# Patient Record
Sex: Male | Born: 1940 | Race: White | Marital: Married | Smoking: Former smoker
Health system: Southern US, Community
[De-identification: ages and names within clinical notes are randomized; demographics above are authoritative.]

## PROBLEM LIST (undated history)

## (undated) DIAGNOSIS — K2270 Barrett's esophagus without dysplasia: Secondary | ICD-10-CM

## (undated) DIAGNOSIS — D759 Disease of blood and blood-forming organs, unspecified: Secondary | ICD-10-CM

## (undated) DIAGNOSIS — M5136 Other intervertebral disc degeneration, lumbar region: Secondary | ICD-10-CM

## (undated) DIAGNOSIS — R519 Headache, unspecified: Secondary | ICD-10-CM

## (undated) DIAGNOSIS — E349 Endocrine disorder, unspecified: Secondary | ICD-10-CM

## (undated) DIAGNOSIS — R51 Headache: Secondary | ICD-10-CM

## (undated) DIAGNOSIS — R011 Cardiac murmur, unspecified: Secondary | ICD-10-CM

## (undated) DIAGNOSIS — I2510 Atherosclerotic heart disease of native coronary artery without angina pectoris: Secondary | ICD-10-CM

## (undated) DIAGNOSIS — R0989 Other specified symptoms and signs involving the circulatory and respiratory systems: Secondary | ICD-10-CM

## (undated) DIAGNOSIS — M1990 Unspecified osteoarthritis, unspecified site: Secondary | ICD-10-CM

## (undated) DIAGNOSIS — M75100 Unspecified rotator cuff tear or rupture of unspecified shoulder, not specified as traumatic: Secondary | ICD-10-CM

## (undated) DIAGNOSIS — K279 Peptic ulcer, site unspecified, unspecified as acute or chronic, without hemorrhage or perforation: Secondary | ICD-10-CM

## (undated) DIAGNOSIS — M51369 Other intervertebral disc degeneration, lumbar region without mention of lumbar back pain or lower extremity pain: Secondary | ICD-10-CM

## (undated) DIAGNOSIS — I10 Essential (primary) hypertension: Secondary | ICD-10-CM

## (undated) DIAGNOSIS — E785 Hyperlipidemia, unspecified: Secondary | ICD-10-CM

## (undated) DIAGNOSIS — K219 Gastro-esophageal reflux disease without esophagitis: Secondary | ICD-10-CM

## (undated) DIAGNOSIS — Z8719 Personal history of other diseases of the digestive system: Secondary | ICD-10-CM

## (undated) DIAGNOSIS — M5126 Other intervertebral disc displacement, lumbar region: Secondary | ICD-10-CM

## (undated) HISTORY — DX: Other intervertebral disc displacement, lumbar region: M51.26

## (undated) HISTORY — DX: Essential (primary) hypertension: I10

## (undated) HISTORY — DX: Other specified symptoms and signs involving the circulatory and respiratory systems: R09.89

## (undated) HISTORY — PX: BACK SURGERY: SHX140

## (undated) HISTORY — DX: Peptic ulcer, site unspecified, unspecified as acute or chronic, without hemorrhage or perforation: K27.9

## (undated) HISTORY — DX: Hyperlipidemia, unspecified: E78.5

## (undated) HISTORY — DX: Atherosclerotic heart disease of native coronary artery without angina pectoris: I25.10

## (undated) HISTORY — DX: Barrett's esophagus without dysplasia: K22.70

## (undated) HISTORY — PX: SPINAL FUSION: SHX223

## (undated) HISTORY — DX: Gastro-esophageal reflux disease without esophagitis: K21.9

## (undated) HISTORY — DX: Other intervertebral disc degeneration, lumbar region without mention of lumbar back pain or lower extremity pain: M51.369

## (undated) HISTORY — DX: Unspecified rotator cuff tear or rupture of unspecified shoulder, not specified as traumatic: M75.100

## (undated) HISTORY — DX: Other intervertebral disc degeneration, lumbar region: M51.36

## (undated) HISTORY — PX: ROTATOR CUFF REPAIR: SHX139

## (undated) HISTORY — DX: Endocrine disorder, unspecified: E34.9

## (undated) HISTORY — DX: Unspecified osteoarthritis, unspecified site: M19.90

## (undated) HISTORY — PX: JOINT REPLACEMENT: SHX530

## (undated) HISTORY — PX: CERVICAL FUSION: SHX112

---

## 1988-01-23 HISTORY — PX: KNEE SURGERY: SHX244

## 2002-04-22 ENCOUNTER — Inpatient Hospital Stay (HOSPITAL_COMMUNITY): Admission: RE | Admit: 2002-04-22 | Discharge: 2002-04-23 | Payer: Self-pay | Admitting: Neurosurgery

## 2002-09-22 ENCOUNTER — Encounter: Payer: Self-pay | Admitting: Gastroenterology

## 2002-09-22 DIAGNOSIS — Z9189 Other specified personal risk factors, not elsewhere classified: Secondary | ICD-10-CM

## 2003-02-25 ENCOUNTER — Inpatient Hospital Stay (HOSPITAL_COMMUNITY): Admission: RE | Admit: 2003-02-25 | Discharge: 2003-03-01 | Payer: Self-pay | Admitting: Neurosurgery

## 2003-06-08 ENCOUNTER — Observation Stay (HOSPITAL_COMMUNITY): Admission: RE | Admit: 2003-06-08 | Discharge: 2003-06-09 | Payer: Self-pay | Admitting: Orthopedic Surgery

## 2004-02-01 ENCOUNTER — Ambulatory Visit: Payer: Self-pay | Admitting: Internal Medicine

## 2004-04-03 ENCOUNTER — Ambulatory Visit: Payer: Self-pay | Admitting: Internal Medicine

## 2004-08-03 ENCOUNTER — Ambulatory Visit: Payer: Self-pay | Admitting: Internal Medicine

## 2004-11-07 ENCOUNTER — Ambulatory Visit: Payer: Self-pay | Admitting: Internal Medicine

## 2004-11-27 ENCOUNTER — Ambulatory Visit: Payer: Self-pay | Admitting: Internal Medicine

## 2005-03-26 ENCOUNTER — Ambulatory Visit: Payer: Self-pay | Admitting: Internal Medicine

## 2005-04-02 ENCOUNTER — Ambulatory Visit: Payer: Self-pay | Admitting: Internal Medicine

## 2005-07-26 ENCOUNTER — Ambulatory Visit: Payer: Self-pay | Admitting: Internal Medicine

## 2006-01-28 ENCOUNTER — Ambulatory Visit: Payer: Self-pay | Admitting: Internal Medicine

## 2006-01-28 LAB — CONVERTED CEMR LAB
ALT: 31 units/L (ref 0–40)
Creatinine, Ser: 1 mg/dL (ref 0.4–1.5)
HDL: 38.6 mg/dL — ABNORMAL LOW (ref >39.0)
LDL Cholesterol: 71 mg/dL (ref 0–99)
Triglyceride fasting, serum: 129 mg/dL (ref 0–149)
VLDL: 26 mg/dL (ref 0–40)

## 2006-05-03 DIAGNOSIS — I2510 Atherosclerotic heart disease of native coronary artery without angina pectoris: Secondary | ICD-10-CM

## 2006-05-03 DIAGNOSIS — E785 Hyperlipidemia, unspecified: Secondary | ICD-10-CM | POA: Insufficient documentation

## 2006-05-03 DIAGNOSIS — I10 Essential (primary) hypertension: Secondary | ICD-10-CM

## 2006-05-03 HISTORY — DX: Atherosclerotic heart disease of native coronary artery without angina pectoris: I25.10

## 2006-05-03 HISTORY — DX: Essential (primary) hypertension: I10

## 2006-05-03 HISTORY — DX: Hyperlipidemia, unspecified: E78.5

## 2006-07-22 ENCOUNTER — Ambulatory Visit: Payer: Self-pay | Admitting: Internal Medicine

## 2006-07-22 DIAGNOSIS — R5381 Other malaise: Secondary | ICD-10-CM

## 2006-07-22 DIAGNOSIS — M549 Dorsalgia, unspecified: Secondary | ICD-10-CM

## 2006-07-22 DIAGNOSIS — R5383 Other fatigue: Secondary | ICD-10-CM

## 2006-07-22 LAB — CONVERTED CEMR LAB
Bilirubin Urine: NEGATIVE
Ketones, urine, test strip: NEGATIVE
Nitrite: NEGATIVE
Protein, U semiquant: NEGATIVE
Urobilinogen, UA: NEGATIVE

## 2006-07-23 ENCOUNTER — Telehealth (INDEPENDENT_AMBULATORY_CARE_PROVIDER_SITE_OTHER): Payer: Self-pay | Admitting: *Deleted

## 2006-07-25 ENCOUNTER — Encounter: Admission: RE | Admit: 2006-07-25 | Discharge: 2006-07-25 | Payer: Self-pay | Admitting: Internal Medicine

## 2006-07-29 LAB — CONVERTED CEMR LAB
ALT: 27 units/L (ref 0–53)
AST: 31 units/L (ref 0–37)
Folate: 15.6 ng/mL
PSA: 0.6 ng/mL (ref 0.10–4.00)
TSH: 1.34 microintl units/mL (ref 0.35–5.50)
Vitamin B-12: 256 pg/mL (ref 211–911)

## 2006-08-16 ENCOUNTER — Encounter: Admission: RE | Admit: 2006-08-16 | Discharge: 2006-08-16 | Payer: Self-pay | Admitting: Neurosurgery

## 2006-08-21 ENCOUNTER — Encounter: Payer: Self-pay | Admitting: Internal Medicine

## 2006-11-29 ENCOUNTER — Encounter: Payer: Self-pay | Admitting: Internal Medicine

## 2006-12-04 ENCOUNTER — Encounter: Payer: Self-pay | Admitting: Internal Medicine

## 2007-01-20 ENCOUNTER — Ambulatory Visit: Payer: Self-pay | Admitting: Internal Medicine

## 2007-01-21 LAB — CONVERTED CEMR LAB
ALT: 27 units/L (ref 0–53)
AST: 19 units/L (ref 0–37)
BUN: 18 mg/dL (ref 6–23)
CO2: 30 meq/L (ref 19–32)
Calcium: 9.8 mg/dL (ref 8.4–10.5)
Creatinine, Ser: 1 mg/dL (ref 0.4–1.5)
Potassium: 4.4 meq/L (ref 3.5–5.1)
Triglycerides: 83 mg/dL (ref 0–149)
VLDL: 17 mg/dL (ref 0–40)

## 2007-03-25 ENCOUNTER — Telehealth (INDEPENDENT_AMBULATORY_CARE_PROVIDER_SITE_OTHER): Payer: Self-pay | Admitting: *Deleted

## 2007-05-30 ENCOUNTER — Encounter: Payer: Self-pay | Admitting: Internal Medicine

## 2007-07-07 ENCOUNTER — Ambulatory Visit: Payer: Self-pay | Admitting: Internal Medicine

## 2007-07-07 DIAGNOSIS — R319 Hematuria, unspecified: Secondary | ICD-10-CM

## 2007-07-09 ENCOUNTER — Encounter (INDEPENDENT_AMBULATORY_CARE_PROVIDER_SITE_OTHER): Payer: Self-pay | Admitting: *Deleted

## 2007-07-10 ENCOUNTER — Encounter: Payer: Self-pay | Admitting: Internal Medicine

## 2007-08-25 ENCOUNTER — Ambulatory Visit: Payer: Self-pay | Admitting: Gastroenterology

## 2007-08-29 ENCOUNTER — Telehealth: Payer: Self-pay | Admitting: Gastroenterology

## 2008-01-06 ENCOUNTER — Ambulatory Visit: Payer: Self-pay | Admitting: Internal Medicine

## 2008-01-09 LAB — CONVERTED CEMR LAB
Cholesterol: 129 mg/dL (ref 0–200)
Direct LDL: 57.2 mg/dL
Total CHOL/HDL Ratio: 4.7
Triglycerides: 219 mg/dL (ref 0–149)

## 2008-03-23 ENCOUNTER — Telehealth (INDEPENDENT_AMBULATORY_CARE_PROVIDER_SITE_OTHER): Payer: Self-pay | Admitting: *Deleted

## 2008-04-05 ENCOUNTER — Ambulatory Visit: Payer: Self-pay | Admitting: Internal Medicine

## 2008-04-05 DIAGNOSIS — M25519 Pain in unspecified shoulder: Secondary | ICD-10-CM | POA: Insufficient documentation

## 2008-05-31 ENCOUNTER — Ambulatory Visit: Payer: Self-pay | Admitting: Gastroenterology

## 2008-06-09 ENCOUNTER — Encounter: Payer: Self-pay | Admitting: Gastroenterology

## 2008-06-09 ENCOUNTER — Ambulatory Visit: Payer: Self-pay | Admitting: Gastroenterology

## 2008-06-11 ENCOUNTER — Encounter: Payer: Self-pay | Admitting: Gastroenterology

## 2008-06-17 ENCOUNTER — Ambulatory Visit: Payer: Self-pay | Admitting: Gastroenterology

## 2008-07-19 ENCOUNTER — Ambulatory Visit: Payer: Self-pay | Admitting: Gastroenterology

## 2008-07-22 ENCOUNTER — Ambulatory Visit: Payer: Self-pay | Admitting: Internal Medicine

## 2008-07-22 DIAGNOSIS — K279 Peptic ulcer, site unspecified, unspecified as acute or chronic, without hemorrhage or perforation: Secondary | ICD-10-CM

## 2008-07-22 HISTORY — DX: Peptic ulcer, site unspecified, unspecified as acute or chronic, without hemorrhage or perforation: K27.9

## 2008-07-27 ENCOUNTER — Telehealth (INDEPENDENT_AMBULATORY_CARE_PROVIDER_SITE_OTHER): Payer: Self-pay | Admitting: *Deleted

## 2008-08-13 DIAGNOSIS — K5730 Diverticulosis of large intestine without perforation or abscess without bleeding: Secondary | ICD-10-CM | POA: Insufficient documentation

## 2008-08-18 ENCOUNTER — Ambulatory Visit: Payer: Self-pay | Admitting: Gastroenterology

## 2008-08-18 DIAGNOSIS — K2270 Barrett's esophagus without dysplasia: Secondary | ICD-10-CM

## 2008-08-18 HISTORY — DX: Barrett's esophagus without dysplasia: K22.70

## 2008-08-19 ENCOUNTER — Encounter: Payer: Self-pay | Admitting: Gastroenterology

## 2008-08-19 ENCOUNTER — Telehealth: Payer: Self-pay | Admitting: Gastroenterology

## 2008-08-26 ENCOUNTER — Ambulatory Visit: Payer: Self-pay | Admitting: Internal Medicine

## 2008-08-27 LAB — CONVERTED CEMR LAB
Basophils Relative: 0.1 % (ref 0.0–3.0)
CO2: 26 meq/L (ref 19–32)
Calcium: 9.8 mg/dL (ref 8.4–10.5)
Chloride: 103 meq/L (ref 96–112)
Eosinophils Absolute: 0.1 10*3/uL (ref 0.0–0.7)
HCT: 34.2 % — ABNORMAL LOW (ref 39.0–52.0)
Hemoglobin: 12 g/dL — ABNORMAL LOW (ref 13.0–17.0)
Lymphocytes Relative: 37.5 % (ref 12.0–46.0)
Lymphs Abs: 1.7 10*3/uL (ref 0.7–4.0)
MCHC: 35.2 g/dL (ref 30.0–36.0)
Neutro Abs: 2.4 10*3/uL (ref 1.4–7.7)
Potassium: 3.9 meq/L (ref 3.5–5.1)
RBC: 3.52 M/uL — ABNORMAL LOW (ref 4.22–5.81)
Sodium: 142 meq/L (ref 135–145)

## 2008-09-06 ENCOUNTER — Telehealth: Payer: Self-pay | Admitting: Gastroenterology

## 2008-10-04 ENCOUNTER — Ambulatory Visit: Payer: Self-pay | Admitting: Gastroenterology

## 2008-10-07 ENCOUNTER — Encounter: Payer: Self-pay | Admitting: Internal Medicine

## 2008-10-07 ENCOUNTER — Ambulatory Visit: Payer: Self-pay | Admitting: Internal Medicine

## 2009-01-04 ENCOUNTER — Ambulatory Visit: Payer: Self-pay | Admitting: Internal Medicine

## 2009-04-04 ENCOUNTER — Telehealth (INDEPENDENT_AMBULATORY_CARE_PROVIDER_SITE_OTHER): Payer: Self-pay | Admitting: *Deleted

## 2009-04-06 ENCOUNTER — Ambulatory Visit: Payer: Self-pay | Admitting: Internal Medicine

## 2009-04-12 ENCOUNTER — Encounter: Admission: RE | Admit: 2009-04-12 | Discharge: 2009-04-12 | Payer: Self-pay | Admitting: Orthopedic Surgery

## 2009-04-13 ENCOUNTER — Ambulatory Visit: Payer: Self-pay | Admitting: Hematology & Oncology

## 2009-04-14 ENCOUNTER — Ambulatory Visit (HOSPITAL_BASED_OUTPATIENT_CLINIC_OR_DEPARTMENT_OTHER): Admission: RE | Admit: 2009-04-14 | Discharge: 2009-04-14 | Payer: Self-pay | Admitting: Hematology & Oncology

## 2009-04-14 ENCOUNTER — Encounter: Payer: Self-pay | Admitting: Internal Medicine

## 2009-04-14 ENCOUNTER — Ambulatory Visit: Payer: Self-pay | Admitting: Diagnostic Radiology

## 2009-04-14 ENCOUNTER — Ambulatory Visit: Payer: Self-pay

## 2009-04-14 DIAGNOSIS — R0989 Other specified symptoms and signs involving the circulatory and respiratory systems: Secondary | ICD-10-CM

## 2009-04-14 LAB — CONVERTED CEMR LAB
ALT: 33 units/L (ref 0–53)
AST: 34 units/L (ref 0–37)
BUN: 19 mg/dL (ref 6–23)
Basophils Absolute: 0 10*3/uL (ref 0.0–0.1)
Bilirubin, Direct: 0.1 mg/dL (ref 0.0–0.3)
Chloride: 107 meq/L (ref 96–112)
GFR calc non Af Amer: 88.52 mL/min (ref >60)
Glucose, Bld: 103 mg/dL — ABNORMAL HIGH (ref 70–99)
HCT: 33.5 % — ABNORMAL LOW (ref 39.0–52.0)
Lymphs Abs: 1.6 10*3/uL (ref 0.7–4.0)
MCV: 100.9 fL — ABNORMAL HIGH (ref 78.0–100.0)
Monocytes Absolute: 0.5 10*3/uL (ref 0.1–1.0)
Platelets: 183 10*3/uL (ref 150.0–400.0)
Potassium: 4.7 meq/L (ref 3.5–5.1)
RDW: 13.7 % (ref 11.5–14.6)
TSH: 1.3 microintl units/mL (ref 0.35–5.50)
Total Bilirubin: 0.2 mg/dL — ABNORMAL LOW (ref 0.3–1.2)

## 2009-04-14 LAB — CBC WITH DIFFERENTIAL (CANCER CENTER ONLY)
BASO%: 0.5 % (ref 0.0–2.0)
Eosinophils Absolute: 0.1 10*3/uL (ref 0.0–0.5)
MONO#: 0.3 10*3/uL (ref 0.1–0.9)
MONO%: 7.7 % (ref 0.0–13.0)
NEUT#: 2.3 10*3/uL (ref 1.5–6.5)
Platelets: 203 10*3/uL (ref 145–400)
RBC: 3.4 10*6/uL — ABNORMAL LOW (ref 4.20–5.70)
RDW: 11.1 % (ref 10.5–14.6)
WBC: 4.2 10*3/uL (ref 4.0–10.0)

## 2009-04-14 LAB — CHCC SATELLITE - SMEAR

## 2009-04-15 ENCOUNTER — Telehealth: Payer: Self-pay | Admitting: Internal Medicine

## 2009-04-15 DIAGNOSIS — C9000 Multiple myeloma not having achieved remission: Secondary | ICD-10-CM

## 2009-04-18 LAB — COMPREHENSIVE METABOLIC PANEL
ALT: 26 U/L (ref 0–53)
AST: 25 U/L (ref 0–37)
CO2: 23 mEq/L (ref 19–32)
Chloride: 103 mEq/L (ref 96–112)
Creatinine, Ser: 0.93 mg/dL (ref 0.40–1.50)
Sodium: 139 mEq/L (ref 135–145)
Total Bilirubin: 0.3 mg/dL (ref 0.3–1.2)
Total Protein: 8.7 g/dL — ABNORMAL HIGH (ref 6.0–8.3)

## 2009-04-18 LAB — LACTATE DEHYDROGENASE: LDH: 106 U/L (ref 94–250)

## 2009-04-18 LAB — KAPPA/LAMBDA LIGHT CHAINS
Kappa:Lambda Ratio: 6.71 — ABNORMAL HIGH (ref 0.26–1.65)
Lambda Free Lght Chn: 0.75 mg/dL (ref 0.57–2.63)

## 2009-04-18 LAB — SPEP & IFE WITH QIG
Alpha-1-Globulin: 3.9 % (ref 2.9–4.9)
Beta 2: 34.8 % — ABNORMAL HIGH (ref 3.2–6.5)
Gamma Globulin: 7.6 % — ABNORMAL LOW (ref 11.1–18.8)
IgA: 4480 mg/dL — ABNORMAL HIGH (ref 68–378)
IgG (Immunoglobin G), Serum: 330 mg/dL — ABNORMAL LOW (ref 694–1618)
IgM, Serum: 9 mg/dL — ABNORMAL LOW (ref 60–263)
M-Spike, %: 2.73 g/dL

## 2009-04-19 ENCOUNTER — Ambulatory Visit (HOSPITAL_COMMUNITY): Admission: RE | Admit: 2009-04-19 | Discharge: 2009-04-19 | Payer: Self-pay | Admitting: Hematology & Oncology

## 2009-04-19 ENCOUNTER — Ambulatory Visit: Payer: Self-pay | Admitting: Hematology & Oncology

## 2009-04-19 ENCOUNTER — Encounter: Payer: Self-pay | Admitting: Internal Medicine

## 2009-04-22 LAB — UIFE/LIGHT CHAINS/TP QN, 24-HR UR
Alpha 2, Urine: DETECTED — AB
Beta, Urine: DETECTED — AB
Free Kappa Lt Chains,Ur: 3.61 mg/dL — ABNORMAL HIGH (ref 0.04–1.51)
Free Lambda Lt Chains,Ur: <0.05 mg/dL — ABNORMAL LOW (ref 0.08–1.01)

## 2009-04-28 LAB — BASIC METABOLIC PANEL
CO2: 23 mEq/L (ref 19–32)
Chloride: 102 mEq/L (ref 96–112)
Creatinine, Ser: 0.89 mg/dL (ref 0.40–1.50)
Potassium: 4.2 mEq/L (ref 3.5–5.3)

## 2009-05-02 ENCOUNTER — Ambulatory Visit (HOSPITAL_BASED_OUTPATIENT_CLINIC_OR_DEPARTMENT_OTHER): Admission: RE | Admit: 2009-05-02 | Discharge: 2009-05-02 | Payer: Self-pay | Admitting: Hematology & Oncology

## 2009-05-02 ENCOUNTER — Ambulatory Visit: Payer: Self-pay | Admitting: Interventional Radiology

## 2009-05-05 LAB — CBC WITH DIFFERENTIAL (CANCER CENTER ONLY)
BASO#: 0.1 10*3/uL (ref 0.0–0.2)
EOS%: 3.1 % (ref 0.0–7.0)
HCT: 31.3 % — ABNORMAL LOW (ref 38.7–49.9)
HGB: 10.6 g/dL — ABNORMAL LOW (ref 13.0–17.1)
LYMPH%: 33.6 % (ref 14.0–48.0)
MCH: 33.1 pg (ref 28.0–33.4)
MCHC: 33.7 g/dL (ref 32.0–35.9)
MCV: 98 fL (ref 82–98)
MONO%: 10.2 % (ref 0.0–13.0)
NEUT#: 2.9 10*3/uL (ref 1.5–6.5)
NEUT%: 52.2 % (ref 40.0–80.0)

## 2009-05-12 LAB — CBC WITH DIFFERENTIAL (CANCER CENTER ONLY)
EOS%: 3.8 % (ref 0.0–7.0)
LYMPH%: 18.2 % (ref 14.0–48.0)
MCH: 33.2 pg (ref 28.0–33.4)
MCHC: 34 g/dL (ref 32.0–35.9)
MCV: 98 fL (ref 82–98)
MONO%: 7.5 % (ref 0.0–13.0)
NEUT#: 4.9 10*3/uL (ref 1.5–6.5)
Platelets: 224 10*3/uL (ref 145–400)
RDW: 11.2 % (ref 10.5–14.6)

## 2009-05-25 ENCOUNTER — Ambulatory Visit: Payer: Self-pay | Admitting: Hematology & Oncology

## 2009-05-26 LAB — CBC WITH DIFFERENTIAL (CANCER CENTER ONLY)
BASO%: 0.5 % (ref 0.0–2.0)
EOS%: 3.5 % (ref 0.0–7.0)
HCT: 31.4 % — ABNORMAL LOW (ref 38.7–49.9)
LYMPH%: 29.2 % (ref 14.0–48.0)
MCHC: 34.3 g/dL (ref 32.0–35.9)
MCV: 97 fL (ref 82–98)
NEUT%: 53.7 % (ref 40.0–80.0)
Platelets: 145 10*3/uL (ref 145–400)
RDW: 11.7 % (ref 10.5–14.6)

## 2009-05-30 LAB — IGG, IGA, IGM
IgG (Immunoglobin G), Serum: 319 mg/dL — ABNORMAL LOW (ref 694–1618)
IgM, Serum: 10 mg/dL — ABNORMAL LOW (ref 60–263)

## 2009-05-30 LAB — BETA 2 MICROGLOBULIN, SERUM: Beta-2 Microglobulin: 2.27 mg/L — ABNORMAL HIGH (ref 1.01–1.73)

## 2009-05-30 LAB — PROTEIN ELECTROPHORESIS, SERUM
Albumin ELP: 45.7 % — ABNORMAL LOW (ref 55.8–66.1)
Beta 2: 27.4 % — ABNORMAL HIGH (ref 3.2–6.5)
Gamma Globulin: 6.8 % — ABNORMAL LOW (ref 11.1–18.8)

## 2009-05-30 LAB — COMPREHENSIVE METABOLIC PANEL
AST: 23 U/L (ref 0–37)
Albumin: 3.9 g/dL (ref 3.5–5.2)
Alkaline Phosphatase: 62 U/L (ref 39–117)
BUN: 13 mg/dL (ref 6–23)
Creatinine, Ser: 0.85 mg/dL (ref 0.40–1.50)
Glucose, Bld: 103 mg/dL — ABNORMAL HIGH (ref 70–99)
Potassium: 4.2 mEq/L (ref 3.5–5.3)

## 2009-05-30 LAB — KAPPA/LAMBDA LIGHT CHAINS
Kappa:Lambda Ratio: 6.91 — ABNORMAL HIGH (ref 0.26–1.65)
Lambda Free Lght Chn: 0.68 mg/dL (ref 0.57–2.63)

## 2009-06-02 LAB — CBC WITH DIFFERENTIAL (CANCER CENTER ONLY)
BASO%: 0.7 % (ref 0.0–2.0)
EOS%: 3 % (ref 0.0–7.0)
LYMPH#: 1.3 10*3/uL (ref 0.9–3.3)
MCHC: 33.5 g/dL (ref 32.0–35.9)
NEUT#: 1.5 10*3/uL (ref 1.5–6.5)
Platelets: 180 10*3/uL (ref 145–400)
RDW: 11.5 % (ref 10.5–14.6)

## 2009-06-09 LAB — CBC WITH DIFFERENTIAL (CANCER CENTER ONLY)
BASO#: 0 10*3/uL (ref 0.0–0.2)
EOS%: 4.1 % (ref 0.0–7.0)
Eosinophils Absolute: 0.2 10*3/uL (ref 0.0–0.5)
HGB: 11 g/dL — ABNORMAL LOW (ref 13.0–17.1)
LYMPH#: 1.3 10*3/uL (ref 0.9–3.3)
LYMPH%: 31.5 % (ref 14.0–48.0)
MCHC: 34.2 g/dL (ref 32.0–35.9)
MCV: 97 fL (ref 82–98)
MONO#: 0.3 10*3/uL (ref 0.1–0.9)
NEUT#: 2.2 10*3/uL (ref 1.5–6.5)
NEUT%: 55.5 % (ref 40.0–80.0)
RBC: 3.3 10*6/uL — ABNORMAL LOW (ref 4.20–5.70)
WBC: 4 10*3/uL (ref 4.0–10.0)

## 2009-06-23 LAB — CBC WITH DIFFERENTIAL (CANCER CENTER ONLY)
BASO#: 0 10*3/uL (ref 0.0–0.2)
BASO%: 0.5 % (ref 0.0–2.0)
EOS%: 5.6 % (ref 0.0–7.0)
HGB: 10.9 g/dL — ABNORMAL LOW (ref 13.0–17.1)
LYMPH#: 1.2 10*3/uL (ref 0.9–3.3)
MCH: 32.9 pg (ref 28.0–33.4)
MCHC: 34.1 g/dL (ref 32.0–35.9)
MONO%: 14 % — ABNORMAL HIGH (ref 0.0–13.0)
NEUT#: 1.8 10*3/uL (ref 1.5–6.5)
Platelets: 120 10*3/uL — ABNORMAL LOW (ref 145–400)
RDW: 12.1 % (ref 10.5–14.6)

## 2009-06-27 LAB — COMPREHENSIVE METABOLIC PANEL
Alkaline Phosphatase: 52 U/L (ref 39–117)
BUN: 15 mg/dL (ref 6–23)
Creatinine, Ser: 0.9 mg/dL (ref 0.40–1.50)
Glucose, Bld: 91 mg/dL (ref 70–99)
Total Bilirubin: 0.5 mg/dL (ref 0.3–1.2)

## 2009-06-27 LAB — SPEP & IFE WITH QIG
Alpha-2-Globulin: 9.8 % (ref 7.1–11.8)
Beta Globulin: 5.7 % (ref 4.7–7.2)
Gamma Globulin: 7 % — ABNORMAL LOW (ref 11.1–18.8)
IgG (Immunoglobin G), Serum: 417 mg/dL — ABNORMAL LOW (ref 694–1618)
M-Spike, %: 0.99 g/dL

## 2009-06-27 LAB — KAPPA/LAMBDA LIGHT CHAINS
Kappa free light chain: 3.61 mg/dL — ABNORMAL HIGH (ref 0.33–1.94)
Lambda Free Lght Chn: <0.42 mg/dL — ABNORMAL LOW (ref 0.57–2.63)

## 2009-06-27 LAB — BETA 2 MICROGLOBULIN, SERUM: Beta-2 Microglobulin: 2.41 mg/L — ABNORMAL HIGH (ref 1.01–1.73)

## 2009-06-28 ENCOUNTER — Ambulatory Visit: Payer: Self-pay | Admitting: Hematology & Oncology

## 2009-06-30 LAB — CBC WITH DIFFERENTIAL (CANCER CENTER ONLY)
Eosinophils Absolute: 0.1 10*3/uL (ref 0.0–0.5)
LYMPH%: 48.5 % — ABNORMAL HIGH (ref 14.0–48.0)
MCH: 32.8 pg (ref 28.0–33.4)
MCV: 96 fL (ref 82–98)
MONO%: 15.9 % — ABNORMAL HIGH (ref 0.0–13.0)
NEUT%: 32.2 % — ABNORMAL LOW (ref 40.0–80.0)
Platelets: 140 10*3/uL — ABNORMAL LOW (ref 145–400)
RBC: 3.3 10*6/uL — ABNORMAL LOW (ref 4.20–5.70)
RDW: 12.1 % (ref 10.5–14.6)

## 2009-07-07 LAB — BASIC METABOLIC PANEL
BUN: 10 mg/dL (ref 6–23)
CO2: 25 mEq/L (ref 19–32)
Chloride: 105 mEq/L (ref 96–112)
Glucose, Bld: 113 mg/dL — ABNORMAL HIGH (ref 70–99)
Potassium: 4.1 mEq/L (ref 3.5–5.3)

## 2009-07-07 LAB — CBC WITH DIFFERENTIAL (CANCER CENTER ONLY)
BASO#: 0 10*3/uL (ref 0.0–0.2)
Eosinophils Absolute: 0.1 10*3/uL (ref 0.0–0.5)
HCT: 32.5 % — ABNORMAL LOW (ref 38.7–49.9)
HGB: 11 g/dL — ABNORMAL LOW (ref 13.0–17.1)
LYMPH#: 1 10*3/uL (ref 0.9–3.3)
NEUT#: 2 10*3/uL (ref 1.5–6.5)
NEUT%: 58.4 % (ref 40.0–80.0)
WBC: 3.4 10*3/uL — ABNORMAL LOW (ref 4.0–10.0)

## 2009-07-21 ENCOUNTER — Encounter: Payer: Self-pay | Admitting: Internal Medicine

## 2009-07-21 LAB — CMP (CANCER CENTER ONLY)
Alkaline Phosphatase: 62 U/L (ref 26–84)
Glucose, Bld: 114 mg/dL (ref 73–118)
Sodium: 139 mEq/L (ref 128–145)
Total Bilirubin: 0.5 mg/dl (ref 0.20–1.60)
Total Protein: 7.7 g/dL (ref 6.4–8.1)

## 2009-07-21 LAB — CBC WITH DIFFERENTIAL (CANCER CENTER ONLY)
BASO%: 0.8 % (ref 0.0–2.0)
EOS%: 3.5 % (ref 0.0–7.0)
Eosinophils Absolute: 0.1 10*3/uL (ref 0.0–0.5)
LYMPH%: 43.8 % (ref 14.0–48.0)
MCH: 32.3 pg (ref 28.0–33.4)
MCHC: 33.3 g/dL (ref 32.0–35.9)
MCV: 97 fL (ref 82–98)
MONO%: 14 % — ABNORMAL HIGH (ref 0.0–13.0)
Platelets: 136 10*3/uL — ABNORMAL LOW (ref 145–400)
RDW: 12.6 % (ref 10.5–14.6)

## 2009-07-27 ENCOUNTER — Ambulatory Visit: Payer: Self-pay | Admitting: Hematology & Oncology

## 2009-07-28 LAB — BETA 2 MICROGLOBULIN, SERUM: Beta-2 Microglobulin: 1.98 mg/L — ABNORMAL HIGH (ref 1.01–1.73)

## 2009-07-28 LAB — SPEP & IFE WITH QIG
Alpha-2-Globulin: 10.5 % (ref 7.1–11.8)
Beta 2: 18.4 % — ABNORMAL HIGH (ref 3.2–6.5)
Beta Globulin: 5.8 % (ref 4.7–7.2)
Gamma Globulin: 7.4 % — ABNORMAL LOW (ref 11.1–18.8)
IgA: 1630 mg/dL — ABNORMAL HIGH (ref 68–378)
IgG (Immunoglobin G), Serum: 491 mg/dL — ABNORMAL LOW (ref 694–1618)

## 2009-07-28 LAB — KAPPA/LAMBDA LIGHT CHAINS
Kappa free light chain: 3.15 mg/dL — ABNORMAL HIGH (ref 0.33–1.94)
Lambda Free Lght Chn: <0.42 mg/dL — ABNORMAL LOW (ref 0.57–2.63)

## 2009-07-28 LAB — CBC WITH DIFFERENTIAL (CANCER CENTER ONLY)
BASO%: 0.5 % (ref 0.0–2.0)
HCT: 34.9 % — ABNORMAL LOW (ref 38.7–49.9)
LYMPH%: 51.8 % — ABNORMAL HIGH (ref 14.0–48.0)
MCV: 96 fL (ref 82–98)
MONO#: 0.5 10*3/uL (ref 0.1–0.9)
NEUT%: 31.4 % — ABNORMAL LOW (ref 40.0–80.0)
RDW: 13.1 % (ref 10.5–14.6)
WBC: 3.5 10*3/uL — ABNORMAL LOW (ref 4.0–10.0)

## 2009-08-03 LAB — CBC WITH DIFFERENTIAL (CANCER CENTER ONLY)
BASO%: 1 % (ref 0.0–2.0)
EOS%: 4.1 % (ref 0.0–7.0)
HCT: 33.9 % — ABNORMAL LOW (ref 38.7–49.9)
LYMPH#: 1.2 10*3/uL (ref 0.9–3.3)
LYMPH%: 37.4 % (ref 14.0–48.0)
MCHC: 33.9 g/dL (ref 32.0–35.9)
MCV: 96 fL (ref 82–98)
MONO%: 9.8 % (ref 0.0–13.0)
NEUT%: 47.7 % (ref 40.0–80.0)
RDW: 12.8 % (ref 10.5–14.6)

## 2009-08-08 ENCOUNTER — Ambulatory Visit: Payer: Self-pay | Admitting: Internal Medicine

## 2009-08-18 ENCOUNTER — Encounter: Payer: Self-pay | Admitting: Internal Medicine

## 2009-08-18 LAB — CBC WITH DIFFERENTIAL (CANCER CENTER ONLY)
BASO%: 1 % (ref 0.0–2.0)
EOS%: 7 % (ref 0.0–7.0)
HCT: 34.3 % — ABNORMAL LOW (ref 38.7–49.9)
LYMPH#: 1.1 10*3/uL (ref 0.9–3.3)
MCH: 32.2 pg (ref 28.0–33.4)
MCHC: 33.6 g/dL (ref 32.0–35.9)
MONO%: 14.3 % — ABNORMAL HIGH (ref 0.0–13.0)
NEUT%: 44.1 % (ref 40.0–80.0)
RDW: 13.4 % (ref 10.5–14.6)

## 2009-08-22 LAB — BETA 2 MICROGLOBULIN, SERUM: Beta-2 Microglobulin: 2.29 mg/L — ABNORMAL HIGH (ref 1.01–1.73)

## 2009-08-22 LAB — SPEP & IFE WITH QIG
Beta 2: 15.8 % — ABNORMAL HIGH (ref 3.2–6.5)
Beta Globulin: 6.1 % (ref 4.7–7.2)
Gamma Globulin: 7.5 % — ABNORMAL LOW (ref 11.1–18.8)
IgA: 1220 mg/dL — ABNORMAL HIGH (ref 68–378)
IgM, Serum: 18 mg/dL — ABNORMAL LOW (ref 60–263)

## 2009-08-22 LAB — KAPPA/LAMBDA LIGHT CHAINS

## 2009-08-22 LAB — COMPREHENSIVE METABOLIC PANEL
ALT: 22 U/L (ref 0–53)
AST: 23 U/L (ref 0–37)
Alkaline Phosphatase: 39 U/L (ref 39–117)
Creatinine, Ser: 0.9 mg/dL (ref 0.40–1.50)
Total Bilirubin: 0.4 mg/dL (ref 0.3–1.2)

## 2009-08-25 LAB — CBC WITH DIFFERENTIAL (CANCER CENTER ONLY)
BASO#: 0 10*3/uL (ref 0.0–0.2)
EOS%: 1.3 % (ref 0.0–7.0)
HGB: 12 g/dL — ABNORMAL LOW (ref 13.0–17.1)
LYMPH%: 41.4 % (ref 14.0–48.0)
MCH: 32.2 pg (ref 28.0–33.4)
MCHC: 33.8 g/dL (ref 32.0–35.9)
MCV: 95 fL (ref 82–98)
MONO%: 15.4 % — ABNORMAL HIGH (ref 0.0–13.0)
NEUT#: 1.6 10*3/uL (ref 1.5–6.5)
NEUT%: 41.2 % (ref 40.0–80.0)

## 2009-08-31 ENCOUNTER — Ambulatory Visit: Payer: Self-pay | Admitting: Hematology & Oncology

## 2009-09-01 LAB — CBC WITH DIFFERENTIAL (CANCER CENTER ONLY)
BASO%: 0.6 % (ref 0.0–2.0)
EOS%: 2.1 % (ref 0.0–7.0)
LYMPH%: 34.9 % (ref 14.0–48.0)
MCH: 32.6 pg (ref 28.0–33.4)
MCHC: 33.8 g/dL (ref 32.0–35.9)
MONO%: 9.6 % (ref 0.0–13.0)
NEUT#: 2 10*3/uL (ref 1.5–6.5)
Platelets: 184 10*3/uL (ref 145–400)
RBC: 3.73 10*6/uL — ABNORMAL LOW (ref 4.20–5.70)
RDW: 13.4 % (ref 10.5–14.6)

## 2009-09-15 ENCOUNTER — Encounter: Payer: Self-pay | Admitting: Internal Medicine

## 2009-09-15 ENCOUNTER — Ambulatory Visit (HOSPITAL_BASED_OUTPATIENT_CLINIC_OR_DEPARTMENT_OTHER): Admission: RE | Admit: 2009-09-15 | Discharge: 2009-09-15 | Payer: Self-pay | Admitting: Hematology & Oncology

## 2009-09-15 ENCOUNTER — Ambulatory Visit: Payer: Self-pay | Admitting: Diagnostic Radiology

## 2009-09-15 LAB — CBC WITH DIFFERENTIAL (CANCER CENTER ONLY)
BASO%: 1.1 % (ref 0.0–2.0)
Eosinophils Absolute: 0.1 10*3/uL (ref 0.0–0.5)
MONO#: 0.4 10*3/uL (ref 0.1–0.9)
NEUT#: 1.4 10*3/uL — ABNORMAL LOW (ref 1.5–6.5)
Platelets: 121 10*3/uL — ABNORMAL LOW (ref 145–400)
RBC: 3.56 10*6/uL — ABNORMAL LOW (ref 4.20–5.70)
RDW: 13.1 % (ref 10.5–14.6)
WBC: 3 10*3/uL — ABNORMAL LOW (ref 4.0–10.0)

## 2009-09-16 ENCOUNTER — Telehealth: Payer: Self-pay | Admitting: Internal Medicine

## 2009-09-19 LAB — BETA 2 MICROGLOBULIN, SERUM: Beta-2 Microglobulin: 2.09 mg/L — ABNORMAL HIGH (ref 1.01–1.73)

## 2009-09-19 LAB — COMPREHENSIVE METABOLIC PANEL
ALT: 25 U/L (ref 0–53)
Albumin: 4.1 g/dL (ref 3.5–5.2)
Alkaline Phosphatase: 39 U/L (ref 39–117)
CO2: 26 mEq/L (ref 19–32)
Glucose, Bld: 98 mg/dL (ref 70–99)
Potassium: 4.6 mEq/L (ref 3.5–5.3)
Sodium: 140 mEq/L (ref 135–145)
Total Bilirubin: 0.4 mg/dL (ref 0.3–1.2)
Total Protein: 6.8 g/dL (ref 6.0–8.3)

## 2009-09-19 LAB — PROTEIN ELECTROPHORESIS, SERUM
Alpha-1-Globulin: 6.4 % — ABNORMAL HIGH (ref 2.9–4.9)
Alpha-2-Globulin: 9.8 % (ref 7.1–11.8)
Beta 2: 13.1 % — ABNORMAL HIGH (ref 3.2–6.5)
Beta Globulin: 6.5 % (ref 4.7–7.2)
Gamma Globulin: 7.5 % — ABNORMAL LOW (ref 11.1–18.8)

## 2009-09-19 LAB — KAPPA/LAMBDA LIGHT CHAINS: Lambda Free Lght Chn: 0.67 mg/dL (ref 0.57–2.63)

## 2009-09-20 DIAGNOSIS — Z9481 Bone marrow transplant status: Secondary | ICD-10-CM

## 2009-09-22 LAB — CBC WITH DIFFERENTIAL (CANCER CENTER ONLY)
BASO#: 0 10*3/uL (ref 0.0–0.2)
Eosinophils Absolute: 0.1 10*3/uL (ref 0.0–0.5)
HCT: 35.5 % — ABNORMAL LOW (ref 38.7–49.9)
HGB: 12 g/dL — ABNORMAL LOW (ref 13.0–17.1)
LYMPH%: 40.8 % (ref 14.0–48.0)
MCH: 31.9 pg (ref 28.0–33.4)
MCV: 94 fL (ref 82–98)
MONO#: 0.5 10*3/uL (ref 0.1–0.9)
MONO%: 14.1 % — ABNORMAL HIGH (ref 0.0–13.0)
RBC: 3.76 10*6/uL — ABNORMAL LOW (ref 4.20–5.70)
WBC: 3.4 10*3/uL — ABNORMAL LOW (ref 4.0–10.0)

## 2009-09-29 LAB — CBC WITH DIFFERENTIAL (CANCER CENTER ONLY)
BASO#: 0 10*3/uL (ref 0.0–0.2)
EOS%: 2.6 % (ref 0.0–7.0)
HCT: 35.2 % — ABNORMAL LOW (ref 38.7–49.9)
HGB: 11.8 g/dL — ABNORMAL LOW (ref 13.0–17.1)
LYMPH#: 1.2 10*3/uL (ref 0.9–3.3)
MCH: 31.9 pg (ref 28.0–33.4)
MCHC: 33.5 g/dL (ref 32.0–35.9)
NEUT%: 50.9 % (ref 40.0–80.0)

## 2009-10-17 ENCOUNTER — Ambulatory Visit: Payer: Self-pay | Admitting: Hematology & Oncology

## 2009-10-18 ENCOUNTER — Ambulatory Visit: Payer: Self-pay | Admitting: Hematology & Oncology

## 2009-10-18 ENCOUNTER — Ambulatory Visit (HOSPITAL_COMMUNITY): Admission: RE | Admit: 2009-10-18 | Discharge: 2009-10-18 | Payer: Self-pay | Admitting: Hematology & Oncology

## 2009-10-25 ENCOUNTER — Encounter: Payer: Self-pay | Admitting: Internal Medicine

## 2009-10-25 LAB — CBC WITH DIFFERENTIAL (CANCER CENTER ONLY)
Eosinophils Absolute: 0.1 10*3/uL (ref 0.0–0.5)
HCT: 35.5 % — ABNORMAL LOW (ref 38.7–49.9)
LYMPH%: 43 % (ref 14.0–48.0)
MCV: 96 fL (ref 82–98)
MONO#: 0.5 10*3/uL (ref 0.1–0.9)
Platelets: 206 10*3/uL (ref 145–400)
RBC: 3.71 10*6/uL — ABNORMAL LOW (ref 4.20–5.70)
WBC: 2.6 10*3/uL — ABNORMAL LOW (ref 4.0–10.0)

## 2009-10-27 LAB — SPEP & IFE WITH QIG
Albumin ELP: 56 % (ref 55.8–66.1)
Alpha-1-Globulin: 5.8 % — ABNORMAL HIGH (ref 2.9–4.9)
Alpha-2-Globulin: 9.5 % (ref 7.1–11.8)
Beta Globulin: 6.6 % (ref 4.7–7.2)
IgG (Immunoglobin G), Serum: 896 mg/dL (ref 694–1618)
Total Protein, Serum Electrophoresis: 6.8 g/dL (ref 6.0–8.3)

## 2009-10-27 LAB — KAPPA/LAMBDA LIGHT CHAINS
Kappa free light chain: 1.92 mg/dL (ref 0.33–1.94)
Lambda Free Lght Chn: 0.52 mg/dL — ABNORMAL LOW (ref 0.57–2.63)

## 2009-10-27 LAB — COMPREHENSIVE METABOLIC PANEL
Albumin: 4.3 g/dL (ref 3.5–5.2)
CO2: 24 mEq/L (ref 19–32)
Calcium: 8.7 mg/dL (ref 8.4–10.5)
Chloride: 104 mEq/L (ref 96–112)
Glucose, Bld: 100 mg/dL — ABNORMAL HIGH (ref 70–99)
Sodium: 140 mEq/L (ref 135–145)
Total Bilirubin: 0.4 mg/dL (ref 0.3–1.2)
Total Protein: 6.8 g/dL (ref 6.0–8.3)

## 2009-11-08 ENCOUNTER — Encounter: Payer: Self-pay | Admitting: Internal Medicine

## 2009-11-09 ENCOUNTER — Ambulatory Visit: Admission: RE | Admit: 2009-11-09 | Discharge: 2009-11-09 | Payer: Self-pay | Admitting: Hematology & Oncology

## 2009-11-09 ENCOUNTER — Ambulatory Visit: Payer: Self-pay | Admitting: Cardiology

## 2009-11-09 ENCOUNTER — Encounter: Payer: Self-pay | Admitting: Hematology & Oncology

## 2009-12-02 ENCOUNTER — Ambulatory Visit: Payer: Self-pay | Admitting: Hematology & Oncology

## 2009-12-05 LAB — CBC WITH DIFFERENTIAL (CANCER CENTER ONLY)
Eosinophils Absolute: 0.1 10*3/uL (ref 0.0–0.5)
LYMPH#: 0.6 10*3/uL — ABNORMAL LOW (ref 0.9–3.3)
MONO#: 0.1 10*3/uL (ref 0.1–0.9)
MONO%: 1.9 % (ref 0.0–13.0)
NEUT#: 5.8 10*3/uL (ref 1.5–6.5)
Platelets: 124 10*3/uL — ABNORMAL LOW (ref 145–400)
RBC: 3.82 10*6/uL — ABNORMAL LOW (ref 4.20–5.70)
WBC: 6.6 10*3/uL (ref 4.0–10.0)

## 2009-12-05 LAB — CMP (CANCER CENTER ONLY)
Albumin: 3.9 g/dL (ref 3.3–5.5)
Alkaline Phosphatase: 43 U/L (ref 26–84)
CO2: 31 mEq/L (ref 18–33)
Glucose, Bld: 105 mg/dL (ref 73–118)
Potassium: 5.2 mEq/L — ABNORMAL HIGH (ref 3.3–4.7)
Sodium: 144 mEq/L (ref 128–145)
Total Protein: 7.5 g/dL (ref 6.4–8.1)

## 2009-12-07 LAB — CMP (CANCER CENTER ONLY)
ALT(SGPT): 23 U/L (ref 10–47)
AST: 14 U/L (ref 11–38)
Albumin: 4 g/dL (ref 3.3–5.5)
Alkaline Phosphatase: 48 U/L (ref 26–84)
Glucose, Bld: 140 mg/dL — ABNORMAL HIGH (ref 73–118)
Potassium: 4.8 mEq/L — ABNORMAL HIGH (ref 3.3–4.7)
Sodium: 142 mEq/L (ref 128–145)
Total Protein: 7.7 g/dL (ref 6.4–8.1)

## 2009-12-07 LAB — CBC WITH DIFFERENTIAL (CANCER CENTER ONLY)
BASO%: 1.4 % (ref 0.0–2.0)
EOS%: 5.5 % (ref 0.0–7.0)
Eosinophils Absolute: 0 10*3/uL (ref 0.0–0.5)
MCH: 32.8 pg (ref 28.0–33.4)
MONO%: 6.7 % (ref 0.0–13.0)
NEUT#: 0.2 10*3/uL — CL (ref 1.5–6.5)
Platelets: 84 10*3/uL — ABNORMAL LOW (ref 145–400)
RBC: 3.82 10*6/uL — ABNORMAL LOW (ref 4.20–5.70)

## 2009-12-09 LAB — CBC WITH DIFFERENTIAL (CANCER CENTER ONLY)
HCT: 33.7 % — ABNORMAL LOW (ref 38.7–49.9)
HGB: 11.4 g/dL — ABNORMAL LOW (ref 13.0–17.1)
MCH: 32.4 pg (ref 28.0–33.4)
MCHC: 33.8 g/dL (ref 32.0–35.9)

## 2009-12-09 LAB — CMP (CANCER CENTER ONLY)
Albumin: 3.7 g/dL (ref 3.3–5.5)
Alkaline Phosphatase: 39 U/L (ref 26–84)
BUN, Bld: 15 mg/dL (ref 7–22)
CO2: 28 mEq/L (ref 18–33)
Glucose, Bld: 156 mg/dL — ABNORMAL HIGH (ref 73–118)
Potassium: 4.2 mEq/L (ref 3.3–4.7)

## 2009-12-09 LAB — TECHNOLOGIST REVIEW CHCC SATELLITE

## 2009-12-12 LAB — CBC WITH DIFFERENTIAL (CANCER CENTER ONLY)
MCV: 95 fL (ref 82–98)
Platelets: 12 10*3/uL — ABNORMAL LOW (ref 145–400)
RBC: 3.33 10*6/uL — ABNORMAL LOW (ref 4.20–5.70)
WBC: 2.2 10*3/uL — ABNORMAL LOW (ref 4.0–10.0)

## 2009-12-12 LAB — CMP (CANCER CENTER ONLY)
ALT(SGPT): 20 U/L (ref 10–47)
AST: 18 U/L (ref 11–38)
Albumin: 3.8 g/dL (ref 3.3–5.5)
CO2: 28 mEq/L (ref 18–33)
Calcium: 9.7 mg/dL (ref 8.0–10.3)
Chloride: 100 mEq/L (ref 98–108)
Creat: 0.9 mg/dl (ref 0.6–1.2)
Potassium: 4.3 mEq/L (ref 3.3–4.7)
Sodium: 141 mEq/L (ref 128–145)
Total Protein: 7 g/dL (ref 6.4–8.1)

## 2009-12-12 LAB — MANUAL DIFFERENTIAL (CHCC SATELLITE)
ANC (CHCC HP manual diff): 1.5 10*3/uL (ref 1.5–6.5)
Eos: 1 % (ref 0–7)
Metamyelocytes: 1 % — ABNORMAL HIGH (ref 0–0)
Myelocytes: 2 % — ABNORMAL HIGH (ref 0–0)
PLT EST ~~LOC~~: DECREASED

## 2009-12-19 ENCOUNTER — Encounter: Payer: Self-pay | Admitting: Internal Medicine

## 2010-01-11 ENCOUNTER — Ambulatory Visit: Payer: Self-pay | Admitting: Hematology & Oncology

## 2010-01-12 ENCOUNTER — Telehealth: Payer: Self-pay | Admitting: Internal Medicine

## 2010-01-12 ENCOUNTER — Telehealth (INDEPENDENT_AMBULATORY_CARE_PROVIDER_SITE_OTHER): Payer: Self-pay | Admitting: *Deleted

## 2010-01-13 LAB — CBC WITH DIFFERENTIAL (CANCER CENTER ONLY)
BASO%: 1.1 % (ref 0.0–2.0)
EOS%: 0.9 % (ref 0.0–7.0)
HCT: 27.2 % — ABNORMAL LOW (ref 38.7–49.9)
LYMPH%: 22.3 % (ref 14.0–48.0)
MCH: 31.9 pg (ref 28.0–33.4)
MCHC: 34.6 g/dL (ref 32.0–35.9)
MCV: 92 fL (ref 82–98)
MONO#: 0.9 10*3/uL (ref 0.1–0.9)
MONO%: 30.3 % — ABNORMAL HIGH (ref 0.0–13.0)
NEUT%: 45.4 % (ref 40.0–80.0)
Platelets: 108 10*3/uL — ABNORMAL LOW (ref 145–400)
RDW: 12.3 % (ref 10.5–14.6)

## 2010-01-13 LAB — CMP (CANCER CENTER ONLY)
ALT(SGPT): 23 U/L (ref 10–47)
Alkaline Phosphatase: 56 U/L (ref 26–84)
CO2: 30 mEq/L (ref 18–33)
Creat: 0.8 mg/dl (ref 0.6–1.2)
Sodium: 143 mEq/L (ref 128–145)
Total Bilirubin: 0.7 mg/dl (ref 0.20–1.60)

## 2010-01-13 LAB — LACTATE DEHYDROGENASE: LDH: 193 U/L (ref 94–250)

## 2010-01-19 LAB — CBC WITH DIFFERENTIAL (CANCER CENTER ONLY)
BASO#: 0 10e3/uL (ref 0.0–0.2)
BASO%: 0.7 % (ref 0.0–2.0)
EOS%: 1.1 % (ref 0.0–7.0)
Eosinophils Absolute: 0 10e3/uL (ref 0.0–0.5)
HCT: 29.1 % — ABNORMAL LOW (ref 38.7–49.9)
HGB: 9.9 g/dL — ABNORMAL LOW (ref 13.0–17.1)
LYMPH#: 1.2 10e3/uL (ref 0.9–3.3)
LYMPH%: 31.1 % (ref 14.0–48.0)
MCH: 32.2 pg (ref 28.0–33.4)
MCHC: 34.2 g/dL (ref 32.0–35.9)
MCV: 94 fL (ref 82–98)
MONO#: 0.7 10e3/uL (ref 0.1–0.9)
MONO%: 19.1 % — ABNORMAL HIGH (ref 0.0–13.0)
NEUT#: 1.9 10e3/uL (ref 1.5–6.5)
NEUT%: 48 % (ref 40.0–80.0)
Platelets: 228 10e3/uL (ref 145–400)
RBC: 3.09 10e6/uL — ABNORMAL LOW (ref 4.20–5.70)
RDW: 13.2 % (ref 10.5–14.6)
WBC: 3.9 10e3/uL — ABNORMAL LOW (ref 4.0–10.0)

## 2010-01-19 LAB — CMP (CANCER CENTER ONLY)
ALT(SGPT): 19 U/L (ref 10–47)
AST: 22 U/L (ref 11–38)
Albumin: 3.2 g/dL — ABNORMAL LOW (ref 3.3–5.5)
Alkaline Phosphatase: 49 U/L (ref 26–84)
BUN, Bld: 11 mg/dL (ref 7–22)
CO2: 27 meq/L (ref 18–33)
Calcium: 9.2 mg/dL (ref 8.0–10.3)
Chloride: 104 meq/L (ref 98–108)
Creat: 0.6 mg/dL (ref 0.6–1.2)
Glucose, Bld: 151 mg/dL — ABNORMAL HIGH (ref 73–118)
Potassium: 3.7 meq/L (ref 3.3–4.7)
Sodium: 142 meq/L (ref 128–145)
Total Bilirubin: 0.6 mg/dL (ref 0.20–1.60)
Total Protein: 6.7 g/dL (ref 6.4–8.1)

## 2010-01-26 ENCOUNTER — Ambulatory Visit (HOSPITAL_BASED_OUTPATIENT_CLINIC_OR_DEPARTMENT_OTHER)
Admission: RE | Admit: 2010-01-26 | Discharge: 2010-01-26 | Payer: Self-pay | Source: Home / Self Care | Attending: Hematology & Oncology | Admitting: Hematology & Oncology

## 2010-01-26 LAB — MAGNESIUM: Magnesium: 1.8 mg/dL (ref 1.5–2.5)

## 2010-01-26 LAB — CMP (CANCER CENTER ONLY)
ALT(SGPT): 17 U/L (ref 10–47)
AST: 19 U/L (ref 11–38)
Albumin: 3.3 g/dL (ref 3.3–5.5)
Alkaline Phosphatase: 58 U/L (ref 26–84)
BUN, Bld: 10 mg/dL (ref 7–22)
CO2: 26 mEq/L (ref 18–33)
Calcium: 8.8 mg/dL (ref 8.0–10.3)
Chloride: 104 mEq/L (ref 98–108)
Creat: 0.9 mg/dl (ref 0.6–1.2)
Glucose, Bld: 182 mg/dL — ABNORMAL HIGH (ref 73–118)
Potassium: 4.2 mEq/L (ref 3.3–4.7)
Sodium: 138 mEq/L (ref 128–145)
Total Bilirubin: 0.6 mg/dl (ref 0.20–1.60)
Total Protein: 6.9 g/dL (ref 6.4–8.1)

## 2010-01-26 LAB — CBC WITH DIFFERENTIAL (CANCER CENTER ONLY)
BASO#: 0 10*3/uL (ref 0.0–0.2)
BASO%: 0.5 % (ref 0.0–2.0)
EOS%: 1.9 % (ref 0.0–7.0)
Eosinophils Absolute: 0.1 10*3/uL (ref 0.0–0.5)
HCT: 32.3 % — ABNORMAL LOW (ref 38.7–49.9)
HGB: 10.9 g/dL — ABNORMAL LOW (ref 13.0–17.1)
LYMPH#: 1.3 10*3/uL (ref 0.9–3.3)
LYMPH%: 24.5 % (ref 14.0–48.0)
MCH: 32.3 pg (ref 28.0–33.4)
MCHC: 33.6 g/dL (ref 32.0–35.9)
MCV: 96 fL (ref 82–98)
MONO#: 0.8 10*3/uL (ref 0.1–0.9)
MONO%: 15 % — ABNORMAL HIGH (ref 0.0–13.0)
NEUT#: 3.2 10*3/uL (ref 1.5–6.5)
NEUT%: 58.1 % (ref 40.0–80.0)
Platelets: 221 10*3/uL (ref 145–400)
RBC: 3.36 10*6/uL — ABNORMAL LOW (ref 4.20–5.70)
RDW: 15 % — ABNORMAL HIGH (ref 10.5–14.6)
WBC: 5.4 10*3/uL (ref 4.0–10.0)

## 2010-02-02 ENCOUNTER — Ambulatory Visit (HOSPITAL_BASED_OUTPATIENT_CLINIC_OR_DEPARTMENT_OTHER): Payer: PRIVATE HEALTH INSURANCE | Admitting: Hematology & Oncology

## 2010-02-02 LAB — CMP (CANCER CENTER ONLY)
ALT(SGPT): 26 U/L (ref 10–47)
AST: 30 U/L (ref 11–38)
Albumin: 3.3 g/dL (ref 3.3–5.5)
Alkaline Phosphatase: 45 U/L (ref 26–84)
BUN, Bld: 8 mg/dL (ref 7–22)
CO2: 26 mEq/L (ref 18–33)
Calcium: 9 mg/dL (ref 8.0–10.3)
Chloride: 110 mEq/L — ABNORMAL HIGH (ref 98–108)
Creat: 0.9 mg/dl (ref 0.6–1.2)
Glucose, Bld: 158 mg/dL — ABNORMAL HIGH (ref 73–118)
Potassium: 4 mEq/L (ref 3.3–4.7)
Sodium: 143 mEq/L (ref 128–145)
Total Bilirubin: 0.6 mg/dl (ref 0.20–1.60)
Total Protein: 7 g/dL (ref 6.4–8.1)

## 2010-02-02 LAB — CBC WITH DIFFERENTIAL (CANCER CENTER ONLY)
BASO#: 0 10*3/uL (ref 0.0–0.2)
BASO%: 0.6 % (ref 0.0–2.0)
EOS%: 2.7 % (ref 0.0–7.0)
Eosinophils Absolute: 0.1 10*3/uL (ref 0.0–0.5)
HCT: 32.5 % — ABNORMAL LOW (ref 38.7–49.9)
HGB: 11.1 g/dL — ABNORMAL LOW (ref 13.0–17.1)
LYMPH#: 1.4 10*3/uL (ref 0.9–3.3)
LYMPH%: 38.7 % (ref 14.0–48.0)
MCH: 32.5 pg (ref 28.0–33.4)
MCHC: 34 g/dL (ref 32.0–35.9)
MCV: 96 fL (ref 82–98)
MONO#: 0.3 10*3/uL (ref 0.1–0.9)
MONO%: 8.6 % (ref 0.0–13.0)
NEUT#: 1.7 10*3/uL (ref 1.5–6.5)
NEUT%: 49.4 % (ref 40.0–80.0)
Platelets: 226 10*3/uL (ref 145–400)
RBC: 3.4 10*6/uL — ABNORMAL LOW (ref 4.20–5.70)
RDW: 14 % (ref 10.5–14.6)
WBC: 3.5 10*3/uL — ABNORMAL LOW (ref 4.0–10.0)

## 2010-02-02 LAB — MAGNESIUM: Magnesium: 1.9 mg/dL (ref 1.5–2.5)

## 2010-02-16 ENCOUNTER — Encounter: Payer: Self-pay | Admitting: Internal Medicine

## 2010-02-16 LAB — CBC WITH DIFFERENTIAL (CANCER CENTER ONLY)
BASO#: 0 10*3/uL (ref 0.0–0.2)
BASO%: 0.3 % (ref 0.0–2.0)
HCT: 36.1 % — ABNORMAL LOW (ref 38.7–49.9)
HGB: 12.1 g/dL — ABNORMAL LOW (ref 13.0–17.1)
LYMPH#: 1.5 10*3/uL (ref 0.9–3.3)
MCH: 32.3 pg (ref 28.0–33.4)
MCHC: 33.6 g/dL (ref 32.0–35.9)
MCV: 96 fL (ref 82–98)
MONO%: 10.6 % (ref 0.0–13.0)

## 2010-02-16 LAB — CMP (CANCER CENTER ONLY)
ALT(SGPT): 21 U/L (ref 10–47)
Albumin: 3.2 g/dL — ABNORMAL LOW (ref 3.3–5.5)
Alkaline Phosphatase: 41 U/L (ref 26–84)
CO2: 27 mEq/L (ref 18–33)
Glucose, Bld: 148 mg/dL — ABNORMAL HIGH (ref 73–118)
Potassium: 3.8 mEq/L (ref 3.3–4.7)
Sodium: 138 mEq/L (ref 128–145)
Total Protein: 6.9 g/dL (ref 6.4–8.1)

## 2010-02-19 LAB — CONVERTED CEMR LAB
BUN: 21 mg/dL (ref 6–23)
Basophils Relative: 0.4 % (ref 0.0–1.0)
Chloride: 103 meq/L (ref 96–112)
Creatinine, Ser: 1 mg/dL (ref 0.4–1.5)
Eosinophils Relative: 0.7 % (ref 0.0–5.0)
Glucose, Bld: 101 mg/dL — ABNORMAL HIGH (ref 70–99)
Glucose, Urine, Semiquant: NEGATIVE
HCT: 41.6 % (ref 39.0–52.0)
Ketones, urine, test strip: NEGATIVE
Monocytes Relative: 11.2 % (ref 3.0–12.0)
Neutrophils Relative %: 65.6 % (ref 43.0–77.0)
Nitrite: NEGATIVE
PSA: 0.94 ng/mL (ref 0.10–4.00)
Platelets: 216 10*3/uL (ref 150–400)
Potassium: 4.7 meq/L (ref 3.5–5.1)
RBC: 4.25 M/uL (ref 4.22–5.81)
WBC Urine, dipstick: NEGATIVE
WBC: 5.2 10*3/uL (ref 4.5–10.5)
pH: 5

## 2010-02-21 LAB — SPEP & IFE WITH QIG
Albumin ELP: 60.1 % (ref 55.8–66.1)
Alpha-2-Globulin: 11.6 % (ref 7.1–11.8)
Beta Globulin: 5.9 % (ref 4.7–7.2)
Total Protein, Serum Electrophoresis: 6.9 g/dL (ref 6.0–8.3)

## 2010-02-21 LAB — KAPPA/LAMBDA LIGHT CHAINS
Kappa free light chain: 0.94 mg/dL (ref 0.33–1.94)
Lambda Free Lght Chn: <0.47 mg/dL — ABNORMAL LOW (ref 0.57–2.63)

## 2010-02-21 LAB — FERRITIN: Ferritin: 409 ng/mL — ABNORMAL HIGH (ref 22–322)

## 2010-02-21 NOTE — Assessment & Plan Note (Signed)
Summary: 6 month ov-fasting//ph   Vital Signs:  Patient profile:   70 year old male Height:      68 inches Weight:      225.2 pounds BMI:     34.37 Pulse rate:   88 / minute BP sitting:   160 / 82  Vitals Entered By: Shary Decamp (April 06, 2009 8:55 AM) CC: rov - fasting, needs refill on meds 90 day supply Comments  - c/o of fatigue  - cramp in left thigh x yesterday BP readings @ home 149/72, 113/75, 142/76, 118/65, 128/74, 136/76, 134/77, 129/66, 160/79 Shary Decamp  April 06, 2009 8:58 AM    History of Present Illness: rov - fasting, needs refill on meds 90 day supply  c/o of fatigue-- no "get up and go" but once he starts he is ok; does a lot of yard work  cramp in left thigh x yesterday; has this problem on-off   HTN---BP readings @ home 149/72, 113/75, 142/76, 118/65, 128/74, 136/76, 134/77, 129/66, 160/79   Hyperlipidemia-- good medication compliance   GI-- notes from Dr Arlyce Dice reviewed, not taking a PPI at present  just Rolaids sometimes  "PPIs are expensive" patient is taking over-the-counter iron every other day because he knows his hemoglobin has been low      Current Medications (verified): 1)  Lisinopril 10 Mg Tabs (Lisinopril) .Marland Kitchen.. 1 By Mouth Once Daily 2)  Metoprolol Tartrate 100 Mg Tabs (Metoprolol Tartrate) .... Take 1 Tablet By Mouth Twice A Day 3)  Simvastatin 80 Mg Tabs (Simvastatin) .... Take 1  By Mouth At Bedtime 4)  Aspirin 81 Mg Tabs (Aspirin) .Marland Kitchen.. 1 A Day 5)  Fish Oil .... Qd 6)  B Complex .Marland Kitchen.. 1 Once Daily 7)  Cinnamon 750mg  .... 1 By Mouth Once Daily 8)  Niacin 250mg  .... Every Other Day (Otc) 9)  B12 .Marland Kitchen.. 1 By Mouth Once Daily 10)  Magnesium 300 Mg Caps (Magnesium) .Marland Kitchen.. 1 By Mouth Once Daily 11)  Iron 65mg  .... Every Other Day  Allergies (verified): No Known Drug Allergies  Past History:  Past Medical History: Coronary artery disease - cath 1998 - V dz - for medical management Hyperlipidemia Hypertension Carotid u/s 0-49% B  01/2004 arterial doppler <50% R vessel dz 03/2005 cardiolite neg 03/2005 EGD 06-2008: Barrett's, Ulcer, duodenitis  Barretts Esophagus Arthritis GERD  Past Surgical History: Rotator cuff repair  knee surgery (correction ---> R) 1990s Spinal fusion (Neck) Lumbar fusion  Social History: Reviewed history from 08/18/2008 and no changes required. Married 4 children Patient has never smoked.  Alcohol Use - yes  less than 1 per day Illicit Drug Use - no Occupation: Retired  Review of Systems       denies chest pain, shortness or breath, palpitations. No lower extremity edema  he did have cough in December, that is resolved  he occasionally has epigastric discomfort , symptoms resolve after her over-the-counter Rolaids. Denies specifically heartburn, dysphagia or odynophagia.  Denies any difficulty urinating, or hematuria. Occasionally feels some urinary frequency  Physical Exam  General:  alert and well-developed.   Neck:  left carotid artery with a good pulse, no bruit right carotid artery, diminished pulse, no bruit Lungs:  normal respiratory effort, no intercostal retractions, no accessory muscle use, and normal breath sounds.   Heart:  normal rate, regular rhythm, no murmur, and no gallop.   Abdomen:  soft and non-tender.  good bowel sounds Rectal:  No external abnormalities noted. Normal sphincter tone. No rectal masses  or tenderness. brown stools, Hemoccult negative Prostate:  Prostate gland firm and smooth, no enlargement, nodularity, tenderness, mass, asymmetry or induration. Extremities:  no edema Neurologic:  speech, gait, motor: WNL Psych:  Oriented X3, memory intact for recent and remote, normally interactive, good eye contact, not anxious appearing, and not depressed appearing.     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) history of Barrett's esophagus next EGD  6/11 currently not taking any PPIs due to cost, nearly  asymptomatic, see review of systems discussed with patient what Barrett is, I recommend to take Prilosec OTC or Zantac daily even if asymptomatic he likes his iron check, will do  Orders: TLB-IBC Pnl (Iron/FE;Transferrin) (83550-IBC)  Problem # 2:  HEALTH SCREENING (ICD-V70.0)  due for a yearly checkup DRE  normal today, checking a PSA  Problem # 3:  FATIGUE (ICD-780.79) despite feeling fatigued, he is very active in the yard, CV  review of systems negative  labs  Orders: TLB-CBC Platelet - w/Differential (85025-CBCD) TLB-TSH (Thyroid Stimulating Hormone) (84443-TSH)  Problem # 4:  HYPERTENSION (ICD-401.9) ambulatory BP sometimes elevated, increased lisinopril  to 20 mg His updated medication list for this problem includes:    Lisinopril 20 Mg Tabs (Lisinopril) .Marland Kitchen... 1 a day    Metoprolol Tartrate 100 Mg Tabs (Metoprolol tartrate) .Marland Kitchen... Take 1 tablet by mouth twice a day    BP today: 160/82 Prior BP: 122/60 (10/07/2008)  Labs Reviewed: K+: 3.9 (08/26/2008) Creat: : 1.0 (08/26/2008)   Chol: 129 (01/06/2008)   HDL: 27.3 (01/06/2008)   LDL: DEL (01/06/2008)   TG: 219 (01/06/2008)  Orders: Venipuncture (16109) TLB-BMP (Basic Metabolic Panel-BMET) (80048-METABOL)  Problem # 5:  HYPERLIPIDEMIA (ICD-272.4) due for labs His updated medication list for this problem includes:    Simvastatin 80 Mg Tabs (Simvastatin) .Marland Kitchen... Take 1  by mouth at bedtime    Labs Reviewed: SGOT: 29 (01/06/2008)   SGPT: 29 (01/06/2008)   HDL:27.3 (01/06/2008), 72.3 (01/20/2007)  LDL:DEL (01/06/2008), 90 (60/45/4098)  Chol:129 (01/06/2008), 179 (01/20/2007)  Trig:219 (01/06/2008), 83 (01/20/2007)  Orders: TLB-Lipid Panel (80061-LIPID) TLB-Hepatic/Liver Function Pnl (80076-HEPATIC)  Problem # 6:  CORONARY ARTERY DISEASE (ICD-414.00) asymptomatic on examination, his right carotid pulse is decreased, check ultrasound   His updated medication list for this problem includes:    Lisinopril 20 Mg Tabs  (Lisinopril) .Marland Kitchen... 1 a day    Metoprolol Tartrate 100 Mg Tabs (Metoprolol tartrate) .Marland Kitchen... Take 1 tablet by mouth twice a day    Aspirin 81 Mg Tabs (Aspirin) .Marland Kitchen... 1 a day  Orders: Cardiology Referral (Cardiology)  Complete Medication List: 1)  Lisinopril 20 Mg Tabs (Lisinopril) .Marland Kitchen.. 1 a day 2)  Metoprolol Tartrate 100 Mg Tabs (Metoprolol tartrate) .... Take 1 tablet by mouth twice a day 3)  Simvastatin 80 Mg Tabs (Simvastatin) .... Take 1  by mouth at bedtime 4)  Aspirin 81 Mg Tabs (Aspirin) .Marland Kitchen.. 1 a day 5)  Fish Oil  .... Qd 6)  B Complex  .Marland Kitchen.. 1 once daily 7)  Cinnamon 750mg   .... 1 by mouth once daily 8)  Niacin 250mg   .... Every other day (otc) 9)  B12  .Marland KitchenMarland Kitchen. 1 by mouth once daily 10)  Magnesium 300 Mg Caps (Magnesium) .Marland Kitchen.. 1 by mouth once daily 11)  Iron 65mg   .... Every other day 12)  Prilosec Otc 20 Mg Tbec (Omeprazole magnesium) .... One every morning before breakfast 13)  Ranitidine Hcl 300 Mg Tabs (Ranitidine hcl) .... One at bedtime  Other Orders:  TLB-PSA (Prostate Specific Antigen) (84153-PSA)  Patient Instructions: 1)   increased lisinopril  to 20 mg 2)  you need a daily medication for Barrett's: Try either Prilosec OTC or Zantac, whatever is less expensive 3)  Please schedule a follow-up appointment in 4 months .  Prescriptions: SIMVASTATIN 80 MG TABS (SIMVASTATIN) Take 1  by mouth at bedtime  #90 x 3   Entered by:   Shary Decamp   Authorized by:   Nolon Rod. Paz MD   Signed by:   Shary Decamp on 04/06/2009   Method used:   Print then Give to Patient   RxID:   [id number] METOPROLOL TARTRATE 100 MG TABS (METOPROLOL TARTRATE) Take 1 tablet by mouth twice a day  #180 x 3   Entered by:   Shary Decamp   Authorized by:   Nolon Rod. Paz MD   Signed by:   Shary Decamp on 04/06/2009   Method used:   Print then Give to Patient   RxID:   [id number] RANITIDINE HCL 300 MG TABS (RANITIDINE HCL) one at bedtime  #90 x 3   Entered and Authorized by:   Nolon Rod. Paz MD    Signed by:   Nolon Rod. Paz MD on 04/06/2009   Method used:   Print then Give to Patient   RxID:   [id number] PRILOSEC OTC 20 MG TBEC (OMEPRAZOLE MAGNESIUM) one every morning before breakfast  #90 x 3   Entered and Authorized by:   Nolon Rod. Paz MD   Signed by:   Nolon Rod. Paz MD on 04/06/2009   Method used:   Print then Give to Patient   RxID:   [id number] LISINOPRIL 20 MG TABS (LISINOPRIL) 1 a day  #90 x 9   Entered and Authorized by:   Elita Quick E. Paz MD   Signed by:   Nolon Rod. Paz MD on 04/06/2009   Method used:   Print then Give to Patient   RxID:   [id number]

## 2010-02-21 NOTE — Progress Notes (Signed)
Summary: Flu Shot??  hold per oncology  Phone Note Call from Patient Call back at Home Phone [phone]   Caller: Spouse Summary of Call: Patient's wife wanted to know if Hridaan should get a flu shot, since he is still being activly treated for his cancer. Please advise.  Initial call taken by: Harold Barban,  September 16, 2009 9:31 AM  Follow-up for Phone Call        I believe is fine as long as is the shot and NOT the intranasal preparation. Will double check with oncology. please call the   nurse for Dr. Myna Hidalgo @ oncology: okay for flu vaccine?  Jose E. Paz MD  September 16, 2009 3:49 PM   Additional Follow-up for Phone Call Additional follow up Details #1::        I spoke with Dr.Ennevers nurse and Dr.Ennever states that since he is in the middle of his treatment he should not get the flu shot at this time. Pt is aware. Army Fossa CMA  September 19, 2009 9:28 AM

## 2010-02-21 NOTE — Miscellaneous (Signed)
Summary: Orders Update  Clinical Lists Changes  Problems: Added new problem of CAROTID BRUIT (ICD-785.9) Orders: Added new Test order of Carotid Duplex (Carotid Duplex) - Signed 

## 2010-02-21 NOTE — Letter (Signed)
Summary: f/u myeloma---- Cancer Center  [facility]   Imported By: Lanelle Bal 12/16/2009 11:18:35  _____________________________________________________________________  External Attachment:    Type:   Image     Comment:   External Document

## 2010-02-21 NOTE — Letter (Signed)
Summary: myeloma followup-- Cancer Center  [facility]   Imported By: Lanelle Bal 08/24/2009 12:29:10  _____________________________________________________________________  External Attachment:    Type:   Image     Comment:   External Document

## 2010-02-21 NOTE — Progress Notes (Signed)
Summary: Colon recall rescheduled  Phone Note Outgoing Call   Call placed by: Laureen Ochs LPN,  August 29, 2007 10:36 AM Call placed to: Patient Summary of Call: Pt. was due for recall colon in 08/2007, but his insurance won't pay for it until 08/2012. Dr.Gavyn Ybarra reviewed pt. chart and agrees he can wait. I spoke with Mrs.Mccarron and she is aware the new recall date is 08/2012 and we have it noted in IDX. Advised her to call for any GI problems,questions, etc. for Mr.Glanz. Initial call taken by: Laureen Ochs LPN,  August 29, 2007 10:40 AM

## 2010-02-21 NOTE — Letter (Signed)
Summary: myeloma followup, very good response--oncology  [facility]   Imported By: Lanelle Bal 11/15/2009 14:09:59  _____________________________________________________________________  External Attachment:    Type:   Image     Comment:   External Document

## 2010-02-21 NOTE — Assessment & Plan Note (Signed)
Summary: CPX---/kdc   Vital Signs:  Patient profile:   70 year old male Weight:      219.38 pounds Pulse rate:   76 / minute Pulse rhythm:   regular BP sitting:   136 / 84  (left arm) Cuff size:   large  Vitals Entered By: Army Fossa CMA (August 08, 2009 8:09 AM) CC: Pt here for 4 month f/u on BP.   History of Present Illness: Here for Medicare AWV:  1.   Risk factors based on Past M, S, F history: yes  2.   Physical Activities: very active , yard work 3.   Depression/mood: mood ok despite recent dx of MM 4.   Hearing: slightly  decreased ,able to carry out a conversation 5.   ADL's:  totally indeopendent  6.   Fall Risk: low  7.   Home Safety: has Conservation officer, nature at home  8.   Height, weight, &visual acuity: see VS,  vision fully corrected  9.   Counseling: diet and exercise discussed 10.   Labs ordered based on risk factors: yes  11.           Referral Coordination: yes  12.           Care Plan-- see a/p 13.            Cognitive Assessment: alertness and motor skills seems to be normal for age  ====================================== in addition to his physical exam, we addressed the following issues:  MM new dx, s/p treatment chemo x 4, Dr Myna Hidalgo, anemia better, responding well sofar goal to get to a stem cell transplant    Hyperlipidemia-- good medication compliance w/ simvastatin; denies myalgias or arthralgias   Hypertension--the patient will follow off his BPs, they are around 120-130/70 and 80; pulse in the 60s  Carotid dz? 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 03-2009 arterial doppler <50% R vessel dz 03/2005 cardiolite neg 03/2005 EGD 06-2008: Barrett's, Ulcer,  duodenitis  Barretts Esophagus Arthritis GERD  Past Surgical History: Rotator cuff repair  knee surgery  *** R*** 1990s Spinal fusion (Neck) Lumbar fusion BM Bx 3-11  Family History: Father: prostate ca? Vkidney ca? colon ca-- no Mother:leukemia Siblings: bro - PE GF - stroke    Social History: Reviewed history from 08/18/2008 and no changes required. Married 4 children Patient has never smoked.  Alcohol Use - yes  less than 1 per day Illicit Drug Use - no Occupation: Retired  Review of Systems CV:  Denies chest pain or discomfort, palpitations, and swelling of feet. Resp:  Denies shortness of breath, sputum productive, and wheezing; occasionally cough and hoarsenes, symptoms decreased w/ nasal steroids . GI:  Denies bloody stools, nausea, and vomiting. GU:  Denies dysuria, hematuria, urinary frequency, and urinary hesitancy.  Physical Exam  General:  alert and well-developed.   Neck:  no masses and no thyromegaly.   Lungs:  normal respiratory effort, no intercostal retractions, no accessory muscle use, and normal breath sounds.   Heart:  normal rate, regular rhythm, no murmur, and no gallop.   Abdomen:  soft, non-tender, no distention, no masses, no guarding, and no rigidity.  no bruit  Prostate:  done 03/2009 Extremities:  no lower extremity edema  Psych:  Cognition and judgment appear intact. Alert and cooperative with normal attention span and concentration.  not anxious appearing and not depressed appearing.     Impression & Recommendations:  Problem # 1:  HEALTH SCREENING (ICD-V70.0)  Td 2002? pneumonia shot 2008 never had a shingles shot, currently on chemo for MM DRE and PSA normal 03/2009 had a colonoscopy in 2004.  The found diverticuli.  No polyps.  NEXT Cscope 2014  diet  and exercise discussed  Orders: First annual wellness visit with prevention plan  (V7846)  Problem # 2:  HYPERTENSION (ICD-401.9) at goal  His updated medication list for this  problem includes:    Lisinopril 20 Mg Tabs (Lisinopril) .Marland Kitchen... 1 a day    Metoprolol Tartrate 100 Mg Tabs (Metoprolol tartrate) .Marland Kitchen... Take 1 tablet by mouth twice a day  BP today: 136/84 Prior BP: 160/82 (04/06/2009)  Labs Reviewed: K+: 4.7 (04/06/2009) Creat: : 0.9 (04/06/2009)   Chol: 116 (04/06/2009)   HDL: 29.20 (04/06/2009)   LDL: DEL (01/06/2008)   TG: 219.0 (04/06/2009)  Problem # 3:  HYPERLIPIDEMIA (ICD-272.4) no change His updated medication list for this problem includes:    Simvastatin 80 Mg Tabs (Simvastatin) .Marland Kitchen... Take 1  by mouth at bedtime  Labs Reviewed: SGOT: 34 (04/06/2009)   SGPT: 33 (04/06/2009)   HDL:29.20 (04/06/2009), 27.3 (01/06/2008)  LDL:DEL (01/06/2008), 90 (96/29/5284)  Chol:116 (04/06/2009), 129 (01/06/2008)  Trig:219.0 (04/06/2009), 219 (01/06/2008)  Problem # 4:  CORONARY ARTERY DISEASE (ICD-414.00) controlling risk factors, asymptomatic His updated medication list for this problem includes:    Lisinopril 20 Mg Tabs (Lisinopril) .Marland Kitchen... 1 a day    Metoprolol Tartrate 100 Mg Tabs (Metoprolol tartrate) .Marland Kitchen... Take 1 tablet by mouth twice a day    Aspirin 81 Mg Tabs (Aspirin) .Marland Kitchen... 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 1 a day 2)  Metoprolol Tartrate 100 Mg Tabs (Metoprolol tartrate) .... Take 1 tablet by mouth twice a day 3)  Simvastatin 80 Mg Tabs (Simvastatin) .... Take 1  by mouth at bedtime 4)  Aspirin 81 Mg Tabs (Aspirin) .... 2 a day 5)  Fish Oil  .... Qd 6)  B Complex  .Marland Kitchen.. 1 once daily 7)  Cinnamon 1000 Mg  .Marland KitchenMarland Kitchen. 1 by mouth once daily 8)  Niacin 250mg   .... Every other day (otc) 9)  B12  .Marland KitchenMarland Kitchen. 1 by mouth once daily 10)  Magnesium 300 Mg Caps (Magnesium) .Marland Kitchen.. 1 by mouth once daily 11)  Ranitidine Hcl 300 Mg Tabs (Ranitidine hcl) .... One at bedtime 12)  Famvir 250 Mg Tabs (Famciclovir) 13)  Zometa 4 Mg/2ml Conc (Zoledronic acid) .... Once monthly 14)  Fluticasone Propionate 0.05 % Crea (Fluticasone  propionate) .... At night. 15)  Revlimid 25 Mg Caps (Lenalidomide) .... Daily 16)  Valdene  .Marland Kitchen.. 1x day for 21 days off 7 days  Patient Instructions: 1)  Please schedule a follow-up appointment in 6 months .  Prescriptions: FLUTICASONE PROPIONATE 0.05 % CREA (FLUTICASONE PROPIONATE) at night.  #30 x 12   Entered and Authorized by:   Nolon Rod. Berry Godsey MD   Signed by:   Nolon Rod. Sten Dematteo MD on 08/08/2009   Method used:   Print then Give to Patient   RxID:   [id number] FLUTICASONE PROPIONATE 0.05 % CREA (FLUTICASONE PROPIONATE) at night.  #90 day x 3   Entered and Authorized by:   Nolon Rod. Jacques Fife MD   Signed by:   Nolon Rod. Ramyah Pankowski MD on 08/08/2009   Method used:   Print then Give to Patient   RxID:   [id number] RANITIDINE HCL 300 MG TABS (RANITIDINE HCL) one at bedtime  #90 x 3   Entered and Authorized by:   Nolon Rod. Hydee Fleece MD   Signed by:   Nolon Rod. Al Gagen MD on 08/08/2009   Method used:   Print then Give to Patient   RxID:   [id number] SIMVASTATIN 80 MG TABS (SIMVASTATIN) Take 1  by mouth at bedtime  #90 x 3   Entered and Authorized by:   Nolon Rod. Tsion Inghram MD   Signed by:   Nolon Rod. Alinah Sheard MD on 08/08/2009   Method used:   Print then Give to Patient   RxID:    [id number] METOPROLOL TARTRATE 100 MG TABS (METOPROLOL TARTRATE) Take 1 tablet by mouth twice a day  #180 x 3   Entered and Authorized by:   Nolon Rod. Zakyah Yanes MD   Signed by:   Nolon Rod. Dola Lunsford MD on 08/08/2009   Method used:   Print then Give to Patient   RxID:   [id number] LISINOPRIL 20 MG TABS (LISINOPRIL) 1 a day  #90 x 3   Entered and Authorized by:   Nolon Rod. Shawnmichael Parenteau MD   Signed by:   Nolon Rod. Frayda Egley MD on 08/08/2009   Method used:   Print then Give to Patient   RxID:   [id number]

## 2010-02-21 NOTE — Letter (Signed)
Summary: myeloma, one chemotherapy left  [facility]   Imported By: Lanelle Bal 10/06/2009 09:07:47  _____________________________________________________________________  External Attachment:    Type:   Image     Comment:   External Document

## 2010-02-21 NOTE — Consult Note (Signed)
Summary: myeloma f/u, Cancer Center  [facility]   Imported By: Lanelle Bal 09/06/2009 10:35:14  _____________________________________________________________________  External Attachment:    Type:   Image     Comment:   External Document

## 2010-02-21 NOTE — Progress Notes (Signed)
Summary: RX  Phone Note Refill Request   Refills Requested: Medication #1:  METOPROLOL TARTRATE 100 MG TABS Take 1 tablet by mouth twice a day  Medication #2:  SIMVASTATIN 80 MG TABS Take 1  by mouth at bedtime ASSURED [phone] OR [phone]  Initial call taken by: Freddy Jaksch,  April 04, 2009 2:55 PM  Follow-up for Phone Call        pt has ov with Dr. Drue Novel on 04/06/09 Follow-up by: Shary Decamp,  April 04, 2009 5:18 PM    Prescriptions: SIMVASTATIN 80 MG TABS (SIMVASTATIN) Take 1  by mouth at bedtime  #90 x 0   Entered by:   Shary Decamp   Authorized by:   Nolon Rod. Paz MD   Signed by:   Shary Decamp on 04/04/2009   Method used:   Printed then faxed to ...       [facility] [address] #[id number]* (retail)       [address] [place], [place]  [address]       Ph: [phone]       Fax: [phone]   RxID:   [id number] METOPROLOL TARTRATE 100 MG TABS (METOPROLOL TARTRATE) Take 1 tablet by mouth twice a day  #180 x 0   Entered by:   Shary Decamp   Authorized by:   Nolon Rod. Paz MD   Signed by:   Shary Decamp on 04/04/2009   Method used:   Printed then faxed to ...       [facility] [address] #[id number]* (retail)       [address] [place], [place]  [address]       Ph: [phone]       Fax: [phone]   RxID:   [id number]

## 2010-02-21 NOTE — Progress Notes (Signed)
Summary: new dx of multple myeloma  Phone Note Call from Patient   Summary of Call: FYI -- pt was just dx with multiple myeloma.  Advised pt to call if he needs anything dx added to problem list [place]  April 15, 2009 11:48 AM   New Problems: MULTIPLE  MYELOMA (ICD-203.00)   New Problems: MULTIPLE  MYELOMA (ICD-203.00)

## 2010-02-23 ENCOUNTER — Encounter (HOSPITAL_BASED_OUTPATIENT_CLINIC_OR_DEPARTMENT_OTHER): Payer: PRIVATE HEALTH INSURANCE | Admitting: Hematology & Oncology

## 2010-02-23 DIAGNOSIS — Z452 Encounter for adjustment and management of vascular access device: Secondary | ICD-10-CM

## 2010-02-23 DIAGNOSIS — C9000 Multiple myeloma not having achieved remission: Secondary | ICD-10-CM

## 2010-02-23 LAB — CMP (CANCER CENTER ONLY)
ALT(SGPT): 19 U/L (ref 10–47)
AST: 26 U/L (ref 11–38)
Albumin: 3.4 g/dL (ref 3.3–5.5)
Alkaline Phosphatase: 39 U/L (ref 26–84)
BUN, Bld: 12 mg/dL (ref 7–22)
Calcium: 9.2 mg/dL (ref 8.0–10.3)
Chloride: 104 mEq/L (ref 98–108)
Potassium: 4.4 mEq/L (ref 3.3–4.7)
Sodium: 143 mEq/L (ref 128–145)
Total Protein: 7 g/dL (ref 6.4–8.1)

## 2010-02-23 LAB — CBC WITH DIFFERENTIAL (CANCER CENTER ONLY)
BASO#: 0 10*3/uL (ref 0.0–0.2)
BASO%: 0.7 % (ref 0.0–2.0)
EOS%: 4.7 % (ref 0.0–7.0)
HGB: 12.1 g/dL — ABNORMAL LOW (ref 13.0–17.1)
LYMPH#: 1.5 10*3/uL (ref 0.9–3.3)
MCHC: 33.9 g/dL (ref 32.0–35.9)
MONO%: 13.3 % — ABNORMAL HIGH (ref 0.0–13.0)
NEUT#: 1.5 10*3/uL (ref 1.5–6.5)
Platelets: 169 10*3/uL (ref 145–400)

## 2010-02-23 NOTE — Medication Information (Signed)
Summary: Interaction with Fluconazole & Simvastatin/[facility]  Interaction with Fluconazole & Simvastatin/[facility]   Imported By: Lanelle Bal 12/28/2009 14:35:59  _____________________________________________________________________  External Attachment:    Type:   Image     Comment:   External Document  Appended Document: Interaction with Fluconazole & Simvastatin/[facility] please fax this to prescriber Lissa Hoard).... see document   Appended Document: Interaction with Fluconazole & Simvastatin/[facility] faxed

## 2010-02-23 NOTE — Progress Notes (Signed)
Summary: need Fluticasone Nasal spray, not cream  Phone Note Refill Request Call back at Home Phone [phone] Message from:  Patient's wife [name] on January 12, 2010 2:08 PM  Refills Requested: Medication #1:  FLUTICASONE PROPIONATE 0.05 % CREA at night.   Notes: per wife--this should be the nasal spray, NOT the Cream when product was picked up in October, it was placed in a drawer and error was not discovered until recently- wife states this should be the nasal spray, not the cream--please call into [facility] on [address]  Next Appointment Scheduled: Mon 1/16   Paz Initial call taken by: Jerolyn Shin,  January 12, 2010 2:10 PM    New/Updated Medications: FLONASE 50 MCG/ACT SUSP (FLUTICASONE PROPIONATE) 2 sprays each nostril once daily. Prescriptions: FLONASE 50 MCG/ACT SUSP (FLUTICASONE PROPIONATE) 2 sprays each nostril once daily.  #1 x 3   Entered by:   Army Fossa CMA   Authorized by:   Nolon Rod. Paz MD   Signed by:   Army Fossa CMA on 01/12/2010   Method used:   Electronically to        [facility] #[id number]* (retail)       [address] [place], [place]  [address]       Ph: [phone]       Fax: [phone]   RxID:   [id number]

## 2010-02-23 NOTE — Progress Notes (Signed)
Summary: does he need to keep appt on 02/06/2010??  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 Initial call taken by: Jerolyn Shin,  January 12, 2010 2:22 PM  Follow-up for Phone Call        Please advise. Gene Hill CMA  January 12, 2010 3:20 PM  if he is feeling well, we can postpone it for March Gene E. Paz MD  January 13, 2010 8:49 AM  Follow-up by:    Additional Follow-up for Phone Call Additional follow up Details #1::        Patient notified. Additional Follow-up by: Gene Hill CMA,  January 13, 2010 3:55 PM

## 2010-03-13 ENCOUNTER — Encounter: Payer: Self-pay | Admitting: Internal Medicine

## 2010-03-13 ENCOUNTER — Other Ambulatory Visit: Payer: Self-pay | Admitting: Hematology & Oncology

## 2010-03-13 ENCOUNTER — Encounter (HOSPITAL_BASED_OUTPATIENT_CLINIC_OR_DEPARTMENT_OTHER): Payer: PRIVATE HEALTH INSURANCE | Admitting: Hematology & Oncology

## 2010-03-13 DIAGNOSIS — C9000 Multiple myeloma not having achieved remission: Secondary | ICD-10-CM

## 2010-03-13 LAB — CBC WITH DIFFERENTIAL (CANCER CENTER ONLY)
EOS%: 2.6 % (ref 0.0–7.0)
Eosinophils Absolute: 0.1 10*3/uL (ref 0.0–0.5)
LYMPH%: 37.4 % (ref 14.0–48.0)
MCH: 32.9 pg (ref 28.0–33.4)
MCHC: 33.8 g/dL (ref 32.0–35.9)
MCV: 97 fL (ref 82–98)
MONO%: 10.8 % (ref 0.0–13.0)
NEUT#: 1.7 10*3/uL (ref 1.5–6.5)
Platelets: 161 10*3/uL (ref 145–400)
RDW: 12.9 % (ref 10.5–14.6)

## 2010-03-13 LAB — CMP (CANCER CENTER ONLY)
AST: 29 U/L (ref 11–38)
Albumin: 3.7 g/dL (ref 3.3–5.5)
Alkaline Phosphatase: 40 U/L (ref 26–84)
Glucose, Bld: 124 mg/dL — ABNORMAL HIGH (ref 73–118)
Potassium: 4.8 mEq/L — ABNORMAL HIGH (ref 3.3–4.7)
Sodium: 142 mEq/L (ref 128–145)
Total Bilirubin: 0.5 mg/dl (ref 0.20–1.60)
Total Protein: 7.4 g/dL (ref 6.4–8.1)

## 2010-03-15 LAB — SPEP & IFE WITH QIG
Alpha-1-Globulin: 3.8 % (ref 2.9–4.9)
Alpha-2-Globulin: 11.9 % — ABNORMAL HIGH (ref 7.1–11.8)
Beta 2: 8.3 % — ABNORMAL HIGH (ref 3.2–6.5)
Gamma Globulin: 9.8 % — ABNORMAL LOW (ref 11.1–18.8)

## 2010-03-15 LAB — BETA 2 MICROGLOBULIN, SERUM: Beta-2 Microglobulin: 1.76 mg/L — ABNORMAL HIGH (ref 1.01–1.73)

## 2010-03-15 NOTE — Letter (Signed)
Summary: doing well---- [facility]  [facility]   Imported By: Maryln Gottron 03/03/2010 14:56:54  _____________________________________________________________________  External Attachment:    Type:   Image     Comment:   External Document

## 2010-03-22 ENCOUNTER — Encounter: Payer: Self-pay | Admitting: Internal Medicine

## 2010-04-06 LAB — DIFFERENTIAL
Basophils Absolute: 0 10*3/uL (ref 0.0–0.1)
Eosinophils Relative: 0 % (ref 0–5)
Lymphocytes Relative: 40 % (ref 12–46)
Lymphs Abs: 1.2 10*3/uL (ref 0.7–4.0)
Monocytes Absolute: 0.6 10*3/uL (ref 0.1–1.0)

## 2010-04-06 LAB — CBC
HCT: 34 % — ABNORMAL LOW (ref 39.0–52.0)
MCV: 96.4 fL (ref 78.0–100.0)
RDW: 15.4 % (ref 11.5–15.5)
WBC: 3.1 10*3/uL — ABNORMAL LOW (ref 4.0–10.5)

## 2010-04-10 ENCOUNTER — Other Ambulatory Visit: Payer: Self-pay | Admitting: Hematology & Oncology

## 2010-04-10 ENCOUNTER — Encounter (HOSPITAL_BASED_OUTPATIENT_CLINIC_OR_DEPARTMENT_OTHER): Payer: PRIVATE HEALTH INSURANCE | Admitting: Hematology & Oncology

## 2010-04-10 DIAGNOSIS — C9000 Multiple myeloma not having achieved remission: Secondary | ICD-10-CM

## 2010-04-10 LAB — CBC WITH DIFFERENTIAL (CANCER CENTER ONLY)
BASO%: 1.7 % (ref 0.0–2.0)
EOS%: 2.5 % (ref 0.0–7.0)
LYMPH%: 34.1 % (ref 14.0–48.0)
MCHC: 33.4 g/dL (ref 32.0–35.9)
MCV: 96 fL (ref 82–98)
MONO#: 0.7 10*3/uL (ref 0.1–0.9)
Platelets: 179 10*3/uL (ref 145–400)
RDW: 14.2 % (ref 11.1–15.7)
WBC: 3.6 10*3/uL — ABNORMAL LOW (ref 4.0–10.0)

## 2010-04-10 LAB — CMP (CANCER CENTER ONLY)
AST: 26 U/L (ref 11–38)
Albumin: 3.5 g/dL (ref 3.3–5.5)
Alkaline Phosphatase: 37 U/L (ref 26–84)
BUN, Bld: 10 mg/dL (ref 7–22)
Potassium: 4 mEq/L (ref 3.3–4.7)

## 2010-04-10 LAB — PROTIME-INR (CHCC SATELLITE)

## 2010-04-12 LAB — BASIC METABOLIC PANEL
CO2: 23 mEq/L (ref 19–32)
Calcium: 8.8 mg/dL (ref 8.4–10.5)
Creatinine, Ser: 0.81 mg/dL (ref 0.40–1.50)
Glucose, Bld: 136 mg/dL — ABNORMAL HIGH (ref 70–99)

## 2010-04-12 LAB — SPEP & IFE WITH QIG
Alpha-1-Globulin: 4 % (ref 2.9–4.9)
Alpha-2-Globulin: 11.7 % (ref 7.1–11.8)
Beta 2: 7.8 % — ABNORMAL HIGH (ref 3.2–6.5)
Beta Globulin: 6.2 % (ref 4.7–7.2)
Gamma Globulin: 10.6 % — ABNORMAL LOW (ref 11.1–18.8)

## 2010-04-12 LAB — BETA 2 MICROGLOBULIN, SERUM: Beta-2 Microglobulin: 1.73 mg/L (ref 1.01–1.73)

## 2010-04-12 LAB — KAPPA/LAMBDA LIGHT CHAINS
Kappa free light chain: 1.36 mg/dL (ref 0.33–1.94)
Kappa:Lambda Ratio: 1.16 (ref 0.26–1.65)

## 2010-04-12 LAB — LACTATE DEHYDROGENASE: LDH: 163 U/L (ref 94–250)

## 2010-04-13 ENCOUNTER — Encounter: Payer: Self-pay | Admitting: Internal Medicine

## 2010-04-13 ENCOUNTER — Ambulatory Visit (INDEPENDENT_AMBULATORY_CARE_PROVIDER_SITE_OTHER): Payer: PRIVATE HEALTH INSURANCE | Admitting: Internal Medicine

## 2010-04-13 DIAGNOSIS — E785 Hyperlipidemia, unspecified: Secondary | ICD-10-CM

## 2010-04-13 DIAGNOSIS — Z9189 Other specified personal risk factors, not elsewhere classified: Secondary | ICD-10-CM

## 2010-04-13 DIAGNOSIS — C9000 Multiple myeloma not having achieved remission: Secondary | ICD-10-CM

## 2010-04-13 DIAGNOSIS — I10 Essential (primary) hypertension: Secondary | ICD-10-CM

## 2010-04-13 DIAGNOSIS — K2270 Barrett's esophagus without dysplasia: Secondary | ICD-10-CM

## 2010-04-13 MED ORDER — RANITIDINE HCL 300 MG PO CAPS: 300.0000 mg | ORAL_CAPSULE | Freq: Every evening | ORAL | Status: DC

## 2010-04-13 MED ORDER — FLUTICASONE PROPIONATE 50 MCG/ACT NA SUSP: 2.0000 | Freq: Every day | NASAL | Status: DC

## 2010-04-13 NOTE — Assessment & Plan Note (Addendum)
He was due for a EGD June 2011, this was postponed due to the diagnosis of myeloma asx , on ranitidine Will re asses on RTC

## 2010-04-13 NOTE — Assessment & Plan Note (Addendum)
was told few days ago he is in remission. On coumadin- famcyclovir  for now b/c revlimid , f/u by oncology Plan is to stay on revlimin x 2 years extensive discussion about coumadin, See instructions .  Today , I spent more than 25 min with the patient, >50% of the time counseling, and /or reviewing the chart and labs ordered by other providers

## 2010-04-13 NOTE — Assessment & Plan Note (Signed)
According to chart review,  Next Colonoscopy  in 2014

## 2010-04-13 NOTE — Patient Instructions (Addendum)
   Vitamin K and Warfarin Warfarin is a drug that helps thin your blood. If you take warfarin, you will need to follow a diet that has a consistent amount of vitamin K-containing foods. Sudden changes in the amount of vitamin K that you eat can cause the medicine to not work as well as it should. You do not need to avoid vitamin K-containing foods. FOODS HIGH IN VITAMIN K Food   Serving Size Broccoli, fresh or frozen, cooked.......................................................1 cup Greens, fresh or frozen, cooked (beet, collard, mustard, turnip)........1/2 cup Kale, fresh or frozen, cooked..............................................................1/2 cup Parsley, raw........................................................................................10 sprigs Spinach, frozen or canned, cooked.....................................................1/2 cup FOODS MODERATELY HIGH IN VITAMIN K Food   Serving Size Bok choy, cooked...............................................................................1 cup Broccoli, raw......................................................................................1 cup Brussels sprouts, fresh or frozen, cooked...........................................1/2 cup Cabbage, cooked................................................................................1 cup Endive, raw........................................................................................1 cup Green leaf lettuce, raw.......................................................................1 cup Green scallions, raw...........................................................................1/4 cup Okra, frozen, cooked..........................................................................1 cup Romaine lettuce, raw..........................................................................1 cup Sauerkraut,  canned.............................................................................1 cup Spinach, raw.......................................................................................1 cup KEEPING YOUR INTAKE CONSISTENT  Note the foods that are high in vitamin K listed above. How many times per week do you eat these foods? Once, twice, or daily?   For example, you may have cooked broccoli 1 time a week and a leafy green salad 3 times a week. In that case, you should have 1 high vitamin K food each week and 3 moderately high foods each week.   Remember, you do not need to eat the same foods each week. Try to keep your intake levels about the same.  MORE TIPS  Take your warfarin as instructed.   It is okay to take a multivitamin that contains vitamin K. Just be sure to take it every day.   Avoid taking herbs unless approved by your caregiver.   Discuss any supplements or whole food supplements with your pharmacist.   If you drink alcohol, drink only in moderation while taking warfarin. If approved by your caregiver, have only 1 to 2 drinks per day. One drink = 5 ounces of wine, 12 ounces of beer, or 1 1/2 ounces of hard liquor.  Document Released: 11/05/2008 Document Re-Released: 04/04/2009 [facility] Patient Information 2011 [place], [place].   Call if the BP is more than 140/80

## 2010-04-13 NOTE — Assessment & Plan Note (Signed)
Good ambulatory BPs , off lisinopril per oncology d/t low BP in December ; will increase metoprolol if BP ~ 140s, so far ~120s

## 2010-04-13 NOTE — Progress Notes (Signed)
  Subjective:    Patient ID: Gene Hill, male    DOB: 28-Apr-1940, 70 y.o.   MRN: [id number]  HPI Multiple Myeloma---was told few days ago he is in remission. On coumadin- famcyclovir  for now b/c revlimid , f/u by oncology  Hyperlipidemia-- off cholesterol meds per oncology d/t stem cell transplant ; was rec to restart as needed  Hypertension--  Good ambulatory BPs , off lisinopril per oncology d/t low BP in December ; will increase metoprolol if BP ~ 140s, so far ~120s Barretts Esophagus  chart reviewed , was due for egd June 2011    Review of Systems  Respiratory: Negative for cough, shortness of breath and wheezing.   Cardiovascular: Negative for chest pain, palpitations and leg swelling.  Gastrointestinal: Negative for nausea, diarrhea and blood in stool.   Past Medical History  Diagnosis Date  . GERD (gastroesophageal reflux disease)   . Arthritis   . Multiple myeloma     dx 03-2009  . HYPERTENSION 05/03/2006  . CORONARY ARTERY DISEASE 05/03/2006    cath 1998, medical managment, cardiolite neg 3-07  . 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 HYPERLIPIDEMIA 05/03/2006  . Carotid bruit     3-11 carotid u/s was  (-)     Past Surgical History: Rotator cuff repair  knee surgery   R  1990s Spinal fusion (Neck) Lumbar fusion BM Bx 3-11  Family History: Father: prostate ca?  kidney ca? colon ca-- no Mother:leukemia Siblings: bro - PE GF - stroke    Social History: Reviewed history from 08/18/2008 and no changes required. Married 4 children Patient has never smoked.  Alcohol Use - yes  less than 1 per day Illicit Drug Use - no Occupation: Retired     Objective:   Physical Exam  Constitutional: He appears well-developed and well-nourished.  Cardiovascular: Regular rhythm and normal heart sounds.   Pulmonary/Chest: Effort normal and breath sounds normal. No respiratory distress. He  has no wheezes. He has no rales.  Musculoskeletal: He exhibits no edema.  Psychiatric: He has a normal mood and affect. His behavior is normal. Judgment and thought content normal.          Assessment & Plan:

## 2010-04-13 NOTE — Assessment & Plan Note (Signed)
off cholesterol meds per oncology d/t stem cell transplant ; was rec to restart as needed , labs on RTC

## 2010-04-16 LAB — BONE MARROW EXAM

## 2010-04-16 LAB — DIFFERENTIAL
Basophils Absolute: 0 10*3/uL (ref 0.0–0.1)
Basophils Relative: 1 % (ref 0–1)
Eosinophils Relative: 3 % (ref 0–5)
Lymphocytes Relative: 41 % (ref 12–46)
Neutro Abs: 2.3 10*3/uL (ref 1.7–7.7)

## 2010-04-16 LAB — TISSUE HYBRIDIZATION (BONE MARROW)-NCBH

## 2010-04-16 LAB — CBC
Platelets: 194 10*3/uL (ref 150–400)
RDW: 13.5 % (ref 11.5–15.5)

## 2010-04-20 NOTE — Letter (Signed)
Summary: [facility]  [facility]   Imported By: Maryln Gottron 04/10/2010 14:05:26  _____________________________________________________________________  External Attachment:    Type:   Image     Comment:   External Document

## 2010-05-08 ENCOUNTER — Encounter (HOSPITAL_BASED_OUTPATIENT_CLINIC_OR_DEPARTMENT_OTHER): Payer: PRIVATE HEALTH INSURANCE | Admitting: Hematology & Oncology

## 2010-05-08 ENCOUNTER — Other Ambulatory Visit: Payer: Self-pay | Admitting: Hematology & Oncology

## 2010-05-08 DIAGNOSIS — C9000 Multiple myeloma not having achieved remission: Secondary | ICD-10-CM

## 2010-05-08 DIAGNOSIS — Z5112 Encounter for antineoplastic immunotherapy: Secondary | ICD-10-CM

## 2010-05-08 LAB — CBC WITH DIFFERENTIAL (CANCER CENTER ONLY)
BASO%: 2 % (ref 0.0–2.0)
Eosinophils Absolute: 0.1 10*3/uL (ref 0.0–0.5)
HCT: 38 % — ABNORMAL LOW (ref 38.7–49.9)
LYMPH#: 1.2 10*3/uL (ref 0.9–3.3)
MONO#: 0.6 10*3/uL (ref 0.1–0.9)
NEUT%: 34 % — ABNORMAL LOW (ref 40.0–80.0)
RBC: 4.02 10*6/uL — ABNORMAL LOW (ref 4.20–5.70)
RDW: 15 % (ref 11.1–15.7)
WBC: 3.1 10*3/uL — ABNORMAL LOW (ref 4.0–10.0)

## 2010-05-08 LAB — CMP (CANCER CENTER ONLY)
ALT(SGPT): 32 U/L (ref 10–47)
CO2: 28 mEq/L (ref 18–33)
Calcium: 9.3 mg/dL (ref 8.0–10.3)
Chloride: 103 mEq/L (ref 98–108)
Creat: 0.9 mg/dl (ref 0.6–1.2)
Glucose, Bld: 102 mg/dL (ref 73–118)
Sodium: 139 mEq/L (ref 128–145)
Total Protein: 7.1 g/dL (ref 6.4–8.1)

## 2010-05-10 LAB — SPEP & IFE WITH QIG
Albumin ELP: 59.8 % (ref 55.8–66.1)
Alpha-1-Globulin: 4 % (ref 2.9–4.9)
Beta 2: 7.5 % — ABNORMAL HIGH (ref 3.2–6.5)
IgM, Serum: 22 mg/dL — ABNORMAL LOW (ref 60–263)

## 2010-05-10 LAB — BETA 2 MICROGLOBULIN, SERUM: Beta-2 Microglobulin: 1.66 mg/L (ref 1.01–1.73)

## 2010-05-10 LAB — KAPPA/LAMBDA LIGHT CHAINS: Lambda Free Lght Chn: 1.09 mg/dL (ref 0.57–2.63)

## 2010-05-15 ENCOUNTER — Encounter (HOSPITAL_BASED_OUTPATIENT_CLINIC_OR_DEPARTMENT_OTHER): Payer: PRIVATE HEALTH INSURANCE | Admitting: Hematology & Oncology

## 2010-05-15 ENCOUNTER — Other Ambulatory Visit: Payer: Self-pay | Admitting: Hematology & Oncology

## 2010-05-15 DIAGNOSIS — C9000 Multiple myeloma not having achieved remission: Secondary | ICD-10-CM

## 2010-05-15 DIAGNOSIS — Z5112 Encounter for antineoplastic immunotherapy: Secondary | ICD-10-CM

## 2010-05-15 LAB — CBC WITH DIFFERENTIAL (CANCER CENTER ONLY)
BASO#: 0 10*3/uL (ref 0.0–0.2)
HCT: 35.3 % — ABNORMAL LOW (ref 38.7–49.9)
HGB: 12 g/dL — ABNORMAL LOW (ref 13.0–17.1)
LYMPH#: 1 10*3/uL (ref 0.9–3.3)
MONO#: 0.6 10*3/uL (ref 0.1–0.9)
NEUT%: 60.2 % (ref 40.0–80.0)
WBC: 4.5 10*3/uL (ref 4.0–10.0)

## 2010-05-22 ENCOUNTER — Other Ambulatory Visit: Payer: Self-pay | Admitting: Hematology & Oncology

## 2010-05-22 ENCOUNTER — Encounter (HOSPITAL_BASED_OUTPATIENT_CLINIC_OR_DEPARTMENT_OTHER): Payer: PRIVATE HEALTH INSURANCE | Admitting: Hematology & Oncology

## 2010-05-22 DIAGNOSIS — C9000 Multiple myeloma not having achieved remission: Secondary | ICD-10-CM

## 2010-05-22 LAB — CBC WITH DIFFERENTIAL (CANCER CENTER ONLY)
BASO#: 0 10*3/uL (ref 0.0–0.2)
EOS%: 4.6 % (ref 0.0–7.0)
HCT: 35.9 % — ABNORMAL LOW (ref 38.7–49.9)
HGB: 12 g/dL — ABNORMAL LOW (ref 13.0–17.1)
MCH: 31.5 pg (ref 28.0–33.4)
MCHC: 33.4 g/dL (ref 32.0–35.9)
MONO%: 12.5 % (ref 0.0–13.0)
NEUT%: 45.9 % (ref 40.0–80.0)

## 2010-06-09 NOTE — Discharge Summary (Signed)
NAME:  Gene Hill, VANHISE NO.:  [id number]   MEDICAL RECORD NO.:  [id number]                   PATIENT TYPE:  INP   LOCATION:  3013                                 FACILITY:  [facility]   PHYSICIAN:  Stefani Dama, M.D.               DATE OF BIRTH:  Jul 20, 1940   DATE OF ADMISSION:  02/25/2003  DATE OF DISCHARGE:  03/01/2003                                 DISCHARGE SUMMARY   ADMISSION DIAGNOSES:  L4-L5 spondylolisthesis with lumbar radiculopathy and  spinal stenosis.   DISCHARGE AND FINAL DIAGNOSES:  L4-L5 spondylolisthesis with lumbar  radiculopathy and spinal stenosis.   PROCEDURE:  L4 Gill procedure decompression of nerve roots and spinal  stenosis, bilateral interbody prosthesis, (PEEK cages), posterior  nonsegmental instrumentation, posterior lateral arthrodesis with local  autograft and allograft.   CONDITION ON DISCHARGE:  Improving.   HOSPITAL COURSE:  Mr. Daudelin is a 70 year old individual who has had  significant back and lower extremity pain.  He has a severe  spondylolisthesis with stenosis and neurological impairment and has been  advised regarding surgical decompression.  This was undertaken on February 25, 2003.  Postoperatively the patient has been ambulatory and independent.  His incision is clean and dry.  He has had spontaneous movement of his  bowels.  He has good control of his faculties.  At this time he is  discharged home with a prescription for Percocet without refills, Valium 5  mg #40 without refills.  The patient will be seen in the office in  approximately 3 weeks time by Dr. Lovell Sheehan as needed for follow up care.                                                Stefani Dama, M.D.    Merla Riches  D:  03/01/2003  T:  03/01/2003  Job:  [id number]

## 2010-06-09 NOTE — Op Note (Signed)
NAME:  TC, KAPUSTA NO.:  [id number]   MEDICAL RECORD NO.:  [id number]                   PATIENT TYPE:  INP   LOCATION:  3172                                 FACILITY:  [facility]   PHYSICIAN:  Cristi Loron, M.D.            DATE OF BIRTH:  02/15/1940   DATE OF PROCEDURE:  02/25/2003  DATE OF DISCHARGE:                                 OPERATIVE REPORT   PREOPERATIVE DIAGNOSES:  1. L4-5 grade 1 to 2 acquired spondylolisthesis.  2. Spinal stenosis.  3. Degenerative disk disease.  4. Lumbago and lumbar radiculopathy.   POSTOPERATIVE DIAGNOSES:  1. L4-5 grade 1 to 2 acquired spondylolisthesis.  2. Spinal stenosis.  3. Degenerative disk disease.  4. Lumbago and lumbar radiculopathy.   PROCEDURE:  1. L4 Gill procedure.  2. L4-5 posterior lumbar interbody fusion.  3. Insertion of bilateral L4-5 interbody prosthesis (PEEK cages).  4. Posterior nonsegmental instrumentation with Legacy M-10 titanium pedicle     screws and rods.  5. Posterolateral arthrodesis with local morselized autograft bone and     platelet rich gel.   SURGEON:  Cristi Loron, M.D.   ASSISTANT:  Stefani Dama, M.D.   ANESTHESIA:  General endotracheal anesthesia.   ESTIMATED BLOOD LOSS:  200 mL.   SPECIMENS:  None.   DRAINS:  None.   COMPLICATIONS:  None.   INDICATIONS FOR PROCEDURE:  The patient is a 70 year old white male who  suffers from back and leg pain. He failed medical management  and was worked  up with a lumbar MRI which demonstrated a grade 1 acquired spondylolisthesis  at L4-5 with degenerative disease and  severe  spinal stenosis. I discussed  the various treatment options with the patient and his wife, including  surgery. The patient  and his wife weighed the  risks, benefits and  alternatives to surgery and decide to proceed with a lumbar decompression  and fusion.   DESCRIPTION OF PROCEDURE:  The patient was brought to the operating room by  the anesthesia team and general endotracheal anesthesia was induced. The  patient was turned to the prone position  on the Wilson frame. His  lumbosacral region was then shaved and prepared with Betadine scrub and  Betadine solution. Sterile drapes were applied. I then injected the area to  be incised with Marcaine with epinephrine solution.   I used the scalpel to make a linear midline incision over the L4-5  interspace. I used electrocautery to dissect down to the thoracolumbar  fascia. I divided the fascia bilaterally, performing a bilateral  subperiosteal dissection, stripping the paraspinous musculature  from the  spinous process lamina of L4 and L5. I inserted the Versatrac retractor for  exposure and obtained an intraoperative radiograph to confirm our location.   We began by incising L4-5 and L4 _________ with a scalpel. We then used a  Leksell rongeur to remove the spinous process of  L4 and part of the L4  lamina. This bone was saved and cleared of soft tissue and later used in the  fusion process. We used the high-speed drill to perform bilateral L4  laminotomies. We then completed the L4 laminectomy and Gill procedure with  the Kerrison punch, removing the remainder of the L4 lamina and the L4-5 and  L3-4 ligament of Flavum. We performed  generous foraminotomies about the  bilateral L4 and 5 nerve roots. There was considerable lateral recess  stenosis compressing the L5 nerve roots as well as foraminal stenosis  compressing  the L4 nerve roots.   Having completed the decompression, we now turned attention to the fusion.  We freed up the  thecal sac and  the nerve roots from the epidural tissue  and gently retracted them medially with the D'Errico nerve root retractor,  exposing the intervertebral disk. We incised the intervertebral disk with a  #15 blade  scalpel and then performed aggressive diskectomy using pituitary  forceps and the Epstein curets and Scoville curets. We  then distracted the  interspace with an interbody spreader and then prepared their contralateral  vertebral endplates for the fusion using the rasp and curets, cleaning soft  tissue from the vertebral endplates.   We then inserted a 10 x 22 mm PEEK cage prefilled with a combination of  localized morselized autograft bone and platelet rich gel. We inserted that  into the distracted L4-5 interspace, of course after retracting the neural  structures out of harm's way. We then removed  the distractor from the  contralateral side and then placed a 2nd PEEK cage on the other side. We  then filled it in between  and lateral  to the PEEK cages with a combination  of local morselized autograft bone and platelet rich gel, completing the  posterior lumbar interbody fusion.   We now turned our attention to the posterior instrumentation. We used  electrocautery to expose the bilateral transverse processes at L4 and L5. We  then decorticated posterior to the bilateral L4 and L5 pedicles with a high-  speed drill and then we cannulated the pedicles with the bone probes with  the use of the fluoroscopy and the Fluoro-Nav system. We then tapped the  pedicles, felt inside the tapped pedicles, then I placed the 6.5 x 45 mm  pedicle screws bilaterally at L4 and L5. We then palpated along the medial  aspect of the L4 and L5 pedicles and noted that there were no cortical  breeches, and that the L4 and L5 nerve roots had not been injured. We  connected the unilateral pedicle screws with a lordotic rod and then  compressed the construct and secured the rod in place using the caps which  we torqued appropriately. This was completed the instrumentation.   We now  turned our attention to the posterolateral arthrodesis. We used the  high-speed drill to decorticate the bilateral L4 and L5 transverse processes as well as the remainder of the L4-5 facet and pars, and we removed the  synovium from the L4-5 facet  joints. We then laid a combination of local  morselized autograft bone and platelet rich gel over the decorticated  posterolateral structures, completing the posterolateral arthrodesis.   We then inspected the sac and the bilateral L4 and L5 nerve roots and noted  that they were well decompressed. We then removed the Versatrac retractor  and reapproximated the patient's thoracolumbar fascia with interrupted #1  Vicryl suture, the  subcutaneous  tissue with interrupted 2-0 Vicryl suture  and the skin with Steri-Strips and Benzoin. The wound was then coated with  Bacitracin ointment. Sterile dressings were applied.   The drapes  were removed. The patient was  subsequently returned to the  supine position  where  he was extubated by the anesthesia team and  transported to the post anesthesia care unit in stable condition. All  sponge, instrument and needle counts were correct at the end of the case.                                               Cristi Loron, M.D.    JDJ/MEDQ  D:  02/25/2003  T:  02/26/2003  Job:  [id number]

## 2010-06-09 NOTE — Op Note (Signed)
NAME:  Gene Hill, Gene Hill NO.:  [id number]   MEDICAL RECORD NO.:  [id number]                   PATIENT TYPE:  OBV   LOCATION:  0478                                 FACILITY:  [facility]   PHYSICIAN:  Marlowe Kays, M.D.               DATE OF BIRTH:  1940/10/29   DATE OF PROCEDURE:  06/08/2003  DATE OF DISCHARGE:                                 OPERATIVE REPORT   PREOPERATIVE DIAGNOSIS:  1. Degenerative arthritis, acromioclavicular joint.  2. Torn rotator cuff with retraction, right shoulder.   POSTOPERATIVE DIAGNOSIS:  1. Degenerative arthritis, acromioclavicular joint.  2. Torn rotator cuff with retraction, right shoulder.   OPERATION:  1. Open resection, distal right clavicle.  2. Anterior acromionectomy and repair of torn rotator cuff, right shoulder.   SURGEON:  Marlowe Kays, M.D.   ASSISTANT:  French Ana A. Shuford, P.A.-C. for most of the case and then Clarene Reamer, PA-C.   ANESTHESIA:  General.   INDICATIONS FOR PROCEDURE:  He has had many-month history of persistent  right shoulder pain, which was somewhat camouflaged by cervical disk, which  when corrected with the shoulder pain persisting, leading to an MRI on April 13, 2003, which demonstrated a full-thickness tear of the rotator cuff, and  partial tear of the [facility] joint and some significant hypertrophic changes of  the procedure.  He is suffering significant pain and is here today for the  above-mentioned surgery.  See operative description below for additional  details.   PROCEDURE:  Prophylactic antibiotics.  Satisfactory general anesthesia.  Beach-chair position on the ____________ frame.  The right rotator cuff was  prepped with Duraprep and draped in the sterile field.  Ioban employed.  I  marked out an incision along the distal clavicle and then down vertically  over the rotator cuff.  With subperiosteal dissection, I exposed the distal  clavicle and AC joint, which was  completely worn out with some fragments of  bone in the joint.  It measured 1.5 medial to the [facility] joint and undermined  the clavicle at this point, marked it with cutting cautery, and then  protected the underlying structures.  Used a micro saw to osteotomize the  clavicle.  I removed it with towel clip and cautery dissection.  Small  spicules of bone were removed from the distal clavicle.  The well bone was  covered with bone wax.  I then continued the dissection lateral-ward with  subperiosteal dissection, exposing the anterior acromion.  I undermined it,  and it was very tight, with small Cobb follow by a larger Cobb and then made  my initial anterior acromionectomy.  He had significant impingement syndrome  requiring additional underneath surface resection of bone with micro saw and  small rongeur.  When I corrected the impingement, then looked for the  rotator cuff, which was not immediately apparent, although the sunken area  was noted about the  size of my thumbnail at the greater tuberosity.  This  corresponded to the MRI, and I opened the superficial rotator cuff fibers,  at this point, transversely, and they did find a large rotator cuff tear  beneath with retraction.  I opened the rotator cuff until we had adequate  exposure for about 2 cm with 1-2 cm of retraction.  I was able to grasp the  end of these fibers with hemostats and with the arm abducted, returned the  rotator cuff to the greater tuberosity.  I then placed a super Mitek anchor  through the greater tuberosity and with two wings well up into the  underneath-surface fibers of the rotator cuff.  Before tying it, I under  direct visualization placed #1 Ethibond sutures both anteriorly and  posteriorly through both layers of the rotator cuff.  With retraction on  them, I then tied the Mitek anchor suture down tightly and then the  supplementary sutures until there was a nice tight lateral repair of the  rotator cuff, which  was stable with his arm to his side.  The wound was then  warmly irrigated with sterile saline.  He had an interscalene block, so I  did not use Marcaine with Adrenaline at the conclusion of the case, as I  normally would.  I packed the distal clavicle excision site with Gelfoam and  then closed the periosteal fascial layer over the top with interrupted #1  Vicryl.  Moving laterally, I closed the deltoid muscle with one layer of the  same and then finally closed the mid portion over the anterior  acromionectomy incision site with a nice, tight closure.  Subcutaneous  tissue was closed with 2-0 Vicryl and the skin with Steri-Strips.  A dry  sterile dressing and shoulder immobilizer were applied.  He tolerated the  procedure well.  He was taken to the recovery room in satisfactory condition  with no complications.                                               Marlowe Kays, M.D.    JA/MEDQ  D:  06/08/2003  T:  06/08/2003  Job:  [id number]

## 2010-06-09 NOTE — Op Note (Signed)
NAME:  Gene Hill, Gene Hill NO.:  [id number]   MEDICAL RECORD NO.:  [id number]                   PATIENT TYPE:  INP   LOCATION:  2899                                 FACILITY:  [facility]   PHYSICIAN:  Cristi Loron, M.D.            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   PROCEDURES:  1. C5-6 and C6-7 extensive anterior cervical diskectomy/decompression.  2. Interbody iliac crest allograft arthrodesis.  3. Anterior cervical plating (Synthes titanium plate and screws).   SURGEON:  Cristi Loron, M.D.   ASSISTANT:  Stefani Dama, M.D.   ANESTHESIA:  General endotracheal.   ESTIMATED BLOOD LOSS:  200 mL.   SPECIMENS:  None.   DRAINS:  None.   COMPLICATIONS:  None.   BRIEF HISTORY:  The patient is a 70 year old white male who has suffered  from neck and right greater than left shoulder and arm pain.  He has failed  medical management and was worked up with a cervical MRI, which demonstrated  a C5-6 and C6-7 degenerative disk disease, spondylosis, stenosis, herniated  disk, etc.  I discussed the various treatment options with him, including  surgery.  The patient weighed the risks, benefits, and alternatives of  surgery and decided to proceed with an anterior cervical diskectomy, fusion,  and plating.   DESCRIPTION OF PROCEDURE:  The patient was brought to the operating room by  the anesthesia team.  General endotracheal anesthesia was induced.  The  patient remained in the supine position.  A roll was placed under his  shoulders to  place his neck in slight extension.  His anterior cervical  region was then prepared with Betadine scrub and Betadine solution.  Sterile  drapes were applied.  I then injected the area to be incised with Marcaine  with epinephrine solution.  I used a scalpel to make a transverse incision  in the patient's left anterior neck.  I used the Metzenbaum scissors to  divide the platysma muscle and then to dissect medial to the  sternocleidomastoid muscle, jugular vein, and carotid artery.  I bluntly  dissected down toward the anterior cervical spine, carefully identifying the  esophagus, and retracting it medially.  I cleared the soft tissue from the  anterior cervical spine using Kitner swabs and then inserted a bent spinal  needle into the upper exposed interspace.  Then I obtained the  intraoperative radiograph.  It demonstrated the needle was in the C5-6.  I  then used the electrocautery to detach the medial border off the longus  colli muscle bilaterally from  the C5-6 and C6-7 intervertebral disk space  and then inserted the Caspar self-retaining retractor for exposure.  We  began at C6-7.  We incised the C6-7 intervertebral disk with a 15 blade  scalpel and performed a partial diskectomy using the pituitary forceps.  We  then inserted distraction screws at C6 and C7, distracted the interspace,  and then used the high-speed drill to decorticate the vertebral end plates  at W1-1, decompress, and drill away the remainder of the C6-7 intervertebral  disk.  We thinned out the posterior longitudinal ligament with a drill and  drilled away some posterior spondylosis.  We then cut the thinned ligament  with the arachnoid knife and then removed it with the Kerrison punch,  undercutting the vertebral end plates at B1-4, decompressing the thecal sac.  We then performed a generous foraminotomy about the bilateral C7 nerve  roots, decompressing the nerve roots on each side.  We then repeated this   procedure in a similar fashion at C5-6.   We now turned our attention to the interbody iliac crest allograft  arthrodesis.  We obtained the bone graft and fashioned it to these  approximate dimensions:  7 mm in height and 1 cm in depth.  We inserted the  one bone graft into the distracted C5-6 interspace and then removed the  distraction screw from C5, placed it in C7, distracted C6-7, and placed a  similarly shaped and sized bone graft at the C6-7 interspace.  There was a  good, snug fit of bone graft at both levels.  We removed the distraction  screws.   We now turned our attention to anterior spinal instrumentation.  We used the  high-speed drill to remove some ventral spondylosis so the plate would lay  down flat.  We then chose the appropriate length Synthes anterior plate,  laid it along the anterior aspect of the vertebral bodies from C5 to C7,  drilled two holes at C5, two at C6, two at C7, tapped the holes, and then  secured the plate to the vertebral bodies by placing two 14 mm screws at  each vertebral body.  We then obtained the intraoperative radiograph.  It  demonstrated good position of the upper plate and screws but because of the  patient's body habitus we could not see the lower plate and screws on the  radiograph, but they looked good in vivo.  We then secured the screws to the  plate by placing a locking screw at each screw.  We then achieved stringent  hemostasis using bipolar electrocautery and Gelfoam.  We copiously irrigated  the wound out with bacitracin solution.  We removed the solution and then  removed the Caspar self-retaining retractor.  We inspected the esophagus for  any damage.  There was none apparent.  We then reapproximated the patient's  platysma muscle with interrupted 3-0 Vicryl suture, the subcutaneous tissue  using interrupted 3-0 Vicryl suture, and the skin with Steri-Strips and Benzoin.  The wound was then covered with bacitracin ointment and  sterile  dressings were applied, the drapes were removed, and the patient was  subsequently extubated by the anesthesia team and transported to the  postanesthesia care unit in stable condition.  All sponge, instrument, and  needle counts were correct at the end of this case.  Cristi Loron, M.D.    JDJ/MEDQ  D:  04/22/2002  T:  04/23/2002  Job:  [id number]

## 2010-06-12 ENCOUNTER — Other Ambulatory Visit: Payer: Self-pay | Admitting: Hematology & Oncology

## 2010-06-12 ENCOUNTER — Encounter (HOSPITAL_BASED_OUTPATIENT_CLINIC_OR_DEPARTMENT_OTHER): Payer: PRIVATE HEALTH INSURANCE | Admitting: Hematology & Oncology

## 2010-06-12 DIAGNOSIS — C9000 Multiple myeloma not having achieved remission: Secondary | ICD-10-CM

## 2010-06-12 DIAGNOSIS — Z5112 Encounter for antineoplastic immunotherapy: Secondary | ICD-10-CM

## 2010-06-12 LAB — CBC WITH DIFFERENTIAL (CANCER CENTER ONLY)
BASO%: 0.9 % (ref 0.0–2.0)
EOS%: 3 % (ref 0.0–7.0)
HCT: 36.6 % — ABNORMAL LOW (ref 38.7–49.9)
LYMPH#: 0.8 10*3/uL — ABNORMAL LOW (ref 0.9–3.3)
LYMPH%: 35.1 % (ref 14.0–48.0)
MCHC: 33.9 g/dL (ref 32.0–35.9)
MCV: 96 fL (ref 82–98)
NEUT%: 45.4 % (ref 40.0–80.0)
Platelets: 113 10*3/uL — ABNORMAL LOW (ref 145–400)
RDW: 16.2 % — ABNORMAL HIGH (ref 11.1–15.7)
WBC: 2.3 10*3/uL — ABNORMAL LOW (ref 4.0–10.0)

## 2010-06-14 LAB — SPEP & IFE WITH QIG
Albumin ELP: 59.6 % (ref 55.8–66.1)
Alpha-1-Globulin: 3.9 % (ref 2.9–4.9)
Beta 2: 7.6 % — ABNORMAL HIGH (ref 3.2–6.5)
IgM, Serum: 21 mg/dL — ABNORMAL LOW (ref 60–263)

## 2010-06-14 LAB — COMPREHENSIVE METABOLIC PANEL
ALT: 28 U/L (ref 0–53)
Albumin: 4.4 g/dL (ref 3.5–5.2)
CO2: 23 mEq/L (ref 19–32)
Calcium: 9 mg/dL (ref 8.4–10.5)
Chloride: 103 mEq/L (ref 96–112)
Sodium: 140 mEq/L (ref 135–145)
Total Protein: 6.8 g/dL (ref 6.0–8.3)

## 2010-06-14 LAB — LACTATE DEHYDROGENASE: LDH: 151 U/L (ref 94–250)

## 2010-06-20 ENCOUNTER — Other Ambulatory Visit: Payer: Self-pay | Admitting: Hematology & Oncology

## 2010-06-20 ENCOUNTER — Encounter (HOSPITAL_BASED_OUTPATIENT_CLINIC_OR_DEPARTMENT_OTHER): Payer: PRIVATE HEALTH INSURANCE | Admitting: Hematology & Oncology

## 2010-06-20 DIAGNOSIS — C9000 Multiple myeloma not having achieved remission: Secondary | ICD-10-CM

## 2010-06-20 DIAGNOSIS — Z5112 Encounter for antineoplastic immunotherapy: Secondary | ICD-10-CM

## 2010-06-20 LAB — CBC WITH DIFFERENTIAL (CANCER CENTER ONLY)
BASO%: 0.8 % (ref 0.0–2.0)
EOS%: 4.5 % (ref 0.0–7.0)
HCT: 34.6 % — ABNORMAL LOW (ref 38.7–49.9)
LYMPH%: 41.7 % (ref 14.0–48.0)
MCHC: 33.5 g/dL (ref 32.0–35.9)
MCV: 97 fL (ref 82–98)
MONO#: 0.5 10*3/uL (ref 0.1–0.9)
NEUT%: 34 % — ABNORMAL LOW (ref 40.0–80.0)
RDW: 16.6 % — ABNORMAL HIGH (ref 11.1–15.7)

## 2010-06-26 ENCOUNTER — Encounter (HOSPITAL_BASED_OUTPATIENT_CLINIC_OR_DEPARTMENT_OTHER): Payer: PRIVATE HEALTH INSURANCE | Admitting: Hematology & Oncology

## 2010-06-26 DIAGNOSIS — C9000 Multiple myeloma not having achieved remission: Secondary | ICD-10-CM

## 2010-06-26 DIAGNOSIS — Z5112 Encounter for antineoplastic immunotherapy: Secondary | ICD-10-CM

## 2010-07-24 ENCOUNTER — Other Ambulatory Visit: Payer: Self-pay | Admitting: Hematology & Oncology

## 2010-07-24 ENCOUNTER — Encounter (HOSPITAL_BASED_OUTPATIENT_CLINIC_OR_DEPARTMENT_OTHER): Payer: PRIVATE HEALTH INSURANCE | Admitting: Hematology & Oncology

## 2010-07-24 DIAGNOSIS — Z5112 Encounter for antineoplastic immunotherapy: Secondary | ICD-10-CM

## 2010-07-24 DIAGNOSIS — Z5111 Encounter for antineoplastic chemotherapy: Secondary | ICD-10-CM

## 2010-07-24 DIAGNOSIS — C9000 Multiple myeloma not having achieved remission: Secondary | ICD-10-CM

## 2010-07-24 LAB — CBC WITH DIFFERENTIAL (CANCER CENTER ONLY)
EOS%: 3.4 % (ref 0.0–7.0)
Eosinophils Absolute: 0.1 10*3/uL (ref 0.0–0.5)
LYMPH%: 40.8 % (ref 14.0–48.0)
MCH: 33.6 pg — ABNORMAL HIGH (ref 28.0–33.4)
MCHC: 34 g/dL (ref 32.0–35.9)
MCV: 99 fL — ABNORMAL HIGH (ref 82–98)
MONO%: 17.2 % — ABNORMAL HIGH (ref 0.0–13.0)
NEUT#: 0.9 10*3/uL — ABNORMAL LOW (ref 1.5–6.5)
Platelets: 105 10*3/uL — ABNORMAL LOW (ref 145–400)
RDW: 15.9 % — ABNORMAL HIGH (ref 11.1–15.7)

## 2010-07-27 LAB — COMPREHENSIVE METABOLIC PANEL
ALT: 31 U/L (ref 0–53)
Albumin: 4.2 g/dL (ref 3.5–5.2)
Alkaline Phosphatase: 37 U/L — ABNORMAL LOW (ref 39–117)
CO2: 24 mEq/L (ref 19–32)
Glucose, Bld: 101 mg/dL — ABNORMAL HIGH (ref 70–99)
Potassium: 3.7 mEq/L (ref 3.5–5.3)
Sodium: 138 mEq/L (ref 135–145)
Total Bilirubin: 0.3 mg/dL (ref 0.3–1.2)
Total Protein: 6.4 g/dL (ref 6.0–8.3)

## 2010-07-27 LAB — SPEP & IFE WITH QIG
Gamma Globulin: 13.9 % (ref 11.1–18.8)
IgG (Immunoglobin G), Serum: 868 mg/dL (ref 650–1600)
IgM, Serum: 20 mg/dL — ABNORMAL LOW (ref 41–251)
Total Protein, Serum Electrophoresis: 6.4 g/dL (ref 6.0–8.3)

## 2010-07-27 LAB — BETA 2 MICROGLOBULIN, SERUM: Beta-2 Microglobulin: 1.63 mg/L (ref 1.01–1.73)

## 2010-07-31 ENCOUNTER — Encounter (HOSPITAL_BASED_OUTPATIENT_CLINIC_OR_DEPARTMENT_OTHER): Payer: PRIVATE HEALTH INSURANCE | Admitting: Hematology & Oncology

## 2010-07-31 DIAGNOSIS — Z5111 Encounter for antineoplastic chemotherapy: Secondary | ICD-10-CM

## 2010-07-31 DIAGNOSIS — C9000 Multiple myeloma not having achieved remission: Secondary | ICD-10-CM

## 2010-08-07 ENCOUNTER — Encounter (HOSPITAL_BASED_OUTPATIENT_CLINIC_OR_DEPARTMENT_OTHER): Payer: PRIVATE HEALTH INSURANCE | Admitting: Hematology & Oncology

## 2010-08-07 ENCOUNTER — Other Ambulatory Visit: Payer: Self-pay | Admitting: Hematology & Oncology

## 2010-08-07 DIAGNOSIS — Z5112 Encounter for antineoplastic immunotherapy: Secondary | ICD-10-CM

## 2010-08-07 DIAGNOSIS — C9000 Multiple myeloma not having achieved remission: Secondary | ICD-10-CM

## 2010-08-07 DIAGNOSIS — Z5111 Encounter for antineoplastic chemotherapy: Secondary | ICD-10-CM

## 2010-08-07 LAB — CBC WITH DIFFERENTIAL (CANCER CENTER ONLY)
BASO%: 0.8 % (ref 0.0–2.0)
LYMPH%: 31.5 % (ref 14.0–48.0)
MCV: 98 fL (ref 82–98)
MONO#: 0.6 10*3/uL (ref 0.1–0.9)
NEUT#: 1.9 10*3/uL (ref 1.5–6.5)
Platelets: 120 10*3/uL — ABNORMAL LOW (ref 145–400)
RDW: 15 % (ref 11.1–15.7)
WBC: 3.8 10*3/uL — ABNORMAL LOW (ref 4.0–10.0)

## 2010-08-14 ENCOUNTER — Encounter: Payer: Self-pay | Admitting: Internal Medicine

## 2010-08-14 ENCOUNTER — Ambulatory Visit (INDEPENDENT_AMBULATORY_CARE_PROVIDER_SITE_OTHER): Payer: PRIVATE HEALTH INSURANCE | Admitting: Internal Medicine

## 2010-08-14 DIAGNOSIS — E785 Hyperlipidemia, unspecified: Secondary | ICD-10-CM

## 2010-08-14 DIAGNOSIS — I2510 Atherosclerotic heart disease of native coronary artery without angina pectoris: Secondary | ICD-10-CM

## 2010-08-14 DIAGNOSIS — C9000 Multiple myeloma not having achieved remission: Secondary | ICD-10-CM

## 2010-08-14 DIAGNOSIS — Z0000 Encounter for general adult medical examination without abnormal findings: Secondary | ICD-10-CM | POA: Insufficient documentation

## 2010-08-14 DIAGNOSIS — Z9189 Other specified personal risk factors, not elsewhere classified: Secondary | ICD-10-CM

## 2010-08-14 DIAGNOSIS — I10 Essential (primary) hypertension: Secondary | ICD-10-CM

## 2010-08-14 LAB — LIPID PANEL
Cholesterol: 231 mg/dL — ABNORMAL HIGH (ref 0–200)
VLDL: 89.8 mg/dL — ABNORMAL HIGH (ref 0.0–40.0)

## 2010-08-14 MED ORDER — METOPROLOL TARTRATE 100 MG PO TABS: 50.0000 mg | ORAL_TABLET | Freq: Two times a day (BID) | ORAL | Status: DC

## 2010-08-14 MED ORDER — RANITIDINE HCL 300 MG PO CAPS: 300.0000 mg | ORAL_CAPSULE | Freq: Every evening | ORAL | Status: DC

## 2010-08-14 MED ORDER — FLUTICASONE PROPIONATE 50 MCG/ACT NA SUSP: 2.0000 | Freq: Every day | NASAL | Status: DC

## 2010-08-14 NOTE — Progress Notes (Signed)
  Subjective:    Patient ID: Gene Hill, male    DOB: 06-03-1940, 70 y.o.   MRN: [id number]  HPI  Here for Medicare AWV:  1. Risk factors based on Past M, S, F history: reviewed 2. Physical Activities: still active but has slow down due to chemo, still does garden work 3. Depression/mood:  No problems noted or reported  4. Hearing: slt decreased over time   5. ADL's:  Independent  6. Fall Risk: no recent problems  7. home Safety: does feelsafe at home  8. Height, weight, &visual acuity: see VS, uses glasses  9. Counseling: provided 10. Labs ordered based on risk factors: if needed  11. Referral Coordination: if needed 12.  Care Plan, see assessment and plan  13.   Cognitive Assessment: Motor skills and cognition within normal  In addition, today we discussed the following: MM-- cancer resurfaced 04-2010, getting chemotherapy, next Bx 08-2010 HTN-- BP was low, ACEi d/c, BB decreased by oncology GERD-- not an issue at present    Past Medical History  Diagnosis Date  . GERD (gastroesophageal reflux disease)   . Arthritis   . Multiple myeloma     dx 03-2009  . HYPERTENSION 05/03/2006  . CORONARY ARTERY DISEASE 05/03/2006    cath 1998, medical managment, cardiolite neg 3-07  . 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 HYPERLIPIDEMIA 05/03/2006  . Carotid bruit     3-11 carotid u/s was  (-)   Past Surgical History  Procedure Date  . Rotator cuff repair   . Knee surgery 1990    right  . Spinal fusion     neck    Family History: Father: prostate ca? kidney ca? colon ca-- no Mother:leukemia Siblings: bro - PE GF - stroke    Social History: Married 4 children Patient has never smoked.  Alcohol Use - hardly ever  Illicit Drug Use - no Occupation: Retired   Review of Systems  Respiratory: Negative for cough and shortness of breath.   Cardiovascular: Negative for chest pain,  palpitations and leg swelling.  Gastrointestinal: Negative for abdominal pain and blood in stool.  Genitourinary: Negative for dysuria and hematuria.       Objective:   Physical Exam  Constitutional: He is oriented to person, place, and time. He appears well-developed. No distress.  HENT:  Head: Normocephalic and atraumatic.  Neck:       Normal carotid pulses  Cardiovascular: Normal rate, regular rhythm and normal heart sounds.   No murmur heard. Pulmonary/Chest: Effort normal and breath sounds normal. No respiratory distress. He has no wheezes. He has no rales.  Abdominal: Soft. Bowel sounds are normal. He exhibits no distension. There is no tenderness. There is no rebound.  Genitourinary:       DRE deferred  Musculoskeletal:       Atrophic left leg which is smaller than the right. No edema  Neurological: He is alert and oriented to person, place, and time.  Skin: Skin is warm. He is not diaphoretic.  Psychiatric: He has a normal mood and affect. His behavior is normal. Judgment and thought content normal.          Assessment & Plan:

## 2010-08-14 NOTE — Patient Instructions (Signed)
Please discuss with your oncologist  if you need a tetanus shot.

## 2010-08-14 NOTE — Assessment & Plan Note (Signed)
disease resurface in April 2012, undergoing chemotherapy

## 2010-08-14 NOTE — Assessment & Plan Note (Addendum)
Td 2002? I am somehow hesitant to prescribe it today due to active chemotherapy. Asked her to discuss with oncology. pneumonia shot 2008 never had a shingles shot, currently on chemo for MM DRE and PSA normal 03/2009; DER deferred today, check a PSA had a colonoscopy in 2004.  The found diverticuli.  No polyps.  NEXT Cscope 2014  Labs reviewed , will recheck a FLP  diet  and exercise discussed

## 2010-08-14 NOTE — Assessment & Plan Note (Signed)
Off beta blockers and ACE inhibitors due to low blood pressure. BP today normal

## 2010-08-14 NOTE — Assessment & Plan Note (Signed)
Asymptomatic. 

## 2010-08-14 NOTE — Assessment & Plan Note (Addendum)
oncology discontinued statins, patient would like to go back to it if possible. We'll check a FLP, consider restart a statin.

## 2010-08-16 ENCOUNTER — Telehealth: Payer: Self-pay | Admitting: *Deleted

## 2010-08-16 MED ORDER — SIMVASTATIN 40 MG PO TABS: 40.0000 mg | ORAL_TABLET | Freq: Every evening | ORAL | Status: DC

## 2010-08-16 NOTE — Telephone Encounter (Signed)
Pt is aware.  

## 2010-08-16 NOTE — Telephone Encounter (Signed)
Message copied by Leanne Lovely on Wed Aug 16, 2010 10:15 AM ------      Message from: Willow Ora E      Created: Wed Aug 16, 2010  7:59 AM       Advised patient to:      His cholesterol is definitely worse without cholesterol medication. He was on a high dose of simvastatin ( 80 mg) I recommend to go back to a lower dose  of simvastatin 40 mg daily.

## 2010-09-12 ENCOUNTER — Other Ambulatory Visit: Payer: Self-pay | Admitting: Hematology & Oncology

## 2010-09-12 ENCOUNTER — Encounter (HOSPITAL_COMMUNITY): Payer: PRIVATE HEALTH INSURANCE | Attending: Hematology & Oncology

## 2010-09-12 DIAGNOSIS — Z79899 Other long term (current) drug therapy: Secondary | ICD-10-CM | POA: Insufficient documentation

## 2010-09-12 DIAGNOSIS — C9000 Multiple myeloma not having achieved remission: Secondary | ICD-10-CM | POA: Insufficient documentation

## 2010-09-12 DIAGNOSIS — Z7901 Long term (current) use of anticoagulants: Secondary | ICD-10-CM | POA: Insufficient documentation

## 2010-09-12 DIAGNOSIS — K219 Gastro-esophageal reflux disease without esophagitis: Secondary | ICD-10-CM | POA: Insufficient documentation

## 2010-09-12 DIAGNOSIS — D61818 Other pancytopenia: Secondary | ICD-10-CM | POA: Insufficient documentation

## 2010-09-12 DIAGNOSIS — I10 Essential (primary) hypertension: Secondary | ICD-10-CM | POA: Insufficient documentation

## 2010-09-12 LAB — CBC
HCT: 37.6 % — ABNORMAL LOW (ref 39.0–52.0)
Hemoglobin: 12.3 g/dL — ABNORMAL LOW (ref 13.0–17.0)
MCH: 33 pg (ref 26.0–34.0)
MCV: 100.8 fL — ABNORMAL HIGH (ref 78.0–100.0)
Platelets: 85 10*3/uL — ABNORMAL LOW (ref 150–400)
RBC: 3.73 MIL/uL — ABNORMAL LOW (ref 4.22–5.81)
WBC: 2.5 10*3/uL — ABNORMAL LOW (ref 4.0–10.5)

## 2010-09-12 LAB — DIFFERENTIAL
Basophils Relative: 1 % (ref 0–1)
Eosinophils Relative: 5 % (ref 0–5)
Lymphs Abs: 0.9 10*3/uL (ref 0.7–4.0)
Monocytes Relative: 20 % — ABNORMAL HIGH (ref 3–12)
Neutro Abs: 1 10*3/uL — ABNORMAL LOW (ref 1.7–7.7)

## 2010-09-26 NOTE — Op Note (Signed)
  NAME:  Gene Hill, Gene Hill NO.:  [id number]  MEDICAL RECORD NO.:  [id number]  LOCATION:  MDC                          FACILITY:  [name]  PHYSICIAN:  Josph Macho, M.D.  DATE OF BIRTH:  Apr 13, 1940  DATE OF PROCEDURE:  09/12/2010 DATE OF DISCHARGE:                              OPERATIVE REPORT   PROCEDURE:  Left posterior iliac crest bone-marrow biopsy and aspirate.  DESCRIPTION OF PROCEDURE:  [name] was brought to the short stay unit.  He had an IV placed without any difficulties.  His Mallampati score was 1.  ASA Class was I.  We then placed him onto his right side.  The left posterior iliac crest region was prepped and draped in sterile fashion.  The appropriate time-out procedure was performed.  He received a total of 5 mg of Versed and 25 mg of Demerol for IV sedation.  10 mL of 2% lidocaine was infiltrated under the skin down to the periosteum.  A #11 scalpel was used to make an incision into the skin. Two bone-marrow aspirates were obtained without difficulty.  We then obtained an excellent bone-marrow biopsy core.  The patient tolerated the procedure well.  There were no complications.     Josph Macho, M.D.     PRE/MEDQ  D:  09/12/2010  T:  09/12/2010  Job:  [id number]  Electronically Signed by Arlan Organ  on 09/26/2010 07:24:31 AM

## 2010-09-29 ENCOUNTER — Other Ambulatory Visit: Payer: Self-pay | Admitting: Hematology & Oncology

## 2010-09-29 ENCOUNTER — Encounter (HOSPITAL_BASED_OUTPATIENT_CLINIC_OR_DEPARTMENT_OTHER): Payer: PRIVATE HEALTH INSURANCE | Admitting: Hematology & Oncology

## 2010-09-29 DIAGNOSIS — Z5112 Encounter for antineoplastic immunotherapy: Secondary | ICD-10-CM

## 2010-09-29 DIAGNOSIS — C9011 Plasma cell leukemia in remission: Secondary | ICD-10-CM

## 2010-09-29 DIAGNOSIS — C9000 Multiple myeloma not having achieved remission: Secondary | ICD-10-CM

## 2010-09-29 LAB — CBC WITH DIFFERENTIAL (CANCER CENTER ONLY)
BASO#: 0 10*3/uL (ref 0.0–0.2)
EOS%: 2.5 % (ref 0.0–7.0)
Eosinophils Absolute: 0.1 10*3/uL (ref 0.0–0.5)
HCT: 35.1 % — ABNORMAL LOW (ref 38.7–49.9)
LYMPH%: 35.6 % (ref 14.0–48.0)
MONO%: 11.3 % (ref 0.0–13.0)
NEUT#: 1.2 10*3/uL — ABNORMAL LOW (ref 1.5–6.5)
NEUT%: 49.3 % (ref 40.0–80.0)
Platelets: 126 10*3/uL — ABNORMAL LOW (ref 145–400)
RDW: 13.9 % (ref 11.1–15.7)
WBC: 2.4 10*3/uL — ABNORMAL LOW (ref 4.0–10.0)

## 2010-10-03 ENCOUNTER — Ambulatory Visit (HOSPITAL_BASED_OUTPATIENT_CLINIC_OR_DEPARTMENT_OTHER)
Admission: RE | Admit: 2010-10-03 | Discharge: 2010-10-03 | Disposition: A | Discharge status: Home / Self Care | Payer: Medicare Other | Source: Ambulatory Visit | Attending: Hematology & Oncology | Admitting: Hematology & Oncology

## 2010-10-03 DIAGNOSIS — C9011 Plasma cell leukemia in remission: Secondary | ICD-10-CM

## 2010-10-03 DIAGNOSIS — C9000 Multiple myeloma not having achieved remission: Secondary | ICD-10-CM | POA: Insufficient documentation

## 2010-10-03 LAB — COMPREHENSIVE METABOLIC PANEL
ALT: 27 U/L (ref 0–53)
AST: 29 U/L (ref 0–37)
Alkaline Phosphatase: 41 U/L (ref 39–117)
BUN: 14 mg/dL (ref 6–23)
Calcium: 8.9 mg/dL (ref 8.4–10.5)
Creatinine, Ser: 0.85 mg/dL (ref 0.50–1.35)
Total Bilirubin: 0.4 mg/dL (ref 0.3–1.2)

## 2010-10-03 LAB — BETA 2 MICROGLOBULIN, SERUM: Beta-2 Microglobulin: 1.63 mg/L (ref 1.01–1.73)

## 2010-10-03 LAB — SPEP & IFE WITH QIG
Alpha-1-Globulin: 4 % (ref 2.9–4.9)
Beta 2: 6.9 % — ABNORMAL HIGH (ref 3.2–6.5)
Gamma Globulin: 11.8 % (ref 11.1–18.8)
IgG (Immunoglobin G), Serum: 855 mg/dL (ref 650–1600)
IgM, Serum: 17 mg/dL — ABNORMAL LOW (ref 41–251)

## 2010-10-03 LAB — KAPPA/LAMBDA LIGHT CHAINS: Kappa:Lambda Ratio: 1.52 (ref 0.26–1.65)

## 2010-10-05 ENCOUNTER — Encounter (HOSPITAL_BASED_OUTPATIENT_CLINIC_OR_DEPARTMENT_OTHER): Payer: PRIVATE HEALTH INSURANCE | Admitting: Hematology & Oncology

## 2010-10-05 ENCOUNTER — Other Ambulatory Visit: Payer: Self-pay | Admitting: Hematology & Oncology

## 2010-10-05 DIAGNOSIS — Z5112 Encounter for antineoplastic immunotherapy: Secondary | ICD-10-CM

## 2010-10-05 DIAGNOSIS — Z5111 Encounter for antineoplastic chemotherapy: Secondary | ICD-10-CM

## 2010-10-05 DIAGNOSIS — C9000 Multiple myeloma not having achieved remission: Secondary | ICD-10-CM

## 2010-10-05 LAB — CBC WITH DIFFERENTIAL (CANCER CENTER ONLY)
BASO#: 0 10*3/uL (ref 0.0–0.2)
EOS%: 2.2 % (ref 0.0–7.0)
Eosinophils Absolute: 0.1 10*3/uL (ref 0.0–0.5)
HGB: 12.5 g/dL — ABNORMAL LOW (ref 13.0–17.1)
LYMPH%: 29.9 % (ref 14.0–48.0)
MCH: 34.6 pg — ABNORMAL HIGH (ref 28.0–33.4)
MCHC: 34.2 g/dL (ref 32.0–35.9)
MCV: 101 fL — ABNORMAL HIGH (ref 82–98)
MONO%: 15.3 % — ABNORMAL HIGH (ref 0.0–13.0)
Platelets: 116 10*3/uL — ABNORMAL LOW (ref 145–400)
RBC: 3.61 10*6/uL — ABNORMAL LOW (ref 4.20–5.70)

## 2010-10-12 ENCOUNTER — Other Ambulatory Visit: Payer: Self-pay | Admitting: Hematology & Oncology

## 2010-10-12 ENCOUNTER — Encounter (HOSPITAL_BASED_OUTPATIENT_CLINIC_OR_DEPARTMENT_OTHER): Payer: PRIVATE HEALTH INSURANCE | Admitting: Hematology & Oncology

## 2010-10-12 DIAGNOSIS — Z5111 Encounter for antineoplastic chemotherapy: Secondary | ICD-10-CM

## 2010-10-12 DIAGNOSIS — C9000 Multiple myeloma not having achieved remission: Secondary | ICD-10-CM

## 2010-10-12 DIAGNOSIS — Z5112 Encounter for antineoplastic immunotherapy: Secondary | ICD-10-CM

## 2010-10-12 LAB — CBC WITH DIFFERENTIAL (CANCER CENTER ONLY)
BASO#: 0 10*3/uL (ref 0.0–0.2)
EOS%: 2.6 % (ref 0.0–7.0)
HCT: 34.9 % — ABNORMAL LOW (ref 38.7–49.9)
HGB: 11.9 g/dL — ABNORMAL LOW (ref 13.0–17.1)
LYMPH%: 26.8 % (ref 14.0–48.0)
MCH: 34.4 pg — ABNORMAL HIGH (ref 28.0–33.4)
MCHC: 34.1 g/dL (ref 32.0–35.9)
MCV: 101 fL — ABNORMAL HIGH (ref 82–98)
MONO%: 16.3 % — ABNORMAL HIGH (ref 0.0–13.0)
NEUT%: 54 % (ref 40.0–80.0)

## 2010-11-09 ENCOUNTER — Other Ambulatory Visit: Payer: Self-pay | Admitting: Hematology & Oncology

## 2010-11-09 ENCOUNTER — Encounter (HOSPITAL_BASED_OUTPATIENT_CLINIC_OR_DEPARTMENT_OTHER): Payer: PRIVATE HEALTH INSURANCE | Admitting: Hematology & Oncology

## 2010-11-09 LAB — CBC WITH DIFFERENTIAL (CANCER CENTER ONLY)
BASO#: 0 10*3/uL (ref 0.0–0.2)
EOS%: 2.7 % (ref 0.0–7.0)
HGB: 13 g/dL (ref 13.0–17.1)
MCH: 35.2 pg — ABNORMAL HIGH (ref 28.0–33.4)
MCHC: 35.4 g/dL (ref 32.0–35.9)
MONO%: 12.3 % (ref 0.0–13.0)
NEUT#: 1.3 10*3/uL — ABNORMAL LOW (ref 1.5–6.5)
Platelets: 124 10*3/uL — ABNORMAL LOW (ref 145–400)
RDW: 14.3 % (ref 11.1–15.7)

## 2010-11-13 LAB — COMPREHENSIVE METABOLIC PANEL
ALT: 37 U/L (ref 0–53)
Albumin: 4.4 g/dL (ref 3.5–5.2)
CO2: 26 mEq/L (ref 19–32)
Glucose, Bld: 101 mg/dL — ABNORMAL HIGH (ref 70–99)
Potassium: 4.4 mEq/L (ref 3.5–5.3)
Sodium: 141 mEq/L (ref 135–145)
Total Protein: 6.9 g/dL (ref 6.0–8.3)

## 2010-11-13 LAB — KAPPA/LAMBDA LIGHT CHAINS
Kappa free light chain: 1.06 mg/dL (ref 0.33–1.94)
Lambda Free Lght Chn: 0.72 mg/dL (ref 0.57–2.63)

## 2010-11-13 LAB — PROTEIN ELECTROPHORESIS, SERUM
Alpha-2-Globulin: 10.8 % (ref 7.1–11.8)
Beta Globulin: 6.2 % (ref 4.7–7.2)
Gamma Globulin: 12 % (ref 11.1–18.8)
Total Protein, Serum Electrophoresis: 6.9 g/dL (ref 6.0–8.3)

## 2010-11-13 LAB — IGG, IGA, IGM
IgA: 280 mg/dL (ref 68–379)
IgG (Immunoglobin G), Serum: 837 mg/dL (ref 650–1600)

## 2010-11-13 LAB — LACTATE DEHYDROGENASE: LDH: 192 U/L (ref 94–250)

## 2010-11-16 ENCOUNTER — Other Ambulatory Visit: Payer: Self-pay | Admitting: Family

## 2010-11-16 ENCOUNTER — Encounter (HOSPITAL_BASED_OUTPATIENT_CLINIC_OR_DEPARTMENT_OTHER): Payer: Medicare Other | Admitting: Hematology & Oncology

## 2010-11-16 DIAGNOSIS — C9000 Multiple myeloma not having achieved remission: Secondary | ICD-10-CM

## 2010-11-16 DIAGNOSIS — Z5112 Encounter for antineoplastic immunotherapy: Secondary | ICD-10-CM

## 2010-11-16 DIAGNOSIS — Z5111 Encounter for antineoplastic chemotherapy: Secondary | ICD-10-CM

## 2010-11-16 LAB — CBC WITH DIFFERENTIAL (CANCER CENTER ONLY)
BASO#: 0 10*3/uL (ref 0.0–0.2)
Eosinophils Absolute: 0.1 10*3/uL (ref 0.0–0.5)
HCT: 37.1 % — ABNORMAL LOW (ref 38.7–49.9)
HGB: 12.5 g/dL — ABNORMAL LOW (ref 13.0–17.1)
LYMPH%: 31 % (ref 14.0–48.0)
MCV: 101 fL — ABNORMAL HIGH (ref 82–98)
MONO#: 0.3 10*3/uL (ref 0.1–0.9)
NEUT%: 52.2 % (ref 40.0–80.0)
RBC: 3.66 10*6/uL — ABNORMAL LOW (ref 4.20–5.70)
WBC: 2.3 10*3/uL — ABNORMAL LOW (ref 4.0–10.0)

## 2010-11-23 ENCOUNTER — Encounter (HOSPITAL_BASED_OUTPATIENT_CLINIC_OR_DEPARTMENT_OTHER): Payer: Medicare Other | Admitting: Hematology & Oncology

## 2010-11-23 DIAGNOSIS — Z5112 Encounter for antineoplastic immunotherapy: Secondary | ICD-10-CM

## 2010-11-23 DIAGNOSIS — C9000 Multiple myeloma not having achieved remission: Secondary | ICD-10-CM

## 2010-12-12 ENCOUNTER — Other Ambulatory Visit: Payer: Self-pay | Admitting: *Deleted

## 2010-12-18 ENCOUNTER — Ambulatory Visit: Payer: PRIVATE HEALTH INSURANCE | Admitting: Internal Medicine

## 2010-12-21 ENCOUNTER — Ambulatory Visit (HOSPITAL_BASED_OUTPATIENT_CLINIC_OR_DEPARTMENT_OTHER): Payer: Medicare Other | Admitting: Hematology & Oncology

## 2010-12-21 ENCOUNTER — Other Ambulatory Visit (HOSPITAL_BASED_OUTPATIENT_CLINIC_OR_DEPARTMENT_OTHER): Payer: Medicare Other | Admitting: Lab

## 2010-12-21 ENCOUNTER — Ambulatory Visit (HOSPITAL_BASED_OUTPATIENT_CLINIC_OR_DEPARTMENT_OTHER): Payer: Medicare Other

## 2010-12-21 ENCOUNTER — Other Ambulatory Visit: Payer: Self-pay | Admitting: Hematology & Oncology

## 2010-12-21 VITALS — BP 149/84 | HR 77 | Temp 97.8°F | Ht 67.0 in | Wt 223.0 lb

## 2010-12-21 DIAGNOSIS — C9000 Multiple myeloma not having achieved remission: Secondary | ICD-10-CM

## 2010-12-21 DIAGNOSIS — Z5111 Encounter for antineoplastic chemotherapy: Secondary | ICD-10-CM

## 2010-12-21 DIAGNOSIS — Z5112 Encounter for antineoplastic immunotherapy: Secondary | ICD-10-CM

## 2010-12-21 LAB — CBC WITH DIFFERENTIAL (CANCER CENTER ONLY)
BASO%: 0.7 % (ref 0.0–2.0)
HCT: 38.4 % — ABNORMAL LOW (ref 38.7–49.9)
LYMPH%: 24.9 % (ref 14.0–48.0)
MCH: 34.4 pg — ABNORMAL HIGH (ref 28.0–33.4)
MCHC: 34.1 g/dL (ref 32.0–35.9)
MCV: 101 fL — ABNORMAL HIGH (ref 82–98)
MONO#: 0.4 10*3/uL (ref 0.1–0.9)
NEUT%: 58.5 % (ref 40.0–80.0)
RDW: 14.1 % (ref 11.1–15.7)
WBC: 3 10*3/uL — ABNORMAL LOW (ref 4.0–10.0)

## 2010-12-21 MED ORDER — BORTEZOMIB CHEMO SQ INJECTION 3.5 MG (2.5MG/ML)
1.3000 mg/m2 | Freq: Once | INTRAMUSCULAR | Status: AC
  Administered 2010-12-21: 2.75 mg via SUBCUTANEOUS
  Filled 2010-12-21: qty 2.75

## 2010-12-21 MED ORDER — ZOLEDRONIC ACID 4 MG/100ML IV SOLN
4.0000 mg | Freq: Once | INTRAVENOUS | Status: AC
  Administered 2010-12-21: 4 mg via INTRAVENOUS
  Filled 2010-12-21: qty 100

## 2010-12-21 MED ORDER — SODIUM CHLORIDE 0.9 % IV SOLN
Freq: Once | INTRAVENOUS | Status: AC
  Administered 2010-12-21: 13:00:00 via INTRAVENOUS

## 2010-12-21 MED ORDER — ONDANSETRON HCL 8 MG PO TABS
8.0000 mg | ORAL_TABLET | Freq: Once | ORAL | Status: AC
  Administered 2010-12-21: 8 mg via ORAL

## 2010-12-21 NOTE — Progress Notes (Signed)
This office note has been dictated.

## 2010-12-22 ENCOUNTER — Telehealth: Payer: Self-pay | Admitting: *Deleted

## 2010-12-22 ENCOUNTER — Other Ambulatory Visit: Payer: Self-pay | Admitting: Certified Registered Nurse Anesthetist

## 2010-12-22 LAB — BETA 2 MICROGLOBULIN, SERUM: Beta-2 Microglobulin: 1.66 mg/L (ref 1.01–1.73)

## 2010-12-22 LAB — COMPREHENSIVE METABOLIC PANEL
ALT: 38 U/L (ref 0–53)
ALT: 38 U/L (ref 0–53)
AST: 33 U/L (ref 0–37)
Albumin: 4.4 g/dL (ref 3.5–5.2)
Alkaline Phosphatase: 45 U/L (ref 39–117)
Alkaline Phosphatase: 45 U/L (ref 39–117)
BUN: 14 mg/dL (ref 6–23)
CO2: 28 mEq/L (ref 19–32)
CO2: 28 mEq/L (ref 19–32)
Calcium: 8.9 mg/dL (ref 8.4–10.5)
Chloride: 104 mEq/L (ref 96–112)
Creatinine, Ser: 0.93 mg/dL (ref 0.50–1.35)
Glucose, Bld: 101 mg/dL — ABNORMAL HIGH (ref 70–99)
Potassium: 4 mEq/L (ref 3.5–5.3)
Potassium: 4 mEq/L (ref 3.5–5.3)
Sodium: 140 mEq/L (ref 135–145)
Sodium: 140 mEq/L (ref 135–145)
Total Bilirubin: 0.4 mg/dL (ref 0.3–1.2)
Total Bilirubin: 0.4 mg/dL (ref 0.3–1.2)
Total Bilirubin: 0.4 mg/dL (ref 0.3–1.2)
Total Protein: 6.7 g/dL (ref 6.0–8.3)
Total Protein: 6.7 g/dL (ref 6.0–8.3)

## 2010-12-22 LAB — KAPPA/LAMBDA LIGHT CHAINS
Kappa free light chain: 1.44 mg/dL (ref 0.33–1.94)
Kappa:Lambda Ratio: 2.53 — ABNORMAL HIGH (ref 0.26–1.65)

## 2010-12-22 NOTE — Progress Notes (Deleted)
CC:   Willow Ora, MD Verita Schneiders, MD  DIAGNOSIS:  IgA kappa myeloma.  CURRENT THERAPY: 1. Revlimid 10 mg p.o. daily (21 days on/7 days off). 2. Velcade every week subcutaneous dosing (3 weeks on/3 weeks off). 3. Zometa 4 mg IV every month.  INTERIM HISTORY:  Mr. Capek comes in for his follow-up.  He is doing okay.  He had a good Thanksgiving.  His performance status is improved a little bit.  He is not as fatigued.  When we last checked his myeloma panel in October, his IgA level was 280 mg/dL.  There is no monoclonal spike in his serum, his serum kappa light chain of 1.06 mg/dL.  He has had no cough.  He has had no diarrhea.  He has had no leg swelling.  He has had no rashes.  There is no headache.  PHYSICAL EXAMINATION:  General Appearance:  This is a well-developed, well-nourished white gentleman in no obvious distress.  Vital Signs: Show a temperature of 97.8, pulse 77, respiratory rate 18, blood pressure 149/84.  Weight is 223.  Head and Neck Exam:  Shows a normocephalic, atraumatic skull.  There are no ocular or oral lesions. There are no palpable cervical or supraclavicular lymph nodes.  Lungs: Clear to percussion and auscultation bilaterally.  Cardiac Exam: Regular rate and rhythm with a normal S1 and S2.  There are no murmurs, rubs or bruits.  Abdominal Exam:  Soft with good bowel sounds.  There is no palpable abdominal mass.  There is no fluid wave.  There is no palpable hepatosplenomegaly.  Back Exam:  No tenderness over the spine, ribs or hips.  Extremities:  Show no clubbing, cyanosis or edema.  Skin Exam:  No rashes, ecchymosis, or petechia.  LABORATORY STUDIES:  White cell count is 3, hemoglobin 13.1, hematocrit 38.4, platelet count 122.  Sodium 140, potassium 4, BUN 14, creatinine 0.93.  Calcium 8.9 with an albumin of 4.4.  IMPRESSION:  Mr. Mccauley is a 70 year old gentleman with IgA kappa myeloma.  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  He has  done very nicely.  He is on maintenance therapy with Revlimid and Velcade. We are being aggressive with him as he has the 17p chromosome deletion.  We will continue him on his present regimen.  Again, he is doing well with this.  He has not shown any stress from the treatment.  I will plan to get him back to see me the first week in January 2013. By then, he will be ready to start his next 3-week cycle of Velcade.  He will continue on his low-dose Coumadin.    ______________________________ Josph Macho, M.D. PRE/MEDQ  D:  12/21/2010  T:  12/22/2010  Job:  [id number]

## 2010-12-22 NOTE — Telephone Encounter (Signed)
Pt aware of 12-6 and 12-13 appointments

## 2010-12-25 LAB — SPEP & IFE WITH QIG
Albumin ELP: 61 % (ref 55.8–66.1)
Alpha-1-Globulin: 3.8 % (ref 2.9–4.9)
Beta 2: 6 % (ref 3.2–6.5)
Beta Globulin: 6 % (ref 4.7–7.2)
Gamma Globulin: 12 % (ref 11.1–18.8)
IgA: 225 mg/dL (ref 68–379)
IgM, Serum: 19 mg/dL — ABNORMAL LOW (ref 41–251)

## 2010-12-25 LAB — COMPREHENSIVE METABOLIC PANEL
AST: 33 U/L (ref 0–37)
Alkaline Phosphatase: 45 U/L (ref 39–117)
BUN: 14 mg/dL (ref 6–23)
Calcium: 8.9 mg/dL (ref 8.4–10.5)
Creatinine, Ser: 0.93 mg/dL (ref 0.50–1.35)
Glucose, Bld: 101 mg/dL — ABNORMAL HIGH (ref 70–99)

## 2010-12-25 LAB — KAPPA/LAMBDA LIGHT CHAINS
Kappa:Lambda Ratio: 2.53 — ABNORMAL HIGH (ref 0.26–1.65)
Lambda Free Lght Chn: 0.57 mg/dL (ref 0.57–2.63)

## 2010-12-25 LAB — BETA 2 MICROGLOBULIN, SERUM: Beta-2 Microglobulin: 1.66 mg/L (ref 1.01–1.73)

## 2010-12-28 ENCOUNTER — Ambulatory Visit (HOSPITAL_BASED_OUTPATIENT_CLINIC_OR_DEPARTMENT_OTHER): Payer: Medicare Other

## 2010-12-28 ENCOUNTER — Other Ambulatory Visit: Payer: Medicare Other | Admitting: Lab

## 2010-12-28 VITALS — BP 157/78 | HR 73 | Temp 97.3°F | Ht 67.0 in | Wt 222.8 lb

## 2010-12-28 DIAGNOSIS — Z5112 Encounter for antineoplastic immunotherapy: Secondary | ICD-10-CM

## 2010-12-28 DIAGNOSIS — C9000 Multiple myeloma not having achieved remission: Secondary | ICD-10-CM

## 2010-12-28 MED ORDER — BORTEZOMIB CHEMO SQ INJECTION 3.5 MG (2.5MG/ML)
1.3000 mg/m2 | Freq: Once | INTRAMUSCULAR | Status: AC
  Administered 2010-12-28: 2.75 mg via SUBCUTANEOUS
  Filled 2010-12-28: qty 2.75

## 2010-12-28 MED ORDER — ONDANSETRON HCL 8 MG PO TABS
8.0000 mg | ORAL_TABLET | Freq: Once | ORAL | Status: AC
  Administered 2010-12-28: 8 mg via ORAL

## 2011-01-04 ENCOUNTER — Ambulatory Visit (HOSPITAL_BASED_OUTPATIENT_CLINIC_OR_DEPARTMENT_OTHER): Payer: Medicare Other

## 2011-01-04 ENCOUNTER — Other Ambulatory Visit: Payer: Medicare Other | Admitting: Lab

## 2011-01-04 ENCOUNTER — Encounter: Payer: Self-pay | Admitting: *Deleted

## 2011-01-04 ENCOUNTER — Other Ambulatory Visit: Payer: Self-pay | Admitting: *Deleted

## 2011-01-04 VITALS — BP 150/89 | HR 65 | Temp 97.2°F

## 2011-01-04 DIAGNOSIS — C9000 Multiple myeloma not having achieved remission: Secondary | ICD-10-CM

## 2011-01-04 DIAGNOSIS — Z5112 Encounter for antineoplastic immunotherapy: Secondary | ICD-10-CM

## 2011-01-04 MED ORDER — ONDANSETRON HCL 8 MG PO TABS
8.0000 mg | ORAL_TABLET | Freq: Once | ORAL | Status: AC
  Administered 2011-01-04: 8 mg via ORAL

## 2011-01-04 MED ORDER — BORTEZOMIB CHEMO SQ INJECTION 3.5 MG (2.5MG/ML)
1.3000 mg/m2 | Freq: Once | INTRAMUSCULAR | Status: AC
  Administered 2011-01-04: 2.75 mg via SUBCUTANEOUS
  Filled 2011-01-04: qty 2.75

## 2011-01-04 NOTE — Telephone Encounter (Signed)
Received refill request from [facility] for Revlimid 10 mg daily for 21 days on then 7 days off.

## 2011-01-05 ENCOUNTER — Other Ambulatory Visit: Payer: Self-pay | Admitting: *Deleted

## 2011-01-05 MED ORDER — LENALIDOMIDE 10 MG PO CAPS: 10.0000 mg | ORAL_CAPSULE | Freq: Every day | ORAL | Status: DC

## 2011-01-27 ENCOUNTER — Ambulatory Visit (INDEPENDENT_AMBULATORY_CARE_PROVIDER_SITE_OTHER): Payer: Medicare Other

## 2011-01-27 DIAGNOSIS — M79609 Pain in unspecified limb: Secondary | ICD-10-CM

## 2011-01-27 DIAGNOSIS — C9000 Multiple myeloma not having achieved remission: Secondary | ICD-10-CM

## 2011-01-29 ENCOUNTER — Telehealth: Payer: Self-pay | Admitting: *Deleted

## 2011-01-29 ENCOUNTER — Other Ambulatory Visit: Payer: Self-pay | Admitting: *Deleted

## 2011-01-29 ENCOUNTER — Other Ambulatory Visit: Payer: Self-pay | Admitting: Hematology & Oncology

## 2011-01-29 ENCOUNTER — Ambulatory Visit (HOSPITAL_BASED_OUTPATIENT_CLINIC_OR_DEPARTMENT_OTHER)
Admission: RE | Admit: 2011-01-29 | Discharge: 2011-01-29 | Disposition: A | Discharge status: Home / Self Care | Payer: Medicare Other | Source: Ambulatory Visit | Attending: Hematology & Oncology | Admitting: Hematology & Oncology

## 2011-01-29 DIAGNOSIS — M79609 Pain in unspecified limb: Secondary | ICD-10-CM

## 2011-01-29 DIAGNOSIS — M7989 Other specified soft tissue disorders: Secondary | ICD-10-CM | POA: Insufficient documentation

## 2011-01-29 DIAGNOSIS — C9000 Multiple myeloma not having achieved remission: Secondary | ICD-10-CM

## 2011-01-29 NOTE — Telephone Encounter (Signed)
Pt aware of 1-7 doppler appointment

## 2011-01-30 ENCOUNTER — Ambulatory Visit (INDEPENDENT_AMBULATORY_CARE_PROVIDER_SITE_OTHER): Payer: Medicare Other | Admitting: Internal Medicine

## 2011-01-30 ENCOUNTER — Telehealth: Payer: Self-pay | Admitting: *Deleted

## 2011-01-30 VITALS — BP 130/70 | HR 77 | Temp 98.4°F | Wt 222.0 lb

## 2011-01-30 DIAGNOSIS — M1310 Monoarthritis, not elsewhere classified, unspecified site: Secondary | ICD-10-CM

## 2011-01-30 NOTE — Progress Notes (Signed)
  Subjective:    Patient ID: Gene Hill, male    DOB: January 31, 1940, 71 y.o.   MRN: [id number]  HPI Acute visit Symptoms started  6 days ago with a sudden onset of pain and swelling of the left ankle, external aspect. He went to the urgent care 2 days ago, he was diagnosed with gout, prescribed colcrys  and hydrocodone. They called him yesterday and told him that his uric acid level was normal. They also did an x-ray and   was reportedly negative. Ultrasound was negative for DVT Colchicine hydrocodone have not helped   Past Medical History: Multiple Myeloma---dx 03-2009 Hyperlipidemia Hypertension DJD, back pain CV: Coronary artery disease - cath 1998 - V dz - for medical management Carotid u/s 0-49% B 01/2004----------------u/s neg. 03-2009 arterial doppler <50% R vessel dz 03/2005 cardiolite neg 03/2005 GI: EGD 06-2008: Barrett's, Ulcer, duodenitis  Barretts Esophagus GERD  Past Surgical History: Rotator cuff repair  knee surgery R 1990s Spinal fusion (Neck) Lumbar fusion BM Bx 3-11  Family History: Father: prostate ca? Vkidney ca? colon ca-- no Mother:leukemia Siblings: bro - PE GF - stroke    Social History: Married 4 children Patient has never smoked.  Alcohol Use - yes  less than 1 per day Illicit Drug Use - no Occupation: Retired   Review of Systems Denies injury although he had insect bite at the area of the Achilles tendon 2 weeks ago. No fever chills No previous history of gout.     Objective:   Physical Exam  Constitutional: He appears well-developed and well-nourished.       Afebrile, nontoxic  Musculoskeletal:       Right foot and ankle normal. Left ankle definitely swollen, hot and very tender at the external malleolus, ? Of fluctuance. The internal malleolus is essentially normal. Some swelling extending to the dorsum of the foot. Toes with good capillary refill.       Assessment & Plan:    Monoarthritis: Patient presents with  monoarthritis, recent x-ray normal, recent ultrasound negative for DVT. Despite a normal uric acid this still could be gout but I am also concerned about septic arthritis. The  left ankle is swollen enough that is possible that an aspiration could be done. We contacted ortho, they will see him today at  3 pm Gene Hill) at he [place] office

## 2011-01-30 NOTE — Telephone Encounter (Signed)
Pt aware of 1-14 appointment and will get schedule for the rest

## 2011-01-31 ENCOUNTER — Other Ambulatory Visit: Payer: Self-pay | Admitting: *Deleted

## 2011-01-31 ENCOUNTER — Encounter: Payer: Self-pay | Admitting: Internal Medicine

## 2011-01-31 MED ORDER — LENALIDOMIDE 10 MG PO CAPS: 10.0000 mg | ORAL_CAPSULE | Freq: Every day | ORAL | Status: DC

## 2011-01-31 NOTE — Telephone Encounter (Signed)
Received refill request from [facility]. Will start next cycle on 02/05/11. Provided updated insurance information with rx.

## 2011-02-01 ENCOUNTER — Other Ambulatory Visit (HOSPITAL_BASED_OUTPATIENT_CLINIC_OR_DEPARTMENT_OTHER): Payer: Medicare Other | Admitting: Lab

## 2011-02-01 ENCOUNTER — Ambulatory Visit (HOSPITAL_BASED_OUTPATIENT_CLINIC_OR_DEPARTMENT_OTHER): Payer: Medicare Other

## 2011-02-01 ENCOUNTER — Telehealth: Payer: Self-pay | Admitting: *Deleted

## 2011-02-01 ENCOUNTER — Other Ambulatory Visit: Payer: Self-pay | Admitting: Hematology & Oncology

## 2011-02-01 VITALS — BP 129/81 | HR 66 | Temp 97.1°F

## 2011-02-01 DIAGNOSIS — C9000 Multiple myeloma not having achieved remission: Secondary | ICD-10-CM

## 2011-02-01 DIAGNOSIS — Z5112 Encounter for antineoplastic immunotherapy: Secondary | ICD-10-CM

## 2011-02-01 LAB — COMPREHENSIVE METABOLIC PANEL
AST: 22 U/L (ref 0–37)
Albumin: 4 g/dL (ref 3.5–5.2)
Albumin: 4 g/dL (ref 3.5–5.2)
Alkaline Phosphatase: 44 U/L (ref 39–117)
Alkaline Phosphatase: 44 U/L (ref 39–117)
BUN: 18 mg/dL (ref 6–23)
BUN: 18 mg/dL (ref 6–23)
BUN: 18 mg/dL (ref 6–23)
Calcium: 8.4 mg/dL (ref 8.4–10.5)
Calcium: 8.4 mg/dL (ref 8.4–10.5)
Chloride: 101 mEq/L (ref 96–112)
Creatinine, Ser: 0.88 mg/dL (ref 0.50–1.35)
Creatinine, Ser: 0.88 mg/dL (ref 0.50–1.35)
Glucose, Bld: 93 mg/dL (ref 70–99)
Glucose, Bld: 93 mg/dL (ref 70–99)
Potassium: 3.8 mEq/L (ref 3.5–5.3)
Potassium: 3.8 mEq/L (ref 3.5–5.3)
Total Bilirubin: 0.3 mg/dL (ref 0.3–1.2)
Total Bilirubin: 0.3 mg/dL (ref 0.3–1.2)

## 2011-02-01 LAB — IGA
IgA: 226 mg/dL (ref 68–379)
IgA: 226 mg/dL (ref 68–379)

## 2011-02-01 LAB — IGG: IgG (Immunoglobin G), Serum: 1020 mg/dL (ref 650–1600)

## 2011-02-01 LAB — CBC WITH DIFFERENTIAL (CANCER CENTER ONLY)
Eosinophils Absolute: 0.1 10*3/uL (ref 0.0–0.5)
HCT: 35.7 % — ABNORMAL LOW (ref 38.7–49.9)
LYMPH%: 28.8 % (ref 14.0–48.0)
MCH: 33.9 pg — ABNORMAL HIGH (ref 28.0–33.4)
MCV: 102 fL — ABNORMAL HIGH (ref 82–98)
MONO#: 0.9 10*3/uL (ref 0.1–0.9)
MONO%: 22.3 % — ABNORMAL HIGH (ref 0.0–13.0)
NEUT%: 46.8 % (ref 40.0–80.0)
RDW: 14.4 % (ref 11.1–15.7)
WBC: 3.9 10*3/uL — ABNORMAL LOW (ref 4.0–10.0)

## 2011-02-01 LAB — IGM
IgM, Serum: 18 mg/dL — ABNORMAL LOW (ref 41–251)
IgM, Serum: 18 mg/dL — ABNORMAL LOW (ref 41–251)

## 2011-02-01 LAB — LACTATE DEHYDROGENASE: LDH: 127 U/L (ref 94–250)

## 2011-02-01 LAB — PROTEIN ELECTROPHORESIS, SERUM
Albumin ELP: 54.6 % — ABNORMAL LOW (ref 55.8–66.1)
Alpha-1-Globulin: 5.4 % — ABNORMAL HIGH (ref 2.9–4.9)
Gamma Globulin: 11.9 % (ref 11.1–18.8)
Total Protein, Serum Electrophoresis: 6.3 g/dL (ref 6.0–8.3)

## 2011-02-01 MED ORDER — SODIUM CHLORIDE 0.9 % IV SOLN
Freq: Once | INTRAVENOUS | Status: AC
  Administered 2011-02-01: 15:00:00 via INTRAVENOUS

## 2011-02-01 MED ORDER — HEPARIN SOD (PORK) LOCK FLUSH 100 UNIT/ML IV SOLN
500.0000 [IU] | Freq: Once | INTRAVENOUS | Status: DC | PRN
  Filled 2011-02-01: qty 5

## 2011-02-01 MED ORDER — ONDANSETRON HCL 8 MG PO TABS
8.0000 mg | ORAL_TABLET | Freq: Once | ORAL | Status: AC
  Administered 2011-02-01: 8 mg via ORAL

## 2011-02-01 MED ORDER — BORTEZOMIB CHEMO SQ INJECTION 3.5 MG (2.5MG/ML)
1.3000 mg/m2 | Freq: Once | INTRAMUSCULAR | Status: AC
  Administered 2011-02-01: 2.75 mg via SUBCUTANEOUS
  Filled 2011-02-01: qty 1.1

## 2011-02-01 MED ORDER — ZOLEDRONIC ACID 4 MG/100ML IV SOLN
4.0000 mg | Freq: Once | INTRAVENOUS | Status: AC
  Administered 2011-02-01: 4 mg via INTRAVENOUS
  Filled 2011-02-01: qty 100

## 2011-02-01 NOTE — Telephone Encounter (Signed)
Left message to call office

## 2011-02-01 NOTE — Progress Notes (Signed)
Addended by: Lacie Draft on: 02/01/2011 04:51 PM   Modules accepted: Orders

## 2011-02-01 NOTE — Telephone Encounter (Signed)
Message copied by Verdene Rio on Thu Feb 01, 2011  5:06 PM ------      Message from: Willow Ora E      Created: Wed Jan 31, 2011  8:50 AM       Please check on the patient, was sent to orthopedic surgery. Let me know: better? On meds?

## 2011-02-02 NOTE — Telephone Encounter (Signed)
Thank you, I appreciate the follow-up

## 2011-02-02 NOTE — Telephone Encounter (Signed)
Pt wife state that Pt was given cortisone shot which helped the pain. Pt wife indicated that the fluid drain did test positive for gout. Pt foot is doing much better and most of the swelling and pain has resolved. Pt wife would like to let Dr Drue Novel know that Pt is not interesting in daily med regimen due to already having to do other meds and treatment. Pt is ok with doing a prednisone treatment to help acute issue now but note he does not really need anything now because he is better.

## 2011-02-05 LAB — PROTEIN ELECTROPHORESIS, SERUM
Beta 2: 6.5 % (ref 3.2–6.5)
Beta Globulin: 5.8 % (ref 4.7–7.2)
Total Protein, Serum Electrophoresis: 6.3 g/dL (ref 6.0–8.3)

## 2011-02-05 LAB — COMPREHENSIVE METABOLIC PANEL
Albumin: 4 g/dL (ref 3.5–5.2)
Alkaline Phosphatase: 44 U/L (ref 39–117)
Glucose, Bld: 93 mg/dL (ref 70–99)
Potassium: 3.8 mEq/L (ref 3.5–5.3)
Sodium: 136 mEq/L (ref 135–145)
Total Protein: 6.3 g/dL (ref 6.0–8.3)

## 2011-02-05 LAB — KAPPA/LAMBDA LIGHT CHAINS: Kappa:Lambda Ratio: 1.15 (ref 0.26–1.65)

## 2011-02-08 ENCOUNTER — Ambulatory Visit (HOSPITAL_BASED_OUTPATIENT_CLINIC_OR_DEPARTMENT_OTHER): Payer: Medicare Other

## 2011-02-08 ENCOUNTER — Other Ambulatory Visit (HOSPITAL_BASED_OUTPATIENT_CLINIC_OR_DEPARTMENT_OTHER): Payer: Medicare Other | Admitting: Lab

## 2011-02-08 VITALS — BP 128/78 | HR 74 | Temp 97.1°F

## 2011-02-08 DIAGNOSIS — C9000 Multiple myeloma not having achieved remission: Secondary | ICD-10-CM

## 2011-02-08 DIAGNOSIS — Z5112 Encounter for antineoplastic immunotherapy: Secondary | ICD-10-CM

## 2011-02-08 LAB — CBC WITH DIFFERENTIAL (CANCER CENTER ONLY)
BASO#: 0 10*3/uL (ref 0.0–0.2)
HCT: 36.8 % — ABNORMAL LOW (ref 38.7–49.9)
HGB: 12.3 g/dL — ABNORMAL LOW (ref 13.0–17.1)
LYMPH#: 1 10*3/uL (ref 0.9–3.3)
MONO#: 0.7 10*3/uL (ref 0.1–0.9)
NEUT%: 45.2 % (ref 40.0–80.0)
WBC: 3.2 10*3/uL — ABNORMAL LOW (ref 4.0–10.0)

## 2011-02-08 MED ORDER — BORTEZOMIB CHEMO SQ INJECTION 3.5 MG (2.5MG/ML)
1.3000 mg/m2 | Freq: Once | INTRAMUSCULAR | Status: AC
  Administered 2011-02-08: 2.75 mg via SUBCUTANEOUS
  Filled 2011-02-08: qty 2.75

## 2011-02-08 MED ORDER — ONDANSETRON HCL 8 MG PO TABS
8.0000 mg | ORAL_TABLET | Freq: Once | ORAL | Status: AC
  Administered 2011-02-08: 8 mg via ORAL

## 2011-02-15 ENCOUNTER — Ambulatory Visit (HOSPITAL_BASED_OUTPATIENT_CLINIC_OR_DEPARTMENT_OTHER): Payer: Medicare Other

## 2011-02-15 ENCOUNTER — Other Ambulatory Visit (HOSPITAL_BASED_OUTPATIENT_CLINIC_OR_DEPARTMENT_OTHER): Payer: Medicare Other | Admitting: Lab

## 2011-02-15 ENCOUNTER — Ambulatory Visit (HOSPITAL_BASED_OUTPATIENT_CLINIC_OR_DEPARTMENT_OTHER): Payer: Medicare Other | Admitting: Hematology & Oncology

## 2011-02-15 VITALS — BP 137/84 | HR 69 | Temp 97.3°F | Ht 67.0 in | Wt 222.0 lb

## 2011-02-15 DIAGNOSIS — C9000 Multiple myeloma not having achieved remission: Secondary | ICD-10-CM

## 2011-02-15 DIAGNOSIS — Z5112 Encounter for antineoplastic immunotherapy: Secondary | ICD-10-CM

## 2011-02-15 LAB — CBC WITH DIFFERENTIAL (CANCER CENTER ONLY)
BASO#: 0 10*3/uL (ref 0.0–0.2)
Eosinophils Absolute: 0.1 10*3/uL (ref 0.0–0.5)
HGB: 12.2 g/dL — ABNORMAL LOW (ref 13.0–17.1)
LYMPH#: 1 10*3/uL (ref 0.9–3.3)
NEUT#: 2.4 10*3/uL (ref 1.5–6.5)
RBC: 3.6 10*6/uL — ABNORMAL LOW (ref 4.20–5.70)
WBC: 3.9 10*3/uL — ABNORMAL LOW (ref 4.0–10.0)

## 2011-02-15 MED ORDER — ONDANSETRON HCL 8 MG PO TABS
8.0000 mg | ORAL_TABLET | Freq: Once | ORAL | Status: AC
  Administered 2011-02-15: 8 mg via ORAL

## 2011-02-15 MED ORDER — BORTEZOMIB CHEMO SQ INJECTION 3.5 MG (2.5MG/ML)
1.3000 mg/m2 | Freq: Once | INTRAMUSCULAR | Status: AC
  Administered 2011-02-15: 2.75 mg via SUBCUTANEOUS
  Filled 2011-02-15: qty 2.75

## 2011-02-15 MED ORDER — FAMCICLOVIR 500 MG PO TABS: 500.0000 mg | ORAL_TABLET | Freq: Every day | ORAL | Status: DC

## 2011-02-15 NOTE — Progress Notes (Signed)
CC:   Willow Ora, MD Verita Schneiders, MD  DIAGNOSIS:  IgA kappa myeloma.  CURRENT THERAPY: 1. Revlimid 10 mg p.o. daily (21 days on/7 days off). 2. Velcade (3 weeks on/3 weeks off). 3. Zometa 4 mg IV q.6 weeks.  INTERVAL HISTORY:  Mr. Mceachin comes in for his followup.  He is looking better.  He was having problems with gout recently.  This seems to have resolved.  His monoclonal studies have all looked quite good.  His last myeloma panel done in early January did not show any protein with his monoclonal spike.  His IgA level was 226 mg/dL.  His free kappa light chain of 1.46 mg/dL. He has had a good appetite.  He has had no nausea or vomiting.  He has had occasional bouts of diarrhea.  He does have some fatigue.  He has been doing stuff around the house which he likes to do.  There has been no bleeding.  He is on low-dose Coumadin for DVT prophylaxis.  He has had no headache.  No dysphasia or odynophagia.  PHYSICAL EXAM:  General:  This is a well-developed, well-nourished, white gentleman, no obvious distress.  Vital signs:  97.3, pulse 68, respiratory 16, blood pressure 137/84.  Weight is 222.  Head and neck exam:  Shows a normocephalic, atraumatic skull.  There are no ocular or oral lesions.  There are no palpable cervical or supraclavicular lymph nodes.  Lungs:  Clear bilaterally.  Cardiac:  Regular rate and rhythm with normal S1, S2.  There are no murmurs, rubs or bruits.  Abdomen: Soft with good bowel sounds.  There is no palpable abdominal mass. There is no fluid wave.  There is no palpable hepatosplenomegaly. Extremities:  Show no clubbing, cyanosis or edema.  Skin:  No rash, ecchymosis or petechia.  Neurological exam:  No focal neurological deficits.  LABORATORY STUDIES:  White cell count 3.9, hemoglobin 12.2, hematocrit 36.6, platelet count 117,000.  IMPRESSION:  Mr. Iseman is a 71 year old gentleman with IgA kappa myeloma.  He has really done quite well.  He did undergo  a stem cell [facility] back in November 2011.  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  We will continue him on this program.  He has done very well with it. He has really had very few, if any complications.  We will plan to get him back on February 14 for his next 3 week cycle of Velcade.    ______________________________ Josph Macho, M.D. PRE/MEDQ  D:  02/15/2011  T:  02/15/2011  Job:  [id number]  ADDEDNUM:  (-) M-Spike.  IgA is 268 mg/dL. KAPPA is 1.41 mg/dL.

## 2011-02-15 NOTE — Progress Notes (Signed)
Addended by: Arlan Organ R on: 02/15/2011 02:51 PM   Modules accepted: Orders

## 2011-02-15 NOTE — Progress Notes (Signed)
This office note has been dictated.

## 2011-02-19 ENCOUNTER — Other Ambulatory Visit: Payer: Self-pay | Admitting: *Deleted

## 2011-02-19 DIAGNOSIS — C9000 Multiple myeloma not having achieved remission: Secondary | ICD-10-CM

## 2011-02-19 MED ORDER — FAMCICLOVIR 500 MG PO TABS: 500.0000 mg | ORAL_TABLET | Freq: Every day | ORAL | Status: DC

## 2011-02-19 NOTE — Telephone Encounter (Signed)
Pt's wife called. Rx needs to go to [facility] instead of [facility] due to insurance change after the first of the year. Re-issued the rx.

## 2011-02-27 ENCOUNTER — Other Ambulatory Visit: Payer: Self-pay | Admitting: *Deleted

## 2011-02-27 MED ORDER — LENALIDOMIDE 10 MG PO CAPS: 10.0000 mg | ORAL_CAPSULE | Freq: Every day | ORAL | Status: DC

## 2011-02-28 MED ORDER — LENALIDOMIDE 10 MG PO CAPS: 10.0000 mg | ORAL_CAPSULE | Freq: Every day | ORAL | Status: DC

## 2011-02-28 NOTE — Telephone Encounter (Signed)
Addended by: Alyson Locket N on: 02/28/2011 12:10 PM   Modules accepted: Orders

## 2011-02-28 NOTE — Telephone Encounter (Signed)
Reprinted rx as it did not print when it was approved by Dr Myna Hidalgo.

## 2011-03-08 ENCOUNTER — Ambulatory Visit (HOSPITAL_BASED_OUTPATIENT_CLINIC_OR_DEPARTMENT_OTHER): Payer: Medicare Other

## 2011-03-08 ENCOUNTER — Other Ambulatory Visit (HOSPITAL_BASED_OUTPATIENT_CLINIC_OR_DEPARTMENT_OTHER): Payer: Medicare Other | Admitting: Lab

## 2011-03-08 ENCOUNTER — Ambulatory Visit (HOSPITAL_BASED_OUTPATIENT_CLINIC_OR_DEPARTMENT_OTHER): Payer: Medicare Other | Admitting: Hematology & Oncology

## 2011-03-08 DIAGNOSIS — C9000 Multiple myeloma not having achieved remission: Secondary | ICD-10-CM

## 2011-03-08 DIAGNOSIS — Z5112 Encounter for antineoplastic immunotherapy: Secondary | ICD-10-CM

## 2011-03-08 LAB — CBC WITH DIFFERENTIAL (CANCER CENTER ONLY)
BASO#: 0.1 10*3/uL (ref 0.0–0.2)
HCT: 37.6 % — ABNORMAL LOW (ref 38.7–49.9)
HGB: 12.6 g/dL — ABNORMAL LOW (ref 13.0–17.1)
LYMPH#: 1.1 10*3/uL (ref 0.9–3.3)
LYMPH%: 35.7 % (ref 14.0–48.0)
MCHC: 33.5 g/dL (ref 32.0–35.9)
MCV: 101 fL — ABNORMAL HIGH (ref 82–98)
MONO#: 0.6 10*3/uL (ref 0.1–0.9)
NEUT%: 42.3 % (ref 40.0–80.0)
WBC: 3.1 10*3/uL — ABNORMAL LOW (ref 4.0–10.0)

## 2011-03-08 MED ORDER — ZOLEDRONIC ACID 4 MG/100ML IV SOLN
4.0000 mg | Freq: Once | INTRAVENOUS | Status: AC
  Administered 2011-03-08: 4 mg via INTRAVENOUS
  Filled 2011-03-08: qty 100

## 2011-03-08 MED ORDER — BORTEZOMIB CHEMO SQ INJECTION 3.5 MG (2.5MG/ML)
1.3000 mg/m2 | Freq: Once | INTRAMUSCULAR | Status: AC
  Administered 2011-03-08: 2.75 mg via SUBCUTANEOUS
  Filled 2011-03-08: qty 2.75

## 2011-03-08 MED ORDER — ONDANSETRON HCL 8 MG PO TABS
8.0000 mg | ORAL_TABLET | Freq: Once | ORAL | Status: AC
  Administered 2011-03-08: 8 mg via ORAL

## 2011-03-08 MED ORDER — SODIUM CHLORIDE 0.9 % IV SOLN
Freq: Once | INTRAVENOUS | Status: AC
  Administered 2011-03-08: 16:00:00 via INTRAVENOUS

## 2011-03-08 NOTE — Progress Notes (Signed)
CC:   Willow Ora, MD Verita Schneiders, MD  DIAGNOSIS:  IgA kappa myeloma.  CURRENT THERAPY: 1. Revlimid 10 mg p.o. daily (21 days/7 days off). 2. Velcade (3 weeks on/3 weeks off). 3. Zometa 4 mg IV q.6 weeks.  INTERIM HISTORY:  Mr. Maniaci comes in for his followup.  He is still doing fairly well.  He does have episodes of fatigue.  He seems to be doing okay with treatment, however.  He is still trying to go about his daily activities.  His appetite has been good.  His wife is a very good cook.  When we last checked his myeloma studies, everything looked fairly stable.  He did not have a monoclonal spike in his serum back on 02/01/2011.  His IgA level was 226 mg/dL.  His serum free kappa light chain was 1.46 mg/dL.  He has not noted any problems with leg swelling.  He has had no change in bowel or bladder habits.  He does have occasional bouts of diarrhea. He has not noted any bleeding.  He has had no fever.  There has been no bony pain.  He has had no dysphasia or odynophagia.  He has had no double vision or blurred vision.  PHYSICAL EXAMINATION:  General Appearance:  This is a well-developed, well-nourished, white gentleman in no obvious distress.  Vital Signs: Temperature 97.9.  Pulse 76.  Respiratory rate 18.  Blood pressure 159/86.  Weight is 221.  Head and Neck Exam:  A normocephalic, atraumatic skull.  There are no ocular or oral lesions.  There are no palpable cervical or supraclavicular lymph nodes.  Lungs:  Clear bilaterally.  Cardiac Exam:  Regular rate and rhythm with a normal S1 and S2.  There are no murmurs, rubs, or bruits.  Abdominal Exam:  Soft with good bowel sounds.  There is no palpable abdominal mass.  No fluid wave.  No palpable hepatosplenomegaly.  Back Exam:  No tenderness over the spine, ribs, or hips.  Extremities:  No clubbing, cyanosis, or edema.  Neurological Exam:  No focal neurological deficits.  LABORATORY STUDIES:  White cell count 3.1, hemoglobin  12.6, hematocrit 37.6, platelet count is 107.  IMPRESSION:  Mr. Conde is a 71 year old gentleman with IgA kappa myeloma.  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  He underwent this back in November 2011.  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  This is why we are being aggressive with respect to his maintenance therapy.  We started his maintenance therapy back in March 2012.  As such, he has another year to go unless studies show something different.  We will plan to start his next 3 week cycle of Velcade today.  I will plan to get him back to see me in another month or so.    ______________________________ Josph Macho, M.D. PRE/MEDQ  D:  03/08/2011  T:  03/08/2011  Job:  [id number]  ADDENDUM:  (-) M-Spike.  IgA is 268 mg/dL.  KAPPA is 1.41 mg/dL.

## 2011-03-08 NOTE — Progress Notes (Signed)
This office note has been dictated.

## 2011-03-12 LAB — COMPREHENSIVE METABOLIC PANEL
BUN: 11 mg/dL (ref 6–23)
CO2: 23 mEq/L (ref 19–32)
Calcium: 9 mg/dL (ref 8.4–10.5)
Chloride: 104 mEq/L (ref 96–112)
Creatinine, Ser: 0.9 mg/dL (ref 0.50–1.35)
Total Bilirubin: 0.3 mg/dL (ref 0.3–1.2)

## 2011-03-12 LAB — KAPPA/LAMBDA LIGHT CHAINS: Kappa free light chain: 1.41 mg/dL (ref 0.33–1.94)

## 2011-03-12 LAB — PROTEIN ELECTROPHORESIS, SERUM, WITH REFLEX
Beta Globulin: 6.1 % (ref 4.7–7.2)
Total Protein, Serum Electrophoresis: 6.8 g/dL (ref 6.0–8.3)

## 2011-03-12 LAB — IGG, IGA, IGM
IgA: 268 mg/dL (ref 68–379)
IgG (Immunoglobin G), Serum: 933 mg/dL (ref 650–1600)
IgM, Serum: 24 mg/dL — ABNORMAL LOW (ref 41–251)

## 2011-03-15 ENCOUNTER — Other Ambulatory Visit: Payer: Medicare Other | Admitting: Lab

## 2011-03-15 ENCOUNTER — Ambulatory Visit (HOSPITAL_BASED_OUTPATIENT_CLINIC_OR_DEPARTMENT_OTHER): Payer: Medicare Other

## 2011-03-15 VITALS — BP 125/69 | HR 71 | Temp 96.7°F

## 2011-03-15 DIAGNOSIS — Z5112 Encounter for antineoplastic immunotherapy: Secondary | ICD-10-CM

## 2011-03-15 DIAGNOSIS — C9000 Multiple myeloma not having achieved remission: Secondary | ICD-10-CM

## 2011-03-15 LAB — CBC WITH DIFFERENTIAL (CANCER CENTER ONLY)
BASO%: 0.4 % (ref 0.0–2.0)
EOS%: 2.7 % (ref 0.0–7.0)
HCT: 36.1 % — ABNORMAL LOW (ref 38.7–49.9)
LYMPH%: 31.3 % (ref 14.0–48.0)
MCH: 33.4 pg (ref 28.0–33.4)
MCHC: 33.2 g/dL (ref 32.0–35.9)
MCV: 101 fL — ABNORMAL HIGH (ref 82–98)
MONO%: 15.1 % — ABNORMAL HIGH (ref 0.0–13.0)
NEUT%: 50.5 % (ref 40.0–80.0)
RDW: 15 % (ref 11.1–15.7)

## 2011-03-15 MED ORDER — BORTEZOMIB CHEMO SQ INJECTION 3.5 MG (2.5MG/ML)
1.3000 mg/m2 | Freq: Once | INTRAMUSCULAR | Status: AC
  Administered 2011-03-15: 2.75 mg via SUBCUTANEOUS
  Filled 2011-03-15: qty 2.75

## 2011-03-15 MED ORDER — ONDANSETRON HCL 8 MG PO TABS
8.0000 mg | ORAL_TABLET | Freq: Once | ORAL | Status: AC
  Administered 2011-03-15: 8 mg via ORAL

## 2011-03-15 NOTE — Patient Instructions (Signed)
[facility] Discharge Instructions for Patients Receiving Chemotherapy  Today you received the following chemotherapy agents Velcade.  To help prevent nausea and vomiting after your treatment, we encourage you to take your nausea medication.   If you develop nausea and vomiting that is not controlled by your nausea medication, call the clinic. If it is after clinic hours your family physician or the after hours number for the clinic or go to the Emergency Department.   BELOW ARE SYMPTOMS THAT SHOULD BE REPORTED IMMEDIATELY:  *FEVER GREATER THAN 100.5 F  *CHILLS WITH OR WITHOUT FEVER  NAUSEA AND VOMITING THAT IS NOT CONTROLLED WITH YOUR NAUSEA MEDICATION  *UNUSUAL SHORTNESS OF BREATH  *UNUSUAL BRUISING OR BLEEDING  TENDERNESS IN MOUTH AND THROAT WITH OR WITHOUT PRESENCE OF ULCERS  *URINARY PROBLEMS  *BOWEL PROBLEMS  UNUSUAL RASH Items with * indicate a potential emergency and should be followed up as soon as possible.  One of the nurses will contact you 24 hours after your treatment. Please let the nurse know about any problems that you may have experienced. Feel free to call the clinic you have any questions or concerns. The clinic phone number is [phone].   I have been informed and understand all the instructions given to me. I know to contact the clinic, my physician, or go to the Emergency Department if any problems should occur. I do not have any questions at this time, but understand that I may call the clinic during office hours   should I have any questions or need assistance in obtaining follow up care.    __________________________________________  _____________  __________ Signature of Patient or Authorized Representative            Date                   Time    __________________________________________ Nurse's Signature    

## 2011-03-19 ENCOUNTER — Other Ambulatory Visit: Payer: Self-pay | Admitting: *Deleted

## 2011-03-19 MED ORDER — RANITIDINE HCL 300 MG PO CAPS: 300.0000 mg | ORAL_CAPSULE | Freq: Every evening | ORAL | Status: DC

## 2011-03-19 NOTE — Telephone Encounter (Signed)
Rx sent 

## 2011-03-21 ENCOUNTER — Telehealth: Payer: Self-pay | Admitting: *Deleted

## 2011-03-21 NOTE — Telephone Encounter (Signed)
Called patients wife to let her know that patients blood work does not show any obvious myeloma. Per dr. Myna Hidalgo.

## 2011-03-21 NOTE — Telephone Encounter (Signed)
Message copied by Anselm Jungling on Wed Mar 21, 2011 11:08 AM ------      Message from: Josph Macho      Created: Tue Mar 20, 2011  9:05 PM       Call- no obvious myeloma.  pete

## 2011-03-22 ENCOUNTER — Ambulatory Visit (HOSPITAL_BASED_OUTPATIENT_CLINIC_OR_DEPARTMENT_OTHER): Payer: Medicare Other

## 2011-03-22 ENCOUNTER — Other Ambulatory Visit (HOSPITAL_BASED_OUTPATIENT_CLINIC_OR_DEPARTMENT_OTHER): Payer: Medicare Other | Admitting: Lab

## 2011-03-22 VITALS — BP 115/70 | HR 69 | Temp 96.7°F

## 2011-03-22 DIAGNOSIS — Z5112 Encounter for antineoplastic immunotherapy: Secondary | ICD-10-CM

## 2011-03-22 DIAGNOSIS — C9000 Multiple myeloma not having achieved remission: Secondary | ICD-10-CM

## 2011-03-22 LAB — CBC WITH DIFFERENTIAL (CANCER CENTER ONLY)
BASO%: 0.4 % (ref 0.0–2.0)
EOS%: 2.9 % (ref 0.0–7.0)
LYMPH#: 0.9 10*3/uL (ref 0.9–3.3)
MCHC: 33.3 g/dL (ref 32.0–35.9)
NEUT#: 1.3 10*3/uL — ABNORMAL LOW (ref 1.5–6.5)
RDW: 15.1 % (ref 11.1–15.7)

## 2011-03-22 MED ORDER — ONDANSETRON HCL 8 MG PO TABS
8.0000 mg | ORAL_TABLET | Freq: Once | ORAL | Status: AC
  Administered 2011-03-22: 8 mg via ORAL

## 2011-03-22 MED ORDER — BORTEZOMIB CHEMO SQ INJECTION 3.5 MG (2.5MG/ML)
1.3000 mg/m2 | Freq: Once | INTRAMUSCULAR | Status: AC
  Administered 2011-03-22: 2.75 mg via SUBCUTANEOUS
  Filled 2011-03-22: qty 2.75

## 2011-03-22 NOTE — Patient Instructions (Signed)
[facility] Discharge Instructions for Patients Receiving Chemotherapy  Today you received the following chemotherapy agents Velcade.  To help prevent nausea and vomiting after your treatment, we encourage you to take your nausea medication.   If you develop nausea and vomiting that is not controlled by your nausea medication, call the clinic. If it is after clinic hours your family physician or the after hours number for the clinic or go to the Emergency Department.   BELOW ARE SYMPTOMS THAT SHOULD BE REPORTED IMMEDIATELY:  *FEVER GREATER THAN 100.5 F  *CHILLS WITH OR WITHOUT FEVER  NAUSEA AND VOMITING THAT IS NOT CONTROLLED WITH YOUR NAUSEA MEDICATION  *UNUSUAL SHORTNESS OF BREATH  *UNUSUAL BRUISING OR BLEEDING  TENDERNESS IN MOUTH AND THROAT WITH OR WITHOUT PRESENCE OF ULCERS  *URINARY PROBLEMS  *BOWEL PROBLEMS  UNUSUAL RASH Items with * indicate a potential emergency and should be followed up as soon as possible.  One of the nurses will contact you 24 hours after your treatment. Please let the nurse know about any problems that you may have experienced. Feel free to call the clinic you have any questions or concerns. The clinic phone number is [phone].   I have been informed and understand all the instructions given to me. I know to contact the clinic, my physician, or go to the Emergency Department if any problems should occur. I do not have any questions at this time, but understand that I may call the clinic during office hours   should I have any questions or need assistance in obtaining follow up care.    __________________________________________  _____________  __________ Signature of Patient or Authorized Representative            Date                   Time    __________________________________________ Nurse's Signature    

## 2011-03-22 NOTE — Progress Notes (Signed)
CC:   Gene Paz, MD Gene Long, MD  DIAGNOSIS:  IgA kappa myeloma.  CURRENT THERAPY: 1. Revlimid 10 mg p.o. daily (21 days on/7 days off). 2. Velcade every week subcutaneous dosing (3 weeks on/3 weeks off). 3. Zometa 4 mg IV every month.  INTERIM HISTORY:  Gene Hill comes in for his follow-up.  He is doing okay.  He had a good Thanksgiving.  His performance status is improved a little bit.  He is not as fatigued.  When we last checked his myeloma panel in October, his IgA level was 280 mg/dL.  There is no monoclonal spike in his serum, his serum kappa light chain of 1.06 mg/dL.  He has had no cough.  He has had no diarrhea.  He has had no leg swelling.  He has had no rashes.  There is no headache.  PHYSICAL EXAMINATION:  General Appearance:  This is a well-developed, well-nourished white gentleman in no obvious distress.  Vital Signs: Show a temperature of 97.8, pulse 77, respiratory rate 18, blood pressure 149/84.  Weight is 223.  Head and Neck Exam:  Shows a normocephalic, atraumatic skull.  There are no ocular or oral lesions. There are no palpable cervical or supraclavicular lymph nodes.  Lungs: Clear to percussion and auscultation bilaterally.  Cardiac Exam: Regular rate and rhythm with a normal S1 and S2.  There are no murmurs, rubs or bruits.  Abdominal Exam:  Soft with good bowel sounds.  There is no palpable abdominal mass.  There is no fluid wave.  There is no palpable hepatosplenomegaly.  Back Exam:  No tenderness over the spine, ribs or hips.  Extremities:  Show no clubbing, cyanosis or edema.  Skin Exam:  No rashes, ecchymosis, or petechia.  LABORATORY STUDIES:  White cell count is 3, hemoglobin 13.1, hematocrit 38.4, platelet count 122.  Sodium 140, potassium 4, BUN 14, creatinine 0.93.  Calcium 8.9 with an albumin of 4.4.  IMPRESSION:  Gene Hill is a 70-year-old gentleman with IgA kappa myeloma.  He is now a year out from his transplant at [facility].  He has  done very nicely.  He is on maintenance therapy with Revlimid and Velcade. We are being aggressive with him as he has the 17p chromosome deletion.  We will continue him on his present regimen.  Again, he is doing well with this.  He has not shown any stress from the treatment.  I will plan to get him back to see me the first week in January 2013. By then, he will be ready to start his next 3-week cycle of Velcade.  He will continue on his low-dose Coumadin.    ______________________________ Umer Harig R [name], M.D. PRE/MEDQ  D:  12/21/2010  T:  12/22/2010  Job:  [id number] 

## 2011-03-30 ENCOUNTER — Other Ambulatory Visit: Payer: Self-pay | Admitting: *Deleted

## 2011-03-30 MED ORDER — LENALIDOMIDE 10 MG PO CAPS: 10.0000 mg | ORAL_CAPSULE | Freq: Every day | ORAL | Status: DC

## 2011-03-30 NOTE — Telephone Encounter (Signed)
Received refill request from [facility] for Revlimid. Reviewed Dr Gustavo Lah note. No changes in tx.

## 2011-04-24 ENCOUNTER — Other Ambulatory Visit: Payer: Self-pay | Admitting: *Deleted

## 2011-04-24 MED ORDER — LENALIDOMIDE 10 MG PO CAPS: 10.0000 mg | ORAL_CAPSULE | Freq: Every day | ORAL | Status: DC

## 2011-04-24 NOTE — Telephone Encounter (Signed)
Received refill request from [facility] for Revlimid 10 mg. He takes it q21 days with 7 days off. Obtained auth # [id number] from [employer]. Will fax to [facility] at [phone] once signed by Dr Myna Hidalgo.

## 2011-04-30 ENCOUNTER — Other Ambulatory Visit: Payer: Self-pay | Admitting: Hematology & Oncology

## 2011-04-30 DIAGNOSIS — C9000 Multiple myeloma not having achieved remission: Secondary | ICD-10-CM

## 2011-05-01 ENCOUNTER — Telehealth: Payer: Self-pay | Admitting: Hematology & Oncology

## 2011-05-01 NOTE — Telephone Encounter (Signed)
Pt aware of 4-10 appointments and will get schedule when he comes in.

## 2011-05-02 ENCOUNTER — Ambulatory Visit (HOSPITAL_BASED_OUTPATIENT_CLINIC_OR_DEPARTMENT_OTHER): Payer: Medicare Other

## 2011-05-02 ENCOUNTER — Ambulatory Visit (HOSPITAL_BASED_OUTPATIENT_CLINIC_OR_DEPARTMENT_OTHER): Payer: Medicare Other | Admitting: Hematology & Oncology

## 2011-05-02 ENCOUNTER — Other Ambulatory Visit (HOSPITAL_BASED_OUTPATIENT_CLINIC_OR_DEPARTMENT_OTHER): Payer: Medicare Other | Admitting: Lab

## 2011-05-02 VITALS — BP 148/84 | HR 79 | Temp 97.4°F | Ht 67.0 in | Wt 223.0 lb

## 2011-05-02 DIAGNOSIS — E291 Testicular hypofunction: Secondary | ICD-10-CM

## 2011-05-02 DIAGNOSIS — Z5112 Encounter for antineoplastic immunotherapy: Secondary | ICD-10-CM

## 2011-05-02 DIAGNOSIS — C9000 Multiple myeloma not having achieved remission: Secondary | ICD-10-CM

## 2011-05-02 DIAGNOSIS — E349 Endocrine disorder, unspecified: Secondary | ICD-10-CM

## 2011-05-02 LAB — CMP (CANCER CENTER ONLY)
AST: 30 U/L (ref 11–38)
Alkaline Phosphatase: 49 U/L (ref 26–84)
BUN, Bld: 14 mg/dL (ref 7–22)
Creat: 1 mg/dl (ref 0.6–1.2)

## 2011-05-02 LAB — CBC WITH DIFFERENTIAL (CANCER CENTER ONLY)
BASO%: 1.2 % (ref 0.0–2.0)
LYMPH#: 1.2 10*3/uL (ref 0.9–3.3)
LYMPH%: 44.2 % (ref 14.0–48.0)
MCV: 101 fL — ABNORMAL HIGH (ref 82–98)
MONO#: 0.4 10*3/uL (ref 0.1–0.9)
Platelets: 96 10*3/uL — ABNORMAL LOW (ref 145–400)
RDW: 14.7 % (ref 11.1–15.7)
WBC: 2.6 10*3/uL — ABNORMAL LOW (ref 4.0–10.0)

## 2011-05-02 MED ORDER — BORTEZOMIB CHEMO SQ INJECTION 3.5 MG (2.5MG/ML)
1.3000 mg/m2 | Freq: Once | INTRAMUSCULAR | Status: AC
  Administered 2011-05-02: 2.75 mg via SUBCUTANEOUS
  Filled 2011-05-02: qty 2.75

## 2011-05-02 MED ORDER — ZOLEDRONIC ACID 4 MG/100ML IV SOLN
4.0000 mg | Freq: Once | INTRAVENOUS | Status: AC
  Administered 2011-05-02: 4 mg via INTRAVENOUS
  Filled 2011-05-02: qty 100

## 2011-05-02 MED ORDER — SODIUM CHLORIDE 0.9 % IV SOLN
Freq: Once | INTRAVENOUS | Status: AC
  Administered 2011-05-02: 12:00:00 via INTRAVENOUS

## 2011-05-02 MED ORDER — ONDANSETRON HCL 8 MG PO TABS
8.0000 mg | ORAL_TABLET | Freq: Once | ORAL | Status: AC
  Administered 2011-05-02: 8 mg via ORAL

## 2011-05-02 NOTE — Patient Instructions (Signed)
[facility] Discharge Instructions for Patients Receiving Chemotherapy  Today you received the following chemotherapy agents Velcade.  To help prevent nausea and vomiting after your treatment, we encourage you to take your nausea medication.   If you develop nausea and vomiting that is not controlled by your nausea medication, call the clinic. If it is after clinic hours your family physician or the after hours number for the clinic or go to the Emergency Department.   BELOW ARE SYMPTOMS THAT SHOULD BE REPORTED IMMEDIATELY:  *FEVER GREATER THAN 100.5 F  *CHILLS WITH OR WITHOUT FEVER  NAUSEA AND VOMITING THAT IS NOT CONTROLLED WITH YOUR NAUSEA MEDICATION  *UNUSUAL SHORTNESS OF BREATH  *UNUSUAL BRUISING OR BLEEDING  TENDERNESS IN MOUTH AND THROAT WITH OR WITHOUT PRESENCE OF ULCERS  *URINARY PROBLEMS  *BOWEL PROBLEMS  UNUSUAL RASH Items with * indicate a potential emergency and should be followed up as soon as possible.  One of the nurses will contact you 24 hours after your treatment. Please let the nurse know about any problems that you may have experienced. Feel free to call the clinic you have any questions or concerns. The clinic phone number is [phone].   I have been informed and understand all the instructions given to me. I know to contact the clinic, my physician, or go to the Emergency Department if any problems should occur. I do not have any questions at this time, but understand that I may call the clinic during office hours   should I have any questions or need assistance in obtaining follow up care.    __________________________________________  _____________  __________ Signature of Patient or Authorized Representative            Date                   Time    __________________________________________ Nurse's Signature    

## 2011-05-02 NOTE — Progress Notes (Signed)
This office note has been dictated.

## 2011-05-03 NOTE — Progress Notes (Signed)
DIAGNOSIS:  IgA kappa myeloma.  CURRENT THERAPY: 1. Patient on maintenance therapy with Revlimid 10 mg p.o. daily (21     days on/7 days off) and Velcade (3 weeks on/3 weeks off). 2. Zometa 4 mg IV q.6 weeks.  INTERIM HISTORY:  Gene Hill comes in for followup.  He is still having a little bit of difficulty.  He has no energy.  I was wondering if his testosterone level is not low.  I am going to go ahead and check his testosterone level.  If it is low, then we can give him some supplemental testosterone.  His myeloma studies have been looking good.  His last myeloma studies back in February did not show any evidence of monoclonal spike.  His IgA level was 268 mg/dL.  His serum kappa light chain was 1.41 mg/dL.  Again, his fatigue might be related to low testosterone.  If so, we will make sure he gets supplementation.  He has had some diarrhea.  He is not taking anything for the diarrhea. I told him he could probably try some Imodium.  He is not complaining of any kind of bony pain.  His appetite is okay.  He has had no nausea or vomiting.  He has had an no leg swelling.  There have been no rashes.  PHYSICAL EXAMINATION:  This is a well-developed, well-nourished white gentleman in no obvious distress.  Vital signs:  Temperature of 97.4, pulse 79, respiratory rate 18, blood pressure 140/84.  Weight is 223. Head and neck exam shows a normocephalic, atraumatic skull.  There are no ocular or oral lesions.  There are no palpable cervical or supraclavicular lymph nodes.  Lungs:  Clear bilaterally.  Cardiac: Regular rate and rhythm with a normal S1 and S2.  There are no murmurs, rubs or bruits.  Abdomen:  Soft with good bowel sounds.  There is no palpable abdominal mass.  There is no fluid wave.  There is no palpable hepatosplenomegaly.  Back:  No tenderness over the spine, ribs, or hips. Extremities show no clubbing, cyanosis or edema.  Neurological exam shows no focal neurological  deficits.  Skin:  No rashes, ecchymoses or petechia.  LABORATORY STUDIES:  White cell count 2.6, hemoglobin 12.4, hematocrit 36.7, platelet count 96,000.  IMPRESSION:  Gene Hill is a 71 year old gentleman with IgA kappa myeloma.  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  This was back in November of 2011.  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  As such, we are trying to be aggressive in the maintenance therapy to try to minimize his risk of recurrence.  We will go ahead and get him started on his next 3-week cycle of treatment.  Hopefully, he will tolerate this okay.  Hopefully, he will not have issues with diarrhea.  We will go ahead and plan to get him back to see [place] in another month for followup.  Again, we will see what his testosterone level shows.  If it is low, we will be more than happy to replace it. Jeanmarie Hubert, a dull ache thanks.    ______________________________ Gene Hill, M.D. PRE/MEDQ  D:  05/02/2011  T:  05/03/2011  Job:  [id number]   ADDENDUM:  (-) M-Spike. IgA is 244 mg/dl.  KAPPA is 1.74 mg/dL.

## 2011-05-04 LAB — IGG, IGA, IGM
IgA: 244 mg/dL (ref 68–379)
IgM, Serum: 23 mg/dL — ABNORMAL LOW (ref 41–251)

## 2011-05-04 LAB — PROTEIN ELECTROPHORESIS, SERUM, WITH REFLEX
Alpha-1-Globulin: 4.3 % (ref 2.9–4.9)
Alpha-2-Globulin: 11.8 % (ref 7.1–11.8)
Gamma Globulin: 12.7 % (ref 11.1–18.8)
Total Protein, Serum Electrophoresis: 7.1 g/dL (ref 6.0–8.3)

## 2011-05-04 LAB — KAPPA/LAMBDA LIGHT CHAINS: Lambda Free Lght Chn: 1.39 mg/dL (ref 0.57–2.63)

## 2011-05-04 LAB — TESTOSTERONE: Testosterone: 173.16 ng/dL — ABNORMAL LOW (ref 300–890)

## 2011-05-10 ENCOUNTER — Other Ambulatory Visit (HOSPITAL_BASED_OUTPATIENT_CLINIC_OR_DEPARTMENT_OTHER): Payer: Medicare Other | Admitting: Lab

## 2011-05-10 ENCOUNTER — Ambulatory Visit (HOSPITAL_BASED_OUTPATIENT_CLINIC_OR_DEPARTMENT_OTHER): Payer: Medicare Other

## 2011-05-10 VITALS — BP 132/78 | HR 66 | Temp 97.0°F

## 2011-05-10 DIAGNOSIS — Z5112 Encounter for antineoplastic immunotherapy: Secondary | ICD-10-CM

## 2011-05-10 DIAGNOSIS — C9000 Multiple myeloma not having achieved remission: Secondary | ICD-10-CM

## 2011-05-10 LAB — CBC WITH DIFFERENTIAL (CANCER CENTER ONLY)
BASO#: 0 10*3/uL (ref 0.0–0.2)
EOS%: 2.5 % (ref 0.0–7.0)
Eosinophils Absolute: 0.1 10*3/uL (ref 0.0–0.5)
HGB: 12.2 g/dL — ABNORMAL LOW (ref 13.0–17.1)
LYMPH#: 0.8 10*3/uL — ABNORMAL LOW (ref 0.9–3.3)
MCHC: 33.5 g/dL (ref 32.0–35.9)
NEUT#: 1.2 10*3/uL — ABNORMAL LOW (ref 1.5–6.5)
RBC: 3.54 10*6/uL — ABNORMAL LOW (ref 4.20–5.70)

## 2011-05-10 LAB — BASIC METABOLIC PANEL
BUN: 13 mg/dL (ref 6–23)
Chloride: 104 mEq/L (ref 96–112)
Potassium: 4 mEq/L (ref 3.5–5.3)

## 2011-05-10 MED ORDER — ONDANSETRON HCL 8 MG PO TABS
8.0000 mg | ORAL_TABLET | Freq: Once | ORAL | Status: AC
  Administered 2011-05-10: 8 mg via ORAL

## 2011-05-10 MED ORDER — BORTEZOMIB CHEMO SQ INJECTION 3.5 MG (2.5MG/ML)
1.3000 mg/m2 | Freq: Once | INTRAMUSCULAR | Status: AC
  Administered 2011-05-10: 2.75 mg via SUBCUTANEOUS
  Filled 2011-05-10: qty 2.75

## 2011-05-10 NOTE — Progress Notes (Signed)
Patient's wife reports that appointment at [facility] has been rescheduled for June 28, 2011. Teola Bradley, Jinger Middlesworth [employer]

## 2011-05-10 NOTE — Patient Instructions (Signed)

## 2011-05-17 ENCOUNTER — Ambulatory Visit (HOSPITAL_BASED_OUTPATIENT_CLINIC_OR_DEPARTMENT_OTHER): Payer: Medicare Other

## 2011-05-17 ENCOUNTER — Other Ambulatory Visit: Payer: Medicare Other | Admitting: Lab

## 2011-05-17 VITALS — BP 132/72 | HR 90 | Temp 98.0°F

## 2011-05-17 DIAGNOSIS — C9000 Multiple myeloma not having achieved remission: Secondary | ICD-10-CM

## 2011-05-17 DIAGNOSIS — Z5112 Encounter for antineoplastic immunotherapy: Secondary | ICD-10-CM

## 2011-05-17 LAB — CBC WITH DIFFERENTIAL (CANCER CENTER ONLY)
BASO#: 0 10*3/uL (ref 0.0–0.2)
Eosinophils Absolute: 0.1 10*3/uL (ref 0.0–0.5)
HCT: 34.7 % — ABNORMAL LOW (ref 38.7–49.9)
HGB: 11.8 g/dL — ABNORMAL LOW (ref 13.0–17.1)
MCH: 34.7 pg — ABNORMAL HIGH (ref 28.0–33.4)
MCV: 102 fL — ABNORMAL HIGH (ref 82–98)
MONO%: 21.2 % — ABNORMAL HIGH (ref 0.0–13.0)
NEUT#: 1 10*3/uL — ABNORMAL LOW (ref 1.5–6.5)
NEUT%: 40.8 % (ref 40.0–80.0)
RBC: 3.4 10*6/uL — ABNORMAL LOW (ref 4.20–5.70)

## 2011-05-17 LAB — BASIC METABOLIC PANEL
CO2: 27 mEq/L (ref 19–32)
Chloride: 105 mEq/L (ref 96–112)
Creatinine, Ser: 0.86 mg/dL (ref 0.50–1.35)
Potassium: 4 mEq/L (ref 3.5–5.3)
Sodium: 140 mEq/L (ref 135–145)

## 2011-05-17 MED ORDER — ONDANSETRON HCL 8 MG PO TABS
8.0000 mg | ORAL_TABLET | Freq: Once | ORAL | Status: AC
  Administered 2011-05-17: 8 mg via ORAL

## 2011-05-17 MED ORDER — BORTEZOMIB CHEMO SQ INJECTION 3.5 MG (2.5MG/ML)
1.3000 mg/m2 | Freq: Once | INTRAMUSCULAR | Status: AC
  Administered 2011-05-17: 2.75 mg via SUBCUTANEOUS
  Filled 2011-05-17: qty 2.75

## 2011-05-17 NOTE — Patient Instructions (Signed)
[facility] for [facility] Receiving [facility]otherapy  Today you received the following chemotherapy agents Velcade.  To help prevent nausea and vomiting after your treatment, we encourage you to take your nausea medication.   If you develop nausea and vomiting that is not controlled by your nausea medication, call the clinic. If it is after clinic hours your family physician or the after hours number for the clinic or go to the Emergency Department.   BELOW ARE SYMPTOMS THAT SHOULD BE REPORTED IMMEDIATELY:  *FEVER GREATER THAN 100.5 F  *CHILLS WITH OR WITHOUT FEVER  NAUSEA AND VOMITING THAT IS NOT CONTROLLED WITH YOUR NAUSEA MEDICATION  *UNUSUAL SHORTNESS OF BREATH  *UNUSUAL BRUISING OR BLEEDING  TENDERNESS IN MOUTH AND THROAT WITH OR WITHOUT PRESENCE OF ULCERS  *URINARY PROBLEMS  *BOWEL PROBLEMS  UNUSUAL RASH Items with * indicate a potential emergency and should be followed up as soon as possible.  One of the nurses will contact you 24 hours after your treatment. Please let the nurse know about any problems that you may have experienced. Feel free to call the clinic you have any questions or concerns. The clinic phone number is [phone].   I have been informed and understand all the instructions given to me. I know to contact the clinic, my physician, or go to the Emergency Department if any problems should occur. I do not have any questions at this time, but understand that I may call the clinic during office hours   should I have any questions or need assistance in obtaining follow up care.    __________________________________________  _____________  __________ Signature of Patient or Authorized Representative            Date                   Time    __________________________________________ Nurse's Signature    

## 2011-05-18 ENCOUNTER — Other Ambulatory Visit: Payer: Self-pay | Admitting: *Deleted

## 2011-05-18 MED ORDER — LENALIDOMIDE 10 MG PO CAPS: 10.0000 mg | ORAL_CAPSULE | Freq: Every day | ORAL | Status: DC

## 2011-05-18 NOTE — Telephone Encounter (Signed)
Received refill request for Revlimid 10 mg from [facility]. Obtained auth # [id number] from [employer]. Faxed signed rx to [phone]

## 2011-06-08 ENCOUNTER — Ambulatory Visit (HOSPITAL_BASED_OUTPATIENT_CLINIC_OR_DEPARTMENT_OTHER): Payer: Medicare Other

## 2011-06-08 ENCOUNTER — Other Ambulatory Visit (HOSPITAL_BASED_OUTPATIENT_CLINIC_OR_DEPARTMENT_OTHER): Payer: Medicare Other | Admitting: Lab

## 2011-06-08 ENCOUNTER — Ambulatory Visit (HOSPITAL_BASED_OUTPATIENT_CLINIC_OR_DEPARTMENT_OTHER): Payer: Medicare Other | Admitting: Hematology & Oncology

## 2011-06-08 VITALS — BP 149/79 | HR 82 | Temp 96.9°F | Ht 67.0 in | Wt 224.0 lb

## 2011-06-08 DIAGNOSIS — C9000 Multiple myeloma not having achieved remission: Secondary | ICD-10-CM

## 2011-06-08 DIAGNOSIS — E349 Endocrine disorder, unspecified: Secondary | ICD-10-CM

## 2011-06-08 DIAGNOSIS — D649 Anemia, unspecified: Secondary | ICD-10-CM

## 2011-06-08 DIAGNOSIS — E291 Testicular hypofunction: Secondary | ICD-10-CM

## 2011-06-08 DIAGNOSIS — Z5112 Encounter for antineoplastic immunotherapy: Secondary | ICD-10-CM

## 2011-06-08 LAB — CBC WITH DIFFERENTIAL (CANCER CENTER ONLY)
BASO#: 0 10*3/uL (ref 0.0–0.2)
Eosinophils Absolute: 0.1 10*3/uL (ref 0.0–0.5)
LYMPH%: 30 % (ref 14.0–48.0)
MCH: 34.8 pg — ABNORMAL HIGH (ref 28.0–33.4)
MCHC: 34.7 g/dL (ref 32.0–35.9)
MCV: 100 fL — ABNORMAL HIGH (ref 82–98)
MONO%: 14.4 % — ABNORMAL HIGH (ref 0.0–13.0)
Platelets: 119 10*3/uL — ABNORMAL LOW (ref 145–400)
RBC: 3.59 10*6/uL — ABNORMAL LOW (ref 4.20–5.70)

## 2011-06-08 MED ORDER — ZOLEDRONIC ACID 4 MG/100ML IV SOLN
4.0000 mg | Freq: Once | INTRAVENOUS | Status: AC
  Administered 2011-06-08: 4 mg via INTRAVENOUS
  Filled 2011-06-08: qty 100

## 2011-06-08 MED ORDER — BORTEZOMIB CHEMO SQ INJECTION 3.5 MG (2.5MG/ML)
1.3000 mg/m2 | Freq: Once | INTRAMUSCULAR | Status: AC
  Administered 2011-06-08: 2.75 mg via SUBCUTANEOUS
  Filled 2011-06-08: qty 2.75

## 2011-06-08 MED ORDER — ALTEPLASE 2 MG IJ SOLR
2.0000 mg | Freq: Once | INTRAMUSCULAR | Status: DC | PRN
  Filled 2011-06-08: qty 2

## 2011-06-08 MED ORDER — TESTOSTERONE 50 MG/5GM (1%) TD GEL: 5.0000 g | Freq: Every day | TRANSDERMAL | Status: DC

## 2011-06-08 MED ORDER — SODIUM CHLORIDE 0.9 % IJ SOLN
3.0000 mL | Freq: Once | INTRAMUSCULAR | Status: DC | PRN
  Filled 2011-06-08: qty 10

## 2011-06-08 MED ORDER — HEPARIN SOD (PORK) LOCK FLUSH 100 UNIT/ML IV SOLN
250.0000 [IU] | Freq: Once | INTRAVENOUS | Status: DC | PRN
  Filled 2011-06-08: qty 5

## 2011-06-08 MED ORDER — SODIUM CHLORIDE 0.9 % IJ SOLN
10.0000 mL | INTRAMUSCULAR | Status: DC | PRN
Start: 2011-06-08 — End: 2011-06-08
  Filled 2011-06-08: qty 10

## 2011-06-08 MED ORDER — HEPARIN SOD (PORK) LOCK FLUSH 100 UNIT/ML IV SOLN
500.0000 [IU] | Freq: Once | INTRAVENOUS | Status: DC | PRN
  Filled 2011-06-08: qty 5

## 2011-06-08 MED ORDER — ONDANSETRON HCL 8 MG PO TABS
8.0000 mg | ORAL_TABLET | Freq: Once | ORAL | Status: AC
  Administered 2011-06-08: 8 mg via ORAL

## 2011-06-08 NOTE — Progress Notes (Signed)
This office note has been dictated.

## 2011-06-09 NOTE — Progress Notes (Signed)
CC:   Verita Schneiders, MD Willow Ora, MD  DIAGNOSIS: 1. IgA kappa myeloma.  CURRENT THERAPY: 1. Maintenance therapy with Revlimid 10 mg p.o. daily (21 days on/7     days off). 2. Velcade (3 weeks on/3 weeks off). 3. Zometa 4 mg IV q.6 weeks.  INTERIM HISTORY:  Mr. Dettore comes in for his followup.  He is doing okay.  He still has low energy.  He really gets more tired when he starts Revlimid.  We did check his testosterone last time. His level is on the low side. I think his level was 178.  As such, I went ahead and gave him a prescription for AndroGel to take.  Hopefully this will help with his fatigue.  With his last myeloma studies, he had no detectable monoclonal spike. His IgA level was 244 mg/dL.  His kappa light chain was 1.74 mg/dL.  Because of the weather, he has not really been able to plant his garden. Hopefully, he can get this done soon.  He has had some loose stools.  I told him he could try Imodium or Lomotil.  I think the Lomotil is helping him out.  He has had no leg swelling.  He has had no rashes.  He has had no fever. There has been no bony pain.  He has had no cough or shortness breath.  He is on Coumadin at 1 mg a day for DVT prophylaxis.  PHYSICAL EXAMINATION:  This is a well-developed, well-nourished white gentleman in no obvious distress.  Vital signs:  96.9, pulse 82, respiratory rate 20, blood pressure 149/79.  Weight is 224.  Head and neck:  Normocephalic, atraumatic skull.  There are no ocular or oral lesions.  There are no palpable cervical or supraclavicular lymph nodes. Lungs:  Clear to percussion and auscultation bilaterally.  Cardiac: Regular rate and rhythm with a normal S1 and S2.  There are no murmurs, rubs or bruits.  Abdomen:  Soft with good bowel sounds.  There is no palpable abdominal mass.  There is no palpable hepatosplenomegaly. Extremities:  No clubbing, cyanosis or edema.  Neurologic:  No focal neurological deficits.  Skin: No  rashes, ecchymoses or petechia.  LABORATORY STUDIES:  White cell count is 2.6, hemoglobin 12.5, hematocrit 36, platelet count 119.  IMPRESSION:  Mr. Maraj is a 71 year old gentleman with IgA kappa myeloma.  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  He did get into remission fairly quickly.  He then underwent stem cell transplantation at [facility] back in November 2011.  I think it is hard to really decide as to how long to continue with the maintenance therapy.  I know studies seem to suggest 2 years is what is appropriate.  I think the real "monkey wrench" is the 17p deletion abnormality that he has.  I probably will get him set up with another bone marrow test sometime in the fall.  We will recheck his cytogenetics and FISH studies.  It would be interesting to see what happens to his overall performance status and quality of life when we give him supplemental testosterone.  Will plan to get him back in June to see [place].    ______________________________ Josph Macho, M.D. PRE/MEDQ  D:  06/08/2011  T:  06/09/2011  Job:  [id number]

## 2011-06-13 ENCOUNTER — Other Ambulatory Visit: Payer: Self-pay | Admitting: *Deleted

## 2011-06-13 LAB — IGG, IGA, IGM
IgA: 256 mg/dL (ref 68–379)
IgG (Immunoglobin G), Serum: 901 mg/dL (ref 650–1600)
IgM, Serum: 21 mg/dL — ABNORMAL LOW (ref 41–251)

## 2011-06-13 LAB — PROTEIN ELECTROPHORESIS, SERUM, WITH REFLEX
Alpha-1-Globulin: 4.2 % (ref 2.9–4.9)
Beta 2: 6.2 % (ref 3.2–6.5)
Gamma Globulin: 11.6 % (ref 11.1–18.8)

## 2011-06-13 LAB — COMPREHENSIVE METABOLIC PANEL
Alkaline Phosphatase: 39 U/L (ref 39–117)
BUN: 12 mg/dL (ref 6–23)
CO2: 26 mEq/L (ref 19–32)
Creatinine, Ser: 0.79 mg/dL (ref 0.50–1.35)
Glucose, Bld: 87 mg/dL (ref 70–99)
Sodium: 138 mEq/L (ref 135–145)
Total Bilirubin: 0.4 mg/dL (ref 0.3–1.2)
Total Protein: 6.5 g/dL (ref 6.0–8.3)

## 2011-06-13 LAB — TESTOSTERONE: Testosterone: 153.22 ng/dL — ABNORMAL LOW (ref 300–890)

## 2011-06-13 LAB — KAPPA/LAMBDA LIGHT CHAINS
Kappa free light chain: 1.75 mg/dL (ref 0.33–1.94)
Kappa:Lambda Ratio: 1.33 (ref 0.26–1.65)
Lambda Free Lght Chn: 1.32 mg/dL (ref 0.57–2.63)

## 2011-06-13 MED ORDER — LENALIDOMIDE 10 MG PO CAPS: 10.0000 mg | ORAL_CAPSULE | Freq: Every day | ORAL | Status: DC

## 2011-06-13 NOTE — Telephone Encounter (Signed)
Received refill request from [facility] for Revlimid 10 mg q21 days. This is a chronic med for the pt with no changes per Dr Gustavo Lah most recent visit. Will have him sign rx and fax back to [phone]

## 2011-06-14 ENCOUNTER — Ambulatory Visit (HOSPITAL_BASED_OUTPATIENT_CLINIC_OR_DEPARTMENT_OTHER): Payer: Medicare Other

## 2011-06-14 ENCOUNTER — Telehealth: Payer: Self-pay | Admitting: *Deleted

## 2011-06-14 ENCOUNTER — Other Ambulatory Visit: Payer: Medicare Other | Admitting: Lab

## 2011-06-14 VITALS — BP 116/75 | HR 72 | Temp 97.9°F

## 2011-06-14 DIAGNOSIS — C9000 Multiple myeloma not having achieved remission: Secondary | ICD-10-CM

## 2011-06-14 DIAGNOSIS — Z5112 Encounter for antineoplastic immunotherapy: Secondary | ICD-10-CM

## 2011-06-14 MED ORDER — BORTEZOMIB CHEMO SQ INJECTION 3.5 MG (2.5MG/ML)
1.3000 mg/m2 | Freq: Once | INTRAMUSCULAR | Status: AC
  Administered 2011-06-14: 2.75 mg via SUBCUTANEOUS
  Filled 2011-06-14: qty 2.75

## 2011-06-14 MED ORDER — ONDANSETRON HCL 8 MG PO TABS
8.0000 mg | ORAL_TABLET | Freq: Once | ORAL | Status: AC
  Administered 2011-06-14: 8 mg via ORAL

## 2011-06-14 NOTE — Patient Instructions (Signed)

## 2011-06-14 NOTE — Telephone Encounter (Signed)
Message copied by Anselm Jungling on Thu Jun 14, 2011 11:51 AM ------      Message from: Arlan Organ R      Created: Mon Jun 11, 2011  5:52 PM       Call - no active myeloma. Testosterone is very low!!!  pete

## 2011-06-14 NOTE — Telephone Encounter (Signed)
Called patient to let him know that his testosterone is very low.  Patient aware and is waiting for his testosterone medicine to arrive by mail.

## 2011-06-15 ENCOUNTER — Other Ambulatory Visit: Payer: Self-pay | Admitting: Hematology & Oncology

## 2011-06-15 ENCOUNTER — Telehealth: Payer: Self-pay | Admitting: Hematology & Oncology

## 2011-06-15 NOTE — Telephone Encounter (Signed)
Pt aware of 6-4 appointment °

## 2011-06-21 ENCOUNTER — Ambulatory Visit: Payer: Medicare Other

## 2011-06-21 ENCOUNTER — Other Ambulatory Visit: Payer: Medicare Other | Admitting: Lab

## 2011-06-26 ENCOUNTER — Encounter: Payer: Self-pay | Admitting: Hematology & Oncology

## 2011-06-26 ENCOUNTER — Ambulatory Visit: Payer: Medicare Other

## 2011-06-26 ENCOUNTER — Other Ambulatory Visit (HOSPITAL_BASED_OUTPATIENT_CLINIC_OR_DEPARTMENT_OTHER): Payer: Medicare Other | Admitting: Lab

## 2011-06-26 ENCOUNTER — Ambulatory Visit (HOSPITAL_BASED_OUTPATIENT_CLINIC_OR_DEPARTMENT_OTHER): Payer: Medicare Other | Admitting: Hematology & Oncology

## 2011-06-26 VITALS — BP 149/82 | HR 80 | Temp 97.6°F | Ht 67.0 in | Wt 224.0 lb

## 2011-06-26 DIAGNOSIS — C9000 Multiple myeloma not having achieved remission: Secondary | ICD-10-CM

## 2011-06-26 DIAGNOSIS — E291 Testicular hypofunction: Secondary | ICD-10-CM

## 2011-06-26 DIAGNOSIS — E349 Endocrine disorder, unspecified: Secondary | ICD-10-CM

## 2011-06-26 DIAGNOSIS — D649 Anemia, unspecified: Secondary | ICD-10-CM

## 2011-06-26 HISTORY — DX: Endocrine disorder, unspecified: E34.9

## 2011-06-26 LAB — CBC WITH DIFFERENTIAL (CANCER CENTER ONLY)
BASO#: 0 10*3/uL (ref 0.0–0.2)
EOS%: 4 % (ref 0.0–7.0)
HGB: 12.2 g/dL — ABNORMAL LOW (ref 13.0–17.1)
LYMPH%: 35.2 % (ref 14.0–48.0)
MCH: 34 pg — ABNORMAL HIGH (ref 28.0–33.4)
MCHC: 32.8 g/dL (ref 32.0–35.9)
MONO%: 14.4 % — ABNORMAL HIGH (ref 0.0–13.0)
NEUT#: 1.1 10*3/uL — ABNORMAL LOW (ref 1.5–6.5)

## 2011-06-26 MED ORDER — TESTOSTERONE CYPIONATE 200 MG/ML IM SOLN
400.0000 mg | Freq: Once | INTRAMUSCULAR | Status: AC
  Administered 2011-06-26: 400 mg via INTRAMUSCULAR

## 2011-06-26 NOTE — Progress Notes (Signed)
This office note has been dictated.

## 2011-06-26 NOTE — Progress Notes (Signed)
CC:   Willow Ora, MD Verita Schneiders, MD  DIAGNOSES: 1. IgA kappa myeloma. 2. Hypotestosteronemia.  CURRENT THERAPY:  The patient is to start Depo-Testosterone 400 mg IM every 3 weeks.  INTERIM HISTORY:  Gene Hill comes in for followup.  Unfortunately, he has not yet gotten his supplemental testosterone.  For some reason, his insurance company is denying the AndroGel.  As such, we got approval to give him Depo-Testosterone in the office.  He is not feeling well.  He still feels very tired.  He just does not have much in the way of energy.  Some of his fatigue and difficulties are still from the Revlimid "in his system."  I told him this is probably going to take a good 6-8 weeks before it gets out of his system.  He stopped this about 2-3 weeks ago.  When we last checked his monoclonal levels, he did not have a monoclonal protein in his serum.  His IgG level was 901; IgA was 256 mg/dL.  His serum kappa light chain was 1.75 mg/dL.  He has had no fever.  He still has some diarrhea.  It has been more of a chronic issue.  He has had no leg swelling.  He has had no rashes.  He has had no bony pain.  PHYSICAL EXAMINATION:  General:  This is a mildly obese white gentleman in no obvious distress.  Vital Signs:  Show a temperature of 97.6, pulse 82, respiratory rate 16, blood pressure 149/82, weight is 224.  Head and Neck Exam:  Shows a normocephalic, atraumatic skull.  There are no ocular or oral lesions.  There are no palpable cervical or supraclavicular lymph nodes.  Lungs:  Clear to percussion and auscultation bilaterally.  Cardiac Exam:  Regular rate and rhythm with a normal S1 and S2.  There are no murmurs, rubs, or bruits.  Abdominal Exam:  Soft with good bowel sounds.  There is no palpable abdominal mass.  There is no palpable hepatosplenomegaly.  Back Exam:  No tenderness over the spine, ribs, or hips.  Extremities:  Show no clubbing, cyanosis, or edema.  Neurological Exam:   Shows no focal neurological deficits.  LABORATORY STUDIES:  White cell count is 2.5, hemoglobin 12.2, hematocrit 37.2, platelet count 106.  IMPRESSION:  Gene Hill is a 71 year old gentleman with history of IgA kappa myeloma.  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  He got this back in November 2011.  Because of issues with his maintenance therapy, we just are holding everything right now.  I just want Gene Hill's quality of life to be better.  I think that the Revlimid and the Velcade have been tough on him over the past couple of months and this is not good quality for Gene Hill.  I am confident that giving him testosterone will make him feel better. Hopefully, when we see him back in 6 weeks, he will be feeling better.  At some point in the fall, I may do a bone marrow test on him after Labor Day so that we can assess his status.  Again, I will see Gene Hill back in 6 weeks.  Hopefully, we will get approval for testosterone to be given at home via gel, which would make life a lot easier for Gene Hill.    ______________________________ Josph Macho, M.D. PRE/MEDQ  D:  06/26/2011  T:  06/26/2011  Job:  [id number]   ADDENDUM:  (-) M-Spike.  IgA is 247 mg/dL.  KAPPA  is 1.50 mg/dL.

## 2011-06-28 LAB — IFE INTERPRETATION

## 2011-06-28 LAB — PROTEIN ELECTROPHORESIS, SERUM, WITH REFLEX: Gamma Globulin: 12.4 % (ref 11.1–18.8)

## 2011-06-28 LAB — KAPPA/LAMBDA LIGHT CHAINS: Kappa free light chain: 1.42 mg/dL (ref 0.33–1.94)

## 2011-06-28 LAB — COMPREHENSIVE METABOLIC PANEL
AST: 20 U/L (ref 0–37)
Albumin: 4.3 g/dL (ref 3.5–5.2)
Alkaline Phosphatase: 45 U/L (ref 39–117)
BUN: 14 mg/dL (ref 6–23)
Potassium: 3.9 mEq/L (ref 3.5–5.3)
Sodium: 141 mEq/L (ref 135–145)
Total Protein: 6.8 g/dL (ref 6.0–8.3)

## 2011-06-28 LAB — IRON AND TIBC
TIBC: 353 ug/dL (ref 215–435)
UIBC: 278 ug/dL (ref 125–400)

## 2011-06-28 LAB — TESTOSTERONE: Testosterone: 170.33 ng/dL — ABNORMAL LOW (ref 300–890)

## 2011-07-03 ENCOUNTER — Telehealth: Payer: Self-pay | Admitting: *Deleted

## 2011-07-03 NOTE — Telephone Encounter (Signed)
Message copied by Anselm Jungling on Tue Jul 03, 2011  9:54 AM ------      Message from: Arlan Organ R      Created: Thu Jun 28, 2011  4:39 PM       Please call and tell him that there is still no evidence of myeloma in his blood.  pete

## 2011-07-03 NOTE — Telephone Encounter (Signed)
Called patient wife to let her know that there is no evidence of myeloma in patients blood per dr. Myna Hidalgo

## 2011-07-05 ENCOUNTER — Other Ambulatory Visit: Payer: Self-pay | Admitting: *Deleted

## 2011-07-05 NOTE — Telephone Encounter (Signed)
error 

## 2011-07-11 ENCOUNTER — Other Ambulatory Visit: Payer: Medicare Other | Admitting: Lab

## 2011-07-11 ENCOUNTER — Ambulatory Visit: Payer: Medicare Other

## 2011-07-11 ENCOUNTER — Ambulatory Visit: Payer: Medicare Other | Admitting: Hematology & Oncology

## 2011-07-17 ENCOUNTER — Ambulatory Visit: Payer: Medicare Other

## 2011-07-17 ENCOUNTER — Other Ambulatory Visit: Payer: Medicare Other | Admitting: Lab

## 2011-07-19 ENCOUNTER — Other Ambulatory Visit: Payer: Medicare Other | Admitting: Lab

## 2011-07-19 ENCOUNTER — Ambulatory Visit: Payer: Medicare Other

## 2011-07-25 ENCOUNTER — Ambulatory Visit: Payer: Medicare Other

## 2011-07-25 ENCOUNTER — Other Ambulatory Visit: Payer: Medicare Other | Admitting: Lab

## 2011-08-06 ENCOUNTER — Telehealth: Payer: Self-pay | Admitting: Hematology & Oncology

## 2011-08-06 NOTE — Telephone Encounter (Signed)
Pt called cx 7-18 said would call back to reschedule. He said there wasn't a reason for him to come in. I left voice mail for RN to let MD know

## 2011-08-07 ENCOUNTER — Ambulatory Visit: Payer: Medicare Other | Admitting: Hematology & Oncology

## 2011-08-07 ENCOUNTER — Ambulatory Visit: Payer: Medicare Other

## 2011-08-07 ENCOUNTER — Other Ambulatory Visit: Payer: Medicare Other | Admitting: Lab

## 2011-08-23 ENCOUNTER — Ambulatory Visit (INDEPENDENT_AMBULATORY_CARE_PROVIDER_SITE_OTHER): Payer: Medicare Other | Admitting: Internal Medicine

## 2011-08-23 ENCOUNTER — Encounter: Payer: Self-pay | Admitting: Internal Medicine

## 2011-08-23 ENCOUNTER — Encounter: Payer: Self-pay | Admitting: Gastroenterology

## 2011-08-23 VITALS — BP 140/86 | HR 87 | Temp 98.1°F | Wt 223.0 lb

## 2011-08-23 DIAGNOSIS — R0989 Other specified symptoms and signs involving the circulatory and respiratory systems: Secondary | ICD-10-CM

## 2011-08-23 DIAGNOSIS — R5383 Other fatigue: Secondary | ICD-10-CM

## 2011-08-23 DIAGNOSIS — C9000 Multiple myeloma not having achieved remission: Secondary | ICD-10-CM

## 2011-08-23 DIAGNOSIS — I10 Essential (primary) hypertension: Secondary | ICD-10-CM

## 2011-08-23 DIAGNOSIS — E291 Testicular hypofunction: Secondary | ICD-10-CM

## 2011-08-23 DIAGNOSIS — E349 Endocrine disorder, unspecified: Secondary | ICD-10-CM

## 2011-08-23 DIAGNOSIS — K2270 Barrett's esophagus without dysplasia: Secondary | ICD-10-CM

## 2011-08-23 DIAGNOSIS — I2510 Atherosclerotic heart disease of native coronary artery without angina pectoris: Secondary | ICD-10-CM

## 2011-08-23 DIAGNOSIS — Z125 Encounter for screening for malignant neoplasm of prostate: Secondary | ICD-10-CM

## 2011-08-23 DIAGNOSIS — Z0000 Encounter for general adult medical examination without abnormal findings: Secondary | ICD-10-CM

## 2011-08-23 LAB — TSH: TSH: 1.71 u[IU]/mL (ref 0.35–5.50)

## 2011-08-23 LAB — LIPID PANEL
Cholesterol: 147 mg/dL (ref 0–200)
HDL: 46.3 mg/dL
Total CHOL/HDL Ratio: 3
Triglycerides: 203 mg/dL — ABNORMAL HIGH (ref 0.0–149.0)
VLDL: 40.6 mg/dL — ABNORMAL HIGH (ref 0.0–40.0)

## 2011-08-23 LAB — PSA: PSA: 1.03 ng/mL (ref 0.10–4.00)

## 2011-08-23 LAB — LDL CHOLESTEROL, DIRECT: Direct LDL: 67.4 mg/dL

## 2011-08-23 MED ORDER — SIMVASTATIN 80 MG PO TABS: 40.0000 mg | ORAL_TABLET | Freq: Every day | ORAL | Status: DC

## 2011-08-23 MED ORDER — RANITIDINE HCL 300 MG PO CAPS: 300.0000 mg | ORAL_CAPSULE | Freq: Every evening | ORAL | Status: DC

## 2011-08-23 MED ORDER — METOPROLOL TARTRATE 100 MG PO TABS: 50.0000 mg | ORAL_TABLET | Freq: Two times a day (BID) | ORAL | Status: DC

## 2011-08-23 NOTE — Assessment & Plan Note (Signed)
Carotid ultrasound 0-39% bilaterally 03-2009.

## 2011-08-23 NOTE — Assessment & Plan Note (Signed)
Got tired of fighting with his insurance about coverage, at this point is not interested in pursue treatment

## 2011-08-23 NOTE — Assessment & Plan Note (Signed)
Much improved since he discontinue chemotherapy for multiple myeloma

## 2011-08-23 NOTE — Assessment & Plan Note (Addendum)
Td 2002 and today pneumonia shot 2008 and -per pt- before stem cell transplant 2011  No shingles shot due to  MM PSAs had been wnl, we discuss screening /no screening (since he probably won't accept treatment) , he is interested on a PSA  had a colonoscopy in 2004.  The found diverticuli.  No polyps.  NEXT Cscope 2014  diet  and exercise discussed

## 2011-08-23 NOTE — Assessment & Plan Note (Addendum)
Diagnosed in 2011, s/t stem transplant November 11-12,  dz resurface April 2012 got chemotherapy for a while but had a lot of side effects, mainly fatigue. At this point, the patient is receiving no treatment. His quality of life was extremely poor during chemotherapy and he may the informed decision to withhold all treatment. Feeling much better

## 2011-08-23 NOTE — Assessment & Plan Note (Addendum)
Asymptomatic, on ranitidine Was due  for EGD June 2011 Agreed to a referral to GI

## 2011-08-23 NOTE — Patient Instructions (Addendum)
Come back in 6-8 months for a checkup 

## 2011-08-23 NOTE — Assessment & Plan Note (Signed)
Well controlled, no change, recent BMP normal 

## 2011-08-23 NOTE — Progress Notes (Signed)
  Subjective:    Patient ID: FABIEN TRAVELSTEAD, male    DOB: 06/11/1940, 71 y.o.   MRN: [id number]  HPI Here for Medicare AWV: 1. Risk factors based on Past M, S, F history: reviewed 2. Physical Activities: less active than before, some yard work 3. Depression/mood:  No problems noted or reported   4. Hearing: slt decreased over time    5. ADL's:  Independent   6. Fall Risk: no recent problems   7. home Safety: does feelsafe at home   8. Height, weight, &visual acuity: see VS, uses glasses , sees eye doctor regularly 9. Counseling: provided 10. Labs ordered based on risk factors: if needed   11. Referral Coordination: if needed 12.  Care Plan, see assessment and plan   13.   Cognitive Assessment: Motor skills and cognition within normal  In addition, today we discussed the following: MM-- of chemo, see a/p Hypertension, good medication compliance, "BP is always less than 140, last time I checked it was 120/70 " High cholesterol, good medication compliance.  Past Medical History: Multiple Myeloma---dx 03-2009 Hyperlipidemia Hypertension DJD, back pain CV: Coronary artery disease - cath 1998 - V dz - for medical management Carotid u/s 0-49% B 01/2004----------------u/s neg. 03-2009 arterial doppler <50% R vessel dz 03/2005 cardiolite neg 03/2005 GI: EGD 06-2008: Barrett's, Ulcer, duodenitis   Barretts Esophagus GERD  Past Surgical History: Rotator cuff repair   knee surgery R 1990s Spinal fusion (Neck) Lumbar fusion BM Bx 3-11  Family History: Father: prostate ca? kidney ca? colon ca-- no Mother:leukemia Siblings: bro - PE GF - stroke    Social History: Married, 4 children Patient has never smoked.   Alcohol Use - rarely  Illicit Drug Use - no Occupation: Retired   Review of Systems No chest pain or shortness of breath; no claudication No nausea, vomiting, diarrhea. No dysuria gross hematuria.     Objective:   Physical Exam General -- alert, well-developed.  No apparent distress.  Neck --no thyromegaly  Lungs -- normal respiratory effort, no intercostal retractions, no accessory muscle use, and normal breath sounds.   Heart-- normal rate, regular rhythm, no murmur, and no gallop.   Abdomen--soft, non-tender, no distention, no masses, no HSM, no guarding, and no rigidity.   Extremities-- trace pretibial edema bilaterally  Neurologic-- alert & oriented X3 and strength normal in all extremities. Psych-- Cognition and judgment appear intact. Alert and cooperative with normal attention span and concentration.  not anxious appearing and not depressed appearing.      Assessment & Plan:  Today , I spent more 25 than  min with the patient (in addition to his CPX) , >50% of the time counseling about his decision of withholding chemotherapy.

## 2011-08-23 NOTE — Assessment & Plan Note (Signed)
Asymptomatic.  Plan-- control his cardiovascular risk factors.

## 2011-08-27 ENCOUNTER — Encounter: Payer: Self-pay | Admitting: *Deleted

## 2011-08-28 ENCOUNTER — Other Ambulatory Visit: Payer: Medicare Other | Admitting: Lab

## 2011-08-28 ENCOUNTER — Ambulatory Visit: Payer: Medicare Other

## 2011-09-28 ENCOUNTER — Other Ambulatory Visit: Payer: Self-pay | Admitting: *Deleted

## 2011-09-28 MED ORDER — RANITIDINE HCL 300 MG PO CAPS: 300.0000 mg | ORAL_CAPSULE | Freq: Every evening | ORAL | Status: DC

## 2011-09-28 NOTE — Telephone Encounter (Signed)
Pt called stating that [facility] did not receive the year supply for rantidine that we sent in on 8.1.13. Pt is requesting to re-sent rx.

## 2011-10-08 ENCOUNTER — Encounter: Payer: Self-pay | Admitting: Gastroenterology

## 2011-10-08 ENCOUNTER — Ambulatory Visit (INDEPENDENT_AMBULATORY_CARE_PROVIDER_SITE_OTHER): Payer: Medicare Other | Admitting: Gastroenterology

## 2011-10-08 VITALS — BP 140/70 | HR 72 | Ht 66.0 in | Wt 220.2 lb

## 2011-10-08 DIAGNOSIS — Z9189 Other specified personal risk factors, not elsewhere classified: Secondary | ICD-10-CM

## 2011-10-08 DIAGNOSIS — K2270 Barrett's esophagus without dysplasia: Secondary | ICD-10-CM

## 2011-10-08 NOTE — Assessment & Plan Note (Signed)
Plan followup endoscopy 

## 2011-10-08 NOTE — Patient Instructions (Addendum)
Your Endoscopy is scheduled on 10/11/2011 at 10am Separate instructions have been given

## 2011-10-08 NOTE — Assessment & Plan Note (Signed)
Plan followup colonoscopy 2014 

## 2011-10-08 NOTE — Progress Notes (Signed)
History of Present Illness: Pleasant 71 year old white male with history of Barrett's esophagus referred at the request of Dr. Drue Novel for followup endoscopy. Last exam was 2010 where Barrett's was diagnosed. He has no GI complaints including change of bowel habits, abdominal pain, dysphagia or hematochezia. Colonoscopy in 2004 demonstrated diverticulosis.  The patient is undergoing therapy for multiple myeloma.    Past Medical History  Diagnosis Date  . GERD (gastroesophageal reflux disease)   . Arthritis   . Multiple myeloma     dx 03-2009  . HYPERTENSION 05/03/2006  . CORONARY ARTERY DISEASE 05/03/2006    cath 1998, medical managment, cardiolite neg 3-07  . 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 HYPERLIPIDEMIA 05/03/2006  . Carotid bruit     3-11 carotid u/s was  (-)  . Hypotestosteronism 06/26/2011   Past Surgical History  Procedure Date  . Rotator cuff repair     right  . Knee surgery 1990    right  . Spinal fusion   . Cervical fusion    family history includes Heart attack in his maternal grandfather; Kidney cancer in his father; Leukemia in his mother; Prostate cancer in his father; Pulmonary embolism in his brother; and Stroke in his maternal grandmother and paternal grandmother. Current Outpatient Prescriptions  Medication Sig Dispense Refill  . aspirin 325 MG tablet Take 325 mg by mouth daily.      Marland Kitchen b complex vitamins tablet Take 1 tablet by mouth daily.        . Cyanocobalamin (VITAMIN B 12 PO) Take 1 tablet by mouth daily.      . fluticasone (FLONASE) 50 MCG/ACT nasal spray Place 2 sprays into the nose as needed.      . metoprolol (LOPRESSOR) 100 MG tablet Take 0.5 tablets (50 mg total) by mouth 2 (two) times daily.  180 tablet  3  . Multiple Vitamin (MULTIVITAMIN) capsule Take 1 capsule by mouth daily.        . Probiotic Product (PROBIOTIC PO) Take by mouth.        . ranitidine (ZANTAC) 300  MG capsule Take 1 capsule (300 mg total) by mouth every evening.  90 capsule  3  . simvastatin (ZOCOR) 80 MG tablet Take 0.5 tablets (40 mg total) by mouth at bedtime.  90 tablet  3  . DISCONTD: fluticasone (FLONASE) 50 MCG/ACT nasal spray Place 2 sprays into the nose daily.  16 g  3   Allergies as of 10/08/2011  . (No Known Allergies)    reports that he has quit smoking. He has never used smokeless tobacco. He reports that he drinks alcohol. He reports that he does not use illicit drugs.     Review of Systems: He complains of fatigue. Pertinent positive and negative review of systems were noted in the above HPI section. All other review of systems were otherwise negative.  Vital signs were reviewed in today's medical record Physical Exam: General: Well developed , well nourished, no acute distress Head: Normocephalic and atraumatic Eyes:  sclerae anicteric, EOMI Ears: Normal auditory acuity Mouth: No deformity or lesions Neck: Supple, no masses or thyromegaly Lungs: Clear throughout to auscultation Heart: Regular rate and rhythm; no murmurs, rubs or bruits Abdomen: Soft, non tender and non distended. No masses, hepatosplenomegaly or hernias noted. Normal Bowel sounds Rectal:deferred Musculoskeletal: Symmetrical with no gross deformities  Skin: No lesions on visible extremities  Pulses:  Normal pulses noted Extremities: No clubbing, cyanosis, edema or deformities noted Neurological: Alert oriented x 4, grossly nonfocal Cervical Nodes:  No significant cervical adenopathy Inguinal Nodes: No significant inguinal adenopathy Psychological:  Alert and cooperative. Normal mood and affect       

## 2011-10-11 ENCOUNTER — Ambulatory Visit (AMBULATORY_SURGERY_CENTER): Payer: Medicare Other | Admitting: Gastroenterology

## 2011-10-11 ENCOUNTER — Encounter: Payer: Self-pay | Admitting: Gastroenterology

## 2011-10-11 VITALS — BP 132/79 | HR 71 | Temp 98.6°F | Resp 15 | Ht 66.0 in | Wt 220.0 lb

## 2011-10-11 DIAGNOSIS — D131 Benign neoplasm of stomach: Secondary | ICD-10-CM

## 2011-10-11 DIAGNOSIS — K2990 Gastroduodenitis, unspecified, without bleeding: Secondary | ICD-10-CM

## 2011-10-11 DIAGNOSIS — K2270 Barrett's esophagus without dysplasia: Secondary | ICD-10-CM

## 2011-10-11 DIAGNOSIS — K6389 Other specified diseases of intestine: Secondary | ICD-10-CM

## 2011-10-11 MED ORDER — SODIUM CHLORIDE 0.9 % IV SOLN: 500.0000 mL | INTRAVENOUS | Status: DC

## 2011-10-11 MED ORDER — PANTOPRAZOLE SODIUM 40 MG PO TBEC: 40.0000 mg | DELAYED_RELEASE_TABLET | Freq: Every day | ORAL | Status: DC

## 2011-10-11 NOTE — Progress Notes (Signed)
Patient did not experience any of the following events: a burn prior to discharge; a fall within the facility; wrong site/side/patient/procedure/implant event; or a hospital transfer or hospital admission upon discharge from the facility. (G8907) Patient did not have preoperative order for IV antibiotic SSI prophylaxis. (G8918)  

## 2011-10-11 NOTE — Patient Instructions (Addendum)
YOU HAD AN ENDOSCOPIC PROCEDURE TODAY AT THE [facility]: Refer to the procedure report that was given to you for any specific questions about what was found during the examination.  If the procedure report does not answer your questions, please call your gastroenterologist to clarify.  If you requested that your care partner not be given the details of your procedure findings, then the procedure report has been included in a sealed envelope for you to review at your convenience later.  YOU SHOULD EXPECT: Some feelings of bloating in the abdomen. Passage of more gas than usual.  Walking can help get rid of the air that was put into your GI tract during the procedure and reduce the bloating. If you had a lower endoscopy (such as a colonoscopy or flexible sigmoidoscopy) you may notice spotting of blood in your stool or on the toilet paper. If you underwent a bowel prep for your procedure, then you may not have a normal bowel movement for a few days.  DIET: Your first meal following the procedure should be a light meal and then it is ok to progress to your normal diet.  A half-sandwich or bowl of soup is an example of a good first meal.  Heavy or fried foods are harder to digest and may make you feel nauseous or bloated.  Likewise meals heavy in dairy and vegetables can cause extra gas to form and this can also increase the bloating.  Drink plenty of fluids but you should avoid alcoholic beverages for 24 hours.  ACTIVITY: Your care partner should take you home directly after the procedure.  You should plan to take it easy, moving slowly for the rest of the day.  You can resume normal activity the day after the procedure however you should NOT DRIVE or use heavy machinery for 24 hours (because of the sedation medicines used during the test).    SYMPTOMS TO REPORT IMMEDIATELY: A gastroenterologist can be reached at any hour.  During normal business hours, 8:30 AM to 5:00 PM Monday through Friday,  call [phone].  After hours and on weekends, please call the GI answering service at [phone] who will take a message and have the physician on call contact you.   Following lower endoscopy (colonoscopy or flexible sigmoidoscopy):  Excessive amounts of blood in the stool  Significant tenderness or worsening of abdominal pains  Swelling of the abdomen that is new, acute  Fever of 100F or higher  Following upper endoscopy (EGD)  Vomiting of blood or coffee ground material  New chest pain or pain under the shoulder blades  Painful or persistently difficult swallowing  New shortness of breath  Fever of 100F or higher  Black, tarry-looking stools  FOLLOW UP: If any biopsies were taken you will be contacted by phone or by letter within the next 1-3 weeks.  Call your gastroenterologist if you have not heard about the biopsies in 3 weeks.  Our staff will call the home number listed on your records the next business day following your procedure to check on you and address any questions or concerns that you may have at that time regarding the information given to you following your procedure. This is a courtesy call and so if there is no answer at the home number and we have not heard from you through the emergency physician on call, we will assume that you have returned to your regular daily activities without incident.  SIGNATURES/CONFIDENTIALITY: You and/or your care   partner have signed paperwork which will be entered into your electronic medical record.  These signatures attest to the fact that that the information above on your After Visit Summary has been reviewed and is understood.  Full responsibility of the confidentiality of this discharge information lies with you and/or your care-partner. YOU HAD AN ENDOSCOPIC PROCEDURE TODAY AT THE [facility]: Refer to the procedure report that was given to you for any specific questions about what was found during the  examination.  If the procedure report does not answer your questions, please call your gastroenterologist to clarify.  If you requested that your care partner not be given the details of your procedure findings, then the procedure report has been included in a sealed envelope for you to review at your convenience later.  YOU SHOULD EXPECT: Some feelings of bloating in the abdomen. Passage of more gas than usual.  Walking can help get rid of the air that was put into your GI tract during the procedure and reduce the bloating. If you had a lower endoscopy (such as a colonoscopy or flexible sigmoidoscopy) you may notice spotting of blood in your stool or on the toilet paper. If you underwent a bowel prep for your procedure, then you may not have a normal bowel movement for a few days.  DIET: Your first meal following the procedure should be a light meal and then it is ok to progress to your normal diet.  A half-sandwich or bowl of soup is an example of a good first meal.  Heavy or fried foods are harder to digest and may make you feel nauseous or bloated.  Likewise meals heavy in dairy and vegetables can cause extra gas to form and this can also increase the bloating.  Drink plenty of fluids but you should avoid alcoholic beverages for 24 hours.  ACTIVITY: Your care partner should take you home directly after the procedure.  You should plan to take it easy, moving slowly for the rest of the day.  You can resume normal activity the day after the procedure however you should NOT DRIVE or use heavy machinery for 24 hours (because of the sedation medicines used during the test).    SYMPTOMS TO REPORT IMMEDIATELY: A gastroenterologist can be reached at any hour.  During normal business hours, 8:30 AM to 5:00 PM Monday through Friday, call [phone].  After hours and on weekends, please call the GI answering service at [phone] who will take a message and have the physician on call contact  you.   Following lower endoscopy (colonoscopy or flexible sigmoidoscopy):  Excessive amounts of blood in the stool  Significant tenderness or worsening of abdominal pains  Swelling of the abdomen that is new, acute  Fever of 100F or higher  Following upper endoscopy (EGD)  Vomiting of blood or coffee ground material  New chest pain or pain under the shoulder blades  Painful or persistently difficult swallowing  New shortness of breath  Fever of 100F or higher  Black, tarry-looking stools  FOLLOW UP: If any biopsies were taken you will be contacted by phone or by letter within the next 1-3 weeks.  Call your gastroenterologist if you have not heard about the biopsies in 3 weeks.  Our staff will call the home number listed on your records the next business day following your procedure to check on you and address any questions or concerns that you may have at that time regarding the information given to   you following your procedure. This is a courtesy call and so if there is no answer at the home number and we have not heard from you through the emergency physician on call, we will assume that you have returned to your regular daily activities without incident.  SIGNATURES/CONFIDENTIALITY: You and/or your care partner have signed paperwork which will be entered into your electronic medical record.  These signatures attest to the fact that that the information above on your After Visit Summary has been reviewed and is understood.  Full responsibility of the confidentiality of this discharge information lies with you and/or your care-partner.  

## 2011-10-11 NOTE — Op Note (Signed)
[facility] [address] [place], [address]   ENDOSCOPY PROCEDURE REPORT  PATIENT: Gene, Hill  MR#: [id number] BIRTHDATE: 31-Jul-1940 , 71  yrs. old GENDER: Male ENDOSCOPIST: Louis Meckel, MD REFERRED BY: PROCEDURE DATE:  10/11/2011 PROCEDURE:  EGD w/ biopsy ASA CLASS:     Class II INDICATIONS:  history of Barrett's esophagus. MEDICATIONS: MAC sedation, administered by CRNA, propofol (Diprivan) 100mg  IV, and Simethicone 0.6cc PO TOPICAL ANESTHETIC: Cetacaine Spray  DESCRIPTION OF PROCEDURE: After the risks benefits and alternatives of the procedure were thoroughly explained, informed consent was obtained.  The LB [id number]-H180 [id number] endoscope was introduced through the mouth and advanced to the third portion of the duodenum. Without limitations.  The instrument was slowly withdrawn as the mucosa was fully examined.      Diffuse erythema with several superficial erosions in body and antrum.  Biopsies were taken.   Diffuse erythema with several superficial erosions in body and antrum.  Biopsies were taken. The remainder of the upper endoscopy exam was otherwise normal.  ESOPHAGUS: There was evidence of Barrett's esophagus at the gastroesophageal junction. Short segment Barrett's with slightly irregular GE junction.  Multiple biopsies were performed. The scope was then withdrawn from the patient and the procedure completed.  COMPLICATIONS: There were no complications. ENDOSCOPIC IMPRESSION: 1.   Erosive Gastritis. 2.   The remainder of the upper endoscopy exam was otherwise normal 3.   There was evidence of Barrett's esophagus;  RECOMMENDATIONS: await biopsy results begin protonix  REPEAT EXAM:  eSigned:  Louis Meckel, MD 10/11/2011 10:10 AM   XB:JYNW Drue Novel, MD  PATIENT NAME:  Gene, Hill MR#: [id number]

## 2011-10-12 ENCOUNTER — Telehealth: Payer: Self-pay | Admitting: *Deleted

## 2011-10-12 NOTE — Telephone Encounter (Signed)
  Follow up Call-  Call back number 10/11/2011  Post procedure Call Back phone  # [phone]  Permission to leave phone message Yes     Patient questions:  Do you have a fever, pain , or abdominal swelling? no Pain Score  0 *  Have you tolerated food without any problems? yes  Have you been able to return to your normal activities? yes  Do you have any questions about your discharge instructions: Diet   no Medications  no Follow up visit  no  Do you have questions or concerns about your Care? no  Actions: * If pain score is 4 or above: No action needed, pain <4.

## 2011-10-17 ENCOUNTER — Encounter: Payer: Self-pay | Admitting: Gastroenterology

## 2011-10-25 ENCOUNTER — Encounter: Payer: Self-pay | Admitting: *Deleted

## 2011-11-13 ENCOUNTER — Ambulatory Visit (INDEPENDENT_AMBULATORY_CARE_PROVIDER_SITE_OTHER): Payer: Medicare Other

## 2011-11-13 ENCOUNTER — Telehealth: Payer: Self-pay | Admitting: Internal Medicine

## 2011-11-13 DIAGNOSIS — Z23 Encounter for immunization: Secondary | ICD-10-CM

## 2011-11-13 NOTE — Telephone Encounter (Signed)
pt wants shingles shot but has some questions since he has been getting some type of medication at Oncology   Call back # [phone]

## 2011-11-13 NOTE — Telephone Encounter (Signed)
Left msg for pt to return call.

## 2011-11-14 NOTE — Telephone Encounter (Signed)
Pt asked is it okay to have shingles shot? Pt states was on famvir finished in June was advised this med is suppose to prevent shingles and was told they didn't want to over dose him.  PLz advise     MW

## 2011-11-14 NOTE — Telephone Encounter (Signed)
Discussed with pt

## 2011-11-14 NOTE — Telephone Encounter (Signed)
No shingles shot recommended unless cleared by oncology, rec to discuss the issue w/ them on next OV

## 2012-03-25 ENCOUNTER — Ambulatory Visit: Payer: Medicare Other | Admitting: Internal Medicine

## 2012-04-28 ENCOUNTER — Telehealth: Payer: Self-pay | Admitting: Hematology & Oncology

## 2012-04-28 NOTE — Telephone Encounter (Signed)
Per wife scheduled 8-4 appointment. She said the 2 MD's are seeing pt alternating appointments.

## 2012-07-14 ENCOUNTER — Encounter: Payer: Self-pay | Admitting: Gastroenterology

## 2012-08-25 ENCOUNTER — Ambulatory Visit (HOSPITAL_BASED_OUTPATIENT_CLINIC_OR_DEPARTMENT_OTHER): Payer: Medicare Other | Admitting: Lab

## 2012-08-25 ENCOUNTER — Ambulatory Visit (HOSPITAL_BASED_OUTPATIENT_CLINIC_OR_DEPARTMENT_OTHER): Payer: Medicare Other | Admitting: Hematology & Oncology

## 2012-08-25 VITALS — BP 137/81 | HR 74 | Temp 98.6°F | Ht 67.0 in | Wt 222.0 lb

## 2012-08-25 DIAGNOSIS — C9000 Multiple myeloma not having achieved remission: Secondary | ICD-10-CM

## 2012-08-25 DIAGNOSIS — E349 Endocrine disorder, unspecified: Secondary | ICD-10-CM

## 2012-08-25 DIAGNOSIS — C9001 Multiple myeloma in remission: Secondary | ICD-10-CM

## 2012-08-25 DIAGNOSIS — E291 Testicular hypofunction: Secondary | ICD-10-CM

## 2012-08-25 DIAGNOSIS — R5383 Other fatigue: Secondary | ICD-10-CM

## 2012-08-25 DIAGNOSIS — Z9484 Stem cells transplant status: Secondary | ICD-10-CM

## 2012-08-25 DIAGNOSIS — R5381 Other malaise: Secondary | ICD-10-CM

## 2012-08-25 LAB — CBC WITH DIFFERENTIAL (CANCER CENTER ONLY)
BASO#: 0 10*3/uL (ref 0.0–0.2)
EOS%: 1.8 % (ref 0.0–7.0)
Eosinophils Absolute: 0.1 10*3/uL (ref 0.0–0.5)
HCT: 39.6 % (ref 38.7–49.9)
HGB: 13.1 g/dL (ref 13.0–17.1)
LYMPH#: 1.4 10*3/uL (ref 0.9–3.3)
MCHC: 33.1 g/dL (ref 32.0–35.9)
MONO#: 0.8 10*3/uL (ref 0.1–0.9)
NEUT#: 2.6 10*3/uL (ref 1.5–6.5)
NEUT%: 53.3 % (ref 40.0–80.0)
RBC: 3.89 10*6/uL — ABNORMAL LOW (ref 4.20–5.70)

## 2012-08-25 NOTE — Progress Notes (Signed)
This office note has been dictated.

## 2012-08-26 LAB — COMPREHENSIVE METABOLIC PANEL
Albumin: 4.3 g/dL (ref 3.5–5.2)
BUN: 14 mg/dL (ref 6–23)
CO2: 30 mEq/L (ref 19–32)
Calcium: 9.9 mg/dL (ref 8.4–10.5)
Chloride: 102 mEq/L (ref 96–112)
Creatinine, Ser: 0.96 mg/dL (ref 0.50–1.35)
Glucose, Bld: 85 mg/dL (ref 70–99)
Potassium: 4.7 mEq/L (ref 3.5–5.3)

## 2012-08-26 LAB — IGG, IGA, IGM
IgA: 520 mg/dL — ABNORMAL HIGH (ref 68–379)
IgG (Immunoglobin G), Serum: 625 mg/dL — ABNORMAL LOW (ref 650–1600)
IgM, Serum: 28 mg/dL — ABNORMAL LOW (ref 41–251)

## 2012-08-26 LAB — TESTOSTERONE: Testosterone: 172 ng/dL — ABNORMAL LOW (ref 300–890)

## 2012-08-26 LAB — KAPPA/LAMBDA LIGHT CHAINS: Kappa free light chain: 1.47 mg/dL (ref 0.33–1.94)

## 2012-08-26 NOTE — Progress Notes (Signed)
CC:   Gene Ora, MD Verita Schneiders, MD  DIAGNOSIS:  IgA kappa myeloma.  CURRENT THERAPY:  Observation.  INTERIM HISTORY:  Gene Hill comes in for followup.  We have actually not seen him now for a little over a year.  There were some financial issues that were making it difficult for him to come back.  These are all settled now.  He actually saw Dr. Jacqulyn Hill at [facility] back in April.  [name] was only 0.17 g/dL.  His kappa light chain was 0.97 mg/dL.  IgA level was 375 mg/dL.  This is probably the best I have seen him look in awhile.  He feels well.  He has some days in which he does feel a little tired. His garden, unfortunately, has not been all that great this year because of lack of rain.  I am glad that he is enjoying himself.  When we last saw him, he was feeling quite lethargic.  He had low testosterone.  We gave him some Depo-Testosterone in the office, which hopefully help a little bit.  He has not noted any problems with fevers, sweats or chills.  He has actually had no change in bowel or bladder habits.  He has had no leg swelling.  There have been no rashes.  Overall, his performance status is ECOG 1.  PHYSICAL EXAMINATION:  General:  This is a well-developed, well- nourished white gentleman in no obvious distress.  Vital signs: Temperature of 98.6, pulse 74, respiratory rate 24, blood pressure 137/81.  Weight is 222.  Head and neck:  Normocephalic, atraumatic skull.  There are no ocular or oral lesions.  There are no palpable cervical or supraclavicular lymph nodes.  Lungs:  Clear bilaterally. Cardiac:  Regular rate and rhythm with a normal S1, S2.  There are no murmurs, rubs or bruits.  Abdomen:  Soft.  He has good bowel sounds. There is no palpable abdominal mass.  There is no palpable hepatosplenomegaly.  Extremities:  Show no clubbing, cyanosis or edema. Neurological:  Shows no focal neurological deficits.  LABORATORY STUDIES:  White cell count is 4.9,  hemoglobin 13.1, hematocrit 39.6, platelet count 149.  IMPRESSION:  Gene Hill is a nice 72 year old white gentleman.  He had IgA kappa myeloma.  He actually had bad cytogenetics with a 17p abnormality.  Surprisingly enough, he went into remission very quickly. 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  We had him on some maintenance therapy which he had a hard time with, so we finally just got him off all treatment.  His quality of life is clearly a whole lot better now.  So far, there has been no evidence of myeloma progression at all.  He is going to alternate between [place] and [facility] every 4 months.  As such, we will see him back in 8 months.  I am just happy that his quality of life is much better now.    ______________________________ Gene Hill, M.D. PRE/MEDQ  D:  08/25/2012  T:  08/26/2012  Job:  [id number]

## 2012-08-27 ENCOUNTER — Encounter: Payer: Self-pay | Admitting: Internal Medicine

## 2012-08-27 ENCOUNTER — Ambulatory Visit (INDEPENDENT_AMBULATORY_CARE_PROVIDER_SITE_OTHER): Payer: Medicare Other | Admitting: Internal Medicine

## 2012-08-27 VITALS — BP 130/70 | HR 80 | Temp 98.1°F | Ht 67.0 in | Wt 223.0 lb

## 2012-08-27 DIAGNOSIS — E785 Hyperlipidemia, unspecified: Secondary | ICD-10-CM

## 2012-08-27 DIAGNOSIS — K2270 Barrett's esophagus without dysplasia: Secondary | ICD-10-CM

## 2012-08-27 DIAGNOSIS — E291 Testicular hypofunction: Secondary | ICD-10-CM

## 2012-08-27 DIAGNOSIS — E349 Endocrine disorder, unspecified: Secondary | ICD-10-CM

## 2012-08-27 DIAGNOSIS — Z0000 Encounter for general adult medical examination without abnormal findings: Secondary | ICD-10-CM

## 2012-08-27 DIAGNOSIS — I10 Essential (primary) hypertension: Secondary | ICD-10-CM

## 2012-08-27 DIAGNOSIS — I2510 Atherosclerotic heart disease of native coronary artery without angina pectoris: Secondary | ICD-10-CM

## 2012-08-27 LAB — LIPID PANEL
Cholesterol: 167 mg/dL (ref 0–200)
HDL: 42.4 mg/dL (ref >39.00)
Total CHOL/HDL Ratio: 4
Triglycerides: 310 mg/dL — ABNORMAL HIGH (ref 0.0–149.0)
VLDL: 62 mg/dL — ABNORMAL HIGH (ref 0.0–40.0)

## 2012-08-27 MED ORDER — RANITIDINE HCL 300 MG PO TABS: 300.0000 mg | ORAL_TABLET | Freq: Every day | ORAL | Status: DC

## 2012-08-27 MED ORDER — SIMVASTATIN 40 MG PO TABS: 40.0000 mg | ORAL_TABLET | Freq: Every day | ORAL | Status: DC

## 2012-08-27 MED ORDER — METOPROLOL TARTRATE 100 MG PO TABS: 50.0000 mg | ORAL_TABLET | Freq: Two times a day (BID) | ORAL | Status: DC

## 2012-08-27 NOTE — Assessment & Plan Note (Addendum)
Status post EGD 09-2011, found to have erosive gastritis. Biopsy biopsy showed no malignancy. Plan is to repeat EGD in 3 years. Currently patient only on Zantac and asymptomatic. Will communicate with GI: Protonix? Addendum: Spoke with GI, they do rec  Protonix over Zantac. Will call patient, d/c Zantac, stay on protonix

## 2012-08-27 NOTE — Assessment & Plan Note (Addendum)
History of testosterone, I don't see a FSH, LH or prolactin levels In the chart. Never saw a endocrinologist, currently feels well, good energy, does not desire treatment. Recommend a bone density test

## 2012-08-27 NOTE — Patient Instructions (Signed)

## 2012-08-27 NOTE — Assessment & Plan Note (Signed)
Well controlled 

## 2012-08-27 NOTE — Assessment & Plan Note (Signed)
Asymptomatic, controlling CV RF

## 2012-08-27 NOTE — Assessment & Plan Note (Signed)
Td 2013 pneumonia shot 2008 and -per pt- before stem cell transplant 2011  No shingles shot due to  MM DRE (-) today  PSA consistently normal, last PSA 2013, plan-- revisit issue in 2016 had a colonoscopy in 2004.  The found diverticuli.  No polyps.  Due for a Cscope 2014 , pt does not like to proceed, hemocult neg today, provided iFOB diet  and exercise discussed

## 2012-08-27 NOTE — Assessment & Plan Note (Signed)
Labs , good compliance with meds

## 2012-08-27 NOTE — Progress Notes (Signed)
Subjective:    Patient ID: Gene Hill, male    DOB: [birth year], 72 y.o.   MRN: [id number]  HPI  Here for Medicare AWV:  1. Risk factors based on Past M, S, F history: reviewed  2. Physical Activities: active,  garden work  3. Depression/mood: No problems noted or reported  4. Hearing: slt decreased over time, occ tinnitus x years. Counseled, see an audiologist? Pt not sure as he won't use aids  5. ADL's: Independent  6. Fall Risk: trip, almost fall x 1 this year, precautions discussed 7. home Safety: does feel safe at home  8. Height, weight, &visual acuity: see VS, uses glasses  9. Counseling: provided  10. Labs ordered based on risk factors: if needed  11. Referral Coordination: if needed  12. Care Plan, see assessment and plan  13. Cognitive Assessment: Motor skills and cognition within normal   In addition, today we discussed the following: GERD, Barrett's esophagus, had a EGD last year, see assessment and plan. Currently on Zantac. Hypertension, good medication compliance,BP today. High cholesterol, good compliance with statins, no apparent side effects. Multiple myeloma, chart reviewed, recently seen by oncology and doing great   Past Medical History  Diagnosis Date  . GERD (gastroesophageal reflux disease)   . Arthritis   . 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 HYPERTENSION 05/03/2006  . CORONARY ARTERY DISEASE 05/03/2006    cath 1998, medical managment, cardiolite neg 3-07  . 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 HYPERLIPIDEMIA 05/03/2006  . Carotid bruit     3-11 carotid u/s was  (-)  . 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 Blood transfusion    Past Surgical History  Procedure Laterality Date  . Rotator cuff repair      right  . Knee surgery  1990    right  . Spinal fusion    . Cervical fusion     History   Social History  . Marital Status: Married     Spouse Name: N/A    Number of Children: 4  . Years of Education: N/A   Occupational History  . retired    Social History Main Topics  . Smoking status: Former Games developer  . Smokeless tobacco: Never Used     Comment: quit 1974, light smoker  . Alcohol Use: Yes     Comment: less than 1 per day   . Drug Use: No  . Sexually Active: Not on file   Other Topics Concern  . Not on file   Social History Narrative  . No narrative on file   Family History: Father: prostate ca? kidney ca? colon ca-- no Mother:leukemia Siblings: bro - PE GF - stroke    Review of Systems Exercise-she remains active. No chest pain or shortness or breath No nausea, vomiting, diarrhea or blood in the stools. No dysuria gross motor, no difficulty urinating. GERD symptoms well controlled, no heartburn. Denies dysphagia or odynophagia    Objective:   Physical Exam BP 130/70  Pulse 80  Temp(Src) 98.1 F (36.7 C) (Oral)  Ht 5\' 7"  (1.702 m)  Wt 223 lb (101.152 kg)  BMI 34.92 kg/m2  SpO2 94% General -- alert, well-developed,NAD Neck --no thyromegaly , normal carotid pulse Lungs -- normal respiratory effort, no intercostal retractions, no accessory muscle use, and normal breath sounds.   Heart-- normal  rate, regular rhythm, no murmur, and no gallop.   Abdomen--soft, non-tender, no distention, no masses, no bruit.   Extremities-- R calf larger than L , no edema or tenderness (has L leg muscle atrophy, chronic since a injury per pt) Rectal-- No external abnormalities noted. Normal sphincter tone. No rectal masses or tenderness. Brown stool, Hemoccult negative Prostate:  Prostate gland firm and smooth, no enlargement, nodularity, tenderness, mass, asymmetry or induration. Neurologic-- alert & oriented X3 and strength normal in all extremities. Psych-- Cognition and judgment appear intact. Alert and cooperative with normal attention span and concentration.  not anxious appearing and not depressed appearing.         Assessment & Plan:

## 2012-08-28 ENCOUNTER — Encounter: Payer: Self-pay | Admitting: Internal Medicine

## 2012-09-01 ENCOUNTER — Telehealth: Payer: Self-pay | Admitting: Oncology

## 2012-09-01 ENCOUNTER — Encounter: Payer: Self-pay | Admitting: *Deleted

## 2012-09-01 NOTE — Telephone Encounter (Signed)
Message copied by Lacie Draft on Mon Sep 01, 2012  4:22 PM ------      Message from: Arlan Organ R      Created: Wed Aug 27, 2012  4:28 PM       Please call and let him know that the myeloma tests still looked pretty stable. His testosterone level is low but he is not bothered by this. Thanks. Pete ------

## 2012-09-03 ENCOUNTER — Telehealth: Payer: Self-pay | Admitting: Oncology

## 2012-09-03 NOTE — Telephone Encounter (Addendum)
Message copied by Lacie Draft on Wed Sep 03, 2012 10:22 AM ------      Message from: Arlan Organ R      Created: Wed Aug 27, 2012  4:28 PM       Please call and let him know that the myeloma tests still looked pretty stable. His testosterone level is low but he is not bothered by this. Thanks. Pete ------Spoke with patient's wife.

## 2012-09-05 ENCOUNTER — Telehealth: Payer: Self-pay | Admitting: *Deleted

## 2012-09-05 MED ORDER — PANTOPRAZOLE SODIUM 40 MG PO TBEC: 40.0000 mg | DELAYED_RELEASE_TABLET | Freq: Every day | ORAL | Status: DC

## 2012-09-05 NOTE — Telephone Encounter (Signed)
Message copied by Shirlee More I on Fri Sep 05, 2012 10:59 AM ------      Message from: Gene Hill      Created: Thu Sep 04, 2012  2:43 PM       Advise patient, I spoke with GI ref Barret's esophagus:       they recommend   Protonix over Zantac.       Recommend to , d/c Zantac, stay on protonix, call rx x 1 year if needed  ------

## 2012-09-05 NOTE — Telephone Encounter (Signed)
Discussed with patient wife Hilda Lias per DPR, verbalized understanding. rx sent to [facility] per request for one year supply per orders.

## 2012-09-05 NOTE — Telephone Encounter (Signed)
lmovm to return call  °

## 2012-09-09 ENCOUNTER — Other Ambulatory Visit (INDEPENDENT_AMBULATORY_CARE_PROVIDER_SITE_OTHER): Payer: Medicare Other

## 2012-09-09 DIAGNOSIS — Z0000 Encounter for general adult medical examination without abnormal findings: Secondary | ICD-10-CM

## 2012-09-09 LAB — FECAL OCCULT BLOOD, IMMUNOCHEMICAL: Fecal Occult Bld: POSITIVE

## 2012-10-01 ENCOUNTER — Ambulatory Visit (INDEPENDENT_AMBULATORY_CARE_PROVIDER_SITE_OTHER): Payer: Medicare Other | Admitting: Emergency Medicine

## 2012-10-01 VITALS — BP 132/76 | HR 81 | Temp 98.7°F | Resp 20 | Ht 67.0 in | Wt 222.0 lb

## 2012-10-01 DIAGNOSIS — M549 Dorsalgia, unspecified: Secondary | ICD-10-CM

## 2012-10-01 DIAGNOSIS — L02212 Cutaneous abscess of back [any part, except buttock]: Secondary | ICD-10-CM

## 2012-10-01 DIAGNOSIS — L02219 Cutaneous abscess of trunk, unspecified: Secondary | ICD-10-CM

## 2012-10-01 MED ORDER — HYDROCODONE-ACETAMINOPHEN 5-325 MG PO TABS: 1.0000 | ORAL_TABLET | ORAL | Status: DC | PRN

## 2012-10-01 MED ORDER — SULFAMETHOXAZOLE-TRIMETHOPRIM 800-160 MG PO TABS: 1.0000 | ORAL_TABLET | Freq: Two times a day (BID) | ORAL | Status: DC

## 2012-10-01 NOTE — Progress Notes (Signed)
[facility] [address] [place] [address] [phone]  Date:  10/01/2012   Name:  Gene Hill   DOB:  1940/07/22   MRN:  [id number]  PCP:  Willow Ora, MD    Chief Complaint: Boil on back   History of Present Illness:  Gene Hill is a 72 y.o. very pleasant male patient who presents with the following:  Noticed a sore spot on his low back Friday. By Sunday it had become an abscess.  No history of injury.  No fever or chills.  No improvement with over the counter medications or other home remedies. Denies other complaint or health concern today.   Patient Active Problem List   Diagnosis Date Noted  . 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 Medicare annual wellness visit, subsequent 08/14/2010  . MULTIPLE  MYELOMA 04/15/2009  . CAROTID BRUIT 04/14/2009  . 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 DIVERTICULOSIS, COLON 08/13/2008  . BACK PAIN 07/22/2006  . FATIGUE 07/22/2006  . HYPERLIPIDEMIA 05/03/2006  . HYPERTENSION 05/03/2006  . CORONARY ARTERY DISEASE 05/03/2006  . COLONOSCOPY WITH BIOPSY, HX OF 09/22/2002    Past Medical History  Diagnosis Date  . GERD (gastroesophageal reflux disease)   . Arthritis   . 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 HYPERTENSION 05/03/2006  . CORONARY ARTERY DISEASE 05/03/2006    cath 1998, medical managment, cardiolite neg 3-07  . 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 HYPERLIPIDEMIA 05/03/2006  . Carotid bruit     3-11 carotid u/s was  (-)  . 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 Blood transfusion     Past Surgical History  Procedure Laterality Date  . Rotator cuff repair      right  . Knee surgery  1990    right  . Spinal fusion    . Cervical fusion      History  Substance Use Topics  . Smoking status: Former Games developer  . Smokeless tobacco: Never Used     Comment: quit 1974, light smoker  . Alcohol Use: Yes      Comment: less than 1 per day     Family History  Problem Relation Age of Onset  . Prostate cancer Father   . Kidney cancer Father   . Leukemia Mother   . Pulmonary embolism Brother   . Stroke Maternal Grandmother   . Heart attack Maternal Grandfather   . Stroke Paternal Grandmother   . Colon cancer Neg Hx   . Esophageal cancer Neg Hx   . Rectal cancer Neg Hx   . Stomach cancer Neg Hx   . Cerebral palsy Son     No Known Allergies  Medication list has been reviewed and updated.  Current Outpatient Prescriptions on File Prior to Visit  Medication Sig Dispense Refill  . Acetaminophen (TYLENOL PO) Take by mouth. Takes 2 tablets at supper and at bedtime      . aspirin EC 325 MG tablet Take by mouth. Take 325 mg by mouth daily.      Marland Kitchen b complex vitamins capsule Take by mouth. Take by mouth.      . Cyanocobalamin (RA VITAMIN B-12 TR) 1000 MCG TBCR Take by mouth. Take 1,000 mcg by mouth daily.      . fluticasone (FLONASE) 50 MCG/ACT nasal spray Place 2 sprays into the nose as  needed.      . metoprolol (LOPRESSOR) 100 MG tablet Take 0.5 tablets (50 mg total) by mouth 2 (two) times daily.  90 tablet  3  . Multiple Vitamin (MULTIVITAMIN) capsule Take 1 capsule by mouth daily.        . Naproxen Sodium (ALEVE PO) Take 1 tablet by mouth daily.      . pantoprazole (PROTONIX) 40 MG tablet Take 1 tablet (40 mg total) by mouth daily.  90 tablet  3  . Probiotic Product (PROBIOTIC PO) Take by mouth.        . simvastatin (ZOCOR) 40 MG tablet Take 1 tablet (40 mg total) by mouth at bedtime. Take 40 mg by mouth nightly.  90 tablet  3   No current facility-administered medications on file prior to visit.    Review of Systems:  As per HPI, otherwise negative.    Physical Examination: Filed Vitals:   10/01/12 0834  BP: 132/76  Pulse: 81  Temp: 98.7 F (37.1 C)  Resp: 20   Filed Vitals:   10/01/12 0834  Height: 5\' 7"  (1.702 m)  Weight: 222 lb (100.699 kg)   Body mass index is  34.76 kg/(m^2). Ideal Body Weight: Weight in (lb) to have BMI = 25: 159.3   GEN: WDWN, NAD, Non-toxic, Alert & Oriented x 3 HEENT: Atraumatic, Normocephalic.  Ears and Nose: No external deformity. EXTR: No clubbing/cyanosis/edema NEURO: Normal gait.  PSYCH: Normally interactive. Conversant. Not depressed or anxious appearing.  Calm demeanor.  BACK:  Furuncle on back around L2   Assessment and Plan: Abscess back I&D Septra Follow up here in 2 days   Signed,  Phillips Odor, MD

## 2012-10-01 NOTE — Patient Instructions (Addendum)
Abscess An abscess is an infected area that contains a collection of pus and debris.It can occur in almost any part of the body. An abscess is also known as a furuncle or boil. CAUSES  An abscess occurs when tissue gets infected. This can occur from blockage of oil or sweat glands, infection of hair follicles, or a minor injury to the skin. As the body tries to fight the infection, pus collects in the area and creates pressure under the skin. This pressure causes pain. People with weakened immune systems have difficulty fighting infections and get certain abscesses more often.  SYMPTOMS Usually an abscess develops on the skin and becomes a painful mass that is red, warm, and tender. If the abscess forms under the skin, you may feel a moveable soft area under the skin. Some abscesses break open (rupture) on their own, but most will continue to get worse without care. The infection can spread deeper into the body and eventually into the bloodstream, causing you to feel ill.  DIAGNOSIS  Your caregiver will take your medical history and perform a physical exam. A sample of fluid may also be taken from the abscess to determine what is causing your infection. TREATMENT  Your caregiver may prescribe antibiotic medicines to fight the infection. However, taking antibiotics alone usually does not cure an abscess. Your caregiver may need to make a small cut (incision) in the abscess to drain the pus. In some cases, gauze is packed into the abscess to reduce pain and to continue draining the area. HOME CARE INSTRUCTIONS   Only take over-the-counter or prescription medicines for pain, discomfort, or fever as directed by your caregiver.  If you were prescribed antibiotics, take them as directed. Finish them even if you start to feel better.  If gauze is used, follow your caregiver's directions for changing the gauze.  To avoid spreading the infection:  Keep your draining abscess covered with a  bandage.  Wash your hands well.  Do not share personal care items, towels, or whirlpools with others.  Avoid skin contact with others.  Keep your skin and clothes clean around the abscess.  Keep all follow-up appointments as directed by your caregiver. SEEK MEDICAL CARE IF:   You have increased pain, swelling, redness, fluid drainage, or bleeding.  You have muscle aches, chills, or a general ill feeling.  You have a fever. MAKE SURE YOU:   Understand these instructions.  Will watch your condition.  Will get help right away if you are not doing well or get worse. Document Released: 10/18/2004 Document Revised: 07/10/2011 Document Reviewed: 03/23/2011 Exit[employer] Patient Information 2014 [employer].  

## 2012-10-01 NOTE — Progress Notes (Signed)
Verbal Consent Obtained. Local anesthesia with 2 cc of 2% lidocaine plain.  11 blade used to incise the lesion centrally.  Purulence expressed. Irrigated wound with 2 cc of 2% lidocaine and packed with 1/4 inch plain packing.  Cleansed and dressed.

## 2012-10-03 ENCOUNTER — Ambulatory Visit (INDEPENDENT_AMBULATORY_CARE_PROVIDER_SITE_OTHER): Payer: Medicare Other | Admitting: Physician Assistant

## 2012-10-03 VITALS — BP 118/72 | HR 86 | Temp 98.9°F | Resp 17 | Ht 67.5 in | Wt 223.0 lb

## 2012-10-03 DIAGNOSIS — L089 Local infection of the skin and subcutaneous tissue, unspecified: Secondary | ICD-10-CM

## 2012-10-03 DIAGNOSIS — L723 Sebaceous cyst: Secondary | ICD-10-CM

## 2012-10-03 LAB — WOUND CULTURE
Gram Stain: NONE SEEN
Gram Stain: NONE SEEN
Organism ID, Bacteria: NO GROWTH

## 2012-10-03 NOTE — Progress Notes (Signed)
Patient ID: Gene Hill MRN: [id number], DOB: 10-26-1940 72 y.o. Date of Encounter: 10/03/2012, 9:34 AM  Chief Complaint: Wound care   See previous note  HPI: 72 y.o. y/o male presents for wound care s/p I&D on 10/01/12.  Doing well No issues or complaints Afebrile/ no chills No nausea or vomiting Tolerating Septra Pain improved. Still tender to touch.  Daily dressing change Previous note reviewed  Past Medical History  Diagnosis Date  . GERD (gastroesophageal reflux disease)   . Arthritis   . 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 HYPERTENSION 05/03/2006  . CORONARY ARTERY DISEASE 05/03/2006    cath 1998, medical managment, cardiolite neg 3-07  . 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 HYPERLIPIDEMIA 05/03/2006  . Carotid bruit     3-11 carotid u/s was  (-)  . 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 Blood transfusion      Home Meds: Prior to Admission medications   Medication Sig Start Date End Date Taking? Authorizing Provider  Acetaminophen (TYLENOL PO) Take by mouth. Takes 2 tablets at supper and at bedtime   Yes Historical Provider, MD  aspirin EC 325 MG tablet Take by mouth. Take 325 mg by mouth daily.   Yes Historical Provider, MD  b complex vitamins capsule Take by mouth. Take by mouth.   Yes Historical Provider, MD  Cyanocobalamin (RA VITAMIN B-12 TR) 1000 MCG TBCR Take by mouth. Take 1,000 mcg by mouth daily.   Yes Historical Provider, MD  fish oil-omega-3 fatty acids 1000 MG capsule Take 2 g by mouth daily.   Yes Historical Provider, MD  fluticasone (FLONASE) 50 MCG/ACT nasal spray Place 2 sprays into the nose as needed. 08/14/10  Yes Wanda Plump, MD  HYDROcodone-acetaminophen (NORCO) 5-325 MG per tablet Take 1-2 tablets by mouth every 4 (four) hours as needed for pain. 10/01/12  Yes Phillips Odor, MD  metoprolol (LOPRESSOR) 100 MG tablet Take 0.5 tablets (50 mg  total) by mouth 2 (two) times daily. 08/27/12  Yes Wanda Plump, MD  Multiple Vitamin (MULTIVITAMIN) capsule Take 1 capsule by mouth daily.     Yes Historical Provider, MD  Naproxen Sodium (ALEVE PO) Take 1 tablet by mouth daily.   Yes Historical Provider, MD  pantoprazole (PROTONIX) 40 MG tablet Take 1 tablet (40 mg total) by mouth daily. 09/05/12 09/05/13 Yes Wanda Plump, MD  Probiotic Product (PROBIOTIC PO) Take by mouth.     Yes Historical Provider, MD  simvastatin (ZOCOR) 40 MG tablet Take 1 tablet (40 mg total) by mouth at bedtime. Take 40 mg by mouth nightly. 08/27/12  Yes Wanda Plump, MD  sulfamethoxazole-trimethoprim (BACTRIM DS,SEPTRA DS) 800-160 MG per tablet Take 1 tablet by mouth 2 (two) times daily. 10/01/12  Yes Phillips Odor, MD    Allergies: No Known Allergies  ROS: Constitutional: Afebrile, no chills Cardiovascular: negative for chest pain or palpitations Dermatological: Positive for wound. Negative for erythema, pain, or warmth.  GI: No nausea or vomiting   EXAM: Physical Exam: Blood pressure 118/72, pulse 86, temperature 98.9 F (37.2 C), temperature source Oral, resp. rate 17, height 5' 7.5" (1.715 m), weight 223 lb (101.152 kg), SpO2 91.00%., Body mass index is 34.39 kg/(m^2). General: Well developed, well nourished, in no acute distress. Nontoxic appearing. Head: Normocephalic, atraumatic, sclera non-icteric.  Neck: Supple. Lungs: Breathing is unlabored.  Heart: Normal rate. Skin:  Warm and moist. Dressing and packing in place. No induration, erythema, or tenderness to palpation. Neuro: Alert and oriented X 3. Moves all extremities spontaneously. Normal gait.  Psych:  Responds to questions appropriately with a normal affect.       PROCEDURE: Dressing and packing removed. No purulence expressed.  Sebaceous material expressed.  Wound bed healthy Irrigated with 1% plain lidocaine 5 cc. Repacked with 1/4 plain packing.  Dressing applied  LAB: Culture: no growth.    A/P: 72 y.o. y/o male with infected sebaceous cyst as above s/p I&D on 10/01/12.  Wound care per above Continue Septra.  Pain well controlled Daily dressing changes Recheck 48 hours  Signed, Rhoderick Moody, PA-C 10/03/2012 9:34 AM

## 2012-10-05 ENCOUNTER — Ambulatory Visit (INDEPENDENT_AMBULATORY_CARE_PROVIDER_SITE_OTHER): Payer: Medicare Other | Admitting: Physician Assistant

## 2012-10-05 VITALS — BP 130/68 | HR 76 | Temp 98.7°F | Resp 16 | Ht 67.5 in | Wt 225.0 lb

## 2012-10-05 DIAGNOSIS — L723 Sebaceous cyst: Secondary | ICD-10-CM

## 2012-10-05 DIAGNOSIS — L089 Local infection of the skin and subcutaneous tissue, unspecified: Secondary | ICD-10-CM

## 2012-10-05 NOTE — Progress Notes (Signed)
  Subjective:    Patient ID: Gene Hill, male    DOB: 08-21-1940, 72 y.o.   MRN: [id number]  HPI   Gene Hill is a very pleasant 72 yr old male here for follow up on an infected sebaceous cyst that was drained here 4 days ago.  States he is doing much better.  Much less pain, though there are still a few spots that are tender.  The area continues to drain quite a bit.  He is taking the antibiotics as directed and tolerating them well.  Changing the dressing daily.  Review of Systems  Skin: Positive for wound.  All other systems reviewed and are negative.       Objective:   Physical Exam  Vitals reviewed. Constitutional: He is oriented to person, place, and time. He appears well-developed and well-nourished. No distress.  HENT:  Head: Normocephalic and atraumatic.  Eyes: Conjunctivae are normal. No scleral icterus.  Pulmonary/Chest: Effort normal.  Neurological: He is alert and oriented to person, place, and time.  Skin: Skin is warm and dry.     Wound at mid low back; approx 1cm surrounding erythema and induration; mild TTP esp superiorly  Psychiatric: He has a normal mood and affect. His behavior is normal.     Wound Care: Dressing and packing removed with spontaneous drainage of sebaceous material.  I was able to express a considerable amount of further sebaceous material and a small amount of purulence.  Irrigated with 5cc 2% plain lidocaine.  Repacked with 1/4" plain packing.  Dressing applied.     Assessment & Plan:  Infected sebaceous cyst   Gene Hill is a very pleasant 72 yr old male here for wound care s/p drainage of infected sebaceous cyst 4 days ago.  Pt is doing well and area appears to be healing.  I was able to express further sebaceous material today.  Irrigated and repacked.  Continue daily dressing changes and hot compresses.  Continue abx.  Follow up 48 hours.

## 2012-10-07 ENCOUNTER — Ambulatory Visit (INDEPENDENT_AMBULATORY_CARE_PROVIDER_SITE_OTHER): Payer: Medicare Other | Admitting: Physician Assistant

## 2012-10-07 VITALS — BP 122/68 | HR 79 | Temp 98.6°F | Resp 18 | Ht 67.5 in | Wt 223.0 lb

## 2012-10-07 DIAGNOSIS — L723 Sebaceous cyst: Secondary | ICD-10-CM

## 2012-10-07 DIAGNOSIS — L089 Local infection of the skin and subcutaneous tissue, unspecified: Secondary | ICD-10-CM

## 2012-10-07 NOTE — Progress Notes (Signed)
  Subjective:    Patient ID: Gene Hill, male    DOB: January 28, 1940, 72 y.o.   MRN: [id number]  Wound Check     Gene Hill is a very pleasant 72 yr old male here for follow up on an infected sebaceous cyst.  Previous notes reviewed.  Pt reports he is doing well, continuing to improve.  Changing the dressing at least once daily.  Warm compresses.  Taking the antibiotic as directed and tolerating it well.    Review of Systems  Skin: Positive for wound.  All other systems reviewed and are negative.       Objective:   Physical Exam  Vitals reviewed. Constitutional: He is oriented to person, place, and time. He appears well-developed and well-nourished. No distress.  HENT:  Head: Normocephalic and atraumatic.  Eyes: Conjunctivae are normal. No scleral icterus.  Pulmonary/Chest: Effort normal.  Neurological: He is alert and oriented to person, place, and time.  Skin: Skin is warm and dry.     Healing wound of back; approx 0.5cm surrounding erythema and induration; moderate TTP but only with deep palpation; wound bed visible and healthy  Psychiatric: He has a normal mood and affect. His behavior is normal.     Wound Care: Dressing and packing removed.  Both saturated.  Expressed further sebaceous material from the area but no purulence.  Small piece of cyst wall removed.  Irrigated with 5cc 2% plain lidocaine.  Loosely repacked and dressed.       Assessment & Plan:  Infected sebaceous cyst   Gene Hill is a very pleasant 72 yr old male here for wound care following I&D of infected sebaceous cyst.  Wound appears to be healing well.  I am still able to express sebaceous material.  I have loosely repacked the wound.  Continue daily and prn dressing changes.  Finish abx as directed.  Follow up in 3 days.  Fast track card updated.

## 2012-10-10 ENCOUNTER — Ambulatory Visit (INDEPENDENT_AMBULATORY_CARE_PROVIDER_SITE_OTHER): Payer: Medicare Other | Admitting: Physician Assistant

## 2012-10-10 VITALS — BP 110/70 | HR 72 | Temp 98.9°F | Resp 16 | Ht 67.0 in | Wt 224.0 lb

## 2012-10-10 DIAGNOSIS — L723 Sebaceous cyst: Secondary | ICD-10-CM

## 2012-10-10 DIAGNOSIS — Z23 Encounter for immunization: Secondary | ICD-10-CM

## 2012-10-10 DIAGNOSIS — L089 Local infection of the skin and subcutaneous tissue, unspecified: Secondary | ICD-10-CM

## 2012-10-10 NOTE — Progress Notes (Signed)
  Subjective:    Patient ID: Gene Hill, male    DOB: 1940-05-19, 72 y.o.   MRN: [id number]  HPI This 72 y.o. male presents for evaluation of infected sebaceous cyst on the back, s/p I&D on 10/01/2012.  He has one day left of doxycycline, which he is tolerating well.  No pain.  No fever, chills.  Wound care over the past week has revealed additional sebaceous material and cyst sac pieces, but no additional urulence.  Dressing changes daily are performed by his wife.  He would like a flu vaccine while he's here today.  Review of Systems     Objective:   Physical Exam BP 110/70  Pulse 72  Temp(Src) 98.9 F (37.2 C) (Oral)  Resp 16  Ht 5\' 7"  (1.702 m)  Wt 224 lb (101.606 kg)  BMI 35.08 kg/m2  SpO2 92%  WDWNWM, A&O x 3. Dressing and packing removed.  No erythema, induration or edema.  Non-tender. No purulence expressed, but additional sebaceous material expressed.  Irrigated with 3 cc of 2% lidocaine plain.  There is obvious communication between the surgical wound and a dilated pore located 1-2 cm inferiorly and laterally to the RIGHT. Packed wound with 1/4 inch plain packing and dressed.      Assessment & Plan:  Infected sebaceous cyst Continue current wound care.  Complete antibiotic.  RTC 2 days for wound care.  Need for influenza vaccination - Plan: Flu Vaccine QUAD 36+ mos IM   Fernande Bras, PA-C Physician Assistant-Certified [facility]

## 2012-10-10 NOTE — Patient Instructions (Signed)
Continue the warm compresses and dressing changes.

## 2012-10-12 ENCOUNTER — Ambulatory Visit (INDEPENDENT_AMBULATORY_CARE_PROVIDER_SITE_OTHER): Payer: Medicare Other | Admitting: Physician Assistant

## 2012-10-12 VITALS — BP 118/80 | HR 71 | Temp 98.0°F | Resp 17 | Ht 67.0 in | Wt 224.0 lb

## 2012-10-12 DIAGNOSIS — L089 Local infection of the skin and subcutaneous tissue, unspecified: Secondary | ICD-10-CM

## 2012-10-12 DIAGNOSIS — L723 Sebaceous cyst: Secondary | ICD-10-CM

## 2012-10-12 MED ORDER — SULFAMETHOXAZOLE-TRIMETHOPRIM 800-160 MG PO TABS: 1.0000 | ORAL_TABLET | Freq: Two times a day (BID) | ORAL | Status: DC

## 2012-10-12 NOTE — Progress Notes (Signed)
Patient ID: Gene Hill MRN: [id number], DOB: 18-Aug-1940 72 y.o. Date of Encounter: 10/12/2012, 2:46 PM  Primary Physician: Willow Ora, MD  Chief Complaint: Wound care   See previous note  HPI: 72 y.o. male presents for wound care s/p I&D on 10/01/12 Doing well No issues or complaints Afebrile/ no chills No nausea or vomiting Finished Bactrim 2 days ago Had a little tenderness this morning when he woke up Continues to drain at home Daily dressing change Previous note reviewed  Past Medical History  Diagnosis Date  . GERD (gastroesophageal reflux disease)   . Arthritis   . 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 HYPERTENSION 05/03/2006  . CORONARY ARTERY DISEASE 05/03/2006    cath 1998, medical managment, cardiolite neg 3-07  . 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 HYPERLIPIDEMIA 05/03/2006  . Carotid bruit     3-11 carotid u/s was  (-)  . 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 Blood transfusion      Home Meds: Prior to Admission medications   Medication Sig Start Date End Date Taking? Authorizing Provider  Acetaminophen (TYLENOL PO) Take by mouth. Takes 2 tablets at supper and at bedtime   Yes Historical Provider, MD  aspirin EC 325 MG tablet Take by mouth. Take 325 mg by mouth daily.   Yes Historical Provider, MD  b complex vitamins capsule Take by mouth. Take by mouth.   Yes Historical Provider, MD  Cyanocobalamin (RA VITAMIN B-12 TR) 1000 MCG TBCR Take by mouth. Take 1,000 mcg by mouth daily.   Yes Historical Provider, MD  fish oil-omega-3 fatty acids 1000 MG capsule Take 2 g by mouth daily.   Yes Historical Provider, MD  fluticasone (FLONASE) 50 MCG/ACT nasal spray Place 2 sprays into the nose as needed. 08/14/10  Yes Wanda Plump, MD  HYDROcodone-acetaminophen (NORCO) 5-325 MG per tablet Take 1-2 tablets by mouth every 4 (four) hours as needed for pain. 10/01/12  Yes  Phillips Odor, MD  metoprolol (LOPRESSOR) 100 MG tablet Take 0.5 tablets (50 mg total) by mouth 2 (two) times daily. 08/27/12  Yes Wanda Plump, MD  Multiple Vitamin (MULTIVITAMIN) capsule Take 1 capsule by mouth daily.     Yes Historical Provider, MD  Naproxen Sodium (ALEVE PO) Take 1 tablet by mouth daily.   Yes Historical Provider, MD  pantoprazole (PROTONIX) 40 MG tablet Take 1 tablet (40 mg total) by mouth daily. 09/05/12 09/05/13 Yes Wanda Plump, MD  Probiotic Product (PROBIOTIC PO) Take by mouth.     Yes Historical Provider, MD  simvastatin (ZOCOR) 40 MG tablet Take 1 tablet (40 mg total) by mouth at bedtime. Take 40 mg by mouth nightly. 08/27/12  Yes Wanda Plump, MD  sulfamethoxazole-trimethoprim (BACTRIM DS,SEPTRA DS) 800-160 MG per tablet Take 1 tablet by mouth 2 (two) times daily. 10/01/12  Yes Phillips Odor, MD    Allergies: No Known Allergies  ROS: Constitutional: Afebrile, no chills Cardiovascular: negative for chest pain or palpitations Dermatological: Positive for wound and tenderness. Negative for or warmth  GI: No nausea or vomiting   EXAM: Physical Exam: Blood pressure 118/80, pulse 71, temperature 98 F (36.7 C), temperature source Oral, resp. rate 17, height 5\' 7"  (1.702 m), weight 224 lb (101.606 kg), SpO2 95.00%., Body mass index is 35.08 kg/(m^2). General: Well developed, well nourished, in no  acute distress. Nontoxic appearing. Head: Normocephalic, atraumatic, sclera non-icteric.  Neck: Supple. Lungs: Breathing is unlabored. Heart: Normal rate. Skin:  Warm and moist. Dressing and packing in place. Small amount of local erythema. No induration or tenderness to palpation. Neuro: Alert and oriented X 3. Moves all extremities spontaneously. Normal gait.  Psych:  Responds to questions appropriately with a normal affect.   PROCEDURE: Dressing and packing removed. No purulence expressed, however some sebaceous material was expressed Wound bed healthy Irrigated with 1%  plain lidocaine 5 cc. Repacked with 1/4 inch plain packing Dressing applied  LAB: Culture: no growth  A/P: 72 y.o. male with cellulitis/abscess as above s/p I&D on 10/01/12 -Wound care per above -Restart Bactrim DS 1 po bid #20 no RF -Pain well controlled -Daily dressing changes -Recheck 48 hours  Signed, Eula Listen, PA-C 10/12/2012 2:46 PM

## 2012-10-14 ENCOUNTER — Encounter: Payer: Self-pay | Admitting: Physician Assistant

## 2012-10-14 ENCOUNTER — Ambulatory Visit (INDEPENDENT_AMBULATORY_CARE_PROVIDER_SITE_OTHER): Payer: Medicare Other | Admitting: Physician Assistant

## 2012-10-14 VITALS — BP 130/76 | HR 68 | Temp 98.2°F | Resp 16 | Ht 66.0 in | Wt 225.0 lb

## 2012-10-14 DIAGNOSIS — L089 Local infection of the skin and subcutaneous tissue, unspecified: Secondary | ICD-10-CM

## 2012-10-14 DIAGNOSIS — M549 Dorsalgia, unspecified: Secondary | ICD-10-CM

## 2012-10-14 DIAGNOSIS — L723 Sebaceous cyst: Secondary | ICD-10-CM

## 2012-10-14 NOTE — Progress Notes (Signed)
   [address], [place] [address]   Phone [phone]  Subjective:    Patient ID: Gene Hill, male    DOB: [birth year], 72 y.o.   MRN: [id number]  HPI Pt presents to clinic for wound recheck.  He is having some local pain.  He is s/p I&D infected sebaceous cyst on 10/01/2012 and has been having packing changes about every 2-3 days.  There is still sebaceous material being expressed from the wound.  He is taking the abx that were Rx at his last visit without problems.  Review of Systems  Constitutional: Negative for fever and chills.  Gastrointestinal: Negative for nausea.  Skin: Positive for wound.       Objective:   Physical Exam  Vitals reviewed. Constitutional: He is oriented to person, place, and time. He appears well-developed and well-nourished.  HENT:  Head: Normocephalic and atraumatic.  Right Ear: External ear normal.  Left Ear: External ear normal.  Pulmonary/Chest: Effort normal.  Neurological: He is alert and oriented to person, place, and time.  Skin: Skin is warm and dry.  Drsg and packing removed. Sebaceous material expressed.  Pore of cyst about 1 cm below wound.  D/w pt that due to length of time of wound and small size of wound opening he may have quicker healing if the pore/cyst wall is removed.  Psychiatric: He has a normal mood and affect. His behavior is normal. Judgment and thought content normal.   Procedure:  Consent obtained. Local anesthesia with 1% lido with epi.  #11 blade used connect the pore to the present wound.  Sebaceous material was expressed and cyst wall was removed.  Due to no erythema, induration or other signs of infection wound was closed with 5-0 Ethilon #2 SI sutures.  They were placed loosely but the wound edges were brought together.  Drsg placed.       Assessment & Plan:  Sebaceous cyst - the cyst is no longer infected but the cyst wall was still present along with sebaceous material.  We removed the pore and cyst wall and  closed the wound.  He will continue taking his abx and f/u with me on 9/25 for a recheck.  Back pain - local due to past manipulation of the area.  Benny Lennert PA-C 10/14/2012 9:40 AM

## 2012-10-16 ENCOUNTER — Ambulatory Visit (INDEPENDENT_AMBULATORY_CARE_PROVIDER_SITE_OTHER): Payer: Medicare Other | Admitting: Physician Assistant

## 2012-10-16 VITALS — BP 124/70 | HR 74 | Temp 98.6°F | Resp 16 | Ht 66.0 in | Wt 223.0 lb

## 2012-10-16 DIAGNOSIS — L723 Sebaceous cyst: Secondary | ICD-10-CM

## 2012-10-16 NOTE — Progress Notes (Signed)
   [address], [place] [address]   Phone [phone]  Subjective:    Patient ID: Gene Hill, male    DOB: Jun 30, 1940, 72 y.o.   MRN: [id number]  HPI  Pt presents to clinic for wound recheck. The area is not as painful - some drainage today but mostly bloody clear fluid.  They have been changing the drsg daily.  Review of Systems  Constitutional: Negative for fever and chills.  Skin: Negative for wound.       Objective:   Physical Exam  Vitals reviewed. Constitutional: He is oriented to person, place, and time. He appears well-developed and well-nourished.  Pulmonary/Chest: Effort normal.  Neurological: He is alert and oriented to person, place, and time.  Skin: Skin is warm and dry.  Healing wound on back with small amount of drainage - no sebaceous material and no pus was expressed.  Psychiatric: He has a normal mood and affect. His behavior is normal. Judgment and thought content normal.      Assessment & Plan:  Sebaceous cyst  Healing  - recheck for suture removal in 1 week - sooner if any problems.  Benny Lennert PA-C 10/16/2012 5:23 PM

## 2012-10-23 ENCOUNTER — Ambulatory Visit (INDEPENDENT_AMBULATORY_CARE_PROVIDER_SITE_OTHER): Payer: Medicare Other | Admitting: Physician Assistant

## 2012-10-23 VITALS — BP 110/68 | HR 102 | Temp 99.2°F | Resp 16 | Ht 66.75 in | Wt 224.4 lb

## 2012-10-23 DIAGNOSIS — Z5189 Encounter for other specified aftercare: Secondary | ICD-10-CM

## 2012-10-23 DIAGNOSIS — S21209D Unspecified open wound of unspecified back wall of thorax without penetration into thoracic cavity, subsequent encounter: Secondary | ICD-10-CM

## 2012-10-23 NOTE — Progress Notes (Signed)
   [address], [place] [address]   Phone [phone]  Subjective:    Patient ID: Gene Hill, male    DOB: Aug 12, 1940, 72 y.o.   MRN: [id number]  HPI  Pt presents to clinic for wound recheck and suture removal.  # days ago slight yellowish drainage out of wound but the patient's wife states that in the last 24h the wound has closed and looks much better.  The patients states he is having no pain.  He has completely his abx.  Review of Systems     Objective:   Physical Exam  Vitals reviewed. Constitutional: He is oriented to person, place, and time. He appears well-developed and well-nourished.  HENT:  Head: Normocephalic.  Right Ear: External ear normal.  Left Ear: External ear normal.  Pulmonary/Chest: Effort normal.  Neurological: He is alert and oriented to person, place, and time.  Skin: Skin is warm and dry.  Lower area of wound is healed.  Upper portion is 90% healed with just a small opening where it was not stitched closed.  #2 sutures were removed.  There is no signs of infection - only slight erythema indicating inflammation for the healing process.  No TTP.  Psychiatric: He has a normal mood and affect. His behavior is normal. Judgment and thought content normal.          Assessment & Plan:  sebaceous cyst on back - removed - sutures removed today.  Continue to keep drsg on it until it is completely closed. Benny Lennert PA-C 10/23/2012 6:28 PM'

## 2012-10-29 ENCOUNTER — Ambulatory Visit (INDEPENDENT_AMBULATORY_CARE_PROVIDER_SITE_OTHER): Payer: Medicare Other | Admitting: Physician Assistant

## 2012-10-29 VITALS — BP 132/70 | HR 83 | Temp 98.7°F | Resp 16 | Ht 67.0 in | Wt 224.0 lb

## 2012-10-29 DIAGNOSIS — L089 Local infection of the skin and subcutaneous tissue, unspecified: Secondary | ICD-10-CM

## 2012-10-29 DIAGNOSIS — L723 Sebaceous cyst: Secondary | ICD-10-CM

## 2012-10-29 MED ORDER — DOXYCYCLINE HYCLATE 100 MG PO CAPS: 100.0000 mg | ORAL_CAPSULE | Freq: Two times a day (BID) | ORAL | Status: DC

## 2012-10-29 NOTE — Progress Notes (Signed)
I directly supervised and participated in the procedure and agree with the student's documentation.  Restart antibiotics to address possible subacute cellulitis persisting the lesion and preventing healing, though residual sebaceous material deep in the lesion could also be the cause of continued lesion.  Local wound care.  RTC if symptoms worsen/persist.

## 2012-10-29 NOTE — Progress Notes (Signed)
  Subjective:    Patient ID: Gene Hill, male    DOB: 04-Oct-1940, 72 y.o.   MRN: [id number]  HPI  Mr Gene Hill is a 72 year old male who is here for a recheck of a cyst on his lower back. His wife noticed a lot of discharge last night from the area and some 'white pus' on the outside. This morning, there was another spot of 'pus' so they decided to come have it rechecked. He denies pain or tenderness of the site. No fever, chills, or body aches.   Review of Systems    as above Objective:   Physical Exam  Skin:  4 cm round erythematous, raised lesion on lower back, to the LEFT of midline. There is a small opening below center, consistent with previous I&D that has some clear drainage. No tenderness to palpation. Boarders are indurated. No warmth in surrounding skin.  With deep palpation, minimal purulence, and small pieces of sebaceous material are expressed.   BP 132/70  Pulse 83  Temp(Src) 98.7 F (37.1 C) (Oral)  Resp 16  Ht 5\' 7"  (1.702 m)  Wt 224 lb (101.606 kg)  BMI 35.08 kg/m2  SpO2 98%      Assessment & Plan:  Infected sebaceous cyst - Plan: doxycycline (VIBRAMYCIN) 100 MG capsule  Wash the area with soap and water. Apply warm compresses to area.

## 2012-10-29 NOTE — Patient Instructions (Signed)
Complete the second round of antibiotics. Wash the area daily with soap and water. Apply a warm compresses to the area for 20 minutes 2-3 times daily.

## 2012-11-21 ENCOUNTER — Telehealth: Payer: Self-pay | Admitting: Hematology & Oncology

## 2012-11-21 NOTE — Telephone Encounter (Signed)
Faxed pt's most recent progress note to Markus Daft with [facility] from [facility] today to: [phone].    DATE LAST SEEN: 08/25/2012

## 2013-02-27 ENCOUNTER — Ambulatory Visit: Payer: Medicare HMO | Admitting: Internal Medicine

## 2013-02-27 ENCOUNTER — Ambulatory Visit: Payer: Medicare Other | Admitting: Internal Medicine

## 2013-04-20 ENCOUNTER — Encounter: Payer: Self-pay | Admitting: Internal Medicine

## 2013-04-20 ENCOUNTER — Ambulatory Visit (INDEPENDENT_AMBULATORY_CARE_PROVIDER_SITE_OTHER): Payer: Medicare HMO | Admitting: Internal Medicine

## 2013-04-20 VITALS — BP 156/81 | HR 80 | Temp 98.2°F | Wt 230.0 lb

## 2013-04-20 DIAGNOSIS — E291 Testicular hypofunction: Secondary | ICD-10-CM

## 2013-04-20 DIAGNOSIS — I10 Essential (primary) hypertension: Secondary | ICD-10-CM

## 2013-04-20 DIAGNOSIS — E349 Endocrine disorder, unspecified: Secondary | ICD-10-CM

## 2013-04-20 DIAGNOSIS — E785 Hyperlipidemia, unspecified: Secondary | ICD-10-CM

## 2013-04-20 DIAGNOSIS — K2270 Barrett's esophagus without dysplasia: Secondary | ICD-10-CM

## 2013-04-20 MED ORDER — PANTOPRAZOLE SODIUM 40 MG PO TBEC: 40.0000 mg | DELAYED_RELEASE_TABLET | Freq: Every day | ORAL | Status: DC

## 2013-04-20 MED ORDER — METOPROLOL TARTRATE 100 MG PO TABS: 50.0000 mg | ORAL_TABLET | Freq: Two times a day (BID) | ORAL | Status: DC

## 2013-04-20 MED ORDER — SIMVASTATIN 40 MG PO TABS: 40.0000 mg | ORAL_TABLET | Freq: Every day | ORAL | Status: DC

## 2013-04-20 NOTE — Assessment & Plan Note (Signed)
Refill PPIs 

## 2013-04-20 NOTE — Patient Instructions (Signed)
We are refilling the medications you  Requested  Check the  blood pressure 2 or 3 times a month   be sure it is between 110/60 and 140/85. Ideal blood pressure is 120/80. If it is consistently higher or lower, let me know  Next visit for a physical exam by August 2015, fasting \

## 2013-04-20 NOTE — Progress Notes (Signed)
Pre visit review using our clinic review tool, if applicable. No additional management support is needed unless otherwise documented below in the visit note. 

## 2013-04-20 NOTE — Assessment & Plan Note (Addendum)
See previous entry, pt is  not interested on a bone density test and  giving history of multiple myeloma I agree

## 2013-04-20 NOTE — Progress Notes (Signed)
Subjective:    Patient ID: Gene Hill, male    DOB: May 12, 1940, 73 y.o.   MRN: [id number]  DOS:  04/20/2013 Type of  visit: Routineoffice visit, here with his wife. In general feels great. Previous labs and medication list reviewed. GERD, on PPIs, very seldom have heartburn depending on diet. High cholesterol, needs a refill of simvastatin Hypertension, good medication compliance, BP today slightly elevated, BP normal at home and at other offices.  ROS no chest pain, difficulty breathing No  nausea, vomiting, diarrhea. No anxiety or depression per se.   Past Medical History  Diagnosis Date  . GERD (gastroesophageal reflux disease)   . Arthritis   . 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 HYPERTENSION 05/03/2006  . CORONARY ARTERY DISEASE 05/03/2006    cath 1998, medical managment, cardiolite neg 3-07  . 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 HYPERLIPIDEMIA 05/03/2006  . Carotid bruit     3-11 carotid u/s was  (-)  . 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 Blood transfusion     Past Surgical History  Procedure Laterality Date  . Rotator cuff repair      right  . Knee surgery  1990    right  . Spinal fusion    . Cervical fusion      History   Social History  . Marital Status: Married    Spouse Name: N/A    Number of Children: 4  . Years of Education: N/A   Occupational History  . retired    Social History Main Topics  . Smoking status: Former Research scientist (life sciences)  . Smokeless tobacco: Never Used     Comment: quit 1974, light smoker  . Alcohol Use: Yes     Comment: less than 1 per day   . Drug Use: No  . Sexual Activity: Not on file   Other Topics Concern  . Not on file   Social History Narrative  . No narrative on file        Medication List       This list is accurate as of: 04/20/13  6:33 PM.  Always use your most recent med list.               ALEVE PO  Take 1 tablet by mouth daily.     aspirin EC 325 MG tablet  Take by mouth. Take 325 mg by mouth daily.     Cinnamon 500 MG capsule  Take 500 mg by mouth 2 (two) times daily.     fish oil-omega-3 fatty acids 1000 MG capsule  Take 2 g by mouth daily.     fluticasone 50 MCG/ACT nasal spray  Commonly known as:  FLONASE  Place 2 sprays into the nose as needed.     metoprolol 100 MG tablet  Commonly known as:  LOPRESSOR  Take 0.5 tablets (50 mg total) by mouth 2 (two) times daily.     Milk Thistle 250 MG Caps  Take by mouth.     multivitamin capsule  Take 1 capsule by mouth daily.     niacin 500 MG tablet  Take 500 mg by mouth at bedtime.     pantoprazole 40 MG tablet  Commonly known as:  PROTONIX  Take 1 tablet (40 mg total) by mouth daily.     PROBIOTIC PO  Take  by mouth.     RA VITAMIN B-12 TR 1000 MCG Tbcr  Generic drug:  Cyanocobalamin  Take by mouth. Take 1,000 mcg by mouth daily.     simvastatin 40 MG tablet  Commonly known as:  ZOCOR  Take 1 tablet (40 mg total) by mouth at bedtime. Take 40 mg by mouth nightly.     TYLENOL PO  Take by mouth. Takes 2 tablets at supper and at bedtime           Objective:   Physical Exam BP 156/81  Pulse 80  Temp(Src) 98.2 F (36.8 C)  Wt 230 lb (104.327 kg)  SpO2 93% General -- alert, well-developed, NAD.  Lungs -- normal respiratory effort, no intercostal retractions, no accessory muscle use, and normal breath sounds.  Heart-- normal rate, regular rhythm, no murmur.  Extremities-- no pretibial edema bilaterally  Neurologic--  alert & oriented X3. Speech normal, gait normal, strength normal in all extremities.  Psych-- Cognition and judgment appear intact. Cooperative with normal attention span and concentration. No anxious or depressed appearing.        Assessment & Plan:

## 2013-04-20 NOTE — Assessment & Plan Note (Signed)
Well-controlled per last cholesterol panel, triglycerides are slightly elevated, plan: Refill simvastatin Watch diet

## 2013-04-20 NOTE — Assessment & Plan Note (Signed)
Normal ambulatory BPs, no change, refill beta blockers

## 2013-04-21 ENCOUNTER — Telehealth: Payer: Self-pay | Admitting: Internal Medicine

## 2013-04-21 NOTE — Telephone Encounter (Signed)
Relevant patient education mailed to patient.  

## 2013-04-27 ENCOUNTER — Ambulatory Visit (HOSPITAL_BASED_OUTPATIENT_CLINIC_OR_DEPARTMENT_OTHER): Payer: Medicare HMO | Admitting: Lab

## 2013-04-27 ENCOUNTER — Encounter: Payer: Self-pay | Admitting: Hematology & Oncology

## 2013-04-27 ENCOUNTER — Ambulatory Visit (HOSPITAL_BASED_OUTPATIENT_CLINIC_OR_DEPARTMENT_OTHER): Payer: Medicare HMO | Admitting: Hematology & Oncology

## 2013-04-27 VITALS — BP 142/79 | HR 69 | Temp 98.0°F | Resp 18 | Ht 65.0 in | Wt 230.0 lb

## 2013-04-27 DIAGNOSIS — C9001 Multiple myeloma in remission: Secondary | ICD-10-CM

## 2013-04-27 DIAGNOSIS — Z9484 Stem cells transplant status: Secondary | ICD-10-CM

## 2013-04-27 DIAGNOSIS — C9000 Multiple myeloma not having achieved remission: Secondary | ICD-10-CM

## 2013-04-27 LAB — CBC WITH DIFFERENTIAL (CANCER CENTER ONLY)
BASO#: 0 10*3/uL (ref 0.0–0.2)
BASO%: 0.7 % (ref 0.0–2.0)
EOS%: 2.6 % (ref 0.0–7.0)
Eosinophils Absolute: 0.1 10*3/uL (ref 0.0–0.5)
HEMATOCRIT: 37.8 % — AB (ref 38.7–49.9)
HGB: 12.4 g/dL — ABNORMAL LOW (ref 13.0–17.1)
LYMPH#: 1.4 10*3/uL (ref 0.9–3.3)
LYMPH%: 31.4 % (ref 14.0–48.0)
MCH: 33.2 pg (ref 28.0–33.4)
MCHC: 32.8 g/dL (ref 32.0–35.9)
MCV: 101 fL — AB (ref 82–98)
MONO#: 0.6 10*3/uL (ref 0.1–0.9)
MONO%: 14.2 % — ABNORMAL HIGH (ref 0.0–13.0)
NEUT#: 2.2 10*3/uL (ref 1.5–6.5)
NEUT%: 51.1 % (ref 40.0–80.0)
Platelets: 150 10*3/uL (ref 145–400)
RBC: 3.73 10*6/uL — ABNORMAL LOW (ref 4.20–5.70)
RDW: 13.8 % (ref 11.1–15.7)
WBC: 4.3 10*3/uL (ref 4.0–10.0)

## 2013-04-30 LAB — COMPREHENSIVE METABOLIC PANEL
ALT: 39 U/L (ref 0–53)
AST: 28 U/L (ref 0–37)
Albumin: 4.1 g/dL (ref 3.5–5.2)
Alkaline Phosphatase: 37 U/L — ABNORMAL LOW (ref 39–117)
BILIRUBIN TOTAL: 0.4 mg/dL (ref 0.2–1.2)
BUN: 15 mg/dL (ref 6–23)
CALCIUM: 9 mg/dL (ref 8.4–10.5)
CHLORIDE: 103 meq/L (ref 96–112)
CO2: 26 meq/L (ref 19–32)
CREATININE: 0.85 mg/dL (ref 0.50–1.35)
GLUCOSE: 84 mg/dL (ref 70–99)
Potassium: 4 mEq/L (ref 3.5–5.3)
Sodium: 141 mEq/L (ref 135–145)
Total Protein: 6.5 g/dL (ref 6.0–8.3)

## 2013-04-30 LAB — KAPPA/LAMBDA LIGHT CHAINS
KAPPA FREE LGHT CHN: 1.79 mg/dL (ref 0.33–1.94)
Kappa:Lambda Ratio: 1.72 — ABNORMAL HIGH (ref 0.26–1.65)
LAMBDA FREE LGHT CHN: 1.04 mg/dL (ref 0.57–2.63)

## 2013-04-30 LAB — PROTEIN ELECTROPHORESIS, SERUM, WITH REFLEX
Albumin ELP: 58.6 % (ref 55.8–66.1)
Alpha-1-Globulin: 3.8 % (ref 2.9–4.9)
Alpha-2-Globulin: 10.7 % (ref 7.1–11.8)
Beta 2: 10.5 % — ABNORMAL HIGH (ref 3.2–6.5)
Beta Globulin: 6.3 % (ref 4.7–7.2)
GAMMA GLOBULIN: 10.1 % — AB (ref 11.1–18.8)
M-SPIKE, %: 0.21 g/dL
Total Protein, Serum Electrophoresis: 6.5 g/dL (ref 6.0–8.3)

## 2013-04-30 LAB — IGG, IGA, IGM
IGA: 726 mg/dL — AB (ref 68–379)
IGG (IMMUNOGLOBIN G), SERUM: 470 mg/dL — AB (ref 650–1600)
IGM, SERUM: 21 mg/dL — AB (ref 41–251)

## 2013-04-30 LAB — IFE INTERPRETATION

## 2013-05-01 NOTE — Progress Notes (Signed)
Hematology and Oncology Follow Up Visit  Gene Hill [id number] Dec 05, 1940 73 y.o. 05/01/2013   Principle Diagnosis:   IgA kappa myeloma   Current Therapy:    Observation     Interim History:  [name] is back for followup. Has been about a month since we last saw him. He has been seen at [facility].  He looks good. Feels good. His garden is getting going for the spring and summer.  He's had no pain. He's had no cough. He's had no nausea vomiting. There's been no change in bowel or bladder habits. He's had no leg swelling. He's had no rashes.  He's been no issues with infections.  Medications: Current outpatient prescriptions:Acetaminophen (TYLENOL PO), Take by mouth. Takes 2 tablets at supper and at bedtime, Disp: , Rfl: ;  aspirin EC 325 MG tablet, Take by mouth. Take 325 mg by mouth daily., Disp: , Rfl: ;  Cinnamon 500 MG capsule, Take 500 mg by mouth 2 (two) times daily., Disp: , Rfl: ;  Cyanocobalamin (RA VITAMIN B-12 TR) 1000 MCG TBCR, Take by mouth. Take 1,000 mcg by mouth daily., Disp: , Rfl:  fish oil-omega-3 fatty acids 1000 MG capsule, Take 2 g by mouth daily., Disp: , Rfl: ;  fluticasone (FLONASE) 50 MCG/ACT nasal spray, Place 2 sprays into the nose as needed., Disp: , Rfl: ;  metoprolol (LOPRESSOR) 100 MG tablet, Take 0.5 tablets (50 mg total) by mouth 2 (two) times daily., Disp: 90 tablet, Rfl: 2;  Milk Thistle 250 MG CAPS, Take by mouth., Disp: , Rfl:  Multiple Vitamin (MULTIVITAMIN) capsule, Take 1 capsule by mouth daily.  , Disp: , Rfl: ;  Naproxen Sodium (ALEVE PO), Take 1 tablet by mouth daily., Disp: , Rfl: ;  niacin 500 MG tablet, Take 500 mg by mouth at bedtime., Disp: , Rfl: ;  pantoprazole (PROTONIX) 40 MG tablet, Take 1 tablet (40 mg total) by mouth daily., Disp: 90 tablet, Rfl: 2;  Probiotic Product (PROBIOTIC PO), Take by mouth.  , Disp: , Rfl:  simvastatin (ZOCOR) 40 MG tablet, Take 1 tablet (40 mg total) by mouth at bedtime. Take 40 mg by mouth nightly., Disp:  90 tablet, Rfl: 2  Allergies: No Known Allergies  Past Medical History, Surgical history, Social history, and Family History were reviewed and updated.  Review of Systems: As above  Physical Exam:  height is 5\' 5"  (1.651 m) and weight is 230 lb (104.327 kg). His oral temperature is 98 F (36.7 C). His blood pressure is 142/79 and his pulse is 69. His respiration is 18.  Well-developed and well-nourished white gentleman. Head and neck exam shows no ocular or oral lesions. He has no palpable lymph nodes. Lungs are clear. Cardiac exam regular in rhythm with no murmurs rubs or bruits. Abdomen is soft. He's mildly obese. Has good bowel sounds. There is no fluid wave. There is no palpable liver or spleen tip. Back exam no tenderness over the spine ribs or hips. Extremities shows no clubbing cyanosis or edema. Has good range of motion of his joints. Has good muscle strength. Skin exam no rashes. Neurological exam is nonfocal.  Lab Results  Component Value Date   WBC 4.3 04/27/2013   HGB 12.4* 04/27/2013   HCT 37.8* 04/27/2013   MCV 101* 04/27/2013   PLT 150 04/27/2013     Chemistry      Component Value Date/Time   NA 141 04/27/2013 1432   NA 139 05/02/2011 1017   K 4.0  04/27/2013 1432   K 3.8 05/02/2011 1017   CL 103 04/27/2013 1432   CL 99 05/02/2011 1017   CO2 26 04/27/2013 1432   CO2 28 05/02/2011 1017   BUN 15 04/27/2013 1432   BUN 14 05/02/2011 1017   CREATININE 0.85 04/27/2013 1432   CREATININE 1.0 05/02/2011 1017      Component Value Date/Time   CALCIUM 9.0 04/27/2013 1432   CALCIUM 8.9 05/02/2011 1017   ALKPHOS 37* 04/27/2013 1432   ALKPHOS 49 05/02/2011 1017   AST 28 04/27/2013 1432   AST 30 05/02/2011 1017   ALT 39 04/27/2013 1432   ALT 32 05/02/2011 1017   BILITOT 0.4 04/27/2013 1432   BILITOT 0.70 05/02/2011 1017      M spike 0.21 g/dL. IgA is 226 mg/dL. Kappa light chain is 1.79 mg/dL   Impression and Plan: [name] is a 73 year old gentleman with history of IgA kappa myeloma. He had high-risk  cytogenetics with a 17p- abnormality. He was treated with standard therapy and got into remission. 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  We had him on maintenance therapy which he really had a tough time with. He's been off maintenance therapy now for over a year. He really looks good. He has a very good quality of life right now.  I certainly am worried about the possibility of the myeloma becoming more active. This is going to have to be watched closely.  Am not sure when he goes back to [facility].  We will see him back in another 6 months. We will have to see how things look at that point in time. If we find that the myeloma number is are worse, then they were going to have to get him back on some kind of therapy.   [name], MD 4/10/20156:38 AM

## 2013-09-08 ENCOUNTER — Telehealth: Payer: Self-pay | Admitting: Hematology & Oncology

## 2013-09-08 NOTE — Telephone Encounter (Signed)
Pt moved feb to dec due to MD reccommened

## 2013-09-09 ENCOUNTER — Encounter: Payer: Self-pay | Admitting: Gastroenterology

## 2013-09-21 DIAGNOSIS — Z0289 Encounter for other administrative examinations: Secondary | ICD-10-CM

## 2013-09-22 ENCOUNTER — Encounter: Payer: Medicare HMO | Admitting: Internal Medicine

## 2013-09-22 ENCOUNTER — Telehealth: Payer: Self-pay | Admitting: Internal Medicine

## 2013-09-22 NOTE — Telephone Encounter (Signed)
Pt due for cpe, no show

## 2013-09-22 NOTE — Telephone Encounter (Signed)
Dr. Larose Kells states try to reschedule if not available, charge no show per protocol.   Thanks.

## 2013-12-11 ENCOUNTER — Encounter: Payer: Medicare HMO | Admitting: Internal Medicine

## 2014-01-07 ENCOUNTER — Ambulatory Visit (HOSPITAL_BASED_OUTPATIENT_CLINIC_OR_DEPARTMENT_OTHER): Payer: Medicare HMO | Admitting: Hematology & Oncology

## 2014-01-07 ENCOUNTER — Encounter: Payer: Self-pay | Admitting: Hematology & Oncology

## 2014-01-07 ENCOUNTER — Ambulatory Visit (HOSPITAL_BASED_OUTPATIENT_CLINIC_OR_DEPARTMENT_OTHER): Payer: Medicare HMO | Admitting: Lab

## 2014-01-07 ENCOUNTER — Ambulatory Visit (HOSPITAL_BASED_OUTPATIENT_CLINIC_OR_DEPARTMENT_OTHER)
Admission: RE | Admit: 2014-01-07 | Discharge: 2014-01-07 | Disposition: A | Discharge status: Home / Self Care | Payer: Medicare HMO | Source: Ambulatory Visit | Attending: Hematology & Oncology | Admitting: Hematology & Oncology

## 2014-01-07 VITALS — BP 145/84 | HR 81 | Temp 98.4°F | Resp 18 | Ht 65.0 in | Wt 225.0 lb

## 2014-01-07 DIAGNOSIS — K219 Gastro-esophageal reflux disease without esophagitis: Secondary | ICD-10-CM

## 2014-01-07 DIAGNOSIS — M25551 Pain in right hip: Secondary | ICD-10-CM | POA: Diagnosis present

## 2014-01-07 DIAGNOSIS — C9000 Multiple myeloma not having achieved remission: Secondary | ICD-10-CM | POA: Diagnosis present

## 2014-01-07 DIAGNOSIS — M47894 Other spondylosis, thoracic region: Secondary | ICD-10-CM | POA: Insufficient documentation

## 2014-01-07 DIAGNOSIS — M4856XS Collapsed vertebra, not elsewhere classified, lumbar region, sequela of fracture: Secondary | ICD-10-CM | POA: Insufficient documentation

## 2014-01-07 DIAGNOSIS — Z981 Arthrodesis status: Secondary | ICD-10-CM | POA: Diagnosis not present

## 2014-01-07 LAB — CMP (CANCER CENTER ONLY)
ALBUMIN: 3.7 g/dL (ref 3.3–5.5)
ALT(SGPT): 39 U/L (ref 10–47)
AST: 33 U/L (ref 11–38)
Alkaline Phosphatase: 42 U/L (ref 26–84)
BUN, Bld: 15 mg/dL (ref 7–22)
CO2: 27 mEq/L (ref 18–33)
Calcium: 9.9 mg/dL (ref 8.0–10.3)
Chloride: 105 mEq/L (ref 98–108)
Creat: 1.1 mg/dl (ref 0.6–1.2)
GLUCOSE: 124 mg/dL — AB (ref 73–118)
Potassium: 4.1 mEq/L (ref 3.3–4.7)
SODIUM: 142 meq/L (ref 128–145)
TOTAL PROTEIN: 7.7 g/dL (ref 6.4–8.1)
Total Bilirubin: 1.1 mg/dl (ref 0.20–1.60)

## 2014-01-07 LAB — CBC WITH DIFFERENTIAL (CANCER CENTER ONLY)
BASO#: 0 10*3/uL (ref 0.0–0.2)
BASO%: 0.6 % (ref 0.0–2.0)
EOS%: 3.4 % (ref 0.0–7.0)
Eosinophils Absolute: 0.1 10*3/uL (ref 0.0–0.5)
HEMATOCRIT: 38.5 % — AB (ref 38.7–49.9)
HGB: 12.8 g/dL — ABNORMAL LOW (ref 13.0–17.1)
LYMPH#: 1.2 10*3/uL (ref 0.9–3.3)
LYMPH%: 33.8 % (ref 14.0–48.0)
MCH: 33.3 pg (ref 28.0–33.4)
MCHC: 33.2 g/dL (ref 32.0–35.9)
MCV: 100 fL — ABNORMAL HIGH (ref 82–98)
MONO#: 0.5 10*3/uL (ref 0.1–0.9)
MONO%: 15.2 % — ABNORMAL HIGH (ref 0.0–13.0)
NEUT#: 1.7 10*3/uL (ref 1.5–6.5)
NEUT%: 47 % (ref 40.0–80.0)
Platelets: 160 10*3/uL (ref 145–400)
RBC: 3.84 10*6/uL — ABNORMAL LOW (ref 4.20–5.70)
RDW: 14.1 % (ref 11.1–15.7)
WBC: 3.6 10*3/uL — ABNORMAL LOW (ref 4.0–10.0)

## 2014-01-07 MED ORDER — METOCLOPRAMIDE HCL 10 MG PO TABS: 10.0000 mg | ORAL_TABLET | Freq: Three times a day (TID) | ORAL | Status: DC

## 2014-01-08 ENCOUNTER — Telehealth: Payer: Self-pay | Admitting: *Deleted

## 2014-01-08 NOTE — Telephone Encounter (Addendum)
Spoke with patient and relayed message.   ----- Message from [name], MD sent at 01/07/2014  5:47 PM EST ----- Please call and let him know that the bone survey looks very stable. Nothing looks suspicious that would cause pain problems. Thanks. Laurey Arrow

## 2014-01-08 NOTE — Progress Notes (Signed)
Hematology and Oncology Follow Up Visit  Gene Hill [id number] 1940-09-05 73 y.o. 01/08/2014   Principle Diagnosis:   IgA kappa myeloma   Current Therapy:    Observation     Interim History:  [name] is back for followup. Has been about 8 months since we last saw him. He has been seen at [facility].  He looks good. Feels good. He is busy taking care of his wife. She had back surgery about 5 weeks ago. She's had a tough time with this.  He's had no pain. He's had no cough. He's had no nausea or vomiting. There's been no change in bowel or bladder habits. He's had no leg swelling. He's had no rashes.  He's had no issues with infections.  We last saw him back in April, [name] was 0.21 g/L. His IgA level was 726 mg/dL. Kappa light chain was 1.79 mg/dL  Medications: Current outpatient prescriptions: Acetaminophen (TYLENOL PO), Take by mouth. Takes 2 tablets at supper and at bedtime, Disp: , Rfl: ;  aspirin EC 325 MG tablet, Take by mouth. Take 325 mg by mouth daily., Disp: , Rfl: ;  Cinnamon 500 MG capsule, Take 500 mg by mouth 2 (two) times daily., Disp: , Rfl: ;  fish oil-omega-3 fatty acids 1000 MG capsule, Take 2 g by mouth daily., Disp: , Rfl:  metoprolol (LOPRESSOR) 100 MG tablet, Take 0.5 tablets (50 mg total) by mouth 2 (two) times daily., Disp: 90 tablet, Rfl: 2;  Multiple Vitamin (MULTIVITAMIN) capsule, Take 1 capsule by mouth daily.  , Disp: , Rfl: ;  Naproxen Sodium (ALEVE PO), Take 1 tablet by mouth daily., Disp: , Rfl: ;  niacin 500 MG tablet, Take 500 mg by mouth at bedtime., Disp: , Rfl:  pantoprazole (PROTONIX) 40 MG tablet, Take 1 tablet (40 mg total) by mouth daily., Disp: 90 tablet, Rfl: 2;  Probiotic Product (PROBIOTIC PO), Take by mouth.  , Disp: , Rfl: ;  pyridOXINE (VITAMIN B-6) 100 MG tablet, Take 100 mg by mouth daily., Disp: , Rfl: ;  simvastatin (ZOCOR) 40 MG tablet, Take 1 tablet (40 mg total) by mouth at bedtime. Take 40 mg by mouth nightly., Disp: 90  tablet, Rfl: 2 metoCLOPramide (REGLAN) 10 MG tablet, Take 1 tablet (10 mg total) by mouth 4 (four) times daily -  before meals and at bedtime., Disp: 120 tablet, Rfl: 3  Allergies: No Known Allergies  Past Medical History, Surgical history, Social history, and Family History were reviewed and updated.  Review of Systems: As above  Physical Exam:  height is 5\' 5"  (1.651 m) and weight is 225 lb (102.059 kg). His oral temperature is 98.4 F (36.9 C). His blood pressure is 145/84 and his pulse is 81. His respiration is 18.  Well-developed and well-nourished white gentleman. Head and neck exam shows no ocular or oral lesions. He has no palpable lymph nodes. Lungs are clear. Cardiac exam regular rate and rhythm with no murmurs rubs or bruits. Abdomen is soft. He's mildly obese. Has good bowel sounds. There is no fluid wave. There is no palpable liver or spleen tip. Back exam shows no tenderness over the spine ribs or hips. Extremities shows no clubbing cyanosis or edema. Has good range of motion of his joints. Has good muscle strength. Skin exam no rashes. Neurological exam is nonfocal.  Lab Results  Component Value Date   WBC 3.6* 01/07/2014   HGB 12.8* 01/07/2014   HCT 38.5* 01/07/2014   MCV  100* 01/07/2014   PLT 160 01/07/2014     Chemistry      Component Value Date/Time   NA 142 01/07/2014 1012   NA 141 04/27/2013 1432   K 4.1 01/07/2014 1012   K 4.0 04/27/2013 1432   CL 105 01/07/2014 1012   CL 103 04/27/2013 1432   CO2 27 01/07/2014 1012   CO2 26 04/27/2013 1432   BUN 15 01/07/2014 1012   BUN 15 04/27/2013 1432   CREATININE 1.1 01/07/2014 1012   CREATININE 0.85 04/27/2013 1432      Component Value Date/Time   CALCIUM 9.9 01/07/2014 1012   CALCIUM 9.0 04/27/2013 1432   ALKPHOS 42 01/07/2014 1012   ALKPHOS 37* 04/27/2013 1432   AST 33 01/07/2014 1012   AST 28 04/27/2013 1432   ALT 39 01/07/2014 1012   ALT 39 04/27/2013 1432   BILITOT 1.10 01/07/2014 1012   BILITOT 0.4  04/27/2013 1432       Impression and Plan: Gene Hill is a 73 year old gentleman with history of IgA kappa myeloma. 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 He was treated with standard therapy and got into remission. 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  We had him on maintenance therapy which he really had a tough time with. He's been off maintenance therapy now for close to 2 years. He really looks good. He has a very good quality of life right now.  His IgA level is now up to 1260 mg/dL. His Kappa light chain is 2.73 mg/dL. These are certainly higher.  I certainly am worried about the possibility of the myeloma becoming more active. This is going to have to be watched closely.  He goes back to [facility] in 4 months. Since he is asymptomatic right now, I think we can just watch this.  We will see him back in another 8 months. We will have to see how things look at that point in time. If we find that the myeloma levels are worse, then they were going to have to get him back on some kind of therapy.   [name], MD 12/18/20155:16 PM

## 2014-01-12 LAB — PROTEIN ELECTROPHORESIS, SERUM, WITH REFLEX
Albumin ELP: 56 % (ref 55.8–66.1)
Alpha-1-Globulin: 3.5 % (ref 2.9–4.9)
Alpha-2-Globulin: 9.3 % (ref 7.1–11.8)
BETA GLOBULIN: 5.9 % (ref 4.7–7.2)
Beta 2: 13.4 % — ABNORMAL HIGH (ref 3.2–6.5)
Gamma Globulin: 11.9 % (ref 11.1–18.8)
M-Spike, %: 0.56 g/dL
Total Protein, Serum Electrophoresis: 7.2 g/dL (ref 6.0–8.3)

## 2014-01-12 LAB — IFE INTERPRETATION

## 2014-01-12 LAB — IGG, IGA, IGM
IgA: 1260 mg/dL — ABNORMAL HIGH (ref 68–379)
IgG (Immunoglobin G), Serum: 390 mg/dL — ABNORMAL LOW (ref 650–1600)
IgM, Serum: 16 mg/dL — ABNORMAL LOW (ref 41–251)

## 2014-01-12 LAB — KAPPA/LAMBDA LIGHT CHAINS
KAPPA FREE LGHT CHN: 2.73 mg/dL — AB (ref 0.33–1.94)
Kappa:Lambda Ratio: 4.14 — ABNORMAL HIGH (ref 0.26–1.65)
Lambda Free Lght Chn: 0.66 mg/dL (ref 0.57–2.63)

## 2014-01-12 LAB — LACTATE DEHYDROGENASE: LDH: 146 U/L (ref 94–250)

## 2014-01-20 ENCOUNTER — Ambulatory Visit (INDEPENDENT_AMBULATORY_CARE_PROVIDER_SITE_OTHER): Payer: Medicare HMO | Admitting: Family Medicine

## 2014-01-20 VITALS — BP 150/82 | HR 96 | Temp 98.4°F | Resp 16 | Ht 66.75 in | Wt 225.0 lb

## 2014-01-20 DIAGNOSIS — K640 First degree hemorrhoids: Secondary | ICD-10-CM

## 2014-01-20 DIAGNOSIS — K625 Hemorrhage of anus and rectum: Secondary | ICD-10-CM

## 2014-01-20 DIAGNOSIS — C9000 Multiple myeloma not having achieved remission: Secondary | ICD-10-CM

## 2014-01-20 LAB — POCT CBC
Granulocyte percent: 64.2 %G (ref 37–80)
HEMATOCRIT: 40.2 % — AB (ref 43.5–53.7)
Hemoglobin: 12.7 g/dL — AB (ref 14.1–18.1)
LYMPH, POC: 1.1 (ref 0.6–3.4)
MCH, POC: 32 pg — AB (ref 27–31.2)
MCHC: 31.6 g/dL — AB (ref 31.8–35.4)
MCV: 101.5 fL — AB (ref 80–97)
MID (cbc): 0.3 (ref 0–0.9)
MPV: 5.8 fL (ref 0–99.8)
POC Granulocyte: 2.4 (ref 2–6.9)
POC LYMPH %: 28.8 % (ref 10–50)
POC MID %: 7 %M (ref 0–12)
Platelet Count, POC: 186 10*3/uL (ref 142–424)
RBC: 3.97 M/uL — AB (ref 4.69–6.13)
RDW, POC: 16.2 %
WBC: 3.7 10*3/uL — AB (ref 4.6–10.2)

## 2014-01-20 LAB — IFOBT (OCCULT BLOOD): IFOBT: POSITIVE

## 2014-01-20 MED ORDER — HYDROCORTISONE ACE-PRAMOXINE 2.5-1 % RE CREA: 1.0000 "application " | TOPICAL_CREAM | Freq: Three times a day (TID) | RECTAL | Status: DC

## 2014-01-20 NOTE — Patient Instructions (Signed)
Use the hemorrhoidal cream 3 or 4 times a day as needed for rectal irritation. After for 5 days you can discontinue this. If you continue passing blood in any significant amounts get rechecked. If you're doing well, waiting to see her primary care in a few weeks and ask him if he could do some stool specimens on you. If the stool specimens are negative, then I think no further evaluation would be needed. If the specimens are positive you will have to discuss with him whether or not you should undergo colonoscopy with your immune system.  Return at anytime if increased bleeding

## 2014-01-20 NOTE — Progress Notes (Signed)
Subjective: 73 year old man with history of multiple myeloma. Yesterday he had hemorrhoidal bleeding, at least that's what thinks it was. He is use some OTC medication. He did not see blood swelling. Yesterday the blood on his underwear and on toilet tissue. He has had some hemorrhoidal problems off and on. An older version of an OTC medication that had sharp oral and it helped him a lot, but the current one does not help. No abdominal pain, nausea or vomiting.  His multiple myeloma did not respond to a stem cell transplant so is still an ongoing active disease. He has not gotten a colonoscopy done in about 11 years. He did have negative stools one year ago. He has been reluctant to get the prep for colonoscopy due to his impaired immunity.  Objective: No acute distress. Abdomen is large, soft without mass or tenderness. Normal bowel sounds. Anus appears normal except for a little area of erythema on the left side. Just inside the anal ring from that is a tiny hemorrhoid, nonthrombosed. I cannot tell whether this area had ruptured and bled, but presumably that is what took place. No other lesions were noted. Internal digital exam did not reveal anything abnormal.  Assessment: External hemorrhoid, first-degree, status post rectal bleeding  Plan: CBC Stool softener MiraLAX if needed Get stools repeated in about 2-3 weeks by his primary doctor. If he continues having blood in his stool, then he would be forced to go ahead and undergo a colonoscopy probably, but they will have to decide that.  Results for orders placed or performed in visit on 01/20/14  POCT CBC  Result Value Ref Range   WBC 3.7 (A) 4.6 - 10.2 K/uL   Lymph, poc 1.1 0.6 - 3.4   POC LYMPH PERCENT 28.8 10 - 50 %L   MID (cbc) 0.3 0 - 0.9   POC MID % 7.0 0 - 12 %M   POC Granulocyte 2.4 2 - 6.9   Granulocyte percent 64.2 37 - 80 %G   RBC 3.97 (A) 4.69 - 6.13 M/uL   Hemoglobin 12.7 (A) 14.1 - 18.1 g/dL   HCT, POC 40.2 (A) 43.5 -  53.7 %   MCV 101.5 (A) 80 - 97 fL   MCH, POC 32.0 (A) 27 - 31.2 pg   MCHC 31.6 (A) 31.8 - 35.4 g/dL   RDW, POC 16.2 %   Platelet Count, POC 186 142 - 424 K/uL   MPV 5.8 0 - 99.8 fL  IFOBT POC (occult bld, rslt in office)  Result Value Ref Range   IFOBT Positive

## 2014-02-05 ENCOUNTER — Ambulatory Visit (INDEPENDENT_AMBULATORY_CARE_PROVIDER_SITE_OTHER): Payer: PPO | Admitting: Internal Medicine

## 2014-02-05 ENCOUNTER — Encounter: Payer: Self-pay | Admitting: Internal Medicine

## 2014-02-05 VITALS — BP 151/86 | HR 86 | Temp 97.6°F | Ht 67.0 in | Wt 228.1 lb

## 2014-02-05 DIAGNOSIS — M1990 Unspecified osteoarthritis, unspecified site: Secondary | ICD-10-CM

## 2014-02-05 DIAGNOSIS — I2510 Atherosclerotic heart disease of native coronary artery without angina pectoris: Secondary | ICD-10-CM

## 2014-02-05 DIAGNOSIS — Z23 Encounter for immunization: Secondary | ICD-10-CM

## 2014-02-05 DIAGNOSIS — C9000 Multiple myeloma not having achieved remission: Secondary | ICD-10-CM

## 2014-02-05 DIAGNOSIS — E785 Hyperlipidemia, unspecified: Secondary | ICD-10-CM

## 2014-02-05 DIAGNOSIS — Z0000 Encounter for general adult medical examination without abnormal findings: Secondary | ICD-10-CM

## 2014-02-05 DIAGNOSIS — I10 Essential (primary) hypertension: Secondary | ICD-10-CM

## 2014-02-05 DIAGNOSIS — R42 Dizziness and giddiness: Secondary | ICD-10-CM

## 2014-02-05 DIAGNOSIS — K2270 Barrett's esophagus without dysplasia: Secondary | ICD-10-CM

## 2014-02-05 DIAGNOSIS — K219 Gastro-esophageal reflux disease without esophagitis: Secondary | ICD-10-CM

## 2014-02-05 LAB — HEMOGLOBIN A1C: HEMOGLOBIN A1C: 6 % (ref 4.6–6.5)

## 2014-02-05 LAB — LIPID PANEL
Cholesterol: 134 mg/dL (ref 0–200)
HDL: 41.8 mg/dL (ref >39.00)
LDL CALC: 59 mg/dL (ref 0–99)
NonHDL: 92.2
TRIGLYCERIDES: 167 mg/dL — AB (ref 0.0–149.0)
Total CHOL/HDL Ratio: 3
VLDL: 33.4 mg/dL (ref 0.0–40.0)

## 2014-02-05 LAB — TSH: TSH: 1.8 u[IU]/mL (ref 0.35–4.50)

## 2014-02-05 MED ORDER — HYDROCODONE-ACETAMINOPHEN 5-325 MG PO TABS: 1.0000 | ORAL_TABLET | Freq: Every evening | ORAL | Status: DC | PRN

## 2014-02-05 MED ORDER — CARVEDILOL 6.25 MG PO TABS: 6.2500 mg | ORAL_TABLET | Freq: Two times a day (BID) | ORAL | Status: DC

## 2014-02-05 MED ORDER — PANTOPRAZOLE SODIUM 40 MG PO TBEC
40.0000 mg | DELAYED_RELEASE_TABLET | Freq: Every day | ORAL | Status: DC
Start: 2014-02-05 — End: 2015-02-02

## 2014-02-05 MED ORDER — METOCLOPRAMIDE HCL 10 MG PO TABS: 10.0000 mg | ORAL_TABLET | Freq: Three times a day (TID) | ORAL | Status: DC

## 2014-02-05 MED ORDER — SIMVASTATIN 40 MG PO TABS: 40.0000 mg | ORAL_TABLET | Freq: Every day | ORAL | Status: DC

## 2014-02-05 NOTE — Assessment & Plan Note (Signed)
Continue simvastatin niacin, check labsdizzines

## 2014-02-05 NOTE — Progress Notes (Signed)
Pre visit review using our clinic review tool, if applicable. No additional management support is needed unless otherwise documented below in the visit note. 

## 2014-02-05 NOTE — Assessment & Plan Note (Signed)
Chronic dizziness when he stands, patient thinks metoprolol is contributing factor, will change to carvedilol. Also was recently prescribed Reglan because sometimes the dizziness come with nausea. Recommend to minimize use her Reglan.

## 2014-02-05 NOTE — Progress Notes (Signed)
Subjective:    Patient ID: Gene Hill, male    DOB: 10/22/1940, 74 y.o.   MRN: [id number]  DOS:  02/05/2014 Type of visit - description :   Here for Medicare AWV:   1. Risk factors based on Past M, S, F history: reviewed   2. Physical Activities: active,  garden work , some walking  3. Depression/mood: No problems noted or reported   4. Hearing: slt decreased over time, occ tinnitus x years-- unchanged 5. ADL's: Independent   6. Fall Risk: no recent falls, precautions discussed 7. home Safety: does feel safe at home   8. Height, weight, &visual acuity: see VS, uses glasses, sees eye md q year 9. Counseling: provided   10. Labs ordered based on risk factors: if needed   11. Referral Coordination: if needed   12. Care Plan, see assessment and plan   13. Cognitive Assessment: Motor skills and cognition within normal for age  37. Care team updated  15 Written plan of care provided  In addition, today we discussed the following: Chol-- good med compliance  dizzines-- long history of dizziness, usually when he stands up, occasionally associated with nausea. Recently saw oncology, they suggested Reglan and hold PPIs (per pt). Mild help with dizziness? No associated stroke symptoms CAD, on aspirin, asymptomatic, BP today slightly elevated, ambulatory BPs 130/73. Pain--takes Aleve 2 tablets twice a day with food.   ROS Denies chest pain or difficulty breathing No nausea, vomiting, diarrhea blood in the stools No dysphasia or odynophagia No dysuria, gross hematuria or difficulty urinating No diplopia, slurred speech or motor deficits.  Past Medical History  Diagnosis Date  . GERD (gastroesophageal reflux disease)   . Arthritis   . 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 HYPERTENSION 05/03/2006  . CORONARY ARTERY DISEASE 05/03/2006    cath 1998, medical managment, cardiolite neg 3-07  . 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 PEPTIC ULCER  DISEASE 07/22/2008    per EGD 6-10 .Marland Kitchen ulcer duodenitis   . HYPERLIPIDEMIA 05/03/2006  . Carotid bruit     3-11 carotid u/s was  (-)  . 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 Blood transfusion     Past Surgical History  Procedure Laterality Date  . Rotator cuff repair      right  . Knee surgery  1990    right  . Spinal fusion    . Cervical fusion      History   Social History  . Marital Status: Married    Spouse Name: N/A    Number of Children: 4  . Years of Education: N/A   Occupational History  . retired    Social History Main Topics  . Smoking status: Former Smoker -- 0.25 packs/day for 20 years    Types: Cigarettes    Start date: 04/28/1954    Quit date: 11/28/1974  . Smokeless tobacco: Never Used     Comment: quit 35 years ago, 1976  . Alcohol Use: Yes     Comment: less than 1 per day   . Drug Use: No  . Sexual Activity: Not on file   Other Topics Concern  . Not on file   Social History Narrative   Household-- pt, wife, son     Family History  Problem Relation Age of Onset  . Prostate cancer Neg Hx   . Kidney cancer Father   . Leukemia Mother   .  Pulmonary embolism Brother   . Stroke Maternal Grandmother   . Heart attack Maternal Grandfather   . Stroke Paternal Grandmother   . Colon cancer Neg Hx   . Esophageal cancer Neg Hx   . Rectal cancer Neg Hx   . Stomach cancer Neg Hx   . Cerebral palsy Son        Medication List       This list is accurate as of: 02/05/14 11:59 PM.  Always use your most recent med list.               ALEVE PO  Take 2 tablets by mouth 2 (two) times daily as needed.     aspirin EC 325 MG tablet  Take by mouth. Take 325 mg by mouth daily.     carvedilol 6.25 MG tablet  Commonly known as:  COREG  Take 1 tablet (6.25 mg total) by mouth 2 (two) times daily with a meal.     Cinnamon 500 MG capsule  Take 500 mg by mouth 2 (two) times daily.     fish oil-omega-3 fatty acids 1000 MG capsule  Take 2 g by mouth daily.       HYDROcodone-acetaminophen 5-325 MG per tablet  Commonly known as:  NORCO/VICODIN  Take 1-2 tablets by mouth at bedtime as needed.     hydrocortisone-pramoxine 2.5-1 % rectal cream  Commonly known as:  ANALPRAM HC  Place 1 application rectally 3 (three) times daily.     metoCLOPramide 10 MG tablet  Commonly known as:  REGLAN  Take 1 tablet (10 mg total) by mouth 4 (four) times daily -  before meals and at bedtime.     multivitamin capsule  Take 1 capsule by mouth daily.     niacin 500 MG tablet  Take 500 mg by mouth at bedtime.     pantoprazole 40 MG tablet  Commonly known as:  PROTONIX  Take 1 tablet (40 mg total) by mouth daily.     PROBIOTIC PO  Take by mouth.     pyridOXINE 100 MG tablet  Commonly known as:  VITAMIN B-6  Take 100 mg by mouth daily.     simvastatin 40 MG tablet  Commonly known as:  ZOCOR  Take 1 tablet (40 mg total) by mouth at bedtime.     TYLENOL PO  Take 2 tablets by mouth at bedtime.           Objective:   Physical Exam BP 151/86 mmHg  Pulse 86  Temp(Src) 97.6 F (36.4 C) (Oral)  Ht 5' 7"  (1.702 m)  Wt 228 lb 2 oz (103.477 kg)  BMI 35.72 kg/m2  SpO2 91% General -- alert, well-developed, NAD.  Neck --no thyromegaly , normal carotid pulse  HEENT-- Not pale.   Lungs -- normal respiratory effort, no intercostal retractions, no accessory muscle use, and normal breath sounds.  Heart-- normal rate, regular rhythm, no murmur.  Abdomen-- Not distended, good bowel sounds,soft, non-tender.  Extremities-- trace pretibial edema bilaterally  Neurologic--  alert & oriented X3. Speech normal, gait appropriate for age, strength symmetric and appropriate for age.  Psych-- Cognition and judgment appear intact. Cooperative with normal attention span and concentration. No anxious or depressed appearing.        Assessment & Plan:

## 2014-02-05 NOTE — Assessment & Plan Note (Signed)
See comments under CAD, will change metoprolol to carvedilol

## 2014-02-05 NOTE — Assessment & Plan Note (Signed)
Currently asymptomatic, continue PPIs

## 2014-02-05 NOTE — Assessment & Plan Note (Signed)
Asymptomatic, continue with aspirin, simvastatin, niacin. Has chronic dizziness when he stands, patient thinks related to metoprolol. We'll do a trial switch from metoprolol to carvedilol. Continue monitoring BPs, follow-up 3 months

## 2014-02-05 NOTE — Assessment & Plan Note (Signed)
Has moderate generalized joint aches, on exam today there is no synovitis in the hands or wrists. Likely multifactorial from DJD, multiple myeloma, statins? Takes Aleve 2 tablets twice a day Plan: Minimize use of Aleve to one or 2 with breakfast, Vicodin at nighttime, watch GI side effects

## 2014-02-05 NOTE — Assessment & Plan Note (Addendum)
Td 2013 pneumonia shot 2008 and -per pt- before stem cell transplant 2011  prevnar-- today No shingles shot due to  MM Had a flu shot    last PSA 2013, pro-cons of continue screening discussed, elected no further  had a colonoscopy in 2004.  The found diverticuli.  No polyps.  Due for a Cscope 2014 , pt elected no further screening  diet  and exercise discussed

## 2014-02-05 NOTE — Patient Instructions (Signed)
Labs before you leave   Change to metoprolol to carvedilol see if that helped with dizziness Okay to take Reglan as needed  Check the  blood pressure  weekly  Be sure your blood pressure is between 110/65 and  145/85.  if it is consistently higher or lower, let me know     Try to take less ALEVE---- take 1 or 2 in the morning with food, in the afternoon or at bedtime take Vicodin instead. Will cause drowsiness.  Continue Protonix    Please come back to the office in 3 months for a routine check up        [place] cause injuries and can affect all age groups. It is possible to use preventive measures to significantly decrease the likelihood of falls. There are many simple measures which can make your home safer and prevent falls. OUTDOORS  Repair cracks and edges of walkways and driveways.  Remove high doorway thresholds.  Trim shrubbery on the main path into your home.  Have good outside lighting.  Clear walkways of tools, rocks, debris, and clutter.  Check that handrails are not broken and are securely fastened. Both sides of steps should have handrails.  Have leaves, snow, and ice cleared regularly.  Use sand or salt on walkways during winter months.  In the garage, clean up grease or oil spills. BATHROOM  Install night lights.  Install grab bars by the toilet and in the tub and shower.  Use non-skid mats or decals in the tub or shower.  Place a plastic non-slip stool in the shower to sit on, if needed.  Keep floors dry and clean up all water on the floor immediately.  Remove soap buildup in the tub or shower on a regular basis.  Secure bath mats with non-slip, double-sided rug tape.  Remove throw rugs and tripping hazards from the floors. BEDROOMS  Install night lights.  Make sure a bedside light is easy to reach.  Do not use oversized bedding.  Keep a telephone by your bedside.  Have a firm chair with side arms to  use for getting dressed.  Remove throw rugs and tripping hazards from the floor. KITCHEN  Keep handles on pots and pans turned toward the center of the stove. Use back burners when possible.  Clean up spills quickly and allow time for drying.  Avoid walking on wet floors.  Avoid hot utensils and knives.  Position shelves so they are not too high or low.  Place commonly used objects within easy reach.  If necessary, use a sturdy step stool with a grab bar when reaching.  Keep electrical cables out of the way.  Do not use floor polish or wax that makes floors slippery. If you must use wax, use non-skid floor wax.  Remove throw rugs and tripping hazards from the floor. STAIRWAYS  Never leave objects on stairs.  Place handrails on both sides of stairways and use them. Fix any loose handrails. Make sure handrails on both sides of the stairways are as long as the stairs.  Check carpeting to make sure it is firmly attached along stairs. Make repairs to worn or loose carpet promptly.  Avoid placing throw rugs at the top or bottom of stairways, or properly secure the rug with carpet tape to prevent slippage. Get rid of throw rugs, if possible.  Have an electrician put in a light switch at the top and bottom of the stairs. OTHER FALL PREVENTION TIPS  Wear low-heel or rubber-soled shoes that are supportive and fit well. Wear closed toe shoes.  When using a stepladder, make sure it is fully opened and both spreaders are firmly locked. Do not climb a closed stepladder.  Add color or contrast paint or tape to grab bars and handrails in your home. Place contrasting color strips on first and last steps.  Learn and use mobility aids as needed. Install an electrical emergency response system.  Turn on lights to avoid dark areas. Replace light bulbs that burn out immediately. Get light switches that glow.  Arrange furniture to create clear pathways. Keep furniture in the same  place.  Firmly attach carpet with non-skid or double-sided tape.  Eliminate uneven floor surfaces.  Select a carpet pattern that does not visually hide the edge of steps.  Be aware of all pets. OTHER HOME SAFETY TIPS  Set the water temperature for 120 F (48.8 C).  Keep emergency numbers on or near the telephone.  Keep smoke detectors on every level of the home and near sleeping areas. Document Released: 12/29/2001 Document Revised: 07/10/2011 Document Reviewed: 03/30/2011 [facility] Patient Information 2015 [place], [place]. This information is not intended to replace advice given to you by your health care provider. Make sure you discuss any questions you have with your health care provider.   Preventive health Ages 24 and over  Blood pressure check.** / Every 1 to 2 years.  Lipid and cholesterol check.**/ Every 5 years beginning at age 43.  Lung cancer screening. / Every year if you are aged 33-80 years and have a 30-pack-year history of smoking and currently smoke or have quit within the past 15 years. Yearly screening is stopped once you have quit smoking for at least 15 years or develop a health problem that would prevent you from having lung cancer treatment.  Fecal occult blood test (FOBT) of stool. / Every year beginning at age 26 and continuing until age 18. You may not have to do this test if you get a colonoscopy every 10 years.  Flexible sigmoidoscopy** or colonoscopy.** / Every 5 years for a flexible sigmoidoscopy or every 10 years for a colonoscopy beginning at age 50 and continuing until age 82.  Hepatitis C blood test.** / For all people born from [birth year] through 1967 and any individual with known risks for hepatitis C.  Abdominal aortic aneurysm (AAA) screening.** / A one-time screening for ages 73 to 75 years who are current or former smokers.  Skin self-exam. / Monthly.  Influenza vaccine. / Every year.  Tetanus, diphtheria, and acellular pertussis (Tdap/Td)  vaccine.** / 1 dose of Td every 10 years.  Varicella vaccine.** / Consult your health care provider.  Zoster vaccine.** / 1 dose for adults aged 110 years or older.  Pneumococcal 13-valent conjugate (PCV13) vaccine.** / Consult your health care provider.  Pneumococcal polysaccharide (PPSV23) vaccine.** / 1 dose for all adults aged 52 years and older.  Meningococcal vaccine.** / Consult your health care provider.  Hepatitis A vaccine.** / Consult your health care provider.  Hepatitis B vaccine.** / Consult your health care provider.  Haemophilus influenzae type b (Hib) vaccine.** / Consult your health care provider. **Family history and personal history of risk and conditions may change your health care provider's recommendations. Document Released: 03/06/2001 Document Revised: 01/13/2013 Document Reviewed: 06/05/2010 [facility] Patient Information 2015 [place], [place]. This information is not intended to replace advice given to you by your health care provider. Make sure you discuss any questions you have  with your health care provider.  

## 2014-02-22 ENCOUNTER — Other Ambulatory Visit: Payer: Medicare HMO | Admitting: Lab

## 2014-02-22 ENCOUNTER — Ambulatory Visit: Payer: Medicare HMO | Admitting: Hematology & Oncology

## 2014-03-13 ENCOUNTER — Encounter: Payer: Self-pay | Admitting: Gastroenterology

## 2014-03-31 ENCOUNTER — Telehealth: Payer: Self-pay | Admitting: Internal Medicine

## 2014-03-31 MED ORDER — HYDROCODONE-ACETAMINOPHEN 5-325 MG PO TABS: 1.0000 | ORAL_TABLET | Freq: Every evening | ORAL | Status: DC | PRN

## 2014-03-31 NOTE — Telephone Encounter (Signed)
Please print #60 no refill. Will get a UDS at the next opportunity

## 2014-03-31 NOTE — Telephone Encounter (Signed)
Caller name: florence  Relation to pt: wife Call back number: [phone] Pharmacy:  Reason for call:   Requesting a refill of hydrocodone

## 2014-03-31 NOTE — Telephone Encounter (Signed)
Pt is requesting refill on Hydrocodone.  Last OV: 02/05/2014 Last Fill: 02/05/2014 # [phone] 0RF  Please advise.

## 2014-03-31 NOTE — Telephone Encounter (Signed)
Rx printed, awaiting signature by Dr. Paz.  

## 2014-04-01 NOTE — Telephone Encounter (Signed)
Spoke with Gene Hill, informed her Rx is ready for pick up at front desk.

## 2014-05-17 ENCOUNTER — Encounter: Payer: Self-pay | Admitting: Internal Medicine

## 2014-05-17 ENCOUNTER — Ambulatory Visit (INDEPENDENT_AMBULATORY_CARE_PROVIDER_SITE_OTHER): Payer: PPO | Admitting: Internal Medicine

## 2014-05-17 VITALS — BP 126/74 | HR 84 | Temp 98.1°F | Ht 67.0 in | Wt 223.4 lb

## 2014-05-17 DIAGNOSIS — R42 Dizziness and giddiness: Secondary | ICD-10-CM

## 2014-05-17 DIAGNOSIS — K2270 Barrett's esophagus without dysplasia: Secondary | ICD-10-CM

## 2014-05-17 DIAGNOSIS — I10 Essential (primary) hypertension: Secondary | ICD-10-CM | POA: Diagnosis not present

## 2014-05-17 MED ORDER — HYDROCODONE-ACETAMINOPHEN 5-325 MG PO TABS: 1.0000 | ORAL_TABLET | Freq: Every evening | ORAL | Status: DC | PRN

## 2014-05-17 MED ORDER — MECLIZINE HCL 12.5 MG PO TABS: 12.5000 mg | ORAL_TABLET | Freq: Three times a day (TID) | ORAL | Status: DC | PRN

## 2014-05-17 NOTE — Patient Instructions (Addendum)
Rest, fluids , tylenol If  cough, take Mucinex DM twice a day as needed  If nasal  congestion use OTC Nasocort or Flonase : 2 nasal sprays on each side of the nose daily until you feel better  Call if not gradually better over the next  10 days Call anytime if the symptoms are severe  Antivert as needed for dizziness, call if anything changes  Come back to the office in 4 months   for a routine check up

## 2014-05-17 NOTE — Assessment & Plan Note (Signed)
BP well-controlled, 126/74 today. Previous switch from metoprolol to carvedilol yielded no changes to dizziness. Plan: Continue carvedilol at current regimen.

## 2014-05-17 NOTE — Progress Notes (Signed)
Subjective:    Patient ID: Gene Hill, male    DOB: 1940-12-05, 74 y.o.   MRN: [id number]  DOS:  05/17/2014 Type of visit - description :  Interval history:  Patient is a 74 year old male with a history of hypertension, arthritis, and Barrett's esophagus in today for follow up care. On last appointment, patient mentioned episodes of dizziness which occurred most frequently after standing up. Patient was switched from metoprolol to carvedilol to see if a change in drugs would improve the dizziness. Patient states today that dizziness has not improved. It continues to occur sometimes after standing up and is occasionally accompanied by nausea. Not accompanied by any chest pain, palpitations, SOB, headaches, diplopia, or slurred speech. Patient denies any history of falls.   Patient mentions recent URI with persistent symptoms of cough and congestion. States that URI has resolved but these two symptoms remain.  Patient states Barrett's esophagus is well-controlled with current medications, patient denies any reflux, dysphagia, or odynophagia.    Review of Systems  Constitutional: No fever, chills. Respiratory: No wheezing or difficulty breathing. Cough , no mucus production. Mild congestion. Cardiovascular: No CP, leg swelling or palpitations GI: occasional nausea, no vomiting, diarrhea or abdominal pain.  No blood in the stools. No dysphagia or odynophagia. GU: No dysuria, gross hematuria, difficulty urinating. No urinary urgency or frequency. Neurological: As per HPI. No syncope. No headaches. No diplopia, slurred speech, motor deficits, facial numbness    Past Medical History  Diagnosis Date  . GERD (gastroesophageal reflux disease)   . Arthritis   . 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 HYPERTENSION 05/03/2006  . CORONARY ARTERY DISEASE 05/03/2006    cath 1998, medical managment, cardiolite neg 3-07  . BARRETTS ESOPHAGUS 08/18/2008    per EGD 6-10   . 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 HYPERLIPIDEMIA 05/03/2006  . Carotid bruit     3-11 carotid u/s was  (-)  . 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 Blood transfusion     Past Surgical History  Procedure Laterality Date  . Rotator cuff repair      right  . Knee surgery  1990    right  . Spinal fusion    . Cervical fusion      History   Social History  . Marital Status: Married    Spouse Name: N/A  . Number of Children: 4  . Years of Education: N/A   Occupational History  . retired    Social History Main Topics  . Smoking status: Former Smoker -- 0.25 packs/day for 20 years    Types: Cigarettes    Start date: 04/28/1954    Quit date: 11/28/1974  . Smokeless tobacco: Never Used     Comment: quit 35 years ago, 1976  . Alcohol Use: Yes     Comment: less than 1 per day   . Drug Use: No  . Sexual Activity: Not on file   Other Topics Concern  . Not on file   Social History Narrative   Household-- pt, wife, son        Medication List       This list is accurate as of: 05/17/14  9:05 AM.  Always use your most recent med list.               ALEVE PO  Take 2 tablets by mouth 2 (two) times  daily as needed.     aspirin EC 325 MG tablet  Take by mouth. Take 325 mg by mouth daily.     carvedilol 6.25 MG tablet  Commonly known as:  COREG  Take 1 tablet (6.25 mg total) by mouth 2 (two) times daily with a meal.     Cinnamon 500 MG capsule  Take 500 mg by mouth 2 (two) times daily.     fish oil-omega-3 fatty acids 1000 MG capsule  Take 2 g by mouth daily.     HYDROcodone-acetaminophen 5-325 MG per tablet  Commonly known as:  NORCO/VICODIN  Take 1-2 tablets by mouth at bedtime as needed.     hydrocortisone-pramoxine 2.5-1 % rectal cream  Commonly known as:  ANALPRAM HC  Place 1 application rectally 3 (three) times daily.     meclizine 12.5 MG tablet  Commonly known as:  ANTIVERT  Take 1 tablet (12.5 mg total) by mouth 3  (three) times daily as needed for dizziness.     metoCLOPramide 10 MG tablet  Commonly known as:  REGLAN  Take 1 tablet (10 mg total) by mouth 4 (four) times daily -  before meals and at bedtime.     multivitamin capsule  Take 1 capsule by mouth daily.     niacin 500 MG tablet  Take 500 mg by mouth at bedtime.     pantoprazole 40 MG tablet  Commonly known as:  PROTONIX  Take 1 tablet (40 mg total) by mouth daily.     PROBIOTIC PO  Take by mouth.     pyridOXINE 100 MG tablet  Commonly known as:  VITAMIN B-6  Take 100 mg by mouth daily.     simvastatin 40 MG tablet  Commonly known as:  ZOCOR  Take 1 tablet (40 mg total) by mouth at bedtime.     TYLENOL PO  Take 2 tablets by mouth at bedtime.           Objective:   Physical Exam BP 126/74 mmHg  Pulse 84  Temp(Src) 98.1 F (36.7 C) (Oral)  Ht 5' 7"  (1.702 m)  Wt 223 lb 6 oz (101.322 kg)  BMI 34.98 kg/m2  SpO2 94%  General:   Well developed, well nourished . NAD.  HEENT:  Normocephalic . Face symmetric, atraumatic. EOMI.nose not congested, throat no red  Carotids: Good pulse, no bruit TMs partially seen d/t wax but clear  . Lungs:  CTA B Normal respiratory effort, no intercostal retractions, no accessory muscle use. Heart: RRR,  no murmur. Distal pulses intact. Abdomen:  Not distended, soft, non-tender. No rebound or rigidity. No mass,organomegaly Muscle skeletal: no pretibial edema bilaterally  Skin: Not pale. Not jaundice Neurologic:  alert & oriented X3.  Speech normal, gait appropriate for age and unassisted DTRs symmetric (decreased at LEs) Negative Romberg Psych--  Cognition and judgment appear intact.  Cooperative with normal attention span and concentration.  Behavior appropriate. No anxious or depressed appearing.      Assessment & Plan:  MM: Care managed by Dr. Marin Olp.   URI: Patient mentions recent bout of URI a couple of weeks ago with mild persisting symptoms.  Plan: Patient  instructed to take Mucinex DM twice a day as needed for cough, and OTC Nasocort or Flonase for nasal congestion. Patient instructed to let us know if symptoms are not improved in the next ten days and to call us if any symptoms worsen.

## 2014-05-17 NOTE — Assessment & Plan Note (Signed)
Barrett's: Well-controlled on current regimen of medications, no dysphagia or odynophagia. Plan: Continue current regimen.

## 2014-05-17 NOTE — Progress Notes (Signed)
Pre visit review using our clinic review tool, if applicable. No additional management support is needed unless otherwise documented below in the visit note. 

## 2014-05-17 NOTE — Assessment & Plan Note (Signed)
Dizziness: Switch from metoprolol to carvedilol yielded no changes to dizziness. Patient reports dizziness is worst after standing up. Plan: Patient declined neurology referral for evaluation, patient declined head MRI to determine etiology, and patient declined physical therapy referral for vestibular rehab.  Will prescribe Antivert for vertigo treatment. Patient instructed to return in 4 months for a routine check up or sooner if anything changes or worsens.

## 2014-05-18 ENCOUNTER — Other Ambulatory Visit: Payer: Self-pay

## 2014-05-18 DIAGNOSIS — C9000 Multiple myeloma not having achieved remission: Secondary | ICD-10-CM

## 2014-05-18 DIAGNOSIS — K219 Gastro-esophageal reflux disease without esophagitis: Secondary | ICD-10-CM

## 2014-05-18 MED ORDER — METOCLOPRAMIDE HCL 10 MG PO TABS: 10.0000 mg | ORAL_TABLET | Freq: Three times a day (TID) | ORAL | Status: DC

## 2014-05-24 ENCOUNTER — Telehealth: Payer: Self-pay | Admitting: Internal Medicine

## 2014-05-24 MED ORDER — MECLIZINE HCL 12.5 MG PO TABS: 12.5000 mg | ORAL_TABLET | Freq: Three times a day (TID) | ORAL | Status: DC | PRN

## 2014-05-24 NOTE — Telephone Encounter (Signed)
Refills sent. Pt informed he should be taking AS NEEDED ONLY. Pt verbalized understanding.

## 2014-05-24 NOTE — Telephone Encounter (Signed)
Okay #90, 2 refill, remind patient that is to be taken as needed only

## 2014-05-24 NOTE — Telephone Encounter (Signed)
Pt is requesting refill on Antivert. He takes 1 tablet po tid.  Last Filled on 05/17/2014 # 30 0 RF  Okay to refill?

## 2014-05-24 NOTE — Telephone Encounter (Signed)
Relation to pt: self Call back number: [phone] Pharmacy: [id number] - [place], [place] [phone] ([phone] (Fax)        Reason for call:  Pt requesting a refill meclizine (ANTIVERT) 12.5 MG tablet

## 2014-08-12 ENCOUNTER — Encounter: Payer: Self-pay | Admitting: Gastroenterology

## 2014-09-08 ENCOUNTER — Encounter: Payer: Self-pay | Admitting: Hematology & Oncology

## 2014-09-08 ENCOUNTER — Ambulatory Visit (HOSPITAL_BASED_OUTPATIENT_CLINIC_OR_DEPARTMENT_OTHER): Payer: PPO

## 2014-09-08 ENCOUNTER — Ambulatory Visit (HOSPITAL_BASED_OUTPATIENT_CLINIC_OR_DEPARTMENT_OTHER): Payer: PPO | Admitting: Hematology & Oncology

## 2014-09-08 VITALS — HR 78 | Temp 97.9°F | Resp 18 | Ht 67.0 in | Wt 227.0 lb

## 2014-09-08 DIAGNOSIS — K219 Gastro-esophageal reflux disease without esophagitis: Secondary | ICD-10-CM

## 2014-09-08 DIAGNOSIS — C9000 Multiple myeloma not having achieved remission: Secondary | ICD-10-CM

## 2014-09-08 LAB — COMPREHENSIVE METABOLIC PANEL (CC13)
ALT: 43 U/L (ref 0–55)
ANION GAP: 10 meq/L (ref 3–11)
AST: 32 U/L (ref 5–34)
Albumin: 3.7 g/dL (ref 3.5–5.0)
Alkaline Phosphatase: 43 U/L (ref 40–150)
BILIRUBIN TOTAL: 0.34 mg/dL (ref 0.20–1.20)
BUN: 14.3 mg/dL (ref 7.0–26.0)
CALCIUM: 9.6 mg/dL (ref 8.4–10.4)
CHLORIDE: 107 meq/L (ref 98–109)
CO2: 23 mEq/L (ref 22–29)
Creatinine: 1 mg/dL (ref 0.7–1.3)
EGFR: 77 mL/min/{1.73_m2} — AB (ref >90)
Glucose: 155 mg/dl — ABNORMAL HIGH (ref 70–140)
Potassium: 3.8 mEq/L (ref 3.5–5.1)
Sodium: 140 mEq/L (ref 136–145)
TOTAL PROTEIN: 7.5 g/dL (ref 6.4–8.3)

## 2014-09-08 LAB — CBC WITH DIFFERENTIAL (CANCER CENTER ONLY)
BASO#: 0 10*3/uL (ref 0.0–0.2)
BASO%: 0.7 % (ref 0.0–2.0)
EOS%: 2.6 % (ref 0.0–7.0)
Eosinophils Absolute: 0.1 10*3/uL (ref 0.0–0.5)
HEMATOCRIT: 35.7 % — AB (ref 38.7–49.9)
HGB: 11.9 g/dL — ABNORMAL LOW (ref 13.0–17.1)
LYMPH#: 1 10*3/uL (ref 0.9–3.3)
LYMPH%: 32 % (ref 14.0–48.0)
MCH: 34.3 pg — ABNORMAL HIGH (ref 28.0–33.4)
MCHC: 33.3 g/dL (ref 32.0–35.9)
MCV: 103 fL — ABNORMAL HIGH (ref 82–98)
MONO#: 0.3 10*3/uL (ref 0.1–0.9)
MONO%: 10.9 % (ref 0.0–13.0)
NEUT#: 1.6 10*3/uL (ref 1.5–6.5)
NEUT%: 53.8 % (ref 40.0–80.0)
Platelets: 142 10*3/uL — ABNORMAL LOW (ref 145–400)
RBC: 3.47 10*6/uL — AB (ref 4.20–5.70)
RDW: 14.3 % (ref 11.1–15.7)
WBC: 3 10*3/uL — AB (ref 4.0–10.0)

## 2014-09-08 NOTE — Progress Notes (Signed)
Hematology and Oncology Follow Up Visit  Gene Hill [id number] [birth year] 74 y.o. 09/08/2014   Principle Diagnosis:   IgA kappa myeloma   Current Therapy:    Observation     Interim History:  [name] is back for followup. Has been about 8 months since we last saw him. He has been seen at [facility]. He is followed at [facility] every 4 months.  He looks good. He feels good. He has been involved with his garden this summer. He is very kind and brought to [place] in  some vegetables. They look very tasty.   He's had no issues with infections.  He has had no cardiac issues.  He has had no issues with leg swelling. He has had no rashes. He has had no change in bowel or bladder habits.  We last saw him back in December, [name] was up slightly at 0.56 g/dL. g/L. His IgA level was 1260 mg/dL. Kappa light chain was 2.21m/dL  Medications:  Current outpatient prescriptions:  .  aspirin EC 325 MG tablet, Take by mouth. Take 325 mg by mouth daily., Disp: , Rfl:  .  Cinnamon 500 MG capsule, Take 500 mg by mouth 2 (two) times daily., Disp: , Rfl:  .  fish oil-omega-3 fatty acids 1000 MG capsule, Take 2 g by mouth daily., Disp: , Rfl:  .  HYDROcodone-acetaminophen (NORCO/VICODIN) 5-325 MG per tablet, Take 1-2 tablets by mouth at bedtime as needed., Disp: 60 tablet, Rfl: 0 .  hydrocortisone-pramoxine (ANALPRAM HC) 2.5-1 % rectal cream, Place 1 application rectally 3 (three) times daily., Disp: 30 g, Rfl: 0 .  meclizine (ANTIVERT) 12.5 MG tablet, Take 1 tablet (12.5 mg total) by mouth 3 (three) times daily as needed for dizziness., Disp: 90 tablet, Rfl: 2 .  metoCLOPramide (REGLAN) 10 MG tablet, Take 1 tablet (10 mg total) by mouth 4 (four) times daily -  before meals and at bedtime., Disp: 120 tablet, Rfl: 3 .  Naproxen Sodium (ALEVE PO), Take 2 tablets by mouth 2 (two) times daily as needed. , Disp: , Rfl:  .  niacin 500 MG tablet, Take 500 mg by mouth at bedtime., Disp: , Rfl:  .  pantoprazole  (PROTONIX) 40 MG tablet, Take 1 tablet (40 mg total) by mouth daily., Disp: 90 tablet, Rfl: 3 .  Probiotic Product (PROBIOTIC PO), Take by mouth.  , Disp: , Rfl:  .  pyridOXINE (VITAMIN B-6) 100 MG tablet, Take 100 mg by mouth daily., Disp: , Rfl:  .  simvastatin (ZOCOR) 40 MG tablet, Take 1 tablet (40 mg total) by mouth at bedtime., Disp: 90 tablet, Rfl: 3  Allergies: No Known Allergies  Past Medical History, Surgical history, Social history, and Family History were reviewed and updated.  Review of Systems: As above  Physical Exam:  height is _0  (1.702 m) and weight is 227 lb (102.967 kg). His oral temperature is 97.9 F (36.6 C). His pulse is 78. His respiration is 18.  Well-developed and well-nourished white gentleman. Head and neck exam shows no ocular or oral lesions. He has no palpable lymph nodes. Lungs are clear. Cardiac exam regular rate and rhythm with no murmurs rubs or bruits. Abdomen is soft. He's mildly obese. He has good bowel sounds. There is no fluid wave. There is no palpable liver or spleen tip. Back exam shows no tenderness over the spine ribs or hips. Extremities shows no clubbing cyanosis or edema. Has good range of motion of his joints.  Has good muscle strength. Skin exam no rashes. Neurological exam is nonfocal.  Lab Results  Component Value Date   WBC 3.0* 09/08/2014   HGB 11.9* 09/08/2014   HCT 35.7* 09/08/2014   MCV 103* 09/08/2014   PLT 142* 09/08/2014     Chemistry      Component Value Date/Time   NA 140 09/08/2014 0948   NA 142 01/07/2014 1012   NA 141 04/27/2013 1432   K 3.8 09/08/2014 0948   K 4.1 01/07/2014 1012   K 4.0 04/27/2013 1432   CL 105 01/07/2014 1012   CL 103 04/27/2013 1432   CO2 23 09/08/2014 0948   CO2 27 01/07/2014 1012   CO2 26 04/27/2013 1432   BUN 14.3 09/08/2014 0948   BUN 15 01/07/2014 1012   BUN 15 04/27/2013 1432   CREATININE 1.0 09/08/2014 0948   CREATININE 1.1 01/07/2014 1012   CREATININE 0.85 04/27/2013 1432       Component Value Date/Time   CALCIUM 9.6 09/08/2014 0948   CALCIUM 9.9 01/07/2014 1012   CALCIUM 9.0 04/27/2013 1432   ALKPHOS 43 09/08/2014 0948   ALKPHOS 42 01/07/2014 1012   ALKPHOS 37* 04/27/2013 1432   AST 32 09/08/2014 0948   AST 33 01/07/2014 1012   AST 28 04/27/2013 1432   ALT 43 09/08/2014 0948   ALT 39 01/07/2014 1012   ALT 39 04/27/2013 1432   BILITOT 0.34 09/08/2014 0948   BILITOT 1.10 01/07/2014 1012   BILITOT 0.4 04/27/2013 1432       Impression and Plan: Gene Hill is a 74 year old gentleman with history of IgA kappa myeloma. 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 He was treated with standard therapy and got into remission. 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  We had him on maintenance therapy which he really had a tough time with. He's been off maintenance therapy now for close to 3 years. He really looks good. He has a very good quality of life right now.  I think that the these myeloma studies that we are checking today will be very key area and I think if [name] is higher, then we are going to have to reevaluate him. This would indicate a bone marrow biopsy. We probably would need to do a bone survey and possibly PET scan on him.  His blood counts are little bit lower. I suppose this could indicate more marrow activity.  For now, I will keep his next appointment at 8 months. He knows that we can certainly see him back sooner if we find that his myeloma studies are more abnormal.   [name], MD 8/17/20165:55 PM

## 2014-09-13 LAB — KAPPA/LAMBDA LIGHT CHAINS
Kappa free light chain: 5.16 mg/dL — ABNORMAL HIGH (ref 0.33–1.94)
Kappa:Lambda Ratio: 5.93 — ABNORMAL HIGH (ref 0.26–1.65)
LAMBDA FREE LGHT CHN: 0.87 mg/dL (ref 0.57–2.63)

## 2014-09-13 LAB — PROTEIN ELECTROPHORESIS, SERUM, WITH REFLEX
ABNORMAL PROTEIN BAND1: 0.8 g/dL
ABNORMAL PROTEIN BAND3: 0.3 g/dL
ALPHA-2-GLOBULIN: 0.7 g/dL (ref 0.5–0.9)
Abnormal Protein Band2: 0.6 g/dL
Albumin ELP: 3.7 g/dL — ABNORMAL LOW (ref 3.8–4.8)
Alpha-1-Globulin: 0.2 g/dL (ref 0.2–0.3)
BETA GLOBULIN: 0.4 g/dL (ref 0.4–0.6)
Beta 2: 1.3 g/dL — ABNORMAL HIGH (ref 0.2–0.5)
Gamma Globulin: 1.1 g/dL (ref 0.8–1.7)
Total Protein, Serum Electrophoresis: 7.4 g/dL (ref 6.1–8.1)

## 2014-09-13 LAB — IGG, IGA, IGM
IGA: 1790 mg/dL — AB (ref 68–379)
IgG (Immunoglobin G), Serum: 354 mg/dL — ABNORMAL LOW (ref 650–1600)
IgM, Serum: 7 mg/dL — ABNORMAL LOW (ref 41–251)

## 2014-09-13 LAB — IFE INTERPRETATION

## 2014-11-03 ENCOUNTER — Encounter: Payer: Self-pay | Admitting: Hematology & Oncology

## 2014-11-03 ENCOUNTER — Ambulatory Visit (HOSPITAL_BASED_OUTPATIENT_CLINIC_OR_DEPARTMENT_OTHER): Payer: PPO

## 2014-11-03 ENCOUNTER — Ambulatory Visit (HOSPITAL_BASED_OUTPATIENT_CLINIC_OR_DEPARTMENT_OTHER): Payer: PPO | Admitting: Hematology & Oncology

## 2014-11-03 ENCOUNTER — Ambulatory Visit (HOSPITAL_BASED_OUTPATIENT_CLINIC_OR_DEPARTMENT_OTHER)
Admission: RE | Admit: 2014-11-03 | Discharge: 2014-11-03 | Disposition: A | Discharge status: Home / Self Care | Payer: PPO | Source: Ambulatory Visit | Attending: Hematology & Oncology | Admitting: Hematology & Oncology

## 2014-11-03 VITALS — BP 156/79 | HR 77 | Temp 98.0°F | Resp 16 | Ht 67.0 in | Wt 228.0 lb

## 2014-11-03 DIAGNOSIS — C9000 Multiple myeloma not having achieved remission: Secondary | ICD-10-CM | POA: Diagnosis not present

## 2014-11-03 DIAGNOSIS — C9001 Multiple myeloma in remission: Secondary | ICD-10-CM

## 2014-11-03 DIAGNOSIS — C9002 Multiple myeloma in relapse: Secondary | ICD-10-CM

## 2014-11-03 LAB — COMPREHENSIVE METABOLIC PANEL (CC13)
ALBUMIN: 3.6 g/dL (ref 3.5–5.0)
ALK PHOS: 41 U/L (ref 40–150)
ALT: 49 U/L (ref 0–55)
AST: 35 U/L — AB (ref 5–34)
Anion Gap: 9 mEq/L (ref 3–11)
BUN: 15.1 mg/dL (ref 7.0–26.0)
CO2: 26 meq/L (ref 22–29)
Calcium: 9.6 mg/dL (ref 8.4–10.4)
Chloride: 106 mEq/L (ref 98–109)
Creatinine: 0.9 mg/dL (ref 0.7–1.3)
EGFR: 78 mL/min/{1.73_m2} — AB (ref >90)
GLUCOSE: 128 mg/dL (ref 70–140)
POTASSIUM: 4 meq/L (ref 3.5–5.1)
SODIUM: 141 meq/L (ref 136–145)
Total Bilirubin: 0.37 mg/dL (ref 0.20–1.20)
Total Protein: 7.9 g/dL (ref 6.4–8.3)

## 2014-11-03 LAB — CBC WITH DIFFERENTIAL (CANCER CENTER ONLY)
BASO#: 0 10*3/uL (ref 0.0–0.2)
BASO%: 0.5 % (ref 0.0–2.0)
EOS%: 2.4 % (ref 0.0–7.0)
Eosinophils Absolute: 0.1 10*3/uL (ref 0.0–0.5)
HCT: 37.1 % — ABNORMAL LOW (ref 38.7–49.9)
HEMOGLOBIN: 12.2 g/dL — AB (ref 13.0–17.1)
LYMPH#: 1.2 10*3/uL (ref 0.9–3.3)
LYMPH%: 33.3 % (ref 14.0–48.0)
MCH: 33.8 pg — AB (ref 28.0–33.4)
MCHC: 32.9 g/dL (ref 32.0–35.9)
MCV: 103 fL — ABNORMAL HIGH (ref 82–98)
MONO#: 0.6 10*3/uL (ref 0.1–0.9)
MONO%: 15.1 % — AB (ref 0.0–13.0)
NEUT%: 48.7 % (ref 40.0–80.0)
NEUTROS ABS: 1.8 10*3/uL (ref 1.5–6.5)
PLATELETS: 171 10*3/uL (ref 145–400)
RBC: 3.61 10*6/uL — AB (ref 4.20–5.70)
RDW: 14.7 % (ref 11.1–15.7)
WBC: 3.7 10*3/uL — AB (ref 4.0–10.0)

## 2014-11-03 NOTE — Progress Notes (Signed)
Hematology and Oncology Follow Up Visit  Gene Hill [id number] 1940/12/31 74 y.o. 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  Kolodziejski is back for followup. Unfortunately, it looks like he is relapsing. We have been following his myeloma studies for the past several months. His numbers keep going up./dL. Back in August, [name] was 0.8 g/dL. His IgA level was 1790 mg/dL. His Kappa Lightchain was 5.16 mg/dL.  He is feeling okay. He's had no problem with fatigue or weakness. His garden did very well this summer.  His wife, however, has had some issues. She had recent neck surgery. She had a disc issue.  When 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 However, he really responded nicely to treatment.  He is not complaining of any pain. He's had no change in bowel or bladder habits. He has had no cough. He's had no rashes area in  The fifth digit at the tip of the finger on his left hand is swollen. It is not red. It does not appear to be painful.  Overall, his performance status is ECOG 0.  Medications:  Current outpatient prescriptions:  .  aspirin EC 325 MG tablet, Take by mouth. Take 325 mg by mouth daily., Disp: , Rfl:  .  Cinnamon 500 MG capsule, Take 500 mg by mouth 2 (two) times daily., Disp: , Rfl:  .  fish oil-omega-3 fatty acids 1000 MG capsule, Take 2 g by mouth daily., Disp: , Rfl:  .  HYDROcodone-acetaminophen (NORCO/VICODIN) 5-325 MG per tablet, Take 1-2 tablets by mouth at bedtime as needed., Disp: 60 tablet, Rfl: 0 .  hydrocortisone-pramoxine (ANALPRAM HC) 2.5-1 % rectal cream, Place 1 application rectally 3 (three) times daily., Disp: 30 g, Rfl: 0 .  meclizine (ANTIVERT) 12.5 MG tablet, Take 1 tablet (12.5 mg total) by mouth 3 (three) times daily as needed for dizziness., Disp: 90 tablet, Rfl: 2 .   metoCLOPramide (REGLAN) 10 MG tablet, Take 1 tablet (10 mg total) by mouth 4 (four) times daily -  before meals and at bedtime., Disp: 120 tablet, Rfl: 3 .  Naproxen Sodium (ALEVE PO), Take 2 tablets by mouth 2 (two) times daily as needed. , Disp: , Rfl:  .  niacin 500 MG tablet, Take 500 mg by mouth at bedtime., Disp: , Rfl:  .  pantoprazole (PROTONIX) 40 MG tablet, Take 1 tablet (40 mg total) by mouth daily., Disp: 90 tablet, Rfl: 3 .  Probiotic Product (PROBIOTIC PO), Take by mouth.  , Disp: , Rfl:  .  pyridOXINE (VITAMIN B-6) 100 MG tablet, Take 100 mg by mouth daily., Disp: , Rfl:  .  simvastatin (ZOCOR) 40 MG tablet, Take 1 tablet (40 mg total) by mouth at bedtime., Disp: 90 tablet, Rfl: 3  Allergies: No Known Allergies  Past Medical History, Surgical history, Social history, and Family History were reviewed and updated.  Review of Systems: As above  Physical Exam:  height is 5' 7"  (1.702 m) and weight is 228 lb (103.42 kg). His oral temperature is 98 F (36.7 C). His blood pressure is 156/79 and his pulse is 77. His respiration is 16.  Well-developed and well-nourished white gentleman. Head and neck exam shows no ocular or oral lesions. He has no palpable lymph nodes. Lungs are clear. Cardiac  exam regular rate and rhythm with no murmurs rubs or bruits. Abdomen is soft. He's mildly obese. He has good bowel sounds. There is no fluid wave. There is no palpable liver or spleen tip. Back exam shows no tenderness over the spine ribs or hips. Extremities shows no clubbing cyanosis or edema. Has good range of motion of his joints. Has good muscle strength. Skin exam no rashes. Neurological exam is nonfocal.  Lab Results  Component Value Date   WBC 3.7* 11/03/2014   HGB 12.2* 11/03/2014   HCT 37.1* 11/03/2014   MCV 103* 11/03/2014   PLT 171 11/03/2014     Chemistry      Component Value Date/Time   NA 141 11/03/2014 1336   NA 142 01/07/2014 1012   NA 141 04/27/2013 1432   K 4.0  11/03/2014 1336   K 4.1 01/07/2014 1012   K 4.0 04/27/2013 1432   CL 105 01/07/2014 1012   CL 103 04/27/2013 1432   CO2 26 11/03/2014 1336   CO2 27 01/07/2014 1012   CO2 26 04/27/2013 1432   BUN 15.1 11/03/2014 1336   BUN 15 01/07/2014 1012   BUN 15 04/27/2013 1432   CREATININE 0.9 11/03/2014 1336   CREATININE 1.1 01/07/2014 1012   CREATININE 0.85 04/27/2013 1432      Component Value Date/Time   CALCIUM 9.6 11/03/2014 1336   CALCIUM 9.9 01/07/2014 1012   CALCIUM 9.0 04/27/2013 1432   ALKPHOS 41 11/03/2014 1336   ALKPHOS 42 01/07/2014 1012   ALKPHOS 37* 04/27/2013 1432   AST 35* 11/03/2014 1336   AST 33 01/07/2014 1012   AST 28 04/27/2013 1432   ALT 49 11/03/2014 1336   ALT 39 01/07/2014 1012   ALT 39 04/27/2013 1432   BILITOT 0.37 11/03/2014 1336   BILITOT 1.10 01/07/2014 1012   BILITOT 0.4 04/27/2013 1432       Impression and Plan: [name] is a 74 year old gentleman with history of IgA kappa myeloma. 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 He was treated with standard therapy and got into remission. 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  Again, it really looks like he is recurrent. His myeloma studies continued to show that the protein levels are advancing.  I think we have to reevaluate him. This will include a bone marrow biopsy. This will be important so that we can assess the cytogenetics. I talked to he and his wife about this. They agree to have this done.  I think he also needs a bone survey.we will see about getting this done today.  I think a PET scan will also be indicated. This is typically recommended for patients who relapse.  I think once we get all her studies done, we can consider him for single agent treatment with Pomalidomide. I think this would be reasonable. If we have to to add anything to Pomalidomide, then I would consider Kyprolis.The other option might be Daratumumab, which would also be  reasonable.  I suppose that we could consider Ninlaro since this is oral.  We will have to keep in mind cost as they are under a lot of financial pressure right now. This is very important to me is that we do not stress him out financially.  Thankfully, we have a lot of flexibility as to when we need to start.  I will plan to see him back a couple weeks after the bone marrow test at which point we should have  the results back from our studies.   I spent about 45 minutes with him today. I outlined all my recommendations. They have a very good understanding of what is going on.Mikey Kirschner, MD 10/12/20164:11 PM

## 2014-11-04 ENCOUNTER — Other Ambulatory Visit: Payer: Self-pay | Admitting: *Deleted

## 2014-11-04 ENCOUNTER — Telehealth: Payer: Self-pay | Admitting: *Deleted

## 2014-11-04 MED ORDER — DIAZEPAM 5 MG PO TABS: 5.0000 mg | ORAL_TABLET | Freq: Once | ORAL | Status: DC

## 2014-11-04 NOTE — Telephone Encounter (Signed)
-----   Message from [name], MD sent at 11/04/2014  1:47 PM EDT ----- Call - the bone survey does not show anything new.   The changes that are seen appear stable!!!  Gene Hill

## 2014-11-05 LAB — PROTEIN ELECTROPHORESIS, SERUM, WITH REFLEX
ABNORMAL PROTEIN BAND2: 0.7 g/dL
ABNORMAL PROTEIN BAND3: 0.4 g/dL
ALBUMIN ELP: 3.8 g/dL (ref 3.8–4.8)
ALPHA-1-GLOBULIN: 0.2 g/dL (ref 0.2–0.3)
Abnormal Protein Band1: 0.8 g/dL
Alpha-2-Globulin: 0.6 g/dL (ref 0.5–0.9)
Beta 2: 1.3 g/dL — ABNORMAL HIGH (ref 0.2–0.5)
Beta Globulin: 0.4 g/dL (ref 0.4–0.6)
GAMMA GLOBULIN: 1.3 g/dL (ref 0.8–1.7)
Total Protein, Serum Electrophoresis: 7.7 g/dL (ref 6.1–8.1)

## 2014-11-05 LAB — IGG, IGA, IGM
IgA: 1940 mg/dL — ABNORMAL HIGH (ref 68–379)
IgG (Immunoglobin G), Serum: 352 mg/dL — ABNORMAL LOW (ref 650–1600)

## 2014-11-05 LAB — IFE INTERPRETATION

## 2014-11-05 LAB — KAPPA/LAMBDA LIGHT CHAINS
KAPPA FREE LGHT CHN: 5.76 mg/dL — AB (ref 0.33–1.94)
KAPPA LAMBDA RATIO: 7.68 — AB (ref 0.26–1.65)
Lambda Free Lght Chn: 0.75 mg/dL (ref 0.57–2.63)

## 2014-11-16 ENCOUNTER — Telehealth: Payer: Self-pay | Admitting: Internal Medicine

## 2014-11-16 MED ORDER — METOPROLOL TARTRATE 100 MG PO TABS: 50.0000 mg | ORAL_TABLET | Freq: Two times a day (BID) | ORAL | Status: DC

## 2014-11-16 NOTE — Telephone Encounter (Signed)
Pt's wife, Bartolo Darter, is requesting refill on Metoprolol for Pt. Pt's last OV was 05/17/2014 and was informed to F/U in 4 months, Pt's next appt is CPE on 02/18/2015. Metoprolol has not been refilled since 04/20/2013 #90 tablets and 2 refills. Please advise.

## 2014-11-16 NOTE — Telephone Encounter (Signed)
Okay to prescribed for 4 months

## 2014-11-16 NOTE — Telephone Encounter (Signed)
Caller name: [place], [place] Relation to pt: spouse  Call back [place]: [id number] - , [address] [place] [phone] (Phone) [phone] (Fax)         Reason for call:  Spouse requesting a refill metoprolol (LOPRESSOR) 100 MG tablet. Patient has a schedule physical appointment for 02/18/2015

## 2014-11-16 NOTE — Telephone Encounter (Signed)
Rx sent 

## 2014-11-18 ENCOUNTER — Ambulatory Visit (HOSPITAL_COMMUNITY)
Admission: RE | Admit: 2014-11-18 | Discharge: 2014-11-18 | Disposition: A | Discharge status: Home / Self Care | Payer: PPO | Source: Ambulatory Visit | Attending: Hematology & Oncology | Admitting: Hematology & Oncology

## 2014-11-18 ENCOUNTER — Other Ambulatory Visit: Payer: Self-pay | Admitting: Hematology & Oncology

## 2014-11-18 DIAGNOSIS — I700 Atherosclerosis of aorta: Secondary | ICD-10-CM | POA: Insufficient documentation

## 2014-11-18 DIAGNOSIS — C9002 Multiple myeloma in relapse: Secondary | ICD-10-CM

## 2014-11-18 DIAGNOSIS — I2510 Atherosclerotic heart disease of native coronary artery without angina pectoris: Secondary | ICD-10-CM | POA: Diagnosis not present

## 2014-11-18 DIAGNOSIS — C9000 Multiple myeloma not having achieved remission: Secondary | ICD-10-CM | POA: Insufficient documentation

## 2014-11-18 MED ORDER — FLUDEOXYGLUCOSE F - 18 (FDG) INJECTION
11.4000 | Freq: Once | INTRAVENOUS | Status: DC | PRN
  Administered 2014-11-18: 11.33 via INTRAVENOUS
  Filled 2014-11-18: qty 11.4

## 2014-11-19 LAB — GLUCOSE, CAPILLARY: GLUCOSE-CAPILLARY: 97 mg/dL (ref 65–99)

## 2014-11-22 ENCOUNTER — Other Ambulatory Visit: Payer: Self-pay | Admitting: Hematology & Oncology

## 2014-11-22 DIAGNOSIS — C9002 Multiple myeloma in relapse: Secondary | ICD-10-CM

## 2014-11-23 ENCOUNTER — Ambulatory Visit (HOSPITAL_COMMUNITY)
Admission: RE | Admit: 2014-11-23 | Discharge: 2014-11-23 | Disposition: A | Discharge status: Home / Self Care | Payer: PPO | Source: Ambulatory Visit | Attending: Hematology & Oncology | Admitting: Hematology & Oncology

## 2014-11-23 ENCOUNTER — Encounter (HOSPITAL_COMMUNITY): Payer: Self-pay

## 2014-11-23 VITALS — BP 132/80 | HR 77 | Temp 98.1°F | Resp 18 | Ht 67.0 in | Wt 215.0 lb

## 2014-11-23 DIAGNOSIS — D539 Nutritional anemia, unspecified: Secondary | ICD-10-CM | POA: Insufficient documentation

## 2014-11-23 DIAGNOSIS — D72819 Decreased white blood cell count, unspecified: Secondary | ICD-10-CM | POA: Diagnosis not present

## 2014-11-23 DIAGNOSIS — C9002 Multiple myeloma in relapse: Secondary | ICD-10-CM

## 2014-11-23 LAB — CBC WITH DIFFERENTIAL/PLATELET
Basophils Absolute: 0 10*3/uL (ref 0.0–0.1)
Basophils Relative: 1 %
Eosinophils Absolute: 0.1 10*3/uL (ref 0.0–0.7)
Eosinophils Relative: 3 %
HEMATOCRIT: 36.4 % — AB (ref 39.0–52.0)
HEMOGLOBIN: 11.7 g/dL — AB (ref 13.0–17.0)
LYMPHS ABS: 1.3 10*3/uL (ref 0.7–4.0)
Lymphocytes Relative: 39 %
MCH: 33.2 pg (ref 26.0–34.0)
MCHC: 32.1 g/dL (ref 30.0–36.0)
MCV: 103.4 fL — ABNORMAL HIGH (ref 78.0–100.0)
Monocytes Absolute: 0.5 10*3/uL (ref 0.1–1.0)
Monocytes Relative: 14 %
NEUTROS ABS: 1.5 10*3/uL — AB (ref 1.7–7.7)
NEUTROS PCT: 43 %
Platelets: 169 10*3/uL (ref 150–400)
RBC: 3.52 MIL/uL — ABNORMAL LOW (ref 4.22–5.81)
RDW: 15 % (ref 11.5–15.5)
WBC: 3.4 10*3/uL — ABNORMAL LOW (ref 4.0–10.5)

## 2014-11-23 LAB — BONE MARROW EXAM

## 2014-11-23 MED ORDER — SODIUM CHLORIDE 0.9 % IV SOLN
Freq: Once | INTRAVENOUS | Status: AC
  Administered 2014-11-23: 07:00:00 via INTRAVENOUS

## 2014-11-23 MED ORDER — MIDAZOLAM HCL 5 MG/ML IJ SOLN
5.0000 mg | Freq: Once | INTRAMUSCULAR | Status: DC
  Filled 2014-11-23: qty 1

## 2014-11-23 MED ORDER — MEPERIDINE HCL 50 MG/ML IJ SOLN
50.0000 mg | Freq: Once | INTRAMUSCULAR | Status: DC
  Filled 2014-11-23: qty 1

## 2014-11-23 MED ORDER — MIDAZOLAM HCL 10 MG/2ML IJ SOLN: 5.0000 mg | Freq: Once | INTRAMUSCULAR | Status: DC

## 2014-11-23 MED ORDER — MEPERIDINE HCL 25 MG/ML IJ SOLN
INTRAMUSCULAR | Status: AC | PRN
  Administered 2014-11-23: 25 mg via INTRAVENOUS

## 2014-11-23 MED ORDER — MIDAZOLAM HCL 5 MG/5ML IJ SOLN
INTRAMUSCULAR | Status: AC | PRN
  Administered 2014-11-23: 2.5 mg via INTRAVENOUS

## 2014-11-23 NOTE — Discharge Instructions (Signed)
Bone Marrow Aspiration and Bone Marrow Biopsy, Care After Refer to this sheet in the next few weeks. These instructions provide you with information about caring for yourself after your procedure. Your health care provider may also give you more specific instructions. Your treatment has been planned according to current medical practices, but problems sometimes occur. Call your health care provider if you have any problems or questions after your procedure. WHAT TO EXPECT AFTER THE PROCEDURE After your procedure, it is common to have:  Soreness or tenderness around the puncture site.  Bruising. HOME CARE INSTRUCTIONS  Take medicines only as directed by your health care provider.  Follow your health care provider's instructions about:  Puncture site care.  Bandage (dressing) changes and removal.  Bathe and shower as directed by your health care provider.  Check your puncture site every day for signs of infection. Watch for:  Redness, swelling, or pain.  Fluid, blood, or pus.  Return to your normal activities as directed by your health care provider.  Keep all follow-up visits as directed by your health care provider. This is important. SEEK MEDICAL CARE IF:  You have a fever.  You have uncontrollable bleeding.  You have redness, swelling, or pain at the site of your puncture.  You have fluid, blood, or pus coming from your puncture site.   This information is not intended to replace advice given to you by your health care provider. Make sure you discuss any questions you have with your health care provider.   Document Released: 07/28/2004 Document Revised: 05/25/2014 Document Reviewed: 12/30/2013 Elsevier Interactive Patient Education 2016 Elsevier Inc. Moderate Conscious Sedation, Adult, Care After Refer to this sheet in the next few weeks. These instructions provide you with information on caring for yourself after your procedure. Your health care provider may also give  you more specific instructions. Your treatment has been planned according to current medical practices, but problems sometimes occur. Call your health care provider if you have any problems or questions after your procedure. WHAT TO EXPECT AFTER THE PROCEDURE  After your procedure:  You may feel sleepy, clumsy, and have poor balance for several hours.  Vomiting may occur if you eat too soon after the procedure. HOME CARE INSTRUCTIONS  Do not participate in any activities where you could become injured for at least 24 hours. Do not:  Drive.  Swim.  Ride a bicycle.  Operate heavy machinery.  Cook.  Use power tools.  Climb ladders.  Work from a high place.  Do not make important decisions or sign legal documents until you are improved.  If you vomit, drink water, juice, or soup when you can drink without vomiting. Make sure you have little or no nausea before eating solid foods.  Only take over-the-counter or prescription medicines for pain, discomfort, or fever as directed by your health care provider.  Make sure you and your family fully understand everything about the medicines given to you, including what side effects may occur.  You should not drink alcohol, take sleeping pills, or take medicines that cause drowsiness for at least 24 hours.  If you smoke, do not smoke without supervision.  If you are feeling better, you may resume normal activities 24 hours after you were sedated.  Keep all appointments with your health care provider. SEEK MEDICAL CARE IF:  Your skin is pale or bluish in color.  You continue to feel nauseous or vomit.  Your pain is getting worse and is not helped by medicine.  You have bleeding or swelling.  You are still sleepy or feeling clumsy after 24 hours. SEEK IMMEDIATE MEDICAL CARE IF:  You develop a rash.  You have difficulty breathing.  You develop any type of allergic problem.  You have a fever. MAKE SURE YOU:  Understand  these instructions.  Will watch your condition.  Will get help right away if you are not doing well or get worse.   This information is not intended to replace advice given to you by your health care provider. Make sure you discuss any questions you have with your health care provider.   Document Released: 10/29/2012 Document Revised: 01/29/2014 Document Reviewed: 10/29/2012 Elsevier Interactive Patient Education [employer].

## 2014-11-23 NOTE — Procedures (Signed)
Gene Hill was brought to the short stay unit at [facility] for a bone marrow biopsy and aspirate. This was done for relapsed myeloma.  He had an IV placed into his left hand.  We did the appropriate timeout procedure at 7:45 AM.  His Mallampati score was 2. His ASA class is 1.  He was then placed onto his right side. We gave him a total of 2.5 mg of Versed and 25 mg of Demerol IV for sedation.  The left posterior iliac crest region was prepped and draped in sterile fashion. 5 mL of 1% lidocaine was able to get under the skin and down to the periosteum.  With a scalpel, we made an incision into the skin.  With the combination biopsy and aspirate needle, we obtained 2 aspirates. One was sent for flow cytometry and cytogenetics.  We then obtained an excellent bone marrow biopsy core.  The patient tolerated the procedure without difficulties. There were no complications. We cleaned and dressed the procedure site sterilely.  I got his wife. I talked to her about the procedure and when the results will be back. We brought her back to the room that Gene Hill was in.  Lum Keas

## 2014-11-30 LAB — CHROMOSOME ANALYSIS, BONE MARROW

## 2014-11-30 LAB — TISSUE HYBRIDIZATION (BONE MARROW)-NCBH

## 2014-12-08 ENCOUNTER — Ambulatory Visit (HOSPITAL_BASED_OUTPATIENT_CLINIC_OR_DEPARTMENT_OTHER): Payer: PPO

## 2014-12-08 ENCOUNTER — Ambulatory Visit (HOSPITAL_BASED_OUTPATIENT_CLINIC_OR_DEPARTMENT_OTHER): Payer: PPO | Admitting: Hematology & Oncology

## 2014-12-08 ENCOUNTER — Encounter: Payer: Self-pay | Admitting: Hematology & Oncology

## 2014-12-08 VITALS — BP 130/81 | HR 75 | Temp 98.1°F | Resp 16 | Ht 67.0 in | Wt 227.0 lb

## 2014-12-08 DIAGNOSIS — C9002 Multiple myeloma in relapse: Secondary | ICD-10-CM

## 2014-12-08 DIAGNOSIS — Z9484 Stem cells transplant status: Secondary | ICD-10-CM | POA: Diagnosis not present

## 2014-12-08 LAB — CBC WITH DIFFERENTIAL (CANCER CENTER ONLY)
BASO#: 0 10*3/uL (ref 0.0–0.2)
BASO%: 0.5 % (ref 0.0–2.0)
EOS%: 3 % (ref 0.0–7.0)
Eosinophils Absolute: 0.1 10*3/uL (ref 0.0–0.5)
HCT: 36.4 % — ABNORMAL LOW (ref 38.7–49.9)
HGB: 12 g/dL — ABNORMAL LOW (ref 13.0–17.1)
LYMPH#: 1.3 10*3/uL (ref 0.9–3.3)
LYMPH%: 34.1 % (ref 14.0–48.0)
MCH: 33.7 pg — AB (ref 28.0–33.4)
MCHC: 33 g/dL (ref 32.0–35.9)
MCV: 102 fL — AB (ref 82–98)
MONO#: 0.6 10*3/uL (ref 0.1–0.9)
MONO%: 15.8 % — ABNORMAL HIGH (ref 0.0–13.0)
NEUT#: 1.7 10*3/uL (ref 1.5–6.5)
NEUT%: 46.6 % (ref 40.0–80.0)
PLATELETS: 155 10*3/uL (ref 145–400)
RBC: 3.56 10*6/uL — ABNORMAL LOW (ref 4.20–5.70)
RDW: 14.7 % (ref 11.1–15.7)
WBC: 3.7 10*3/uL — ABNORMAL LOW (ref 4.0–10.0)

## 2014-12-08 MED ORDER — IXAZOMIB CITRATE 3 MG PO CAPS: 3.0000 mg | ORAL_CAPSULE | ORAL | Status: DC

## 2014-12-08 MED ORDER — POMALIDOMIDE 3 MG PO CAPS: 3.0000 mg | ORAL_CAPSULE | Freq: Every day | ORAL | Status: DC

## 2014-12-08 NOTE — Progress Notes (Signed)
Hematology and Oncology Follow Up Visit  ADITHYA DIFRANCESCO [id number] 1940-06-27 74 y.o. 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  Lagrow is back for followup. Unfortunately, he is relapsing. P Raffety go ahead and get a bone marrow biopsy on him. This is done on November 1. The pathology report ([id number]) shows 42% plasma cells. There were atypical-appearing plasma cells. There were some clusters and sheets.  We did do cytogenetics. 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  We did do a PET scan on him. It showed that he had activity in a right second rib and distal left femur.  His last myeloma studies done in October showed 3 monoclonal bands. One band measured 0.8 g/dL. A second band measured 0.7 g/dL and a third band measured 0.4 g/dL. His IgA level was 1940 mg/dL. His Kappa Lightchain was 5.760 g/dL.  He still feels well. He is in no pain. He's had no fever. He's had no weight loss wakening. He's had no change in bowel or bladder habits.  He is still very active with his garden.  Overall, his performance status is ECOG 1.   Medications:  Current outpatient prescriptions:  .  aspirin EC 325 MG tablet, Take 325 mg by mouth daily. , Disp: , Rfl:  .  beta carotene w/minerals (OCUVITE) tablet, Take 1 tablet by mouth daily., Disp: , Rfl:  .  Cholecalciferol (VITAMIN D3) 5000 UNITS TABS, Take 5,000 Units by mouth daily., Disp: , Rfl:  .  Cinnamon 500 MG capsule, Take 500 mg by mouth daily. , Disp: , Rfl:  .  HYDROcodone-acetaminophen (NORCO/VICODIN) 5-325 MG per tablet, Take 1-2 tablets by mouth at bedtime as needed., Disp: 60 tablet, Rfl: 0 .  hydrocortisone-pramoxine (ANALPRAM HC) 2.5-1 % rectal cream, Place 1 application rectally 3 (three) times daily. (Patient taking differently: Place 1  application rectally 3 (three) times daily as needed for hemorrhoids. ), Disp: 30 g, Rfl: 0 .  metoprolol (LOPRESSOR) 100 MG tablet, Take 0.5 tablets (50 mg total) by mouth 2 (two) times daily., Disp: 30 tablet, Rfl: 3 .  Multiple Vitamin (MULTIVITAMIN WITH MINERALS) TABS tablet, Take 1 tablet by mouth daily., Disp: , Rfl:  .  naproxen sodium (ANAPROX) 220 MG tablet, Take 220 mg by mouth 3 (three) times daily with meals., Disp: , Rfl:  .  niacin 500 MG tablet, Take 500 mg by mouth daily. , Disp: , Rfl:  .  Omega-3 Fatty Acids (FISH OIL) 1200 MG CAPS, Take 1,200 mg by mouth daily., Disp: , Rfl:  .  pantoprazole (PROTONIX) 40 MG tablet, Take 1 tablet (40 mg total) by mouth daily., Disp: 90 tablet, Rfl: 3 .  Probiotic Product (PROBIOTIC PO), Take 1 tablet by mouth daily. , Disp: , Rfl:  .  pyridOXINE (VITAMIN B-6) 100 MG tablet, Take 100 mg by mouth daily., Disp: , Rfl:  .  simvastatin (ZOCOR) 40 MG tablet, Take 1 tablet (40 mg total) by mouth at bedtime., Disp: 90 tablet, Rfl: 3 .  ixazomib citrate (NINLARO) 3 MG capsule, Take 1 capsule (3 mg total) by mouth once a week. Take on an empty stomach 1hr before or 2hrs after food. Do not crush, chew, or open., Disp: 3 capsule, Rfl: 6 .  pomalidomide (POMALYST) 3 MG capsule,  Take 1 capsule (3 mg total) by mouth daily. Take with water on days 1-21. Repeat every 28 days., Disp: 21 capsule, Rfl: 4  Allergies: No Known Allergies  Past Medical History, Surgical history, Social history, and Family History were reviewed and updated.  Review of Systems: As above  Physical Exam:  height is 5' 7"  (1.702 m) and weight is 227 lb (102.967 kg). His oral temperature is 98.1 F (36.7 C). His blood pressure is 130/81 and his pulse is 75. His respiration is 16.  Well-developed and well-nourished white gentleman. Head and neck exam shows no ocular or oral lesions. He has no palpable lymph nodes. Lungs are clear. Cardiac exam regular rate and rhythm with no murmurs rubs  or bruits. Abdomen is soft. He's mildly obese. He has good bowel sounds. There is no fluid wave. There is no palpable liver or spleen tip. Back exam shows no tenderness over the spine ribs or hips. Extremities shows no clubbing cyanosis or edema. Has good range of motion of his joints. Has good muscle strength. Skin exam no rashes. Neurological exam is nonfocal.  Lab Results  Component Value Date   WBC 3.7* 12/08/2014   HGB 12.0* 12/08/2014   HCT 36.4* 12/08/2014   MCV 102* 12/08/2014   PLT 155 12/08/2014     Chemistry      Component Value Date/Time   NA 141 11/03/2014 1336   NA 142 01/07/2014 1012   NA 141 04/27/2013 1432   K 4.0 11/03/2014 1336   K 4.1 01/07/2014 1012   K 4.0 04/27/2013 1432   CL 105 01/07/2014 1012   CL 103 04/27/2013 1432   CO2 26 11/03/2014 1336   CO2 27 01/07/2014 1012   CO2 26 04/27/2013 1432   BUN 15.1 11/03/2014 1336   BUN 15 01/07/2014 1012   BUN 15 04/27/2013 1432   CREATININE 0.9 11/03/2014 1336   CREATININE 1.1 01/07/2014 1012   CREATININE 0.85 04/27/2013 1432      Component Value Date/Time   CALCIUM 9.6 11/03/2014 1336   CALCIUM 9.9 01/07/2014 1012   CALCIUM 9.0 04/27/2013 1432   ALKPHOS 41 11/03/2014 1336   ALKPHOS 42 01/07/2014 1012   ALKPHOS 37* 04/27/2013 1432   AST 35* 11/03/2014 1336   AST 33 01/07/2014 1012   AST 28 04/27/2013 1432   ALT 49 11/03/2014 1336   ALT 39 01/07/2014 1012   ALT 39 04/27/2013 1432   BILITOT 0.37 11/03/2014 1336   BILITOT 1.10 01/07/2014 1012   BILITOT 0.4 04/27/2013 1432       Impression and Plan: Mr. Bram is a 74 year old gentleman with history of IgA kappa myeloma. 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 He was treated with standard therapy and got into remission. 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  He has recurred. I think the bone marrow clearly shows that he has recurrence. His myeloma studies have continued to trend up forward.  I think that it  would be reasonable to try him on an all oral therapy. I believe that Ninlaro along with Pomalidomide would be reasonable.  I he will also need Zometa. With the bone lesions noted on PET scan, I think Zometa will also help.  At this point, we'll have to get started emergently. I the week and probably wait until after the holidays as to try to treat him. I think this would be reasonable.  We will we will have to see how much this will  cost. As always, this will be an issue. If, for some reason, the cost is prohibitive, then we will have to consider doing something in the office either subcutaneous or IV.  I gave them information sheets about each medication.  I will probably get him back in 2 or 3 weeks just for the Zometa.  I spent about 45 minutes with him today. I outlined all my recommendations. They have a very good understanding of what is going on.Mikey Kirschner, MD 11/16/20165:20 PM

## 2014-12-10 ENCOUNTER — Other Ambulatory Visit: Payer: Self-pay | Admitting: *Deleted

## 2014-12-10 ENCOUNTER — Telehealth: Payer: Self-pay | Admitting: Hematology & Oncology

## 2014-12-10 ENCOUNTER — Encounter (HOSPITAL_COMMUNITY): Payer: Self-pay

## 2014-12-10 DIAGNOSIS — C9002 Multiple myeloma in relapse: Secondary | ICD-10-CM

## 2014-12-10 LAB — COMPREHENSIVE METABOLIC PANEL
ALT: 37 U/L (ref 9–46)
AST: 28 U/L (ref 10–35)
Albumin: 3.6 g/dL (ref 3.6–5.1)
Alkaline Phosphatase: 35 U/L — ABNORMAL LOW (ref 40–115)
BUN: 16 mg/dL (ref 7–25)
CALCIUM: 9.3 mg/dL (ref 8.6–10.3)
CHLORIDE: 102 mmol/L (ref 98–110)
CO2: 23 mmol/L (ref 20–31)
Creatinine, Ser: 0.9 mg/dL (ref 0.70–1.18)
GLUCOSE: 123 mg/dL — AB (ref 65–99)
POTASSIUM: 3.8 mmol/L (ref 3.5–5.3)
Sodium: 137 mmol/L (ref 135–146)
Total Bilirubin: 0.3 mg/dL (ref 0.2–1.2)
Total Protein: 7.5 g/dL (ref 6.1–8.1)

## 2014-12-10 LAB — PROTEIN ELECTROPHORESIS, SERUM, WITH REFLEX
ABNORMAL PROTEIN BAND1: 0.7 g/dL
ABNORMAL PROTEIN BAND2: 0.6 g/dL
ABNORMAL PROTEIN BAND3: 0.3 g/dL
Albumin ELP: 3.7 g/dL — ABNORMAL LOW (ref 3.8–4.8)
Alpha-1-Globulin: 0.2 g/dL (ref 0.2–0.3)
Alpha-2-Globulin: 0.6 g/dL (ref 0.5–0.9)
Beta 2: 2 g/dL — ABNORMAL HIGH (ref 0.2–0.5)
Beta Globulin: 0.4 g/dL (ref 0.4–0.6)
Gamma Globulin: 0.5 g/dL — ABNORMAL LOW (ref 0.8–1.7)
TOTAL PROTEIN, SERUM ELECTROPHOR: 7.5 g/dL (ref 6.1–8.1)

## 2014-12-10 LAB — KAPPA/LAMBDA LIGHT CHAINS
Kappa free light chain: 6.42 mg/dL — ABNORMAL HIGH (ref 0.33–1.94)
Kappa:Lambda Ratio: 7.83 — ABNORMAL HIGH (ref 0.26–1.65)
Lambda Free Lght Chn: 0.82 mg/dL (ref 0.57–2.63)

## 2014-12-10 LAB — IGG, IGA, IGM
IgA: 2120 mg/dL — ABNORMAL HIGH (ref 68–379)
IgG (Immunoglobin G), Serum: 326 mg/dL — ABNORMAL LOW (ref 650–1600)
IgM, Serum: <5 mg/dL — ABNORMAL LOW (ref 41–251)

## 2014-12-10 LAB — BETA 2 MICROGLOBULIN, SERUM: Beta-2 Microglobulin: 2.13 mg/L (ref <=2.51)

## 2014-12-10 LAB — LACTATE DEHYDROGENASE: LDH: 115 U/L (ref 94–250)

## 2014-12-10 LAB — IFE INTERPRETATION

## 2014-12-10 MED ORDER — POMALIDOMIDE 3 MG PO CAPS: 3.0000 mg | ORAL_CAPSULE | Freq: Every day | ORAL | Status: DC

## 2014-12-10 NOTE — Telephone Encounter (Signed)
Pt is applying for the programs below, due to out of pocket co-pay for the Ninlaro 56m cap is $2,835.89.  Ninlaro 1Point - NOT ELIGIBLE P: [id number]: [id number]: [id number]  HWindsorCase: [id number]: [phone]: GFatima [place]through [place] Valid: 11/13/2014 - 11/12/2015 Pt is using [facility] for dispensing   [facility](PAN) - NO FUNDING P: [phone]  Multiple myeloma in relapse ([facility]  - Primary     C90.02       The Leukemia [facility] Lymphoma [facility] Co-Pay Assistance Program - APPROVED Co-Pay Assistance Program Contact [place] [phone]  [email or web address] Case: [id number]: Grant through  $10,000 Valid: 11/23/2014 - 11/22/2015 Pt is using [facility] for dispensing   [place]ANNED

## 2014-12-14 ENCOUNTER — Telehealth: Payer: Self-pay | Admitting: Hematology & Oncology

## 2014-12-14 NOTE — Telephone Encounter (Signed)
Josem Kaufmann Nbr: [id number]  Status: Approved Dates: 12/23/2014 -  06/23/2015  [id number] - Zometa    COPY SCANNED

## 2014-12-23 ENCOUNTER — Other Ambulatory Visit: Payer: Self-pay | Admitting: *Deleted

## 2014-12-23 ENCOUNTER — Encounter: Payer: Self-pay | Admitting: Hematology & Oncology

## 2014-12-23 DIAGNOSIS — C9002 Multiple myeloma in relapse: Secondary | ICD-10-CM

## 2014-12-23 MED ORDER — POMALIDOMIDE 3 MG PO CAPS: 3.0000 mg | ORAL_CAPSULE | Freq: Every day | ORAL | Status: DC

## 2014-12-29 ENCOUNTER — Telehealth: Payer: Self-pay | Admitting: *Deleted

## 2014-12-29 ENCOUNTER — Ambulatory Visit (HOSPITAL_BASED_OUTPATIENT_CLINIC_OR_DEPARTMENT_OTHER): Payer: PPO

## 2014-12-29 VITALS — BP 139/82 | HR 73 | Temp 97.9°F | Resp 20

## 2014-12-29 DIAGNOSIS — E349 Endocrine disorder, unspecified: Secondary | ICD-10-CM

## 2014-12-29 DIAGNOSIS — C9002 Multiple myeloma in relapse: Secondary | ICD-10-CM | POA: Diagnosis not present

## 2014-12-29 MED ORDER — ZOLEDRONIC ACID 4 MG/100ML IV SOLN
4.0000 mg | Freq: Once | INTRAVENOUS | Status: AC
  Administered 2014-12-29: 4 mg via INTRAVENOUS
  Filled 2014-12-29: qty 100

## 2014-12-29 NOTE — Telephone Encounter (Signed)
Received word from patients [facility] that his medication has been delivered today successfully

## 2014-12-29 NOTE — Patient Instructions (Signed)

## 2015-01-05 ENCOUNTER — Encounter: Payer: Self-pay | Admitting: Hematology & Oncology

## 2015-01-10 ENCOUNTER — Other Ambulatory Visit: Payer: Self-pay | Admitting: *Deleted

## 2015-01-10 DIAGNOSIS — C9002 Multiple myeloma in relapse: Secondary | ICD-10-CM

## 2015-01-10 MED ORDER — IXAZOMIB CITRATE 3 MG PO CAPS: 3.0000 mg | ORAL_CAPSULE | ORAL | Status: DC

## 2015-01-18 NOTE — Progress Notes (Signed)
Received fax documentation from [facility] that pt's Ninlaro shipped for delivery on 01/14/2015. dph

## 2015-01-19 ENCOUNTER — Other Ambulatory Visit: Payer: Self-pay | Admitting: *Deleted

## 2015-01-19 DIAGNOSIS — C9002 Multiple myeloma in relapse: Secondary | ICD-10-CM

## 2015-01-19 MED ORDER — POMALIDOMIDE 3 MG PO CAPS: 3.0000 mg | ORAL_CAPSULE | Freq: Every day | ORAL | Status: DC

## 2015-01-20 NOTE — Progress Notes (Signed)
Received fax from [facility] that pt's Pomalyst shipped for delivery on 12/28. dph

## 2015-02-01 ENCOUNTER — Other Ambulatory Visit: Payer: Self-pay | Admitting: Internal Medicine

## 2015-02-02 ENCOUNTER — Ambulatory Visit (HOSPITAL_BASED_OUTPATIENT_CLINIC_OR_DEPARTMENT_OTHER): Payer: PPO

## 2015-02-02 ENCOUNTER — Telehealth: Payer: Self-pay | Admitting: Hematology & Oncology

## 2015-02-02 ENCOUNTER — Ambulatory Visit (HOSPITAL_BASED_OUTPATIENT_CLINIC_OR_DEPARTMENT_OTHER): Payer: PPO | Admitting: Hematology & Oncology

## 2015-02-02 ENCOUNTER — Other Ambulatory Visit (HOSPITAL_BASED_OUTPATIENT_CLINIC_OR_DEPARTMENT_OTHER): Payer: PPO

## 2015-02-02 ENCOUNTER — Encounter: Payer: Self-pay | Admitting: Hematology & Oncology

## 2015-02-02 VITALS — BP 147/79 | HR 89 | Temp 97.7°F | Resp 16 | Ht 67.0 in | Wt 227.0 lb

## 2015-02-02 DIAGNOSIS — C9002 Multiple myeloma in relapse: Secondary | ICD-10-CM

## 2015-02-02 DIAGNOSIS — E349 Endocrine disorder, unspecified: Secondary | ICD-10-CM

## 2015-02-02 LAB — CMP (CANCER CENTER ONLY)
ALT(SGPT): 36 U/L (ref 10–47)
AST: 29 U/L (ref 11–38)
Albumin: 3.4 g/dL (ref 3.3–5.5)
Alkaline Phosphatase: 36 U/L (ref 26–84)
BUN: 16 mg/dL (ref 7–22)
CHLORIDE: 102 meq/L (ref 98–108)
CO2: 27 meq/L (ref 18–33)
CREATININE: 1 mg/dL (ref 0.6–1.2)
Calcium: 9.1 mg/dL (ref 8.0–10.3)
Glucose, Bld: 98 mg/dL (ref 73–118)
Potassium: 3.9 mEq/L (ref 3.3–4.7)
SODIUM: 138 meq/L (ref 128–145)
Total Bilirubin: 0.7 mg/dl (ref 0.20–1.60)
Total Protein: 7.7 g/dL (ref 6.4–8.1)

## 2015-02-02 LAB — CBC WITH DIFFERENTIAL (CANCER CENTER ONLY)
BASO#: 0 10*3/uL (ref 0.0–0.2)
BASO%: 1.3 % (ref 0.0–2.0)
EOS ABS: 0.1 10*3/uL (ref 0.0–0.5)
EOS%: 5.5 % (ref 0.0–7.0)
HCT: 36.6 % — ABNORMAL LOW (ref 38.7–49.9)
HGB: 11.9 g/dL — ABNORMAL LOW (ref 13.0–17.1)
LYMPH#: 1 10*3/uL (ref 0.9–3.3)
LYMPH%: 41.5 % (ref 14.0–48.0)
MCH: 33.2 pg (ref 28.0–33.4)
MCHC: 32.5 g/dL (ref 32.0–35.9)
MCV: 102 fL — ABNORMAL HIGH (ref 82–98)
MONO#: 0.4 10*3/uL (ref 0.1–0.9)
MONO%: 16.1 % — AB (ref 0.0–13.0)
NEUT#: 0.8 10*3/uL — ABNORMAL LOW (ref 1.5–6.5)
NEUT%: 35.6 % — AB (ref 40.0–80.0)
PLATELETS: 172 10*3/uL (ref 145–400)
RBC: 3.58 10*6/uL — ABNORMAL LOW (ref 4.20–5.70)
RDW: 14.9 % (ref 11.1–15.7)
WBC: 2.4 10*3/uL — AB (ref 4.0–10.0)

## 2015-02-02 MED ORDER — ZOLEDRONIC ACID 4 MG/100ML IV SOLN
4.0000 mg | Freq: Once | INTRAVENOUS | Status: AC
  Administered 2015-02-02: 4 mg via INTRAVENOUS
  Filled 2015-02-02: qty 100

## 2015-02-02 NOTE — Telephone Encounter (Signed)
Pt and wife in ofc today to bring updated income to fax to:  The [place] Assistance Program Contact us: [phone]   [email or web address] Case: [id number] Note: Grant through $10,000 Valid: 11/23/2014 - 11/22/2015  F: [id number]    COPY SCANNED

## 2015-02-02 NOTE — Progress Notes (Signed)
Hematology and Oncology Follow Up Visit  Gene Hill:[id number] 06/16/1940 75 y.o. 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 is back for followup. He started his chemotherapy. He is thankful that he can just take pills.   He does feel little tired. Otherwise, he's had no other process. He's had no nausea or vomiting. He's had no diarrhea. He's had little bit of constipation.  He's had no cough.  He's had no fever, sweats or chills.   Unfortunately, it was very, very difficult getting him the medication so that he would not go bankrupt. I listened to the difficulties that he and his wife had for over half hour. Hopefully, when they have to renew the grants that they got, then it will be a lot easier.  He's had no rashes.  Had no leg swelling.  He is thankful for the snow that we got as a probably will help his garden.   Overall, his performance status is ECOG 1.   Medications:  Current outpatient prescriptions:  .  aspirin EC 325 MG tablet, Take 325 mg by mouth daily. , Disp: , Rfl:  .  beta carotene w/minerals (OCUVITE) tablet, Take 1 tablet by mouth daily., Disp: , Rfl:  .  Cholecalciferol (VITAMIN D3) 5000 UNITS TABS, Take 5,000 Units by mouth daily., Disp: , Rfl:  .  Cinnamon 500 MG capsule, Take 500 mg by mouth daily. , Disp: , Rfl:  .  hydrocortisone-pramoxine (ANALPRAM HC) 2.5-1 % rectal cream, Place 1 application rectally 3 (three) times daily. (Patient taking differently: Place 1 application rectally 3 (three) times daily as needed for hemorrhoids. ), Disp: 30 g, Rfl: 0 .  ixazomib citrate (NINLARO) 3 MG capsule, Take 1 capsule (3 mg total) by mouth once a week. Take on an empty stomach 1hr before or 2hrs after food. Do not crush, chew, or open., Disp: 3 capsule,  Rfl: 6 .  metoprolol (LOPRESSOR) 100 MG tablet, Take 0.5 tablets (50 mg total) by mouth 2 (two) times daily., Disp: 30 tablet, Rfl: 3 .  Multiple Vitamin (MULTIVITAMIN WITH MINERALS) TABS tablet, Take 1 tablet by mouth daily., Disp: , Rfl:  .  naproxen sodium (ANAPROX) 220 MG tablet, Take 220 mg by mouth 3 (three) times daily with meals., Disp: , Rfl:  .  niacin 500 MG tablet, Take 500 mg by mouth daily. , Disp: , Rfl:  .  Omega-3 Fatty Acids (FISH OIL) 1200 MG CAPS, Take 1,200 mg by mouth daily., Disp: , Rfl:  .  pomalidomide (POMALYST) 3 MG capsule, Take 1 capsule (3 mg total) by mouth daily. Take with water on days 1-21. Repeat every 28 days. AUTH# [id number], Disp: 21 capsule, Rfl: 0 .  Probiotic Product (PROBIOTIC PO), Take 1 tablet by mouth daily. , Disp: , Rfl:  .  pyridOXINE (VITAMIN B-6) 100 MG tablet, Take 100 mg by mouth daily., Disp: , Rfl:  No current facility-administered medications for this visit.  Facility-Administered Medications Ordered in Other Visits:  .  zolendronic acid (ZOMETA) 4 mg in sodium chloride 0.9 % 100 mL IVPB, 4 mg, Intravenous, Once, [name], MD  Allergies: No Known Allergies  Past Medical History, Surgical history, Social history, and  Family History were reviewed and updated.  Review of Systems: As above  Physical Exam:  height is 5\' 7"  (1.702 m) and weight is 227 lb (102.967 kg). His oral temperature is 97.7 F (36.5 C). His blood pressure is 147/79 and his pulse is 89. His respiration is 16.  Well-developed and well-nourished white gentleman. Head and neck exam shows no ocular or oral lesions. He has no palpable lymph nodes. Lungs are clear. Cardiac exam regular rate and rhythm with no murmurs rubs or bruits. Abdomen is soft. He's mildly obese. He has good bowel sounds. There is no fluid wave. There is no palpable liver or spleen tip. Back exam shows no tenderness over the spine ribs or hips. Extremities shows no clubbing cyanosis or edema. Has good  range of motion of his joints. Has good muscle strength. Skin exam no rashes. Neurological exam is nonfocal.  Lab Results  Component Value Date   WBC 2.4* 02/02/2015   HGB 11.9* 02/02/2015   HCT 36.6* 02/02/2015   MCV 102* 02/02/2015   PLT 172 02/02/2015     Chemistry      Component Value Date/Time   NA 138 02/02/2015 0852   NA 137 12/08/2014 1512   NA 141 11/03/2014 1336   K 3.9 02/02/2015 0852   K 3.8 12/08/2014 1512   K 4.0 11/03/2014 1336   CL 102 02/02/2015 0852   CL 102 12/08/2014 1512   CO2 27 02/02/2015 0852   CO2 23 12/08/2014 1512   CO2 26 11/03/2014 1336   BUN 16 02/02/2015 0852   BUN 16 12/08/2014 1512   BUN 15.1 11/03/2014 1336   CREATININE 1.0 02/02/2015 0852   CREATININE 0.90 12/08/2014 1512   CREATININE 0.9 11/03/2014 1336      Component Value Date/Time   CALCIUM 9.1 02/02/2015 0852   CALCIUM 9.3 12/08/2014 1512   CALCIUM 9.6 11/03/2014 1336   ALKPHOS 36 02/02/2015 0852   ALKPHOS 35* 12/08/2014 1512   ALKPHOS 41 11/03/2014 1336   AST 29 02/02/2015 0852   AST 28 12/08/2014 1512   AST 35* 11/03/2014 1336   ALT 36 02/02/2015 0852   ALT 37 12/08/2014 1512   ALT 49 11/03/2014 1336   BILITOT 0.70 02/02/2015 0852   BILITOT 0.3 12/08/2014 1512   BILITOT 0.37 11/03/2014 1336       Impression and Plan: Gene Hill is a 75 year old gentleman with history of IgA kappa myeloma. 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 He was treated with standard therapy and got into remission. 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  He has recurred. I think the bone marrow clearly shows that he has recurrence. His myeloma studies have continued to trend upward.  Hopefully, we will see that he is responding. I will like to think that his chance responding should be about 70%.  I note that he does have some high-risk cytogenetics.   I spent about 45 minutes with he has wife today. Most the time, listen to the aggravation that they  had try to get the medicine for him.   I will plan to see him back in one month. [name], MD 1/11/201710:24 AM

## 2015-02-02 NOTE — Patient Instructions (Signed)

## 2015-02-03 LAB — KAPPA/LAMBDA LIGHT CHAINS
Ig Kappa Free Light Chain: 61.88 mg/L — ABNORMAL HIGH (ref 3.30–19.40)
Ig Lambda Free Light Chain: 10.8 mg/L (ref 5.71–26.30)
Kappa/Lambda FluidC Ratio: 5.73 — ABNORMAL HIGH (ref 0.26–1.65)

## 2015-02-04 LAB — PROTEIN ELECTROPHORESIS, SERUM, WITH REFLEX
A/G RATIO SPE: 0.9 (ref 0.7–1.7)
ALPHA 1: 0.2 g/dL (ref 0.0–0.4)
Albumin: 3.4 g/dL (ref 2.9–4.4)
Alpha 2: 0.7 g/dL (ref 0.4–1.0)
Beta: 2.2 g/dL — ABNORMAL HIGH (ref 0.7–1.3)
GAMMA GLOBULIN: 0.6 g/dL (ref 0.4–1.8)
GLOBULIN, TOTAL: 3.7 g/dL (ref 2.2–3.9)
INTERPRETATION(SEE BELOW): 0
M-SPIKE, %: 0.9 g/dL — AB
Total Protein: 7.1 g/dL (ref 6.0–8.5)

## 2015-02-04 LAB — IGG, IGA, IGM
IGG (IMMUNOGLOBIN G), SERUM: 366 mg/dL — AB (ref 700–1600)
IgM, Qn, Serum: 14 mg/dL — ABNORMAL LOW (ref 15–143)

## 2015-02-07 ENCOUNTER — Other Ambulatory Visit (INDEPENDENT_AMBULATORY_CARE_PROVIDER_SITE_OTHER): Payer: PPO

## 2015-02-07 ENCOUNTER — Other Ambulatory Visit: Payer: Self-pay

## 2015-02-07 DIAGNOSIS — R739 Hyperglycemia, unspecified: Secondary | ICD-10-CM | POA: Diagnosis not present

## 2015-02-07 DIAGNOSIS — E785 Hyperlipidemia, unspecified: Secondary | ICD-10-CM

## 2015-02-07 LAB — HEMOGLOBIN A1C: HEMOGLOBIN A1C: 6.1 % (ref 4.6–6.5)

## 2015-02-07 LAB — LIPID PANEL
CHOLESTEROL: 160 mg/dL (ref 0–200)
HDL: 42.6 mg/dL (ref >39.00)
LDL CALC: 90 mg/dL (ref 0–99)
NONHDL: 117.01
Total CHOL/HDL Ratio: 4
Triglycerides: 133 mg/dL (ref 0.0–149.0)
VLDL: 26.6 mg/dL (ref 0.0–40.0)

## 2015-02-09 ENCOUNTER — Other Ambulatory Visit: Payer: PPO

## 2015-02-17 ENCOUNTER — Other Ambulatory Visit: Payer: Self-pay | Admitting: *Deleted

## 2015-02-17 DIAGNOSIS — C9002 Multiple myeloma in relapse: Secondary | ICD-10-CM

## 2015-02-17 MED ORDER — POMALIDOMIDE 3 MG PO CAPS: 3.0000 mg | ORAL_CAPSULE | Freq: Every day | ORAL | Status: DC

## 2015-02-18 ENCOUNTER — Ambulatory Visit (INDEPENDENT_AMBULATORY_CARE_PROVIDER_SITE_OTHER): Payer: PPO | Admitting: Internal Medicine

## 2015-02-18 ENCOUNTER — Encounter: Payer: Self-pay | Admitting: Internal Medicine

## 2015-02-18 VITALS — BP 128/76 | HR 64 | Temp 98.2°F | Ht 67.0 in | Wt 227.4 lb

## 2015-02-18 DIAGNOSIS — R350 Frequency of micturition: Secondary | ICD-10-CM | POA: Diagnosis not present

## 2015-02-18 DIAGNOSIS — I10 Essential (primary) hypertension: Secondary | ICD-10-CM

## 2015-02-18 DIAGNOSIS — Z0000 Encounter for general adult medical examination without abnormal findings: Secondary | ICD-10-CM

## 2015-02-18 DIAGNOSIS — R42 Dizziness and giddiness: Secondary | ICD-10-CM

## 2015-02-18 DIAGNOSIS — M1990 Unspecified osteoarthritis, unspecified site: Secondary | ICD-10-CM | POA: Diagnosis not present

## 2015-02-18 DIAGNOSIS — K2270 Barrett's esophagus without dysplasia: Secondary | ICD-10-CM

## 2015-02-18 DIAGNOSIS — E785 Hyperlipidemia, unspecified: Secondary | ICD-10-CM

## 2015-02-18 NOTE — Patient Instructions (Signed)
BEFORE YOU LEAVE THE OFFICE:  GO TO THE LAB : provide a urine sample   GO TO THE FRONT DESK  Schedule a routine office visit or check up to be done in  6 months , please be fasting    Front desk:   30

## 2015-02-18 NOTE — Progress Notes (Signed)
Pre visit review using our clinic review tool, if applicable. No additional management support is needed unless otherwise documented below in the visit note. 

## 2015-02-18 NOTE — Assessment & Plan Note (Signed)
Td 2013; pneumonia shot 2008 and~ 2011 per Pt ; prevnar-- 2016; no zostavax d/t MM Had a flu shot   No further prostate or colon cancer screening. See previous entry   diet  and exercise discussed

## 2015-02-18 NOTE — Progress Notes (Signed)
Subjective:    Patient ID: Gene Hill, male    DOB: 1940/02/03, 75 y.o.   MRN: [id number]  DOS:  02/18/2015 Type of visit - description : CPX, here with his wife Interval history: Medication list reviewed, he self discontinue simvastatin November 2016, [facility]" recent labs reviewed and discussed with the patient   Review of Systems  Constitutional: No fever. No chills. No unexplained wt changes. No unusual sweats  HEENT: No dental problems, no ear discharge, no facial swelling, no voice changes. No eye discharge, no eye  redness , no  intolerance to light   Respiratory: No wheezing , no  difficulty breathing. No cough , no mucus production  Cardiovascular: No CP, no leg swelling , no  Palpitations  GI: no nausea, no vomiting, no diarrhea , no  abdominal pain.  No blood in the stools. No dysphagia, no odynophagia    Endocrine: Appetite has increased lately, no polyuria , no polydipsia  GU: No dysuria, gross hematuria, difficulty urinating. Occasionally urinary urgency and frequency particularly in the morning after drinking coffee.  Musculoskeletal: cont. w/ generalized aches and pains, no actually joint swellings. Denies fever, chills, weight loss. No headaches  Skin: No change in the color of the skin, palor , no  Rash  Allergic, immunologic: No environmental allergies , no  food allergies  Neurological: Dizziness on and off only when he is stand up, not spinning but he feels like needs to "steady  myself" before he can walk, symptoms are very short lived.  no  syncope. No headaches. No diplopia, no slurred, no slurred speech, no motor deficits, no facial  Numbness  Hematological: No enlarged lymph nodes, no easy bruising , no unusual bleedings  Psychiatry: No suicidal ideas, no hallucinations, no beavior problems, no confusion.  No unusual/severe anxiety, no depression  Past Medical History  Diagnosis Date  . GERD  (gastroesophageal reflux disease)   . Arthritis   . 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 HYPERTENSION 05/03/2006  . CORONARY ARTERY DISEASE 05/03/2006    cath 1998, medical managment, cardiolite neg 3-07  . 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 HYPERLIPIDEMIA 05/03/2006  . Carotid bruit     3-11 carotid u/s was  (-)  . 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 Blood transfusion     Past Surgical History  Procedure Laterality Date  . Rotator cuff repair      right  . Knee surgery  1990    right  . Spinal fusion    . Cervical fusion      Social History   Social History  . Marital Status: Married    Spouse Name: N/A  . Number of Children: 4  . Years of Education: N/A   Occupational History  . retired    Social History Main Topics  . Smoking status: Former Smoker -- 0.25 packs/day for 20 years    Types: Cigarettes    Start date: 04/28/1954    Quit date: 11/28/1974  . Smokeless tobacco: Never Used     Comment: quit 35 years ago, 1976  . Alcohol Use: 0.0 oz/week    0 Standard drinks or equivalent per week     Comment: less than 1 per day   . Drug Use: No  . Sexual Activity: Not on  file   Other Topics Concern  . Not on file   Social History Narrative   Household-- pt, wife, son     Family History  Problem Relation Age of Onset  . Prostate cancer Neg Hx   . Kidney cancer Father   . Leukemia Mother   . Pulmonary embolism Brother   . Stroke Maternal Grandmother   . Heart attack Maternal Grandfather   . Stroke Paternal Grandmother   . Colon cancer Neg Hx   . Esophageal cancer Neg Hx   . Rectal cancer Neg Hx   . Stomach cancer Neg Hx   . Cerebral palsy Son        Medication List       This list is accurate as of: 02/18/15 11:59 PM.  Always use your most recent med list.               aspirin EC 325 MG tablet  Take 325 mg by mouth daily.     beta  carotene w/minerals tablet  Take 1 tablet by mouth daily.     Cinnamon 500 MG capsule  Take 500 mg by mouth daily.     Fish Oil 1200 MG Caps  Take 1,200 mg by mouth daily.     ixazomib citrate 3 MG capsule  Commonly known as:  NINLARO  Take 1 capsule (3 mg total) by mouth once a week. Take on an empty stomach 1hr before or 2hrs after food. Do not crush, chew, or open.     metoprolol 100 MG tablet  Commonly known as:  LOPRESSOR  Take 0.5 tablets (50 mg total) by mouth 2 (two) times daily.     multivitamin with minerals Tabs tablet  Take 1 tablet by mouth daily.     naproxen sodium 220 MG tablet  Commonly known as:  ANAPROX  Take 220 mg by mouth 3 (three) times daily with meals.     niacin 500 MG tablet  Take 500 mg by mouth daily.     pantoprazole 40 MG tablet  Commonly known as:  PROTONIX  Take 40 mg by mouth daily.     pomalidomide 3 MG capsule  Commonly known as:  POMALYST  Take 1 capsule (3 mg total) by mouth daily. Take with water on days 1-21. Repeat every 28 days. AUTH# [id number]     PROBIOTIC PO  Take 1 tablet by mouth daily.     pyridOXINE 100 MG tablet  Commonly known as:  VITAMIN B-6  Take 100 mg by mouth daily.     Vitamin D3 5000 units Tabs  Take 5,000 Units by mouth daily.           Objective:   Physical Exam BP 128/76 mmHg  Pulse 64  Temp(Src) 98.2 F (36.8 C) (Oral)  Ht 5' 7"  (1.702 m)  Wt 227 lb 6 oz (103.137 kg)  BMI 35.60 kg/m2  SpO2 98% General:   Well developed, well nourished . NAD.  HEENT:  Normocephalic . Face symmetric, atraumatic. Cerumen impaction on the left, unable to remove it with a spoon Neck: No thyromegaly  Lungs:  CTA B Normal respiratory effort, no intercostal retractions, no accessory muscle use. Heart: RRR,  no murmur.  No pretibial edema bilaterally  Skin: Not pale. Not jaundice Neurologic:  alert & oriented X3.  Speech normal, gait appropriate for age and unassisted Psych--  Cognition and judgment appear  intact.  Cooperative with normal attention span and concentration.  Behavior appropriate. No anxious  or depressed appearing.      Assessment & Plan:   Assessment HTN Hyperlipidemia (self dc  simva 11-2014)  CAD  By cath 1998, medical management, Cardiolite -007  Multiple myeloma -- stem cell transplant 2011, with recurrence as off 01-2015  GI: --Barrett' esophagus EGD 2010, 2013 --PUD 2010  MSK-- generalized aches, pains, chronic (no better after dc simvastatin ~ 11-2014) Hypogonadism Carotid bruit, [place] (-) 2011  PLAN  HTN: Well-controlled, continue metoprolol Hyperlipidemia: Off simvastatin since November 2016, had a FLP few days ago and results were very good. Recommend diet, exercise and recheck when he comes back CAD: Asymptomatic Multiple myeloma: Recurrent, under the care of hematology Barrett's esophagus: due for a EGD, he is being treated for MM recurrence, we agree not to proceed with a EGD at this point. He is asymptomatic. Continue PPIs Aches and pains: Taking naproxen 3 times a day, recommend to take only 1 and use Tylenol 2 or 3 times a day Dizziness: See review of systems, likely benign dizziness, recommend to stand up slowly, drink plenty of fluids.    Cerumen impaction: Recommend peroxide and self flushing. Call if no better RTC 6 months    Today, in addition to his CPX I spent more than 25   min with the patient: >50% of the time counseling regards recent labs, listening to his concerns about chronic pains, trying to reassuring; also about GI s/e from NSAIDs Also extensive discussion about pro-cons of repeated EGD and mngmt of dizziness

## 2015-02-19 LAB — URINALYSIS, ROUTINE W REFLEX MICROSCOPIC
Bilirubin Urine: NEGATIVE
Glucose, UA: NEGATIVE
HGB URINE DIPSTICK: NEGATIVE
KETONES UR: NEGATIVE
Leukocytes, UA: NEGATIVE
NITRITE: NEGATIVE
PH: 6 (ref 5.0–8.0)
Protein, ur: NEGATIVE
Specific Gravity, Urine: 1.014 (ref 1.001–1.035)

## 2015-02-22 NOTE — Progress Notes (Signed)
Received fax documentation from [employer] that pt's Pomalyst and Ninlaro shipped for delivery on 02/21/15. dph

## 2015-03-02 ENCOUNTER — Other Ambulatory Visit: Payer: Self-pay

## 2015-03-02 ENCOUNTER — Telehealth: Payer: Self-pay | Admitting: Internal Medicine

## 2015-03-02 MED ORDER — METOPROLOL TARTRATE 100 MG PO TABS: 50.0000 mg | ORAL_TABLET | Freq: Two times a day (BID) | ORAL | Status: DC

## 2015-03-02 NOTE — Telephone Encounter (Signed)
Spoke with Bartolo Darter, wanting to know why Metoprolol wasn't sent into pharmacy as 90 day supply. Informed her that it may have been a mistake on my end, but informed her that I would send in now for 90 days and 2 refills. Gene Hill verbalized understanding.

## 2015-03-02 NOTE — Telephone Encounter (Signed)
Caller name:Wix,Florence Lelan Pons Relation to pt: spouse  Call back number:[phone] Pharmacy: [facility] [id number] - [place], [place] [phone] (Phone) [phone] (Fax)         Reason for call:  Spouse would like to discuss metoprolol (LOPRESSOR) 100 MG tablet

## 2015-03-03 ENCOUNTER — Telehealth: Payer: Self-pay | Admitting: Hematology & Oncology

## 2015-03-03 NOTE — Telephone Encounter (Signed)
Pt's wife called regarding more funding assistance. See notes below:  I called [employer] and spoke with Senora and she advised they have a co-pay assistance card program for commercial insurance carriers only at this time that will give them a $25 co-pay. They have no available funding for Medicare patient's at this time and advised me to call [facility]. [employer] P: [phone] [facility] P: [phone]   I called [facility] and spoke with Almyra Free and she advised pt still has funds available to use $5398.05, however he will not be able to re-enroll for more funding until Sept 22, (provided funds are available at that time). Pt can call or do online starting in Sept 2017. She also gave me Good Days to check for more funding.  [place] P: [phone] Case: [id number] Good Days P: [phone]  I process the Patient [facility]) via online and they said: Thank you for your interest in the Patient [place] Multiple Myeloma* program. The assistance program for the  that you have applied for is currently not enrolling new patients. Please check the program status regularly, as this may change. If you have any additional questions, please call [phone] to speak with a PAN Representative.      Multiple myeloma in relapse [facility]) - Primary   C90.02        I called [facility] and spoke with Levada Dy and she advised pt still has funds available to use $9556.00, however he will not be able to re-enroll for more funding until Nov 23, 2015, (provided funds are available at that time). Pt can call or do online starting beg 11 /1/ 2017. She also gave me the information to send in claim form for reimbursement for the Zometa balance via fax. Send the claim form along with EOB and they will pay [facility] the balance. P: [phone]    [facility] POMALY[facility] Zometa - Infusion

## 2015-03-03 NOTE — Telephone Encounter (Signed)
Thank you for applying with Good Days. You have been Approved for Multiple Myeloma assistance. Present the information below to your pharmacy to process payment. If you are receiving your treatment directly from your physician, you will receive a letter in the mail to present to the office. If treatment is needed prior to receiving the letter, please have the office call [phone] to obtain payment. This program may be used at your local pharmacy. You will need to provide the data below to your pharmacist to allow them to use your program benefit. Program Benefit Information NameJames Hill  CSN  Term 03/03/2015 - 01/22/2016  Available Balance $10,000.00 **  Patient Responsibility $5.00  Pharmacy Benefit Information Member Id  BIN [id number]  PCN [id number]  GRP [id number]  Patient Responsibility $5.00  ** Available balance is based on the maximum amount of assistance the patient can receive for this program with full approval. Additional funding may be available through a direct appeal, but is not guaranteed

## 2015-03-04 ENCOUNTER — Ambulatory Visit (HOSPITAL_BASED_OUTPATIENT_CLINIC_OR_DEPARTMENT_OTHER): Payer: PPO | Admitting: Hematology & Oncology

## 2015-03-04 ENCOUNTER — Encounter: Payer: Self-pay | Admitting: Hematology & Oncology

## 2015-03-04 ENCOUNTER — Other Ambulatory Visit (HOSPITAL_BASED_OUTPATIENT_CLINIC_OR_DEPARTMENT_OTHER): Payer: PPO

## 2015-03-04 ENCOUNTER — Ambulatory Visit (HOSPITAL_BASED_OUTPATIENT_CLINIC_OR_DEPARTMENT_OTHER): Payer: PPO

## 2015-03-04 VITALS — BP 154/77 | HR 80 | Temp 97.9°F | Resp 16 | Ht 67.0 in | Wt 226.0 lb

## 2015-03-04 DIAGNOSIS — C9002 Multiple myeloma in relapse: Secondary | ICD-10-CM | POA: Diagnosis not present

## 2015-03-04 DIAGNOSIS — E349 Endocrine disorder, unspecified: Secondary | ICD-10-CM

## 2015-03-04 LAB — CMP (CANCER CENTER ONLY)
ALK PHOS: 40 U/L (ref 26–84)
ALT: 36 U/L (ref 10–47)
AST: 31 U/L (ref 11–38)
Albumin: 3.5 g/dL (ref 3.3–5.5)
BILIRUBIN TOTAL: 0.7 mg/dL (ref 0.20–1.60)
BUN: 15 mg/dL (ref 7–22)
CALCIUM: 9.2 mg/dL (ref 8.0–10.3)
CO2: 28 meq/L (ref 18–33)
Chloride: 104 mEq/L (ref 98–108)
Creat: 0.8 mg/dl (ref 0.6–1.2)
GLUCOSE: 101 mg/dL (ref 73–118)
Potassium: 3.9 mEq/L (ref 3.3–4.7)
SODIUM: 139 meq/L (ref 128–145)
Total Protein: 7.7 g/dL (ref 6.4–8.1)

## 2015-03-04 LAB — CBC WITH DIFFERENTIAL (CANCER CENTER ONLY)
BASO#: 0 10*3/uL (ref 0.0–0.2)
BASO%: 1.4 % (ref 0.0–2.0)
EOS%: 4.7 % (ref 0.0–7.0)
Eosinophils Absolute: 0.1 10*3/uL (ref 0.0–0.5)
HCT: 35.3 % — ABNORMAL LOW (ref 38.7–49.9)
HGB: 11.5 g/dL — ABNORMAL LOW (ref 13.0–17.1)
LYMPH#: 0.9 10*3/uL (ref 0.9–3.3)
LYMPH%: 40.1 % (ref 14.0–48.0)
MCH: 33.7 pg — ABNORMAL HIGH (ref 28.0–33.4)
MCHC: 32.6 g/dL (ref 32.0–35.9)
MCV: 104 fL — AB (ref 82–98)
MONO#: 0.4 10*3/uL (ref 0.1–0.9)
MONO%: 17.5 % — AB (ref 0.0–13.0)
NEUT#: 0.8 10*3/uL — ABNORMAL LOW (ref 1.5–6.5)
NEUT%: 36.3 % — AB (ref 40.0–80.0)
PLATELETS: 165 10*3/uL (ref 145–400)
RBC: 3.41 10*6/uL — AB (ref 4.20–5.70)
RDW: 16.1 % — AB (ref 11.1–15.7)
WBC: 2.1 10*3/uL — AB (ref 4.0–10.0)

## 2015-03-04 LAB — LACTATE DEHYDROGENASE: LDH: 161 U/L (ref 125–245)

## 2015-03-04 MED ORDER — SODIUM CHLORIDE 0.9 % IV SOLN
INTRAVENOUS | Status: DC
  Administered 2015-03-04: 10:00:00 via INTRAVENOUS

## 2015-03-04 MED ORDER — ZOLEDRONIC ACID 4 MG/100ML IV SOLN
4.0000 mg | Freq: Once | INTRAVENOUS | Status: AC
  Administered 2015-03-04: 4 mg via INTRAVENOUS
  Filled 2015-03-04: qty 100

## 2015-03-04 NOTE — Patient Instructions (Signed)

## 2015-03-04 NOTE — Progress Notes (Signed)
Hematology and Oncology Follow Up Visit  Gene Hill [id number] 1940/04/10 75 y.o. 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  Gene Hill is back for followup. He really looks quite good. He has had no problems with the Pomalidomide or Ninlaro. He's had no significant fatigue. He is still doing a lot of work outside in his garden. His kindergarten ready for springtime planting.  He's had no bone pain. He's had no mouth sores. He's had no diarrhea. He's had no rashes.   Overall, his performance status is ECOG 1.   Medications:  Current outpatient prescriptions:  .  aspirin EC 325 MG tablet, Take 325 mg by mouth daily. , Disp: , Rfl:  .  beta carotene w/minerals (OCUVITE) tablet, Take 1 tablet by mouth daily., Disp: , Rfl:  .  Cholecalciferol (VITAMIN D3) 5000 UNITS TABS, Take 5,000 Units by mouth daily., Disp: , Rfl:  .  Cinnamon 500 MG capsule, Take 500 mg by mouth daily. , Disp: , Rfl:  .  ixazomib citrate (NINLARO) 3 MG capsule, Take 1 capsule (3 mg total) by mouth once a week. Take on an empty stomach 1hr before or 2hrs after food. Do not crush, chew, or open., Disp: 3 capsule, Rfl: 6 .  metoprolol (LOPRESSOR) 100 MG tablet, Take 0.5 tablets (50 mg total) by mouth 2 (two) times daily., Disp: 90 tablet, Rfl: 2 .  Multiple Vitamin (MULTIVITAMIN WITH MINERALS) TABS tablet, Take 1 tablet by mouth daily., Disp: , Rfl:  .  naproxen sodium (ANAPROX) 220 MG tablet, Take 220 mg by mouth 3 (three) times daily with meals., Disp: , Rfl:  .  niacin 500 MG tablet, Take 500 mg by mouth daily. , Disp: , Rfl:  .  Omega-3 Fatty Acids (FISH OIL) 1200 MG CAPS, Take 1,200 mg by mouth daily., Disp: , Rfl:  .  pantoprazole (PROTONIX) 40 MG tablet, Take 40 mg by mouth daily., Disp: , Rfl:  .   pomalidomide (POMALYST) 3 MG capsule, Take 1 capsule (3 mg total) by mouth daily. Take with water on days 1-21. Repeat every 28 days. AUTH# [id number], Disp: 21 capsule, Rfl: 0 .  Probiotic Product (PROBIOTIC PO), Take 1 tablet by mouth daily. , Disp: , Rfl:  .  pyridOXINE (VITAMIN B-6) 100 MG tablet, Take 100 mg by mouth daily., Disp: , Rfl:   Allergies: No Known Allergies  Past Medical History, Surgical history, Social history, and Family History were reviewed and updated.  Review of Systems: As above  Physical Exam:  height is 5\' 7"  (1.702 m) and weight is 226 lb (102.513 kg). His oral temperature is 97.9 F (36.6 C). His blood pressure is 154/77 and his pulse is 80. His respiration is 16.  Well-developed and well-nourished white gentleman. Head and neck exam shows no ocular or oral lesions. He has no palpable lymph nodes. Lungs are clear. Cardiac exam regular rate and rhythm with no murmurs rubs or bruits. Abdomen is soft. He's mildly obese. He has good bowel sounds. There is no fluid wave. There is no palpable liver or spleen tip. Back exam shows no tenderness over the spine ribs or hips. Extremities shows no clubbing cyanosis or edema.  Has good range of motion of his joints. Has good muscle strength. Skin exam no rashes. Neurological exam is nonfocal.  Lab Results  Component Value Date   WBC 2.1* 03/04/2015   HGB 11.5* 03/04/2015   HCT 35.3* 03/04/2015   MCV 104* 03/04/2015   PLT 165 03/04/2015     Chemistry      Component Value Date/Time   NA 139 03/04/2015 0829   NA 137 12/08/2014 1512   NA 141 11/03/2014 1336   K 3.9 03/04/2015 0829   K 3.8 12/08/2014 1512   K 4.0 11/03/2014 1336   CL 104 03/04/2015 0829   CL 102 12/08/2014 1512   CO2 28 03/04/2015 0829   CO2 23 12/08/2014 1512   CO2 26 11/03/2014 1336   BUN 15 03/04/2015 0829   BUN 16 12/08/2014 1512   BUN 15.1 11/03/2014 1336   CREATININE 0.8 03/04/2015 0829   CREATININE 0.90 12/08/2014 1512   CREATININE 0.9  11/03/2014 1336      Component Value Date/Time   CALCIUM 9.2 03/04/2015 0829   CALCIUM 9.3 12/08/2014 1512   CALCIUM 9.6 11/03/2014 1336   ALKPHOS 40 03/04/2015 0829   ALKPHOS 35* 12/08/2014 1512   ALKPHOS 41 11/03/2014 1336   AST 31 03/04/2015 0829   AST 28 12/08/2014 1512   AST 35* 11/03/2014 1336   ALT 36 03/04/2015 0829   ALT 37 12/08/2014 1512   ALT 49 11/03/2014 1336   BILITOT 0.70 03/04/2015 0829   BILITOT 0.3 12/08/2014 1512   BILITOT 0.37 11/03/2014 1336       Impression and Plan: Mr. [name] is a 75 year old gentleman with history of IgA kappa myeloma. 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 He was treated with standard therapy and got into remission. 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  His relapse myeloma is certainly high-risk given the chromosomal abnormalities..  Hopefully, we will see that he is responding. I will like to think that his chance responding should be about 70%.   I spent about 45 minutes with he has wife today. Most the time, I listened to the aggravation that they had trying to get the medicine pain 4. Thankfully it has been covered.   I will plan to see him back in one month. [name], MD 2/10/20175:06 PM

## 2015-03-05 LAB — IGG, IGA, IGM
IGM (IMMUNOGLOBIN M), SRM: 13 mg/dL — AB (ref 15–143)
IgA, Qn, Serum: 1536 mg/dL — ABNORMAL HIGH (ref 61–437)
IgG, Qn, Serum: 421 mg/dL — ABNORMAL LOW (ref 700–1600)

## 2015-03-05 LAB — BETA 2 MICROGLOBULIN, SERUM: BETA 2: 2 mg/L (ref 0.6–2.4)

## 2015-03-07 ENCOUNTER — Telehealth: Payer: Self-pay | Admitting: Hematology & Oncology

## 2015-03-07 LAB — KAPPA/LAMBDA LIGHT CHAINS
IG LAMBDA FREE LIGHT CHAIN: 11.2 mg/L (ref 5.71–26.30)
Ig Kappa Free Light Chain: 57.65 mg/L — ABNORMAL HIGH (ref 3.30–19.40)
KAPPA/LAMBDA FLC RATIO: 5.15 — AB (ref 0.26–1.65)

## 2015-03-07 NOTE — Telephone Encounter (Signed)
Called patient and left a message confirming upcoming appt(s).       [facility]. °

## 2015-03-09 LAB — PROTEIN ELECTROPHORESIS, SERUM, WITH REFLEX
A/G RATIO SPE: 1 (ref 0.7–1.7)
ALBUMIN: 3.5 g/dL (ref 2.9–4.4)
Alpha 1: 0.2 g/dL (ref 0.0–0.4)
Alpha 2: 0.7 g/dL (ref 0.4–1.0)
Beta: 2.1 g/dL — ABNORMAL HIGH (ref 0.7–1.3)
GAMMA GLOBULIN: 0.6 g/dL (ref 0.4–1.8)
Globulin, Total: 3.6 g/dL (ref 2.2–3.9)
Interpretation(See Below): 0
M-SPIKE, %: 0.9 g/dL — AB
TOTAL PROTEIN: 7.1 g/dL (ref 6.0–8.5)

## 2015-03-18 ENCOUNTER — Other Ambulatory Visit: Payer: Self-pay | Admitting: *Deleted

## 2015-03-18 ENCOUNTER — Encounter: Payer: Self-pay | Admitting: Hematology & Oncology

## 2015-03-18 DIAGNOSIS — C9002 Multiple myeloma in relapse: Secondary | ICD-10-CM

## 2015-03-18 MED ORDER — POMALIDOMIDE 3 MG PO CAPS: 3.0000 mg | ORAL_CAPSULE | Freq: Every day | ORAL | Status: DC

## 2015-03-21 ENCOUNTER — Ambulatory Visit: Payer: PPO

## 2015-03-21 ENCOUNTER — Ambulatory Visit (INDEPENDENT_AMBULATORY_CARE_PROVIDER_SITE_OTHER): Payer: PPO

## 2015-03-21 VITALS — BP 148/72 | HR 64 | Ht 67.0 in | Wt 228.4 lb

## 2015-03-21 DIAGNOSIS — C9002 Multiple myeloma in relapse: Secondary | ICD-10-CM

## 2015-03-21 DIAGNOSIS — Z0000 Encounter for general adult medical examination without abnormal findings: Secondary | ICD-10-CM

## 2015-03-21 NOTE — Progress Notes (Addendum)
Subjective:   Gene Hill is a 75 y.o. male who presents for Medicare Annual/Subsequent preventive examination.  Review of Systems: No ROS advanced age (>30mn, >>55women); hypertension; male gender; obesity (BMI >30kg/m2)taken today Sleep patterns:  Sleeps at least 8 hours per night Home Safety/Smoke Alarms: Feels safe at home. Lives at home with wife and son in 2 story home.  Smoke alarms present.   Firearm Safety:  Keeps them in a safe place.   Seat Belt Safety/Bike Helmet:  Always wears seat belt.    Counseling:   Eye Exam- Plans to schedule an appt.   Dental- Last appt 2 years ago.   Male:  CCS-09/22/02-diverticulosis; repeat was suppose to be in 2009--pt declined due to cancer. PSA-08/23/11- 1.03  Immunization: Information updated in chart.  Zostavax- not a candidate due to multiple myeloma.       Objective:    Vitals: BP 148/72 mmHg  Pulse 64  Ht 5' 7" (1.702 m)  Wt 228 lb 6.4 oz (103.602 kg)  BMI 35.76 kg/m2  SpO2 96%  Tobacco History  Smoking status  . Former Smoker -- 0.25 packs/day for 20 years  . Types: Cigarettes  . Start date: 04/28/1954  . Quit date: 11/28/1974  Smokeless tobacco  . Never Used    Comment: quit 35 years ago, 1976     Counseling given: No   Past Medical History  Diagnosis Date  . GERD (gastroesophageal reflux disease)   . Arthritis   . 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 HYPERTENSION 05/03/2006  . CORONARY ARTERY DISEASE 05/03/2006    cath 1998, medical managment, cardiolite neg 3-07  . 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 PEPTIC ULCER DISEASE 07/22/2008    per EGD 6-10 ..Marland Kitchenulcer duodenitis   . HYPERLIPIDEMIA 05/03/2006  . Carotid bruit     3-11 carotid u/s was  (-)  . 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 Blood transfusion    Past Surgical History  Procedure Laterality Date  . Rotator cuff repair      right  . Knee surgery  1990    right  . Spinal fusion    . Cervical fusion     Family  History  Problem Relation Age of Onset  . Prostate cancer Neg Hx   . Kidney cancer Father   . Leukemia Mother   . Pulmonary embolism Brother   . Stroke Maternal Grandmother   . Heart attack Maternal Grandfather   . Stroke Paternal Grandmother   . Colon cancer Neg Hx   . Esophageal cancer Neg Hx   . Rectal cancer Neg Hx   . Stomach cancer Neg Hx   . Cerebral palsy Son    History  Sexual Activity  . Sexual Activity: Not on file    Outpatient Encounter Prescriptions as of 03/21/2015  Medication Sig  . acetaminophen (TYLENOL) 500 MG tablet Take 1,000 mg by mouth at bedtime.  .Marland Kitchenaspirin EC 325 MG tablet Take 325 mg by mouth daily.   . beta carotene w/minerals (OCUVITE) tablet Take 1 tablet by mouth daily.  . Cholecalciferol (VITAMIN D3) 5000 UNITS TABS Take 5,000 Units by mouth daily.  . Cinnamon 500 MG capsule Take 500 mg by mouth daily.   . ixazomib citrate (NINLARO) 3 MG capsule Take 1 capsule (3 mg total) by mouth once a week. Take on an empty stomach 1hr before or 2hrs after food. Do not crush,  chew, or open.  . metoprolol (LOPRESSOR) 100 MG tablet Take 0.5 tablets (50 mg total) by mouth 2 (two) times daily.  . Multiple Vitamin (MULTIVITAMIN WITH MINERALS) TABS tablet Take 1 tablet by mouth daily.  . naproxen sodium (ANAPROX) 220 MG tablet Take 220 mg by mouth 2 (two) times daily with a meal.   . niacin 500 MG tablet Take 500 mg by mouth daily.   . Omega-3 Fatty Acids (FISH OIL) 1200 MG CAPS Take 1,200 mg by mouth daily.  . pantoprazole (PROTONIX) 40 MG tablet Take 40 mg by mouth daily.  . pomalidomide (POMALYST) 3 MG capsule Take 1 capsule (3 mg total) by mouth daily. Take with water on days 1-21. Repeat every 28 days. AUTH# [id number]  . Probiotic Product (PROBIOTIC PO) Take 1 tablet by mouth daily.   Marland Kitchen pyridOXINE (VITAMIN B-6) 100 MG tablet Take 100 mg by mouth daily.   No facility-administered encounter medications on file as of 03/21/2015.    Activities of Daily Living In  your present state of health, do you have any difficulty performing the following activities: 03/21/2015 11/23/2014  Hearing? N N  Vision? N N  Difficulty concentrating or making decisions? Y N  Walking or climbing stairs? Y N  Dressing or bathing? N N  Doing errands, shopping? N -  Preparing Food and eating ? N -  Using the Toilet? N -  In the past six months, have you accidently leaked urine? Y -  Do you have problems with loss of bowel control? N -  Managing your Medications? N -  Managing your Finances? N -  Housekeeping or managing your Housekeeping? N -    Patient Care Team: Colon Branch, MD as PCP - General [name], MD as Consulting Physician (Oncology)  Assessment:  Completed by PCP.    Exercise Activities and Dietary recommendations Current Exercise Habits:: The patient does not participate in regular exercise at present (Stays active gardening, walking some,  cleaning chicken coup)  Diet:  Regular diet.  Wife cooks.  Drinks 4-5 cups of coffee per day.    Goals    . Lose 10 lbs by next year.        Fall Risk Fall Risk  03/21/2015 03/04/2015 02/02/2015 12/29/2014 12/08/2014  Falls in the past year? _0   Number falls in past yr: - - - - -  Injury with Fall? - - - - -  Risk for fall due to : Impaired balance/gait - - - -  Risk for fall due to (comments): - - - - -   Depression Screen PHQ 2/9 Scores 03/21/2015 05/17/2014 04/27/2013 08/27/2012  PHQ - 2 Score 0 0 0 0    Cognitive Testing MMSE - Mini Mental State Exam 03/21/2015  Orientation to time 5  Orientation to Place 5  Registration 3  Attention/ Calculation 5  Recall 3  Language- name 2 objects 2  Language- repeat 1  Language- follow 3 step command 3  Language- read & follow direction 1  Write a sentence 1  Copy design 1  Total score 30    Immunization History  Administered Date(s) Administered  . Influenza Split 11/13/2011  . Influenza Whole 01/20/2007, 01/04/2009  . Influenza,inj,Quad  PF,36+ Mos 10/10/2012  . Influenza-Unspecified 10/27/2014  . Pneumococcal Conjugate-13 02/05/2014  . Pneumococcal Polysaccharide-23 01/22/2006  . Td 01/23/2011   Screening Tests Health Maintenance  Topic Date Due  . INFLUENZA VACCINE  11/03/2015 (Originally 08/23/2015)  .  ZOSTAVAX  03/24/2018 (Originally 09/03/1998)  . TETANUS/TDAP  01/22/2021  . PNA vac Low Risk Adult  Completed      Plan:  Continue doing brain stimulating activities (puzzles, reading, adult coloring books, staying active) to keep memory sharp.   Eat heart healthy diet (full of fruits, vegetables, whole grains, lean protein, water--limit salt, fat, and sugar intake) and increase physical activity as tolerated.  Increase physical activity by walking.    Follow up with Dr. Larose Kells as scheduled.      During the course of the visit the patient was educated and counseled about the following appropriate screening and preventive services:   Vaccines to include Pneumoccal, Influenza, Hepatitis B, Td, Zostavax, HCV  Electrocardiogram  Cardiovascular Disease  Colorectal cancer screening  Diabetes screening  Prostate Cancer Screening  Glaucoma screening  Nutrition counseling   Smoking cessation counseling  Patient Instructions (the written plan) was given to the patient.    Rudene Anda, RN  03/21/2015   Agree French Ana MD

## 2015-03-21 NOTE — Patient Instructions (Addendum)
Continue doing brain stimulating activities (puzzles, reading, adult coloring books, staying active) to keep memory sharp.   Eat heart healthy diet (full of fruits, vegetables, whole grains, lean protein, water--limit salt, fat, and sugar intake) and increase physical activity as tolerated.  Increase physical activity by walking.    Follow up with Dr. Larose Kells as scheduled.        Fall Prevention in the Home  Falls can cause injuries. They can happen to people of all ages. There are many things you can do to make your home safe and to help prevent falls.  WHAT CAN I DO ON THE OUTSIDE OF MY HOME?  Regularly fix the edges of walkways and driveways and fix any cracks.  Remove anything that might make you trip as you walk through a door, such as a raised step or threshold.  Trim any bushes or trees on the path to your home.  Use bright outdoor lighting.  Clear any walking paths of anything that might make someone trip, such as rocks or tools.  Regularly check to see if handrails are loose or broken. Make sure that both sides of any steps have handrails.  Any raised decks and porches should have guardrails on the edges.  Have any leaves, snow, or ice cleared regularly.  Use sand or salt on walking paths during winter.  Clean up any spills in your garage right away. This includes oil or grease spills. WHAT CAN I DO IN THE BATHROOM?   Use night lights.  Install grab bars by the toilet and in the tub and shower. Do not use towel bars as grab bars.  Use non-skid mats or decals in the tub or shower.  If you need to sit down in the shower, use a plastic, non-slip stool.  Keep the floor dry. Clean up any water that spills on the floor as soon as it happens.  Remove soap buildup in the tub or shower regularly.  Attach bath mats securely with double-sided non-slip rug tape.  Do not have throw rugs and other things on the floor that can make you trip. WHAT CAN I DO IN THE  BEDROOM?  Use night lights.  Make sure that you have a light by your bed that is easy to reach.  Do not use any sheets or blankets that are too big for your bed. They should not hang down onto the floor.  Have a firm chair that has side arms. You can use this for support while you get dressed.  Do not have throw rugs and other things on the floor that can make you trip. WHAT CAN I DO IN THE KITCHEN?  Clean up any spills right away.  Avoid walking on wet floors.  Keep items that you use a lot in easy-to-reach places.  If you need to reach something above you, use a strong step stool that has a grab bar.  Keep electrical cords out of the way.  Do not use floor polish or wax that makes floors slippery. If you must use wax, use non-skid floor wax.  Do not have throw rugs and other things on the floor that can make you trip. WHAT CAN I DO WITH MY STAIRS?  Do not leave any items on the stairs.  Make sure that there are handrails on both sides of the stairs and use them. Fix handrails that are broken or loose. Make sure that handrails are as long as the stairways.  Check any carpeting to make  sure that it is firmly attached to the stairs. Fix any carpet that is loose or worn.  Avoid having throw rugs at the top or bottom of the stairs. If you do have throw rugs, attach them to the floor with carpet tape.  Make sure that you have a light switch at the top of the stairs and the bottom of the stairs. If you do not have them, ask someone to add them for you. WHAT ELSE CAN I DO TO HELP PREVENT FALLS?  Wear shoes that:  Do not have high heels.  Have rubber bottoms.  Are comfortable and fit you well.  Are closed at the toe. Do not wear sandals.  If you use a stepladder:  Make sure that it is fully opened. Do not climb a closed stepladder.  Make sure that both sides of the stepladder are locked into place.  Ask someone to hold it for you, if possible.  Clearly mark and make  sure that you can see:  Any grab bars or handrails.  First and last steps.  Where the edge of each step is.  Use tools that help you move around (mobility aids) if they are needed. These include:  Canes.  Walkers.  Scooters.  Crutches.  Turn on the lights when you go into a dark area. Replace any light bulbs as soon as they burn out.  Set up your furniture so you have a clear path. Avoid moving your furniture around.  If any of your floors are uneven, fix them.  If there are any pets around you, be aware of where they are.  Review your medicines with your doctor. Some medicines can make you feel dizzy. This can increase your chance of falling. Ask your doctor what other things that you can do to help prevent falls.   This information is not intended to replace advice given to you by your health care provider. Make sure you discuss any questions you have with your health care provider.   Document Released: 11/04/2008 Document Revised: 05/25/2014 Document Reviewed: 02/12/2014 Elsevier Interactive Patient Education 2016 [place] Maintenance, Male A healthy lifestyle and preventative care can promote health and wellness.  Maintain regular health, dental, and eye exams.  Eat a healthy diet. Foods like vegetables, fruits, whole grains, low-fat dairy products, and lean protein foods contain the nutrients you need and are low in calories. Decrease your intake of foods high in solid fats, added sugars, and salt. Get information about a proper diet from your health care provider, if necessary.  Regular physical exercise is one of the most important things you can do for your health. Most adults should get at least 150 minutes of moderate-intensity exercise (any activity that increases your heart rate and causes you to sweat) each week. In addition, most adults need muscle-strengthening exercises on 2 or more days a week.   Maintain a healthy weight. The body mass index  (BMI) is a screening tool to identify possible weight problems. It provides an estimate of body fat based on height and weight. Your health care provider can find your BMI and can help you achieve or maintain a healthy weight. For males 20 years and older:  A BMI below 18.5 is considered underweight.  A BMI of 18.5 to 24.9 is normal.  A BMI of 25 to 29.9 is considered overweight.  A BMI of 30 and above is considered obese.  Maintain normal blood lipids and cholesterol by exercising and minimizing your  intake of saturated fat. Eat a balanced diet with plenty of fruits and vegetables. Blood tests for lipids and cholesterol should begin at age 52 and be repeated every 5 years. If your lipid or cholesterol levels are high, you are over age 54, or you are at high risk for heart disease, you may need your cholesterol levels checked more frequently.Ongoing high lipid and cholesterol levels should be treated with medicines if diet and exercise are not working.  If you smoke, find out from your health care provider how to quit. If you do not use tobacco, do not start.  Lung cancer screening is recommended for adults aged 29-80 years who are at high risk for developing lung cancer because of a history of smoking. A yearly low-dose CT scan of the lungs is recommended for people who have at least a 30-pack-year history of smoking and are current smokers or have quit within the past 15 years. A pack year of smoking is smoking an average of 1 pack of cigarettes a day for 1 year (for example, a 30-pack-year history of smoking could mean smoking 1 pack a day for 30 years or 2 packs a day for 15 years). Yearly screening should continue until the smoker has stopped smoking for at least 15 years. Yearly screening should be stopped for people who develop a health problem that would prevent them from having lung cancer treatment.  If you choose to drink alcohol, do not have more than 2 drinks per day. One drink is  considered to be 12 oz (360 mL) of beer, 5 oz (150 mL) of wine, or 1.5 oz (45 mL) of liquor.  Avoid the use of street drugs. Do not share needles with anyone. Ask for help if you need support or instructions about stopping the use of drugs.  High blood pressure causes heart disease and increases the risk of stroke. High blood pressure is more likely to develop in:  People who have blood pressure in the end of the normal range (100-139/85-89 mm Hg).  People who are overweight or obese.  People who are African American.  If you are 5-78 years of age, have your blood pressure checked every 3-5 years. If you are 58 years of age or older, have your blood pressure checked every year. You should have your blood pressure measured twice--once when you are at a hospital or clinic, and once when you are not at a hospital or clinic. Record the average of the two measurements. To check your blood pressure when you are not at a hospital or clinic, you can use:  An automated blood pressure machine at a pharmacy.  A home blood pressure monitor.  If you are 38-44 years old, ask your health care provider if you should take aspirin to prevent heart disease.  Diabetes screening involves taking a blood sample to check your fasting blood sugar level. This should be done once every 3 years after age 48 if you are at a normal weight and without risk factors for diabetes. Testing should be considered at a younger age or be carried out more frequently if you are overweight and have at least 1 risk factor for diabetes.  Colorectal cancer can be detected and often prevented. Most routine colorectal cancer screening begins at the age of 59 and continues through age 4. However, your health care provider may recommend screening at an earlier age if you have risk factors for colon cancer. On a yearly basis, your health care provider  may provide home test kits to check for hidden blood in the stool. A small camera at the end  of a tube may be used to directly examine the colon (sigmoidoscopy or colonoscopy) to detect the earliest forms of colorectal cancer. Talk to your health care provider about this at age 7 when routine screening begins. A direct exam of the colon should be repeated every 5-10 years through age 91, unless early forms of precancerous polyps or small growths are found.  People who are at an increased risk for hepatitis B should be screened for this virus. You are considered at high risk for hepatitis B if:  You were born in a country where hepatitis B occurs often. Talk with your health care provider about which countries are considered high risk.  Your parents were born in a high-risk country and you have not received a shot to protect against hepatitis B (hepatitis B vaccine).  You have HIV or AIDS.  You use needles to inject street drugs.  You live with, or have sex with, someone who has hepatitis B.  You are a man who has sex with other men (MSM).  You get hemodialysis treatment.  You take certain medicines for conditions like cancer, organ transplantation, and autoimmune conditions.  Hepatitis C blood testing is recommended for all people born from [birth year] through 1967 and any individual with known risk factors for hepatitis C.  Healthy men should no longer receive prostate-specific antigen (PSA) blood tests as part of routine cancer screening. Talk to your health care provider about prostate cancer screening.  Testicular cancer screening is not recommended for adolescents or adult males who have no symptoms. Screening includes self-exam, a health care provider exam, and other screening tests. Consult with your health care provider about any symptoms you have or any concerns you have about testicular cancer.  Practice safe sex. Use condoms and avoid high-risk sexual practices to reduce the spread of sexually transmitted infections (STIs).  You should be screened for STIs, including  gonorrhea and chlamydia if:  You are sexually active and are younger than 24 years.  You are older than 24 years, and your health care provider tells you that you are at risk for this type of infection.  Your sexual activity has changed since you were last screened, and you are at an increased risk for chlamydia or gonorrhea. Ask your health care provider if you are at risk.  If you are at risk of being infected with HIV, it is recommended that you take a prescription medicine daily to prevent HIV infection. This is called pre-exposure prophylaxis (PrEP). You are considered at risk if:  You are a man who has sex with other men (MSM).  You are a heterosexual man who is sexually active with multiple partners.  You take drugs by injection.  You are sexually active with a partner who has HIV.  Talk with your health care provider about whether you are at high risk of being infected with HIV. If you choose to begin PrEP, you should first be tested for HIV. You should then be tested every 3 months for as long as you are taking PrEP.  Use sunscreen. Apply sunscreen liberally and repeatedly throughout the day. You should seek shade when your shadow is shorter than you. Protect yourself by wearing long sleeves, pants, a wide-brimmed hat, and sunglasses year round whenever you are outdoors.  Tell your health care provider of new moles or changes in moles, especially if there  is a change in shape or color. Also, tell your health care provider if a mole is larger than the size of a pencil eraser.  A one-time screening for abdominal aortic aneurysm (AAA) and surgical repair of large AAAs by ultrasound is recommended for men aged 44-75 years who are current or former smokers.  Stay current with your vaccines (immunizations).   This information is not intended to replace advice given to you by your health care provider. Make sure you discuss any questions you have with your health care provider.    Document Released: 07/07/2007 Document Revised: 01/29/2014 Document Reviewed: 06/05/2010 Elsevier Interactive Patient Education [employer].

## 2015-03-21 NOTE — Progress Notes (Signed)
Pre visit review using our clinic review tool, if applicable. No additional management support is needed unless otherwise documented below in the visit note. 

## 2015-03-21 NOTE — Assessment & Plan Note (Addendum)
Multiple myeloma: Recurrent, followed by Dr. Marin Olp.

## 2015-03-31 ENCOUNTER — Other Ambulatory Visit (HOSPITAL_BASED_OUTPATIENT_CLINIC_OR_DEPARTMENT_OTHER): Payer: PPO

## 2015-03-31 ENCOUNTER — Ambulatory Visit (HOSPITAL_BASED_OUTPATIENT_CLINIC_OR_DEPARTMENT_OTHER): Payer: PPO

## 2015-03-31 ENCOUNTER — Encounter: Payer: Self-pay | Admitting: Hematology & Oncology

## 2015-03-31 ENCOUNTER — Ambulatory Visit (HOSPITAL_BASED_OUTPATIENT_CLINIC_OR_DEPARTMENT_OTHER): Payer: PPO | Admitting: Hematology & Oncology

## 2015-03-31 VITALS — BP 131/69 | HR 67 | Temp 98.1°F | Resp 16 | Ht 67.0 in | Wt 224.0 lb

## 2015-03-31 DIAGNOSIS — C9002 Multiple myeloma in relapse: Secondary | ICD-10-CM

## 2015-03-31 DIAGNOSIS — E349 Endocrine disorder, unspecified: Secondary | ICD-10-CM

## 2015-03-31 LAB — CMP (CANCER CENTER ONLY)
ALT(SGPT): 35 U/L (ref 10–47)
AST: 28 U/L (ref 11–38)
Albumin: 3.4 g/dL (ref 3.3–5.5)
Alkaline Phosphatase: 32 U/L (ref 26–84)
BUN: 14 mg/dL (ref 7–22)
CHLORIDE: 103 meq/L (ref 98–108)
CO2: 30 meq/L (ref 18–33)
Calcium: 9.6 mg/dL (ref 8.0–10.3)
Creat: 1.2 mg/dl (ref 0.6–1.2)
GLUCOSE: 124 mg/dL — AB (ref 73–118)
POTASSIUM: 4 meq/L (ref 3.3–4.7)
Sodium: 140 mEq/L (ref 128–145)
TOTAL PROTEIN: 7.4 g/dL (ref 6.4–8.1)
Total Bilirubin: 0.7 mg/dl (ref 0.20–1.60)

## 2015-03-31 LAB — CBC WITH DIFFERENTIAL (CANCER CENTER ONLY)
BASO#: 0 10*3/uL (ref 0.0–0.2)
BASO%: 0.9 % (ref 0.0–2.0)
EOS ABS: 0.1 10*3/uL (ref 0.0–0.5)
EOS%: 4.4 % (ref 0.0–7.0)
HCT: 34.8 % — ABNORMAL LOW (ref 38.7–49.9)
HGB: 11.5 g/dL — ABNORMAL LOW (ref 13.0–17.1)
LYMPH#: 0.8 10*3/uL — ABNORMAL LOW (ref 0.9–3.3)
LYMPH%: 36.1 % (ref 14.0–48.0)
MCH: 34 pg — AB (ref 28.0–33.4)
MCHC: 33 g/dL (ref 32.0–35.9)
MCV: 103 fL — AB (ref 82–98)
MONO#: 0.3 10*3/uL (ref 0.1–0.9)
MONO%: 13.7 % — AB (ref 0.0–13.0)
NEUT#: 1 10*3/uL — ABNORMAL LOW (ref 1.5–6.5)
NEUT%: 44.9 % (ref 40.0–80.0)
PLATELETS: 178 10*3/uL (ref 145–400)
RBC: 3.38 10*6/uL — ABNORMAL LOW (ref 4.20–5.70)
RDW: 16.3 % — AB (ref 11.1–15.7)
WBC: 2.3 10*3/uL — ABNORMAL LOW (ref 4.0–10.0)

## 2015-03-31 MED ORDER — ZOLEDRONIC ACID 4 MG/100ML IV SOLN
4.0000 mg | Freq: Once | INTRAVENOUS | Status: AC
  Administered 2015-03-31: 4 mg via INTRAVENOUS
  Filled 2015-03-31: qty 100

## 2015-03-31 NOTE — Progress Notes (Signed)
Hematology and Oncology Follow Up Visit  Gene Hill [id number] [birth year] 75 y.o. 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 is back for followup. He really looks quite good. He has had no problems with the Pomalidomide or Ninlaro. He's had no significant fatigue. He is still doing a lot of work outside in his garden. He has done his spring planting already.   Thankfully, they have had no further problems getting the Pomalidomide/Ninlaro.   We last saw him in February, his monoclonal spike was stable at 0.9. His IgA level was 1536 mg/dL.   He's had no bone pain. He's had no mouth sores. He's had no diarrhea. He's had no rashes.   Overall, his performance status is ECOG 1.   Medications:  Current outpatient prescriptions:  .  acetaminophen (TYLENOL) 500 MG tablet, Take 1,000 mg by mouth at bedtime., Disp: , Rfl:  .  aspirin EC 325 MG tablet, Take 325 mg by mouth daily. , Disp: , Rfl:  .  beta carotene w/minerals (OCUVITE) tablet, Take 1 tablet by mouth daily., Disp: , Rfl:  .  Cholecalciferol (VITAMIN D3) 5000 UNITS TABS, Take 5,000 Units by mouth daily., Disp: , Rfl:  .  Cinnamon 500 MG capsule, Take 500 mg by mouth daily. , Disp: , Rfl:  .  ixazomib citrate (NINLARO) 3 MG capsule, Take 1 capsule (3 mg total) by mouth once a week. Take on an empty stomach 1hr before or 2hrs after food. Do not crush, chew, or open., Disp: 3 capsule, Rfl: 6 .  metoprolol (LOPRESSOR) 100 MG tablet, Take 0.5 tablets (50 mg total) by mouth 2 (two) times daily., Disp: 90 tablet, Rfl: 2 .  Multiple Vitamin (MULTIVITAMIN WITH MINERALS) TABS tablet, Take 1 tablet by mouth daily., Disp: , Rfl:  .  naproxen sodium (ANAPROX) 220 MG tablet, Take 220 mg by mouth 2  (two) times daily with a meal. , Disp: , Rfl:  .  niacin 500 MG tablet, Take 500 mg by mouth daily. , Disp: , Rfl:  .  Omega-3 Fatty Acids (FISH OIL) 1200 MG CAPS, Take 1,200 mg by mouth daily., Disp: , Rfl:  .  pantoprazole (PROTONIX) 40 MG tablet, Take 40 mg by mouth daily., Disp: , Rfl:  .  pomalidomide (POMALYST) 3 MG capsule, Take 1 capsule (3 mg total) by mouth daily. Take with water on days 1-21. Repeat every 28 days. AUTH# [id number], Disp: 21 capsule, Rfl: 0 .  Probiotic Product (PROBIOTIC PO), Take 1 tablet by mouth daily. , Disp: , Rfl:  .  pyridOXINE (VITAMIN B-6) 100 MG tablet, Take 100 mg by mouth daily., Disp: , Rfl:   Allergies: No Known Allergies  Past Medical History, Surgical history, Social history, and Family History were reviewed and updated.  Review of Systems: As above  Physical Exam:  height is 5\' 7"  (1.702 m) and weight is 224 lb (101.606 kg). His oral temperature is 98.1 F (36.7 C). His blood pressure is 131/69 and his pulse is 67. His respiration is 16.  Well-developed and well-nourished white gentleman. Head and neck exam shows no ocular or oral lesions. He has  no palpable lymph nodes. Lungs are clear. Cardiac exam regular rate and rhythm with no murmurs rubs or bruits. Abdomen is soft. He's mildly obese. He has good bowel sounds. There is no fluid wave. There is no palpable liver or spleen tip. Back exam shows no tenderness over the spine ribs or hips. Extremities shows no clubbing cyanosis or edema. Has good range of motion of his joints. Has good muscle strength. Skin exam no rashes. Neurological exam is nonfocal.  Lab Results  Component Value Date   WBC 2.3* 03/31/2015   HGB 11.5* 03/31/2015   HCT 34.8* 03/31/2015   MCV 103* 03/31/2015   PLT 178 03/31/2015     Chemistry      Component Value Date/Time   NA 140 03/31/2015 1032   NA 137 12/08/2014 1512   NA 141 11/03/2014 1336   K 4.0 03/31/2015 1032   K 3.8 12/08/2014 1512   K 4.0 11/03/2014 1336   CL  103 03/31/2015 1032   CL 102 12/08/2014 1512   CO2 30 03/31/2015 1032   CO2 23 12/08/2014 1512   CO2 26 11/03/2014 1336   BUN 14 03/31/2015 1032   BUN 16 12/08/2014 1512   BUN 15.1 11/03/2014 1336   CREATININE 1.2 03/31/2015 1032   CREATININE 0.90 12/08/2014 1512   CREATININE 0.9 11/03/2014 1336      Component Value Date/Time   CALCIUM 9.6 03/31/2015 1032   CALCIUM 9.3 12/08/2014 1512   CALCIUM 9.6 11/03/2014 1336   ALKPHOS 32 03/31/2015 1032   ALKPHOS 35* 12/08/2014 1512   ALKPHOS 41 11/03/2014 1336   AST 28 03/31/2015 1032   AST 28 12/08/2014 1512   AST 35* 11/03/2014 1336   ALT 35 03/31/2015 1032   ALT 37 12/08/2014 1512   ALT 49 11/03/2014 1336   BILITOT 0.70 03/31/2015 1032   BILITOT 0.3 12/08/2014 1512   BILITOT 0.37 11/03/2014 1336       Impression and Plan: Gene Hill is a 75 year old gentleman with history of IgA kappa myeloma. 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 He was treated with standard therapy and got into remission. 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  His relapsed myeloma is certainly high-risk given the chromosomal abnormalities..  Hopefully, we will see that he is responding. I will like to think that his chance responding should be about 70%.   I spent about 45 minutes with he has wife today. Most the time, I listened to the aggravation that they had trying to get the medicine . Thankfully it has been covered.   I will plan to see him back in one month.  We will give Zometa today. After today, we will go every 3 months. [name], MD 3/9/201712:25 PM

## 2015-03-31 NOTE — Patient Instructions (Signed)

## 2015-04-01 LAB — IGG, IGA, IGM
IGG (IMMUNOGLOBIN G), SERUM: 416 mg/dL — AB (ref 700–1600)
IgA, Qn, Serum: 2052 mg/dL — ABNORMAL HIGH (ref 61–437)
IgM, Qn, Serum: 11 mg/dL — ABNORMAL LOW (ref 15–143)

## 2015-04-01 LAB — KAPPA/LAMBDA LIGHT CHAINS
IG LAMBDA FREE LIGHT CHAIN: 11.31 mg/L (ref 5.71–26.30)
Ig Kappa Free Light Chain: 60.22 mg/L — ABNORMAL HIGH (ref 3.30–19.40)
KAPPA/LAMBDA FLC RATIO: 5.32 — AB (ref 0.26–1.65)

## 2015-04-05 LAB — PROTEIN ELECTROPHORESIS, SERUM, WITH REFLEX
A/G RATIO SPE: 0.9 (ref 0.7–1.7)
ALPHA 2: 0.8 g/dL (ref 0.4–1.0)
Albumin: 3.5 g/dL (ref 2.9–4.4)
Alpha 1: 0.2 g/dL (ref 0.0–0.4)
BETA: 1.1 g/dL (ref 0.7–1.3)
GAMMA GLOBULIN: 1.7 g/dL (ref 0.4–1.8)
GLOBULIN, TOTAL: 3.8 g/dL (ref 2.2–3.9)
Interpretation(See Below): 0
M-SPIKE, %: 0.6 g/dL — AB
TOTAL PROTEIN: 7.3 g/dL (ref 6.0–8.5)

## 2015-04-06 ENCOUNTER — Telehealth: Payer: Self-pay

## 2015-04-06 NOTE — Telephone Encounter (Addendum)
-----   Message from [name], MD sent at 04/06/2015  7:39 AM EDT ----- Call - myeloma continues to improve.  The myeloma protein went from 0.9 to 0.6!!!  Gene Hill  Attempted to contact patient with above message x 3. Phone is busy. Will continue to attempt. dph

## 2015-04-08 DIAGNOSIS — M25561 Pain in right knee: Secondary | ICD-10-CM | POA: Diagnosis not present

## 2015-04-14 ENCOUNTER — Telehealth: Payer: Self-pay | Admitting: Internal Medicine

## 2015-04-14 MED ORDER — PANTOPRAZOLE SODIUM 40 MG PO TBEC: 40.0000 mg | DELAYED_RELEASE_TABLET | Freq: Every day | ORAL | Status: DC

## 2015-04-14 NOTE — Telephone Encounter (Signed)
Rx sent 

## 2015-04-14 NOTE — Telephone Encounter (Signed)
Caller name: Bartolo Darter Relationship to patient: Wife Can be reached: [phone] Pharmacy:  [id number] - [place], [place] [phone] (Phone) [phone] (Fax)        Reason for call: Request refill on pantoprazole (PROTONIX) 40 MG tablet [id number] and would like a 1 year refill supply if its allowed on this medication.

## 2015-04-15 ENCOUNTER — Other Ambulatory Visit: Payer: Self-pay | Admitting: *Deleted

## 2015-04-15 DIAGNOSIS — C9002 Multiple myeloma in relapse: Secondary | ICD-10-CM

## 2015-04-15 MED ORDER — POMALIDOMIDE 3 MG PO CAPS: 3.0000 mg | ORAL_CAPSULE | Freq: Every day | ORAL | Status: DC

## 2015-04-18 ENCOUNTER — Other Ambulatory Visit: Payer: Self-pay

## 2015-04-18 MED ORDER — PANTOPRAZOLE SODIUM 40 MG PO TBEC: 40.0000 mg | DELAYED_RELEASE_TABLET | Freq: Every day | ORAL | Status: DC

## 2015-05-09 ENCOUNTER — Ambulatory Visit: Payer: PPO | Admitting: Hematology & Oncology

## 2015-05-09 ENCOUNTER — Other Ambulatory Visit: Payer: PPO

## 2015-05-12 ENCOUNTER — Other Ambulatory Visit: Payer: Self-pay | Admitting: *Deleted

## 2015-05-12 ENCOUNTER — Other Ambulatory Visit: Payer: Self-pay | Admitting: Hematology & Oncology

## 2015-05-12 MED ORDER — POMALYST 3 MG PO CAPS: ORAL_CAPSULE | ORAL | Status: DC

## 2015-05-13 ENCOUNTER — Encounter: Payer: Self-pay | Admitting: Hematology & Oncology

## 2015-05-13 ENCOUNTER — Telehealth: Payer: Self-pay | Admitting: *Deleted

## 2015-05-13 ENCOUNTER — Other Ambulatory Visit (HOSPITAL_BASED_OUTPATIENT_CLINIC_OR_DEPARTMENT_OTHER): Payer: PPO

## 2015-05-13 ENCOUNTER — Ambulatory Visit (HOSPITAL_BASED_OUTPATIENT_CLINIC_OR_DEPARTMENT_OTHER): Payer: PPO | Admitting: Hematology & Oncology

## 2015-05-13 VITALS — BP 149/64 | HR 82 | Temp 97.8°F | Wt 225.0 lb

## 2015-05-13 DIAGNOSIS — C9002 Multiple myeloma in relapse: Secondary | ICD-10-CM | POA: Diagnosis not present

## 2015-05-13 LAB — CBC WITH DIFFERENTIAL (CANCER CENTER ONLY)
BASO#: 0.1 10*3/uL (ref 0.0–0.2)
BASO%: 1.9 % (ref 0.0–2.0)
EOS ABS: 0.3 10*3/uL (ref 0.0–0.5)
EOS%: 9.7 % — ABNORMAL HIGH (ref 0.0–7.0)
HCT: 35.4 % — ABNORMAL LOW (ref 38.7–49.9)
HGB: 12.1 g/dL — ABNORMAL LOW (ref 13.0–17.1)
LYMPH#: 1 10*3/uL (ref 0.9–3.3)
LYMPH%: 38.8 % (ref 14.0–48.0)
MCH: 35.3 pg — ABNORMAL HIGH (ref 28.0–33.4)
MCHC: 34.2 g/dL (ref 32.0–35.9)
MCV: 103 fL — AB (ref 82–98)
MONO#: 0.8 10*3/uL (ref 0.1–0.9)
MONO%: 29.9 % — ABNORMAL HIGH (ref 0.0–13.0)
NEUT#: 0.5 10*3/uL — CL (ref 1.5–6.5)
NEUT%: 19.7 % — ABNORMAL LOW (ref 40.0–80.0)
PLATELETS: 91 10*3/uL — AB (ref 145–400)
RBC: 3.43 10*6/uL — AB (ref 4.20–5.70)
RDW: 15.9 % — ABNORMAL HIGH (ref 11.1–15.7)
WBC: 2.7 10*3/uL — AB (ref 4.0–10.0)

## 2015-05-13 LAB — COMPREHENSIVE METABOLIC PANEL
ALBUMIN: 3.6 g/dL (ref 3.5–5.0)
ALK PHOS: 40 U/L (ref 40–150)
ALT: 26 U/L (ref 0–55)
ANION GAP: 11 meq/L (ref 3–11)
AST: 19 U/L (ref 5–34)
BUN: 18.3 mg/dL (ref 7.0–26.0)
CO2: 24 mEq/L (ref 22–29)
Calcium: 9.1 mg/dL (ref 8.4–10.4)
Chloride: 106 mEq/L (ref 98–109)
Creatinine: 0.9 mg/dL (ref 0.7–1.3)
EGFR: 83 mL/min/{1.73_m2} — AB (ref >90)
Glucose: 93 mg/dl (ref 70–140)
Potassium: 4.2 mEq/L (ref 3.5–5.1)
Sodium: 141 mEq/L (ref 136–145)
TOTAL PROTEIN: 7.3 g/dL (ref 6.4–8.3)

## 2015-05-13 NOTE — Telephone Encounter (Signed)
Critical Value ANC 0.5 Dr Ennever notified. No orders at this time 

## 2015-05-14 LAB — IGG, IGA, IGM
IGM (IMMUNOGLOBIN M), SRM: 13 mg/dL — AB (ref 15–143)
IgA, Qn, Serum: 1353 mg/dL — ABNORMAL HIGH (ref 61–437)
IgG, Qn, Serum: 411 mg/dL — ABNORMAL LOW (ref 700–1600)

## 2015-05-15 NOTE — Progress Notes (Signed)
Hematology and Oncology Follow Up Visit  Gene Hill [id number] June 25, 1940 75 y.o. 05/15/2015   Principle Diagnosis:   IgA kappa myeloma - relapsed post ASCT (+4, +11, 13q- and 17p-)   Current Therapy:    Pomalidomide 3 mg po q day (21/7) - s/p c#4  Ninlaro 3 mg po q wk (3/1) - s/p c#3  Zometa 4 mg IV q  3 months - next dose 06/2015     Interim History:  [name] is back for followup. He really looks quite good. He has had no problems with the Pomalidomide or Ninlaro. He's had no significant fatigue. He is still doing a lot of work outside in his garden. He has done his spring planting already.   Thankfully, they have had no further problems getting the Pomalidomide/Ninlaro.   We last saw him in March, his monoclonal spike was stable at 0.6. His IgA level was 1390 mg/dL.   He's had no bone pain. He's had no mouth sores. He's had no diarrhea. He's had no rashes.   Overall, his performance status is ECOG 1.   Medications:  Current outpatient prescriptions:  .  acetaminophen (TYLENOL) 500 MG tablet, Take 1,000 mg by mouth at bedtime., Disp: , Rfl:  .  aspirin EC 325 MG tablet, Take 325 mg by mouth daily. , Disp: , Rfl:  .  beta carotene w/minerals (OCUVITE) tablet, Take 1 tablet by mouth daily., Disp: , Rfl:  .  Cholecalciferol (VITAMIN D3) 5000 UNITS TABS, Take 5,000 Units by mouth daily., Disp: , Rfl:  .  Cinnamon 500 MG capsule, Take 500 mg by mouth daily. , Disp: , Rfl:  .  ixazomib citrate (NINLARO) 3 MG capsule, Take 1 capsule (3 mg total) by mouth once a week. Take on an empty stomach 1hr before or 2hrs after food. Do not crush, chew, or open., Disp: 3 capsule, Rfl: 6 .  metoprolol (LOPRESSOR) 100 MG tablet, Take 0.5 tablets (50 mg total) by mouth 2 (two) times daily., Disp: 90 tablet, Rfl: 2 .  Multiple Vitamin (MULTIVITAMIN WITH MINERALS) TABS tablet, Take 1 tablet by mouth daily., Disp: , Rfl:  .  naproxen sodium (ANAPROX) 220 MG tablet, Take 220 mg by mouth 2  (two) times daily with a meal. , Disp: , Rfl:  .  niacin 500 MG tablet, Take 500 mg by mouth daily. , Disp: , Rfl:  .  Omega-3 Fatty Acids (FISH OIL) 1200 MG CAPS, Take 1,200 mg by mouth daily., Disp: , Rfl:  .  pantoprazole (PROTONIX) 40 MG tablet, Take 1 tablet (40 mg total) by mouth daily., Disp: 90 tablet, Rfl: 3 .  POMALYST 3 MG capsule, Take 1 capsule by mouth daily. Take with water on days 1-21. Repeat every 28 days. [id number], Disp: 21 capsule, Rfl: 0 .  Probiotic Product (PROBIOTIC PO), Take 1 tablet by mouth daily. , Disp: , Rfl:  .  pyridOXINE (VITAMIN B-6) 100 MG tablet, Take 100 mg by mouth daily., Disp: , Rfl:   Allergies: No Known Allergies  Past Medical History, Surgical history, Social history, and Family History were reviewed and updated.  Review of Systems: As above  Physical Exam:  weight is 225 lb (102.059 kg). His oral temperature is 97.8 F (36.6 C). His blood pressure is 149/64 and his pulse is 82.  Well-developed and well-nourished white gentleman. Head and neck exam shows no ocular or oral lesions. He has no palpable lymph nodes. Lungs are clear. Cardiac exam regular rate and  rhythm with no murmurs rubs or bruits. Abdomen is soft. He's mildly obese. He has good bowel sounds. There is no fluid wave. There is no palpable liver or spleen tip. Back exam shows no tenderness over the spine ribs or hips. Extremities shows no clubbing cyanosis or edema. Has good range of motion of his joints. Has good muscle strength. Skin exam no rashes. Neurological exam is nonfocal.  Lab Results  Component Value Date   WBC 2.7* 05/13/2015   HGB 12.1* 05/13/2015   HCT 35.4* 05/13/2015   MCV 103* 05/13/2015   PLT 91* 05/13/2015     Chemistry      Component Value Date/Time   NA 141 05/13/2015 0818   NA 140 03/31/2015 1032   NA 137 12/08/2014 1512   K 4.2 05/13/2015 0818   K 4.0 03/31/2015 1032   K 3.8 12/08/2014 1512   CL 103 03/31/2015 1032   CL 102 12/08/2014 1512   CO2  24 05/13/2015 0818   CO2 30 03/31/2015 1032   CO2 23 12/08/2014 1512   BUN 18.3 05/13/2015 0818   BUN 14 03/31/2015 1032   BUN 16 12/08/2014 1512   CREATININE 0.9 05/13/2015 0818   CREATININE 1.2 03/31/2015 1032   CREATININE 0.90 12/08/2014 1512      Component Value Date/Time   CALCIUM 9.1 05/13/2015 0818   CALCIUM 9.6 03/31/2015 1032   CALCIUM 9.3 12/08/2014 1512   ALKPHOS 40 05/13/2015 0818   ALKPHOS 32 03/31/2015 1032   ALKPHOS 35* 12/08/2014 1512   AST 19 05/13/2015 0818   AST 28 03/31/2015 1032   AST 28 12/08/2014 1512   ALT 26 05/13/2015 0818   ALT 35 03/31/2015 1032   ALT 37 12/08/2014 1512   BILITOT <0.30 05/13/2015 0818   BILITOT 0.70 03/31/2015 1032   BILITOT 0.3 12/08/2014 1512       Impression and Plan: Gene Hill is a 75 year old gentleman with history of IgA kappa myeloma. 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 He was treated with standard therapy and got into remission. 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  His relapsed myeloma is certainly high-risk given the chromosomal abnormalities.Marland Kitchen  He is responding to treatment. His myeloma levels have improved. I am just happy that he is tolerating treatment well.  His quality of life is doing quite good.  We will plan to get him back in about 6 weeks.  We will go ahead and give him Zometa when we see him back. [name], MD 4/23/20179:57 AM

## 2015-05-16 LAB — KAPPA/LAMBDA LIGHT CHAINS
IG LAMBDA FREE LIGHT CHAIN: 15.6 mg/L (ref 5.71–26.30)
Ig Kappa Free Light Chain: 61.65 mg/L — ABNORMAL HIGH (ref 3.30–19.40)
Kappa/Lambda FluidC Ratio: 3.95 — ABNORMAL HIGH (ref 0.26–1.65)

## 2015-05-17 LAB — PROTEIN ELECTROPHORESIS, SERUM, WITH REFLEX
A/G Ratio: 1 (ref 0.7–1.7)
Albumin: 3.4 g/dL (ref 2.9–4.4)
Alpha 1: 0.2 g/dL (ref 0.0–0.4)
Alpha 2: 0.7 g/dL (ref 0.4–1.0)
Beta: 1.8 g/dL — ABNORMAL HIGH (ref 0.7–1.3)
Gamma Globulin: 0.6 g/dL (ref 0.4–1.8)
Globulin, Total: 3.3 g/dL (ref 2.2–3.9)
INTERPRETATION(SEE BELOW): 0
M-SPIKE, %: 0.7 g/dL — AB
Total Protein: 6.7 g/dL (ref 6.0–8.5)

## 2015-05-18 ENCOUNTER — Telehealth: Payer: Self-pay | Admitting: *Deleted

## 2015-05-18 NOTE — Telephone Encounter (Signed)
Gave patients wife information that myeloma is low and stable.  Patient wife very appreciative.

## 2015-05-18 NOTE — Telephone Encounter (Signed)
-----   Message from [name], MD sent at 05/18/2015  7:03 AM EDT ----- Call - myeloma is low and stable!!!  pete

## 2015-06-01 ENCOUNTER — Other Ambulatory Visit: Payer: Self-pay | Admitting: *Deleted

## 2015-06-01 MED ORDER — POMALYST 3 MG PO CAPS: ORAL_CAPSULE | ORAL | Status: DC

## 2015-07-01 ENCOUNTER — Ambulatory Visit (HOSPITAL_BASED_OUTPATIENT_CLINIC_OR_DEPARTMENT_OTHER): Payer: PPO

## 2015-07-01 ENCOUNTER — Other Ambulatory Visit (HOSPITAL_BASED_OUTPATIENT_CLINIC_OR_DEPARTMENT_OTHER): Payer: PPO

## 2015-07-01 ENCOUNTER — Encounter: Payer: Self-pay | Admitting: Hematology & Oncology

## 2015-07-01 ENCOUNTER — Other Ambulatory Visit: Payer: Self-pay | Admitting: *Deleted

## 2015-07-01 ENCOUNTER — Ambulatory Visit (HOSPITAL_BASED_OUTPATIENT_CLINIC_OR_DEPARTMENT_OTHER): Payer: PPO | Admitting: Hematology & Oncology

## 2015-07-01 VITALS — BP 137/64 | HR 67 | Temp 97.8°F | Resp 18 | Ht 67.0 in | Wt 221.0 lb

## 2015-07-01 DIAGNOSIS — E349 Endocrine disorder, unspecified: Secondary | ICD-10-CM

## 2015-07-01 DIAGNOSIS — C9002 Multiple myeloma in relapse: Secondary | ICD-10-CM

## 2015-07-01 LAB — CMP (CANCER CENTER ONLY)
ALBUMIN: 3.3 g/dL (ref 3.3–5.5)
ALK PHOS: 43 U/L (ref 26–84)
ALT: 33 U/L (ref 10–47)
AST: 23 U/L (ref 11–38)
BILIRUBIN TOTAL: 0.7 mg/dL (ref 0.20–1.60)
BUN, Bld: 12 mg/dL (ref 7–22)
CO2: 27 meq/L (ref 18–33)
Calcium: 9.3 mg/dL (ref 8.0–10.3)
Chloride: 102 mEq/L (ref 98–108)
Creat: 0.9 mg/dl (ref 0.6–1.2)
GLUCOSE: 114 mg/dL (ref 73–118)
POTASSIUM: 3.9 meq/L (ref 3.3–4.7)
Sodium: 137 mEq/L (ref 128–145)
Total Protein: 7.1 g/dL (ref 6.4–8.1)

## 2015-07-01 LAB — CBC WITH DIFFERENTIAL (CANCER CENTER ONLY)
BASO#: 0.1 10*3/uL (ref 0.0–0.2)
BASO%: 0.9 % (ref 0.0–2.0)
EOS%: 1.8 % (ref 0.0–7.0)
Eosinophils Absolute: 0.1 10*3/uL (ref 0.0–0.5)
HEMATOCRIT: 36.4 % — AB (ref 38.7–49.9)
HEMOGLOBIN: 12 g/dL — AB (ref 13.0–17.1)
LYMPH#: 1 10*3/uL (ref 0.9–3.3)
LYMPH%: 17.9 % (ref 14.0–48.0)
MCH: 34.7 pg — ABNORMAL HIGH (ref 28.0–33.4)
MCHC: 33 g/dL (ref 32.0–35.9)
MCV: 105 fL — AB (ref 82–98)
MONO#: 1.2 10*3/uL — ABNORMAL HIGH (ref 0.1–0.9)
MONO%: 20.6 % — ABNORMAL HIGH (ref 0.0–13.0)
NEUT%: 58.8 % (ref 40.0–80.0)
NEUTROS ABS: 3.3 10*3/uL (ref 1.5–6.5)
Platelets: 151 10*3/uL (ref 145–400)
RBC: 3.46 10*6/uL — ABNORMAL LOW (ref 4.20–5.70)
RDW: 16.1 % — AB (ref 11.1–15.7)
WBC: 5.6 10*3/uL (ref 4.0–10.0)

## 2015-07-01 MED ORDER — ZOLEDRONIC ACID 4 MG/100ML IV SOLN
4.0000 mg | Freq: Once | INTRAVENOUS | Status: AC
  Administered 2015-07-01: 4 mg via INTRAVENOUS
  Filled 2015-07-01: qty 100

## 2015-07-01 MED ORDER — POMALYST 3 MG PO CAPS: ORAL_CAPSULE | ORAL | Status: DC

## 2015-07-01 NOTE — Patient Instructions (Signed)

## 2015-07-01 NOTE — Progress Notes (Signed)
Hematology and Oncology Follow Up Visit  Gene Hill [id number] 1940-05-03 75 y.o. 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 is back for followup. He really looks quite good. He has had no problems with the Pomalidomide or Ninlaro. He says he gets constipated with the Ninlaro. He's had no significant fatigue. He is still doing a lot of work outside in his garden. He is very kind and Gene Hill some garlic and potatoes today. With all the rain we have had, he has saved a lot of rainwater. Hopefully his garden will be able to be incredibly fruitful.   Thankfully, they have had no further problems getting the Pomalidomide/Ninlaro.   We last saw him in April, his monoclonal spike was stable at 0.7. His IgA level was 1353 mg/dL. his Kappa Lightchain was 6.2 mg/dL.   He's had no bone pain. He's had no mouth sores. He's had no diarrhea. He's had no rashes.   Overall, his performance status is ECOG 1.   Medications:  Current outpatient prescriptions:  .  acetaminophen (TYLENOL) 500 MG tablet, Take 1,000 mg by mouth at bedtime., Disp: , Rfl:  .  aspirin EC 325 MG tablet, Take 325 mg by mouth daily. , Disp: , Rfl:  .  beta carotene w/minerals (OCUVITE) tablet, Take 1 tablet by mouth daily., Disp: , Rfl:  .  Cholecalciferol (VITAMIN D3) 5000 UNITS TABS, Take 5,000 Units by mouth daily., Disp: , Rfl:  .  Cinnamon 500 MG capsule, Take 500 mg by mouth daily. , Disp: , Rfl:  .  ixazomib citrate (NINLARO) 3 MG capsule, Take 1 capsule (3 mg total) by mouth once a week. Take on an empty stomach 1hr before or 2hrs after food. Do not crush, chew, or open., Disp: 3 capsule, Rfl: 6 .  metoprolol (LOPRESSOR) 100 MG tablet, Take 0.5 tablets (50 mg total) by mouth 2  (two) times daily., Disp: 90 tablet, Rfl: 2 .  Multiple Vitamin (MULTIVITAMIN WITH MINERALS) TABS tablet, Take 1 tablet by mouth daily., Disp: , Rfl:  .  naproxen sodium (ANAPROX) 220 MG tablet, Take 220 mg by mouth 2 (two) times daily with a meal. , Disp: , Rfl:  .  niacin 500 MG tablet, Take 500 mg by mouth daily. , Disp: , Rfl:  .  Omega-3 Fatty Acids (FISH OIL) 1200 MG CAPS, Take 1,200 mg by mouth daily., Disp: , Rfl:  .  pantoprazole (PROTONIX) 40 MG tablet, Take 1 tablet (40 mg total) by mouth daily., Disp: 90 tablet, Rfl: 3 .  Probiotic Product (PROBIOTIC PO), Take 1 tablet by mouth daily. , Disp: , Rfl:  .  pyridOXINE (VITAMIN B-6) 100 MG tablet, Take 100 mg by mouth daily., Disp: , Rfl:  .  POMALYST 3 MG capsule, Take 1 capsule by mouth daily. Take with water on days 1-21. Repeat every 28 days. [id number], Disp: 21 capsule, Rfl: 0  Allergies: No Known Allergies  Past Medical History, Surgical history, Social history, and Family History were reviewed and updated.  Review of Systems: As above  Physical Exam:  height is 5\' 7"  (1.702 m) and weight is 221 lb (100.245  kg). His oral temperature is 97.8 F (36.6 C). His blood pressure is 137/64 and his pulse is 67. His respiration is 18.  Well-developed and well-nourished white gentleman. Head and neck exam shows no ocular or oral lesions. He has no palpable lymph nodes. Lungs are clear. Cardiac exam regular rate and rhythm with no murmurs rubs or bruits. Abdomen is soft. He's mildly obese. He has good bowel sounds. There is no fluid wave. There is no palpable liver or spleen tip. Back exam shows no tenderness over the spine ribs or hips. Extremities shows no clubbing cyanosis or edema. Has good range of motion of his joints. Has good muscle strength. Skin exam no rashes. Neurological exam is nonfocal.  Lab Results  Component Value Date   WBC 5.6 07/01/2015   HGB 12.0* 07/01/2015   HCT 36.4* 07/01/2015   MCV 105* 07/01/2015   PLT 151  07/01/2015     Chemistry      Component Value Date/Time   NA 137 07/01/2015 1012   NA 141 05/13/2015 0818   NA 137 12/08/2014 1512   K 3.9 07/01/2015 1012   K 4.2 05/13/2015 0818   K 3.8 12/08/2014 1512   CL 102 07/01/2015 1012   CL 102 12/08/2014 1512   CO2 27 07/01/2015 1012   CO2 24 05/13/2015 0818   CO2 23 12/08/2014 1512   BUN 12 07/01/2015 1012   BUN 18.3 05/13/2015 0818   BUN 16 12/08/2014 1512   CREATININE 0.9 07/01/2015 1012   CREATININE 0.9 05/13/2015 0818   CREATININE 0.90 12/08/2014 1512      Component Value Date/Time   CALCIUM 9.3 07/01/2015 1012   CALCIUM 9.1 05/13/2015 0818   CALCIUM 9.3 12/08/2014 1512   ALKPHOS 43 07/01/2015 1012   ALKPHOS 40 05/13/2015 0818   ALKPHOS 35* 12/08/2014 1512   AST 23 07/01/2015 1012   AST 19 05/13/2015 0818   AST 28 12/08/2014 1512   ALT 33 07/01/2015 1012   ALT 26 05/13/2015 0818   ALT 37 12/08/2014 1512   BILITOT 0.70 07/01/2015 1012   BILITOT <0.30 05/13/2015 0818   BILITOT 0.3 12/08/2014 1512       Impression and Plan: Gene Hill is a 75 year old gentleman with history of IgA kappa myeloma. 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 He was treated with standard therapy and got into remission. 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  His relapsed myeloma is certainly high-risk given the chromosomal abnormalities.Marland Kitchen  He is responding to treatment. His myeloma levels have improved. I am just happy that he is tolerating treatment well.  I think that if his myeloma levels are stable, we might be able to consider every 2 week Ninlaro. This might help a little bit for him.  His quality of life is doing quite good.  We will plan to get him back in about 6 weeks.   [name], MD 6/9/201711:15 AM

## 2015-07-02 LAB — IGG, IGA, IGM
IgA, Qn, Serum: 1181 mg/dL — ABNORMAL HIGH (ref 61–437)
IgG, Qn, Serum: 416 mg/dL — ABNORMAL LOW (ref 700–1600)
IgM, Qn, Serum: 14 mg/dL — ABNORMAL LOW (ref 15–143)

## 2015-07-04 LAB — KAPPA/LAMBDA LIGHT CHAINS
Ig Kappa Free Light Chain: 55 mg/L — ABNORMAL HIGH (ref 3.3–19.4)
Ig Lambda Free Light Chain: 14.1 mg/L (ref 5.7–26.3)
Kappa/Lambda FluidC Ratio: 3.9 — ABNORMAL HIGH (ref 0.26–1.65)

## 2015-07-06 ENCOUNTER — Other Ambulatory Visit: Payer: Self-pay | Admitting: Hematology & Oncology

## 2015-07-06 DIAGNOSIS — C9002 Multiple myeloma in relapse: Secondary | ICD-10-CM

## 2015-07-06 LAB — PROTEIN ELECTROPHORESIS, SERUM, WITH REFLEX
A/G RATIO SPE: 1.1 (ref 0.7–1.7)
Albumin: 3.5 g/dL (ref 2.9–4.4)
Alpha 1: 0.2 g/dL (ref 0.0–0.4)
Alpha 2: 0.8 g/dL (ref 0.4–1.0)
BETA: 0.9 g/dL (ref 0.7–1.3)
GAMMA GLOBULIN: 1.4 g/dL (ref 0.4–1.8)
GLOBULIN, TOTAL: 3.3 g/dL (ref 2.2–3.9)
Interpretation(See Below): 0
M-Spike, %: 0.5 g/dL — ABNORMAL HIGH
TOTAL PROTEIN: 6.8 g/dL (ref 6.0–8.5)

## 2015-07-06 MED ORDER — IXAZOMIB CITRATE 3 MG PO CAPS: ORAL_CAPSULE | ORAL | Status: DC

## 2015-07-07 ENCOUNTER — Telehealth: Payer: Self-pay | Admitting: Nurse Practitioner

## 2015-07-07 DIAGNOSIS — C9002 Multiple myeloma in relapse: Secondary | ICD-10-CM

## 2015-07-07 MED ORDER — IXAZOMIB CITRATE 3 MG PO CAPS: ORAL_CAPSULE | ORAL | Status: DC

## 2015-07-07 NOTE — Telephone Encounter (Addendum)
Spoke with wife and she verbalized understanding. RX sent to [facility] ----- Message from [name], MD sent at 07/06/2015  6:31 PM EDT ----- Call- tell him that the myeloma is better. [name] is only 0.5. As such, we can probably change his Ninlaro to every 2 week dosing. I will get a new prescription for that.  Gene Hill

## 2015-08-01 ENCOUNTER — Other Ambulatory Visit: Payer: Self-pay | Admitting: *Deleted

## 2015-08-01 MED ORDER — POMALYST 3 MG PO CAPS: ORAL_CAPSULE | ORAL | Status: DC

## 2015-08-16 ENCOUNTER — Ambulatory Visit (HOSPITAL_BASED_OUTPATIENT_CLINIC_OR_DEPARTMENT_OTHER): Payer: PPO

## 2015-08-16 ENCOUNTER — Other Ambulatory Visit: Payer: Self-pay | Admitting: *Deleted

## 2015-08-16 ENCOUNTER — Other Ambulatory Visit (HOSPITAL_BASED_OUTPATIENT_CLINIC_OR_DEPARTMENT_OTHER): Payer: PPO

## 2015-08-16 ENCOUNTER — Ambulatory Visit (HOSPITAL_BASED_OUTPATIENT_CLINIC_OR_DEPARTMENT_OTHER): Payer: PPO | Admitting: Hematology & Oncology

## 2015-08-16 ENCOUNTER — Encounter: Payer: Self-pay | Admitting: Hematology & Oncology

## 2015-08-16 VITALS — BP 147/76 | HR 80 | Temp 98.3°F | Resp 16 | Ht 67.0 in | Wt 218.0 lb

## 2015-08-16 DIAGNOSIS — M545 Low back pain, unspecified: Secondary | ICD-10-CM

## 2015-08-16 DIAGNOSIS — C9002 Multiple myeloma in relapse: Secondary | ICD-10-CM | POA: Diagnosis not present

## 2015-08-16 DIAGNOSIS — E349 Endocrine disorder, unspecified: Secondary | ICD-10-CM

## 2015-08-16 DIAGNOSIS — C9000 Multiple myeloma not having achieved remission: Secondary | ICD-10-CM

## 2015-08-16 LAB — CMP (CANCER CENTER ONLY)
ALT(SGPT): 29 U/L (ref 10–47)
AST: 23 U/L (ref 11–38)
Albumin: 3.5 g/dL (ref 3.3–5.5)
Alkaline Phosphatase: 36 U/L (ref 26–84)
BUN: 14 mg/dL (ref 7–22)
CALCIUM: 9.1 mg/dL (ref 8.0–10.3)
CHLORIDE: 101 meq/L (ref 98–108)
CO2: 28 mEq/L (ref 18–33)
Creat: 0.7 mg/dl (ref 0.6–1.2)
Glucose, Bld: 99 mg/dL (ref 73–118)
POTASSIUM: 3.9 meq/L (ref 3.3–4.7)
Sodium: 137 mEq/L (ref 128–145)
TOTAL PROTEIN: 7.2 g/dL (ref 6.4–8.1)
Total Bilirubin: 0.7 mg/dl (ref 0.20–1.60)

## 2015-08-16 LAB — CBC WITH DIFFERENTIAL (CANCER CENTER ONLY)
BASO#: 0 10*3/uL (ref 0.0–0.2)
BASO%: 0.6 % (ref 0.0–2.0)
EOS%: 3.1 % (ref 0.0–7.0)
Eosinophils Absolute: 0.1 10*3/uL (ref 0.0–0.5)
HCT: 36.1 % — ABNORMAL LOW (ref 38.7–49.9)
HGB: 12.2 g/dL — ABNORMAL LOW (ref 13.0–17.1)
LYMPH#: 1.2 10*3/uL (ref 0.9–3.3)
LYMPH%: 37.6 % (ref 14.0–48.0)
MCH: 34.8 pg — ABNORMAL HIGH (ref 28.0–33.4)
MCHC: 33.8 g/dL (ref 32.0–35.9)
MCV: 103 fL — AB (ref 82–98)
MONO#: 0.7 10*3/uL (ref 0.1–0.9)
MONO%: 21 % — ABNORMAL HIGH (ref 0.0–13.0)
NEUT%: 37.7 % — ABNORMAL LOW (ref 40.0–80.0)
NEUTROS ABS: 1.2 10*3/uL — AB (ref 1.5–6.5)
PLATELETS: 154 10*3/uL (ref 145–400)
RBC: 3.51 10*6/uL — AB (ref 4.20–5.70)
RDW: 16.2 % — ABNORMAL HIGH (ref 11.1–15.7)
WBC: 3.2 10*3/uL — AB (ref 4.0–10.0)

## 2015-08-16 MED ORDER — ZOLEDRONIC ACID 4 MG/100ML IV SOLN
4.0000 mg | Freq: Once | INTRAVENOUS | Status: AC
  Administered 2015-08-16: 4 mg via INTRAVENOUS
  Filled 2015-08-16: qty 100

## 2015-08-16 MED ORDER — CYCLOBENZAPRINE HCL 5 MG PO TABS: 5.0000 mg | ORAL_TABLET | Freq: Three times a day (TID) | ORAL | 1 refills | Status: AC | PRN

## 2015-08-16 MED ORDER — CYCLOBENZAPRINE HCL 5 MG PO TABS: 5.0000 mg | ORAL_TABLET | Freq: Three times a day (TID) | ORAL | Status: DC | PRN

## 2015-08-16 NOTE — Patient Instructions (Signed)

## 2015-08-16 NOTE — Progress Notes (Signed)
Hematology and Oncology Follow Up Visit  Gene Hill [id number] [birth year] 75 y.o. 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  Gene Hill is back for followup. He really looks quite good. He has had no problems with the Pomalidomide or Ninlaro. He says he gets constipated with the Ninlaro. He's had no significant fatigue. He is still doing a lot of work outside in his garden. He is very kind and Bryson some garlic and potatoes today. With all the rain we have had, he has saved a lot of rainwater. Hopefully his garden will be able to be incredibly fruitful.   Thankfully, they have had no further problems getting the Pomalidomide/Ninlaro.   We last saw him inJune his monoclonal spike was stable at 0.5. His IgA level was 1181g/dL. his Kappa Lightchain was 5.5 mg/dL.   He's had no bone pain. He's had no mouth sores. He's had no diarrhea. He's had no rashes.   Overall, his performance status is ECOG 1.   Medications:  Current Outpatient Prescriptions:  .  acetaminophen (TYLENOL) 500 MG tablet, Take 1,000 mg by mouth at bedtime., Disp: , Rfl:  .  aspirin EC 325 MG tablet, Take 325 mg by mouth daily. , Disp: , Rfl:  .  beta carotene w/minerals (OCUVITE) tablet, Take 1 tablet by mouth daily., Disp: , Rfl:  .  Cholecalciferol (VITAMIN D3) 5000 UNITS TABS, Take 5,000 Units by mouth daily., Disp: , Rfl:  .  Cinnamon 500 MG capsule, Take 500 mg by mouth daily. , Disp: , Rfl:  .  ixazomib citrate (NINLARO) 3 MG capsule, Take 1 capsule every 2 weeks on an empty stomach., Disp: 4 capsule, Rfl: 6 .  metoprolol (LOPRESSOR) 100 MG tablet, Take 0.5 tablets (50 mg total) by mouth 2 (two) times daily., Disp: 90 tablet, Rfl: 2 .  Multiple Vitamin (MULTIVITAMIN WITH  MINERALS) TABS tablet, Take 1 tablet by mouth daily., Disp: , Rfl:  .  naproxen sodium (ANAPROX) 220 MG tablet, Take 220 mg by mouth 2 (two) times daily with a meal. , Disp: , Rfl:  .  niacin 500 MG tablet, Take 500 mg by mouth daily. , Disp: , Rfl:  .  Omega-3 Fatty Acids (FISH OIL) 1200 MG CAPS, Take 1,200 mg by mouth daily., Disp: , Rfl:  .  pantoprazole (PROTONIX) 40 MG tablet, Take 1 tablet (40 mg total) by mouth daily., Disp: 90 tablet, Rfl: 3 .  POMALYST 3 MG capsule, Take 1 capsule by mouth daily. Take with water on days 1-21. Repeat every 28 days. AUTH# [id number], Disp: 21 capsule, Rfl: 0 .  Probiotic Product (PROBIOTIC PO), Take 1 tablet by mouth daily. , Disp: , Rfl:  .  pyridOXINE (VITAMIN B-6) 100 MG tablet, Take 100 mg by mouth daily., Disp: , Rfl:  .  cyclobenzaprine (FLEXERIL) 5 MG tablet, Take 1 tablet (5 mg total) by mouth 3 (three) times daily as needed for muscle spasms., Disp: 270 tablet, Rfl: 1  Current Facility-Administered Medications:  .  cyclobenzaprine (FLEXERIL) tablet 5 mg, 5 mg, Oral, TID PRN, [name], MD  Allergies: No Known Allergies  Past Medical History, Surgical history,  Social history, and Family History were reviewed and updated.  Review of Systems: As above  Physical Exam:  height is 5\' 7"  (1.702 m) and weight is 218 lb (98.9 kg). His oral temperature is 98.3 F (36.8 C). His blood pressure is 147/76 (abnormal) and his pulse is 80. His respiration is 16.  Well-developed and well-nourished white gentleman. Head and neck exam shows no ocular or oral lesions. He has no palpable lymph nodes. Lungs are clear. Cardiac exam regular rate and rhythm with no murmurs rubs or bruits. Abdomen is soft. He's mildly obese. He has good bowel sounds. There is no fluid wave. There is no palpable liver or spleen tip. Back exam shows no tenderness over the spine ribs or hips. Extremities shows no clubbing cyanosis or edema. Has good range of motion of his joints. Has  good muscle strength. Skin exam no rashes. Neurological exam is nonfocal.  Lab Results  Component Value Date   WBC 3.2 (L) 08/16/2015   HGB 12.2 (L) 08/16/2015   HCT 36.1 (L) 08/16/2015   MCV 103 (H) 08/16/2015   PLT 154 08/16/2015     Chemistry      Component Value Date/Time   NA 137 08/16/2015 0910   NA 141 05/13/2015 0818   K 3.9 08/16/2015 0910   K 4.2 05/13/2015 0818   CL 101 08/16/2015 0910   CO2 28 08/16/2015 0910   CO2 24 05/13/2015 0818   BUN 14 08/16/2015 0910   BUN 18.3 05/13/2015 0818   CREATININE 0.7 08/16/2015 0910   CREATININE 0.9 05/13/2015 0818      Component Value Date/Time   CALCIUM 9.1 08/16/2015 0910   CALCIUM 9.1 05/13/2015 0818   ALKPHOS 36 08/16/2015 0910   ALKPHOS 40 05/13/2015 0818   AST 23 08/16/2015 0910   AST 19 05/13/2015 0818   ALT 29 08/16/2015 0910   ALT 26 05/13/2015 0818   BILITOT 0.70 08/16/2015 0910   BILITOT <0.30 05/13/2015 0818       Impression and Plan: [name] is a 75 year old gentleman with history of IgA kappa myeloma. 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 He was treated with standard therapy and got into remission. 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  His relapsed myeloma is certainly high-risk given the chromosomal abnormalities.Marland Kitchen  He is responding to treatment. His myeloma levels have improved. I am just happy that he is tolerating treatment well.  I think that if his myeloma levels are stable, we might be able to keep him on every 2 week Ninlaro. This might help a little bit for him.  His quality of life is doing quite good.  We will plan to get him back in about 6 weeks.   [name], MD 7/25/201711:28 AM

## 2015-08-17 LAB — IGG, IGA, IGM
IGG (IMMUNOGLOBIN G), SERUM: 494 mg/dL — AB (ref 700–1600)
IgA, Qn, Serum: 1758 mg/dL — ABNORMAL HIGH (ref 61–437)
IgM, Qn, Serum: 14 mg/dL — ABNORMAL LOW (ref 15–143)

## 2015-08-17 LAB — KAPPA/LAMBDA LIGHT CHAINS
IG KAPPA FREE LIGHT CHAIN: 42.6 mg/L — AB (ref 3.3–19.4)
Ig Lambda Free Light Chain: 12.5 mg/L (ref 5.7–26.3)
Kappa/Lambda FluidC Ratio: 3.41 — ABNORMAL HIGH (ref 0.26–1.65)

## 2015-08-18 LAB — PROTEIN ELECTROPHORESIS, SERUM, WITH REFLEX
A/G Ratio: 0.9 (ref 0.7–1.7)
ALPHA 1: 0.2 g/dL (ref 0.0–0.4)
ALPHA 2: 0.8 g/dL (ref 0.4–1.0)
Albumin: 3.4 g/dL (ref 2.9–4.4)
Beta: 1.1 g/dL (ref 0.7–1.3)
GAMMA GLOBULIN: 1.6 g/dL (ref 0.4–1.8)
GLOBULIN, TOTAL: 3.7 g/dL (ref 2.2–3.9)
Interpretation(See Below): 0
M-SPIKE, %: 0.7 g/dL — AB
Total Protein: 7.1 g/dL (ref 6.0–8.5)

## 2015-08-19 DIAGNOSIS — M25561 Pain in right knee: Secondary | ICD-10-CM | POA: Diagnosis not present

## 2015-08-22 ENCOUNTER — Ambulatory Visit (INDEPENDENT_AMBULATORY_CARE_PROVIDER_SITE_OTHER): Payer: PPO | Admitting: Internal Medicine

## 2015-08-22 ENCOUNTER — Encounter: Payer: Self-pay | Admitting: Internal Medicine

## 2015-08-22 VITALS — BP 126/72 | HR 82 | Temp 98.0°F | Resp 14 | Ht 67.0 in | Wt 216.4 lb

## 2015-08-22 DIAGNOSIS — M1990 Unspecified osteoarthritis, unspecified site: Secondary | ICD-10-CM

## 2015-08-22 DIAGNOSIS — I10 Essential (primary) hypertension: Secondary | ICD-10-CM

## 2015-08-22 DIAGNOSIS — Z09 Encounter for follow-up examination after completed treatment for conditions other than malignant neoplasm: Secondary | ICD-10-CM | POA: Insufficient documentation

## 2015-08-22 MED ORDER — TRAMADOL HCL 50 MG PO TABS: 25.0000 mg | ORAL_TABLET | Freq: Two times a day (BID) | ORAL | 0 refills | Status: DC | PRN

## 2015-08-22 NOTE — Progress Notes (Signed)
Pre visit review using our clinic review tool, if applicable. No additional management support is needed unless otherwise documented below in the visit note. 

## 2015-08-22 NOTE — Assessment & Plan Note (Signed)
HTN: Controlled, no change. MM: onco note reviewed, recurrence is high risk, tolerating the treatment. DJD, generalized aches: Currently on OTC Aleve twice a day, Tylenol 2 tablets at night, recently started Flexeril 5 mg daily at bedtime. Sx not completely control. Tramadol?. I agreed to prescribe tramadol, watch for drowsiness, continue with Aleve/Tylenol/Flexeril as needed. RTC around 01-2016 although the patient would like to come later and that is okay.

## 2015-08-22 NOTE — Progress Notes (Signed)
Subjective:    Patient ID: Gene Hill, male    DOB: 1940-02-04, 75 y.o.   MRN: [id number]  DOS:  08/22/2015 Type of visit - description : rov, here with his wife Interval history: In general doing well. Labs reviewed, no need for labs today. Meds reviewed: Good compliance without apparent side effects. Pain is still issue, would like a trial w/traumadol. Takes Aleve twice a day without apparent side effects. MM-Last hematology note reviewed  Review of Systems Denies chest pain or difficulty breathing No nausea, vomiting, diarrhea  Past Medical History:  Diagnosis Date  . Arthritis   . 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 Blood transfusion   . Carotid bruit    3-11 carotid u/s was  (-)  . 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 GERD (gastroesophageal reflux disease)   . HYPERLIPIDEMIA 05/03/2006  . HYPERTENSION 05/03/2006  . 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 PEPTIC ULCER DISEASE 07/22/2008   per EGD 6-10 .Marland Kitchen ulcer duodenitis     Past Surgical History:  Procedure Laterality Date  . CERVICAL FUSION    . [place]   right  . ROTATOR CUFF REPAIR     right  . SPINAL FUSION      Social History   Social History  . Marital status: Married    Spouse name: N/A  . Number of children: 4  . Years of education: N/A   Occupational History  . retired    Social History Main Topics  . Smoking status: Former Smoker    Packs/day: 0.25    Years: 20.00    Types: Cigarettes    Start date: 04/28/1954    Quit date: 11/28/1974  . Smokeless tobacco: Never Used     Comment: quit 35 years ago, 1976  . Alcohol use 0.0 oz/week     Comment: Rarely   . Drug use: No  . Sexual activity: Not on file   Other Topics Concern  . Not on file   Social History Narrative   Household-- pt, wife, son        Medication List       Accurate as of 08/22/15  10:30 AM. Always use your most recent med list.          acetaminophen 500 MG tablet Commonly known as:  TYLENOL Take 1,000 mg by mouth at bedtime.   aspirin EC 325 MG tablet Take 325 mg by mouth daily.   beta carotene w/minerals tablet Take 1 tablet by mouth daily.   Cinnamon 500 MG capsule Take 500 mg by mouth daily.   cyclobenzaprine 5 MG tablet Commonly known as:  FLEXERIL Take 1 tablet (5 mg total) by mouth 3 (three) times daily as needed for muscle spasms.   Fish Oil 1200 MG Caps Take 1,200 mg by mouth daily.   ixazomib citrate 3 MG capsule Commonly known as:  NINLARO Take 1 capsule every 2 weeks on an empty stomach.   metoprolol 100 MG tablet Commonly known as:  LOPRESSOR Take 0.5 tablets (50 mg total) by mouth 2 (two) times daily.   multivitamin with minerals Tabs tablet Take 1 tablet by mouth daily.   naproxen sodium 220 MG tablet Commonly known as:  ANAPROX Take 220 mg by mouth 2 (two) times daily with a meal.   niacin 500 MG tablet Take 500  mg by mouth daily.   pantoprazole 40 MG tablet Commonly known as:  PROTONIX Take 1 tablet (40 mg total) by mouth daily.   POMALYST 3 MG capsule Generic drug:  pomalidomide Take 1 capsule by mouth daily. Take with water on days 1-21. Repeat every 28 days. AUTH# [id number]   PROBIOTIC PO Take 1 tablet by mouth daily.   pyridOXINE 100 MG tablet Commonly known as:  VITAMIN B-6 Take 100 mg by mouth daily.   traMADol 50 MG tablet Commonly known as:  ULTRAM Take 0.5-1 tablets (25-50 mg total) by mouth 2 (two) times daily as needed.   Vitamin D3 5000 units Tabs Take 5,000 Units by mouth daily.          Objective:   Physical Exam BP 126/72 (BP Location: Left Arm, Patient Position: Sitting, Cuff Size: Normal)   Pulse 82   Temp 98 F (36.7 C) (Oral)   Resp 14   Ht 5' 7"  (1.702 m)   Wt 216 lb 6 oz (98.1 kg)   SpO2 95%   BMI 33.89 kg/m  General:   Well developed, well nourished . NAD.  HEENT:    Normocephalic . Face symmetric, atraumatic Lungs:  CTA B Normal respiratory effort, no intercostal retractions, no accessory muscle use. Heart: RRR,  no murmur.  No pretibial edema bilaterally  Skin: Not pale. Not jaundice Neurologic:  alert & oriented X3.  Speech normal, gait appropriate for age and unassisted Psych--  Cognition and judgment appear intact.  Cooperative with normal attention span and concentration.  Behavior appropriate. No anxious or depressed appearing.      Assessment & Plan:  Assessment HTN Hyperlipidemia (self dc  simva 11-2014)  CAD  By cath 1998, medical management, Cardiolite -007  Multiple myeloma -- stem cell transplant 2011, with recurrence as off 01-2015  GI: --Barrett' esophagus EGD 2010, 2013 --PUD 2010  MSK -- generalized aches, pains, chronic (no better after dc simvastatin ~ 11-2014) -- DJD Dr Percell Miller Hypogonadism Carotid bruit, [place] (-) 2011  PLAN  HTN: Controlled, no change. MM: onco note reviewed, recurrence is high risk, tolerating the treatment. DJD, generalized aches: Currently on OTC Aleve twice a day, Tylenol 2 tablets at night, recently started Flexeril 5 mg daily at bedtime. Sx not completely control. Tramadol?. I agreed to prescribe tramadol, watch for drowsiness, continue with Aleve/Tylenol/Flexeril as needed. RTC around 01-2016 although the patient would like to come later and that is okay.

## 2015-08-22 NOTE — Patient Instructions (Signed)
GO TO THE FRONT DESK Schedule your next appointment for a  Physical 01-2016   For pain: Aleve once a day with food as needed  Tylenol 2 tabs at night as needed  Flexeril at night if needed Tramadol : 1/2 or 1 tab up to twice a day as needed, watch for drowsiness

## 2015-08-24 ENCOUNTER — Other Ambulatory Visit: Payer: Self-pay | Admitting: *Deleted

## 2015-08-24 DIAGNOSIS — C9002 Multiple myeloma in relapse: Secondary | ICD-10-CM

## 2015-08-24 MED ORDER — IXAZOMIB CITRATE 3 MG PO CAPS: ORAL_CAPSULE | ORAL | 6 refills | Status: DC

## 2015-08-24 MED ORDER — POMALYST 3 MG PO CAPS: ORAL_CAPSULE | ORAL | 0 refills | Status: DC

## 2015-09-13 DIAGNOSIS — H2513 Age-related nuclear cataract, bilateral: Secondary | ICD-10-CM | POA: Diagnosis not present

## 2015-09-13 DIAGNOSIS — H00029 Hordeolum internum unspecified eye, unspecified eyelid: Secondary | ICD-10-CM | POA: Diagnosis not present

## 2015-09-13 DIAGNOSIS — H35372 Puckering of macula, left eye: Secondary | ICD-10-CM | POA: Diagnosis not present

## 2015-09-22 ENCOUNTER — Other Ambulatory Visit: Payer: Self-pay | Admitting: Nurse Practitioner

## 2015-09-22 DIAGNOSIS — C9002 Multiple myeloma in relapse: Secondary | ICD-10-CM

## 2015-09-22 MED ORDER — POMALYST 3 MG PO CAPS: ORAL_CAPSULE | ORAL | 0 refills | Status: DC

## 2015-09-22 MED ORDER — IXAZOMIB CITRATE 3 MG PO CAPS: ORAL_CAPSULE | ORAL | 6 refills | Status: DC

## 2015-10-04 ENCOUNTER — Other Ambulatory Visit (HOSPITAL_BASED_OUTPATIENT_CLINIC_OR_DEPARTMENT_OTHER): Payer: PPO

## 2015-10-04 ENCOUNTER — Ambulatory Visit (HOSPITAL_BASED_OUTPATIENT_CLINIC_OR_DEPARTMENT_OTHER): Payer: PPO | Admitting: Hematology & Oncology

## 2015-10-04 ENCOUNTER — Encounter: Payer: Self-pay | Admitting: Hematology & Oncology

## 2015-10-04 VITALS — BP 137/81 | HR 80 | Temp 97.5°F | Resp 16 | Ht 67.0 in | Wt 216.0 lb

## 2015-10-04 DIAGNOSIS — C9002 Multiple myeloma in relapse: Secondary | ICD-10-CM | POA: Diagnosis not present

## 2015-10-04 DIAGNOSIS — C9000 Multiple myeloma not having achieved remission: Secondary | ICD-10-CM | POA: Diagnosis not present

## 2015-10-04 LAB — CMP (CANCER CENTER ONLY)
ALBUMIN: 3.5 g/dL (ref 3.3–5.5)
ALT(SGPT): 34 U/L (ref 10–47)
AST: 24 U/L (ref 11–38)
Alkaline Phosphatase: 52 U/L (ref 26–84)
BUN, Bld: 13 mg/dL (ref 7–22)
CALCIUM: 9.3 mg/dL (ref 8.0–10.3)
CHLORIDE: 101 meq/L (ref 98–108)
CO2: 31 meq/L (ref 18–33)
Creat: 1.2 mg/dl (ref 0.6–1.2)
Glucose, Bld: 101 mg/dL (ref 73–118)
POTASSIUM: 4.5 meq/L (ref 3.3–4.7)
Sodium: 137 mEq/L (ref 128–145)
Total Bilirubin: 0.6 mg/dl (ref 0.20–1.60)
Total Protein: 7.4 g/dL (ref 6.4–8.1)

## 2015-10-04 LAB — CBC WITH DIFFERENTIAL (CANCER CENTER ONLY)
BASO#: 0 10*3/uL (ref 0.0–0.2)
BASO%: 0.7 % (ref 0.0–2.0)
EOS ABS: 0.1 10*3/uL (ref 0.0–0.5)
EOS%: 3.5 % (ref 0.0–7.0)
HEMATOCRIT: 36.2 % — AB (ref 38.7–49.9)
HEMOGLOBIN: 12.4 g/dL — AB (ref 13.0–17.1)
LYMPH#: 1.3 10*3/uL (ref 0.9–3.3)
LYMPH%: 43.3 % (ref 14.0–48.0)
MCH: 34.7 pg — AB (ref 28.0–33.4)
MCHC: 34.3 g/dL (ref 32.0–35.9)
MCV: 101 fL — AB (ref 82–98)
MONO#: 0.6 10*3/uL (ref 0.1–0.9)
MONO%: 21.5 % — AB (ref 0.0–13.0)
NEUT%: 31 % — ABNORMAL LOW (ref 40.0–80.0)
NEUTROS ABS: 0.9 10*3/uL — AB (ref 1.5–6.5)
Platelets: 104 10*3/uL — ABNORMAL LOW (ref 145–400)
RBC: 3.57 10*6/uL — ABNORMAL LOW (ref 4.20–5.70)
RDW: 15.9 % — ABNORMAL HIGH (ref 11.1–15.7)
WBC: 2.9 10*3/uL — ABNORMAL LOW (ref 4.0–10.0)

## 2015-10-04 NOTE — Progress Notes (Addendum)
Hematology and Oncology Follow Up Visit  Gene Hill:[id number] 02-20-1940 75 y.o. 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  Gene Hill is back for followup. He really looks quite good. He has had no problems with the Pomalidomide or Ninlaro. He says he gets constipated with the Ninlaro. He's had no significant fatigue. He is still doing a lot of work outside in his garden. He is very kind and brought me some tomatoes and potatoes today. With all the rain we have had, he has saved a lot of rainwater. Hopefully his garden will be able to be incredibly fruitful.   Thankfully, they have had no further problems getting the Pomalidomide/Ninlaro.   We last saw him inJune his monoclonal spike was stable at 0.7. His IgA level was 1758 mg/dL. his Kappa Lightchain was 4.3 mg/dL.   He's had no bone pain outside of that position with his right knee. He has been getting injections into the right knee. It sounds like he will need surgery on the knee. He sees the orthopedist hopefully in the next week or so. I'll have to coordinate with the with pedis as to when we can do his surgery. I want to make sure that we adjust his schedule of oral medications for the myeloma.  . He's had no mouth sores. He's had no diarrhea. He's had no rashes.   Overall, his performance status is ECOG 1.   Medications:  Current Outpatient Prescriptions:  .  aspirin EC 325 MG tablet, Take 325 mg by mouth daily. , Disp: , Rfl:  .  beta carotene w/minerals (OCUVITE) tablet, Take 1 tablet by mouth daily., Disp: , Rfl:  .  Cholecalciferol (VITAMIN D3) 5000 UNITS TABS, Take 5,000 Units by mouth daily., Disp: , Rfl:  .  cyclobenzaprine (FLEXERIL) 5 MG tablet, Take 1 tablet (5 mg total) by mouth 3  (three) times daily as needed for muscle spasms., Disp: 270 tablet, Rfl: 1 .  ixazomib citrate (NINLARO) 3 MG capsule, Take 1 capsule every 2 weeks on an empty stomach., Disp: 4 capsule, Rfl: 6 .  metoprolol (LOPRESSOR) 100 MG tablet, Take 0.5 tablets (50 mg total) by mouth 2 (two) times daily., Disp: 90 tablet, Rfl: 2 .  niacin 500 MG tablet, Take 500 mg by mouth daily. , Disp: , Rfl:  .  Omega-3 Fatty Acids (FISH OIL) 1200 MG CAPS, Take 1,200 mg by mouth daily., Disp: , Rfl:  .  pantoprazole (PROTONIX) 40 MG tablet, Take 1 tablet (40 mg total) by mouth daily., Disp: 90 tablet, Rfl: 3 .  POMALYST 3 MG capsule, Take 1 capsule by mouth daily. Take with water on days 1-21. Repeat every 28 days. AUTH# [id number], Disp: 21 capsule, Rfl: 0 .  Probiotic Product (PROBIOTIC PO), Take 1 tablet by mouth daily. , Disp: , Rfl:  .  pyridOXINE (VITAMIN B-6) 100 MG tablet, Take 100 mg by mouth daily., Disp: , Rfl:  .  traMADol (ULTRAM) 50 MG tablet, Take 0.5-1 tablets (25-50 mg total) by mouth 2 (two) times daily as needed., Disp: 120 tablet, Rfl: 0  Allergies: No Known Allergies  Past Medical History, Surgical history,  Social history, and Family History were reviewed and updated.  Review of Systems: As above  Physical Exam:  height is 5\' 7"  (1.702 m) and weight is 216 lb 0.6 oz (98 kg). His oral temperature is 97.5 F (36.4 C). His blood pressure is 137/81 and his pulse is 80. His respiration is 16.  Well-developed and well-nourished white gentleman. Head and neck exam shows no ocular or oral lesions. He has no palpable lymph nodes. Lungs are clear. Cardiac exam regular rate and rhythm with no murmurs rubs or bruits. Abdomen is soft. He's mildly obese. He has good bowel sounds. There is no fluid wave. There is no palpable liver or spleen tip. Back exam shows no tenderness over the spine ribs or hips. Extremities shows no clubbing cyanosis or edema. Has good range of motion of his joints. Has good muscle  strength. Skin exam no rashes. Neurological exam is nonfocal.  Lab Results  Component Value Date   WBC 2.9 (L) 10/04/2015   HGB 12.4 (L) 10/04/2015   HCT 36.2 (L) 10/04/2015   MCV 101 (H) 10/04/2015   PLT 104 (L) 10/04/2015     Chemistry      Component Value Date/Time   NA 137 10/04/2015 0904   NA 141 05/13/2015 0818   K 4.5 10/04/2015 0904   K 4.2 05/13/2015 0818   CL 101 10/04/2015 0904   CO2 31 10/04/2015 0904   CO2 24 05/13/2015 0818   BUN 13 10/04/2015 0904   BUN 18.3 05/13/2015 0818   CREATININE 1.2 10/04/2015 0904   CREATININE 0.9 05/13/2015 0818      Component Value Date/Time   CALCIUM 9.3 10/04/2015 0904   CALCIUM 9.1 05/13/2015 0818   ALKPHOS 52 10/04/2015 0904   ALKPHOS 40 05/13/2015 0818   AST 24 10/04/2015 0904   AST 19 05/13/2015 0818   ALT 34 10/04/2015 0904   ALT 26 05/13/2015 0818   BILITOT 0.60 10/04/2015 0904   BILITOT <0.30 05/13/2015 0818       Impression and Plan: [name] is a 75 year old gentleman with history of IgA kappa myeloma. 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 He was treated with standard therapy and got into remission. 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  His relapsed myeloma is certainly high-risk given the chromosomal abnormalities.Marland Kitchen  He is responding to treatment. His myeloma levels have improved. I am just happy that he is tolerating treatment well.  I think that if his myeloma levels are stable, we might be able to keep him on every 2 week Ninlaro. This might help a little bit better for him.  He needs to have surgery on the right knee, I do not see any problems with this. I probably would hold his Pomalidomide for about 2 weeks beforehand. I also would hold the Ninlaro. I think this would be reasonable and would help his immune system and improve his chances of a recovery without infection.  I'm not sure when he will have his knee surgery. He will be talking to his orthopedist  this week.  We will plan to get him back in about 6 weeks.   [name], MD 9/12/201710:14 AM

## 2015-10-05 ENCOUNTER — Encounter: Payer: Self-pay | Admitting: *Deleted

## 2015-10-05 LAB — KAPPA/LAMBDA LIGHT CHAINS
IG LAMBDA FREE LIGHT CHAIN: 18.9 mg/L (ref 5.7–26.3)
Ig Kappa Free Light Chain: 42.9 mg/L — ABNORMAL HIGH (ref 3.3–19.4)
KAPPA/LAMBDA FLC RATIO: 2.27 — AB (ref 0.26–1.65)

## 2015-10-05 LAB — IGG, IGA, IGM
IGM (IMMUNOGLOBIN M), SRM: 17 mg/dL (ref 15–143)
IgG, Qn, Serum: 508 mg/dL — ABNORMAL LOW (ref 700–1600)

## 2015-10-07 LAB — PROTEIN ELECTROPHORESIS, SERUM, WITH REFLEX
A/G RATIO SPE: 1 (ref 0.7–1.7)
ALBUMIN: 3.7 g/dL (ref 2.9–4.4)
ALPHA 1: 0.2 g/dL (ref 0.0–0.4)
Alpha 2: 0.8 g/dL (ref 0.4–1.0)
Beta: 1.1 g/dL (ref 0.7–1.3)
GAMMA GLOBULIN: 1.5 g/dL (ref 0.4–1.8)
Globulin, Total: 3.6 g/dL (ref 2.2–3.9)
INTERPRETATION(SEE BELOW): 0
M-Spike, %: 0.8 g/dL — ABNORMAL HIGH
PDF: 0
Total Protein: 7.3 g/dL (ref 6.0–8.5)

## 2015-10-18 ENCOUNTER — Other Ambulatory Visit: Payer: Self-pay | Admitting: *Deleted

## 2015-10-18 ENCOUNTER — Telehealth: Payer: Self-pay

## 2015-10-18 MED ORDER — POMALYST 3 MG PO CAPS: ORAL_CAPSULE | ORAL | 0 refills | Status: DC

## 2015-10-18 NOTE — Telephone Encounter (Signed)
Ok 120, no rf

## 2015-10-18 NOTE — Telephone Encounter (Signed)
Pt is requesting refill on Tramadol.  Last OV: 08/22/2015 Last Fill: 08/22/2015 #120 and 0RF UDS: None  Please advise.

## 2015-10-19 MED ORDER — TRAMADOL HCL 50 MG PO TABS: 25.0000 mg | ORAL_TABLET | Freq: Two times a day (BID) | ORAL | 0 refills | Status: DC | PRN

## 2015-10-19 NOTE — Telephone Encounter (Signed)
Rx faxed to [facility] pharmacy.  

## 2015-10-19 NOTE — Telephone Encounter (Signed)
Rx printed, awaiting MD signature.  

## 2015-10-20 ENCOUNTER — Telehealth: Payer: Self-pay | Admitting: Internal Medicine

## 2015-10-20 DIAGNOSIS — M25561 Pain in right knee: Secondary | ICD-10-CM | POA: Diagnosis not present

## 2015-10-20 NOTE — Telephone Encounter (Signed)
Pt's spouse says that provider from [facility] will be sending over a form to be completed by PCP to surgical clear the pt. She would just like to make provider aware to look out for form

## 2015-10-20 NOTE — Telephone Encounter (Signed)
Chart reviewed, Pt will need 30 min surgical clearance appt. Last EKG 2012. Please schedule. Thank you.

## 2015-10-21 NOTE — Telephone Encounter (Signed)
Pt has been scheduled.  °

## 2015-10-21 NOTE — Telephone Encounter (Signed)
Received surgical clearance form from [facility]. Appt scheduled for 10/26/2015.

## 2015-10-26 ENCOUNTER — Telehealth: Payer: Self-pay | Admitting: Internal Medicine

## 2015-10-26 ENCOUNTER — Encounter: Payer: Self-pay | Admitting: Internal Medicine

## 2015-10-26 ENCOUNTER — Ambulatory Visit (INDEPENDENT_AMBULATORY_CARE_PROVIDER_SITE_OTHER): Payer: PPO | Admitting: Internal Medicine

## 2015-10-26 VITALS — BP 118/68 | HR 67 | Temp 98.0°F | Resp 14 | Ht 67.0 in | Wt 218.1 lb

## 2015-10-26 DIAGNOSIS — Z79891 Long term (current) use of opiate analgesic: Secondary | ICD-10-CM | POA: Diagnosis not present

## 2015-10-26 DIAGNOSIS — I779 Disorder of arteries and arterioles, unspecified: Secondary | ICD-10-CM | POA: Diagnosis not present

## 2015-10-26 DIAGNOSIS — Z01818 Encounter for other preprocedural examination: Secondary | ICD-10-CM | POA: Diagnosis not present

## 2015-10-26 DIAGNOSIS — I739 Peripheral vascular disease, unspecified: Secondary | ICD-10-CM

## 2015-10-26 NOTE — Telephone Encounter (Signed)
Caller name: Gaetan Relation to pt: self  Call back number: [phone] Pharmacy:  Reason for call: Pt came in office stating that already has appt arranged with Dr. Ocie Doyne [facility] (Cardiologist) and the referral was set up with Dr Curt Bears. Pt would like to know since he already had an appt today 10-26-15 with Dr Larose Kells, pt is needing clearance for surgery sent to the fax # [phone] so that he can proceed with surgery. Please advise.

## 2015-10-26 NOTE — Progress Notes (Signed)
Subjective:    Patient ID: Gene Hill, male    DOB: December 24, 1940, 75 y.o.   MRN: [id number]  DOS:  10/26/2015 Type of visit - description : surgical clearance  Interval history: To have a right knee replacement at his earliest convenience due to DJD that is limiting his ability to go upstairs. The patient reports that although he is able to do otherr ADLs w/ some difficulty , he  is certain he needs to proceed w/  surgery. Previous labs reviewed.  Review of Systems He denies chest pain or difficulty breathing. No lower extremity edema Denies headaches, dizziness. No visual disturbances. No slurred speech, facial numbness or motor deficits   Past Medical History:  Diagnosis Date  . Arthritis   . 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 Blood transfusion   . Carotid bruit    3-11 carotid u/s was  (-)  . 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 GERD (gastroesophageal reflux disease)   . HYPERLIPIDEMIA 05/03/2006  . HYPERTENSION 05/03/2006  . 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 PEPTIC ULCER DISEASE 07/22/2008   per EGD 6-10 .Marland Kitchen ulcer duodenitis     Past Surgical History:  Procedure Laterality Date  . CERVICAL FUSION    . [place]   right  . ROTATOR CUFF REPAIR     right  . SPINAL FUSION      Social History   Social History  . Marital status: Married    Spouse name: N/A  . Number of children: 4  . Years of education: N/A   Occupational History  . retired    Social History Main Topics  . Smoking status: Former Smoker    Packs/day: 0.25    Years: 20.00    Types: Cigarettes    Start date: 04/28/1954    Quit date: 11/28/1974  . Smokeless tobacco: Never Used     Comment: quit 35 years ago, 1976  . Alcohol use 0.0 oz/week     Comment: Rarely   . Drug use: No  . Sexual activity: Not on file   Other Topics Concern  . Not on file    Social History Narrative   Household-- pt, wife, son        Medication List       Accurate as of 10/26/15 11:59 PM. Always use your most recent med list.          aspirin EC 325 MG tablet Take 325 mg by mouth daily.   beta carotene w/minerals tablet Take 1 tablet by mouth daily.   cyclobenzaprine 5 MG tablet Commonly known as:  FLEXERIL Take 1 tablet (5 mg total) by mouth 3 (three) times daily as needed for muscle spasms.   Fish Oil 1200 MG Caps Take 1,200 mg by mouth daily.   ixazomib citrate 3 MG capsule Commonly known as:  NINLARO Take 1 capsule every 2 weeks on an empty stomach.   metoprolol 100 MG tablet Commonly known as:  LOPRESSOR Take 0.5 tablets (50 mg total) by mouth 2 (two) times daily.   niacin 500 MG tablet Take 500 mg by mouth daily.   pantoprazole 40 MG tablet Commonly known as:  PROTONIX Take 1 tablet (40 mg total) by mouth daily.   POMALYST 3 MG capsule Generic drug:  pomalidomide Take 1 capsule by mouth daily. Take with water  on days 1-21. Repeat every 28 days. AUTH# [id number]   PROBIOTIC PO Take 1 tablet by mouth daily.   pyridOXINE 100 MG tablet Commonly known as:  VITAMIN B-6 Take 100 mg by mouth daily.   traMADol 50 MG tablet Commonly known as:  ULTRAM Take 0.5-1 tablets (25-50 mg total) by mouth 2 (two) times daily as needed.   Vitamin D3 5000 units Tabs Take 5,000 Units by mouth daily.          Objective:   Physical Exam BP 118/68 (BP Location: Left Arm, Patient Position: Sitting, Cuff Size: Normal)   Pulse 67   Temp 98 F (36.7 C) (Oral)   Resp 14   Ht _0  (1.702 m)   Wt 218 lb 2 oz (98.9 kg)   SpO2 98%   BMI 34.16 kg/m  General:   Well developed, well nourished . NAD.  HEENT:  Normocephalic . Face symmetric, atraumatic Neck:  No JVD R carotid pulse decrease , L pulse  wnl No bruit  Lungs:  CTA B Normal respiratory effort, no intercostal retractions, no accessory muscle use. Heart: RRR,  no murmur.   no pretibial pitting edema, R calf seems larger than the left, a chronic finding per patient  Abdomen:  Not distended, soft, non-tender. No rebound or rigidity.  Skin: Not pale. Not jaundice Neurologic:  alert & oriented X3.  Speech normal, gait appropriate for age and unassisted Psych--  Cognition and judgment appear intact.  Cooperative with normal attention span and concentration.  Behavior appropriate. No anxious or depressed appearing.    Assessment & Plan:    Assessment Pre-diabetes HTN Hyperlipidemia (self dc  simva 11-2014)  CAD by cath 1998, medical management, Cardiolite ~ 2007 ? Multiple myeloma -- stem cell transplant 2011, with recurrence as off 01-2015  GI: --Barrett' esophagus EGD 2010, 2013 --PUD 2010  MSK -- generalized aches, pains, chronic (no better after dc simvastatin ~ 11-2014) -- DJD Dr Percell Miller Hypogonadism Carotid bruit, [place] (-) 2011  PLAN  Surgical clearance: 75 year old gentleman with prediabetes, HTN, hyperlipidemia, CAD by remote cardiac catheterization on medical management needs a knee replacement. Last objective eval of his heart was a Cardiolite in 2007. I couldn't find the report. He is asx from the cardiovascular standpoint however he has a number of risk factors. He is reluctant to proceed with a presurgical stress test consequently I will ask cardiology to eval. He has been cleared by hematology Decreased R carotid pulse: check a [place] DJD: On Ultram,  UDS and contract

## 2015-10-26 NOTE — Telephone Encounter (Signed)
Referral placed. Awaiting labs, and carotid US for clearance.

## 2015-10-26 NOTE — Patient Instructions (Signed)
Will get a carotid test  Will refer you to cardiology

## 2015-10-26 NOTE — Progress Notes (Signed)
Pre visit review using our clinic review tool, if applicable. No additional management support is needed unless otherwise documented below in the visit note. 

## 2015-10-27 NOTE — Assessment & Plan Note (Signed)
Surgical clearance: 75 year old gentleman with prediabetes, HTN, hyperlipidemia, CAD by remote cardiac catheterization on medical management needs a knee replacement. Last objective eval of his heart was a Cardiolite in 2007. I couldn't find the report. He is asx from the cardiovascular standpoint however he has a number of risk factors. He is reluctant to proceed with a presurgical stress test consequently I will ask cardiology to eval. He has been cleared by hematology Decreased R carotid pulse: check a [place] DJD: On Ultram,  UDS and contract

## 2015-10-31 ENCOUNTER — Encounter: Payer: Self-pay | Admitting: Cardiology

## 2015-10-31 ENCOUNTER — Ambulatory Visit (INDEPENDENT_AMBULATORY_CARE_PROVIDER_SITE_OTHER): Payer: PPO | Admitting: Cardiology

## 2015-10-31 VITALS — BP 130/90 | HR 78 | Ht 67.0 in | Wt 216.4 lb

## 2015-10-31 DIAGNOSIS — Z01810 Encounter for preprocedural cardiovascular examination: Secondary | ICD-10-CM

## 2015-10-31 NOTE — Patient Instructions (Signed)
Medication Instructions:  Your physician recommends that you continue on your current medications as directed. Please refer to the Current Medication list given to you today.  Lab work: None ordered  Testing/Procedures: None ordered  Follow-Up: No follow up is needed at this time with Dr. Camnitz.  He will see you on an as needed basis.   Thank you for choosing [facility]!!   Dawn Kiper, RN [phone]     

## 2015-10-31 NOTE — Progress Notes (Signed)
Electrophysiology Office Note   Date:  10/31/2015   ID:  FRANCISZEK Hill, DOB 14-Mar-1940, MRN [id number]  PCP:  [name], MD  Cardiologist:  Chrystian Cupples Meredith Leeds, MD    Chief Complaint  Patient presents with  . New Patient (Initial Visit)    Cardiac Clearance     History of Present Illness: Gene Hill is a 75 y.o. male who presents today for electrophysiology evaluation.   Plan for right knee replacement due to DJD.  Able to go through with all of his ADLs. He has no chest pain or shortness of breath. He says that he does not have to sleep with head of his bed elevated. He is able to ambulate on flat ground without issue of chest pain or shortness of breath, only limited by knee pain. This past weekend, he has been working in the yard without having any issues.   Today, he denies symptoms of palpitations, chest pain, shortness of breath, orthopnea, PND, lower extremity edema, claudication, dizziness, presyncope, syncope, bleeding, or neurologic sequela. The patient is tolerating medications without difficulties and is otherwise without complaint today.    Past Medical History:  Diagnosis Date  . Arthritis   . 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 Blood transfusion   . Carotid bruit    3-11 carotid u/s was  (-)  . 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 GERD (gastroesophageal reflux disease)   . HYPERLIPIDEMIA 05/03/2006  . HYPERTENSION 05/03/2006  . 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 PEPTIC ULCER DISEASE 07/22/2008   per EGD 6-10 .Marland Kitchen ulcer duodenitis    Past Surgical History:  Procedure Laterality Date  . CERVICAL FUSION    . [place]   right  . ROTATOR CUFF REPAIR     right  . SPINAL FUSION       Current Outpatient Prescriptions  Medication Sig Dispense Refill  . aspirin EC 325 MG tablet Take 325 mg by mouth daily.     .  Cholecalciferol (VITAMIN D3) 5000 UNITS TABS Take 5,000 Units by mouth daily.    . cyclobenzaprine (FLEXERIL) 5 MG tablet Take 1 tablet (5 mg total) by mouth 3 (three) times daily as needed for muscle spasms. 270 tablet 1  . ixazomib citrate (NINLARO) 3 MG capsule Take 1 capsule every 2 weeks on an empty stomach. 4 capsule 6  . metoprolol (LOPRESSOR) 100 MG tablet Take 0.5 tablets (50 mg total) by mouth 2 (two) times daily. 90 tablet 2  . niacin 500 MG tablet Take 500 mg by mouth daily.     . Omega-3 Fatty Acids (FISH OIL) 1200 MG CAPS Take 1,200 mg by mouth daily.    . pantoprazole (PROTONIX) 40 MG tablet Take 1 tablet (40 mg total) by mouth daily. 90 tablet 3  . POMALYST 3 MG capsule Take 1 capsule by mouth daily. Take with water on days 1-21. Repeat every 28 days. AUTH# [id number] 21 capsule 0  . Probiotic Product (PROBIOTIC PO) Take 1 tablet by mouth daily.     Marland Kitchen pyridOXINE (VITAMIN B-6) 100 MG tablet Take 100 mg by mouth daily.    . traMADol (ULTRAM) 50 MG tablet Take 0.5-1 tablets (25-50 mg total) by mouth 2 (two) times daily as needed. 120 tablet 0   No current facility-administered medications for this visit.  Allergies:   Review of patient's allergies indicates no known allergies.   Social History:  The patient  reports that he quit smoking about 40 years ago. His smoking use included Cigarettes. He started smoking about 61 years ago. He has a 5.00 pack-year smoking history. He has never used smokeless tobacco. He reports that he drinks alcohol. He reports that he does not use drugs.   Family History:  The patient's family history includes Cerebral palsy in his son; Heart attack in his maternal grandfather; Kidney cancer in his father; Leukemia in his mother; Pulmonary embolism in his brother; Stroke in his maternal grandmother and paternal grandmother.    ROS:  Please see the history of present illness.   Otherwise, review of systems is positive for joint swelling, balance problems.    All other systems are reviewed and negative.    PHYSICAL EXAM: VS:  BP 130/90   Pulse 78   Ht 5' 7" (1.702 m)   Wt 216 lb 6.4 oz (98.2 kg)   BMI 33.89 kg/m  , BMI Body mass index is 33.89 kg/m. GEN: Well nourished, well developed, in no acute distress  HEENT: normal  Neck: no JVD, carotid bruits, or masses Cardiac: RRR; no murmurs, rubs, or gallops,no edema  Respiratory:  clear to auscultation bilaterally, normal work of breathing GI: soft, nontender, nondistended, + BS MS: no deformity or atrophy  Skin: warm and dry Neuro:  Strength and sensation are intact Psych: euthymic mood, full affect  EKG:  EKG is not ordered today. Personal review of the ekg ordered 10/26/15 shows sinus rhythm, rate 63  Recent Labs: 10/04/2015: ALT(SGPT) 34; BUN, Bld 13; Creat 1.2; HGB 12.4; Platelets 104; Potassium 4.5; Sodium 137    Lipid Panel     Component Value Date/Time   CHOL 160 02/07/2015 0855   TRIG 133.0 02/07/2015 0855   TRIG 129 01/28/2006 0900   HDL 42.60 02/07/2015 0855   CHOLHDL 4 02/07/2015 0855   VLDL 26.6 02/07/2015 0855   LDLCALC 90 02/07/2015 0855   LDLDIRECT 74.1 08/27/2012 0931     Wt Readings from Last 3 Encounters:  10/31/15 216 lb 6.4 oz (98.2 kg)  10/26/15 218 lb 2 oz (98.9 kg)  10/04/15 216 lb 0.6 oz (98 kg)      Other studies Reviewed: Additional studies/ records that were reviewed today include: PCP notes   ASSESSMENT AND PLAN:  1.  Hypertension: Well controlled today  2. CAD s/p cath 2007: Currently on an aspirin without symptoms.  3. Presurgical evaluation: Currently has no chest pain, shortness of breath, PND, orthopnea he is scheduled for a right knee replacement. He is at an intermediate risk for an intermediate risk procedure. If possible, would continue his aspirin and a beta blocker throughout the procedural time. He does not have any chest pain or shortness of breath, and therefore further evaluation is not necessary.    Current medicines  are reviewed at length with the patient today.   The patient does not have concerns regarding his medicines.  The following changes were made today:  none  Labs/ tests ordered today include:  No orders of the defined types were placed in this encounter.    Disposition:   FU with Jodelle Fausto PRN  Signed, Shavona Gunderman Meredith Leeds, MD  10/31/2015 10:23 AM     [address] [place] [address] [phone] (office) [phone] (fax)

## 2015-11-01 ENCOUNTER — Encounter: Payer: Self-pay | Admitting: Nurse Practitioner

## 2015-11-01 NOTE — Progress Notes (Signed)
Patient's wife called and stated they had not received their medication (Pomalyst) for the month of October. She thought maybe we had cancelled due to his knee surgery and wanted to let us know that his surgery is not scheduled until December 1st now. Informed her that he would continue on his medication for October and November and will be given specific instructions when he came in to see Dr. Marin Olp the beginning of November as to when to not take the medication for November. She verbalized understanding. Informed the husband that the RX was indeed processed and sent to [facility] on 9/26 and was not cancelled. Stated they should contact [facility] to see if the medication was on the way and wanted to make sure they had not told the rep not to send. Mr.Lablanc stated his wife left the house and would return shortly and would have her to follow-up with the delivery status. Instructed to contact office with any other concerns or delays. He verbalized understanding.

## 2015-11-07 ENCOUNTER — Telehealth: Payer: Self-pay

## 2015-11-07 NOTE — Telephone Encounter (Signed)
UDS: 10/26/2015  Positive for Tramadol  Low risk per Dr. Larose Kells 11/07/2015

## 2015-11-18 ENCOUNTER — Observation Stay (HOSPITAL_COMMUNITY)
Admission: EM | Admit: 2015-11-18 | Discharge: 2015-11-20 | Disposition: A | Discharge status: Home / Self Care | Payer: PPO | Attending: Interventional Cardiology | Admitting: Interventional Cardiology

## 2015-11-18 ENCOUNTER — Emergency Department (HOSPITAL_COMMUNITY): Payer: PPO

## 2015-11-18 ENCOUNTER — Encounter (HOSPITAL_COMMUNITY): Payer: Self-pay | Admitting: Emergency Medicine

## 2015-11-18 DIAGNOSIS — R079 Chest pain, unspecified: Secondary | ICD-10-CM | POA: Diagnosis not present

## 2015-11-18 DIAGNOSIS — M546 Pain in thoracic spine: Secondary | ICD-10-CM | POA: Diagnosis not present

## 2015-11-18 DIAGNOSIS — Z7982 Long term (current) use of aspirin: Secondary | ICD-10-CM | POA: Diagnosis not present

## 2015-11-18 DIAGNOSIS — K2270 Barrett's esophagus without dysplasia: Secondary | ICD-10-CM | POA: Diagnosis present

## 2015-11-18 DIAGNOSIS — E785 Hyperlipidemia, unspecified: Secondary | ICD-10-CM | POA: Diagnosis present

## 2015-11-18 DIAGNOSIS — M549 Dorsalgia, unspecified: Secondary | ICD-10-CM | POA: Diagnosis present

## 2015-11-18 DIAGNOSIS — Z79899 Other long term (current) drug therapy: Secondary | ICD-10-CM | POA: Diagnosis not present

## 2015-11-18 DIAGNOSIS — R072 Precordial pain: Principal | ICD-10-CM | POA: Insufficient documentation

## 2015-11-18 DIAGNOSIS — I10 Essential (primary) hypertension: Secondary | ICD-10-CM | POA: Diagnosis not present

## 2015-11-18 DIAGNOSIS — I2510 Atherosclerotic heart disease of native coronary artery without angina pectoris: Secondary | ICD-10-CM | POA: Diagnosis not present

## 2015-11-18 DIAGNOSIS — R0789 Other chest pain: Secondary | ICD-10-CM | POA: Diagnosis not present

## 2015-11-18 DIAGNOSIS — C9000 Multiple myeloma not having achieved remission: Secondary | ICD-10-CM | POA: Diagnosis present

## 2015-11-18 DIAGNOSIS — Z87891 Personal history of nicotine dependence: Secondary | ICD-10-CM | POA: Insufficient documentation

## 2015-11-18 LAB — COMPREHENSIVE METABOLIC PANEL
ALBUMIN: 3.6 g/dL (ref 3.5–5.0)
ALK PHOS: 41 U/L (ref 38–126)
ALT: 26 U/L (ref 17–63)
ANION GAP: 10 (ref 5–15)
AST: 28 U/L (ref 15–41)
BILIRUBIN TOTAL: 0.7 mg/dL (ref 0.3–1.2)
BUN: 12 mg/dL (ref 6–20)
CALCIUM: 9.4 mg/dL (ref 8.9–10.3)
CO2: 25 mmol/L (ref 22–32)
Chloride: 102 mmol/L (ref 101–111)
Creatinine, Ser: 0.91 mg/dL (ref 0.61–1.24)
GFR calc Af Amer: >60 mL/min (ref >60)
GFR calc non Af Amer: >60 mL/min (ref >60)
GLUCOSE: 159 mg/dL — AB (ref 65–99)
Potassium: 3.8 mmol/L (ref 3.5–5.1)
SODIUM: 137 mmol/L (ref 135–145)
Total Protein: 7.3 g/dL (ref 6.5–8.1)

## 2015-11-18 LAB — CBC
HEMATOCRIT: 38.5 % — AB (ref 39.0–52.0)
HEMOGLOBIN: 12.4 g/dL — AB (ref 13.0–17.0)
MCH: 33.1 pg (ref 26.0–34.0)
MCHC: 32.2 g/dL (ref 30.0–36.0)
MCV: 102.7 fL — ABNORMAL HIGH (ref 78.0–100.0)
Platelets: 150 10*3/uL (ref 150–400)
RBC: 3.75 MIL/uL — AB (ref 4.22–5.81)
RDW: 16.2 % — ABNORMAL HIGH (ref 11.5–15.5)
WBC: 4.2 10*3/uL (ref 4.0–10.5)

## 2015-11-18 LAB — HEPARIN LEVEL (UNFRACTIONATED): Heparin Unfractionated: 0.11 IU/mL — ABNORMAL LOW (ref 0.30–0.70)

## 2015-11-18 LAB — I-STAT TROPONIN, ED: Troponin i, poc: 0 ng/mL (ref 0.00–0.08)

## 2015-11-18 LAB — TROPONIN I: Troponin I: <0.03 ng/mL (ref <0.03)

## 2015-11-18 MED ORDER — GI COCKTAIL ~~LOC~~
30.0000 mL | Freq: Once | ORAL | Status: AC
  Administered 2015-11-18: 30 mL via ORAL
  Filled 2015-11-18: qty 30

## 2015-11-18 MED ORDER — HEPARIN (PORCINE) IN NACL 100-0.45 UNIT/ML-% IJ SOLN
1800.0000 [IU]/h | INTRAMUSCULAR | Status: DC
  Administered 2015-11-18: 1200 [IU]/h via INTRAVENOUS
  Administered 2015-11-19: 1800 [IU]/h via INTRAVENOUS
  Filled 2015-11-18 (×3): qty 250

## 2015-11-18 MED ORDER — FENTANYL CITRATE (PF) 100 MCG/2ML IJ SOLN
50.0000 ug | Freq: Once | INTRAMUSCULAR | Status: AC
  Administered 2015-11-18: 50 ug via INTRAVENOUS
  Filled 2015-11-18: qty 2

## 2015-11-18 MED ORDER — TRAMADOL HCL 50 MG PO TABS
50.0000 mg | ORAL_TABLET | Freq: Two times a day (BID) | ORAL | Status: DC | PRN
  Administered 2015-11-18: 50 mg via ORAL
  Filled 2015-11-18: qty 1

## 2015-11-18 MED ORDER — RISAQUAD PO CAPS
ORAL_CAPSULE | Freq: Every day | ORAL | Status: DC
  Administered 2015-11-19 – 2015-11-20 (×2): 1 via ORAL
  Filled 2015-11-18 (×2): qty 1

## 2015-11-18 MED ORDER — VITAMIN D 1000 UNITS PO TABS
5000.0000 [IU] | ORAL_TABLET | Freq: Every day | ORAL | Status: DC
  Administered 2015-11-19 – 2015-11-20 (×2): 5000 [IU] via ORAL
  Filled 2015-11-18 (×3): qty 5

## 2015-11-18 MED ORDER — NIACIN 500 MG PO TABS
500.0000 mg | ORAL_TABLET | Freq: Every day | ORAL | Status: DC
  Administered 2015-11-19 – 2015-11-20 (×2): 500 mg via ORAL
  Filled 2015-11-18 (×2): qty 1

## 2015-11-18 MED ORDER — SODIUM CHLORIDE 0.9 % IV BOLUS (SEPSIS)
1000.0000 mL | Freq: Once | INTRAVENOUS | Status: AC
  Administered 2015-11-18: 1000 mL via INTRAVENOUS

## 2015-11-18 MED ORDER — PANTOPRAZOLE SODIUM 40 MG PO TBEC
40.0000 mg | DELAYED_RELEASE_TABLET | Freq: Every day | ORAL | Status: DC
  Administered 2015-11-19 – 2015-11-20 (×2): 40 mg via ORAL
  Filled 2015-11-18 (×2): qty 1

## 2015-11-18 MED ORDER — ACETAMINOPHEN 325 MG PO TABS: 650.0000 mg | ORAL_TABLET | ORAL | Status: DC | PRN

## 2015-11-18 MED ORDER — HEPARIN BOLUS VIA INFUSION
4000.0000 [IU] | Freq: Once | INTRAVENOUS | Status: AC
  Administered 2015-11-18: 4000 [IU] via INTRAVENOUS
  Filled 2015-11-18: qty 4000

## 2015-11-18 MED ORDER — NITROGLYCERIN 0.4 MG SL SUBL
0.4000 mg | SUBLINGUAL_TABLET | SUBLINGUAL | Status: DC | PRN
  Administered 2015-11-18 (×2): 0.4 mg via SUBLINGUAL
  Filled 2015-11-18: qty 1

## 2015-11-18 MED ORDER — IOPAMIDOL (ISOVUE-370) INJECTION 76%
INTRAVENOUS | Status: AC
  Administered 2015-11-18: 70 mL
  Filled 2015-11-18: qty 100

## 2015-11-18 MED ORDER — METOPROLOL TARTRATE 50 MG PO TABS
50.0000 mg | ORAL_TABLET | Freq: Two times a day (BID) | ORAL | Status: DC
  Administered 2015-11-18 – 2015-11-20 (×4): 50 mg via ORAL
  Filled 2015-11-18 (×4): qty 1

## 2015-11-18 MED ORDER — NITROGLYCERIN IN D5W 200-5 MCG/ML-% IV SOLN
0.0000 ug/min | Freq: Once | INTRAVENOUS | Status: AC
  Administered 2015-11-18: 5 ug/min via INTRAVENOUS
  Filled 2015-11-18: qty 250

## 2015-11-18 MED ORDER — ASPIRIN EC 325 MG PO TBEC
325.0000 mg | DELAYED_RELEASE_TABLET | Freq: Every day | ORAL | Status: DC
  Administered 2015-11-19 – 2015-11-20 (×2): 325 mg via ORAL
  Filled 2015-11-18 (×2): qty 1

## 2015-11-18 MED ORDER — OMEGA-3-ACID ETHYL ESTERS 1 G PO CAPS
1.0000 g | ORAL_CAPSULE | Freq: Every day | ORAL | Status: DC
  Administered 2015-11-19 – 2015-11-20 (×2): 1 g via ORAL
  Filled 2015-11-18 (×2): qty 1

## 2015-11-18 MED ORDER — ONDANSETRON HCL 4 MG/2ML IJ SOLN: 4.0000 mg | Freq: Four times a day (QID) | INTRAMUSCULAR | Status: DC | PRN

## 2015-11-18 MED ORDER — ADULT MULTIVITAMIN W/MINERALS CH
1.0000 | ORAL_TABLET | Freq: Every day | ORAL | Status: DC
  Administered 2015-11-19 – 2015-11-20 (×2): 1 via ORAL
  Filled 2015-11-18 (×2): qty 1

## 2015-11-18 MED ORDER — SODIUM CHLORIDE 0.9 % IV SOLN
INTRAVENOUS | Status: DC
  Administered 2015-11-18 – 2015-11-19 (×2): via INTRAVENOUS

## 2015-11-18 NOTE — ED Provider Notes (Addendum)
[facility] DEPT Provider Note   CSN: [id number] Arrival date & time: 11/18/15  1145     History   Chief Complaint Chief Complaint  Patient presents with  . Chest Pain    HPI Gene Hill is a 75 y.o. male.   Chest Pain   This is a new problem. The current episode started 1 to 2 hours ago. The problem occurs constantly. The problem has been gradually worsening. The pain is associated with exertion. The pain is present in the substernal region. The pain is at a severity of 4/10. The pain is moderate. The quality of the pain is described as dull and heavy. The pain does not radiate. Associated symptoms include back pain and shortness of breath. Pertinent negatives include no abdominal pain, no cough and no diaphoresis. He has tried nitroglycerin for the symptoms. The treatment provided moderate relief. Risk factors include being elderly, male gender, obesity and smoking/tobacco exposure.  His past medical history is significant for CAD, hyperlipidemia, hypertension and MI.    Past Medical History:  Diagnosis Date  . Arthritis   . 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 Blood transfusion   . Carotid bruit    3-11 carotid u/s was  (-)  . 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 GERD (gastroesophageal reflux disease)   . HYPERLIPIDEMIA 05/03/2006  . HYPERTENSION 05/03/2006  . 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 PEPTIC ULCER DISEASE 07/22/2008   per EGD 6-10 .Marland Kitchen ulcer duodenitis     Patient Active Problem List   Diagnosis Date Noted  . PCP NOTES >>>>>>>>>>>>>>>>>>>>>>>> 08/22/2015  . Arthritis-- aches, pain 02/05/2014  . Dizziness 02/05/2014  . 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 Annual physical exam 08/14/2010  . 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 Multiple myeloma ([place]) 04/15/2009  .  CAROTID BRUIT 04/14/2009  . 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 DIVERTICULOSIS, COLON 08/13/2008  . Backache 07/22/2006  . FATIGUE 07/22/2006  . Hyperlipidemia 05/03/2006  . Essential hypertension 05/03/2006  . Coronary atherosclerosis 05/03/2006  . COLONOSCOPY WITH BIOPSY, HX OF 09/22/2002    Past Surgical History:  Procedure Laterality Date  . CERVICAL FUSION    . [place]   right  . ROTATOR CUFF REPAIR     right  . SPINAL FUSION         Home Medications    Prior to Admission medications   Medication Sig Start Date End Date Taking? Authorizing Provider  aspirin EC 325 MG tablet Take 325 mg by mouth daily.    Yes Historical Provider, MD  Cholecalciferol (VITAMIN D3) 5000 UNITS TABS Take 5,000 Units by mouth daily.   Yes Historical Provider, MD  cyclobenzaprine (FLEXERIL) 5 MG tablet Take 5 mg by mouth 3 (three) times daily as needed for muscle spasms.   Yes Historical Provider, MD  ixazomib citrate (NINLARO) 3 MG capsule Take 1 capsule every 2 weeks on an empty stomach. 09/22/15  Yes [name], MD  metoprolol (LOPRESSOR) 100 MG tablet Take 0.5 tablets (50 mg total) by mouth 2 (two) times daily. 03/02/15  Yes Colon Branch, MD  Multiple Vitamin (MULTIVITAMIN) tablet Take 1 tablet by mouth daily.   Yes Historical Provider, MD  niacin 500 MG tablet Take 500 mg by mouth daily.    Yes Historical Provider, MD  Omega-3 Fatty Acids (FISH OIL) 1200 MG CAPS Take 1,200 mg by mouth daily.   Yes Historical Provider, MD  pantoprazole (PROTONIX) 40 MG tablet Take 1 tablet (40 mg total) by mouth daily. 04/18/15  Yes Colon Branch, MD  POMALYST 3 MG capsule Take 1 capsule by mouth daily. Take with water on days 1-21. Repeat every 28 days. AUTH# [id number] 10/18/15  Yes [name], MD  Probiotic Product (PROBIOTIC PO) Take 1 tablet by mouth daily.    Yes Historical Provider, MD  traMADol (ULTRAM) 50 MG tablet Take 0.5-1 tablets (25-50 mg total) by mouth 2 (two) times daily as needed.  10/19/15  Yes Colon Branch, MD    Family History Family History  Problem Relation Age of Onset  . Kidney cancer Father   . Leukemia Mother   . Pulmonary embolism Brother   . Stroke Maternal Grandmother   . Heart attack Maternal Grandfather   . Stroke Paternal Grandmother   . Cerebral palsy Son   . Prostate cancer Neg Hx   . Colon cancer Neg Hx   . Esophageal cancer Neg Hx   . Rectal cancer Neg Hx   . Stomach cancer Neg Hx     Social History Social History  Substance Use Topics  . Smoking status: Former Smoker    Packs/day: 0.25    Years: 20.00    Types: Cigarettes    Start date: 04/28/1954    Quit date: 11/28/1974  . Smokeless tobacco: Never Used     Comment: quit 35 years ago, 1976  . Alcohol use 0.0 oz/week     Comment: Rarely      Allergies   Review of patient's allergies indicates no known allergies.   Review of Systems Review of Systems  Constitutional: Negative for diaphoresis.  Respiratory: Positive for shortness of breath. Negative for cough.   Cardiovascular: Positive for chest pain.  Gastrointestinal: Negative for abdominal pain.  Musculoskeletal: Positive for back pain.  All other systems reviewed and are negative.    Physical Exam Updated Vital Signs BP 116/81   Pulse 97   Resp 14   Ht 5' 7"  (1.702 m)   Wt 215 lb (97.5 kg)   SpO2 96%   BMI 33.67 kg/m   Physical Exam  Constitutional: He is oriented to person, place, and time. He appears well-developed and well-nourished.  Clutching chest  HENT:  Head: Normocephalic and atraumatic.  Eyes: Conjunctivae and EOM are normal.  Neck: Neck supple.  Cardiovascular: Normal rate and regular rhythm.   No murmur heard. Equal radial pulses  Pulmonary/Chest: Effort normal and breath sounds normal. No respiratory distress.  Abdominal: Soft. There is no tenderness.  Musculoskeletal: He exhibits no edema or deformity.  Neurological: He is alert and oriented to person, place, and time.  Skin: Skin is warm  and dry.  Psychiatric: He has a normal mood and affect.  Nursing note and vitals reviewed.    ED Treatments / Results  Labs (all labs ordered are listed, but only abnormal results are displayed) Labs Reviewed  CBC - Abnormal; Notable for the following:       Result Value   RBC 3.75 (*)    Hemoglobin 12.4 (*)    HCT 38.5 (*)    MCV 102.7 (*)    RDW 16.2 (*)    All other components within normal limits  COMPREHENSIVE METABOLIC PANEL - Abnormal; Notable for the following:    Glucose, Bld 159 (*)    All  other components within normal limits  TROPONIN I  HEPARIN LEVEL (UNFRACTIONATED)  HEPARIN LEVEL (UNFRACTIONATED)  CBC  I-STAT TROPOININ, ED    EKG  EKG Interpretation  Date/Time:  Friday November 18 2015 14:20:19 EDT Ventricular Rate:  89 PR Interval:    QRS Duration: 76 QT Interval:  338 QTC Calculation: 412 R Axis:   40 Text Interpretation:  Sinus rhythm Abnormal R-wave progression, early transition improved ST depressions Confirmed by [facility] MD, Corene Cornea [phone]) on 11/18/2015 4:50:20 PM       Radiology Dg Chest 2 View  Result Date: 11/18/2015 CLINICAL DATA:  Sharp chest pain.  Numbness. EXAM: CHEST  2 VIEW COMPARISON:  01/26/2010 FINDINGS: The heart size and mediastinal contours are within normal limits. Both lungs are clear. No acute osseous abnormality. Attenuation of the distal right clavicle likely secondary to prior subacromial decompression/clavicular section. IMPRESSION: No active cardiopulmonary disease. Electronically Signed   By: Kathreen Devoid   On: 11/18/2015 12:35    Procedures Procedures (including critical care time)  CRITICAL CARE Performed by: Merrily Pew Total critical care time: 35 minutes Critical care time was exclusive of separately billable procedures and treating other patients. Critical care was necessary to treat or prevent imminent or life-threatening deterioration. Critical care was time spent personally by me on the following  activities: development of treatment plan with patient and/or surrogate as well as nursing, discussions with consultants, evaluation of patient's response to treatment, examination of patient, obtaining history from patient or surrogate, ordering and performing treatments and interventions, ordering and review of laboratory studies, ordering and review of radiographic studies, pulse oximetry and re-evaluation of patient's condition.   Medications Ordered in ED Medications  nitroGLYCERIN (NITROSTAT) SL tablet 0.4 mg (0.4 mg Sublingual Given 11/18/15 1331)  sodium chloride 0.9 % bolus 1,000 mL (0 mLs Intravenous Stopped 11/18/15 1308)    And  0.9 %  sodium chloride infusion ( Intravenous New Bag/Given 11/18/15 1549)  heparin ADULT infusion 100 units/mL (25000 units/231m sodium chloride 0.45%) (1,200 Units/hr Intravenous New Bag/Given 11/18/15 1348)  nitroGLYCERIN 50 mg in dextrose 5 % 250 mL (0.2 mg/mL) infusion (30 mcg/min Intravenous Rate/Dose Change 11/18/15 1515)  heparin bolus via infusion 4,000 Units (4,000 Units Intravenous Bolus from Bag 11/18/15 1348)  gi cocktail (Maalox,Lidocaine,Donnatal) (30 mLs Oral Given 11/18/15 1319)  fentaNYL (SUBLIMAZE) injection 50 mcg (50 mcg Intravenous Given 11/18/15 1319)  fentaNYL (SUBLIMAZE) injection 50 mcg (50 mcg Intravenous Given 11/18/15 1544)     Initial Impression / Assessment and Plan / ED Course  I have reviewed the triage vital signs and the nursing notes.  Pertinent labs & imaging results that were available during my care of the patient were reviewed by me and considered in my medical decision making (see chart for details).  Clinical Course   ECG w/ st depressions, concern for NSTEMI. Will await BP's and CXR and start heparin with cardiology consult.  Equal bilateral pulses, no widening on CXR, doubt dissection at this time. Heparin started. Cardiology consult placed Over 2 hours since cardiology consulted and still no evaluation. Will  re-consult. Plan for cardiology admission.   Final Clinical Impressions(s) / ED Diagnoses   Final diagnoses:  Chest pain, unspecified type    New Prescriptions New Prescriptions   No medications on file     JMerrily Pew MD 11/18/15 1[place] MD 11/18/15 1717

## 2015-11-18 NOTE — Progress Notes (Signed)
ANTICOAGULATION CONSULT NOTE - Initial Consult  Pharmacy Consult for heparin Indication: chest pain/ACS  No Known Allergies  Patient Measurements: Height: 5' 7"  (170.2 cm) Weight: 215 lb (97.5 kg) IBW/kg (Calculated) : 66.1 Heparin Dosing Weight: 87 kg  Vital Signs: BP: 125/103 (10/27 1331) Pulse Rate: 103 (10/27 1331)  Labs:  Recent Labs  11/18/15 1153  HGB 12.4*  HCT 38.5*  PLT 150  CREATININE 0.91  TROPONINI <0.03    Estimated Creatinine Clearance: 75.7 mL/min (by C-G formula based on SCr of 0.91 mg/dL). Clearance stable  Medical History: Past Medical History:  Diagnosis Date  . Arthritis   . 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 Blood transfusion   . Carotid bruit    3-11 carotid u/s was  (-)  . 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 GERD (gastroesophageal reflux disease)   . HYPERLIPIDEMIA 05/03/2006  . HYPERTENSION 05/03/2006  . 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 PEPTIC ULCER DISEASE 07/22/2008   per EGD 6-10 .Marland Kitchen ulcer duodenitis     Medications:   (Not in a hospital admission)  Assessment: Pt is not on any PTA anticoagulants. Low H/H 12.4/38.5, Plt 150. No bleeding reported.   Goal of Therapy:  Heparin level 0.3-0.7 units/ml Monitor platelets by anticoagulation protocol: Yes   Plan: Heparin 4000 units IV bolus x1 then 1200 units/hr Heparin level @ 2200 Daily CBC and heparin level Monitor for s/sx bleeding   Cheral Almas, PharmD Candidate 11/18/2015,1:41 PM

## 2015-11-18 NOTE — H&P (Signed)
The patient has been seen in conjunction with Vin Bhagat, PAC. All aspects of care have been considered and discussed. The patient has been personally interviewed, examined, and all clinical data has been reviewed.   75 year old with previously documented nonobstructive coronary disease by coronary angiography greater than 20 years ago. Has a history of hypertension and multiple myeloma with extensive bony metastases.  Left chest discomfort began shortly after awakening this morning. The discomfort seems to come and go in waves. It has not completely resolved since starting 9 hours ago. Onset was around 8 AM. Movements such as lifting his chin and looking upward causes the discomfort to intensify. Certain positions also intensify the discomfort. It is not pleuritic. He denies dyspnea. No improvement with IV nitroglycerin.  ECG 3 since admission did not demonstrate any significant ischemic changes. Troponin POC and troponin I (lab) are normal  Chest x-ray does not reveal wide mediastinum, infiltrate, effusion, or other abnormality. Reviewed all bone scans which revealed multiple bony lesions throughout the spine.  Chest pain presentation, of uncertain etiology. So far, no evidence of acute ischemia on ECG or evidence of injury by markers. CT of the chest will be performed to rule out pulmonary embolism and widened aorta. An echocardiogram will be done to interrogate the pericardial space. We ordered a myocardial perfusion study for this weekend depending on data base. Overall my suspicion is that this may be musculoskeletal/neuropathic discomfort related to the multiple myeloma. He will be admitted to a stepdown bed for further management   Patient ID: Gene Hill MRN: [id number], DOB/AGE: 1941/01/01   Admit date: 11/18/2015  Primary Physician: [name], MD Primary Cardiologist: Dr. Curt Bears  Pt. Profile:  Gene Hill is a 75 y.o. male with a known history of CAD, hypertension,  hyperlipidemia, multiple myeloma (followed by Dr. Arelia Sneddon) presented for evaluation of chest pain.  HPI: As above. She does have a remote history of cath in 1998 by Dr. Rex Kras, managed medically with aspirin 363m and beta blocker. Negative Cardiolite in 03/2005. No reports available for review. He was recently seen by Dr. 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 He was cleared for surgery without any intervention as he was asymptomatic.  This morning around 8:30 AM after he woke up he had sudden onset of left upper chest pressure and clamminess. This lasted for 3-4 hours with 8-9/10 severity and EMS was called. He was given sublingual nitroglycerin x 3 with minimal improvement and again his pain exacerbated. His EKG shows sinus rhythm at rate of 101 bpm with questionable ST segment depression inferior lead. Point-of-care troponin negative. Troponin I negative. Electrolytes normal. Chest x-ray clear. He was started on IV heparin and IV nitroglycerin for symptoms concerning of unstable angina by ED provider. During my conversation patient states that he constantly have 9 out of 10 left upper chest pain and occasional radiation to upper back and shoulder. Mild shortness of breath. He denies nausea, vomiting, lower extremity edema, orthopnea, PND, syncope, palpitation, melena, blood in his stool or urine.   Problem List  Past Medical History:  Diagnosis Date  . Arthritis   . 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 Blood transfusion   . Carotid bruit    3-11 carotid u/s was  (-)  . 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 GERD (gastroesophageal reflux disease)   . HYPERLIPIDEMIA 05/03/2006  . HYPERTENSION 05/03/2006  .  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 PEPTIC ULCER DISEASE 07/22/2008   per EGD 6-10 .Marland Kitchen ulcer duodenitis     Past Surgical  History:  Procedure Laterality Date  . CERVICAL FUSION    . [place]   right  . ROTATOR CUFF REPAIR     right  . SPINAL FUSION       Allergies  No Known Allergies   Home Medications  Prior to Admission medications   Medication Sig Start Date End Date Taking? Authorizing Provider  aspirin EC 325 MG tablet Take 325 mg by mouth daily.    Yes Historical Provider, MD  Cholecalciferol (VITAMIN D3) 5000 UNITS TABS Take 5,000 Units by mouth daily.   Yes Historical Provider, MD  cyclobenzaprine (FLEXERIL) 5 MG tablet Take 5 mg by mouth 3 (three) times daily as needed for muscle spasms.   Yes Historical Provider, MD  ixazomib citrate (NINLARO) 3 MG capsule Take 1 capsule every 2 weeks on an empty stomach. 09/22/15  Yes [name], MD  metoprolol (LOPRESSOR) 100 MG tablet Take 0.5 tablets (50 mg total) by mouth 2 (two) times daily. 03/02/15  Yes Colon Branch, MD  Multiple Vitamin (MULTIVITAMIN) tablet Take 1 tablet by mouth daily.   Yes Historical Provider, MD  niacin 500 MG tablet Take 500 mg by mouth daily.    Yes Historical Provider, MD  Omega-3 Fatty Acids (FISH OIL) 1200 MG CAPS Take 1,200 mg by mouth daily.   Yes Historical Provider, MD  pantoprazole (PROTONIX) 40 MG tablet Take 1 tablet (40 mg total) by mouth daily. 04/18/15  Yes Colon Branch, MD  POMALYST 3 MG capsule Take 1 capsule by mouth daily. Take with water on days 1-21. Repeat every 28 days. AUTH# [id number] 10/18/15  Yes [name], MD  Probiotic Product (PROBIOTIC PO) Take 1 tablet by mouth daily.    Yes Historical Provider, MD  traMADol (ULTRAM) 50 MG tablet Take 0.5-1 tablets (25-50 mg total) by mouth 2 (two) times daily as needed. 10/19/15  Yes Colon Branch, MD    Family History  Family History  Problem Relation Age of Onset  . Kidney cancer Father   . Leukemia Mother   . Pulmonary embolism Brother   . Stroke Maternal Grandmother   . Heart attack Maternal Grandfather   . Stroke Paternal Grandmother   .  Cerebral palsy Son   . Prostate cancer Neg Hx   . Colon cancer Neg Hx   . Esophageal cancer Neg Hx   . Rectal cancer Neg Hx   . Stomach cancer Neg Hx    Family Status  Relation Status  . Father Deceased  . Mother Deceased  . Brother Deceased  . Maternal Grandmother Deceased  . Maternal Grandfather Deceased  . Paternal Grandmother Deceased  . Sister Alive  . Daughter Alive  . Son Alive  . Paternal Grandfather Deceased  . Sister Alive  . Brother Alive  . Daughter Alive  . Daughter Alive  . Neg Hx     Social History  Social History   Social History  . Marital status: Married    Spouse name: N/A  . Number of children: 4  . Years of education: N/A   Occupational History  . retired    Social History Main Topics  . Smoking status: Former Smoker    Packs/day: 0.25    Years: 20.00  Types: Cigarettes    Start date: 04/28/1954    Quit date: 11/28/1974  . Smokeless tobacco: Never Used     Comment: quit 35 years ago, 1976  . Alcohol use 0.0 oz/week     Comment: Rarely   . Drug use: No  . Sexual activity: Not on file   Other Topics Concern  . Not on file   Social History Narrative   Household-- pt, wife, son      All other systems reviewed and are otherwise negative except as noted above.  Physical Exam  Blood pressure 125/81, pulse 90, resp. rate 13, height 5' 7"  (1.702 m), weight 215 lb (97.5 kg), SpO2 93 %.  General: Pleasant male seems in a pain Psych: Normal affect. Neuro: Alert and oriented X 3. Moves all extremities spontaneously. HEENT: Normal  Neck: Supple without bruits or JVD. Lungs:  Resp regular and unlabored, CTA. Heart: RRR no s3, s4, or murmurs. Abdomen: Soft, non-tender, non-distended, BS + x 4.  Extremities: No clubbing, cyanosis or edema. DP/PT/Radials 2+ and equal bilaterally.  Labs   Recent Labs  11/18/15 1153  TROPONINI <0.03   Lab Results  Component Value Date   WBC 4.2 11/18/2015   HGB 12.4 (L) 11/18/2015   HCT 38.5 (L)  11/18/2015   MCV 102.7 (H) 11/18/2015   PLT 150 11/18/2015    Recent Labs Lab 11/18/15 1153  NA 137  K 3.8  CL 102  CO2 25  BUN 12  CREATININE 0.91  CALCIUM 9.4  PROT 7.3  BILITOT 0.7  ALKPHOS 41  ALT 26  AST 28  GLUCOSE 159*   Lab Results  Component Value Date   CHOL 160 02/07/2015   HDL 42.60 02/07/2015   LDLCALC 90 02/07/2015   TRIG 133.0 02/07/2015   No results found for: DDIMER   Radiology/Studies  Dg Chest 2 View  Result Date: 11/18/2015 CLINICAL DATA:  Sharp chest pain.  Numbness. EXAM: CHEST  2 VIEW COMPARISON:  01/26/2010 FINDINGS: The heart size and mediastinal contours are within normal limits. Both lungs are clear. No acute osseous abnormality. Attenuation of the distal right clavicle likely secondary to prior subacromial decompression/clavicular section. IMPRESSION: No active cardiopulmonary disease. Electronically Signed   By: Kathreen Devoid   On: 11/18/2015 12:35    ASSESSMENT AND PLAN  1. Left chest and back pain: R/O Unstable angina vs other - He was in USOH up-until this morning. Constant  9 out of 10 left-sided chest pressure radiating to his shoulder and back. Minimally responsive to sublingual nitroglycerin and IV nitro. Continue IV heparin. Point of care troponin negative. Troponin I less than 0.03. Initially EKG concerning for inferior ST depression at rate of 101bpm. Repeat EKG is reassuring with slow rate. Will get CT of chest to r/o PE and look for dissection. Echo tomorrow. No improvement with GT cocktail and fentanyl 50 mcg x 2. Continue PRN fentanyl. Cycle troponin and serial EKG. Myoview tomorrow, if troponin elevated severely--> cancel Myoview and plan for cath Monday. Cannot r/o bone related pain due to multiple myeloma.   2. HTN - Stable  3. Multiple myeloma - Will take Pomalyst from home. - Followed by Dr. Arelia Sneddon  Signed, Bhagat,Bhavinkumar, PA-C 11/18/2015, 4:16 PM Pager [phone]

## 2015-11-18 NOTE — Progress Notes (Signed)
ANTICOAGULATION CONSULT NOTE - Follow Up Consult  Pharmacy Consult for Heparin  Indication: chest pain/ACS  No Known Allergies  Patient Measurements: Height: 5\' 7"  (170.2 cm) Weight: 218 lb 3.2 oz (99 kg) IBW/kg (Calculated) : 66.1  Vital Signs: Temp: 97.4 F (36.3 C) (10/27 2226) Temp Source: Oral (10/27 2226) BP: 129/71 (10/27 2226) Pulse Rate: 92 (10/27 2226)  Labs:  Recent Labs  11/18/15 1153 11/18/15 2211  HGB 12.4*  --   HCT 38.5*  --   PLT 150  --   HEPARINUNFRC  --  0.11*  CREATININE 0.91  --   TROPONINI <0.03  --     Estimated Creatinine Clearance: 76.3 mL/min (by C-G formula based on SCr of 0.91 mg/dL).   Assessment: Heparin for CP, undergoing cardiac work-up, on heparin, initial anti-Xa level is low  Goal of Therapy:  Heparin level 0.3-0.7 units/ml Monitor platelets by anticoagulation protocol: Yes   Plan:  -Inc heparin to 1500 units/hr -0500 HL  [place], Ki 11/18/2015,11:17 PM

## 2015-11-18 NOTE — ED Notes (Signed)
Cardiologist in room

## 2015-11-18 NOTE — ED Notes (Signed)
Attempted report x 2 

## 2015-11-18 NOTE — ED Notes (Signed)
Pharmacy called about infusions

## 2015-11-18 NOTE — ED Notes (Signed)
Attempted report 

## 2015-11-18 NOTE — ED Triage Notes (Signed)
Patient states mid to L chest pain that started this morning.   Patient was 10/10 pain with EMS, but decreased pain to 4/10 with NTG x 3 SL.  Patient was given 325 of ASA also.  Per EMS, 20 G R hand was placed by EMS.   Patient stating still having pain.   Patient states some SOB, but denies other symptoms.

## 2015-11-19 ENCOUNTER — Observation Stay (HOSPITAL_COMMUNITY): Payer: PPO

## 2015-11-19 ENCOUNTER — Observation Stay (HOSPITAL_BASED_OUTPATIENT_CLINIC_OR_DEPARTMENT_OTHER): Payer: PPO

## 2015-11-19 DIAGNOSIS — I10 Essential (primary) hypertension: Secondary | ICD-10-CM | POA: Diagnosis not present

## 2015-11-19 DIAGNOSIS — Z87891 Personal history of nicotine dependence: Secondary | ICD-10-CM | POA: Diagnosis not present

## 2015-11-19 DIAGNOSIS — Z79899 Other long term (current) drug therapy: Secondary | ICD-10-CM | POA: Diagnosis not present

## 2015-11-19 DIAGNOSIS — R072 Precordial pain: Secondary | ICD-10-CM | POA: Diagnosis not present

## 2015-11-19 DIAGNOSIS — Z7982 Long term (current) use of aspirin: Secondary | ICD-10-CM | POA: Diagnosis not present

## 2015-11-19 DIAGNOSIS — R079 Chest pain, unspecified: Secondary | ICD-10-CM | POA: Diagnosis not present

## 2015-11-19 DIAGNOSIS — I2510 Atherosclerotic heart disease of native coronary artery without angina pectoris: Secondary | ICD-10-CM | POA: Diagnosis not present

## 2015-11-19 LAB — CBC
HEMATOCRIT: 33.4 % — AB (ref 39.0–52.0)
HEMOGLOBIN: 10.9 g/dL — AB (ref 13.0–17.0)
MCH: 33.7 pg (ref 26.0–34.0)
MCHC: 32.6 g/dL (ref 30.0–36.0)
MCV: 103.4 fL — ABNORMAL HIGH (ref 78.0–100.0)
Platelets: 132 10*3/uL — ABNORMAL LOW (ref 150–400)
RBC: 3.23 MIL/uL — AB (ref 4.22–5.81)
RDW: 16.9 % — ABNORMAL HIGH (ref 11.5–15.5)
WBC: 3.9 10*3/uL — ABNORMAL LOW (ref 4.0–10.5)

## 2015-11-19 LAB — LIPID PANEL
CHOL/HDL RATIO: 3.8 ratio
CHOLESTEROL: 168 mg/dL (ref 0–200)
HDL: 44 mg/dL (ref >40)
LDL Cholesterol: 73 mg/dL (ref 0–99)
TRIGLYCERIDES: 257 mg/dL — AB (ref <150)
VLDL: 51 mg/dL — ABNORMAL HIGH (ref 0–40)

## 2015-11-19 LAB — NM MYOCAR MULTI W/SPECT W/WALL MOTION / EF
CHL CUP RESTING HR STRESS: 88 {beats}/min
CSEPED: 0 min
CSEPEW: 1 METS
CSEPHR: 77 %
Exercise duration (sec): 0 s
MPHR: 143 {beats}/min
Peak HR: 111 {beats}/min

## 2015-11-19 LAB — TROPONIN I
Troponin I: <0.03 ng/mL (ref <0.03)
Troponin I: <0.03 ng/mL (ref <0.03)

## 2015-11-19 LAB — BASIC METABOLIC PANEL
Anion gap: 9 (ref 5–15)
BUN: 12 mg/dL (ref 6–20)
CHLORIDE: 105 mmol/L (ref 101–111)
CO2: 24 mmol/L (ref 22–32)
Calcium: 8.7 mg/dL — ABNORMAL LOW (ref 8.9–10.3)
Creatinine, Ser: 0.78 mg/dL (ref 0.61–1.24)
GFR calc Af Amer: >60 mL/min (ref >60)
GFR calc non Af Amer: >60 mL/min (ref >60)
Glucose, Bld: 104 mg/dL — ABNORMAL HIGH (ref 65–99)
POTASSIUM: 3.7 mmol/L (ref 3.5–5.1)
SODIUM: 138 mmol/L (ref 135–145)

## 2015-11-19 LAB — MRSA PCR SCREENING: MRSA by PCR: NEGATIVE

## 2015-11-19 LAB — HEPARIN LEVEL (UNFRACTIONATED)
Heparin Unfractionated: 0.1 IU/mL — ABNORMAL LOW (ref 0.30–0.70)
Heparin Unfractionated: 0.31 IU/mL (ref 0.30–0.70)

## 2015-11-19 LAB — TSH: TSH: 2.304 u[IU]/mL (ref 0.350–4.500)

## 2015-11-19 MED ORDER — REGADENOSON 0.4 MG/5ML IV SOLN
INTRAVENOUS | Status: AC
  Administered 2015-11-19: 0.4 mg
  Filled 2015-11-19: qty 5

## 2015-11-19 MED ORDER — ENOXAPARIN SODIUM 40 MG/0.4ML ~~LOC~~ SOLN
40.0000 mg | SUBCUTANEOUS | Status: DC
  Administered 2015-11-19: 40 mg via SUBCUTANEOUS
  Filled 2015-11-19: qty 0.4

## 2015-11-19 MED ORDER — TECHNETIUM TC 99M TETROFOSMIN IV KIT
30.0000 | PACK | Freq: Once | INTRAVENOUS | Status: AC | PRN
  Administered 2015-11-19: 30 via INTRAVENOUS

## 2015-11-19 MED ORDER — POMALIDOMIDE 3 MG PO CAPS
3.0000 mg | ORAL_CAPSULE | Freq: Every day | ORAL | Status: DC
  Administered 2015-11-19: 3 mg via ORAL
  Filled 2015-11-19 (×2): qty 1

## 2015-11-19 MED ORDER — TECHNETIUM TC 99M TETROFOSMIN IV KIT
10.0000 | PACK | Freq: Once | INTRAVENOUS | Status: AC | PRN
  Administered 2015-11-19: 10 via INTRAVENOUS

## 2015-11-19 NOTE — Progress Notes (Signed)
ANTICOAGULATION CONSULT NOTE - Follow Up Consult  Pharmacy Consult for Heparin  Indication: chest pain/ACS  No Known Allergies  Patient Measurements: Height: 5\' 7"  (170.2 cm) Weight: 220 lb 11.2 oz (100.1 kg) IBW/kg (Calculated) : 66.1  Vital Signs: Temp: 98.4 F (36.9 C) (10/28 0832) Temp Source: Oral (10/28 0832) BP: 166/88 (10/28 1247) Pulse Rate: 101 (10/28 1247)  Labs:  Recent Labs  11/18/15 1153 11/18/15 2211 11/18/15 2312 11/19/15 0449 11/19/15 1355  HGB 12.4*  --   --  10.9*  --   HCT 38.5*  --   --  33.4*  --   PLT 150  --   --  132*  --   HEPARINUNFRC  --  0.11*  --  0.10* 0.31  CREATININE 0.91  --   --  0.78  --   TROPONINI <0.03  --  <0.03 <0.03 <0.03    Estimated Creatinine Clearance: 87.2 mL/min (by C-G formula based on SCr of 0.78 mg/dL).   Assessment: 75yo M with h/o CAD admitted for chest pain, on heparin gtt while undergoing cardiac work-up. HL therapeutic at 0.31, CBC low stable, no bleeding documented  Goal of Therapy:  Heparin level 0.3-0.7 units/ml Monitor platelets by anticoagulation protocol: Yes   Plan:  -Continue heparin at 1800 units/hr -Check heparin level in 8 hours and daily while on heparin -Monitor CBC, s/sx of bleeding   Gwenlyn Perking, PharmD PGY1 Pharmacy Resident Pager: [phone] 11/19/2015 3:13 PM

## 2015-11-19 NOTE — Progress Notes (Signed)
Patient Name: Gene Hill Date of Encounter: 11/19/2015  Primary Cardiologist: Dr. [facility] Problem List     Principal Problem:   Chest pain at rest Active Problems:   Multiple myeloma not having achieved remission ([place])   Hyperlipidemia   Essential hypertension   Coronary atherosclerosis   BARRETTS ESOPHAGUS   Backache     Subjective   Still a sore spot over L chest pain, however significantly improved. No SOB. Seen in nuc med  Inpatient Medications    Scheduled Meds: . acidophilus   Oral Daily  . aspirin EC  325 mg Oral Daily  . cholecalciferol  5,000 Units Oral Daily  . metoprolol  50 mg Oral BID  . multivitamin with minerals  1 tablet Oral Daily  . niacin  500 mg Oral Daily  . omega-3 acid ethyl esters  1 g Oral Daily  . pantoprazole  40 mg Oral Daily   Continuous Infusions: . sodium chloride 125 mL/hr at 11/18/15 1549  . heparin 1,800 Units/hr (11/19/15 0814)   PRN Meds: acetaminophen, nitroGLYCERIN, ondansetron (ZOFRAN) IV, traMADol   Vital Signs    Vitals:   11/19/15 1101 11/19/15 1112 11/19/15 1115 11/19/15 1116  BP: 132/84 (!) 141/83 (!) 144/81 (!) 144/73  Pulse:      Resp:      Temp:      TempSrc:      SpO2:      Weight:      Height:        Intake/Output Summary (Last 24 hours) at 11/19/15 1158 Last data filed at 11/19/15 0811  Gross per 24 hour  Intake          3305.72 ml  Output              200 ml  Net          3105.72 ml   Filed Weights   11/18/15 1151 11/18/15 2226 11/19/15 0500  Weight: 215 lb (97.5 kg) 218 lb 3.2 oz (99 kg) 220 lb 11.2 oz (100.1 kg)    Physical Exam    GEN: Well nourished, well developed, in no acute distress.  HEENT: Grossly normal.  Neck: Supple, no JVD, carotid bruits, or masses. Cardiac: RRR, no rubs, or gallops. No clubbing, cyanosis, edema.  Radials/DP/PT 2+ and equal bilaterally.  1/6 systolic murmur over RUSB Respiratory:  Respirations regular and unlabored, clear to auscultation  bilaterally. GI: Soft, nontender, nondistended, BS + x 4. MS: no deformity or atrophy. Skin: warm and dry, no rash. Neuro:  Strength and sensation are intact. Psych: AAOx3.  Normal affect.  Labs    CBC  Recent Labs  11/18/15 1153 11/19/15 0449  WBC 4.2 3.9*  HGB 12.4* 10.9*  HCT 38.5* 33.4*  MCV 102.7* 103.4*  PLT 150 893*   Basic Metabolic Panel  Recent Labs  11/18/15 1153 11/19/15 0449  NA 137 138  K 3.8 3.7  CL 102 105  CO2 25 24  GLUCOSE 159* 104*  BUN 12 12  CREATININE 0.91 0.78  CALCIUM 9.4 8.7*   Liver Function Tests  Recent Labs  11/18/15 1153  AST 28  ALT 26  ALKPHOS 41  BILITOT 0.7  PROT 7.3  ALBUMIN 3.6   Cardiac Enzymes  Recent Labs  11/18/15 1153 11/18/15 2312 11/19/15 0449  TROPONINI <0.03 <0.03 <0.03   Fasting Lipid Panel  Recent Labs  11/19/15 0449  CHOL 168  HDL 44  LDLCALC 73  TRIG 257*  CHOLHDL 3.8  Thyroid Function Tests  Recent Labs  11/18/15 2312  TSH 2.304    Telemetry    NSR without significant ventricular ectopy - Personally Reviewed  ECG    NSR without significant ST-T wave changes  - Personally Reviewed  Radiology    Dg Chest 2 View  Result Date: 11/18/2015 CLINICAL DATA:  Sharp chest pain.  Numbness. EXAM: CHEST  2 VIEW COMPARISON:  01/26/2010 FINDINGS: The heart size and mediastinal contours are within normal limits. Both lungs are clear. No acute osseous abnormality. Attenuation of the distal right clavicle likely secondary to prior subacromial decompression/clavicular section. IMPRESSION: No active cardiopulmonary disease. Electronically Signed   By: Kathreen Devoid   On: 11/18/2015 12:35   Ct Angio Chest Pe W Or Wo Contrast  Result Date: 11/18/2015 CLINICAL DATA:  Chest pain. Remote smoking history. Multiple myeloma. EXAM: CT ANGIOGRAPHY CHEST WITH CONTRAST TECHNIQUE: Multidetector CT imaging of the chest was performed using the standard protocol during bolus administration of intravenous  contrast. Multiplanar CT image reconstructions and MIPs were obtained to evaluate the vascular anatomy. CONTRAST:  70 cc Isovue 370 IV. COMPARISON:  11/18/2014 PET-CT. Chest radiograph from earlier today. FINDINGS: Cardiovascular: The study is high quality for the evaluation of pulmonary embolism. There are no filling defects in the central, lobar, segmental or subsegmental pulmonary artery branches to suggest acute pulmonary embolism. Atherosclerotic nonaneurysmal thoracic aorta. Normal caliber pulmonary arteries. Normal heart size. No significant pericardial fluid/thickening. Left main, left anterior descending, left circumflex and right coronary atherosclerosis. Mediastinum/Nodes: No discrete thyroid nodules. Unremarkable esophagus. No pathologically enlarged axillary, mediastinal or hilar lymph nodes. Calcified right paratracheal, AP window and left hilar nodes from prior granulomatous disease. Lungs/Pleura: No pneumothorax. Subsegmental scarring versus atelectasis in the mid to lower lungs bilaterally. No acute consolidative airspace disease, lung masses or significant pulmonary nodules in the aerated portions of the lungs. Upper abdomen: Simple appearing 3.8 cm renal cyst in the medial upper left kidney. Granulomatous calcifications are scattered throughout the normal size spleen. Musculoskeletal: Expansile lytic lateral right second rib bone lesion is stable in size and appearance since 11/18/2014. No new thoracic bone lesions. Marked thoracic spondylosis. Partially visualized surgical hardware from ACDF overlies the lower cervical spine. Review of the MIP images confirms the above findings. IMPRESSION: 1. No pulmonary embolism. 2. Mild scarring versus atelectasis in the mid to lower lungs bilaterally. Otherwise no active pulmonary disease. 3. Aortic atherosclerosis. Left main and 3 vessel coronary atherosclerosis. 4. Expansile lytic lateral right second rib lesion, stable since 11/18/2014 PET-CT study,  consistent with the history of multiple myeloma. No new bone lesions in the chest. Electronically Signed   By: Ilona Sorrel M.D.   On: 11/18/2015 18:27    Cardiac Studies   Nonobstructive CAD by cath 1998 by Dr. Rex Kras.   Patient Profile      75 y.o. male with a known history of CAD, hypertension, hyperlipidemia, multiple myeloma (followed by Dr. Arelia Sneddon) presented for evaluation of chest pain.  Assessment & Plan    1. Chest pain: previously known nonobstructive CAD in 1998 by cath  - serial trop negative overnight, pending lexiscan myoview today.   2. Hypertension: stable  3. Multiple myeloma: followed by Dr. Marin Olp  Signed, Almyra Deforest, PA  11/19/2015, 11:58 AM   Patient seen agree with above.  Awaiting results of Lexiscan.  Kerry Hough MD [facility] 12:29 PM

## 2015-11-19 NOTE — Progress Notes (Signed)
Patient underwent 1 day lexiscan myoview without significant complication.  Hilbert Corrigan PA Pager: [phone]

## 2015-11-19 NOTE — Progress Notes (Signed)
ANTICOAGULATION CONSULT NOTE - Follow Up Consult  Pharmacy Consult for Heparin  Indication: chest pain/ACS  No Known Allergies  Patient Measurements: Height: 5\' 7"  (170.2 cm) Weight: 220 lb 11.2 oz (100.1 kg) IBW/kg (Calculated) : 66.1  Vital Signs: Temp: 98.4 F (36.9 C) (10/28 0500) Temp Source: Oral (10/28 0500) BP: 147/89 (10/28 0500) Pulse Rate: 73 (10/28 0500)  Labs:  Recent Labs  11/18/15 1153 11/18/15 2211 11/18/15 2312 11/19/15 0449  HGB 12.4*  --   --  10.9*  HCT 38.5*  --   --  33.4*  PLT 150  --   --  132*  HEPARINUNFRC  --  0.11*  --  0.10*  CREATININE 0.91  --   --  0.78  TROPONINI <0.03  --  <0.03 <0.03    Estimated Creatinine Clearance: 87.2 mL/min (by C-G formula based on SCr of 0.78 mg/dL).   Assessment: Heparin for CP, undergoing cardiac work-up, on heparin, anti-Xa remains low despite rate increase  Goal of Therapy:  Heparin level 0.3-0.7 units/ml Monitor platelets by anticoagulation protocol: Yes   Plan:  -Inc heparin to 1800 units/hr -1300 HL  Gene Hill, Gene Hill 11/19/2015,5:53 AM

## 2015-11-19 NOTE — Progress Notes (Signed)
MPI is normal with EF of 67%.  Wpuld d/c heparin.  Kerry Hough MD [facility] 4:20 PM

## 2015-11-20 ENCOUNTER — Other Ambulatory Visit: Payer: Self-pay | Admitting: Physician Assistant

## 2015-11-20 ENCOUNTER — Other Ambulatory Visit (HOSPITAL_COMMUNITY): Payer: PPO

## 2015-11-20 DIAGNOSIS — Z87891 Personal history of nicotine dependence: Secondary | ICD-10-CM | POA: Diagnosis not present

## 2015-11-20 DIAGNOSIS — R079 Chest pain, unspecified: Secondary | ICD-10-CM | POA: Diagnosis not present

## 2015-11-20 DIAGNOSIS — I2510 Atherosclerotic heart disease of native coronary artery without angina pectoris: Secondary | ICD-10-CM | POA: Diagnosis not present

## 2015-11-20 DIAGNOSIS — I10 Essential (primary) hypertension: Secondary | ICD-10-CM | POA: Diagnosis not present

## 2015-11-20 DIAGNOSIS — R072 Precordial pain: Secondary | ICD-10-CM | POA: Diagnosis not present

## 2015-11-20 DIAGNOSIS — Z79899 Other long term (current) drug therapy: Secondary | ICD-10-CM | POA: Diagnosis not present

## 2015-11-20 DIAGNOSIS — Z7982 Long term (current) use of aspirin: Secondary | ICD-10-CM | POA: Diagnosis not present

## 2015-11-20 NOTE — Progress Notes (Addendum)
Subjective:  Feels better today with no recurrent chest pain overnight.  Myocardial perfusion scan showed no ischemia yesterday.  He has not ambulated yet today.  Objective:  Vital Signs in the last 24 hours: BP (!) 152/84 (BP Location: Left Arm)   Pulse 82   Temp 98.3 F (36.8 C) (Oral)   Resp (!) 21   Ht 5\' 7"  (1.702 m)   Wt 98.9 kg (218 lb)   SpO2 94%   BMI 34.14 kg/m   Physical Exam: Pleasant white male in no acute distress Lungs:  Clear Cardiac:  Regular rhythm, normal S1 and S2, no S3 Extremities:  No edema present  Intake/Output from previous day: 10/28 0701 - 10/29 0700 In: 1970 [P.O.:720; I.V.:1250] Out: 2800 [Urine:2800]  Weight Filed Weights   11/18/15 2226 11/19/15 0500 11/20/15 0500  Weight: 99 kg (218 lb 3.2 oz) 100.1 kg (220 lb 11.2 oz) 98.9 kg (218 lb)    Lab Results: Basic Metabolic Panel:  Recent Labs  11/18/15 1153 11/19/15 0449  NA 137 138  K 3.8 3.7  CL 102 105  CO2 25 24  GLUCOSE 159* 104*  BUN 12 12  CREATININE 0.91 0.78   CBC:  Recent Labs  11/18/15 1153 11/19/15 0449  WBC 4.2 3.9*  HGB 12.4* 10.9*  HCT 38.5* 33.4*  MCV 102.7* 103.4*  PLT 150 132*   Cardiac Enzymes: Troponin (Point of Care Test)  Recent Labs  11/18/15 1210  TROPIPOC 0.00   Cardiac Panel (last 3 results)  Recent Labs  11/18/15 2312 11/19/15 0449 11/19/15 1355  TROPONINI <0.03 <0.03 <0.03    Telemetry: Sinus rhythm  Assessment/Plan:  1.  Chest discomfort that is resolved probable noncardiac by description and at this point I think he can go home today if he does not have discomfort with ambulation  Walk in hall and if stable go home with follow-up with his regular cardiologist in a few weeks.  He has has not had his echo completed but I don't think he needs to wait for that if he is able to go home earlier.      Kerry Hough  MD [facility]  11/20/2015, 8:01 AM

## 2015-11-20 NOTE — Progress Notes (Signed)
Pt ambulated in the hall way and denied any CP or SOB. Will cont to monitor pt.

## 2015-11-20 NOTE — Discharge Summary (Signed)
Discharge Summary    Patient ID: Gene Hill,  MRN: [id number], DOB/AGE: [birth year] 75 y.o.  Admit date: 11/18/2015 Discharge date: 11/20/2015  Primary Care Provider: [name] Primary Cardiologist: Dr. Curt Bears  Discharge Diagnoses    Principal Problem:   Chest pain at rest Active Problems:   Multiple myeloma not having achieved remission [facility])   Hyperlipidemia   Essential hypertension   Coronary atherosclerosis   BARRETTS ESOPHAGUS   Backache   Allergies No Known Allergies  Diagnostic Studies/Procedures    Myoview 11/19/2015 IMPRESSION: 1. No reversible ischemia or infarction.  2. Normal left ventricular wall motion.  3. Left ventricular ejection fraction 67%  4. Non invasive risk stratification*: Low  _____________   History of Present Illness     Gene Hill is a 75 y.o. male with a known history of CAD, hypertension, hyperlipidemia, multiple myeloma (followed by Dr. Arelia Sneddon) presented for evaluation of chest pain. He does have a remote history of cath in 1998 by Dr. Rex Kras, managed medically with aspirin 314m and beta blocker. Negative Cardiolite in 03/2005. No reports available for review. He was recently seen by Dr. 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 He was cleared for surgery without any intervention as he was asymptomatic.  This morning around 8:30 AM after he woke up he had sudden onset of left upper chest pressure and clamminess. This lasted for 3-4 hours with 8-9/10 severity and EMS was called. He was given sublingual nitroglycerin x 3 with minimal improvement and again his pain exacerbated. His EKG shows sinus rhythm at rate of 101 bpm with questionable ST segment depression inferior lead. Point-of-care troponin negative. Troponin I negative. Electrolytes normal. Chest x-ray clear. He was started on IV heparin and IV nitroglycerin for symptoms concerning of unstable angina by ED provider.  During my conversation patient states that he constantly have 9 out of 10 left upper chest pain and occasional radiation to upper back and shoulder. Mild shortness of breath. He denies nausea, vomiting, lower extremity edema, orthopnea, PND, syncope, palpitation, melena, blood in his stool or urine.    Hospital Course     Patient was admitted to cardiology service, serial troponin overnight was negative. He underwent nuclear stress test on 11/19/2015 which came back low risk, ejection fraction 67%, no reversible ischemia or infarction. Due to persistent chest discomfort, he was kept in the hospital one more day for observation. He was seen the morning of 11/20/2015, at which time he was doing well without significant chest discomfort. He was able to ablate in the hallway without exertional symptom. He is deemed stable for discharge from cardiology perspective. He does have a cardiac murmur, however echocardiogram was delayed and was not able to be done in the hospital. Otherwise patient is very stable at this point, we plan to pursue outpatient echocardiogram and I have also send staff message to our scheduler to contact the patient to arrange follow-up in 4-6 weeks.  _____________  Discharge Vitals Blood pressure (!) 152/84, pulse 82, temperature 98.3 F (36.8 C), temperature source Oral, resp. rate (!) 21, height _0  (1.702 m), weight 218 lb (98.9 kg), SpO2 94 %.  Filed Weights   11/18/15 2226 11/19/15 0500 11/20/15 0500  Weight: 218 lb 3.2 oz (99 kg) 220 lb 11.2 oz (100.1 kg) 218 lb (98.9 kg)    Labs & Radiologic Studies     CBC  Recent Labs  11/18/15 1153 11/19/15 0449  WBC  4.2 3.9*  HGB 12.4* 10.9*  HCT 38.5* 33.4*  MCV 102.7* 103.4*  PLT 150 324*   Basic Metabolic Panel  Recent Labs  11/18/15 1153 11/19/15 0449  NA 137 138  K 3.8 3.7  CL 102 105  CO2 25 24  GLUCOSE 159* 104*  BUN 12 12  CREATININE 0.91 0.78  CALCIUM 9.4 8.7*   Liver Function Tests  Recent  Labs  11/18/15 1153  AST 28  ALT 26  ALKPHOS 41  BILITOT 0.7  PROT 7.3  ALBUMIN 3.6   Cardiac Enzymes  Recent Labs  11/18/15 2312 11/19/15 0449 11/19/15 1355  TROPONINI <0.03 <0.03 <0.03   Fasting Lipid Panel  Recent Labs  11/19/15 0449  CHOL 168  HDL 44  LDLCALC 73  TRIG 257*  CHOLHDL 3.8   Thyroid Function Tests  Recent Labs  11/18/15 2312  TSH 2.304    Dg Chest 2 View  Result Date: 11/18/2015 CLINICAL DATA:  Sharp chest pain.  Numbness. EXAM: CHEST  2 VIEW COMPARISON:  01/26/2010 FINDINGS: The heart size and mediastinal contours are within normal limits. Both lungs are clear. No acute osseous abnormality. Attenuation of the distal right clavicle likely secondary to prior subacromial decompression/clavicular section. IMPRESSION: No active cardiopulmonary disease. Electronically Signed   By: Kathreen Devoid   On: 11/18/2015 12:35   Ct Angio Chest Pe W Or Wo Contrast  Result Date: 11/18/2015 CLINICAL DATA:  Chest pain. Remote smoking history. Multiple myeloma. EXAM: CT ANGIOGRAPHY CHEST WITH CONTRAST TECHNIQUE: Multidetector CT imaging of the chest was performed using the standard protocol during bolus administration of intravenous contrast. Multiplanar CT image reconstructions and MIPs were obtained to evaluate the vascular anatomy. CONTRAST:  70 cc Isovue 370 IV. COMPARISON:  11/18/2014 PET-CT. Chest radiograph from earlier today. FINDINGS: Cardiovascular: The study is high quality for the evaluation of pulmonary embolism. There are no filling defects in the central, lobar, segmental or subsegmental pulmonary artery branches to suggest acute pulmonary embolism. Atherosclerotic nonaneurysmal thoracic aorta. Normal caliber pulmonary arteries. Normal heart size. No significant pericardial fluid/thickening. Left main, left anterior descending, left circumflex and right coronary atherosclerosis. Mediastinum/Nodes: No discrete thyroid nodules. Unremarkable esophagus. No  pathologically enlarged axillary, mediastinal or hilar lymph nodes. Calcified right paratracheal, AP window and left hilar nodes from prior granulomatous disease. Lungs/Pleura: No pneumothorax. Subsegmental scarring versus atelectasis in the mid to lower lungs bilaterally. No acute consolidative airspace disease, lung masses or significant pulmonary nodules in the aerated portions of the lungs. Upper abdomen: Simple appearing 3.8 cm renal cyst in the medial upper left kidney. Granulomatous calcifications are scattered throughout the normal size spleen. Musculoskeletal: Expansile lytic lateral right second rib bone lesion is stable in size and appearance since 11/18/2014. No new thoracic bone lesions. Marked thoracic spondylosis. Partially visualized surgical hardware from ACDF overlies the lower cervical spine. Review of the MIP images confirms the above findings. IMPRESSION: 1. No pulmonary embolism. 2. Mild scarring versus atelectasis in the mid to lower lungs bilaterally. Otherwise no active pulmonary disease. 3. Aortic atherosclerosis. Left main and 3 vessel coronary atherosclerosis. 4. Expansile lytic lateral right second rib lesion, stable since 11/18/2014 PET-CT study, consistent with the history of multiple myeloma. No new bone lesions in the chest. Electronically Signed   By: Ilona Sorrel M.D.   On: 11/18/2015 18:27   Nm Myocar Multi W/spect W/wall Motion / Ef  Result Date: 11/19/2015 CLINICAL DATA:  Chest pain EXAM: MYOCARDIAL IMAGING WITH SPECT (REST AND PHARMACOLOGIC-STRESS) GATED LEFT VENTRICULAR WALL  MOTION STUDY LEFT VENTRICULAR EJECTION FRACTION TECHNIQUE: Standard myocardial SPECT imaging was performed after resting intravenous injection of 10 mCi Tc-22mtetrofosmin. Subsequently, intravenous infusion of Lexiscan was performed under the supervision of the Cardiology staff. At peak effect of the drug, 30 mCi Tc-956metrofosmin was injected intravenously and standard myocardial SPECT imaging was  performed. Quantitative gated imaging was also performed to evaluate left ventricular wall motion, and estimate left ventricular ejection fraction. COMPARISON:  None. FINDINGS: Perfusion: No decreased activity in the left ventricle on stress imaging to suggest reversible ischemia or infarction. Wall Motion: Normal left ventricular wall motion. No left ventricular dilation. Left Ventricular Ejection Fraction: 67 % End diastolic volume 10222l End systolic volume 35 ml IMPRESSION: 1. No reversible ischemia or infarction. 2. Normal left ventricular wall motion. 3. Left ventricular ejection fraction 67% 4. Non invasive risk stratification*: Low *2012 Appropriate Use Criteria for Coronary Revascularization Focused Update: J Am Coll Cardiol. 209798;92(1):194-174http://content.onairportbarriers.comspx?articleid=[email or web address] Electronically Signed   By: SrJulian Hy.D.   On: 11/19/2015 15:02    Disposition   Pt is being discharged home today in good condition.  Follow-up Plans & Appointments    Follow-up Information    Will MaMeredith LeedsMD .   Specialty:  Cardiology Why:  cardiology office scheduler will contact you to arrange 2 weeks outpatient echo and 4-6 weeks followup. Please give [place] call if you do not hear from us[place] 3-4 business days Contact information: [phone]        Jose Paz, MD. Schedule an appointment as soon as possible for a visit today.   Specialty:  Internal Medicine Contact information: [address] [id number]        Discharge Instructions    Diet - low sodium heart healthy    Complete by:  As directed    Increase activity slowly    Complete by:  As directed       Discharge Medications   Current Discharge Medication List    CONTINUE these medications which have NOT CHANGED   Details  aspirin EC 325 MG tablet Take 325 mg by mouth daily.     Cholecalciferol  (VITAMIN D3) 5000 UNITS TABS Take 5,000 Units by mouth daily.    cyclobenzaprine (FLEXERIL) 5 MG tablet Take 5 mg by mouth 3 (three) times daily as needed for muscle spasms.    ixazomib citrate (NINLARO) 3 MG capsule Take 1 capsule every 2 weeks on an empty stomach. Qty: 4 capsule, Refills: 6   Associated Diagnoses: Multiple myeloma in relapse (HCC)    metoprolol (LOPRESSOR) 100 MG tablet Take 0.5 tablets (50 mg total) by mouth 2 (two) times daily. Qty: 90 tablet, Refills: 2    Multiple Vitamin (MULTIVITAMIN) tablet Take 1 tablet by mouth daily.    niacin 500 MG tablet Take 500 mg by mouth daily.     Omega-3 Fatty Acids (FISH OIL) 1200 MG CAPS Take 1,200 mg by mouth daily.    pantoprazole (PROTONIX) 40 MG tablet Take 1 tablet (40 mg total) by mouth daily. Qty: 90 tablet, Refills: 3    POMALYST 3 MG capsule Take 1 capsule by mouth daily. Take with water on days 1-21. Repeat every 28 days. AUTH# [id number]: 21 capsule, Refills: 0    Probiotic Product (PROBIOTIC PO) Take 1 tablet by mouth daily.     traMADol (ULTRAM) 50 MG tablet Take 0.5-1 tablets (  25-50 mg total) by mouth 2 (two) times daily as needed. Qty: 120 tablet, Refills: 0         Outstanding Labs/Studies   Outpatient echo  Duration of Discharge Encounter   Greater than 30 minutes including physician time.  Signed, Almyra Deforest PA-C 11/20/2015, 11:32 AM  Patient seen agree with above  W. Tollie Eth, Brooke Bonito MD [facility] 2:34 PM

## 2015-11-20 NOTE — Discharge Instructions (Signed)

## 2015-11-20 NOTE — Progress Notes (Signed)
Discharge instructions reviewed with patient. Patient has not questions at this time.

## 2015-11-20 NOTE — Progress Notes (Signed)
Patient's medication returned to patient.

## 2015-11-21 ENCOUNTER — Telehealth: Payer: Self-pay | Admitting: *Deleted

## 2015-11-21 ENCOUNTER — Other Ambulatory Visit: Payer: Self-pay | Admitting: *Deleted

## 2015-11-21 MED ORDER — POMALYST 3 MG PO CAPS: ORAL_CAPSULE | ORAL | 0 refills | Status: DC

## 2015-11-21 NOTE — Telephone Encounter (Signed)
Unable to reach patient or leave voice mail because the number on file was invalid.

## 2015-11-23 ENCOUNTER — Other Ambulatory Visit (HOSPITAL_BASED_OUTPATIENT_CLINIC_OR_DEPARTMENT_OTHER): Payer: PPO

## 2015-11-23 ENCOUNTER — Ambulatory Visit (HOSPITAL_BASED_OUTPATIENT_CLINIC_OR_DEPARTMENT_OTHER): Payer: PPO | Admitting: Hematology & Oncology

## 2015-11-23 ENCOUNTER — Ambulatory Visit (HOSPITAL_BASED_OUTPATIENT_CLINIC_OR_DEPARTMENT_OTHER): Payer: PPO

## 2015-11-23 VITALS — BP 144/75 | HR 67 | Temp 98.0°F | Resp 20 | Wt 216.0 lb

## 2015-11-23 DIAGNOSIS — E349 Endocrine disorder, unspecified: Secondary | ICD-10-CM

## 2015-11-23 DIAGNOSIS — C9000 Multiple myeloma not having achieved remission: Secondary | ICD-10-CM

## 2015-11-23 DIAGNOSIS — C9002 Multiple myeloma in relapse: Secondary | ICD-10-CM

## 2015-11-23 LAB — CMP (CANCER CENTER ONLY)
ALK PHOS: 46 U/L (ref 26–84)
ALT: 36 U/L (ref 10–47)
AST: 27 U/L (ref 11–38)
Albumin: 3.4 g/dL (ref 3.3–5.5)
BILIRUBIN TOTAL: 0.6 mg/dL (ref 0.20–1.60)
BUN: 11 mg/dL (ref 7–22)
CALCIUM: 9.6 mg/dL (ref 8.0–10.3)
CO2: 27 meq/L (ref 18–33)
Chloride: 100 mEq/L (ref 98–108)
Creat: 0.8 mg/dl (ref 0.6–1.2)
GLUCOSE: 127 mg/dL — AB (ref 73–118)
Potassium: 3.4 mEq/L (ref 3.3–4.7)
SODIUM: 136 meq/L (ref 128–145)
Total Protein: 7.5 g/dL (ref 6.4–8.1)

## 2015-11-23 LAB — CBC WITH DIFFERENTIAL (CANCER CENTER ONLY)
BASO#: 0.1 10*3/uL (ref 0.0–0.2)
BASO%: 1.6 % (ref 0.0–2.0)
EOS%: 6.4 % (ref 0.0–7.0)
Eosinophils Absolute: 0.2 10*3/uL (ref 0.0–0.5)
HEMATOCRIT: 36.7 % — AB (ref 38.7–49.9)
HGB: 12.2 g/dL — ABNORMAL LOW (ref 13.0–17.1)
LYMPH#: 1.1 10*3/uL (ref 0.9–3.3)
LYMPH%: 33.7 % (ref 14.0–48.0)
MCH: 33.9 pg — ABNORMAL HIGH (ref 28.0–33.4)
MCHC: 33.2 g/dL (ref 32.0–35.9)
MCV: 102 fL — ABNORMAL HIGH (ref 82–98)
MONO#: 0.9 10*3/uL (ref 0.1–0.9)
MONO%: 27.2 % — AB (ref 0.0–13.0)
NEUT#: 1 10*3/uL — ABNORMAL LOW (ref 1.5–6.5)
NEUT%: 31.1 % — AB (ref 40.0–80.0)
PLATELETS: 113 10*3/uL — AB (ref 145–400)
RBC: 3.6 10*6/uL — ABNORMAL LOW (ref 4.20–5.70)
RDW: 15.7 % (ref 11.1–15.7)
WBC: 3.1 10*3/uL — ABNORMAL LOW (ref 4.0–10.0)

## 2015-11-23 MED ORDER — SODIUM CHLORIDE 0.9% FLUSH
20.0000 mL | Freq: Once | INTRAVENOUS | Status: DC
  Filled 2015-11-23: qty 20

## 2015-11-23 MED ORDER — ZOLEDRONIC ACID 4 MG/100ML IV SOLN
4.0000 mg | Freq: Once | INTRAVENOUS | Status: AC
  Administered 2015-11-23: 4 mg via INTRAVENOUS
  Filled 2015-11-23: qty 100

## 2015-11-23 NOTE — Progress Notes (Signed)
Hematology and Oncology Follow Up Visit  Gene Hill [id number] May 08, 1940 75 y.o. 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 is back for followup. He really looks quite good. He has had no problems with the Pomalidomide or Ninlaro. He says he gets constipated with the Ninlaro. He's had no significant fatigue. He is still doing a lot of work outside in his garden.   He recently had to the hospital. He's having some chest wall pain. Thankfully, there is no evidence of cardiac etiology. To me, sounds like he may have had costochondritis.  He will have surgery on his right knee on December 1. This will be done at [facility]. I really think that this will help him out.  I told him to stop the Pomalidomide about 2 weeks before the surgery. He can restart the Pomalidomide a week after surgery.  We last saw him, [name] was 0.8 mg/L. His IgA level was1224 milligrams per deciliter. His Kappa Light chain was 4.3 mg/dL.   Appetite has been doing pretty well. He has had no nausea or vomiting. He's had no leg swelling. He has had the right knee swelling.   There's been no bleeding or bruising.   Overall, his performance status is ECOG 1.   Medications:  Current Outpatient Prescriptions:  .  aspirin EC 325 MG tablet, Take 325 mg by mouth daily. , Disp: , Rfl:  .  Cholecalciferol (VITAMIN D3) 2000 units capsule, Take 2,000 Units by mouth daily. , Disp: , Rfl:  .  cyclobenzaprine (FLEXERIL) 5 MG tablet, Take 5 mg by mouth 3 (three) times daily as needed for muscle spasms., Disp: , Rfl:  .  ixazomib citrate (NINLARO) 3 MG capsule, Take 1 capsule every 2 weeks on an empty stomach., Disp: 4 capsule, Rfl: 6 .  metoprolol (LOPRESSOR) 100 MG tablet,  Take 0.5 tablets (50 mg total) by mouth 2 (two) times daily., Disp: 90 tablet, Rfl: 2 .  Multiple Vitamin (MULTIVITAMIN) tablet, Take 1 tablet by mouth daily., Disp: , Rfl:  .  Omega-3 Fatty Acids (FISH OIL) 1200 MG CAPS, Take 1,200 mg by mouth daily., Disp: , Rfl:  .  pantoprazole (PROTONIX) 40 MG tablet, Take 1 tablet (40 mg total) by mouth daily., Disp: 90 tablet, Rfl: 3 .  POMALYST 3 MG capsule, Take 1 capsule by mouth daily. Take with water on days 1-21. Repeat every 28 days. [id number], Disp: 21 capsule, Rfl: 0 .  Probiotic Product (PROBIOTIC PO), Take 1 tablet by mouth daily. , Disp: , Rfl:  .  traMADol (ULTRAM) 50 MG tablet, Take 0.5-1 tablets (25-50 mg total) by mouth 2 (two) times daily as needed., Disp: 120 tablet, Rfl: 0  Allergies: No Known Allergies  Past Medical History, Surgical history, Social history, and Family History were reviewed and updated.  Review of Systems: As above  Physical Exam:  weight is 216 lb (98 kg). His oral temperature is 98 F (36.7 C). His blood pressure is 144/75 (abnormal) and his pulse is 67. His respiration is 20.  Well-developed and well-nourished white gentleman. Head and neck exam shows no ocular or oral lesions.  He has no palpable lymph nodes. Lungs are clear. Cardiac exam regular rate and rhythm with no murmurs rubs or bruits. Abdomen is soft. He's mildly obese. He has good bowel sounds. There is no fluid wave. There is no palpable liver or spleen tip. Back exam shows no tenderness over the spine ribs or hips. Extremities shows no clubbing cyanosis or edema. Has good range of motion of his joints. Has good muscle strength. Skin exam no rashes. Neurological exam is nonfocal.  Lab Results  Component Value Date   WBC 3.1 (L) 11/23/2015   HGB 12.2 (L) 11/23/2015   HCT 36.7 (L) 11/23/2015   MCV 102 (H) 11/23/2015   PLT 113 (L) 11/23/2015     Chemistry      Component Value Date/Time   NA 136 11/23/2015 1356   NA 141 05/13/2015 0818   K 3.4  11/23/2015 1356   K 4.2 05/13/2015 0818   CL 100 11/23/2015 1356   CO2 27 11/23/2015 1356   CO2 24 05/13/2015 0818   BUN 11 11/23/2015 1356   BUN 18.3 05/13/2015 0818   CREATININE 0.8 11/23/2015 1356   CREATININE 0.9 05/13/2015 0818      Component Value Date/Time   CALCIUM 9.6 11/23/2015 1356   CALCIUM 9.1 05/13/2015 0818   ALKPHOS 46 11/23/2015 1356   ALKPHOS 40 05/13/2015 0818   AST 27 11/23/2015 1356   AST 19 05/13/2015 0818   ALT 36 11/23/2015 1356   ALT 26 05/13/2015 0818   BILITOT 0.60 11/23/2015 1356   BILITOT <0.30 05/13/2015 0818       Impression and Plan: Gene Hill is a 75 year old gentleman with history of IgA kappa myeloma. 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 He was treated with standard therapy and got into remission. 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  His relapsed myeloma is certainly high-risk given the chromosomal abnormalities..  From my point of view, everything looks relatively stable.  I don't see aProblems with him having the surgery. This will be his right knee.  I probably would like to see him back about 3 or 4 weeks after his surgery.  He will get his Zometa today.   I want to make sure that we get him back, that he will be ready to come back. I don't want to aggravation with respect to healing from the knee surgery. [name], MD 11/1/20173:14 PM

## 2015-11-23 NOTE — Patient Instructions (Signed)

## 2015-11-24 LAB — IGG, IGA, IGM
IGG (IMMUNOGLOBIN G), SERUM: 517 mg/dL — AB (ref 700–1600)
IgM, Qn, Serum: 14 mg/dL — ABNORMAL LOW (ref 15–143)

## 2015-11-24 LAB — KAPPA/LAMBDA LIGHT CHAINS
Ig Kappa Free Light Chain: 52.5 mg/L — ABNORMAL HIGH (ref 3.3–19.4)
Ig Lambda Free Light Chain: 14.5 mg/L (ref 5.7–26.3)
KAPPA/LAMBDA FLC RATIO: 3.62 — AB (ref 0.26–1.65)

## 2015-11-28 ENCOUNTER — Other Ambulatory Visit: Payer: Self-pay | Admitting: Nurse Practitioner

## 2015-11-28 LAB — PROTEIN ELECTROPHORESIS, SERUM, WITH REFLEX
A/G RATIO SPE: 1 (ref 0.7–1.7)
ALBUMIN: 3.6 g/dL (ref 2.9–4.4)
ALPHA 1: 0.2 g/dL (ref 0.0–0.4)
ALPHA 2: 0.8 g/dL (ref 0.4–1.0)
BETA: 1.1 g/dL (ref 0.7–1.3)
Gamma Globulin: 1.4 g/dL (ref 0.4–1.8)
Globulin, Total: 3.6 g/dL (ref 2.2–3.9)
Interpretation(See Below): 0
M-Spike, %: 0.7 g/dL — ABNORMAL HIGH
TOTAL PROTEIN: 7.2 g/dL (ref 6.0–8.5)

## 2015-11-29 ENCOUNTER — Telehealth: Payer: Self-pay | Admitting: *Deleted

## 2015-11-29 ENCOUNTER — Other Ambulatory Visit: Payer: Self-pay | Admitting: *Deleted

## 2015-11-29 MED ORDER — POMALYST 3 MG PO CAPS: ORAL_CAPSULE | ORAL | 0 refills | Status: DC

## 2015-11-29 NOTE — Telephone Encounter (Addendum)
Patient aware of results  ----- Message from [name], MD sent at 11/28/2015  4:45 PM EST ----- Call - your myeloma is holding steady at 0.7g/dL.  This is good news.  pete

## 2015-12-01 ENCOUNTER — Telehealth: Payer: Self-pay | Admitting: *Deleted

## 2015-12-01 NOTE — Telephone Encounter (Signed)
Faxed cardiac clearance to [facility] for left total knee replacement on 12/23/2015.  Recent OV and EKG faxed also.  Clearance: Intermediate risk for an intermediate risk procedure.   If possible, would continue ASA and beta blocker throughout procedural time.

## 2015-12-02 ENCOUNTER — Telehealth: Payer: Self-pay | Admitting: *Deleted

## 2015-12-02 NOTE — Telephone Encounter (Signed)
Patient's wife informing office that she is out of funds for ninlaro and that the pomalyst that patient was supposed to start yesterday, still hasn't arrived from the supplier.  She believes that they will just have to stay off the ninlaro until the end of the year. He is planing on holding the pomalyst next week for an upcoming surgery (per MD instructions) and she is now wondering if he should take any before surgery if the shipment arrives.   Dr Marin Olp is okay with patient holding ninlaro until the end of the year. He also wants patient to just hold pomalyst until after his surgery. Patient's wife aware of instructions, confirmed with [employer]back.

## 2015-12-06 ENCOUNTER — Telehealth: Payer: Self-pay | Admitting: Internal Medicine

## 2015-12-06 DIAGNOSIS — M25561 Pain in right knee: Secondary | ICD-10-CM | POA: Diagnosis not present

## 2015-12-06 NOTE — Telephone Encounter (Signed)
Immunization record updated.

## 2015-12-06 NOTE — Telephone Encounter (Signed)
Pt called in to update PCP she received her flu shot on Sept 12,2017 at [facility] on [facility] and [place]

## 2015-12-08 ENCOUNTER — Ambulatory Visit: Payer: Self-pay | Admitting: Physician Assistant

## 2015-12-08 NOTE — H&P (Signed)
TOTAL KNEE ADMISSION H&P  Patient is being admitted for right total knee arthroplasty.  Subjective:  Chief Complaint:right knee pain.  HPI: Gene HLAVAC, 75 y.o. male, has a history of pain and functional disability in the right knee due to arthritis and has failed non-surgical conservative treatments for greater than 12 weeks to includeNSAID's and/or analgesics, corticosteriod injections, activity modification and scope, lateral release.  Onset of symptoms was gradual, starting >10 years ago with gradually worsening course since that time. The patient noted prior procedures on the knee to include  arthroscopy and lateral release on the right knee(s).  Patient currently rates pain in the right knee(s) at 8 out of 10 with activity. Patient has night pain, worsening of pain with activity and weight bearing, pain that interferes with activities of daily living, pain with passive range of motion, crepitus and joint swelling.  Patient has evidence of periarticular osteophytes and joint space narrowing by imaging studies.There is no active infection.  Patient Active Problem List   Diagnosis Date Noted  . Chest pain at rest 11/18/2015  . Chest pain 11/18/2015  . Precordial pain   . Acute left-sided thoracic back pain   . Atherosclerosis of native coronary artery of native heart without angina pectoris   . PCP NOTES >>>>>>>>>>>>>>>>>>>>>>>> 08/22/2015  . Arthritis-- aches, pain 02/05/2014  . Dizziness 02/05/2014  . 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 Annual physical exam 08/14/2010  . 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 Multiple myeloma not having achieved remission ([facility]) 04/15/2009  . CAROTID BRUIT 04/14/2009  . 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 DIVERTICULOSIS, COLON 08/13/2008  . Backache 07/22/2006  . FATIGUE 07/22/2006  . Hyperlipidemia 05/03/2006  . Essential hypertension 05/03/2006  . Coronary atherosclerosis 05/03/2006   . COLONOSCOPY WITH BIOPSY, HX OF 09/22/2002   Past Medical History:  Diagnosis Date  . Arthritis   . 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 Blood transfusion   . Carotid bruit    3-11 carotid u/s was  (-)  . 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 GERD (gastroesophageal reflux disease)   . HYPERLIPIDEMIA 05/03/2006  . HYPERTENSION 05/03/2006  . 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 PEPTIC ULCER DISEASE 07/22/2008   per EGD 6-10 .Marland Kitchen ulcer duodenitis     Past Surgical History:  Procedure Laterality Date  . CERVICAL FUSION    . Victoria   right  . ROTATOR CUFF REPAIR     right  . SPINAL FUSION       (Not in a hospital admission) No Known Allergies  Social History  Substance Use Topics  . Smoking status: Former Smoker    Packs/day: 0.25    Years: 20.00    Types: Cigarettes    Start date: 04/28/1954    Quit date: 11/28/1974  . Smokeless tobacco: Never Used     Comment: quit 35 years ago, 1976  . Alcohol use 0.0 oz/week     Comment: Rarely     Family History  Problem Relation Age of Onset  . Kidney cancer Father   . Leukemia Mother   . Pulmonary embolism Brother   . Stroke Maternal Grandmother   . Heart attack Maternal Grandfather   . Stroke Paternal Grandmother   . Cerebral palsy Son   . Prostate cancer Neg Hx   .  Colon cancer Neg Hx   . Esophageal cancer Neg Hx   . Rectal cancer Neg Hx   . Stomach cancer Neg Hx      Review of Systems  HENT: Positive for hearing loss and tinnitus.   Musculoskeletal: Positive for joint pain.  All other systems reviewed and are negative.   Objective:  Physical Exam  Constitutional: He is oriented to person, place, and time. He appears well-developed and well-nourished. No distress.  HENT:  Head: Normocephalic and atraumatic.  Nose: Nose normal.  Eyes: Conjunctivae and EOM are normal. Pupils are  equal, round, and reactive to light.  Neck: Normal range of motion. Neck supple.  Cardiovascular: Normal rate, regular rhythm, normal heart sounds and intact distal pulses.   Respiratory: Effort normal and breath sounds normal. No respiratory distress. He has no wheezes.  GI: Soft. Bowel sounds are normal. He exhibits no distension. There is no tenderness.  Musculoskeletal:       Right knee: He exhibits swelling. He exhibits normal range of motion and no erythema. Tenderness found.  Lymphadenopathy:    He has no cervical adenopathy.  Neurological: He is alert and oriented to person, place, and time. No cranial nerve deficit.  Skin: Skin is warm and dry. No rash noted. No erythema.  Psychiatric: He has a normal mood and affect. His behavior is normal.    Vital signs in last 24 hours: _0 @  Labs:   Estimated body mass index is 33.83 kg/m as calculated from the following:   Height as of 11/18/15: _1  (1.702 m).   Weight as of 11/23/15: 98 kg (216 lb).   Imaging Review Plain radiographs demonstrate moderate degenerative joint disease of the right knee(s). The overall alignment ismild varus. The bone quality appears to be good for age and reported activity level.  Assessment/Plan:  End stage arthritis, right knee   The patient history, physical examination, clinical judgment of the provider and imaging studies are consistent with end stage degenerative joint disease of the right knee(s) and total knee arthroplasty is deemed medically necessary. The treatment options including medical management, injection therapy arthroscopy and arthroplasty were discussed at length. The risks and benefits of total knee arthroplasty were presented and reviewed. The risks due to aseptic loosening, infection, stiffness, patella tracking problems, thromboembolic complications and other imponderables were discussed. The patient acknowledged the explanation, agreed to proceed with the plan and consent  was signed. Patient is being admitted for inpatient treatment for surgery, pain control, PT, OT, prophylactic antibiotics, VTE prophylaxis, progressive ambulation and ADL's and discharge planning. The patient is planning to be discharged home with home health services

## 2015-12-09 NOTE — Pre-Procedure Instructions (Signed)
[facility]  12/09/2015      [facility] SN:[id number] - [address], [address] [place] [address] Phone: [phone] Fax: [place], [address] [place] [address] Phone: [phone] Fax: [phone]  [place], [place] - [address] [place] [address] Phone: [phone] Fax: [phone]    Your procedure is scheduled on Friday December 1.  Report to [facility] Admitting at 8:00 A.M.  Call this number if you have problems the morning of surgery:  [phone]   Remember:  Do not eat food or drink liquids after midnight.  Take these medicines the morning of surgery with A SIP OF WATER: metoprolol (lopressor), pantoprazole (protonix), tramadol (ultram)  Follow Surgeon's instructions on stopping Aspirin  7 days prior to surgery STOP taking any Aleve, Naproxen, Ibuprofen, Motrin, Advil, Goody's, BC's, all herbal medications, fish oil, and all vitamins    Do not wear jewelry, make-up or nail polish.  Do not wear lotions, powders, or perfumes, or deoderant.  Do not shave 48 hours prior to surgery.  Men may shave face and neck.  Do not bring valuables to the hospital.  [facility] is not responsible for any belongings or valuables.  Contacts, dentures or bridgework may not be worn into surgery.  Leave your suitcase in the car.  After surgery it may be brought to your room.  For patients admitted to the hospital, discharge time will be determined by your treatment team.  Patients discharged the day of surgery will not be allowed to drive home.    Special instructions:    [facility]- Preparing For Surgery  Before surgery, you can play an important role. Because skin is not sterile, your skin needs to be as free of germs as possible. You can  reduce the number of germs on your skin by washing with CHG (chlorahexidine gluconate) Soap before surgery.  CHG is an antiseptic cleaner which kills germs and bonds with the skin to continue killing germs even after washing.  Please do not use if you have an allergy to CHG or antibacterial soaps. If your skin becomes reddened/irritated stop using the CHG.  Do not shave (including legs and underarms) for at least 48 hours prior to first CHG shower. It is OK to shave your face.  Please follow these instructions carefully.   1. Shower the NIGHT BEFORE SURGERY and the MORNING OF SURGERY with CHG.   2. If you chose to wash your hair, wash your hair first as usual with your normal shampoo.  3. After you shampoo, rinse your hair and body thoroughly to remove the shampoo.  4. Use CHG as you would any other liquid soap. You can apply CHG directly to the skin and wash gently with a scrungie or a clean washcloth.   5. Apply the CHG Soap to your body ONLY FROM THE NECK DOWN.  Do not use on open wounds or open sores. Avoid contact with your eyes, ears, mouth and genitals (private parts). Wash genitals (private parts) with your normal soap.  6. Wash thoroughly, paying special attention to the area where your surgery will be performed.  7. Thoroughly rinse your body with warm water from the neck down.  8. DO NOT  shower/wash with your normal soap after using and rinsing off the CHG Soap.  9. Pat yourself dry with a CLEAN TOWEL.   10. Wear CLEAN PAJAMAS   11. Place CLEAN SHEETS on your bed the night of your first shower and DO NOT SLEEP WITH PETS.    Day of Surgery: Do not apply any deodorants/lotions. Please wear clean clothes to the hospital/surgery center.      Please read over the following fact sheets that you were given. MRSA Information

## 2015-12-12 ENCOUNTER — Telehealth: Payer: Self-pay | Admitting: Cardiology

## 2015-12-12 ENCOUNTER — Encounter (HOSPITAL_COMMUNITY): Payer: Self-pay

## 2015-12-12 DIAGNOSIS — Z01812 Encounter for preprocedural laboratory examination: Secondary | ICD-10-CM | POA: Diagnosis not present

## 2015-12-12 DIAGNOSIS — I10 Essential (primary) hypertension: Secondary | ICD-10-CM | POA: Diagnosis not present

## 2015-12-12 DIAGNOSIS — E349 Endocrine disorder, unspecified: Secondary | ICD-10-CM | POA: Diagnosis not present

## 2015-12-12 DIAGNOSIS — I340 Nonrheumatic mitral (valve) insufficiency: Secondary | ICD-10-CM | POA: Diagnosis not present

## 2015-12-12 DIAGNOSIS — R079 Chest pain, unspecified: Secondary | ICD-10-CM | POA: Diagnosis not present

## 2015-12-12 DIAGNOSIS — I2510 Atherosclerotic heart disease of native coronary artery without angina pectoris: Secondary | ICD-10-CM | POA: Diagnosis not present

## 2015-12-12 DIAGNOSIS — M1990 Unspecified osteoarthritis, unspecified site: Secondary | ICD-10-CM | POA: Diagnosis not present

## 2015-12-12 DIAGNOSIS — E785 Hyperlipidemia, unspecified: Secondary | ICD-10-CM | POA: Diagnosis not present

## 2015-12-12 DIAGNOSIS — Z01818 Encounter for other preprocedural examination: Secondary | ICD-10-CM | POA: Diagnosis not present

## 2015-12-12 DIAGNOSIS — M546 Pain in thoracic spine: Secondary | ICD-10-CM | POA: Diagnosis not present

## 2015-12-12 DIAGNOSIS — I071 Rheumatic tricuspid insufficiency: Secondary | ICD-10-CM | POA: Diagnosis not present

## 2015-12-12 DIAGNOSIS — Z9481 Bone marrow transplant status: Secondary | ICD-10-CM | POA: Diagnosis not present

## 2015-12-12 DIAGNOSIS — R0989 Other specified symptoms and signs involving the circulatory and respiratory systems: Secondary | ICD-10-CM | POA: Diagnosis not present

## 2015-12-12 LAB — URINALYSIS, ROUTINE W REFLEX MICROSCOPIC
Bilirubin Urine: NEGATIVE
Glucose, UA: NEGATIVE mg/dL
Hgb urine dipstick: NEGATIVE
Ketones, ur: NEGATIVE mg/dL
Leukocytes, UA: NEGATIVE
NITRITE: NEGATIVE
PROTEIN: NEGATIVE mg/dL
SPECIFIC GRAVITY, URINE: 1.017 (ref 1.005–1.030)
pH: 6.5 (ref 5.0–8.0)

## 2015-12-12 LAB — TYPE AND SCREEN
ABO/RH(D): A POS
Antibody Screen: NEGATIVE

## 2015-12-12 LAB — APTT: aPTT: 27 seconds (ref 24–36)

## 2015-12-12 LAB — CBC WITH DIFFERENTIAL/PLATELET
BASOS ABS: 0 10*3/uL (ref 0.0–0.1)
BASOS PCT: 1 %
Eosinophils Absolute: 0 10*3/uL (ref 0.0–0.7)
Eosinophils Relative: 1 %
HEMATOCRIT: 38.3 % — AB (ref 39.0–52.0)
HEMOGLOBIN: 12.4 g/dL — AB (ref 13.0–17.0)
Lymphocytes Relative: 28 %
Lymphs Abs: 1.1 10*3/uL (ref 0.7–4.0)
MCH: 33.5 pg (ref 26.0–34.0)
MCHC: 32.4 g/dL (ref 30.0–36.0)
MCV: 103.5 fL — ABNORMAL HIGH (ref 78.0–100.0)
MONOS PCT: 12 %
Monocytes Absolute: 0.5 10*3/uL (ref 0.1–1.0)
NEUTROS ABS: 2.2 10*3/uL (ref 1.7–7.7)
NEUTROS PCT: 58 %
Platelets: 216 10*3/uL (ref 150–400)
RBC: 3.7 MIL/uL — AB (ref 4.22–5.81)
RDW: 16.4 % — ABNORMAL HIGH (ref 11.5–15.5)
WBC: 3.8 10*3/uL — ABNORMAL LOW (ref 4.0–10.5)

## 2015-12-12 LAB — COMPREHENSIVE METABOLIC PANEL
ALBUMIN: 3.7 g/dL (ref 3.5–5.0)
ALK PHOS: 45 U/L (ref 38–126)
ALT: 20 U/L (ref 17–63)
AST: 23 U/L (ref 15–41)
Anion gap: 9 (ref 5–15)
BILIRUBIN TOTAL: 0.3 mg/dL (ref 0.3–1.2)
BUN: 11 mg/dL (ref 6–20)
CALCIUM: 9.3 mg/dL (ref 8.9–10.3)
CO2: 27 mmol/L (ref 22–32)
CREATININE: 0.87 mg/dL (ref 0.61–1.24)
Chloride: 105 mmol/L (ref 101–111)
GFR calc Af Amer: >60 mL/min (ref >60)
GFR calc non Af Amer: >60 mL/min (ref >60)
GLUCOSE: 100 mg/dL — AB (ref 65–99)
Potassium: 4 mmol/L (ref 3.5–5.1)
Sodium: 141 mmol/L (ref 135–145)
TOTAL PROTEIN: 7.7 g/dL (ref 6.5–8.1)

## 2015-12-12 LAB — SURGICAL PCR SCREEN
MRSA, PCR: NEGATIVE
STAPHYLOCOCCUS AUREUS: POSITIVE — AB

## 2015-12-12 LAB — ABO/RH: ABO/RH(D): A POS

## 2015-12-12 LAB — PROTIME-INR
INR: 1.02
Prothrombin Time: 13.4 seconds (ref 11.4–15.2)

## 2015-12-12 NOTE — Progress Notes (Signed)
PCR positive for MSSA. Pt notified and prescription called to pharmacy

## 2015-12-12 NOTE — Progress Notes (Addendum)
PCP: Dr. [name] Cardiologist: Dr. Curt Bears, pt to go for cardiac clearance visit tomorrow and ECHO per patient wife.  Stress test: 11/19/15 Cath 1998 EKG: 11/20/15 CXR: 11/20/15  Pt with recent hospital admit for chest pain, pt states had Stress test that was normal. No issues since.   No complaints of chest pain, SOB or signs of infection per pt report at this time. Pt wife states she was given instructions from Dr. Alvester Morin office to stop aspirin 7 days prior to surgery. Wife states she will ask Dr. Curt Bears at visit tomorrow regarding stopping Aspirin. Message left for [facility] at Dr. Alvester Morin office regarding clarifying with patient when to stop Aspirin.

## 2015-12-12 NOTE — Telephone Encounter (Signed)
Spoke with Judeen Hammans and let her know it is okay to hold ASA until the Eliquis is finished per Dr Curt Bears.

## 2015-12-12 NOTE — Telephone Encounter (Signed)
S[place] calling from Dr. Alvester Morin office-they received a surgical clearance stating ok to stay on asa and beta blocker-however they put patient on Eliquis after surgery for a month, their question is should he take both Eliquis and ASA  or one or the other ?  pls call

## 2015-12-13 ENCOUNTER — Ambulatory Visit (HOSPITAL_BASED_OUTPATIENT_CLINIC_OR_DEPARTMENT_OTHER): Payer: PPO

## 2015-12-13 ENCOUNTER — Other Ambulatory Visit: Payer: Self-pay

## 2015-12-13 ENCOUNTER — Other Ambulatory Visit: Payer: Self-pay | Admitting: Internal Medicine

## 2015-12-13 ENCOUNTER — Encounter (HOSPITAL_COMMUNITY)
Admission: RE | Admit: 2015-12-13 | Discharge: 2015-12-13 | Disposition: A | Discharge status: Home / Self Care | Payer: PPO | Source: Ambulatory Visit | Attending: Orthopedic Surgery | Admitting: Orthopedic Surgery

## 2015-12-13 DIAGNOSIS — Z01818 Encounter for other preprocedural examination: Secondary | ICD-10-CM | POA: Insufficient documentation

## 2015-12-13 DIAGNOSIS — R079 Chest pain, unspecified: Secondary | ICD-10-CM

## 2015-12-13 DIAGNOSIS — I2510 Atherosclerotic heart disease of native coronary artery without angina pectoris: Secondary | ICD-10-CM | POA: Insufficient documentation

## 2015-12-13 DIAGNOSIS — Z9481 Bone marrow transplant status: Secondary | ICD-10-CM | POA: Insufficient documentation

## 2015-12-13 DIAGNOSIS — I10 Essential (primary) hypertension: Secondary | ICD-10-CM | POA: Insufficient documentation

## 2015-12-13 DIAGNOSIS — E349 Endocrine disorder, unspecified: Secondary | ICD-10-CM | POA: Insufficient documentation

## 2015-12-13 DIAGNOSIS — M1990 Unspecified osteoarthritis, unspecified site: Secondary | ICD-10-CM | POA: Insufficient documentation

## 2015-12-13 DIAGNOSIS — I071 Rheumatic tricuspid insufficiency: Secondary | ICD-10-CM | POA: Insufficient documentation

## 2015-12-13 DIAGNOSIS — R0989 Other specified symptoms and signs involving the circulatory and respiratory systems: Secondary | ICD-10-CM | POA: Insufficient documentation

## 2015-12-13 DIAGNOSIS — Z01812 Encounter for preprocedural laboratory examination: Secondary | ICD-10-CM | POA: Insufficient documentation

## 2015-12-13 DIAGNOSIS — E785 Hyperlipidemia, unspecified: Secondary | ICD-10-CM | POA: Insufficient documentation

## 2015-12-13 DIAGNOSIS — I340 Nonrheumatic mitral (valve) insufficiency: Secondary | ICD-10-CM | POA: Insufficient documentation

## 2015-12-13 DIAGNOSIS — M546 Pain in thoracic spine: Secondary | ICD-10-CM | POA: Insufficient documentation

## 2015-12-13 HISTORY — DX: Cardiac murmur, unspecified: R01.1

## 2015-12-13 HISTORY — DX: Headache: R51

## 2015-12-13 HISTORY — DX: Disease of blood and blood-forming organs, unspecified: D75.9

## 2015-12-13 HISTORY — DX: Headache, unspecified: R51.9

## 2015-12-13 LAB — URINE CULTURE

## 2015-12-13 MED ORDER — HYDROCORTISONE ACE-PRAMOXINE 2.5-1 % RE CREA: 1.0000 "application " | TOPICAL_CREAM | Freq: Every day | RECTAL | 0 refills | Status: DC | PRN

## 2015-12-13 NOTE — Telephone Encounter (Signed)
Rx sent 

## 2015-12-13 NOTE — Progress Notes (Signed)
Anesthesia Chart Review:  Pt is a 74 year old male scheduled for R total knee arthroplasty on 12/23/2015 with Earlie Server, MD.   - Cardiologist is Allegra Lai, MD.  - PCP is [name], MD - Oncologist is Burney Gauze, MD who is aware of upcoming surgery.   PMH includes:  CAD (by notes, cath 1998 with single vessel disease, medically managed), HTN, hyperlipidemia, multiple myeloma, GERD. Former smoker. BMI 35  Medications include: ASA, ixazomib, metoprolol, protonix, pomalyst (on hold for surgery).   Preoperative labs reviewed.    CT angio chest 11/18/15:  1. No pulmonary embolism. 2. Mild scarring versus atelectasis in the mid to lower lungs bilaterally. Otherwise no active pulmonary disease. 3. Aortic atherosclerosis. Left main and 3 vessel coronary atherosclerosis. 4. Expansile lytic lateral right second rib lesion, stable since 11/18/2014 PET-CT study, consistent with the history of multiple myeloma. No new bone lesions in the chest.  CXR 11/18/15: No active cardiopulmonary disease.  EKG 11/20/15: NSR.   Echo 12/13/15:  - Left ventricle: The cavity size was normal. There was mild concentric hypertrophy. Systolic function was normal. The estimated ejection fraction was in the range of 60% to 65%. Wall motion was normal; there were no regional wall motion abnormalities. Doppler parameters are consistent with abnormal left ventricular relaxation (grade 1 diastolic dysfunction). Doppler parameters are consistent with indeterminate ventricular filling pressure. - Aortic valve: Transvalvular velocity was within the normal range. There was no stenosis. There was no regurgitation. - Mitral valve: Calcified annulus. Transvalvular velocity was within the normal range. There was no evidence for stenosis. There was mild regurgitation. - Right ventricle: The cavity size was normal. Wall thickness was normal. Systolic function was normal. - Atrial septum: A patent foramen ovale cannot be  excluded. - Tricuspid valve: There was mild regurgitation. - Pulmonary arteries: Systolic pressure was within the normal range. PA peak pressure: 28 mm Hg (S).  Nuclear stress test 11/19/15:  1. No reversible ischemia or infarction. 2. Normal left ventricular wall motion. 3. Left ventricular ejection fraction 67% 4. Non invasive risk stratification: Low  Pt saw Dr. Curt Bears for cardiology clearance for surgery 10/31/15, was cleared for surgery and instructed to f/u prn. On 11/18/15, pt was hospitalized for chest pain, stress test performed, results above.   If no changes, I anticipate pt can proceed with surgery as scheduled.   Willeen Cass, FNP-BC [facility]/Anesthesiology Phone: [phone] 12/13/2015 2:57 PM

## 2015-12-13 NOTE — Telephone Encounter (Signed)
Please advise 

## 2015-12-13 NOTE — Telephone Encounter (Signed)
Okay to refill 1, advise patient that his symptoms are severe (pain, fever, bleeding) he needs to be seen.

## 2015-12-13 NOTE — Telephone Encounter (Signed)
Caller name:Negro,Florence Marie Relation to [id number]  Call back [phone] Pharmacy: [facility], [facility] [phone] (Phone) [phone] (Fa     Reason for call:  Spouse requesting hydrocortisone-pramoxine (ANALPRAM HC) 2.5-1 % rectal cream due to patient experiencing hemorrhoid flare up, declined appointment stated PCP is aware, please advise.

## 2015-12-14 ENCOUNTER — Telehealth: Payer: Self-pay | Admitting: Cardiology

## 2015-12-14 MED ORDER — LIDOCAINE 5 % EX OINT: 1.0000 "application " | TOPICAL_OINTMENT | Freq: Two times a day (BID) | CUTANEOUS | 1 refills | Status: DC | PRN

## 2015-12-14 NOTE — Addendum Note (Signed)
Addended byDamita Dunnings D on: 12/14/2015 12:43 PM   Modules accepted: Orders

## 2015-12-14 NOTE — Telephone Encounter (Signed)
Spouse states Rx is $200 requesting an alternate that may cost much cheaper, please advise.

## 2015-12-14 NOTE — Telephone Encounter (Signed)
Pt wife is calling stating she is returning a call doesn't know who called and what it was for

## 2015-12-14 NOTE — Telephone Encounter (Signed)
Please advise 

## 2015-12-14 NOTE — Addendum Note (Signed)
Addended by: [name] E on: 12/14/2015 12:38 PM   Modules accepted: Orders

## 2015-12-14 NOTE — Telephone Encounter (Signed)
Rx sent 

## 2015-12-14 NOTE — Telephone Encounter (Signed)
A prescription for lidocaine ointment is ready to be sent. Recommend to use that mixed  with OTC hydrocortisone 1%.

## 2015-12-19 NOTE — Telephone Encounter (Signed)
Wife received echo results

## 2015-12-22 MED ORDER — TRANEXAMIC ACID 1000 MG/10ML IV SOLN
1000.0000 mg | INTRAVENOUS | Status: AC
  Administered 2015-12-23: 1000 mg via INTRAVENOUS
  Filled 2015-12-22: qty 10

## 2015-12-23 ENCOUNTER — Encounter (HOSPITAL_COMMUNITY): Payer: Self-pay | Admitting: Anesthesiology

## 2015-12-23 ENCOUNTER — Inpatient Hospital Stay (HOSPITAL_COMMUNITY): Payer: PPO | Admitting: Emergency Medicine

## 2015-12-23 ENCOUNTER — Inpatient Hospital Stay (HOSPITAL_COMMUNITY): Payer: PPO | Admitting: Anesthesiology

## 2015-12-23 ENCOUNTER — Encounter (HOSPITAL_COMMUNITY)
Admission: RE | Disposition: A | Discharge status: Home Health | Payer: Self-pay | Source: Ambulatory Visit | Attending: Orthopedic Surgery

## 2015-12-23 ENCOUNTER — Inpatient Hospital Stay (HOSPITAL_COMMUNITY)
Admission: RE | Admit: 2015-12-23 | Discharge: 2015-12-26 | DRG: 470 | Disposition: A | Discharge status: Home Health | Payer: PPO | Source: Ambulatory Visit | Attending: Orthopedic Surgery | Admitting: Orthopedic Surgery

## 2015-12-23 DIAGNOSIS — Z9481 Bone marrow transplant status: Secondary | ICD-10-CM

## 2015-12-23 DIAGNOSIS — I2510 Atherosclerotic heart disease of native coronary artery without angina pectoris: Secondary | ICD-10-CM | POA: Diagnosis present

## 2015-12-23 DIAGNOSIS — Z981 Arthrodesis status: Secondary | ICD-10-CM

## 2015-12-23 DIAGNOSIS — Z8711 Personal history of peptic ulcer disease: Secondary | ICD-10-CM

## 2015-12-23 DIAGNOSIS — G8918 Other acute postprocedural pain: Secondary | ICD-10-CM | POA: Diagnosis not present

## 2015-12-23 DIAGNOSIS — E785 Hyperlipidemia, unspecified: Secondary | ICD-10-CM | POA: Diagnosis not present

## 2015-12-23 DIAGNOSIS — Z87891 Personal history of nicotine dependence: Secondary | ICD-10-CM

## 2015-12-23 DIAGNOSIS — K219 Gastro-esophageal reflux disease without esophagitis: Secondary | ICD-10-CM | POA: Diagnosis not present

## 2015-12-23 DIAGNOSIS — I10 Essential (primary) hypertension: Secondary | ICD-10-CM | POA: Diagnosis not present

## 2015-12-23 DIAGNOSIS — C9000 Multiple myeloma not having achieved remission: Secondary | ICD-10-CM | POA: Diagnosis present

## 2015-12-23 DIAGNOSIS — M1711 Unilateral primary osteoarthritis, right knee: Secondary | ICD-10-CM | POA: Diagnosis not present

## 2015-12-23 HISTORY — PX: TOTAL KNEE ARTHROPLASTY: SHX125

## 2015-12-23 SURGERY — ARTHROPLASTY, KNEE, TOTAL
Anesthesia: Spinal | Site: Knee | Laterality: Right

## 2015-12-23 MED ORDER — FENTANYL CITRATE (PF) 100 MCG/2ML IJ SOLN
INTRAMUSCULAR | Status: AC
  Filled 2015-12-23: qty 2

## 2015-12-23 MED ORDER — PROPOFOL 1000 MG/100ML IV EMUL
INTRAVENOUS | Status: AC
  Filled 2015-12-23: qty 200

## 2015-12-23 MED ORDER — METOCLOPRAMIDE HCL 5 MG/ML IJ SOLN: 5.0000 mg | Freq: Three times a day (TID) | INTRAMUSCULAR | Status: DC | PRN

## 2015-12-23 MED ORDER — ACETAMINOPHEN 650 MG RE SUPP: 650.0000 mg | Freq: Four times a day (QID) | RECTAL | Status: DC | PRN

## 2015-12-23 MED ORDER — PHENYLEPHRINE 40 MCG/ML (10ML) SYRINGE FOR IV PUSH (FOR BLOOD PRESSURE SUPPORT)
PREFILLED_SYRINGE | INTRAVENOUS | Status: AC
  Filled 2015-12-23: qty 10

## 2015-12-23 MED ORDER — BUPIVACAINE LIPOSOME 1.3 % IJ SUSP
20.0000 mL | INTRAMUSCULAR | Status: DC
  Filled 2015-12-23: qty 20

## 2015-12-23 MED ORDER — 0.9 % SODIUM CHLORIDE (POUR BTL) OPTIME
TOPICAL | Status: DC | PRN
  Administered 2015-12-23: 1000 mL

## 2015-12-23 MED ORDER — SORBITOL 70 % SOLN: 30.0000 mL | Freq: Every day | Status: DC | PRN

## 2015-12-23 MED ORDER — ACETAMINOPHEN 325 MG PO TABS: 650.0000 mg | ORAL_TABLET | Freq: Four times a day (QID) | ORAL | Status: DC | PRN

## 2015-12-23 MED ORDER — BUPIVACAINE-EPINEPHRINE (PF) 0.25% -1:200000 IJ SOLN
INTRAMUSCULAR | Status: AC
  Filled 2015-12-23: qty 30

## 2015-12-23 MED ORDER — SODIUM CHLORIDE 0.9 % IR SOLN
Status: DC | PRN
  Administered 2015-12-23: 3000 mL

## 2015-12-23 MED ORDER — HYDROMORPHONE HCL 2 MG/ML IJ SOLN
0.5000 mg | INTRAMUSCULAR | Status: DC | PRN
  Administered 2015-12-23 – 2015-12-25 (×9): 0.5 mg via INTRAVENOUS
  Filled 2015-12-23 (×9): qty 1

## 2015-12-23 MED ORDER — PHENOL 1.4 % MT LIQD: 1.0000 | OROMUCOSAL | Status: DC | PRN

## 2015-12-23 MED ORDER — LACTATED RINGERS IV SOLN
INTRAVENOUS | Status: DC
  Administered 2015-12-23 (×2): via INTRAVENOUS

## 2015-12-23 MED ORDER — APIXABAN 2.5 MG PO TABS
2.5000 mg | ORAL_TABLET | Freq: Two times a day (BID) | ORAL | Status: DC
  Administered 2015-12-24 – 2015-12-26 (×5): 2.5 mg via ORAL
  Filled 2015-12-23 (×5): qty 1

## 2015-12-23 MED ORDER — FLEET ENEMA 7-19 GM/118ML RE ENEM: 1.0000 | ENEMA | Freq: Once | RECTAL | Status: DC | PRN

## 2015-12-23 MED ORDER — SODIUM CHLORIDE 0.9 % IV SOLN: INTRAVENOUS | Status: DC

## 2015-12-23 MED ORDER — METOPROLOL TARTRATE 50 MG PO TABS
50.0000 mg | ORAL_TABLET | Freq: Two times a day (BID) | ORAL | Status: DC
  Administered 2015-12-23 – 2015-12-26 (×6): 50 mg via ORAL
  Filled 2015-12-23 (×6): qty 1

## 2015-12-23 MED ORDER — ONDANSETRON HCL 4 MG/2ML IJ SOLN: 4.0000 mg | Freq: Four times a day (QID) | INTRAMUSCULAR | Status: DC | PRN

## 2015-12-23 MED ORDER — HYDROCODONE-ACETAMINOPHEN 5-325 MG PO TABS
1.0000 | ORAL_TABLET | ORAL | Status: DC | PRN
  Administered 2015-12-23 – 2015-12-24 (×6): 2 via ORAL
  Filled 2015-12-23 (×6): qty 2

## 2015-12-23 MED ORDER — BUPIVACAINE-EPINEPHRINE (PF) 0.25% -1:200000 IJ SOLN
INTRAMUSCULAR | Status: DC | PRN
  Administered 2015-12-23: 50 mL

## 2015-12-23 MED ORDER — FENTANYL CITRATE (PF) 100 MCG/2ML IJ SOLN
INTRAMUSCULAR | Status: DC | PRN
  Administered 2015-12-23 (×2): 50 ug via INTRAVENOUS

## 2015-12-23 MED ORDER — TRANEXAMIC ACID 1000 MG/10ML IV SOLN
2000.0000 mg | INTRAVENOUS | Status: DC
  Filled 2015-12-23: qty 20

## 2015-12-23 MED ORDER — PHENYLEPHRINE HCL 10 MG/ML IJ SOLN
INTRAMUSCULAR | Status: DC | PRN
  Administered 2015-12-23 (×3): 40 ug via INTRAVENOUS
  Administered 2015-12-23: 80 ug via INTRAVENOUS
  Administered 2015-12-23: 120 ug via INTRAVENOUS
  Administered 2015-12-23: 80 ug via INTRAVENOUS

## 2015-12-23 MED ORDER — TRANEXAMIC ACID 1000 MG/10ML IV SOLN
1000.0000 mg | Freq: Once | INTRAVENOUS | Status: AC
  Administered 2015-12-23: 1000 mg via INTRAVENOUS
  Filled 2015-12-23: qty 10

## 2015-12-23 MED ORDER — LIDOCAINE 2% (20 MG/ML) 5 ML SYRINGE
INTRAMUSCULAR | Status: AC
  Filled 2015-12-23: qty 5

## 2015-12-23 MED ORDER — ONDANSETRON HCL 4 MG PO TABS
4.0000 mg | ORAL_TABLET | Freq: Four times a day (QID) | ORAL | Status: DC | PRN
  Administered 2015-12-25: 4 mg via ORAL
  Filled 2015-12-23: qty 1

## 2015-12-23 MED ORDER — SODIUM CHLORIDE 0.9% FLUSH
INTRAVENOUS | Status: DC | PRN
  Administered 2015-12-23: 50 mL

## 2015-12-23 MED ORDER — APIXABAN 2.5 MG PO TABS: 2.5000 mg | ORAL_TABLET | Freq: Two times a day (BID) | ORAL | 0 refills | Status: DC

## 2015-12-23 MED ORDER — VANCOMYCIN HCL IN DEXTROSE 1-5 GM/200ML-% IV SOLN
1000.0000 mg | Freq: Once | INTRAVENOUS | Status: AC
  Administered 2015-12-23: 1000 mg via INTRAVENOUS
  Filled 2015-12-23: qty 200

## 2015-12-23 MED ORDER — DEXTROSE 5 % IV SOLN
INTRAVENOUS | Status: DC | PRN
  Administered 2015-12-23: 20 ug/min via INTRAVENOUS

## 2015-12-23 MED ORDER — TRAMADOL HCL 50 MG PO TABS: 100.0000 mg | ORAL_TABLET | Freq: Four times a day (QID) | ORAL | 0 refills | Status: DC | PRN

## 2015-12-23 MED ORDER — BUPIVACAINE LIPOSOME 1.3 % IJ SUSP
INTRAMUSCULAR | Status: DC | PRN
  Administered 2015-12-23: 20 mL

## 2015-12-23 MED ORDER — MENTHOL 3 MG MT LOZG: 1.0000 | LOZENGE | OROMUCOSAL | Status: DC | PRN

## 2015-12-23 MED ORDER — MIDAZOLAM HCL 2 MG/2ML IJ SOLN
INTRAMUSCULAR | Status: AC
  Filled 2015-12-23: qty 2

## 2015-12-23 MED ORDER — TRANEXAMIC ACID 1000 MG/10ML IV SOLN
INTRAVENOUS | Status: DC | PRN
  Administered 2015-12-23: 2000 mg via TOPICAL

## 2015-12-23 MED ORDER — POLYETHYLENE GLYCOL 3350 17 G PO PACK
17.0000 g | PACK | Freq: Every day | ORAL | Status: DC | PRN
  Administered 2015-12-26: 17 g via ORAL
  Filled 2015-12-23: qty 1

## 2015-12-23 MED ORDER — BUPIVACAINE IN DEXTROSE 0.75-8.25 % IT SOLN
INTRATHECAL | Status: DC | PRN
  Administered 2015-12-23: 2 mL via INTRATHECAL

## 2015-12-23 MED ORDER — PROBIOTIC PO CAPS: ORAL_CAPSULE | Freq: Every day | ORAL | Status: DC

## 2015-12-23 MED ORDER — DOCUSATE SODIUM 100 MG PO CAPS
100.0000 mg | ORAL_CAPSULE | Freq: Two times a day (BID) | ORAL | Status: DC
  Administered 2015-12-23 – 2015-12-26 (×6): 100 mg via ORAL
  Filled 2015-12-23 (×6): qty 1

## 2015-12-23 MED ORDER — METOCLOPRAMIDE HCL 5 MG PO TABS: 5.0000 mg | ORAL_TABLET | Freq: Three times a day (TID) | ORAL | Status: DC | PRN

## 2015-12-23 MED ORDER — ONDANSETRON HCL 4 MG/2ML IJ SOLN
INTRAMUSCULAR | Status: DC | PRN
  Administered 2015-12-23: 4 mg via INTRAVENOUS

## 2015-12-23 MED ORDER — PROPOFOL 10 MG/ML IV BOLUS
INTRAVENOUS | Status: DC | PRN
  Administered 2015-12-23 (×2): 20 mg via INTRAVENOUS

## 2015-12-23 MED ORDER — SODIUM CHLORIDE 0.9 % IV SOLN
INTRAVENOUS | Status: DC
  Administered 2015-12-23: 18:00:00 via INTRAVENOUS

## 2015-12-23 MED ORDER — ONDANSETRON HCL 4 MG/2ML IJ SOLN
INTRAMUSCULAR | Status: AC
  Filled 2015-12-23: qty 2

## 2015-12-23 MED ORDER — DEXAMETHASONE SODIUM PHOSPHATE 10 MG/ML IJ SOLN
INTRAMUSCULAR | Status: DC | PRN
  Administered 2015-12-23: 10 mg via INTRAVENOUS

## 2015-12-23 MED ORDER — LIDOCAINE HCL (CARDIAC) 20 MG/ML IV SOLN
INTRAVENOUS | Status: DC | PRN
  Administered 2015-12-23: 50 mg via INTRAVENOUS

## 2015-12-23 MED ORDER — VANCOMYCIN HCL IN DEXTROSE 1-5 GM/200ML-% IV SOLN
1000.0000 mg | Freq: Two times a day (BID) | INTRAVENOUS | Status: AC
  Administered 2015-12-23: 1000 mg via INTRAVENOUS
  Filled 2015-12-23: qty 200

## 2015-12-23 MED ORDER — PROPOFOL 500 MG/50ML IV EMUL
INTRAVENOUS | Status: DC | PRN
  Administered 2015-12-23: 50 ug/kg/min via INTRAVENOUS

## 2015-12-23 MED ORDER — PANTOPRAZOLE SODIUM 40 MG PO TBEC
40.0000 mg | DELAYED_RELEASE_TABLET | Freq: Every day | ORAL | Status: DC
  Administered 2015-12-23 – 2015-12-26 (×4): 40 mg via ORAL
  Filled 2015-12-23 (×4): qty 1

## 2015-12-23 MED ORDER — CYCLOBENZAPRINE HCL 10 MG PO TABS
5.0000 mg | ORAL_TABLET | Freq: Three times a day (TID) | ORAL | Status: DC | PRN
  Administered 2015-12-23 – 2015-12-25 (×5): 5 mg via ORAL
  Filled 2015-12-23 (×5): qty 1

## 2015-12-23 MED ORDER — VITAMIN D3 25 MCG (1000 UNIT) PO TABS
2000.0000 [IU] | ORAL_TABLET | Freq: Every day | ORAL | Status: DC
  Administered 2015-12-23 – 2015-12-25 (×3): 2000 [IU] via ORAL
  Filled 2015-12-23 (×7): qty 2

## 2015-12-23 MED ORDER — HYDROCODONE-ACETAMINOPHEN 5-325 MG PO TABS: 1.0000 | ORAL_TABLET | ORAL | 0 refills | Status: DC | PRN

## 2015-12-23 MED ORDER — CHLORHEXIDINE GLUCONATE 4 % EX LIQD: 60.0000 mL | Freq: Once | CUTANEOUS | Status: DC

## 2015-12-23 MED ORDER — TRAMADOL HCL 50 MG PO TABS
100.0000 mg | ORAL_TABLET | Freq: Four times a day (QID) | ORAL | Status: DC | PRN
  Filled 2015-12-23: qty 2

## 2015-12-23 MED ORDER — MIDAZOLAM HCL 5 MG/5ML IJ SOLN
INTRAMUSCULAR | Status: DC | PRN
  Administered 2015-12-23 (×2): 1 mg via INTRAVENOUS

## 2015-12-23 SURGICAL SUPPLY — 71 items
ADH SKN CLS LQ APL DERMABOND (GAUZE/BANDAGES/DRESSINGS) ×1
BAG DECANTER FOR FLEXI CONT (MISCELLANEOUS) ×2 IMPLANT
BANDAGE ACE 4X5 VEL STRL LF (GAUZE/BANDAGES/DRESSINGS) ×3 IMPLANT
BANDAGE ACE 6X5 VEL STRL LF (GAUZE/BANDAGES/DRESSINGS) ×3 IMPLANT
BANDAGE ELASTIC 4 VELCRO ST LF (GAUZE/BANDAGES/DRESSINGS) ×2 IMPLANT
BANDAGE ELASTIC 6 VELCRO ST LF (GAUZE/BANDAGES/DRESSINGS) ×2 IMPLANT
BANDAGE ESMARK 6X9 LF (GAUZE/BANDAGES/DRESSINGS) ×1 IMPLANT
BLADE SAGITTAL 25.0X1.19X90 (BLADE) ×2 IMPLANT
BLADE SAGITTAL 25.0X1.19X90MM (BLADE) ×1
BLADE SAW SAG 90X13X1.27 (BLADE) ×3 IMPLANT
BLADE SURG 10 STRL SS (BLADE) ×2 IMPLANT
BNDG CMPR 9X6 STRL LF SNTH (GAUZE/BANDAGES/DRESSINGS) ×1
BNDG ESMARK 6X9 LF (GAUZE/BANDAGES/DRESSINGS) ×3
BONE CEMENT GENTAMICIN (Cement) ×3 IMPLANT
BOWL SMART MIX CTS (DISPOSABLE) ×3 IMPLANT
CAP KNEE TOTAL 3 SIGMA ×2 IMPLANT
CEMENT BONE GENTAMICIN 40 (Cement) IMPLANT
COVER SURGICAL LIGHT HANDLE (MISCELLANEOUS) ×3 IMPLANT
CUFF TOURNIQUET SINGLE 34IN LL (TOURNIQUET CUFF) ×3 IMPLANT
DECANTER SPIKE VIAL GLASS SM (MISCELLANEOUS) ×2 IMPLANT
DERMABOND ADHESIVE PROPEN (GAUZE/BANDAGES/DRESSINGS) ×2
DERMABOND ADVANCED .7 DNX6 (GAUZE/BANDAGES/DRESSINGS) IMPLANT
DRAPE ORTHO SPLIT 77X108 STRL (DRAPES) ×6
DRAPE SURG ORHT 6 SPLT 77X108 (DRAPES) ×2 IMPLANT
DRAPE U-SHAPE 47X51 STRL (DRAPES) ×3 IMPLANT
DRSG ADAPTIC 3X8 NADH LF (GAUZE/BANDAGES/DRESSINGS) ×3 IMPLANT
DRSG PAD ABDOMINAL 8X10 ST (GAUZE/BANDAGES/DRESSINGS) ×4 IMPLANT
DURAPREP 26ML APPLICATOR (WOUND CARE) ×3 IMPLANT
ELECT REM PT RETURN 9FT ADLT (ELECTROSURGICAL) ×3
ELECTRODE REM PT RTRN 9FT ADLT (ELECTROSURGICAL) ×1 IMPLANT
FACESHIELD WRAPAROUND (MASK) ×6 IMPLANT
FACESHIELD WRAPAROUND OR TEAM (MASK) ×2 IMPLANT
GAUZE SPONGE 4X4 12PLY STRL (GAUZE/BANDAGES/DRESSINGS) ×3 IMPLANT
GLOVE BIOGEL PI IND STRL 8 (GLOVE) ×4 IMPLANT
GLOVE BIOGEL PI INDICATOR 8 (GLOVE) ×8
GLOVE ORTHO TXT STRL SZ7.5 (GLOVE) ×3 IMPLANT
GLOVE SURG ORTHO 8.0 STRL STRW (GLOVE) ×3 IMPLANT
GOWN STRL REUS W/ TWL LRG LVL3 (GOWN DISPOSABLE) ×2 IMPLANT
GOWN STRL REUS W/ TWL XL LVL3 (GOWN DISPOSABLE) ×1 IMPLANT
GOWN STRL REUS W/TWL 2XL LVL3 (GOWN DISPOSABLE) ×3 IMPLANT
GOWN STRL REUS W/TWL LRG LVL3 (GOWN DISPOSABLE) ×6
GOWN STRL REUS W/TWL XL LVL3 (GOWN DISPOSABLE) ×3
HANDPIECE INTERPULSE COAX TIP (DISPOSABLE) ×3
HOOD PEEL AWAY FACE SHEILD DIS (HOOD) ×3 IMPLANT
IMMOBILIZER KNEE 22 UNIV (SOFTGOODS) ×2 IMPLANT
KIT BASIN OR (CUSTOM PROCEDURE TRAY) ×3 IMPLANT
KIT ROOM TURNOVER OR (KITS) ×3 IMPLANT
MANIFOLD NEPTUNE II (INSTRUMENTS) ×3 IMPLANT
NEEDLE 22X1 1/2 (OR ONLY) (NEEDLE) ×6 IMPLANT
NS IRRIG 1000ML POUR BTL (IV SOLUTION) ×3 IMPLANT
PACK TOTAL JOINT (CUSTOM PROCEDURE TRAY) ×3 IMPLANT
PAD ARMBOARD 7.5X6 YLW CONV (MISCELLANEOUS) ×6 IMPLANT
PAD CAST 4YDX4 CTTN HI CHSV (CAST SUPPLIES) ×1 IMPLANT
PADDING CAST COTTON 4X4 STRL (CAST SUPPLIES) ×3
PADDING CAST COTTON 6X4 STRL (CAST SUPPLIES) ×3 IMPLANT
SET HNDPC FAN SPRY TIP SCT (DISPOSABLE) ×1 IMPLANT
SPONGE GAUZE 4X4 12PLY STER LF (GAUZE/BANDAGES/DRESSINGS) ×2 IMPLANT
STAPLER VISISTAT 35W (STAPLE) ×3 IMPLANT
SUCTION FRAZIER HANDLE 10FR (MISCELLANEOUS) ×2
SUCTION TUBE FRAZIER 10FR DISP (MISCELLANEOUS) ×1 IMPLANT
SUT ETHIBOND NAB CT1 #1 30IN (SUTURE) ×9 IMPLANT
SUT VIC AB 0 CT1 27 (SUTURE) ×3
SUT VIC AB 0 CT1 27XBRD ANBCTR (SUTURE) ×1 IMPLANT
SUT VIC AB 2-0 CT1 27 (SUTURE) ×6
SUT VIC AB 2-0 CT1 TAPERPNT 27 (SUTURE) ×2 IMPLANT
SYR CONTROL 10ML LL (SYRINGE) ×6 IMPLANT
TOWEL OR 17X24 6PK STRL BLUE (TOWEL DISPOSABLE) ×3 IMPLANT
TOWEL OR 17X26 10 PK STRL BLUE (TOWEL DISPOSABLE) ×3 IMPLANT
TRAY CATH 16FR W/PLASTIC CATH (SET/KITS/TRAYS/PACK) IMPLANT
TRAY FOLEY CATH 16FRSI W/METER (SET/KITS/TRAYS/PACK) IMPLANT
WATER STERILE IRR 1000ML POUR (IV SOLUTION) ×3 IMPLANT

## 2015-12-23 NOTE — Anesthesia Postprocedure Evaluation (Signed)
Anesthesia Post Note  Patient: Gene Hill  Procedure(s) Performed: Procedure(s) (LRB): TOTAL KNEE ARTHROPLASTY (Right)  Patient location during evaluation: PACU Anesthesia Type: Spinal Level of consciousness: oriented and awake and alert Pain management: pain level controlled Vital Signs Assessment: post-procedure vital signs reviewed and stable Respiratory status: spontaneous breathing, respiratory function stable and patient connected to nasal cannula oxygen Cardiovascular status: blood pressure returned to baseline and stable Postop Assessment: no headache, no backache and spinal receding Anesthetic complications: no    Last Vitals:  Vitals:   12/23/15 1334 12/23/15 1400  BP: 115/67   Pulse: (!) 57 66  Resp: 10 17  Temp:      Last Pain:  Vitals:   12/23/15 0844  TempSrc: Oral                 Effie Berkshire

## 2015-12-23 NOTE — Interval H&P Note (Signed)
History and Physical Interval Note:  12/23/2015 9:28 AM  Gene Hill  has presented today for surgery, with the diagnosis of OA RIGHT KNEE  The various methods of treatment have been discussed with the patient and family. After consideration of risks, benefits and other options for treatment, the patient has consented to  Procedure(s): TOTAL KNEE ARTHROPLASTY (Right) as a surgical intervention .  The patient's history has been reviewed, patient examined, no change in status, stable for surgery.  I have reviewed the patient's chart and labs.  Questions were answered to the patient's satisfaction.     Brittlyn Cloe JR,W D

## 2015-12-23 NOTE — H&P (View-Only) (Signed)
TOTAL KNEE ADMISSION H&P  Patient is being admitted for right total knee arthroplasty.  Subjective:  Chief Complaint:right knee pain.  HPI: Gene Hill, 75 y.o. male, has a history of pain and functional disability in the right knee due to arthritis and has failed non-surgical conservative treatments for greater than 12 weeks to includeNSAID's and/or analgesics, corticosteriod injections, activity modification and scope, lateral release.  Onset of symptoms was gradual, starting >10 years ago with gradually worsening course since that time. The patient noted prior procedures on the knee to include  arthroscopy and lateral release on the right knee(s).  Patient currently rates pain in the right knee(s) at 8 out of 10 with activity. Patient has night pain, worsening of pain with activity and weight bearing, pain that interferes with activities of daily living, pain with passive range of motion, crepitus and joint swelling.  Patient has evidence of periarticular osteophytes and joint space narrowing by imaging studies.There is no active infection.  Patient Active Problem List   Diagnosis Date Noted  . Chest pain at rest 11/18/2015  . Chest pain 11/18/2015  . Precordial pain   . Acute left-sided thoracic back pain   . Atherosclerosis of native coronary artery of native heart without angina pectoris   . PCP NOTES >>>>>>>>>>>>>>>>>>>>>>>> 08/22/2015  . Arthritis-- aches, pain 02/05/2014  . Dizziness 02/05/2014  . 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 Annual physical exam 08/14/2010  . 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 Multiple myeloma not having achieved remission ([facility]) 04/15/2009  . CAROTID BRUIT 04/14/2009  . 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 DIVERTICULOSIS, COLON 08/13/2008  . Backache 07/22/2006  . FATIGUE 07/22/2006  . Hyperlipidemia 05/03/2006  . Essential hypertension 05/03/2006  . Coronary atherosclerosis 05/03/2006   . COLONOSCOPY WITH BIOPSY, HX OF 09/22/2002   Past Medical History:  Diagnosis Date  . Arthritis   . 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 Blood transfusion   . Carotid bruit    3-11 carotid u/s was  (-)  . 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 GERD (gastroesophageal reflux disease)   . HYPERLIPIDEMIA 05/03/2006  . HYPERTENSION 05/03/2006  . 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 PEPTIC ULCER DISEASE 07/22/2008   per EGD 6-10 .Marland Kitchen ulcer duodenitis     Past Surgical History:  Procedure Laterality Date  . CERVICAL FUSION    . Victoria   right  . ROTATOR CUFF REPAIR     right  . SPINAL FUSION       (Not in a hospital admission) No Known Allergies  Social History  Substance Use Topics  . Smoking status: Former Smoker    Packs/day: 0.25    Years: 20.00    Types: Cigarettes    Start date: 04/28/1954    Quit date: 11/28/1974  . Smokeless tobacco: Never Used     Comment: quit 35 years ago, 1976  . Alcohol use 0.0 oz/week     Comment: Rarely     Family History  Problem Relation Age of Onset  . Kidney cancer Father   . Leukemia Mother   . Pulmonary embolism Brother   . Stroke Maternal Grandmother   . Heart attack Maternal Grandfather   . Stroke Paternal Grandmother   . Cerebral palsy Son   . Prostate cancer Neg Hx   .  Colon cancer Neg Hx   . Esophageal cancer Neg Hx   . Rectal cancer Neg Hx   . Stomach cancer Neg Hx      Review of Systems  HENT: Positive for hearing loss and tinnitus.   Musculoskeletal: Positive for joint pain.  All other systems reviewed and are negative.   Objective:  Physical Exam  Constitutional: He is oriented to person, place, and time. He appears well-developed and well-nourished. No distress.  HENT:  Head: Normocephalic and atraumatic.  Nose: Nose normal.  Eyes: Conjunctivae and EOM are normal. Pupils are  equal, round, and reactive to light.  Neck: Normal range of motion. Neck supple.  Cardiovascular: Normal rate, regular rhythm, normal heart sounds and intact distal pulses.   Respiratory: Effort normal and breath sounds normal. No respiratory distress. He has no wheezes.  GI: Soft. Bowel sounds are normal. He exhibits no distension. There is no tenderness.  Musculoskeletal:       Right knee: He exhibits swelling. He exhibits normal range of motion and no erythema. Tenderness found.  Lymphadenopathy:    He has no cervical adenopathy.  Neurological: He is alert and oriented to person, place, and time. No cranial nerve deficit.  Skin: Skin is warm and dry. No rash noted. No erythema.  Psychiatric: He has a normal mood and affect. His behavior is normal.    Vital signs in last 24 hours: _0 @  Labs:   Estimated body mass index is 33.83 kg/m as calculated from the following:   Height as of 11/18/15: _1  (1.702 m).   Weight as of 11/23/15: 98 kg (216 lb).   Imaging Review Plain radiographs demonstrate moderate degenerative joint disease of the right knee(s). The overall alignment ismild varus. The bone quality appears to be good for age and reported activity level.  Assessment/Plan:  End stage arthritis, right knee   The patient history, physical examination, clinical judgment of the provider and imaging studies are consistent with end stage degenerative joint disease of the right knee(s) and total knee arthroplasty is deemed medically necessary. The treatment options including medical management, injection therapy arthroscopy and arthroplasty were discussed at length. The risks and benefits of total knee arthroplasty were presented and reviewed. The risks due to aseptic loosening, infection, stiffness, patella tracking problems, thromboembolic complications and other imponderables were discussed. The patient acknowledged the explanation, agreed to proceed with the plan and consent  was signed. Patient is being admitted for inpatient treatment for surgery, pain control, PT, OT, prophylactic antibiotics, VTE prophylaxis, progressive ambulation and ADL's and discharge planning. The patient is planning to be discharged home with home health services

## 2015-12-23 NOTE — Anesthesia Preprocedure Evaluation (Addendum)
Anesthesia Evaluation  Patient identified by MRN, date of birth, ID band Patient awake    Reviewed: Allergy & Precautions, NPO status , Patient's Chart, lab work & pertinent test results, reviewed documented beta blocker date and time   Airway Mallampati: II  TM Distance: >3 FB Neck ROM: Full    Dental  (+) Partial Lower, Dental Advisory Given, Caps   Pulmonary former smoker,    breath sounds clear to auscultation       Cardiovascular hypertension, Pt. on home beta blockers and Pt. on medications + CAD   Rhythm:Regular Rate:Normal     Neuro/Psych  Headaches, negative psych ROS   GI/Hepatic Neg liver ROS, PUD, GERD  Medicated,  Endo/Other  negative endocrine ROS  Renal/GU negative Renal ROS  negative genitourinary   Musculoskeletal  (+) Arthritis , Osteoarthritis,    Abdominal   Peds negative pediatric ROS (+)  Hematology Multiple myeloma   Anesthesia Other Findings   Reproductive/Obstetrics negative OB ROS                          Lab Results  Component Value Date   WBC 3.8 (L) 12/12/2015   HGB 12.4 (L) 12/12/2015   HCT 38.3 (L) 12/12/2015   MCV 103.5 (H) 12/12/2015   PLT 216 12/12/2015   Lab Results  Component Value Date   CREATININE 0.87 12/12/2015   BUN 11 12/12/2015   NA 141 12/12/2015   K 4.0 12/12/2015   CL 105 12/12/2015   CO2 27 12/12/2015   Lab Results  Component Value Date   INR 1.02 12/12/2015   INR 4.14 (H) 04/10/2010   INR Sent out for confirmation 04/10/2010   PROTIME Sent out for confirmation 04/10/2010     Anesthesia Physical Anesthesia Plan  ASA: II  Anesthesia Plan: Spinal   Post-op Pain Management:  Regional for Post-op pain   Induction: Intravenous  Airway Management Planned: Natural Airway  Additional Equipment:   Intra-op Plan:   Post-operative Plan:   Informed Consent: I have reviewed the patients History and Physical, chart, labs and  discussed the procedure including the risks, benefits and alternatives for the proposed anesthesia with the patient or authorized representative who has indicated his/her understanding and acceptance.     Plan Discussed with: CRNA  Anesthesia Plan Comments:         Anesthesia Quick Evaluation

## 2015-12-23 NOTE — Anesthesia Procedure Notes (Signed)
Spinal  Patient location during procedure: OR Start time: 12/23/2015 10:28 AM End time: 12/23/2015 10:32 AM Staffing Anesthesiologist: Suella Broad D Performed: anesthesiologist  Preanesthetic Checklist Completed: patient identified, site marked, surgical consent, pre-op evaluation, timeout performed, IV checked, risks and benefits discussed and monitors and equipment checked Spinal Block Patient position: sitting Prep: Betadine Patient monitoring: heart rate, continuous pulse ox, blood pressure and cardiac monitor Approach: midline Location: L4-5 Injection technique: single-shot Needle Needle type: Introducer and Pencan  Needle gauge: 24 G Needle length: 9 cm Additional Notes Negative paresthesia. Negative blood return. Positive free-flowing CSF. Expiration date of kit checked and confirmed. Patient tolerated procedure well, without complications.

## 2015-12-23 NOTE — Progress Notes (Signed)
Orthopedic Tech Progress Note Patient Details:  Gene Hill 1940/09/26 [id number]  CPM Right Knee CPM Right Knee: On Right Knee Flexion (Degrees): 90 Right Knee Extension (Degrees): 0 Additional Comments: trapeze bar patient helper   Hildred Priest 12/23/2015, 1:23 PM Viewed order from doctor's order list; footsie roll will be provided when it arrives in our stock

## 2015-12-23 NOTE — Brief Op Note (Signed)
12/23/2015  12:35 PM  PATIENT:  Gene Hill  75 y.o. male  PRE-OPERATIVE DIAGNOSIS:  OA RIGHT KNEE  POST-OPERATIVE DIAGNOSIS:  OA RIGHT KNEE  PROCEDURE:  Procedure(s): TOTAL KNEE ARTHROPLASTY (Right)  SURGEON:  Surgeon(s) and Role:    * Earlie Server, MD - Primary  PHYSICIAN ASSISTANT: Chriss Czar, PA-C  ASSISTANTS:   ANESTHESIA:   local, regional and spinal  EBL:  Total I/O In: 1000 [I.V.:1000] Out: -   BLOOD ADMINISTERED:none  DRAINS: none   LOCAL MEDICATIONS USED:  MARCAINE     SPECIMEN:  No Specimen  DISPOSITION OF SPECIMEN:  N/A  COUNTS:  YES  TOURNIQUET:   Total Tourniquet Time Documented: Thigh (Right) - 68 minutes Total: Thigh (Right) - 68 minutes   DICTATION: .Other Dictation: Dictation Number unknown  PLAN OF CARE: Admit to inpatient   PATIENT DISPOSITION:  PACU - hemodynamically stable.   Delay start of Pharmacological VTE agent (>24hrs) due to surgical blood loss or risk of bleeding: yes

## 2015-12-23 NOTE — Discharge Instructions (Signed)

## 2015-12-23 NOTE — Transfer of Care (Signed)
Immediate Anesthesia Transfer of Care Note  Patient: Gene Hill  Procedure(s) Performed: Procedure(s): TOTAL KNEE ARTHROPLASTY (Right)  Patient Location: PACU  Anesthesia Type:Spinal  Level of Consciousness: awake, alert , sedated and patient cooperative  Airway & Oxygen Therapy: Patient Spontanous Breathing and Patient connected to nasal cannula oxygen  Post-op Assessment: Report given to RN and Post -op Vital signs reviewed and stable  Post vital signs: Reviewed and stable  Last Vitals:  Vitals:   12/23/15 0844 12/23/15 1251  BP: (!) 149/88   Pulse: 72   Temp: 36.8 C (P) 36.4 C    Last Pain:  Vitals:   12/23/15 0844  TempSrc: Oral         Complications: No apparent anesthesia complications

## 2015-12-23 NOTE — Anesthesia Procedure Notes (Signed)
Anesthesia Regional Block:  Adductor canal block  Pre-Anesthetic Checklist: ,, timeout performed, Correct Patient, Correct Site, Correct Laterality, Correct Procedure, Correct Position, site marked, Risks and benefits discussed,  Surgical consent,  Pre-op evaluation,  At surgeon's request and post-op pain management  Laterality: Right  Prep: chloraprep       Needles:  Injection technique: Single-shot  Needle Type: Echogenic Needle     Needle Length: 9cm 9 cm Needle Gauge: 21 and 21 G    Additional Needles:  Procedures: ultrasound guided (picture in chart) Adductor canal block Narrative:  Start time: 12/23/2015 10:05 AM End time: 12/23/2015 10:10 AM Injection made incrementally with aspirations every 5 mL.  Performed by: Personally  Anesthesiologist: Suella Broad D  Additional Notes: No complications noted. Pt tolerated well.

## 2015-12-24 LAB — BASIC METABOLIC PANEL
ANION GAP: 9 (ref 5–15)
BUN: 14 mg/dL (ref 6–20)
CALCIUM: 8.7 mg/dL — AB (ref 8.9–10.3)
CO2: 25 mmol/L (ref 22–32)
CREATININE: 0.9 mg/dL (ref 0.61–1.24)
Chloride: 105 mmol/L (ref 101–111)
Glucose, Bld: 108 mg/dL — ABNORMAL HIGH (ref 65–99)
Potassium: 3.9 mmol/L (ref 3.5–5.1)
SODIUM: 139 mmol/L (ref 135–145)

## 2015-12-24 LAB — CBC
HCT: 33 % — ABNORMAL LOW (ref 39.0–52.0)
Hemoglobin: 10.7 g/dL — ABNORMAL LOW (ref 13.0–17.0)
MCH: 33.3 pg (ref 26.0–34.0)
MCHC: 32.4 g/dL (ref 30.0–36.0)
MCV: 102.8 fL — ABNORMAL HIGH (ref 78.0–100.0)
PLATELETS: 172 10*3/uL (ref 150–400)
RBC: 3.21 MIL/uL — ABNORMAL LOW (ref 4.22–5.81)
RDW: 16.3 % — AB (ref 11.5–15.5)
WBC: 7.9 10*3/uL (ref 4.0–10.5)

## 2015-12-24 MED ORDER — HYDROCODONE-ACETAMINOPHEN 7.5-325 MG PO TABS
1.0000 | ORAL_TABLET | ORAL | Status: DC | PRN
  Administered 2015-12-25 (×2): 2 via ORAL
  Filled 2015-12-24 (×2): qty 2

## 2015-12-24 MED ORDER — ALUM & MAG HYDROXIDE-SIMETH 200-200-20 MG/5ML PO SUSP
30.0000 mL | Freq: Four times a day (QID) | ORAL | Status: DC | PRN
  Administered 2015-12-25: 30 mL via ORAL
  Filled 2015-12-24: qty 30

## 2015-12-24 NOTE — Discharge Summary (Addendum)
Gene Hill ID: Gene Hill MRN: [id number] DOB/AGE: 1940/11/17 75 y.o.  Admit date: 12/23/2015 Discharge date: 12/25/2015  Admission Diagnoses:  Active Problems:   Primary localized osteoarthritis of right knee   Discharge Diagnoses:  Same  Past Medical History:  Diagnosis Date  . Arthritis   . 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 Blood dyscrasia    low WBC d/t multiple myeloma  . Blood transfusion   . Carotid bruit    3-11 carotid u/s was  (-)  . 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 GERD (gastroesophageal reflux disease)   . Headache   . Heart murmur   . HYPERLIPIDEMIA 05/03/2006  . HYPERTENSION 05/03/2006  . 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 PEPTIC ULCER DISEASE 07/22/2008   per EGD 6-10 .Marland Kitchen ulcer duodenitis     Surgeries: Procedure(s): TOTAL KNEE ARTHROPLASTY on 12/23/2015   Consultants:   Discharged Condition: Improved  Hospital Course: Gene Hill is an 75 y.o. male who was admitted 12/23/2015 for operative treatment of primary localized osteoarthritis right knee. Gene Hill has severe unremitting pain that affects sleep, daily activities, and work/hobbies. After pre-op clearance the Gene Hill was taken to the operating room on 12/23/2015 and underwent  Procedure(s): TOTAL KNEE ARTHROPLASTY.    Gene Hill was given perioperative antibiotics:  Anti-infectives    Start     Dose/Rate Route Frequency Ordered Stop   12/23/15 2200  vancomycin (VANCOCIN) IVPB 1000 mg/200 mL premix     1,000 mg 200 mL/hr over 60 Minutes Intravenous Every 12 hours 12/23/15 1651 12/23/15 2225   12/23/15 1000  vancomycin (VANCOCIN) IVPB 1000 mg/200 mL premix     1,000 mg 200 mL/hr over 60 Minutes Intravenous  Once 12/23/15 0748 12/23/15 1113       Gene Hill was given sequential compression devices, early ambulation, and chemoprophylaxis to prevent DVT.  Gene Hill benefited  maximally from hospital stay and there were no complications.    Recent vital signs:  Gene Hill Vitals for the past 24 hrs:  BP Temp Temp src Pulse Resp SpO2  12/25/15 0345 (!) 138/100 97.6 F (36.4 C) Oral 89 - 91 %  12/24/15 2144 130/73 - - 80 - -  12/24/15 2007 130/73 99.6 F (37.6 C) Oral 81 - 94 %  12/24/15 1447 128/74 98.4 F (36.9 C) Oral 78 16 97 %     Recent laboratory studies:   Recent Labs  12/24/15 0615 12/25/15 0340  WBC 7.9 6.6  HGB 10.7* 9.9*  HCT 33.0* 30.8*  PLT 172 158  NA 139  --   K 3.9  --   CL 105  --   CO2 25  --   BUN 14  --   CREATININE 0.90  --   GLUCOSE 108*  --   CALCIUM 8.7*  --      Discharge Medications:     Medication List    STOP taking these medications   aspirin EC 325 MG tablet     TAKE these medications   apixaban 2.5 MG Tabs tablet Commonly known as:  ELIQUIS Take 1 tablet (2.5 mg total) by mouth 2 (two) times daily.   cyclobenzaprine 5 MG tablet Commonly known as:  FLEXERIL Take 5 mg by mouth 3 (three) times daily as needed for muscle spasms.   Fish Oil 1200 MG Caps Take 1,200 mg by mouth daily.   ixazomib  citrate 3 MG capsule Commonly known as:  NINLARO Take 1 capsule every 2 weeks on an empty stomach.   lidocaine 5 % ointment Commonly known as:  XYLOCAINE Apply 1 application topically 2 (two) times daily as needed. Mix w/ OTC Hydrocortisone 1 %   metoprolol 100 MG tablet Commonly known as:  LOPRESSOR Take 0.5 tablets (50 mg total) by mouth 2 (two) times daily.   multivitamin tablet Take 1 tablet by mouth daily.   oxyCODONE-acetaminophen 5-325 MG tablet Commonly known as:  ROXICET Take 1-2 tablets by mouth every 4 (four) hours as needed.   pantoprazole 40 MG tablet Commonly known as:  PROTONIX Take 1 tablet (40 mg total) by mouth daily.   POMALYST 3 MG capsule Generic drug:  pomalidomide Take 1 capsule by mouth daily. Take with water on days 1-21. Repeat every 28 days. DUPB#[id number]   PROBIOTIC  PO Take 1 tablet by mouth daily.   traMADol 50 MG tablet Commonly known as:  ULTRAM Take 2 tablets (100 mg total) by mouth every 6 (six) hours as needed for moderate pain. What changed:  how much to take  when to take this  reasons to take this   Vitamin D3 2000 units capsule Take 2,000 Units by mouth daily.            Durable Medical Equipment        Start     Ordered   12/24/15 [phone]  For home use only DME Walker  Once    Question:  Gene Hill needs a walker to treat with the following condition  Answer:  S/P TKR (total knee replacement)   12/24/15 0916   12/24/15 0914  DME Walker rolling  Once    Question:  Gene Hill needs a walker to treat with the following condition  Answer:  Primary localized osteoarthritis of right knee   12/24/15 0916   12/23/15 1652  DME 3 n 1  Once     12/23/15 1651      Diagnostic Studies: No results found.  Disposition: 01-Home or Self Care    Follow-up Information    CAFFREY JR,W D, MD. Schedule an appointment as soon as possible for a visit in 2 week(s).   Specialty:  Orthopedic Surgery Contact information: [address] [place] [address] [phone]        [facility] - [facility] Follow up.   Why:  Gene Hill has been delivered to your room Contact information: [place] [address] [phone]        [facility] Follow up.   Why:  HHPT has been ordered by your MD. A representative will contact you within 24-48 hours after your discharge to arrange initial contact.  Contact information: [address] [place] [address] [phone]            Signed: Fannie Knee 12/25/2015, 9:02 AM

## 2015-12-24 NOTE — Op Note (Signed)
NAME:  Gene Hill, Gene Hill NO.:  [id number]  MEDICAL RECORD NO.:  [id number]  LOCATION:                               FACILITY:  [facility]  PHYSICIAN:  Lockie Pares, M.D.    DATE OF BIRTH:  14-Oct-1940  DATE OF PROCEDURE: DATE OF DISCHARGE:                              OPERATIVE REPORT   PREOPERATIVE DIAGNOSIS:  Severe osteoarthritis, right knee, with varus deformity, flexion contracture.  POSTOPERATIVE DIAGNOSIS:  Severe osteoarthritis, right knee, with varus deformity, flexion contracture.  OPERATION:  Right total knee replacement (Sigma cemented knee with size 5 femur tibia, 41 mm all poly patella with 10 mm bearing).  SURGEON:  Lockie Pares, M.D.  ASSISTANTMarjo Bicker, PA.  ANESTHESIA:  Spinal anesthetic with adductor block supplemented with local.  DESCRIPTION OF PROCEDURE:  In supine position, exsanguination of leg, inflation of tourniquet to 350.  Straight skin incision with medial parapatellar approach to the knee was made.  Had 11 mm 5 degree valgus cut on the femur.  We followed that by cutting about 4 mm below the most diseased medial compartment.  The patient did have a significant varus deformity requiring medial release.  The extension gap was measured at 10 mm.  The femur was sized to be a size 5 followed by placement of the all in one cutting block with the appropriate degree of external rotation, cut to the anterior posterior chamber cuts.  PCL was released. Capsular stripping was carried out as well.  The flexion gap equal the extension gap at 10 mm.  The keel was cut for the tibia.  Box for the femur as well.  The trial of the femur and tibia was placed with a 10 mm bearing.  We cut a left 14 to 15 mm of native patella for 41 mm all poly trial.  The patient had probably a 10-15 degree flexion contracture at 0 degrees with the trial in as well as resolution of varus deformity with good balancing of all ligaments and the bearing was stable in  all positions tested.  Final components were inserted, tibia followed by femur patella.  We elected to use a trial bearing while the cement hardened.  We did the patient's medical history.  Risk factors.  We used antibiotic impregnated cement.  A mixture of Exparel, Marcaine was infiltrated in the capsule and subcutaneous tissues.  Once the cement was hardened, removed the trial bearing.  No excess cement was noted.  Tourniquet released.  No excessive bleeding was noted.  Final bearing was placed. Closure was affected with #1 Ethibond, 2-0 Vicryl, and staples.  Lightly compressive sterile dressing was applied.  Taken to recovery room in stable condition.     Lockie Pares, M.D.   ______________________________ Lockie Pares, M.D.    WDC/MEDQ  D:  12/23/2015  T:  12/24/2015  Job:  [id number]

## 2015-12-24 NOTE — Care Management Note (Signed)
Case Management Note  Patient Details  Name: Gene Hill MRN: [id number] Date of Birth: Oct 26, 1940  Subjective/Objective: 75 y.o. M admitted 12/23/2015 for R TKA. To be discharged 12/25/2015. Pt tells me he has DME which was delivered to room. Would like [facility] to provide HHPT when discharged. Wife is in room and understands that should [employer]  Call with different instructions she should follow their instructions as they are the payor source. (she has been communicating with someone from [facility])  Made [name] aware of referral.                Action/Plan: No further CM needs at this time.    Expected Discharge Date:                  Expected Discharge Plan:  [place]  In-House Referral:  NA  Discharge planning Services  CM Consult  Post Acute Care Choice:  Durable Medical Equipment, Home Health Choice offered to:  Patient, Spouse  DME Arranged:  Walker rolling DME Agency:  [place]:  PT [place]:  [place]  Status of Service:  Completed, signed off  If discussed at [place] of Stay Meetings, dates discussed:    Additional Comments:  Delrae Sawyers, RN 12/24/2015, 3:54 PM

## 2015-12-24 NOTE — Op Note (Deleted)
  The note originally documented on this encounter has been moved the the encounter in which it belongs.  

## 2015-12-24 NOTE — Progress Notes (Signed)
Subjective: 1 Day Post-Op Procedure(s) (LRB): TOTAL KNEE ARTHROPLASTY (Right) Patient reports pain as mild.  No nausea/vomiting, lightheadedness/dizziness, chest pain/sob.  Positive flatus/bm.  Tolerating diet.  Objective: Vital signs in last 24 hours: Temp:  [97 F (36.1 C)-98.4 F (36.9 C)] 98.3 F (36.8 C) (12/02 0545) Pulse Rate:  [57-97] 77 (12/02 0545) Resp:  [10-19] 16 (12/02 0545) BP: (109-134)/(64-78) 134/78 (12/02 0545) SpO2:  [93 %-100 %] 97 % (12/02 0545)  Intake/Output from previous day: 12/01 0701 - 12/02 0700 In: 1810 [I.V.:1700; IV Piggyback:110] Out: 625 [Urine:550; Blood:75] Intake/Output this shift: No intake/output data recorded.   Recent Labs  12/24/15 0615  HGB 10.7*    Recent Labs  12/24/15 0615  WBC 7.9  RBC 3.21*  HCT 33.0*  PLT 172    Recent Labs  12/24/15 0615  NA 139  K 3.9  CL 105  CO2 25  BUN 14  CREATININE 0.90  GLUCOSE 108*  CALCIUM 8.7*   No results for input(s): LABPT, INR in the last 72 hours.  Neurologically intact Neurovascular intact Sensation intact distally Intact pulses distally Dorsiflexion/Plantar flexion intact Incision: dressing C/D/I No cellulitis present Compartment soft  Assessment/Plan: 1 Day Post-Op Procedure(s) (LRB): TOTAL KNEE ARTHROPLASTY (Right) Advance diet Up with therapy D/C IV fluids Discharge home with home health today after second session of PT as long as he does well WBAT RLE ABLA-mild and stable Dry dressing change prn  Fannie Knee 12/24/2015, 9:18 AM

## 2015-12-24 NOTE — Progress Notes (Signed)
Physical Therapy Treatment Patient Details Name: Gene Hill MRN: [id number] DOB: 12-23-1940 Today's Date: 12/24/2015    History of Present Illness Gene Hill is an 75 y.o. male who was admitted 12/23/2015 for operative treatment of primary localized osteoarthritis right knee.   Pt underwent right TKA on 12/23/15.Marland Kitchen     PT Comments    Pt limited by pain this pm. He is hopeful for DC tomorrow if pain better under control.  Focussed pm session on ther-ex.    Follow Up Recommendations  Home health PT;Supervision/Assistance - 24 hour     Equipment Recommendations  Rolling walker with 5" wheels    Recommendations for Other Services       Precautions / Restrictions Precautions Precautions: Fall;Knee Precaution Comments: Discussed no use of pillow under right knee and use of KI for walking Required Braces or Orthoses: Knee Immobilizer - Right Knee Immobilizer - Right: On when out of bed or walking Restrictions Weight Bearing Restrictions: Yes RLE Weight Bearing: Weight bearing as tolerated    Mobility  Bed Mobility  Transfers  Ambulation/Gait             General Gait Details: Pt declined ambulation and stair training this pm secondary to increase in pain and will not DC until tomorrow.  He had just returned to bed after ambulating back from the bed and stated that his head felt really funny but couldn't articulate exactly how he felt.  Pt was agreeable to Ther-ex   Stairs            Wheelchair Mobility    Modified Rankin (Stroke Patients Only)       Balance                  Cognition Arousal/Alertness: Awake/alert Behavior During Therapy: WFL for tasks assessed/performed Overall Cognitive Status: Within Functional Limits for tasks assessed                      Exercises Total Joint Exercises Ankle Circles/Pumps: AROM;Both;10 reps;Supine Quad Sets: AROM;Right;5 reps;Supine Short Arc Quad: AAROM;Right;10 reps;Supine Heel Slides:  AAROM;5 reps;Supine;Right Hip ABduction/ADduction: AAROM;Right;10 reps;Supine Straight Leg Raises: AAROM;Right;10 reps;Supine    General Comments General comments (skin integrity, edema, etc.): Pt's family left room during sesison.  Discussed/educated on proper positioning of LLE in bed and impotance of keeping leg straight.       Pertinent Vitals/Pain Pain Assessment: 0-10 Pain Score: 9  Faces Pain Scale: Hurts even more Pain Location: Mid, lateral thigh Pain Descriptors / Indicators: Aching;Grimacing;Guarding Pain Intervention(s): Premedicated before session;Limited activity within patient's tolerance;Monitored during session    Gene Hill expects to be discharged to:: Private residence Living Arrangements: Spouse/significant other;Children Available Help at Discharge: Family;Available 24 hours/day Type of Home: House Home Access: Stairs to enter Entrance Stairs-Rails: Right Home Layout: Two level;1/2 bath on main level;Bed/bath upstairs Home Equipment: Bedside commode;Adaptive equipment      Prior Function Level of Independence: Independent          PT Goals (current goals can now be found in the care plan section) Acute Rehab PT Goals Patient Stated Goal: to go home Progress towards PT goals: Progressing toward goals    Frequency    7X/week      PT Plan Current plan remains appropriate    Co-evaluation             End of Session   Activity Tolerance: Patient limited by pain Patient left: in bed;with call bell/phone within  reach     Time: [id number] PT Time Calculation (min) (ACUTE ONLY): 32 min  Charges:  $Therapeutic Exercise: 23-37 mins                    G Codes:      Melvern Banker Dec 28, 2015, 3:13 PM  Lavonia Dana, PT  [phone] Dec 28, 2015

## 2015-12-24 NOTE — Progress Notes (Signed)
[facility] called for pain med, when I brought tramadol 100mg  to pt, patient said tramadol make him dizzy. Per patient request, he would like dilaudid instead of tramadol. This RN returned tramadal and administered diludid 0.5 mg to patient. Will monitor patient closely.

## 2015-12-24 NOTE — Evaluation (Signed)
Occupational Therapy Evaluation Patient Details Name: Gene Hill MRN: [id number] DOB: 09-Aug-1940 Today's Date: 12/24/2015    History of Present Illness Gene Hill is an 75 y.o. male who was admitted 12/23/2015 for operative treatment of primary localized osteoarthritis right knee.   Pt underwent right TKA on 12/23/15..    Clinical Impression   Pt reports he was independent with ADL PTA. Currently pt requires supervision for ADL and functional mobility with the exception of min assist for LB ADL. Educated pt on full knee extension when sitting/in bed; pt verbalized understanding. Pt planning to d/c home with 24/7 supervision from family. Pt would benefit from continued skilled OT to address established goals.    Follow Up Recommendations  No OT follow up;Supervision/Assistance - 24 hour    Equipment Recommendations  None recommended by OT    Recommendations for Other Services       Precautions / Restrictions Precautions Precautions: Fall;Knee Precaution Comments: Discussed no use of pillow under right knee.  Required Braces or Orthoses: Knee Immobilizer - Right Restrictions Weight Bearing Restrictions: Yes RLE Weight Bearing: Weight bearing as tolerated      Mobility Bed Mobility Overal bed mobility: Needs Assistance Bed Mobility: Sit to Supine     Sit to supine: Min guard   General bed mobility comments: Min guard for safety. Pt able to manage RLE without assist but did use overhead trapeze for positioning.  Transfers Overall transfer level: Needs assistance Equipment used: Rolling walker (2 wheeled) Transfers: Sit to/from Stand Sit to Stand: Supervision         General transfer comment: Supervision for safety with sit to stand from chair x1, BSC x1. Good hand placement, cues for technique with KI.    Balance Overall balance assessment: Needs assistance Sitting-balance support: Feet supported;No upper extremity supported Sitting balance-Leahy Scale:  Good     Standing balance support: During functional activity;No upper extremity supported Standing balance-Leahy Scale: Fair Standing balance comment: Pt able to perform clothing management and grooming at sink without UE support.                            ADL Overall ADL's : Needs assistance/impaired Eating/Feeding: Set up;Sitting   Grooming: Supervision/safety;Standing;Wash/dry hands   Upper Body Bathing: Set up;Supervision/ safety;Sitting   Lower Body Bathing: Minimal assistance;Sit to/from stand   Upper Body Dressing : Set up;Supervision/safety;Sitting   Lower Body Dressing: Minimal assistance;Sit to/from stand Lower Body Dressing Details (indicate cue type and reason): Pt able to reach L foot, difficulty reaching R foot with KI donned. Educated on R leg first into clothing. Toilet Transfer: Supervision/safety;Ambulation;BSC;RW   Toileting- Clothing Manipulation and Hygiene: Supervision/safety;Sit to/from Nurse, children's Details (indicate cue type and reason): Pt reports he does have 3 in 1 at home; educated pt and daughter that 3 in 1 can be used in tub as a seat-plan to practice tub transfer to 3 in 1 next session. Functional mobility during ADLs: Supervision/safety;Rolling walker General ADL Comments: Educated pt on maintaining full knee extension when in sitting or in bed. Positioned RLE with pillow under heel to acheive full knee extension.     Vision Vision Assessment?: No apparent visual deficits   Perception     Praxis      Pertinent Vitals/Pain Pain Assessment: Faces Faces Pain Scale: Hurts even more Pain Location: R thigh Pain Descriptors / Indicators: Aching;Grimacing;Guarding Pain Intervention(s): Monitored during session;Repositioned;Patient requesting pain meds-RN  notified;Ice applied     Hand Dominance     Extremity/Trunk Assessment Upper Extremity Assessment Upper Extremity Assessment: Overall WFL for tasks assessed    Lower Extremity Assessment Lower Extremity Assessment: Defer to PT evaluation    Cervical / Trunk Assessment Cervical / Trunk Assessment: Normal   Communication Communication Communication: No difficulties   Cognition Arousal/Alertness: Awake/alert Behavior During Therapy: WFL for tasks assessed/performed Overall Cognitive Status: Within Functional Limits for tasks assessed                     General Comments       Exercises      Shoulder Instructions      Home Living Family/patient expects to be discharged to:: Private residence Living Arrangements: Spouse/significant other;Children Available Help at Discharge: Family;Available 24 hours/day Type of Home: House Home Access: Stairs to enter [employer] of Steps: 2+2 Entrance Stairs-Rails: Right Home Layout: Two level;1/2 bath on main level;Bed/bath upstairs Alternate Level Stairs-Number of Steps: 14 Alternate Level Stairs-Rails: Right Bathroom Shower/Tub: Tub/shower unit Shower/tub characteristics: Curtain Biochemist, clinical: Standard Bathroom Accessibility: Yes   Home Equipment: Bedside commode;Adaptive equipment Adaptive Equipment: Reacher        Prior Functioning/Environment Level of Independence: Independent                 OT Problem List: Impaired balance (sitting and/or standing);Decreased activity tolerance;Decreased knowledge of use of DME or AE;Decreased knowledge of precautions;Pain   OT Treatment/Interventions: Self-care/ADL training;Energy conservation;DME and/or AE instruction;Therapeutic activities;Patient/family education    OT Goals(Current goals can be found in the care plan section) Acute Rehab OT Goals Patient Stated Goal: to go home OT Goal Formulation: With patient Time For Goal Achievement: 01/07/16 Potential to Achieve Goals: Good ADL Goals Pt Will Perform Tub/Shower Transfer: Tub transfer;with supervision;ambulating;3 in 1;rolling walker  OT Frequency: Min  2X/week   Barriers to D/C:            Co-evaluation              End of Session Equipment Utilized During Treatment: Rolling walker;Right knee immobilizer CPM Right Knee CPM Right Knee: Off Nurse Communication: Mobility status;Patient requests pain meds  Activity Tolerance: Patient tolerated treatment well Patient left: in bed;with call bell/phone within reach;with family/visitor present   Time: [id number] OT Time Calculation (min): 25 min Charges:  OT General Charges $OT Visit: 1 Procedure OT Evaluation $OT Eval Moderate Complexity: 1 Procedure OT Treatments $Self Care/Home Management : 8-22 mins G-Codes:     Binnie Kand M.S., OTR/L Pager: [phone]  12/24/2015, 11:26 AM

## 2015-12-24 NOTE — Progress Notes (Signed)
This is a social visit for Gene Hill.  He had no surgery for his right knee. Looks like he did quite well. He actually may be going home tomorrow or possibly even today.  He has myeloma. This is been under very good control. He's been on Ninlaro and Pomalidomide for this.  I very much appreciate the help from Dr. French Ana of orthopedic surgery. I know that this will get [name] back to being able to do his gardening. This is a true passion of his.  Lattie Haw, MD

## 2015-12-24 NOTE — Evaluation (Signed)
Physical Therapy Evaluation Patient Details Name: Gene Hill MRN: [id number] DOB: 07-19-1940 Today's Date: 12/24/2015   History of Present Illness  Gene Hill is an 75 y.o. male who was admitted 12/23/2015 for operative treatment of primary localized osteoarthritis right knee.   Pt underwent right TKA on 12/23/15..   Clinical Impression  Pt admitted with above diagnosis. Pt currently with functional limitations due to the deficits listed below (see PT Problem List). Pt was able to ambulate and perform exercises with assist.  Will follow acutely.  Has great family support.  Progressing well.  Pt will benefit from skilled PT to increase their independence and safety with mobility to allow discharge to the venue listed below.      Follow Up Recommendations Home health PT;Supervision/Assistance - 24 hour    Equipment Recommendations  Rolling walker with 5" wheels;Other (comment) (may purchase tub bench but to decide once home)    Recommendations for Other Services       Precautions / Restrictions Precautions Precautions: Fall;Knee Precaution Comments: Discussed no use of pillow under right knee.  Required Braces or Orthoses: Knee Immobilizer - Right Restrictions Weight Bearing Restrictions: Yes RLE Weight Bearing: Weight bearing as tolerated      Mobility  Bed Mobility Overal bed mobility: Needs Assistance Bed Mobility: Supine to Sit     Supine to sit: Min assist;Min guard     General bed mobility comments: Cues given and pt was able to move LE off bed after initial lift of LE initiated.  Pt to EOB without physical assist.   Transfers Overall transfer level: Needs assistance Equipment used: Rolling walker (2 wheeled) Transfers: Sit to/from Stand Sit to Stand: Min assist         General transfer comment: Needed min cues for hand placement as pt wanting to pull up on RW.  Once up, needed steadying assist as he has posterior lean.   Ambulation/Gait Ambulation/Gait  assistance: Min guard;+2 safety/equipment Ambulation Distance (Feet): 135 Feet Assistive device: Rolling walker (2 wheeled) Gait Pattern/deviations: Step-to pattern;Decreased step length - right;Decreased stance time - right;Decreased weight shift to right;Antalgic;Trunk flexed;Leaning posteriorly   Gait velocity interpretation: Below normal speed for age/gender General Gait Details: Pt ambulated with RW with cues for sequencing steps and RW.  Pt at times needed steadying assist due to slight posterior lean.   Stairs            Wheelchair Mobility    Modified Rankin (Stroke Patients Only)       Balance Overall balance assessment: Needs assistance Sitting-balance support: No upper extremity supported;Feet supported Sitting balance-Leahy Scale: Fair     Standing balance support: Bilateral upper extremity supported;During functional activity Standing balance-Leahy Scale: Poor Standing balance comment: Pt able to stand statically with one UE support to pull up pants but unsteady needing steadying support.                               Pertinent Vitals/Pain Pain Assessment: 0-10 Pain Score: 5  Pain Location: right knee Pain Descriptors / Indicators: Aching;Grimacing;Guarding;Sore Pain Intervention(s): Limited activity within patient's tolerance;Monitored during session;Patient requesting pain meds-RN notified;Repositioned  VSS    Home Living Family/patient expects to be discharged to:: Private residence Living Arrangements: Spouse/significant other;Children Available Help at Discharge: Family;Available 24 hours/day Type of Home: House Home Access: Stairs to enter Entrance Stairs-Rails: Right Entrance Stairs-Number of Steps: 2+2 Home Layout: Two level;1/2 bath on main level;Bed/bath upstairs Home  Equipment: Bedside commode;Adaptive equipment      Prior Function Level of Independence: Independent               Hand Dominance         Extremity/Trunk Assessment   Upper Extremity Assessment: Defer to OT evaluation           Lower Extremity Assessment: RLE deficits/detail RLE Deficits / Details: Pt flexion 7-85 degrees with AA/ROM.  Knee 2+/5, hip 3/5, ankle 3+/5    Cervical / Trunk Assessment: Normal  Communication   Communication: No difficulties  Cognition Arousal/Alertness: Awake/alert Behavior During Therapy: WFL for tasks assessed/performed Overall Cognitive Status: Within Functional Limits for tasks assessed                      General Comments      Exercises Total Joint Exercises Ankle Circles/Pumps: AROM;Both;10 reps;Supine Quad Sets: AROM;Both;10 reps;Seated Gluteal Sets: AROM;Both;10 reps;Seated Heel Slides: 10 reps;Supine;AAROM;Right Hip ABduction/ADduction: 10 reps;Seated;AAROM;Right Straight Leg Raises: AAROM;10 reps;Right;Seated Long Arc Quad: AAROM;Right;Seated;5 reps Knee Flexion: AAROM;Right;5 reps;Seated Goniometric ROM: 7-85 degrees   Assessment/Plan    PT Assessment Patient needs continued PT services  PT Problem List Decreased activity tolerance;Decreased balance;Decreased mobility;Decreased strength;Decreased range of motion;Decreased knowledge of use of DME;Decreased safety awareness;Decreased knowledge of precautions;Pain          PT Treatment Interventions DME instruction;Gait training;Functional mobility training;Therapeutic activities;Stair training;Therapeutic exercise;Balance training;Patient/family education    PT Goals (Current goals can be found in the Care Plan section)  Acute Rehab PT Goals Patient Stated Goal: to go home PT Goal Formulation: With patient Time For Goal Achievement: 12/31/15 Potential to Achieve Goals: Good    Frequency 7X/week   Barriers to discharge        Co-evaluation               End of Session Equipment Utilized During Treatment: Gait belt;Right knee immobilizer Activity Tolerance: Patient limited by  fatigue;Patient limited by pain Patient left: in chair;with call bell/phone within reach;with family/visitor present Nurse Communication: Mobility status;Patient requests pain meds         Time: [id number] PT Time Calculation (min) (ACUTE ONLY): 41 min   Charges:   PT Evaluation $PT Eval Moderate Complexity: 1 Procedure PT Treatments $Gait Training: 8-22 mins $Therapeutic Exercise: 8-22 mins   PT G Codes:        Godfrey Pick Catherin Doorn 01/15/2016, 11:00 AM Zamire Whitehurst,PT Acute Rehabilitation [phone] (pager)

## 2015-12-25 LAB — CBC
HEMATOCRIT: 30.8 % — AB (ref 39.0–52.0)
Hemoglobin: 9.9 g/dL — ABNORMAL LOW (ref 13.0–17.0)
MCH: 33.3 pg (ref 26.0–34.0)
MCHC: 32.1 g/dL (ref 30.0–36.0)
MCV: 103.7 fL — ABNORMAL HIGH (ref 78.0–100.0)
PLATELETS: 158 10*3/uL (ref 150–400)
RBC: 2.97 MIL/uL — ABNORMAL LOW (ref 4.22–5.81)
RDW: 16.3 % — AB (ref 11.5–15.5)
WBC: 6.6 10*3/uL (ref 4.0–10.5)

## 2015-12-25 MED ORDER — OXYCODONE-ACETAMINOPHEN 5-325 MG PO TABS: 1.0000 | ORAL_TABLET | ORAL | 0 refills | Status: DC | PRN

## 2015-12-25 MED ORDER — CYCLOBENZAPRINE HCL 10 MG PO TABS
10.0000 mg | ORAL_TABLET | Freq: Three times a day (TID) | ORAL | Status: DC | PRN
  Administered 2015-12-25 – 2015-12-26 (×2): 10 mg via ORAL
  Filled 2015-12-25 (×2): qty 1

## 2015-12-25 MED ORDER — OXYCODONE-ACETAMINOPHEN 5-325 MG PO TABS
1.0000 | ORAL_TABLET | ORAL | Status: DC | PRN
  Administered 2015-12-25 – 2015-12-26 (×7): 2 via ORAL
  Filled 2015-12-25 (×7): qty 2

## 2015-12-25 NOTE — Progress Notes (Signed)
Occupational Therapy Treatment Patient Details Name: Gene Hill MRN: [id number] DOB: 12-10-1940 Today's Date: 12/25/2015    History of present illness Gene Hill is an 75 y.o. male who was admitted 12/23/2015 for operative treatment of primary localized osteoarthritis right knee.   Pt underwent right TKA on 12/23/15..    OT comments  Pt. Making gains with skilled OT.  Wife and dtr. Present for session and very hands on.  At this time they defer tub transfer and report they will sponge bathe initially.  Will have assistance with LB ADLS also.  Note d/c likely tomorrow.  Will continue to follow acutely.    Follow Up Recommendations  No OT follow up;Supervision/Assistance - 24 hour    Equipment Recommendations  None recommended by OT    Recommendations for Other Services      Precautions / Restrictions Precautions Precautions: Fall;Knee Required Braces or Orthoses: Knee Immobilizer - Right Knee Immobilizer - Right: On when out of bed or walking Restrictions RLE Weight Bearing: Weight bearing as tolerated       Mobility Bed Mobility               General bed mobility comments: seated in recliner at beg. and end of session.  pt.,spouse, and dtr. report bed mobility has not been a  problem  Transfers Overall transfer level: Needs assistance Equipment used: Rolling walker (2 wheeled) Transfers: Sit to/from [facility] Sit to Stand: Supervision Stand pivot transfers: Min guard       General transfer comment: max cues for walker mangement, and safety.  tendancy to push it far in front of him with very fast pace.  also looking down at feet with cues to bring shoulders and head upright    Balance                                   ADL Overall ADL's : Needs assistance/impaired     Grooming: Wash/dry face;Wash/dry hands;Set up;Sitting   Upper Body Bathing: Set up;Supervision/ safety;Sitting   Lower Body Bathing: Minimal  assistance;Sit to/from stand   Upper Body Dressing : Set up;Supervision/safety;Sitting   Lower Body Dressing: Minimal assistance;Sit to/from stand Lower Body Dressing Details (indicate cue type and reason): difficulty reaching R/L LE, will have spouse and dtr. with him at home. present for session and report they will be assisitng him Toilet Transfer: Min guard;Cueing for safety;Cueing for sequencing;Ambulation;RW;BSC;Regular Glass blower/designer Details (indicate cue type and reason): max cues for walker management, walks very far behind RW Toileting- Water quality scientist and Hygiene: Min guard;Sit to/from Nurse, children's Details (indicate cue type and reason): Pt reports he does have 3 in 1 at home; educated pt and daughter that 3 in 1 can be used in tub as a seat-plan to practice tub transfer to 3 in 1.  reviewed this transfer option, after demo pt., spouse, and dtr. state pt. will sponge bathe initially until he feels stronger with wbing through RLE.   Functional mobility during ADLs: Min guard;Cueing for safety;Rolling walker General ADL Comments: wife and dtr. reported main concern is his lack of walker safety.  reviewed at length with pt. and provided demonstration of walker safety and safe use of walker durin sit/stand.        Vision                     Perception  Praxis      Cognition   Behavior During Therapy: WFL for tasks assessed/performed Overall Cognitive Status: Within Functional Limits for tasks assessed                       Extremity/Trunk Assessment               Exercises     Shoulder Instructions       General Comments      Pertinent Vitals/ Pain       Pain Assessment: No/denies pain  Home Living                                          Prior Functioning/Environment              Frequency  Min 2X/week        Progress Toward Goals  OT Goals(current goals can now be found in  the care plan section)  Progress towards OT goals: Progressing toward goals  ADL Goals Pt Will Perform Lower Body Bathing: with supervision;sit to/from stand;with caregiver independent in assisting Pt Will Perform Lower Body Dressing: with supervision;with caregiver independent in assisting;sit to/from stand Pt Will Transfer to Toilet: with supervision;ambulating;bedside commode Pt Will Perform Toileting - Clothing Manipulation and hygiene: with supervision;sit to/from stand  Plan Discharge plan remains appropriate    Co-evaluation                 End of Session Equipment Utilized During Treatment: Rolling walker;Right knee immobilizer   Activity Tolerance Patient tolerated treatment well   Patient Left in chair;with call bell/phone within reach;with family/visitor present   Nurse Communication          Time: [id number] OT Time Calculation (min): 24 min  Charges: OT General Charges $OT Visit: 1 Procedure OT Treatments $Self Care/Home Management : 23-37 mins  Janice Coffin, COTA/L 12/25/2015, 12:01 PM

## 2015-12-25 NOTE — Progress Notes (Signed)
Physical Therapy Treatment Patient Details Name: Gene Hill MRN: [id number] DOB: 10/17/1940 Today's Date: 12/25/2015    History of Present Illness OTT ESSE is an 75 y.o. male who was admitted 12/23/2015 for operative treatment of primary localized osteoarthritis right knee.   Pt underwent right TKA on 12/23/15.Marland Kitchen     PT Comments    Pt denies pain at rest but c/o 8/10 pain RLE with gt.  Initiated stair training this visit.  Completed 5 stairs with min assist.  Pt needs to practice again before d/cing home tomorrow.      Follow Up Recommendations  Home health PT;Supervision/Assistance - 24 hour     Equipment Recommendations  Rolling walker with 5" wheels    Recommendations for Other Services       Precautions / Restrictions Precautions Precautions: Fall;Knee Required Braces or Orthoses: Knee Immobilizer - Right Knee Immobilizer - Right: On when out of bed or walking Restrictions RLE Weight Bearing: Weight bearing as tolerated    Mobility  Bed Mobility           Sit to supine: Min assist   General bed mobility comments: (A) for RLE management  Transfers Overall transfer level: Needs assistance Equipment used: Rolling walker (2 wheeled) Transfers: Sit to/from Stand Sit to Stand: Min guard Stand pivot transfers: Min guard       General transfer comment: cues for UE/LE positioning  Ambulation/Gait Ambulation/Gait assistance: Min guard Ambulation Distance (Feet): 15 Feet Assistive device: Rolling walker (2 wheeled) Gait Pattern/deviations: Step-through pattern;Decreased weight shift to right;Decreased stance time - right;Decreased step length - left Gait velocity: decreased   General Gait Details: cues to keep RW closer to body, posture, and increased Wt shift to RLE during Lt swing phase.     Stairs Stairs: Yes Stairs assistance: Min assist Stair Management: One rail Right;Sideways Number of Stairs: 5 General stair comments: cues for sequencing  & technique.  Bil UE support on 1 rail on rt to simulate home setting.  c/o increased pain.  relies heavily on rail   Wheelchair Mobility    Modified Rankin (Stroke Patients Only)       Balance                                    Cognition Arousal/Alertness: Awake/alert Behavior During Therapy: WFL for tasks assessed/performed Overall Cognitive Status: Within Functional Limits for tasks assessed                      Exercises Total Joint Exercises Ankle Circles/Pumps: AROM;Both;10 reps Quad Sets: AROM;Both;10 reps Straight Leg Raises: AAROM;Strengthening;Right;10 reps    General Comments        Pertinent Vitals/Pain Pain Assessment: 0-10 Pain Score: 8  (0/10 at rest; 8/10 with gt) Pain Location: LLE Pain Descriptors / Indicators: Aching;Sharp Pain Intervention(s): Limited activity within patient's tolerance;Monitored during session;Repositioned;Patient requesting pain meds-RN notified    Home Living                      Prior Function            PT Goals (current goals can now be found in the care plan section) Acute Rehab PT Goals Patient Stated Goal: to go home PT Goal Formulation: With patient Time For Goal Achievement: 12/31/15 Potential to Achieve Goals: Good Progress towards PT goals: Progressing toward goals    Frequency  7X/week      PT Plan Current plan remains appropriate    Co-evaluation             End of Session Equipment Utilized During Treatment: Gait belt;Right knee immobilizer Activity Tolerance: Patient limited by pain Patient left: in bed;in CPM;with call bell/phone within reach;with family/visitor present     Time: [id number] PT Time Calculation (min) (ACUTE ONLY): 37 min  Charges:  $Gait Training: 23-37 mins                    G Codes:      Sena Hitch 12/25/2015, 1:49 PM   Sarajane Marek, PTA [phone] Td

## 2015-12-25 NOTE — Progress Notes (Signed)
Subjective: 2 Days Post-Op Procedure(s) (LRB): TOTAL KNEE ARTHROPLASTY (Right) Patient reports pain as moderate.  Increased pain yesterday once block wore off.  No nausea/vomiting, lightheadedness/dizziness, chest pain/sob.  Tolerating diet.  Objective: Vital signs in last 24 hours: Temp:  [97.6 F (36.4 C)-99.6 F (37.6 C)] 97.6 F (36.4 C) (12/03 0345) Pulse Rate:  [78-89] 89 (12/03 0345) Resp:  [16] 16 (12/02 1447) BP: (128-138)/(73-100) 138/100 (12/03 0345) SpO2:  [91 %-97 %] 91 % (12/03 0345)  Intake/Output from previous day: No intake/output data recorded. Intake/Output this shift: No intake/output data recorded.   Recent Labs  12/24/15 0615 12/25/15 0340  HGB 10.7* 9.9*    Recent Labs  12/24/15 0615 12/25/15 0340  WBC 7.9 6.6  RBC 3.21* 2.97*  HCT 33.0* 30.8*  PLT 172 158    Recent Labs  12/24/15 0615  NA 139  K 3.9  CL 105  CO2 25  BUN 14  CREATININE 0.90  GLUCOSE 108*  CALCIUM 8.7*   No results for input(s): LABPT, INR in the last 72 hours.  Neurologically intact Neurovascular intact Sensation intact distally Intact pulses distally Dorsiflexion/Plantar flexion intact Incision: dressing C/D/I No cellulitis present Compartment soft  Assessment/Plan: 2 Days Post-Op Procedure(s) (LRB): TOTAL KNEE ARTHROPLASTY (Right) Advance diet Discharge home with home health likely this afternoon if pain is under control and he works well with PT.  Ok to stay another night if he needs to though. WBAT RLE ABLA-mild and stable Will increase flexeril to baseline dose at home D/C norco and added oxycodone   Fannie Knee 12/25/2015, 9:45 AM

## 2015-12-25 NOTE — Progress Notes (Signed)
Physical Therapy Treatment Patient Details Name: Gene Hill MRN: [id number] DOB: [birth year] Today's Date: 12/25/2015    History of Present Illness Gene Hill is an 75 y.o. male who was admitted 12/23/2015 for operative treatment of primary localized osteoarthritis right knee.   Pt underwent right TKA on 12/23/15.Marland Kitchen     PT Comments    Pt states pain better controlled than last night but cont's to c/o pain is 7/10.  Ambulated 83' with min guard.  Pt deferred stair training this visit due to pain.  Plan to complete 2nd visit.      Follow Up Recommendations  Home health PT;Supervision/Assistance - 24 hour     Equipment Recommendations  Rolling walker with 5" wheels    Recommendations for Other Services       Precautions / Restrictions Precautions Precautions: Fall;Knee Required Braces or Orthoses: Knee Immobilizer - Right Knee Immobilizer - Right: On when out of bed or walking Restrictions RLE Weight Bearing: Weight bearing as tolerated    Mobility  Bed Mobility               General bed mobility comments: seated in recliner upon arrival   Transfers Overall transfer level: Needs assistance Equipment used: Rolling walker (2 wheeled) Transfers: Sit to/from Stand Sit to Stand: Supervision Stand pivot transfers: Min guard       General transfer comment: cues to reinforce hadn placement.    Ambulation/Gait Ambulation/Gait assistance: Min guard Ambulation Distance (Feet): 75 Feet Assistive device: Rolling walker (2 wheeled) Gait Pattern/deviations: Step-through pattern;Decreased step length - left;Decreased stance time - right;Decreased weight shift to right;Antalgic Gait velocity: decreased   General Gait Details: cues for safe RW advancement as pt tends to push to far out front of body.  Distance limted by pain & fatigue   Stairs            Wheelchair Mobility    Modified Rankin (Stroke Patients Only)       Balance                                    Cognition Arousal/Alertness: Awake/alert Behavior During Therapy: WFL for tasks assessed/performed Overall Cognitive Status: Within Functional Limits for tasks assessed                      Exercises Total Joint Exercises Ankle Circles/Pumps: AROM;Both;10 reps Quad Sets: AROM;Both;10 reps Straight Leg Raises: AAROM;Strengthening;Right;10 reps    General Comments        Pertinent Vitals/Pain Pain Assessment: 0-10 Pain Score: 7  Pain Location: mid, lateral thigh Pain Descriptors / Indicators: Tightness;Throbbing Pain Intervention(s): Limited activity within patient's tolerance;Monitored during session;Repositioned;Patient requesting pain meds-RN notified;RN gave pain meds during session;Ice applied    Home Living                      Prior Function            PT Goals (current goals can now be found in the care plan section) Acute Rehab PT Goals Patient Stated Goal: to go home PT Goal Formulation: With patient Time For Goal Achievement: 12/31/15 Potential to Achieve Goals: Good Progress towards PT goals: Progressing toward goals    Frequency    7X/week      PT Plan Current plan remains appropriate    Co-evaluation  End of Session Equipment Utilized During Treatment: Gait belt;Right knee immobilizer Activity Tolerance: Patient limited by fatigue;Patient limited by pain Patient left: with call bell/phone within reach;in chair;with family/visitor present     Time: 0900-0930 PT Time Calculation (min) (ACUTE ONLY): 30 min  Charges:  $Gait Training: 23-37 mins                    G Codes:      Sena Hitch 12/25/2015, 12:43 PM   Sarajane Marek, PTA [phone] 12/25/2015

## 2015-12-26 ENCOUNTER — Encounter (HOSPITAL_COMMUNITY): Payer: Self-pay | Admitting: Orthopedic Surgery

## 2015-12-26 DIAGNOSIS — Z96651 Presence of right artificial knee joint: Secondary | ICD-10-CM | POA: Diagnosis not present

## 2015-12-26 DIAGNOSIS — M1711 Unilateral primary osteoarthritis, right knee: Secondary | ICD-10-CM | POA: Diagnosis not present

## 2015-12-26 LAB — CBC
HCT: 29.9 % — ABNORMAL LOW (ref 39.0–52.0)
HEMOGLOBIN: 9.6 g/dL — AB (ref 13.0–17.0)
MCH: 33.3 pg (ref 26.0–34.0)
MCHC: 32.1 g/dL (ref 30.0–36.0)
MCV: 103.8 fL — AB (ref 78.0–100.0)
Platelets: 141 10*3/uL — ABNORMAL LOW (ref 150–400)
RBC: 2.88 MIL/uL — AB (ref 4.22–5.81)
RDW: 15.9 % — ABNORMAL HIGH (ref 11.5–15.5)
WBC: 5.7 10*3/uL (ref 4.0–10.5)

## 2015-12-26 MED ORDER — OXYCODONE-ACETAMINOPHEN 5-325 MG PO TABS: 1.0000 | ORAL_TABLET | ORAL | 0 refills | Status: DC | PRN

## 2015-12-26 MED ORDER — VITAMIN D 1000 UNITS PO TABS
2000.0000 [IU] | ORAL_TABLET | Freq: Every day | ORAL | Status: DC
  Administered 2015-12-26: 2000 [IU] via ORAL
  Filled 2015-12-26: qty 2

## 2015-12-26 NOTE — Progress Notes (Signed)
Orthopedic Tech Progress Note Patient Details:  Gene Hill Dec 19, 1940 [id number]  Patient ID: [name], male   DOB: Mar 24, 1940, 75 y.o.   MRN: [id number] Applied cpm 0-90  Karolee Stamps 12/26/2015, 6:26 AM

## 2015-12-26 NOTE — Progress Notes (Signed)
Subjective: 3 Days Post-Op Procedure(s) (LRB): TOTAL KNEE ARTHROPLASTY (Right) Patient reports pain as mild and moderate.    Objective: Vital signs in last 24 hours: Temp:  [98.1 F (36.7 C)-99.4 F (37.4 C)] 98.1 F (36.7 C) (12/04 0507) Pulse Rate:  [84-95] 84 (12/04 0507) BP: (125-138)/(61-64) 125/61 (12/04 0507) SpO2:  [92 %-94 %] 94 % (12/04 0507)  Intake/Output from previous day: 12/03 0701 - 12/04 0700 In: 520 [P.O.:520] Out: 800 [Urine:800] Intake/Output this shift: No intake/output data recorded.   Recent Labs  12/24/15 0615 12/25/15 0340 12/26/15 0310  HGB 10.7* 9.9* 9.6*    Recent Labs  12/25/15 0340 12/26/15 0310  WBC 6.6 5.7  RBC 2.97* 2.88*  HCT 30.8* 29.9*  PLT 158 141*    Recent Labs  12/24/15 0615  NA 139  K 3.9  CL 105  CO2 25  BUN 14  CREATININE 0.90  GLUCOSE 108*  CALCIUM 8.7*   No results for input(s): LABPT, INR in the last 72 hours.  Neurovascular intact Sensation intact distally Intact pulses distally Dorsiflexion/Plantar flexion intact Incision: dressing C/D/I and dsg changed today No cellulitis present  Assessment/Plan: 3 Days Post-Op Procedure(s) (LRB): TOTAL KNEE ARTHROPLASTY (Right) Up with therapy Discharge home with home health  Chriss Czar 12/26/2015, 8:52 AM

## 2015-12-26 NOTE — Discharge Summary (Signed)
  PATIENT ID: Gene Hill        MRN:  [id number]          DOB/AGE: 06/13/1940 / 75 y.o.    DISCHARGE SUMMARY  ADMISSION DATE:    12/23/2015 DISCHARGE DATE:   12/26/2015   ADMISSION DIAGNOSIS: OA RIGHT KNEE    DISCHARGE DIAGNOSIS:  OA RIGHT KNEE    ADDITIONAL DIAGNOSIS: Active Problems:   Primary localized osteoarthritis of right knee  Past Medical History:  Diagnosis Date  . Arthritis   . 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 Blood dyscrasia    low WBC d/t multiple myeloma  . Blood transfusion   . Carotid bruit    3-11 carotid u/s was  (-)  . 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 GERD (gastroesophageal reflux disease)   . Headache   . Heart murmur   . HYPERLIPIDEMIA 05/03/2006  . HYPERTENSION 05/03/2006  . 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 ulcer duodenitis     PROCEDURE: Procedure(s): TOTAL KNEE ARTHROPLASTY Right on 12/23/2015  CONSULTS: pt/ot  HISTORY:  See H&P in chart  HOSPITAL COURSE:  Gene Hill is a 75 y.o. admitted on 12/23/2015 and found to have a diagnosis of OA RIGHT KNEE.  After appropriate laboratory studies were obtained  they were taken to the operating room on 12/23/2015 and underwent  Procedure(s): TOTAL KNEE ARTHROPLASTY  Right.   They were given perioperative antibiotics:  Anti-infectives    Start     Dose/Rate Route Frequency Ordered Stop   12/23/15 2200  vancomycin (VANCOCIN) IVPB 1000 mg/200 mL premix     1,000 mg 200 mL/hr over 60 Minutes Intravenous Every 12 hours 12/23/15 1651 12/23/15 2225   12/23/15 1000  vancomycin (VANCOCIN) IVPB 1000 mg/200 mL premix     1,000 mg 200 mL/hr over 60 Minutes Intravenous  Once 12/23/15 0748 12/23/15 1113    .  Tolerated the procedure well.   POD #1, allowed out of bed to a chair.  PT for ambulation and exercise program.   IV saline locked.  O2 discontionued.  Increased pain once block had worn off med changes.  POD #2, continued PT and ambulation. Continued pain control.  The remainder of the hospital course was dedicated to ambulation and strengthening.   The patient was discharged on 3 Days Post-Op in  Stable condition.  Blood products given:none  DIAGNOSTIC STUDIES: Recent vital signs:  Patient Vitals for the past 24 hrs:  BP Temp Temp src Pulse SpO2  12/26/15 0507 125/61 98.1 F (36.7 C) Oral 84 94 %  12/25/15 2111 138/64 99.4 F (37.4 C) Oral 95 92 %  12/25/15 1300 136/61 99 F (37.2 C) Oral 84 93 %       Recent laboratory studies:  Recent Labs  12/24/15 0615 12/25/15 0340 12/26/15 0310  WBC 7.9 6.6 5.7  HGB 10.7* 9.9* 9.6*  HCT 33.0* 30.8* 29.9*  PLT 172 158 141*    Recent Labs  12/24/15 0615  NA 139  K 3.9  CL 105  CO2 25  BUN 14  CREATININE 0.90  GLUCOSE 108*  CALCIUM 8.7*   Lab Results  Component Value Date   INR 1.02 12/12/2015   INR 4.14 (H) 04/10/2010   INR Sent out for confirmation 04/10/2010   PROTIME   Sent out for confirmation 04/10/2010     Recent Radiographic Studies :  No results found.  DISCHARGE INSTRUCTIONS:   DISCHARGE MEDICATIONS:     Medication List    STOP taking these medications   aspirin EC 325 MG tablet   traMADol 50 MG tablet Commonly known as:  ULTRAM     TAKE these medications   apixaban 2.5 MG Tabs tablet Commonly known as:  ELIQUIS Take 1 tablet (2.5 mg total) by mouth 2 (two) times daily.   cyclobenzaprine 5 MG tablet Commonly known as:  FLEXERIL Take 5 mg by mouth 3 (three) times daily as needed for muscle spasms.   Fish Oil 1200 MG Caps Take 1,200 mg by mouth daily.   ixazomib citrate 3 MG capsule Commonly known as:  NINLARO Take 1 capsule every 2 weeks on an empty stomach.   lidocaine 5 % ointment Commonly known as:  XYLOCAINE Apply 1 application topically 2 (two) times daily as needed. Mix w/ OTC Hydrocortisone  1 %   metoprolol 100 MG tablet Commonly known as:  LOPRESSOR Take 0.5 tablets (50 mg total) by mouth 2 (two) times daily.   multivitamin tablet Take 1 tablet by mouth daily.   oxyCODONE-acetaminophen 5-325 MG tablet Commonly known as:  ROXICET Take 1-2 tablets by mouth every 4 (four) hours as needed.   pantoprazole 40 MG tablet Commonly known as:  PROTONIX Take 1 tablet (40 mg total) by mouth daily.   POMALYST 3 MG capsule Generic drug:  pomalidomide Take 1 capsule by mouth daily. Take with water on days 1-21. Repeat every 28 days. AUTH#[id number]   PROBIOTIC PO Take 1 tablet by mouth daily.   Vitamin D3 2000 units capsule Take 2,000 Units by mouth daily.            Durable Medical Equipment        Start     Ordered   12/24/15 0916  For home use only DME Walker  Once    Question:  Patient needs a walker to treat with the following condition  Answer:  S/P TKR (total knee replacement)   12/24/15 0916   12/24/15 0914  DME Walker rolling  Once    Question:  Patient needs a walker to treat with the following condition  Answer:  Primary localized osteoarthritis of right knee   12/24/15 0916   12/23/15 1652  DME 3 n 1  Once     12/23/15 1651      FOLLOW UP VISIT:   Follow-up Information    CAFFREY JR,W D, MD. Schedule an appointment as soon as possible for a visit in 2 week(s).   Specialty:  Orthopedic Surgery Contact information: [address] [place] [address] [phone]        [facility] - [facility] Follow up.   Why:  RW has been delivered to your room Contact information: [address] [place] [address] [phone]        [facility] Follow up.   Why:  HHPT has been ordered by your MD. A representative will contact you within 24-48 hours after your discharge to arrange initial contact.  Contact information: [address] [place] [address] [phone]            DISPOSITION:   Home  CONDITION:  Stable   Joshua Chadwell, PA-C  12/26/2015 8:58 AM   

## 2015-12-26 NOTE — Progress Notes (Signed)
Physical Therapy Treatment Patient Details Name: Gene Hill MRN: [id number] DOB: April 05, 1940 Today's Date: 12/26/2015    History of Present Illness Gene Hill is an 75 y.o. male who was admitted 12/23/2015 for operative treatment of primary localized osteoarthritis right knee.   Pt underwent right TKA on 12/23/15.Marland Kitchen     PT Comments    Patient tolerated increased gait distance and stair training this session. Reviewed positioning, precautions, and HEP. Current plan remains appropriate.   Follow Up Recommendations  Home health PT;Supervision/Assistance - 24 hour     Equipment Recommendations  Rolling walker with 5" wheels    Recommendations for Other Services       Precautions / Restrictions Precautions Precautions: Fall;Knee Required Braces or Orthoses: Knee Immobilizer - Right Knee Immobilizer - Right: On when out of bed or walking Restrictions Weight Bearing Restrictions: Yes RLE Weight Bearing: Weight bearing as tolerated    Mobility  Bed Mobility               General bed mobility comments: pt in chair upon arrival  Transfers Overall transfer level: Needs assistance Equipment used: Rolling walker (2 wheeled) Transfers: Sit to/from Stand Sit to Stand: Min guard         General transfer comment: from recliner and BSC; cues for hand placement  Ambulation/Gait Ambulation/Gait assistance: Min guard Ambulation Distance (Feet): 150 Feet Assistive device: Rolling walker (2 wheeled) Gait Pattern/deviations: Step-to pattern;Step-through pattern;Decreased stance time - right;Decreased step length - left;Decreased weight shift to right;Antalgic Gait velocity: decreased   General Gait Details: cues for posture, proximity of RW, and R knee extension during stance phase; pt with improved step through pattern with increased distance   Stairs Stairs: Yes Stairs assistance: Min guard Stair Management: One rail Right;Sideways;Step to pattern Number of Stairs:  7 General stair comments: pt with carry over of sequencing; min guard for safety; heavy reliance on rail/UE support  Wheelchair Mobility    Modified Rankin (Stroke Patients Only)       Balance                                    Cognition Arousal/Alertness: Awake/alert Behavior During Therapy: WFL for tasks assessed/performed Overall Cognitive Status: Within Functional Limits for tasks assessed                      Exercises Total Joint Exercises Quad Sets: AROM;Right;10 reps Heel Slides: AROM;Right;10 reps Straight Leg Raises: AAROM;Right;5 reps Goniometric ROM: ~70 degrees flexion    General Comments General comments (skin integrity, edema, etc.): daughter present for session      Pertinent Vitals/Pain Pain Assessment: Faces Faces Pain Scale: Hurts little more Pain Location: R knee Pain Descriptors / Indicators: Grimacing;Guarding;Sore Pain Intervention(s): Limited activity within patient's tolerance;Monitored during session;Premedicated before session;Repositioned    Home Living                      Prior Function            PT Goals (current goals can now be found in the care plan section) Acute Rehab PT Goals Patient Stated Goal: to go home PT Goal Formulation: With patient Time For Goal Achievement: 12/31/15 Potential to Achieve Goals: Good Progress towards PT goals: Progressing toward goals    Frequency    7X/week      PT Plan Current plan remains appropriate  Co-evaluation             End of Session Equipment Utilized During Treatment: Gait belt;Right knee immobilizer Activity Tolerance: Patient tolerated treatment well Patient left: with call bell/phone within reach;with family/visitor present;in chair     Time: [id number] PT Time Calculation (min) (ACUTE ONLY): 36 min  Charges:  $Gait Training: 8-22 mins $Therapeutic Exercise: 8-22 mins                    G Codes:      Salina April, PTA Pager: [phone]   12/26/2015, 8:48 AM

## 2015-12-26 NOTE — Progress Notes (Signed)
Pt ready for discharge. Education/instructions reviewed with pt and family members, and all questions/concerns addressed. IV removed and belongings gathered. Pt will be transported out via wheelchair to wife's vehicle. Will continue to monitor

## 2015-12-27 DIAGNOSIS — Z471 Aftercare following joint replacement surgery: Secondary | ICD-10-CM | POA: Diagnosis not present

## 2015-12-27 DIAGNOSIS — Z96651 Presence of right artificial knee joint: Secondary | ICD-10-CM | POA: Diagnosis not present

## 2015-12-27 DIAGNOSIS — Z87891 Personal history of nicotine dependence: Secondary | ICD-10-CM | POA: Diagnosis not present

## 2015-12-27 DIAGNOSIS — I2510 Atherosclerotic heart disease of native coronary artery without angina pectoris: Secondary | ICD-10-CM | POA: Diagnosis not present

## 2015-12-27 DIAGNOSIS — M150 Primary generalized (osteo)arthritis: Secondary | ICD-10-CM | POA: Diagnosis not present

## 2015-12-28 ENCOUNTER — Other Ambulatory Visit: Payer: Self-pay

## 2015-12-28 DIAGNOSIS — M1711 Unilateral primary osteoarthritis, right knee: Secondary | ICD-10-CM | POA: Diagnosis not present

## 2015-12-28 MED ORDER — POMALYST 3 MG PO CAPS: ORAL_CAPSULE | ORAL | 0 refills | Status: DC

## 2015-12-30 ENCOUNTER — Telehealth: Payer: Self-pay | Admitting: *Deleted

## 2015-12-30 NOTE — Telephone Encounter (Signed)
Patient's wife is wanting to get his pom/ninlaro back on the same cycle. Yesterday was CD1 for Pomalyst, but CD15 for Ninlaro. The medications became off schedule due to surgery and issue with obtaining drug with financial assistance.  Spoke to Dr Marin Olp. He wants patient to continue this cycle of Pomalyst. When he restarts his next Pomalyst cycle, he can restart the Ninlaro. This will return both medications to the same cycle.   Reviewed this with Bartolo Darter. She is in understanding; confirmed with teach back.

## 2016-01-02 ENCOUNTER — Other Ambulatory Visit: Payer: Self-pay | Admitting: Internal Medicine

## 2016-01-02 DIAGNOSIS — Z96651 Presence of right artificial knee joint: Secondary | ICD-10-CM | POA: Diagnosis not present

## 2016-01-04 DIAGNOSIS — Z471 Aftercare following joint replacement surgery: Secondary | ICD-10-CM | POA: Diagnosis not present

## 2016-01-04 DIAGNOSIS — I2510 Atherosclerotic heart disease of native coronary artery without angina pectoris: Secondary | ICD-10-CM | POA: Diagnosis not present

## 2016-01-04 DIAGNOSIS — Z96651 Presence of right artificial knee joint: Secondary | ICD-10-CM | POA: Diagnosis not present

## 2016-01-04 DIAGNOSIS — Z87891 Personal history of nicotine dependence: Secondary | ICD-10-CM | POA: Diagnosis not present

## 2016-01-04 DIAGNOSIS — M150 Primary generalized (osteo)arthritis: Secondary | ICD-10-CM | POA: Diagnosis not present

## 2016-01-05 DIAGNOSIS — M25561 Pain in right knee: Secondary | ICD-10-CM | POA: Diagnosis not present

## 2016-01-09 DIAGNOSIS — Z471 Aftercare following joint replacement surgery: Secondary | ICD-10-CM | POA: Diagnosis not present

## 2016-01-09 DIAGNOSIS — I2510 Atherosclerotic heart disease of native coronary artery without angina pectoris: Secondary | ICD-10-CM | POA: Diagnosis not present

## 2016-01-09 DIAGNOSIS — M150 Primary generalized (osteo)arthritis: Secondary | ICD-10-CM | POA: Diagnosis not present

## 2016-01-09 DIAGNOSIS — Z87891 Personal history of nicotine dependence: Secondary | ICD-10-CM | POA: Diagnosis not present

## 2016-01-09 DIAGNOSIS — Z96651 Presence of right artificial knee joint: Secondary | ICD-10-CM | POA: Diagnosis not present

## 2016-01-11 ENCOUNTER — Other Ambulatory Visit (HOSPITAL_BASED_OUTPATIENT_CLINIC_OR_DEPARTMENT_OTHER): Payer: PPO

## 2016-01-11 ENCOUNTER — Ambulatory Visit (HOSPITAL_BASED_OUTPATIENT_CLINIC_OR_DEPARTMENT_OTHER): Payer: PPO | Admitting: Hematology & Oncology

## 2016-01-11 VITALS — BP 157/83 | HR 82 | Temp 99.1°F | Wt 211.5 lb

## 2016-01-11 DIAGNOSIS — Z96651 Presence of right artificial knee joint: Secondary | ICD-10-CM | POA: Diagnosis not present

## 2016-01-11 DIAGNOSIS — Z87891 Personal history of nicotine dependence: Secondary | ICD-10-CM | POA: Diagnosis not present

## 2016-01-11 DIAGNOSIS — I2510 Atherosclerotic heart disease of native coronary artery without angina pectoris: Secondary | ICD-10-CM | POA: Diagnosis not present

## 2016-01-11 DIAGNOSIS — M150 Primary generalized (osteo)arthritis: Secondary | ICD-10-CM | POA: Diagnosis not present

## 2016-01-11 DIAGNOSIS — C9000 Multiple myeloma not having achieved remission: Secondary | ICD-10-CM

## 2016-01-11 DIAGNOSIS — Z471 Aftercare following joint replacement surgery: Secondary | ICD-10-CM | POA: Diagnosis not present

## 2016-01-11 DIAGNOSIS — C9002 Multiple myeloma in relapse: Secondary | ICD-10-CM

## 2016-01-11 LAB — COMPREHENSIVE METABOLIC PANEL
ALT: 21 U/L (ref 0–55)
ANION GAP: 10 meq/L (ref 3–11)
AST: 14 U/L (ref 5–34)
Albumin: 3.6 g/dL (ref 3.5–5.0)
Alkaline Phosphatase: 68 U/L (ref 40–150)
BUN: 11.1 mg/dL (ref 7.0–26.0)
CALCIUM: 9.5 mg/dL (ref 8.4–10.4)
CHLORIDE: 102 meq/L (ref 98–109)
CO2: 27 meq/L (ref 22–29)
Creatinine: 0.9 mg/dL (ref 0.7–1.3)
EGFR: 84 mL/min/{1.73_m2} — ABNORMAL LOW (ref >90)
Glucose: 107 mg/dl (ref 70–140)
POTASSIUM: 3.9 meq/L (ref 3.5–5.1)
Sodium: 139 mEq/L (ref 136–145)
Total Bilirubin: 0.38 mg/dL (ref 0.20–1.20)
Total Protein: 7.8 g/dL (ref 6.4–8.3)

## 2016-01-11 LAB — CBC WITH DIFFERENTIAL (CANCER CENTER ONLY)
BASO#: 0 10*3/uL (ref 0.0–0.2)
BASO%: 0.2 % (ref 0.0–2.0)
EOS%: 1.3 % (ref 0.0–7.0)
Eosinophils Absolute: 0.1 10*3/uL (ref 0.0–0.5)
HCT: 34.5 % — ABNORMAL LOW (ref 38.7–49.9)
HGB: 11.4 g/dL — ABNORMAL LOW (ref 13.0–17.1)
LYMPH#: 1.2 10*3/uL (ref 0.9–3.3)
LYMPH%: 22.4 % (ref 14.0–48.0)
MCH: 34 pg — ABNORMAL HIGH (ref 28.0–33.4)
MCHC: 33 g/dL (ref 32.0–35.9)
MCV: 103 fL — AB (ref 82–98)
MONO#: 1 10*3/uL — AB (ref 0.1–0.9)
MONO%: 18.9 % — ABNORMAL HIGH (ref 0.0–13.0)
NEUT#: 3.1 10*3/uL (ref 1.5–6.5)
NEUT%: 57.2 % (ref 40.0–80.0)
PLATELETS: 214 10*3/uL (ref 145–400)
RBC: 3.35 10*6/uL — AB (ref 4.20–5.70)
RDW: 14.9 % (ref 11.1–15.7)
WBC: 5.4 10*3/uL (ref 4.0–10.0)

## 2016-01-11 NOTE — Progress Notes (Signed)
Hematology and Oncology Follow Up Visit  Gene Gene Hill [id number] Aug 30, 1940 75 y.o. 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  Gene Gene Hill is back for followup. He had his right knee surgery 3 weeks ago. He got through this without any problems. He was hospitalized for a couple days. He has been doing some physical therapy at home.  The knee swelling is improving. He still has some swelling in the right leg.  There's been no problems with bleeding. He's had no issues with infections.   He recently restarted the Pomalidomide.   We last saw him in November, his monoclonal spike was 0.7 g/dL. His IgA level was 1288 mg/dL. His Kappa light chain was 5.3 mg/dL.   Again, he is really done well postop. He really has a strong constitution.   He has been wearing some compression socks for the right leg. This is made the swelling improved. Also has given him a little bit more strength in the right leg.   It will be a very quiet Christmas for he and his wife. His wife has been having some issues which she seems to be getting through.   He has had no cough. He has had no shortness of breath. He has had no headache.   Overall, I would say that his performance status is ECOG 1.  Medications:  Current Outpatient Prescriptions:  .  apixaban (ELIQUIS) 2.5 MG TABS tablet, Take 1 tablet (2.5 mg total) by mouth 2 (two) times daily., Disp: 60 tablet, Rfl: 0 .  Cholecalciferol (VITAMIN D3) 2000 units capsule, Take 2,000 Units by mouth daily. , Disp: , Rfl:  .  cyclobenzaprine (FLEXERIL) 5 MG tablet, Take 5 mg by mouth 3 (three) times daily as needed for muscle spasms., Disp: , Rfl:  .  ixazomib citrate (NINLARO) 3 MG capsule, Take 1 capsule every 2 weeks on an empty  stomach., Disp: 4 capsule, Rfl: 6 .  lidocaine (XYLOCAINE) 5 % ointment, Apply 1 application topically 2 (two) times daily as needed. Mix w/ OTC Hydrocortisone 1 %, Disp: 35.44 g, Rfl: 1 .  metoprolol (LOPRESSOR) 100 MG tablet, Take 0.5 tablets (50 mg total) by mouth 2 (two) times daily., Disp: 90 tablet, Rfl: 1 .  oxyCODONE-acetaminophen (ROXICET) 5-325 MG tablet, Take 1-2 tablets by mouth every 4 (four) hours as needed., Disp: 84 tablet, Rfl: 0 .  pantoprazole (PROTONIX) 40 MG tablet, Take 1 tablet (40 mg total) by mouth daily., Disp: 90 tablet, Rfl: 3 .  POMALYST 3 MG capsule, Take 1 capsule by mouth daily. Take with water on days 1-21. Repeat every 28 days. [id number], Disp: 21 capsule, Rfl: 0 .  Probiotic Product (PROBIOTIC PO), Take 1 tablet by mouth daily. , Disp: , Rfl:  .  Multiple Vitamin (MULTIVITAMIN) tablet, Take 1 tablet by mouth daily., Disp: , Rfl:  .  Omega-3 Fatty Acids (FISH OIL) 1200 MG CAPS, Take 1,200 mg by mouth daily., Disp: , Rfl:   Allergies: No Known Allergies  Past Medical History, Surgical history, Social history, and Family History were reviewed and updated.  Review of Systems: As above  Physical Exam:  weight is 211 lb  8 oz (95.9 kg). His oral temperature is 99.1 F (37.3 C). His blood pressure is 157/83 (abnormal) and his pulse is 82.  Well-developed and well-nourished white gentleman. Head and neck exam shows no ocular or oral lesions. He has no palpable lymph nodes. Lungs are clear. Cardiac exam regular rate and rhythm with no murmurs rubs or bruits. Abdomen is soft. He's mildly obese. He has good bowel sounds. There is no fluid wave. There is no palpable liver or spleen tip. Back exam shows no tenderness over the spine ribs or hips. Extremities shows the healing surgical scars on the right knee. He has some slight swelling of the right leg. He has negative Homans sign for either leg. No venous cords noted in either leg. Skin exam no rashes. Neurological exam  is nonfocal.  Lab Results  Component Value Date   WBC 5.4 01/11/2016   HGB 11.4 (L) 01/11/2016   HCT 34.5 (L) 01/11/2016   MCV 103 (H) 01/11/2016   PLT 214 01/11/2016     Chemistry      Component Value Date/Time   NA 139 12/24/2015 0615   NA 136 11/23/2015 1356   NA 141 05/13/2015 0818   K 3.9 12/24/2015 0615   K 3.4 11/23/2015 1356   K 4.2 05/13/2015 0818   CL 105 12/24/2015 0615   CL 100 11/23/2015 1356   CO2 25 12/24/2015 0615   CO2 27 11/23/2015 1356   CO2 24 05/13/2015 0818   BUN 14 12/24/2015 0615   BUN 11 11/23/2015 1356   BUN 18.3 05/13/2015 0818   CREATININE 0.90 12/24/2015 0615   CREATININE 0.8 11/23/2015 1356   CREATININE 0.9 05/13/2015 0818      Component Value Date/Time   CALCIUM 8.7 (L) 12/24/2015 0615   CALCIUM 9.6 11/23/2015 1356   CALCIUM 9.1 05/13/2015 0818   ALKPHOS 45 12/12/2015 1055   ALKPHOS 46 11/23/2015 1356   ALKPHOS 40 05/13/2015 0818   AST 23 12/12/2015 1055   AST 27 11/23/2015 1356   AST 19 05/13/2015 0818   ALT 20 12/12/2015 1055   ALT 36 11/23/2015 1356   ALT 26 05/13/2015 0818   BILITOT 0.3 12/12/2015 1055   BILITOT 0.60 11/23/2015 1356   BILITOT <0.30 05/13/2015 0818       Impression and Plan: Gene Gene Hill is a 75 year old gentleman with history of IgA kappa myeloma. 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 He was treated with standard therapy and got into remission. 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  His relapsed myeloma is certainly high-risk given the chromosomal abnormalities..  I am so grateful that he got through his knee surgery. He had no problems with the knee surgery. There were no complications from this.  He now is back on the Pomalidomide and Ninlaro.  We will plan to see him back in one month. We see him back, and we will give him Zometa.  He really has done well this year. Hopefully, next year will be a nice quiet uniforms a week and focus on his organic  gardening.   Gene Napoleon, MD 12/20/20173:25 PM

## 2016-01-12 LAB — IGG, IGA, IGM
IGG (IMMUNOGLOBIN G), SERUM: 500 mg/dL — AB (ref 700–1600)
IgA, Qn, Serum: 1375 mg/dL — ABNORMAL HIGH (ref 61–437)
IgM, Qn, Serum: 13 mg/dL — ABNORMAL LOW (ref 15–143)

## 2016-01-12 LAB — KAPPA/LAMBDA LIGHT CHAINS
IG KAPPA FREE LIGHT CHAIN: 59.2 mg/L — AB (ref 3.3–19.4)
Ig Lambda Free Light Chain: 12.6 mg/L (ref 5.7–26.3)
Kappa/Lambda FluidC Ratio: 4.7 — ABNORMAL HIGH (ref 0.26–1.65)

## 2016-01-12 LAB — LACTATE DEHYDROGENASE: LDH: 149 U/L (ref 125–245)

## 2016-01-17 DIAGNOSIS — Z87891 Personal history of nicotine dependence: Secondary | ICD-10-CM | POA: Diagnosis not present

## 2016-01-17 DIAGNOSIS — I2510 Atherosclerotic heart disease of native coronary artery without angina pectoris: Secondary | ICD-10-CM | POA: Diagnosis not present

## 2016-01-17 DIAGNOSIS — Z471 Aftercare following joint replacement surgery: Secondary | ICD-10-CM | POA: Diagnosis not present

## 2016-01-17 DIAGNOSIS — Z96651 Presence of right artificial knee joint: Secondary | ICD-10-CM | POA: Diagnosis not present

## 2016-01-17 DIAGNOSIS — M150 Primary generalized (osteo)arthritis: Secondary | ICD-10-CM | POA: Diagnosis not present

## 2016-01-17 LAB — PROTEIN ELECTROPHORESIS, SERUM, WITH REFLEX
A/G Ratio: 0.9 (ref 0.7–1.7)
ALPHA 1: 0.2 g/dL (ref 0.0–0.4)
ALPHA 2: 0.9 g/dL (ref 0.4–1.0)
Albumin: 3.5 g/dL (ref 2.9–4.4)
BETA: 1.2 g/dL (ref 0.7–1.3)
GAMMA GLOBULIN: 1.4 g/dL (ref 0.4–1.8)
Globulin, Total: 3.7 g/dL (ref 2.2–3.9)
Interpretation(See Below): 0
M-SPIKE, %: 0.6 g/dL — AB
Total Protein: 7.2 g/dL (ref 6.0–8.5)

## 2016-01-18 ENCOUNTER — Telehealth: Payer: Self-pay | Admitting: *Deleted

## 2016-01-18 NOTE — Telephone Encounter (Addendum)
Patient's wife aware of results  ----- Message from [name], MD sent at 01/18/2016  6:31 AM EST ----- Call - myeloma level is holding steady at 0.6!! This is wonderful!!  pete

## 2016-01-19 DIAGNOSIS — Z87891 Personal history of nicotine dependence: Secondary | ICD-10-CM | POA: Diagnosis not present

## 2016-01-19 DIAGNOSIS — Z471 Aftercare following joint replacement surgery: Secondary | ICD-10-CM | POA: Diagnosis not present

## 2016-01-19 DIAGNOSIS — M150 Primary generalized (osteo)arthritis: Secondary | ICD-10-CM | POA: Diagnosis not present

## 2016-01-19 DIAGNOSIS — Z96651 Presence of right artificial knee joint: Secondary | ICD-10-CM | POA: Diagnosis not present

## 2016-01-19 DIAGNOSIS — I2510 Atherosclerotic heart disease of native coronary artery without angina pectoris: Secondary | ICD-10-CM | POA: Diagnosis not present

## 2016-01-23 DIAGNOSIS — M150 Primary generalized (osteo)arthritis: Secondary | ICD-10-CM | POA: Diagnosis not present

## 2016-01-23 DIAGNOSIS — Z87891 Personal history of nicotine dependence: Secondary | ICD-10-CM | POA: Diagnosis not present

## 2016-01-23 DIAGNOSIS — Z471 Aftercare following joint replacement surgery: Secondary | ICD-10-CM | POA: Diagnosis not present

## 2016-01-23 DIAGNOSIS — Z96651 Presence of right artificial knee joint: Secondary | ICD-10-CM | POA: Diagnosis not present

## 2016-01-23 DIAGNOSIS — I2510 Atherosclerotic heart disease of native coronary artery without angina pectoris: Secondary | ICD-10-CM | POA: Diagnosis not present

## 2016-01-24 DIAGNOSIS — M25561 Pain in right knee: Secondary | ICD-10-CM | POA: Diagnosis not present

## 2016-02-13 ENCOUNTER — Other Ambulatory Visit: Payer: Self-pay | Admitting: *Deleted

## 2016-02-13 MED ORDER — POMALYST 3 MG PO CAPS: ORAL_CAPSULE | ORAL | 0 refills | Status: DC

## 2016-02-16 ENCOUNTER — Other Ambulatory Visit (HOSPITAL_BASED_OUTPATIENT_CLINIC_OR_DEPARTMENT_OTHER): Payer: PPO

## 2016-02-16 ENCOUNTER — Ambulatory Visit (HOSPITAL_BASED_OUTPATIENT_CLINIC_OR_DEPARTMENT_OTHER): Payer: PPO | Admitting: Hematology & Oncology

## 2016-02-16 ENCOUNTER — Ambulatory Visit (HOSPITAL_BASED_OUTPATIENT_CLINIC_OR_DEPARTMENT_OTHER): Payer: PPO

## 2016-02-16 VITALS — BP 132/67 | HR 72 | Temp 97.8°F | Resp 18 | Wt 213.0 lb

## 2016-02-16 DIAGNOSIS — C9000 Multiple myeloma not having achieved remission: Secondary | ICD-10-CM | POA: Diagnosis not present

## 2016-02-16 DIAGNOSIS — C9002 Multiple myeloma in relapse: Secondary | ICD-10-CM

## 2016-02-16 DIAGNOSIS — E349 Endocrine disorder, unspecified: Secondary | ICD-10-CM

## 2016-02-16 LAB — CBC WITH DIFFERENTIAL (CANCER CENTER ONLY)
BASO#: 0 10*3/uL (ref 0.0–0.2)
BASO%: 1 % (ref 0.0–2.0)
EOS%: 6.8 % (ref 0.0–7.0)
Eosinophils Absolute: 0.2 10*3/uL (ref 0.0–0.5)
HEMATOCRIT: 38 % — AB (ref 38.7–49.9)
HGB: 12.2 g/dL — ABNORMAL LOW (ref 13.0–17.1)
LYMPH#: 1.2 10*3/uL (ref 0.9–3.3)
LYMPH%: 38.8 % (ref 14.0–48.0)
MCH: 33.2 pg (ref 28.0–33.4)
MCHC: 32.1 g/dL (ref 32.0–35.9)
MCV: 104 fL — AB (ref 82–98)
MONO#: 0.9 10*3/uL (ref 0.1–0.9)
MONO%: 29.1 % — ABNORMAL HIGH (ref 0.0–13.0)
NEUT#: 0.8 10*3/uL — ABNORMAL LOW (ref 1.5–6.5)
NEUT%: 24.3 % — AB (ref 40.0–80.0)
Platelets: 114 10*3/uL — ABNORMAL LOW (ref 145–400)
RBC: 3.67 10*6/uL — ABNORMAL LOW (ref 4.20–5.70)
RDW: 15.5 % (ref 11.1–15.7)
WBC: 3.1 10*3/uL — ABNORMAL LOW (ref 4.0–10.0)

## 2016-02-16 LAB — CMP (CANCER CENTER ONLY)
ALK PHOS: 48 U/L (ref 26–84)
ALT: 21 U/L (ref 10–47)
AST: 21 U/L (ref 11–38)
Albumin: 3.6 g/dL (ref 3.3–5.5)
BUN, Bld: 10 mg/dL (ref 7–22)
CALCIUM: 9.7 mg/dL (ref 8.0–10.3)
CHLORIDE: 103 meq/L (ref 98–108)
CO2: 27 mEq/L (ref 18–33)
Creat: 0.9 mg/dl (ref 0.6–1.2)
GLUCOSE: 99 mg/dL (ref 73–118)
POTASSIUM: 3.6 meq/L (ref 3.3–4.7)
Sodium: 141 mEq/L (ref 128–145)
TOTAL PROTEIN: 7.6 g/dL (ref 6.4–8.1)
Total Bilirubin: 0.7 mg/dl (ref 0.20–1.60)

## 2016-02-16 MED ORDER — ZOLEDRONIC ACID 4 MG/100ML IV SOLN
4.0000 mg | Freq: Once | INTRAVENOUS | Status: AC
  Administered 2016-02-16: 4 mg via INTRAVENOUS
  Filled 2016-02-16: qty 100

## 2016-02-16 NOTE — Progress Notes (Signed)
Hematology and Oncology Follow Up Visit  Gene Hill:[id number] Nov 04, 1940 76 y.o. 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 is back for followup. He really looks quite good. The right knee has recovered very nicely from his surgery. He completed his physical therapy couple weeks ago.  His myeloma studies have been stable. His last M spike back in December was 0.6 g/dL. His IgA level was 1375 milligrams per deciliter. His Kappa Light chain was 5.9 mg/dL.  His appetite has been good. He's had no nausea or vomiting. He's had no cough. He's had no shortness of breath. He's had no rashes. He's had no leg swelling.  He continues on aspirin as a thrombotic prophylactic.  Overall, I would say that his performance status is ECOG 1.  Medications:  Current Outpatient Prescriptions:  .  aspirin 325 MG tablet, Take 325 mg by mouth daily., Disp: , Rfl:  .  Cholecalciferol (VITAMIN D3) 2000 units capsule, Take 2,000 Units by mouth daily. , Disp: , Rfl:  .  cyclobenzaprine (FLEXERIL) 5 MG tablet, Take 5 mg by mouth 3 (three) times daily as needed for muscle spasms., Disp: , Rfl:  .  ixazomib citrate (NINLARO) 3 MG capsule, Take 1 capsule every 2 weeks on an empty stomach., Disp: 4 capsule, Rfl: 6 .  lidocaine (XYLOCAINE) 5 % ointment, Apply 1 application topically 2 (two) times daily as needed. Mix w/ OTC Hydrocortisone 1 %, Disp: 35.44 g, Rfl: 1 .  metoprolol (LOPRESSOR) 100 MG tablet, Take 0.5 tablets (50 mg total) by mouth 2 (two) times daily., Disp: 90 tablet, Rfl: 1 .  Multiple Vitamin (MULTIVITAMIN) tablet, Take 1 tablet by mouth daily., Disp: , Rfl:  .  Omega-3 Fatty Acids (FISH OIL) 1200 MG CAPS, Take 1,200 mg by mouth daily., Disp: , Rfl:  .   pantoprazole (PROTONIX) 40 MG tablet, Take 1 tablet (40 mg total) by mouth daily., Disp: 90 tablet, Rfl: 3 .  POMALYST 3 MG capsule, Take 1 capsule by mouth daily. Take with water on days 1-21. Repeat every 28 days. [id number], Disp: 21 capsule, Rfl: 0 .  Probiotic Product (PROBIOTIC PO), Take 1 tablet by mouth daily. , Disp: , Rfl:   Allergies: No Known Allergies  Past Medical History, Surgical history, Social history, and Family History were reviewed and updated.  Review of Systems: As above  Physical Exam:  weight is 213 lb (96.6 kg). His oral temperature is 97.8 F (36.6 C). His blood pressure is 132/67 and his pulse is 72. His respiration is 18 and oxygen saturation is 97%.  Well-developed and well-nourished white gentleman. Head and neck exam shows no ocular or oral lesions. He has no palpable lymph nodes. Lungs are clear. Cardiac exam regular rate and rhythm with no murmurs rubs or bruits. Abdomen is soft. He's mildly obese. He has good bowel sounds. There is no fluid wave. There is no palpable liver or spleen tip. Back exam shows no tenderness over the spine ribs or hips. Extremities shows the healing surgical scars on the right knee. He has some slight swelling of the right leg. He has negative Homans sign for  either leg. No venous cords noted in either leg. Skin exam no rashes. Neurological exam is nonfocal.  Lab Results  Component Value Date   WBC 3.1 (L) 02/16/2016   HGB 12.2 (L) 02/16/2016   HCT 38.0 (L) 02/16/2016   MCV 104 (H) 02/16/2016   PLT 114 (L) 02/16/2016     Chemistry      Component Value Date/Time   NA 141 02/16/2016 1423   NA 139 01/11/2016 1417   K 3.6 02/16/2016 1423   K 3.9 01/11/2016 1417   CL 103 02/16/2016 1423   CO2 27 02/16/2016 1423   CO2 27 01/11/2016 1417   BUN 10 02/16/2016 1423   BUN 11.1 01/11/2016 1417   CREATININE 0.9 02/16/2016 1423   CREATININE 0.9 01/11/2016 1417      Component Value Date/Time   CALCIUM 9.7 02/16/2016 1423    CALCIUM 9.5 01/11/2016 1417   ALKPHOS 48 02/16/2016 1423   ALKPHOS 68 01/11/2016 1417   AST 21 02/16/2016 1423   AST 14 01/11/2016 1417   ALT 21 02/16/2016 1423   ALT 21 01/11/2016 1417   BILITOT 0.70 02/16/2016 1423   BILITOT 0.38 01/11/2016 1417       Impression and Plan: Gene Hill is a 76 year old gentleman with history of IgA kappa myeloma. 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 He was treated with standard therapy and got into remission. 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  His relapsed myeloma is certainly high-risk given the chromosomal abnormalities..  I am so grateful that he got through his knee surgery. He had no problems with the knee surgery. There were no complications from this.  We will go ahead and proceed with his Zometa today. He will then get his next dose in April.  I think that as long as [name] is stabilizing, I don't think we have to make any changes with his protocol.  I will plan to see him back in another 6 weeks.   [name], MD 1/25/20183:22 PM

## 2016-02-16 NOTE — Patient Instructions (Signed)

## 2016-02-17 LAB — KAPPA/LAMBDA LIGHT CHAINS
IG KAPPA FREE LIGHT CHAIN: 56.9 mg/L — AB (ref 3.3–19.4)
IG LAMBDA FREE LIGHT CHAIN: 12.4 mg/L (ref 5.7–26.3)
Kappa/Lambda FluidC Ratio: 4.59 — ABNORMAL HIGH (ref 0.26–1.65)

## 2016-02-17 LAB — IGG, IGA, IGM
IGM (IMMUNOGLOBIN M), SRM: 13 mg/dL — AB (ref 15–143)
IgA, Qn, Serum: 1381 mg/dL — ABNORMAL HIGH (ref 61–437)
IgG, Qn, Serum: 524 mg/dL — ABNORMAL LOW (ref 700–1600)

## 2016-02-21 LAB — PROTEIN ELECTROPHORESIS, SERUM, WITH REFLEX
A/G Ratio: 0.9 (ref 0.7–1.7)
ALBUMIN: 3.5 g/dL (ref 2.9–4.4)
ALPHA 1: 0.2 g/dL (ref 0.0–0.4)
ALPHA 2: 0.9 g/dL (ref 0.4–1.0)
Beta: 1 g/dL (ref 0.7–1.3)
GAMMA GLOBULIN: 1.6 g/dL (ref 0.4–1.8)
Globulin, Total: 3.8 g/dL (ref 2.2–3.9)
INTERPRETATION(SEE BELOW): 0
M-Spike, %: 0.7 g/dL — ABNORMAL HIGH
Total Protein: 7.3 g/dL (ref 6.0–8.5)

## 2016-03-06 ENCOUNTER — Other Ambulatory Visit: Payer: Self-pay | Admitting: *Deleted

## 2016-03-06 MED ORDER — POMALYST 3 MG PO CAPS: ORAL_CAPSULE | ORAL | 0 refills | Status: DC

## 2016-03-13 DIAGNOSIS — M25561 Pain in right knee: Secondary | ICD-10-CM | POA: Diagnosis not present

## 2016-03-21 ENCOUNTER — Encounter: Payer: PPO | Admitting: Internal Medicine

## 2016-03-22 ENCOUNTER — Ambulatory Visit (INDEPENDENT_AMBULATORY_CARE_PROVIDER_SITE_OTHER): Payer: PPO | Admitting: Internal Medicine

## 2016-03-22 ENCOUNTER — Encounter: Payer: Self-pay | Admitting: Internal Medicine

## 2016-03-22 VITALS — BP 122/76 | HR 76 | Temp 97.6°F | Resp 14 | Ht 67.0 in | Wt 218.4 lb

## 2016-03-22 DIAGNOSIS — R739 Hyperglycemia, unspecified: Secondary | ICD-10-CM | POA: Diagnosis not present

## 2016-03-22 DIAGNOSIS — Z0000 Encounter for general adult medical examination without abnormal findings: Secondary | ICD-10-CM | POA: Diagnosis not present

## 2016-03-22 LAB — HEMOGLOBIN A1C: Hgb A1c MFr Bld: 6.1 % (ref 4.6–6.5)

## 2016-03-22 MED ORDER — TRAMADOL HCL 50 MG PO TABS: 25.0000 mg | ORAL_TABLET | Freq: Two times a day (BID) | ORAL | 0 refills | Status: DC | PRN

## 2016-03-22 MED ORDER — CYCLOBENZAPRINE HCL 10 MG PO TABS: 10.0000 mg | ORAL_TABLET | Freq: Every evening | ORAL | 0 refills | Status: DC | PRN

## 2016-03-22 NOTE — Patient Instructions (Signed)
GO TO THE LAB : Get the blood work     GO TO THE FRONT DESK Schedule your next appointment for a  routine checkup in 8 months  Schedule a Medicare wellness at your convenience    Take tramadol as prescribed, only as needed if pain.

## 2016-03-22 NOTE — Assessment & Plan Note (Signed)
Prediabetes: Diet and exercise discussed, check A1c. HTN: cont Lopressor Hyperlipidemia-- Not taking statins, will be very reluctant to do b/c potential for liver side effects. Last FLP satisfactory.   CAD: On aspirin and beta blockers MM: Follow-up by hematology DJD: Doing well, does not like to take Aleve Tylenol (due to liver potential side effects), likes tramadol prn>> Rx x provided.  Also takes flexeril rarely, the 5mg  tab is expensive, change to 10 mg 1/2 to 1 tab qhs prn, likes a 90 days rx Dizziness: Chronic, rec good hydration, stand up slowly.  RTC 8 months , Medicare wellness recommended

## 2016-03-22 NOTE — Assessment & Plan Note (Addendum)
Td 2013; pneumonia shot 2008 and~ 2011 per Pt ; prevnar-- 2016; no zostavax d/t MM   No further prostate or colon cancer screening. See previous entries  diet  and exercise discusse

## 2016-03-22 NOTE — Progress Notes (Signed)
Pre visit review using our clinic review tool, if applicable. No additional management support is needed unless otherwise documented below in the visit note. 

## 2016-03-22 NOTE — Progress Notes (Signed)
Subjective:    Patient ID: Gene Hill, male    DOB: 03/11/1940, 76 y.o.   MRN: [id number]  DOS:  03/22/2016 Type of visit - description : cpx Interval history: In general feeling well. Admits that diet has not been healthy lately. Unable to exercise Had successfully a right TKR. Does not like to take Aleve or Tylenol, afraid that Tylenol may affect his liver. Would like to have tramadol as needed for occasional pain. History of dizziness, on and off, for years. Symptoms are stable.   Review of Systems  Other than above, a 14 point review of systems is negative    Past Medical History:  Diagnosis Date  . Arthritis   . 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 Blood dyscrasia    low WBC d/t multiple myeloma  . Blood transfusion   . Carotid bruit    3-11 carotid u/s was  (-)  . 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 GERD (gastroesophageal reflux disease)   . Headache   . Heart murmur   . HYPERLIPIDEMIA 05/03/2006  . HYPERTENSION 05/03/2006  . 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 PEPTIC ULCER DISEASE 07/22/2008   per EGD 6-10 .Marland Kitchen ulcer duodenitis     Past Surgical History:  Procedure Laterality Date  . CERVICAL FUSION    . [place]   right  . ROTATOR CUFF REPAIR     right  . SPINAL FUSION    . TOTAL KNEE ARTHROPLASTY Right 12/23/2015   Procedure: TOTAL KNEE ARTHROPLASTY;  Surgeon: Earlie Server, MD;  Location: [facility];  Service: Orthopedics;  Laterality: Right;    Social History   Social History  . Marital status: Married    Spouse name: N/A  . Number of children: 4  . Years of education: N/A   Occupational History  . retired    Social History Main Topics  . Smoking status: Former Smoker    Packs/day: 0.25    Years: 20.00    Types: Cigarettes    Start date: 04/28/1954    Quit date: 11/28/1974  . Smokeless tobacco: Never Used   Comment: quit 35 years ago, 1976  . Alcohol use 0.0 oz/week     Comment: Rarely   . Drug use: No  . Sexual activity: Not on file   Other Topics Concern  . Not on file   Social History Narrative   Household-- pt, wife, son     Family History  Problem Relation Age of Onset  . Kidney cancer Father   . Leukemia Mother   . Pulmonary embolism Brother   . Stroke Maternal Grandmother   . Heart attack Maternal Grandfather   . Stroke Paternal Grandmother   . Cerebral palsy Son   . Prostate cancer Neg Hx   . Colon cancer Neg Hx   . Esophageal cancer Neg Hx   . Rectal cancer Neg Hx   . Stomach cancer Neg Hx      Allergies as of 03/22/2016   No Known Allergies     Medication List       Accurate as of 03/22/16  8:07 AM. Always use your most recent med list.          aspirin 325 MG tablet Take 325 mg by mouth daily.   cyclobenzaprine 5 MG tablet Commonly known as:  FLEXERIL Take  5 mg by mouth 3 (three) times daily as needed for muscle spasms.   Fish Oil 1200 MG Caps Take 1,200 mg by mouth daily.   ixazomib citrate 3 MG capsule Commonly known as:  NINLARO Take 1 capsule every 2 weeks on an empty stomach.   lidocaine 5 % ointment Commonly known as:  XYLOCAINE Apply 1 application topically 2 (two) times daily as needed. Mix w/ OTC Hydrocortisone 1 %   metoprolol 100 MG tablet Commonly known as:  LOPRESSOR Take 0.5 tablets (50 mg total) by mouth 2 (two) times daily.   multivitamin tablet Take 1 tablet by mouth daily.   pantoprazole 40 MG tablet Commonly known as:  PROTONIX Take 1 tablet (40 mg total) by mouth daily.   POMALYST 3 MG capsule Generic drug:  pomalidomide Take 1 capsule by mouth daily. Take with water on days 1-21. Repeat every 28 days. [id number]   PROBIOTIC PO Take 1 tablet by mouth daily.   traMADol 50 MG tablet Commonly known as:  ULTRAM Take 25-50 mg by mouth 2 (two) times daily as needed.   Vitamin D3 2000 units capsule Take 2,000 Units  by mouth daily.          Objective:   Physical Exam BP 122/76 (BP Location: Left Arm, Patient Position: Sitting, Cuff Size: Normal)   Pulse 76   Temp 97.6 F (36.4 C) (Oral)   Resp 14   Ht 5' 7" (1.702 m)   Wt 218 lb 6 oz (99.1 kg)   SpO2 98%   BMI 34.20 kg/m   General:   Well developed, well nourished . NAD.  Neck: No  thyromegaly  HEENT:  Normocephalic . Face symmetric, atraumatic Lungs:  CTA B Normal respiratory effort, no intercostal retractions, no accessory muscle use. Heart: RRR,  no murmur.  No pretibial edema bilaterally  Abdomen:  Not distended, soft, non-tender. No rebound or rigidity.   Skin: Exposed areas without rash. Not pale. Not jaundice Neurologic:  alert & oriented X3.  Speech normal, gait appropriate for age and unassisted Strength symmetric and appropriate for age.  Psych: Cognition and judgment appear intact.  Cooperative with normal attention span and concentration.  Behavior appropriate. No anxious or depressed appearing.    Assessment & Plan:   Assessment Pre-diabetes HTN Hyperlipidemia (self dc  simva 11-2014)  CAD by cath 1998, medical management, Cardiolite ~ 2007 ? Multiple myeloma -- stem cell transplant 2011, with recurrence as off 01-2015  GI: --Barrett' esophagus EGD 2010, 2013 --PUD 2010  MSK -- generalized aches, pains, chronic (no better after dc simvastatin ~ 11-2014) -- DJD Dr Percell Miller Hypogonadism Carotid bruit, [place] (-) 2011  PLAN  Prediabetes: Diet and exercise discussed, check A1c. HTN: cont Lopressor Hyperlipidemia-- Not taking statins, will be very reluctant to do b/c potential for liver side effects. Last FLP satisfactory.   CAD: On aspirin and beta blockers MM: Follow-up by hematology DJD: Doing well, does not like to take Aleve Tylenol (due to liver potential side effects), likes tramadol prn>> Rx x provided.  Also takes flexeril rarely, the 17m tab is expensive, change to 10 mg 1/2 to 1 tab qhs prn, likes a 90  days rx Dizziness: Chronic, rec good hydration, stand up slowly.  RTC 8 months , Medicare wellness recommended

## 2016-03-27 ENCOUNTER — Telehealth: Payer: Self-pay | Admitting: Internal Medicine

## 2016-03-27 MED ORDER — TRAMADOL HCL 50 MG PO TABS: 25.0000 mg | ORAL_TABLET | Freq: Two times a day (BID) | ORAL | 0 refills | Status: DC | PRN

## 2016-03-27 NOTE — Telephone Encounter (Signed)
Rx printed, awaiting MD signature.  

## 2016-03-27 NOTE — Telephone Encounter (Signed)
Please advise 

## 2016-03-27 NOTE — Telephone Encounter (Signed)
Ok to Rx 120, no RF

## 2016-03-27 NOTE — Telephone Encounter (Signed)
Please inform Pt's wife, Bartolo Darter that Tramadol Rx is ready for pick up at front desk for #120 tabs. She must bring old prescription w/ #40 to pick up in exchange. Thank you.

## 2016-03-27 NOTE — Telephone Encounter (Signed)
Caller name:Florence Relationship to patient: wife Can be reached: [phone] Pharmacy:  Reason for call: Request a new Rx for Tramadol for 120 tablets instead of the 40 that is on the Rx she has. States it is cheaper to get 120. Wife has Rx and will bring it back in when she picks up the new one.

## 2016-03-28 NOTE — Telephone Encounter (Signed)
Wife aware and will pick up on Friday

## 2016-03-30 ENCOUNTER — Other Ambulatory Visit (HOSPITAL_BASED_OUTPATIENT_CLINIC_OR_DEPARTMENT_OTHER): Payer: PPO

## 2016-03-30 ENCOUNTER — Other Ambulatory Visit: Payer: Self-pay | Admitting: *Deleted

## 2016-03-30 ENCOUNTER — Telehealth: Payer: Self-pay | Admitting: Internal Medicine

## 2016-03-30 ENCOUNTER — Ambulatory Visit: Payer: PPO

## 2016-03-30 ENCOUNTER — Ambulatory Visit (HOSPITAL_BASED_OUTPATIENT_CLINIC_OR_DEPARTMENT_OTHER): Payer: PPO | Admitting: Hematology & Oncology

## 2016-03-30 VITALS — BP 146/73 | HR 68 | Temp 98.0°F | Wt 221.0 lb

## 2016-03-30 DIAGNOSIS — C9000 Multiple myeloma not having achieved remission: Secondary | ICD-10-CM

## 2016-03-30 DIAGNOSIS — C9001 Multiple myeloma in remission: Secondary | ICD-10-CM | POA: Diagnosis not present

## 2016-03-30 LAB — CBC WITH DIFFERENTIAL (CANCER CENTER ONLY)
BASO#: 0 10*3/uL (ref 0.0–0.2)
BASO%: 1.3 % (ref 0.0–2.0)
EOS ABS: 0.1 10*3/uL (ref 0.0–0.5)
EOS%: 3.3 % (ref 0.0–7.0)
HCT: 39.1 % (ref 38.7–49.9)
HGB: 12.8 g/dL — ABNORMAL LOW (ref 13.0–17.1)
LYMPH#: 1.1 10*3/uL (ref 0.9–3.3)
LYMPH%: 35.6 % (ref 14.0–48.0)
MCH: 33.7 pg — AB (ref 28.0–33.4)
MCHC: 32.7 g/dL (ref 32.0–35.9)
MCV: 103 fL — ABNORMAL HIGH (ref 82–98)
MONO#: 0.4 10*3/uL (ref 0.1–0.9)
MONO%: 13.9 % — AB (ref 0.0–13.0)
NEUT#: 1.4 10*3/uL — ABNORMAL LOW (ref 1.5–6.5)
NEUT%: 45.9 % (ref 40.0–80.0)
PLATELETS: 179 10*3/uL (ref 145–400)
RBC: 3.8 10*6/uL — ABNORMAL LOW (ref 4.20–5.70)
RDW: 16.6 % — AB (ref 11.1–15.7)
WBC: 3 10*3/uL — ABNORMAL LOW (ref 4.0–10.0)

## 2016-03-30 LAB — COMPREHENSIVE METABOLIC PANEL
ALBUMIN: 3.8 g/dL (ref 3.5–5.0)
ALT: 22 U/L (ref 0–55)
AST: 18 U/L (ref 5–34)
Alkaline Phosphatase: 54 U/L (ref 40–150)
Anion Gap: 11 mEq/L (ref 3–11)
BILIRUBIN TOTAL: 0.44 mg/dL (ref 0.20–1.20)
BUN: 14 mg/dL (ref 7.0–26.0)
CHLORIDE: 102 meq/L (ref 98–109)
CO2: 27 meq/L (ref 22–29)
Calcium: 10.1 mg/dL (ref 8.4–10.4)
Creatinine: 1 mg/dL (ref 0.7–1.3)
EGFR: 76 mL/min/{1.73_m2} — AB (ref >90)
Glucose: 119 mg/dl (ref 70–140)
POTASSIUM: 4.2 meq/L (ref 3.5–5.1)
SODIUM: 139 meq/L (ref 136–145)
Total Protein: 7.9 g/dL (ref 6.4–8.3)

## 2016-03-30 MED ORDER — POMALYST 3 MG PO CAPS: ORAL_CAPSULE | ORAL | 0 refills | Status: DC

## 2016-03-30 NOTE — Telephone Encounter (Signed)
Pt's wife brought in older Tramadol rx for 40 tabs to be shredded and was given the new rx that was at front desk. (older rx put in the shred bin)

## 2016-03-30 NOTE — Progress Notes (Signed)
Hematology and Oncology Follow Up Visit  Gene Hill [id number] [birth year] 76 y.o. 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  Roots is back for followup. He really looks quite good. The right knee has recovered very nicely from his surgery. He completed his physical therapy couple weeks ago.  His myeloma studies have been stable. His last M spike back in January r was 0.7 g/dL. His IgA level was 1381 milligrams per deciliter. His Kappa Light chain was 5.6 mg/dL.  His appetite has been good. He's had no nausea or vomiting. He's had no cough. He's had no shortness of breath. He's had no rashes. He's had no leg swelling.  He continues on aspirin as a thrombotic prophylactic.  I told him that he can definitely go out and do what he would like. He go to church. I told him that he can go to restaurants. I told him that he can go out with his wife to friends and family. I told him that his immune system is adequate enough that he does not have a higher risk of infection.  Overall, I would say that his performance status is ECOG 1.  Medications:  Current Outpatient Prescriptions:  .  aspirin 325 MG tablet, Take 325 mg by mouth daily., Disp: , Rfl:  .  Cholecalciferol (VITAMIN D3) 2000 units capsule, Take 2,000 Units by mouth daily. , Disp: , Rfl:  .  cyclobenzaprine (FLEXERIL) 10 MG tablet, Take 1 tablet (10 mg total) by mouth at bedtime as needed for muscle spasms., Disp: 90 tablet, Rfl: 0 .  ixazomib citrate (NINLARO) 3 MG capsule, Take 1 capsule every 2 weeks on an empty stomach., Disp: 4 capsule, Rfl: 6 .  lidocaine (XYLOCAINE) 5 % ointment, Apply 1 application topically 2 (two) times daily as needed. Mix w/ OTC Hydrocortisone 1 %, Disp: 35.44 g, Rfl:  1 .  metoprolol (LOPRESSOR) 100 MG tablet, Take 0.5 tablets (50 mg total) by mouth 2 (two) times daily., Disp: 90 tablet, Rfl: 1 .  Multiple Vitamin (MULTIVITAMIN) tablet, Take 1 tablet by mouth daily., Disp: , Rfl:  .  Omega-3 Fatty Acids (FISH OIL) 1200 MG CAPS, Take 1,200 mg by mouth daily., Disp: , Rfl:  .  pantoprazole (PROTONIX) 40 MG tablet, Take 1 tablet (40 mg total) by mouth daily., Disp: 90 tablet, Rfl: 3 .  POMALYST 3 MG capsule, Take 1 capsule by mouth daily. Take with water on days 1-21. Repeat every 28 days. [id number], Disp: 21 capsule, Rfl: 0 .  Probiotic Product (PROBIOTIC PO), Take 1 tablet by mouth daily. , Disp: , Rfl:  .  traMADol (ULTRAM) 50 MG tablet, Take 0.5-1 tablets (25-50 mg total) by mouth 2 (two) times daily as needed., Disp: 120 tablet, Rfl: 0  Allergies: No Known Allergies  Past Medical History, Surgical history, Social history, and Family History were reviewed and updated.  Review of Systems: As above  Physical Exam:  weight is 221 lb (100.2 kg). His temperature is 98 F (36.7 C). His blood pressure is 146/73 (abnormal) and his pulse is 68.  Well-developed and well-nourished white gentleman. Head and neck exam shows no ocular or  oral lesions. He has no palpable lymph nodes. Lungs are clear. Cardiac exam regular rate and rhythm with no murmurs rubs or bruits. Abdomen is soft. He's mildly obese. He has good bowel sounds. There is no fluid wave. There is no palpable liver or spleen tip. Back exam shows no tenderness over the spine ribs or hips. Extremities shows the healing surgical scars on the right knee. He has some slight swelling of the right leg. He has negative Homans sign for either leg. No venous cords noted in either leg. Skin exam no rashes. Neurological exam is nonfocal.  Lab Results  Component Value Date   WBC 3.0 (L) 03/30/2016   HGB 12.8 (L) 03/30/2016   HCT 39.1 03/30/2016   MCV 103 (H) 03/30/2016   PLT 179 03/30/2016     Chemistry       Component Value Date/Time   NA 141 02/16/2016 1423   NA 139 01/11/2016 1417   K 3.6 02/16/2016 1423   K 3.9 01/11/2016 1417   CL 103 02/16/2016 1423   CO2 27 02/16/2016 1423   CO2 27 01/11/2016 1417   BUN 10 02/16/2016 1423   BUN 11.1 01/11/2016 1417   CREATININE 0.9 02/16/2016 1423   CREATININE 0.9 01/11/2016 1417      Component Value Date/Time   CALCIUM 9.7 02/16/2016 1423   CALCIUM 9.5 01/11/2016 1417   ALKPHOS 48 02/16/2016 1423   ALKPHOS 68 01/11/2016 1417   AST 21 02/16/2016 1423   AST 14 01/11/2016 1417   ALT 21 02/16/2016 1423   ALT 21 01/11/2016 1417   BILITOT 0.70 02/16/2016 1423   BILITOT 0.38 01/11/2016 1417       Impression and Plan: Mr. Brotherton is a 76 year old gentleman with history of IgA kappa myeloma. 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 He was treated with standard therapy and got into remission. 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  His relapsed myeloma is certainly high-risk given the chromosomal abnormalities..  From our point of view, everything looks fantastic.  I'm going to switch him over to [place] now. I just think we can use Xgeva and not have to put an IV in the him when he needs his bone [place]. This would be a lot more convenient for him. He is very happy about this.   I know that he will have a good Easter holiday. Hopefully, he will be with family and enjoy it.   I will plan to see him back in another 6 weeks.   [name], MD 3/9/201810:34 AM

## 2016-03-31 LAB — IGG, IGA, IGM
IGG (IMMUNOGLOBIN G), SERUM: 494 mg/dL — AB (ref 700–1600)
IGM (IMMUNOGLOBIN M), SRM: 13 mg/dL — AB (ref 15–143)

## 2016-04-02 LAB — KAPPA/LAMBDA LIGHT CHAINS
IG KAPPA FREE LIGHT CHAIN: 50.7 mg/L — AB (ref 3.3–19.4)
IG LAMBDA FREE LIGHT CHAIN: 13.6 mg/L (ref 5.7–26.3)
Kappa/Lambda FluidC Ratio: 3.73 — ABNORMAL HIGH (ref 0.26–1.65)

## 2016-04-04 LAB — PROTEIN ELECTROPHORESIS, SERUM, WITH REFLEX
A/G Ratio: 1 (ref 0.7–1.7)
ALPHA 1: 0.2 g/dL (ref 0.0–0.4)
ALPHA 2: 0.8 g/dL (ref 0.4–1.0)
Albumin: 3.6 g/dL (ref 2.9–4.4)
BETA: 1.1 g/dL (ref 0.7–1.3)
GAMMA GLOBULIN: 1.5 g/dL (ref 0.4–1.8)
GLOBULIN, TOTAL: 3.6 g/dL (ref 2.2–3.9)
INTERPRETATION(SEE BELOW): 0
M-SPIKE, %: 0.6 g/dL — AB
Total Protein: 7.2 g/dL (ref 6.0–8.5)

## 2016-04-06 ENCOUNTER — Telehealth: Payer: Self-pay | Admitting: *Deleted

## 2016-04-06 NOTE — Telephone Encounter (Addendum)
Patient aware of results.   ----- Message from [name], MD sent at 04/05/2016  8:10 PM EDT ----- Call - the myeloma level is stable!!!  It is 0.6!!! Great job!!!  pete

## 2016-04-30 ENCOUNTER — Other Ambulatory Visit: Payer: Self-pay | Admitting: *Deleted

## 2016-04-30 MED ORDER — POMALYST 3 MG PO CAPS: ORAL_CAPSULE | ORAL | 0 refills | Status: DC

## 2016-05-04 ENCOUNTER — Other Ambulatory Visit: Payer: Self-pay | Admitting: *Deleted

## 2016-05-10 ENCOUNTER — Ambulatory Visit (HOSPITAL_BASED_OUTPATIENT_CLINIC_OR_DEPARTMENT_OTHER): Payer: PPO | Admitting: Hematology & Oncology

## 2016-05-10 ENCOUNTER — Ambulatory Visit (HOSPITAL_BASED_OUTPATIENT_CLINIC_OR_DEPARTMENT_OTHER): Payer: PPO

## 2016-05-10 ENCOUNTER — Other Ambulatory Visit (HOSPITAL_BASED_OUTPATIENT_CLINIC_OR_DEPARTMENT_OTHER): Payer: PPO

## 2016-05-10 VITALS — BP 131/68 | HR 60 | Temp 98.3°F | Resp 17 | Wt 221.1 lb

## 2016-05-10 DIAGNOSIS — E349 Endocrine disorder, unspecified: Secondary | ICD-10-CM

## 2016-05-10 DIAGNOSIS — C9001 Multiple myeloma in remission: Secondary | ICD-10-CM

## 2016-05-10 DIAGNOSIS — C9000 Multiple myeloma not having achieved remission: Secondary | ICD-10-CM

## 2016-05-10 LAB — CMP (CANCER CENTER ONLY)
ALBUMIN: 3.5 g/dL (ref 3.3–5.5)
ALK PHOS: 42 U/L (ref 26–84)
ALT: 28 U/L (ref 10–47)
AST: 23 U/L (ref 11–38)
BILIRUBIN TOTAL: 0.8 mg/dL (ref 0.20–1.60)
BUN, Bld: 12 mg/dL (ref 7–22)
CALCIUM: 9.5 mg/dL (ref 8.0–10.3)
CO2: 27 mEq/L (ref 18–33)
CREATININE: 0.8 mg/dL (ref 0.6–1.2)
Chloride: 103 mEq/L (ref 98–108)
Glucose, Bld: 120 mg/dL — ABNORMAL HIGH (ref 73–118)
Potassium: 3.5 mEq/L (ref 3.3–4.7)
SODIUM: 138 meq/L (ref 128–145)
Total Protein: 7.3 g/dL (ref 6.4–8.1)

## 2016-05-10 LAB — CBC WITH DIFFERENTIAL (CANCER CENTER ONLY)
BASO#: 0 10*3/uL (ref 0.0–0.2)
BASO%: 1.5 % (ref 0.0–2.0)
EOS%: 6.4 % (ref 0.0–7.0)
Eosinophils Absolute: 0.2 10*3/uL (ref 0.0–0.5)
HEMATOCRIT: 36.3 % — AB (ref 38.7–49.9)
HEMOGLOBIN: 11.9 g/dL — AB (ref 13.0–17.1)
LYMPH#: 1 10*3/uL (ref 0.9–3.3)
LYMPH%: 36.6 % (ref 14.0–48.0)
MCH: 34.1 pg — ABNORMAL HIGH (ref 28.0–33.4)
MCHC: 32.8 g/dL (ref 32.0–35.9)
MCV: 104 fL — ABNORMAL HIGH (ref 82–98)
MONO#: 0.7 10*3/uL (ref 0.1–0.9)
MONO%: 27.9 % — ABNORMAL HIGH (ref 0.0–13.0)
NEUT%: 27.6 % — ABNORMAL LOW (ref 40.0–80.0)
NEUTROS ABS: 0.7 10*3/uL — AB (ref 1.5–6.5)
Platelets: 102 10*3/uL — ABNORMAL LOW (ref 145–400)
RBC: 3.49 10*6/uL — AB (ref 4.20–5.70)
RDW: 16.1 % — ABNORMAL HIGH (ref 11.1–15.7)
WBC: 2.7 10*3/uL — AB (ref 4.0–10.0)

## 2016-05-10 MED ORDER — DENOSUMAB 120 MG/1.7ML ~~LOC~~ SOLN
120.0000 mg | Freq: Once | SUBCUTANEOUS | Status: AC
  Administered 2016-05-10: 120 mg via SUBCUTANEOUS
  Filled 2016-05-10: qty 1.7

## 2016-05-10 NOTE — Patient Instructions (Signed)

## 2016-05-10 NOTE — Progress Notes (Signed)
Hematology and Oncology Follow Up Visit  Gene Hill [id number] 11-22-1940 76 y.o. 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 He really looks quite good. The right knee has recovered very nicely from his surgery. He completed his physical therapy about 2 months ago.  His myeloma studies have been stable. His last M spike back in January [name] was 0.6 g/dL. His IgA level was 1400 milligrams per deciliter. His Kappa Light chain was 5.1 mg/dL.  His main complaint has been dizziness. This might be from his medications. Not sure what else might be causing the dizziness. His blood pressure has been okay. He is on a beta blocker which might slow down his heart rate a little bit.  He has had no problems with bleeding. He's had no fever. He has had no leg swelling.  He is now able to go out and do things. He is worried about his immune system. I told that it is okay for him to go out and be around people and go to restaurants, church, etc.   Overall, I would say that his performance status is ECOG 1.  Medications:  Current Outpatient Prescriptions:  .  aspirin 325 MG tablet, Take 325 mg by mouth daily., Disp: , Rfl:  .  Cholecalciferol (VITAMIN D3) 2000 units capsule, Take 2,000 Units by mouth daily. , Disp: , Rfl:  .  cyclobenzaprine (FLEXERIL) 10 MG tablet, Take 1 tablet (10 mg total) by mouth at bedtime as needed for muscle spasms., Disp: 90 tablet, Rfl: 0 .  ixazomib citrate (NINLARO) 3 MG capsule, Take 1 capsule every 2 weeks on an empty stomach., Disp: 4 capsule, Rfl: 6 .  lidocaine (XYLOCAINE) 5 % ointment, Apply 1 application topically 2 (two) times daily as needed. Mix w/ OTC  Hydrocortisone 1 %, Disp: 35.44 g, Rfl: 1 .  metoprolol (LOPRESSOR) 100 MG tablet, Take 0.5 tablets (50 mg total) by mouth 2 (two) times daily., Disp: 90 tablet, Rfl: 1 .  Multiple Vitamin (MULTIVITAMIN) tablet, Take 1 tablet by mouth daily., Disp: , Rfl:  .  Omega-3 Fatty Acids (FISH OIL) 1200 MG CAPS, Take 1,200 mg by mouth daily., Disp: , Rfl:  .  pantoprazole (PROTONIX) 40 MG tablet, Take 1 tablet (40 mg total) by mouth daily., Disp: 90 tablet, Rfl: 3 .  POMALYST 3 MG capsule, Take 1 capsule by mouth daily. Take with water on days 1-21. Repeat every 28 days. [id number], Disp: 21 capsule, Rfl: 0 .  Probiotic Product (PROBIOTIC PO), Take 1 tablet by mouth daily. , Disp: , Rfl:  .  traMADol (ULTRAM) 50 MG tablet, Take 0.5-1 tablets (25-50 mg total) by mouth 2 (two) times daily as needed., Disp: 120 tablet, Rfl: 0  Allergies: No Known Allergies  Past Medical History, Surgical history, Social history, and Family History were reviewed and updated.  Review of Systems: As above  Physical Exam:  weight is 221 lb 1.9 oz (100.3 kg). His oral temperature is 98.3 F (36.8 C). His blood pressure is  131/68 and his pulse is 60. His respiration is 17 and oxygen saturation is 97%.  Well-developed and well-nourished white gentleman. Head and neck exam shows no ocular or oral lesions. He has no palpable lymph nodes. Lungs are clear. Cardiac exam regular rate and rhythm with no murmurs rubs or bruits. Abdomen is soft. He's mildly obese. He has good bowel sounds. There is no fluid wave. There is no palpable liver or spleen tip. Back exam shows no tenderness over the spine ribs or hips. Extremities shows the healing surgical scars on the right knee. He has some slight swelling of the right leg. He has negative Homans sign for either leg. No venous cords noted in either leg. Skin exam no rashes. Neurological exam is nonfocal.  Lab Results  Component Value Date   WBC 2.7 (L) 05/10/2016   HGB 11.9 (L) 05/10/2016    HCT 36.3 (L) 05/10/2016   MCV 104 (H) 05/10/2016   PLT 102 (L) 05/10/2016     Chemistry      Component Value Date/Time   NA 138 05/10/2016 1042   NA 139 03/30/2016 0937   K 3.5 05/10/2016 1042   K 4.2 03/30/2016 0937   CL 103 05/10/2016 1042   CO2 27 05/10/2016 1042   CO2 27 03/30/2016 0937   BUN 12 05/10/2016 1042   BUN 14.0 03/30/2016 0937   CREATININE 0.8 05/10/2016 1042   CREATININE 1.0 03/30/2016 0937      Component Value Date/Time   CALCIUM 9.5 05/10/2016 1042   CALCIUM 10.1 03/30/2016 0937   ALKPHOS 42 05/10/2016 1042   ALKPHOS 54 03/30/2016 0937   AST 23 05/10/2016 1042   AST 18 03/30/2016 0937   ALT 28 05/10/2016 1042   ALT 22 03/30/2016 0937   BILITOT 0.80 05/10/2016 1042   BILITOT 0.44 03/30/2016 0937       Impression and Plan: Gene Hill is a 76 year old gentleman with history of IgA kappa myeloma. 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 He was treated with standard therapy and got into remission. 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  His relapsed myeloma is certainly high-risk given the chromosomal abnormalities..  For right now, I just want to hold his Ninlaro and Pomalidomide. 1 to see if this helps his dizziness.  I'm sure that his myeloma will relapse clinically. We can just watch him closely.. I think if he does relapse, we might consider either Kyprolis or Daratumumab. Of course, for either of these he would need a Port-A-Cath.  We will give him Xgeva today. He gets this every 3 months.  I will like to see him back in one month. We will see how his dizziness response to stopping the Ninlaro/Pomalidomide.   [name], MD 4/19/201811:37 AM

## 2016-05-11 LAB — IGG, IGA, IGM
IGG (IMMUNOGLOBIN G), SERUM: 485 mg/dL — AB (ref 700–1600)
IGM (IMMUNOGLOBIN M), SRM: 13 mg/dL — AB (ref 15–143)
IgA, Qn, Serum: 1182 mg/dL — ABNORMAL HIGH (ref 61–437)

## 2016-05-11 LAB — KAPPA/LAMBDA LIGHT CHAINS
IG KAPPA FREE LIGHT CHAIN: 51.3 mg/L — AB (ref 3.3–19.4)
IG LAMBDA FREE LIGHT CHAIN: 13 mg/L (ref 5.7–26.3)
Kappa/Lambda FluidC Ratio: 3.95 — ABNORMAL HIGH (ref 0.26–1.65)

## 2016-05-15 LAB — PROTEIN ELECTROPHORESIS, SERUM, WITH REFLEX
A/G Ratio: 1.1 (ref 0.7–1.7)
ALBUMIN: 3.7 g/dL (ref 2.9–4.4)
ALPHA 1: 0.2 g/dL (ref 0.0–0.4)
ALPHA 2: 0.7 g/dL (ref 0.4–1.0)
Beta: 1 g/dL (ref 0.7–1.3)
Gamma Globulin: 1.4 g/dL (ref 0.4–1.8)
Globulin, Total: 3.3 g/dL (ref 2.2–3.9)
INTERPRETATION(SEE BELOW): 0
M-Spike, %: 0.5 g/dL — ABNORMAL HIGH
Total Protein: 7 g/dL (ref 6.0–8.5)

## 2016-05-16 ENCOUNTER — Telehealth: Payer: Self-pay | Admitting: *Deleted

## 2016-05-16 NOTE — Telephone Encounter (Addendum)
Patients wife aware of results  ----- Message from [name], MD sent at 05/16/2016  7:06 AM EDT ----- Call - the myeloma is still nice and low!!!  Gene Hill

## 2016-06-12 DIAGNOSIS — M25561 Pain in right knee: Secondary | ICD-10-CM | POA: Diagnosis not present

## 2016-06-14 ENCOUNTER — Ambulatory Visit (HOSPITAL_BASED_OUTPATIENT_CLINIC_OR_DEPARTMENT_OTHER): Payer: PPO | Admitting: Hematology & Oncology

## 2016-06-14 ENCOUNTER — Other Ambulatory Visit (HOSPITAL_BASED_OUTPATIENT_CLINIC_OR_DEPARTMENT_OTHER): Payer: PPO

## 2016-06-14 VITALS — BP 128/77 | HR 68 | Temp 98.2°F | Resp 17 | Wt 216.0 lb

## 2016-06-14 DIAGNOSIS — C9000 Multiple myeloma not having achieved remission: Secondary | ICD-10-CM | POA: Diagnosis not present

## 2016-06-14 DIAGNOSIS — C9002 Multiple myeloma in relapse: Secondary | ICD-10-CM | POA: Diagnosis not present

## 2016-06-14 DIAGNOSIS — C9001 Multiple myeloma in remission: Secondary | ICD-10-CM

## 2016-06-14 LAB — COMPREHENSIVE METABOLIC PANEL
ALT: 21 U/L (ref 0–55)
AST: 25 U/L (ref 5–34)
Albumin: 3.9 g/dL (ref 3.5–5.0)
Alkaline Phosphatase: 54 U/L (ref 40–150)
Anion Gap: 9 mEq/L (ref 3–11)
BUN: 15 mg/dL (ref 7.0–26.0)
CHLORIDE: 106 meq/L (ref 98–109)
CO2: 24 meq/L (ref 22–29)
Calcium: 9.6 mg/dL (ref 8.4–10.4)
Creatinine: 0.9 mg/dL (ref 0.7–1.3)
EGFR: 81 mL/min/{1.73_m2} — ABNORMAL LOW (ref >90)
GLUCOSE: 127 mg/dL (ref 70–140)
Potassium: 3.8 mEq/L (ref 3.5–5.1)
Sodium: 139 mEq/L (ref 136–145)
Total Bilirubin: 0.37 mg/dL (ref 0.20–1.20)
Total Protein: 8 g/dL (ref 6.4–8.3)

## 2016-06-14 LAB — CBC WITH DIFFERENTIAL (CANCER CENTER ONLY)
BASO#: 0 10*3/uL (ref 0.0–0.2)
BASO%: 0.5 % (ref 0.0–2.0)
EOS%: 1.8 % (ref 0.0–7.0)
Eosinophils Absolute: 0.1 10*3/uL (ref 0.0–0.5)
HCT: 39.7 % (ref 38.7–49.9)
HEMOGLOBIN: 13.1 g/dL (ref 13.0–17.1)
LYMPH#: 1.2 10*3/uL (ref 0.9–3.3)
LYMPH%: 30.5 % (ref 14.0–48.0)
MCH: 34.5 pg — ABNORMAL HIGH (ref 28.0–33.4)
MCHC: 33 g/dL (ref 32.0–35.9)
MCV: 105 fL — ABNORMAL HIGH (ref 82–98)
MONO#: 0.6 10*3/uL (ref 0.1–0.9)
MONO%: 14.6 % — AB (ref 0.0–13.0)
NEUT%: 52.6 % (ref 40.0–80.0)
NEUTROS ABS: 2.1 10*3/uL (ref 1.5–6.5)
PLATELETS: 170 10*3/uL (ref 145–400)
RBC: 3.8 10*6/uL — ABNORMAL LOW (ref 4.20–5.70)
RDW: 15 % (ref 11.1–15.7)
WBC: 3.9 10*3/uL — AB (ref 4.0–10.0)

## 2016-06-14 LAB — LACTATE DEHYDROGENASE: LDH: 141 U/L (ref 125–245)

## 2016-06-14 NOTE — Progress Notes (Signed)
Hematology and Oncology Follow Up Visit  Gene Hill [id number] July 12, 1940 76 y.o. 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 is back for followup. He looks quite good. He feels good. He still has some of the dizziness. He has been off Ninlaro and Pomalidomide. As such, I don't think the Ninlaro or Pomalidomide has or is the etiology of his dizziness.  His last myeloma studies back in November looked great. His monoclonal spike was 0.5 g/dL. His IgA level is 1182 milligrams per deciliter. His Kappa light chain is 5.1 mg/dL.  His right knee is doing quite well. He had surgery for this about 6 months ago.  He's had no fever. He's had no rashes. He's had no cough or shortness of breath.   He had a lot of questions about "cancer". He was worried about eating sugar. He also was told that she can can cause cancer. I told him that he has to kind of cancer that is not affected by chickens or sugar.   There's been no problem with bowels or bladder.   His garden is doing pretty well. He says that that there is a skunk that has been trying to eat his potatoes.   Overall, I would say that his performance status is ECOG 1.  Medications:  Current Outpatient Prescriptions:  .  aspirin 325 MG tablet, Take 325 mg by mouth daily., Disp: , Rfl:  .  Cholecalciferol (VITAMIN D3) 2000 units capsule, Take 2,000 Units by mouth daily. , Disp: , Rfl:  .  cyclobenzaprine (FLEXERIL) 10 MG tablet, Take 1 tablet (10 mg total) by mouth at bedtime as needed for muscle spasms., Disp: 90 tablet, Rfl: 0 .  ixazomib citrate (NINLARO) 3 MG capsule, Take 1 capsule every 2 weeks on an empty stomach., Disp: 4 capsule, Rfl: 6 .  lidocaine  (XYLOCAINE) 5 % ointment, Apply 1 application topically 2 (two) times daily as needed. Mix w/ OTC Hydrocortisone 1 %, Disp: 35.44 g, Rfl: 1 .  metoprolol (LOPRESSOR) 100 MG tablet, Take 0.5 tablets (50 mg total) by mouth 2 (two) times daily., Disp: 90 tablet, Rfl: 1 .  Multiple Vitamin (MULTIVITAMIN) tablet, Take 1 tablet by mouth daily., Disp: , Rfl:  .  Omega-3 Fatty Acids (FISH OIL) 1200 MG CAPS, Take 1,200 mg by mouth daily., Disp: , Rfl:  .  pantoprazole (PROTONIX) 40 MG tablet, Take 1 tablet (40 mg total) by mouth daily., Disp: 90 tablet, Rfl: 3 .  POMALYST 3 MG capsule, Take 1 capsule by mouth daily. Take with water on days 1-21. Repeat every 28 days. [id number], Disp: 21 capsule, Rfl: 0 .  Probiotic Product (PROBIOTIC PO), Take 1 tablet by mouth daily. , Disp: , Rfl:  .  traMADol (ULTRAM) 50 MG tablet, Take 0.5-1 tablets (25-50 mg total) by mouth 2 (two) times daily as needed., Disp: 120 tablet, Rfl: 0  Allergies: No Known Allergies  Past Medical History, Surgical history, Social history, and Family History were reviewed and updated.  Review of Systems: As above  Physical Exam:  weight is 216 lb (98 kg). His oral temperature is 98.2 F (36.8 C). His blood pressure is 128/77 and his pulse is 68. His respiration is 17 and oxygen saturation is 96%.  Well-developed and well-nourished white gentleman. Head and neck exam shows no ocular or oral lesions. He has no palpable lymph nodes. Lungs are clear. Cardiac exam regular rate and rhythm with no murmurs rubs or bruits. Abdomen is soft. He's mildly obese. He has good bowel sounds. There is no fluid wave. There is no palpable liver or spleen tip. Back exam shows no tenderness over the spine ribs or hips. Extremities shows the healing surgical scars on the right knee. He has some slight swelling of the right leg. He has negative Homans sign for either leg. No venous cords noted in either leg. Skin exam no rashes. Neurological exam is  nonfocal.  Lab Results  Component Value Date   WBC 3.9 (L) 06/14/2016   HGB 13.1 06/14/2016   HCT 39.7 06/14/2016   MCV 105 (H) 06/14/2016   PLT 170 06/14/2016     Chemistry      Component Value Date/Time   NA 138 05/10/2016 1042   NA 139 03/30/2016 0937   K 3.5 05/10/2016 1042   K 4.2 03/30/2016 0937   CL 103 05/10/2016 1042   CO2 27 05/10/2016 1042   CO2 27 03/30/2016 0937   BUN 12 05/10/2016 1042   BUN 14.0 03/30/2016 0937   CREATININE 0.8 05/10/2016 1042   CREATININE 1.0 03/30/2016 0937      Component Value Date/Time   CALCIUM 9.5 05/10/2016 1042   CALCIUM 10.1 03/30/2016 0937   ALKPHOS 42 05/10/2016 1042   ALKPHOS 54 03/30/2016 0937   AST 23 05/10/2016 1042   AST 18 03/30/2016 0937   ALT 28 05/10/2016 1042   ALT 22 03/30/2016 0937   BILITOT 0.80 05/10/2016 1042   BILITOT 0.44 03/30/2016 0937       Impression and Plan: Gene Hill is a 76 year old gentleman with history of IgA kappa myeloma. 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 He was treated with standard therapy and got into remission. 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  His relapsed myeloma is certainly high-risk given the chromosomal abnormalities..  For right now, I just want to continue to hold his Ninlaro and Pomalidomide.   We will plan to have him come back in 6 weeks. We see him back, he will get his Xgeva.   I spent about 30 minutes with he and his wife. I was trying to help them with their concerns about sugar and chickens.  [name], MD 5/24/20182:14 PM

## 2016-06-15 LAB — IGG, IGA, IGM
IgA, Qn, Serum: 1475 mg/dL — ABNORMAL HIGH (ref 61–437)
IgG, Qn, Serum: 524 mg/dL — ABNORMAL LOW (ref 700–1600)
IgM, Qn, Serum: 10 mg/dL — ABNORMAL LOW (ref 15–143)

## 2016-06-15 LAB — KAPPA/LAMBDA LIGHT CHAINS
IG LAMBDA FREE LIGHT CHAIN: 8.6 mg/L (ref 5.7–26.3)
Ig Kappa Free Light Chain: 36 mg/L — ABNORMAL HIGH (ref 3.3–19.4)
KAPPA/LAMBDA FLC RATIO: 4.19 — AB (ref 0.26–1.65)

## 2016-06-19 LAB — PROTEIN ELECTROPHORESIS, SERUM, WITH REFLEX
A/G Ratio: 1 (ref 0.7–1.7)
ALPHA 2: 0.8 g/dL (ref 0.4–1.0)
Albumin: 3.7 g/dL (ref 2.9–4.4)
Alpha 1: 0.2 g/dL (ref 0.0–0.4)
BETA: 2.2 g/dL — AB (ref 0.7–1.3)
GLOBULIN, TOTAL: 3.6 g/dL (ref 2.2–3.9)
Gamma Globulin: 0.5 g/dL (ref 0.4–1.8)
Interpretation(See Below): 0
M-SPIKE, %: 0.8 g/dL — AB
Total Protein: 7.3 g/dL (ref 6.0–8.5)

## 2016-06-20 ENCOUNTER — Other Ambulatory Visit: Payer: Self-pay | Admitting: Internal Medicine

## 2016-06-21 ENCOUNTER — Telehealth: Payer: Self-pay

## 2016-06-21 MED ORDER — TRAMADOL HCL 50 MG PO TABS: 25.0000 mg | ORAL_TABLET | Freq: Two times a day (BID) | ORAL | 0 refills | Status: DC | PRN

## 2016-06-21 NOTE — Telephone Encounter (Signed)
Rx faxed to [facility] pharmacy.  

## 2016-06-21 NOTE — Telephone Encounter (Signed)
Ok 120, no RFs

## 2016-06-21 NOTE — Telephone Encounter (Signed)
Pt is requesting refill on Tramadol 50mg  tabs.   Last OV: 03/22/2016 Last Fill: 03/27/2016 #120 and 0RF UDS: 10/26/2015 Low risk  Please advise.

## 2016-06-21 NOTE — Telephone Encounter (Signed)
Rx printed, awaiting MD signature.  

## 2016-07-26 ENCOUNTER — Ambulatory Visit (HOSPITAL_BASED_OUTPATIENT_CLINIC_OR_DEPARTMENT_OTHER): Payer: PPO

## 2016-07-26 ENCOUNTER — Other Ambulatory Visit (HOSPITAL_BASED_OUTPATIENT_CLINIC_OR_DEPARTMENT_OTHER): Payer: PPO

## 2016-07-26 ENCOUNTER — Ambulatory Visit (HOSPITAL_BASED_OUTPATIENT_CLINIC_OR_DEPARTMENT_OTHER): Payer: PPO | Admitting: Hematology & Oncology

## 2016-07-26 VITALS — BP 135/83 | HR 71 | Temp 98.4°F | Resp 18 | Wt 217.0 lb

## 2016-07-26 DIAGNOSIS — C9002 Multiple myeloma in relapse: Secondary | ICD-10-CM | POA: Diagnosis not present

## 2016-07-26 DIAGNOSIS — C9000 Multiple myeloma not having achieved remission: Secondary | ICD-10-CM

## 2016-07-26 DIAGNOSIS — E349 Endocrine disorder, unspecified: Secondary | ICD-10-CM

## 2016-07-26 LAB — CBC WITH DIFFERENTIAL (CANCER CENTER ONLY)
BASO#: 0 10*3/uL (ref 0.0–0.2)
BASO%: 0.3 % (ref 0.0–2.0)
EOS ABS: 0.1 10*3/uL (ref 0.0–0.5)
EOS%: 1.6 % (ref 0.0–7.0)
HCT: 39.8 % (ref 38.7–49.9)
HEMOGLOBIN: 13.2 g/dL (ref 13.0–17.1)
LYMPH#: 1.4 10*3/uL (ref 0.9–3.3)
LYMPH%: 37.8 % (ref 14.0–48.0)
MCH: 34.6 pg — AB (ref 28.0–33.4)
MCHC: 33.2 g/dL (ref 32.0–35.9)
MCV: 104 fL — ABNORMAL HIGH (ref 82–98)
MONO#: 0.5 10*3/uL (ref 0.1–0.9)
MONO%: 13.8 % — AB (ref 0.0–13.0)
NEUT%: 46.5 % (ref 40.0–80.0)
NEUTROS ABS: 1.8 10*3/uL (ref 1.5–6.5)
Platelets: 160 10*3/uL (ref 145–400)
RBC: 3.82 10*6/uL — AB (ref 4.20–5.70)
RDW: 14.2 % (ref 11.1–15.7)
WBC: 3.8 10*3/uL — AB (ref 4.0–10.0)

## 2016-07-26 LAB — CMP (CANCER CENTER ONLY)
ALBUMIN: 3.5 g/dL (ref 3.3–5.5)
ALK PHOS: 38 U/L (ref 26–84)
ALT: 28 U/L (ref 10–47)
AST: 26 U/L (ref 11–38)
BILIRUBIN TOTAL: 0.6 mg/dL (ref 0.20–1.60)
BUN: 14 mg/dL (ref 7–22)
CHLORIDE: 103 meq/L (ref 98–108)
CO2: 27 mEq/L (ref 18–33)
Calcium: 9.4 mg/dL (ref 8.0–10.3)
Creat: 0.9 mg/dl (ref 0.6–1.2)
Glucose, Bld: 120 mg/dL — ABNORMAL HIGH (ref 73–118)
Potassium: 3.9 mEq/L (ref 3.3–4.7)
SODIUM: 140 meq/L (ref 128–145)
Total Protein: 7.8 g/dL (ref 6.4–8.1)

## 2016-07-26 MED ORDER — DENOSUMAB 120 MG/1.7ML ~~LOC~~ SOLN
120.0000 mg | Freq: Once | SUBCUTANEOUS | Status: AC
  Administered 2016-07-26: 120 mg via SUBCUTANEOUS
  Filled 2016-07-26: qty 1.7

## 2016-07-26 NOTE — Patient Instructions (Signed)

## 2016-07-26 NOTE — Progress Notes (Signed)
Hematology and Oncology Follow Up Visit  Gene Hill [id number] 1941/01/01 76 y.o. 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 is back for followup. He looks quite good. He feels good. He has been off Ninlaro and Pomalidomide. He has been off these now for probably 3 months.  His last myeloma studies back in May looked great. His monoclonal spike was 0.8 g/dL. His IgA level is 1475 milligrams per deciliter. His Kappa light chain is 3.6 mg/dL.  His right knee is doing quite well. He had surgery for this about 7 months ago.  He's had no fever. He's had no rashes. He's had no cough or shortness of breath.   He had a lot of questions about "cancer". He was worried about eating sugar. He also was told that sugar can cause cancer. I told him that he has to kind of cancer that is not affected by chickens or sugar.   There's been no problem with bowels or bladder.   His garden is doing pretty well. He is very kind and brought me in some garlic and green beans. .   Overall, I would say that his performance status is ECOG 1.  Medications:  Current Outpatient Prescriptions:  .  aspirin 325 MG tablet, Take 325 mg by mouth daily., Disp: , Rfl:  .  Cholecalciferol (VITAMIN D3) 2000 units capsule, Take 2,000 Units by mouth daily. , Disp: , Rfl:  .  cyclobenzaprine (FLEXERIL) 10 MG tablet, Take 1 tablet (10 mg total) by mouth at bedtime as needed for muscle spasms., Disp: 90 tablet, Rfl: 0 .  lidocaine (XYLOCAINE) 5 % ointment, Apply 1 application topically 2 (two) times daily as needed. Mix w/ OTC Hydrocortisone 1 %, Disp: 35.44 g, Rfl: 1 .  metoprolol tartrate (LOPRESSOR) 100 MG tablet, Take 0.5 tablets (50 mg total)  by mouth 2 (two) times daily., Disp: 90 tablet, Rfl: 2 .  Multiple Vitamin (MULTIVITAMIN) tablet, Take 1 tablet by mouth daily., Disp: , Rfl:  .  Omega-3 Fatty Acids (FISH OIL) 1200 MG CAPS, Take 1,200 mg by mouth daily., Disp: , Rfl:  .  pantoprazole (PROTONIX) 40 MG tablet, Take 1 tablet (40 mg total) by mouth daily., Disp: 90 tablet, Rfl: 2 .  Probiotic Product (PROBIOTIC PO), Take 1 tablet by mouth daily. , Disp: , Rfl:  .  traMADol (ULTRAM) 50 MG tablet, Take 0.5-1 tablets (25-50 mg total) by mouth 2 (two) times daily as needed., Disp: 120 tablet, Rfl: 0  Allergies: No Known Allergies  Past Medical History, Surgical history, Social history, and Family History were reviewed and updated.  Review of Systems: As above  Physical Exam:  weight is 217 lb (98.4 kg). His oral temperature is 98.4 F (36.9 C). His blood pressure is 135/83 and his pulse is 71. His respiration is 18 and oxygen saturation is 97%.  Well-developed and well-nourished white gentleman. Head and neck exam shows no ocular or oral lesions. He has no palpable lymph nodes. Lungs are clear. Cardiac exam regular rate and rhythm with no  murmurs rubs or bruits. Abdomen is soft. He's mildly obese. He has good bowel sounds. There is no fluid wave. There is no palpable liver or spleen tip. Back exam shows no tenderness over the spine ribs or hips. Extremities shows the healing surgical scars on the right knee. He has some slight swelling of the right leg. He has negative Homans sign for either leg. No venous cords noted in either leg. Skin exam no rashes. Neurological exam is nonfocal.  Lab Results  Component Value Date   WBC 3.8 (L) 07/26/2016   HGB 13.2 07/26/2016   HCT 39.8 07/26/2016   MCV 104 (H) 07/26/2016   PLT 160 07/26/2016     Chemistry      Component Value Date/Time   NA 140 07/26/2016 1021   NA 139 06/14/2016 1246   K 3.9 07/26/2016 1021   K 3.8 06/14/2016 1246   CL 103 07/26/2016 1021   CO2 27 07/26/2016 1021    CO2 24 06/14/2016 1246   BUN 14 07/26/2016 1021   BUN 15.0 06/14/2016 1246   CREATININE 0.9 07/26/2016 1021   CREATININE 0.9 06/14/2016 1246      Component Value Date/Time   CALCIUM 9.4 07/26/2016 1021   CALCIUM 9.6 06/14/2016 1246   ALKPHOS 38 07/26/2016 1021   ALKPHOS 54 06/14/2016 1246   AST 26 07/26/2016 1021   AST 25 06/14/2016 1246   ALT 28 07/26/2016 1021   ALT 21 06/14/2016 1246   BILITOT 0.60 07/26/2016 1021   BILITOT 0.37 06/14/2016 1246       Impression and Plan: Gene Hill is a 76 year old gentleman with history of IgA kappa myeloma. 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 He was treated with standard therapy and got into remission. 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  His relapsed myeloma is certainly high-risk given the chromosomal abnormalities..  For right now, I just want to continue to hold his Ninlaro and Pomalidomide.   We will give him the Xgeva today.  I'll like to get him back in another month or so. We will have to watch his myeloma numbers closely. If he does begin to show progression, we may need to consider intervention with treatment.   [name], MD 7/5/20181:18 PM

## 2016-07-27 LAB — KAPPA/LAMBDA LIGHT CHAINS
IG KAPPA FREE LIGHT CHAIN: 39.6 mg/L — AB (ref 3.3–19.4)
Ig Lambda Free Light Chain: 7.9 mg/L (ref 5.7–26.3)
Kappa/Lambda FluidC Ratio: 5.01 — ABNORMAL HIGH (ref 0.26–1.65)

## 2016-07-27 LAB — IGG, IGA, IGM
IGM (IMMUNOGLOBIN M), SRM: 10 mg/dL — AB (ref 15–143)
IgA, Qn, Serum: 1774 mg/dL — ABNORMAL HIGH (ref 61–437)
IgG, Qn, Serum: 482 mg/dL — ABNORMAL LOW (ref 700–1600)

## 2016-07-30 LAB — PROTEIN ELECTROPHORESIS, SERUM, WITH REFLEX
A/G Ratio: 0.9 (ref 0.7–1.7)
Albumin: 3.7 g/dL (ref 2.9–4.4)
Alpha 1: 0.2 g/dL (ref 0.0–0.4)
Alpha 2: 0.8 g/dL (ref 0.4–1.0)
Beta: 2.5 g/dL — ABNORMAL HIGH (ref 0.7–1.3)
Gamma Globulin: 0.5 g/dL (ref 0.4–1.8)
Globulin, Total: 4 g/dL — ABNORMAL HIGH (ref 2.2–3.9)
Interpretation(See Below): 0
M-Spike, %: 1.2 g/dL — ABNORMAL HIGH
Total Protein: 7.7 g/dL (ref 6.0–8.5)

## 2016-07-31 ENCOUNTER — Other Ambulatory Visit: Payer: Self-pay | Admitting: *Deleted

## 2016-07-31 ENCOUNTER — Ambulatory Visit (HOSPITAL_BASED_OUTPATIENT_CLINIC_OR_DEPARTMENT_OTHER): Payer: PPO | Admitting: Hematology & Oncology

## 2016-07-31 VITALS — BP 154/89 | HR 94 | Temp 98.3°F | Resp 18 | Wt 215.0 lb

## 2016-07-31 DIAGNOSIS — Z9484 Stem cells transplant status: Secondary | ICD-10-CM

## 2016-07-31 DIAGNOSIS — C9000 Multiple myeloma not having achieved remission: Secondary | ICD-10-CM

## 2016-07-31 DIAGNOSIS — C9002 Multiple myeloma in relapse: Secondary | ICD-10-CM

## 2016-07-31 MED ORDER — POMALIDOMIDE 3 MG PO CAPS: 3.0000 mg | ORAL_CAPSULE | Freq: Every day | ORAL | 0 refills | Status: DC

## 2016-07-31 MED ORDER — POMALIDOMIDE 3 MG PO CAPS: 3.0000 mg | ORAL_CAPSULE | Freq: Every day | ORAL | 4 refills | Status: DC

## 2016-07-31 MED ORDER — IXAZOMIB CITRATE 3 MG PO CAPS: ORAL_CAPSULE | ORAL | 4 refills | Status: DC

## 2016-07-31 NOTE — Progress Notes (Signed)
Hematology and Oncology Follow Up Visit  Gene Hill [id number] [birth year] 76 y.o. 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  Ackert is back for followup. Unfortunately, it looks like his disease is coming back. We got his myeloma studies recently. Unfortunately it showed that [name] was now up to 1.2 g/dL. His IgA level was 1774 milligrams per deciliter. His kappa light chain was 4 mg/dL.  I think we're going to have to get him back on to treatment. I talked to he and his wife about this. I think that we can restart him on Ninlaro/Pomalidomide. He had been on this. We had kept him off this for about 3-4 months. He has some dizziness. This is not really gotten any better. As such, I think we can retry the oral protocol.  He knows all about the side effects. I'm not going to give him full dose.  Given the fact that he has the 17p-abnormality, I think this is why he has this relapse.   Overall, I would say that his performance status is ECOG 1.  Medications:  Current Outpatient Prescriptions:  .  aspirin 325 MG tablet, Take 325 mg by mouth daily., Disp: , Rfl:  .  Cholecalciferol (VITAMIN D3) 2000 units capsule, Take 2,000 Units by mouth daily. , Disp: , Rfl:  .  cyclobenzaprine (FLEXERIL) 10 MG tablet, Take 1 tablet (10 mg total) by mouth at bedtime as needed for muscle spasms., Disp: 90 tablet, Rfl: 0 .  ixazomib citrate (NINLARO) 3 MG capsule, Take 1 capsule weekly for 3 weeks on and 1 week off., Disp: 3 capsule, Rfl: 4 .  lidocaine (XYLOCAINE) 5 % ointment, Apply 1 application topically 2 (two) times daily as needed. Mix w/ OTC Hydrocortisone 1 %, Disp: 35.44 g, Rfl: 1 .   metoprolol tartrate (LOPRESSOR) 100 MG tablet, Take 0.5 tablets (50 mg total) by mouth 2 (two) times daily., Disp: 90 tablet, Rfl: 2 .  Multiple Vitamin (MULTIVITAMIN) tablet, Take 1 tablet by mouth daily., Disp: , Rfl:  .  Omega-3 Fatty Acids (FISH OIL) 1200 MG CAPS, Take 1,200 mg by mouth daily., Disp: , Rfl:  .  pantoprazole (PROTONIX) 40 MG tablet, Take 1 tablet (40 mg total) by mouth daily., Disp: 90 tablet, Rfl: 2 .  pomalidomide (POMALYST) 3 MG capsule, Take 1 capsule (3 mg total) by mouth daily. Take with water on days 1-21. Repeat every 28 days. [id number], Disp: 21 capsule, Rfl: 0 .  Probiotic Product (PROBIOTIC PO), Take 1 tablet by mouth daily. , Disp: , Rfl:  .  traMADol (ULTRAM) 50 MG tablet, Take 0.5-1 tablets (25-50 mg total) by mouth 2 (two) times daily as needed., Disp: 120 tablet, Rfl: 0  Allergies: No Known Allergies  Past Medical History, Surgical history, Social history, and Family History were reviewed and updated.  Review of Systems: As above  Physical Exam:  weight is 215 lb (97.5 kg). His oral temperature is 98.3 F (36.8 C). His blood pressure is  154/89 (abnormal) and his pulse is 94. His respiration is 18 and oxygen saturation is 95%.  Well-developed and well-nourished white gentleman. Head and neck exam shows no ocular or oral lesions. He has no palpable lymph nodes. Lungs are clear. Cardiac exam regular rate and rhythm with no murmurs rubs or bruits. Abdomen is soft. He's mildly obese. He has good bowel sounds. There is no fluid wave. There is no palpable liver or spleen tip. Back exam shows no tenderness over the spine ribs or hips. Extremities shows the healing surgical scars on the right knee. He has some slight swelling of the right leg. He has negative Homans sign for either leg. No venous cords noted in either leg. Skin exam no rashes. Neurological exam is nonfocal.  Lab Results  Component Value Date   WBC 3.8 (L) 07/26/2016   HGB 13.2 07/26/2016   HCT  39.8 07/26/2016   MCV 104 (H) 07/26/2016   PLT 160 07/26/2016     Chemistry      Component Value Date/Time   NA 140 07/26/2016 1021   NA 139 06/14/2016 1246   K 3.9 07/26/2016 1021   K 3.8 06/14/2016 1246   CL 103 07/26/2016 1021   CO2 27 07/26/2016 1021   CO2 24 06/14/2016 1246   BUN 14 07/26/2016 1021   BUN 15.0 06/14/2016 1246   CREATININE 0.9 07/26/2016 1021   CREATININE 0.9 06/14/2016 1246      Component Value Date/Time   CALCIUM 9.4 07/26/2016 1021   CALCIUM 9.6 06/14/2016 1246   ALKPHOS 38 07/26/2016 1021   ALKPHOS 54 06/14/2016 1246   AST 26 07/26/2016 1021   AST 25 06/14/2016 1246   ALT 28 07/26/2016 1021   ALT 21 06/14/2016 1246   BILITOT 0.60 07/26/2016 1021   BILITOT 0.37 06/14/2016 1246       Impression and Plan: Mr. Tout is a 76 year old gentleman with history of IgA kappa myeloma. 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 He was treated with standard therapy and got into remission. 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  His relapsed myeloma is certainly high-risk given the chromosomal abnormalities..  Hopefully, we can get him started in the next week. We will plan to see him back in 4 weeks. I think that we will know in 2 or 3 months how well things are working.  I think if the Ninlaro/Pomalidomide protocol does not result in a significant reduction in [name], then we probably will have to switch over to Kyprolis/Cytoxan. I think this would be also a good regimen given that he has the 17p-chromosomal abnormality.  I spent about 30 minutes with he and his wife.Marland Kitchen  [name], MD 7/10/20189:07 AM

## 2016-08-20 ENCOUNTER — Other Ambulatory Visit: Payer: Self-pay | Admitting: *Deleted

## 2016-08-20 DIAGNOSIS — C9000 Multiple myeloma not having achieved remission: Secondary | ICD-10-CM

## 2016-08-20 MED ORDER — IXAZOMIB CITRATE 3 MG PO CAPS: ORAL_CAPSULE | ORAL | 4 refills | Status: DC

## 2016-08-20 MED ORDER — POMALIDOMIDE 3 MG PO CAPS: 3.0000 mg | ORAL_CAPSULE | Freq: Every day | ORAL | 0 refills | Status: DC

## 2016-08-28 ENCOUNTER — Ambulatory Visit (HOSPITAL_BASED_OUTPATIENT_CLINIC_OR_DEPARTMENT_OTHER): Payer: PPO | Admitting: Hematology & Oncology

## 2016-08-28 ENCOUNTER — Other Ambulatory Visit (HOSPITAL_BASED_OUTPATIENT_CLINIC_OR_DEPARTMENT_OTHER): Payer: PPO

## 2016-08-28 VITALS — BP 139/71 | HR 65 | Temp 97.6°F | Resp 16 | Wt 218.0 lb

## 2016-08-28 DIAGNOSIS — C9002 Multiple myeloma in relapse: Secondary | ICD-10-CM

## 2016-08-28 DIAGNOSIS — C9000 Multiple myeloma not having achieved remission: Secondary | ICD-10-CM

## 2016-08-28 DIAGNOSIS — Z9484 Stem cells transplant status: Secondary | ICD-10-CM | POA: Diagnosis not present

## 2016-08-28 LAB — CBC WITH DIFFERENTIAL (CANCER CENTER ONLY)
BASO#: 0 10*3/uL (ref 0.0–0.2)
BASO%: 1.1 % (ref 0.0–2.0)
EOS ABS: 0.1 10*3/uL (ref 0.0–0.5)
EOS%: 3.6 % (ref 0.0–7.0)
HCT: 37.6 % — ABNORMAL LOW (ref 38.7–49.9)
HGB: 12.5 g/dL — ABNORMAL LOW (ref 13.0–17.1)
LYMPH#: 1.3 10*3/uL (ref 0.9–3.3)
LYMPH%: 47.1 % (ref 14.0–48.0)
MCH: 34.4 pg — AB (ref 28.0–33.4)
MCHC: 33.2 g/dL (ref 32.0–35.9)
MCV: 104 fL — AB (ref 82–98)
MONO#: 0.6 10*3/uL (ref 0.1–0.9)
MONO%: 23 % — AB (ref 0.0–13.0)
NEUT#: 0.7 10*3/uL — ABNORMAL LOW (ref 1.5–6.5)
NEUT%: 25.2 % — AB (ref 40.0–80.0)
PLATELETS: 114 10*3/uL — AB (ref 145–400)
RBC: 3.63 10*6/uL — AB (ref 4.20–5.70)
RDW: 14.2 % (ref 11.1–15.7)
WBC: 2.8 10*3/uL — AB (ref 4.0–10.0)

## 2016-08-28 LAB — CMP (CANCER CENTER ONLY)
ALT(SGPT): 28 U/L (ref 10–47)
AST: 24 U/L (ref 11–38)
Albumin: 3.4 g/dL (ref 3.3–5.5)
Alkaline Phosphatase: 41 U/L (ref 26–84)
BUN: 11 mg/dL (ref 7–22)
CHLORIDE: 101 meq/L (ref 98–108)
CO2: 30 meq/L (ref 18–33)
CREATININE: 0.9 mg/dL (ref 0.6–1.2)
Calcium: 9.8 mg/dL (ref 8.0–10.3)
GLUCOSE: 106 mg/dL (ref 73–118)
POTASSIUM: 4.1 meq/L (ref 3.3–4.7)
SODIUM: 143 meq/L (ref 128–145)
TOTAL PROTEIN: 7.8 g/dL (ref 6.4–8.1)
Total Bilirubin: 0.5 mg/dl (ref 0.20–1.60)

## 2016-08-28 MED ORDER — ONDANSETRON HCL 4 MG PO TABS: ORAL_TABLET | ORAL | 2 refills | Status: DC

## 2016-08-28 NOTE — Progress Notes (Signed)
Hematology and Oncology Follow Up Visit  Gene Hill [id number] 07/06/1940 76 y.o. 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 is back for followup. I think he is doing pretty well with the restart of the Ninlaro/Pomalidomide protocol. He did have an episode of nausea. He does not have any nausea medicine at home. I went ahead and gave me prescription for Zofran.  Otherwise he's been very busy in his garden. Has a huge garden. He is an organic gardener. He always brings [place] vegetables.  His been no fever. He's had no rashes. He's had no problem with bowels or bladder. There's been no diarrhea.  We last saw him, [name] was 1.2 g/dL. His IgA level was 1774 milligrams per deciliter. His Kappa Lightchain was 4 mg/dL.  His right knee is doing quite well. His surgery for this back in November. He really has recovered well.  Thankfully, his wife has been doing well.   Overall, I would say that his performance status is ECOG 1.  Medications:  Current Outpatient Prescriptions:  .  Cinnamon 500 MG TABS, Take 500 mg by mouth daily., Disp: , Rfl:  .  aspirin 325 MG tablet, Take 325 mg by mouth daily., Disp: , Rfl:  .  Cholecalciferol (VITAMIN D3) 2000 units capsule, Take 2,000 Units by mouth daily. , Disp: , Rfl:  .  cyclobenzaprine (FLEXERIL) 10 MG tablet, Take 1 tablet (10 mg total) by mouth at bedtime as needed for muscle spasms., Disp: 90 tablet, Rfl: 0 .  ixazomib citrate (NINLARO) 3 MG capsule, Take 1 capsule weekly for 3 weeks on and 1 week off., Disp: 3 capsule, Rfl: 4 .  lidocaine (XYLOCAINE) 5 % ointment, Apply 1 application topically 2 (two) times daily as needed. Mix w/  OTC Hydrocortisone 1 %, Disp: 35.44 g, Rfl: 1 .  metoprolol tartrate (LOPRESSOR) 100 MG tablet, Take 0.5 tablets (50 mg total) by mouth 2 (two) times daily., Disp: 90 tablet, Rfl: 2 .  Multiple Vitamin (MULTIVITAMIN) tablet, Take 1 tablet by mouth daily., Disp: , Rfl:  .  Omega-3 Fatty Acids (FISH OIL) 1200 MG CAPS, Take 1,200 mg by mouth daily., Disp: , Rfl:  .  pantoprazole (PROTONIX) 40 MG tablet, Take 1 tablet (40 mg total) by mouth daily., Disp: 90 tablet, Rfl: 2 .  pomalidomide (POMALYST) 3 MG capsule, Take 1 capsule (3 mg total) by mouth daily. Take with water on days 1-21. Repeat every 28 days. [id number], Disp: 21 capsule, Rfl: 0 .  Probiotic Product (PROBIOTIC PO), Take 1 tablet by mouth daily. , Disp: , Rfl:  .  traMADol (ULTRAM) 50 MG tablet, Take 0.5-1 tablets (25-50 mg total) by mouth 2 (two) times daily as needed., Disp: 120 tablet, Rfl: 0  Allergies: No Known Allergies  Past Medical History, Surgical history, Social history, and Family History were reviewed and updated.  Review of Systems: As above  Physical Exam:  weight is 218 lb (98.9 kg). His oral temperature is 97.6  F (36.4 C). His blood pressure is 139/71 and his pulse is 65. His respiration is 16 and oxygen saturation is 97%.  Well-developed and well-nourished white gentleman. Head and neck exam shows no ocular or oral lesions. He has no palpable lymph nodes. Lungs are clear. Cardiac exam regular rate and rhythm with no murmurs rubs or bruits. Abdomen is soft. He's mildly obese. He has good bowel sounds. There is no fluid wave. There is no palpable liver or spleen tip. Back exam shows no tenderness over the spine ribs or hips. Extremities shows the healing surgical scars on the right knee. He has some slight swelling of the right leg. He has negative Homans sign for either leg. No venous cords noted in either leg. Skin exam no rashes. Neurological exam is nonfocal.  Lab Results  Component Value Date   WBC 2.8 (L)  08/28/2016   HGB 12.5 (L) 08/28/2016   HCT 37.6 (L) 08/28/2016   MCV 104 (H) 08/28/2016   PLT 114 (L) 08/28/2016     Chemistry      Component Value Date/Time   NA 143 08/28/2016 0803   NA 139 06/14/2016 1246   K 4.1 08/28/2016 0803   K 3.8 06/14/2016 1246   CL 101 08/28/2016 0803   CO2 30 08/28/2016 0803   CO2 24 06/14/2016 1246   BUN 11 08/28/2016 0803   BUN 15.0 06/14/2016 1246   CREATININE 0.9 08/28/2016 0803   CREATININE 0.9 06/14/2016 1246      Component Value Date/Time   CALCIUM 9.8 08/28/2016 0803   CALCIUM 9.6 06/14/2016 1246   ALKPHOS 41 08/28/2016 0803   ALKPHOS 54 06/14/2016 1246   AST 24 08/28/2016 0803   AST 25 06/14/2016 1246   ALT 28 08/28/2016 0803   ALT 21 06/14/2016 1246   BILITOT 0.50 08/28/2016 0803   BILITOT 0.37 06/14/2016 1246       Impression and Plan: Gene Hill is a 76 year old gentleman with history of IgA kappa myeloma. 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 He was treated with standard therapy and got into remission. 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  His relapsed myeloma is certainly high-risk given the chromosomal abnormalities..  It will be very interesting to see what his myeloma studies look like. Hopefully, we will be seeing a response.  I told him to hold off on treatment for 1 additional week. I think his neutrophils are just too low to be treated right now.  We'll see him back in another 4 weeks.  He is not due for Xgeva until October.  [name], MD 8/7/20188:55 AM

## 2016-08-29 LAB — IGG, IGA, IGM
IgG, Qn, Serum: 477 mg/dL — ABNORMAL LOW (ref 700–1600)
IgM, Qn, Serum: 13 mg/dL — ABNORMAL LOW (ref 15–143)

## 2016-08-29 LAB — KAPPA/LAMBDA LIGHT CHAINS
IG KAPPA FREE LIGHT CHAIN: 48.1 mg/L — AB (ref 3.3–19.4)
Ig Lambda Free Light Chain: 10.1 mg/L (ref 5.7–26.3)
Kappa/Lambda FluidC Ratio: 4.76 — ABNORMAL HIGH (ref 0.26–1.65)

## 2016-08-31 LAB — PROTEIN ELECTROPHORESIS, SERUM, WITH REFLEX
A/G RATIO SPE: 0.9 (ref 0.7–1.7)
Albumin: 3.6 g/dL (ref 2.9–4.4)
Alpha 1: 0.2 g/dL (ref 0.0–0.4)
Alpha 2: 0.8 g/dL (ref 0.4–1.0)
BETA: 2.3 g/dL — AB (ref 0.7–1.3)
GAMMA GLOBULIN: 0.5 g/dL (ref 0.4–1.8)
GLOBULIN, TOTAL: 3.9 g/dL (ref 2.2–3.9)
Interpretation(See Below): 0
M-Spike, %: 1.3 g/dL — ABNORMAL HIGH
TOTAL PROTEIN: 7.5 g/dL (ref 6.0–8.5)

## 2016-09-04 ENCOUNTER — Telehealth: Payer: Self-pay | Admitting: *Deleted

## 2016-09-04 NOTE — Telephone Encounter (Addendum)
Patient is aware of results  ----- Message from [name], MD sent at 09/03/2016  5:17 PM EDT ----- Call - the myeloma level went from 1.2 to 1.3.  This is relatively stable for me!!!  No need to change treatment yet!!  The green beans and tomatoes and cucumbers were VERY delicious!!  Gene Hill

## 2016-09-06 ENCOUNTER — Other Ambulatory Visit: Payer: Self-pay | Admitting: *Deleted

## 2016-09-06 DIAGNOSIS — C9000 Multiple myeloma not having achieved remission: Secondary | ICD-10-CM

## 2016-09-06 MED ORDER — POMALIDOMIDE 3 MG PO CAPS: 3.0000 mg | ORAL_CAPSULE | Freq: Every day | ORAL | 0 refills | Status: DC

## 2016-09-17 DIAGNOSIS — H2513 Age-related nuclear cataract, bilateral: Secondary | ICD-10-CM | POA: Diagnosis not present

## 2016-09-17 DIAGNOSIS — H35372 Puckering of macula, left eye: Secondary | ICD-10-CM | POA: Diagnosis not present

## 2016-09-17 DIAGNOSIS — H00029 Hordeolum internum unspecified eye, unspecified eyelid: Secondary | ICD-10-CM | POA: Diagnosis not present

## 2016-09-26 ENCOUNTER — Ambulatory Visit: Payer: PPO | Admitting: *Deleted

## 2016-10-02 ENCOUNTER — Telehealth: Payer: Self-pay

## 2016-10-02 ENCOUNTER — Other Ambulatory Visit (HOSPITAL_BASED_OUTPATIENT_CLINIC_OR_DEPARTMENT_OTHER): Payer: PPO

## 2016-10-02 ENCOUNTER — Other Ambulatory Visit: Payer: Self-pay | Admitting: *Deleted

## 2016-10-02 ENCOUNTER — Ambulatory Visit (HOSPITAL_BASED_OUTPATIENT_CLINIC_OR_DEPARTMENT_OTHER): Payer: PPO | Admitting: Hematology & Oncology

## 2016-10-02 DIAGNOSIS — C9002 Multiple myeloma in relapse: Secondary | ICD-10-CM | POA: Diagnosis not present

## 2016-10-02 DIAGNOSIS — C9000 Multiple myeloma not having achieved remission: Secondary | ICD-10-CM | POA: Diagnosis not present

## 2016-10-02 DIAGNOSIS — Z9484 Stem cells transplant status: Secondary | ICD-10-CM

## 2016-10-02 LAB — CMP (CANCER CENTER ONLY)
ALT(SGPT): 26 U/L (ref 10–47)
AST: 26 U/L (ref 11–38)
Albumin: 3.3 g/dL (ref 3.3–5.5)
Alkaline Phosphatase: 40 U/L (ref 26–84)
BUN: 14 mg/dL (ref 7–22)
CHLORIDE: 101 meq/L (ref 98–108)
CO2: 30 meq/L (ref 18–33)
CREATININE: 0.9 mg/dL (ref 0.6–1.2)
Calcium: 9.1 mg/dL (ref 8.0–10.3)
Glucose, Bld: 102 mg/dL (ref 73–118)
Potassium: 4 mEq/L (ref 3.3–4.7)
SODIUM: 140 meq/L (ref 128–145)
TOTAL PROTEIN: 7.2 g/dL (ref 6.4–8.1)
Total Bilirubin: 0.6 mg/dl (ref 0.20–1.60)

## 2016-10-02 LAB — CBC WITH DIFFERENTIAL (CANCER CENTER ONLY)
BASO#: 0 10*3/uL (ref 0.0–0.2)
BASO%: 0.7 % (ref 0.0–2.0)
EOS%: 4.1 % (ref 0.0–7.0)
Eosinophils Absolute: 0.1 10*3/uL (ref 0.0–0.5)
HEMATOCRIT: 35.6 % — AB (ref 38.7–49.9)
HGB: 11.7 g/dL — ABNORMAL LOW (ref 13.0–17.1)
LYMPH#: 1 10*3/uL (ref 0.9–3.3)
LYMPH%: 37.6 % (ref 14.0–48.0)
MCH: 34.6 pg — ABNORMAL HIGH (ref 28.0–33.4)
MCHC: 32.9 g/dL (ref 32.0–35.9)
MCV: 105 fL — ABNORMAL HIGH (ref 82–98)
MONO#: 0.7 10*3/uL (ref 0.1–0.9)
MONO%: 24.4 % — ABNORMAL HIGH (ref 0.0–13.0)
NEUT#: 0.9 10*3/uL — ABNORMAL LOW (ref 1.5–6.5)
NEUT%: 33.2 % — AB (ref 40.0–80.0)
PLATELETS: 110 10*3/uL — AB (ref 145–400)
RBC: 3.38 10*6/uL — ABNORMAL LOW (ref 4.20–5.70)
RDW: 15 % (ref 11.1–15.7)
WBC: 2.7 10*3/uL — ABNORMAL LOW (ref 4.0–10.0)

## 2016-10-02 MED ORDER — POMALIDOMIDE 2 MG PO CAPS: 2.0000 mg | ORAL_CAPSULE | Freq: Every day | ORAL | 6 refills | Status: DC

## 2016-10-02 MED ORDER — TRAMADOL HCL 50 MG PO TABS: 25.0000 mg | ORAL_TABLET | Freq: Two times a day (BID) | ORAL | 0 refills | Status: DC | PRN

## 2016-10-02 NOTE — Telephone Encounter (Signed)
Rx faxed to [facility] pharmacy.  

## 2016-10-02 NOTE — Progress Notes (Signed)
Hematology and Oncology Follow Up Visit  Gene Hill [id number] [birth year] 76 y.o. 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  Muldrew is back for followup. He is having some problems with the Pomalidomide. I think that the 3 mg dose is just too much for him. We may have to decrease him to 2 mg daily.  His last myeloma studies in August showed M spike of 1.3 g/dL. His IgA level was 1559 milligrams per deciliter. His kappa light chain was 4.8 mg/dL. All this is holding pretty steady.   He's having some arthralgias. He's having some upper chest wall discomfort. There is some shortness of breath. A lot of this is from the humid weather.  He's had a more difficult time working outside. A lot of this is from the humidity.  He's not been able to walk as much.  His knee is doing well. His right knee was operated on back in November of last year. He's really recovered nicely.  He's had no fever. He's had no bleeding.  He was constipated for 2 weeks. He did not take anything for this.  Overall, his performance status is ECOG 1. .  Medications:  Current Outpatient Prescriptions:  .  aspirin 325 MG tablet, Take 325 mg by mouth daily., Disp: , Rfl:  .  Cholecalciferol (VITAMIN D3) 2000 units capsule, Take 2,000 Units by mouth daily. , Disp: , Rfl:  .  cyclobenzaprine (FLEXERIL) 10 MG tablet, Take 1 tablet (10 mg total) by mouth at bedtime as needed for muscle spasms., Disp: 90 tablet, Rfl: 0 .  ixazomib citrate (NINLARO) 3 MG capsule, Take 1 capsule weekly for 3 weeks on and 1 week off., Disp: 3 capsule, Rfl: 4 .  lidocaine (XYLOCAINE) 5 % ointment, Apply 1 application topically 2 (two)  times daily as needed. Mix w/ OTC Hydrocortisone 1 %, Disp: 35.44 g, Rfl: 1 .  metoprolol tartrate (LOPRESSOR) 100 MG tablet, Take 0.5 tablets (50 mg total) by mouth 2 (two) times daily., Disp: 90 tablet, Rfl: 2 .  Multiple Vitamin (MULTIVITAMIN) tablet, Take 1 tablet by mouth daily., Disp: , Rfl:  .  Omega-3 Fatty Acids (FISH OIL) 1200 MG CAPS, Take 1,200 mg by mouth daily., Disp: , Rfl:  .  ondansetron (ZOFRAN) 4 MG tablet, Take 1 pill 1 hour before Ninlaro, then every 8 hrs prn nausea., Disp: 30 tablet, Rfl: 2 .  pantoprazole (PROTONIX) 40 MG tablet, Take 1 tablet (40 mg total) by mouth daily., Disp: 90 tablet, Rfl: 2 .  pomalidomide (POMALYST) 3 MG capsule, Take 1 capsule (3 mg total) by mouth daily. Take with water on days 1-21. Repeat every 28 days. [id number], Disp: 21 capsule, Rfl: 0 .  Probiotic Product (PROBIOTIC PO), Take 1 tablet by mouth daily. , Disp: , Rfl:  .  traMADol (ULTRAM) 50 MG tablet, Take 0.5-1 tablets (25-50 mg total) by mouth 2 (two) times daily as needed., Disp: 120 tablet, Rfl: 0  Allergies: No Known Allergies  Past Medical  History, Surgical history, Social history, and Family History were reviewed and updated.  Review of Systems:as stated in the interim history  Phyical Exam:  weight is 218 lb (98.9 kg). His oral temperature is 98.3 F (36.8 C). His blood pressure is 133/70 and his pulse is 76. His respiration is 18 and oxygen saturation is 97%.    Physical Exam  Constitutional: He is oriented to person, place, and time.  HENT:  Head: Normocephalic and atraumatic.  Mouth/Throat: Oropharynx is clear and moist.  Eyes: Pupils are equal, round, and reactive to light. EOM are normal.  Neck: Normal range of motion.  Cardiovascular: Normal rate, regular rhythm and normal heart sounds.   Pulmonary/Chest: Effort normal and breath sounds normal.  Abdominal: Soft. Bowel sounds are normal.  Musculoskeletal: Normal range of motion. He exhibits no edema, tenderness or  deformity.  Lymphadenopathy:    He has no cervical adenopathy.  Neurological: He is alert and oriented to person, place, and time.  Skin: Skin is warm and dry. No rash noted. No erythema.  Psychiatric: He has a normal mood and affect. His behavior is normal. Judgment and thought content normal.  Vitals reviewed.    Lab Results  Component Value Date   WBC 2.7 (L) 10/02/2016   HGB 11.7 (L) 10/02/2016   HCT 35.6 (L) 10/02/2016   MCV 105 (H) 10/02/2016   PLT 110 (L) 10/02/2016     Chemistry      Component Value Date/Time   NA 143 08/28/2016 0803   NA 139 06/14/2016 1246   K 4.1 08/28/2016 0803   K 3.8 06/14/2016 1246   CL 101 08/28/2016 0803   CO2 30 08/28/2016 0803   CO2 24 06/14/2016 1246   BUN 11 08/28/2016 0803   BUN 15.0 06/14/2016 1246   CREATININE 0.9 08/28/2016 0803   CREATININE 0.9 06/14/2016 1246      Component Value Date/Time   CALCIUM 9.8 08/28/2016 0803   CALCIUM 9.6 06/14/2016 1246   ALKPHOS 41 08/28/2016 0803   ALKPHOS 54 06/14/2016 1246   AST 24 08/28/2016 0803   AST 25 06/14/2016 1246   ALT 28 08/28/2016 0803   ALT 21 06/14/2016 1246   BILITOT 0.50 08/28/2016 0803   BILITOT 0.37 06/14/2016 1246       Impression and Plan: Mr. Jaggi is a 76 year old gentleman with history of IgA kappa myeloma. 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 He was treated with standard therapy and got into remission. 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  His relapsed myeloma is certainly high-risk given the chromosomal abnormalities..  Hopefully, he will do better with the 2 mg dose. If he discussed a new shipment of the 3 mg dose. I told to take this for 14 days and then hold off. Hopefully, this will be more tolerable.  If he still has problems, that would merit to switch treatment altogether. I certainly would not be adverse to trying him on Daratumumab. Unfortunately, this would require a Port-A-Cath.  I will see back in 4  weeks.  [name], MD 9/11/20188:10 AM

## 2016-10-02 NOTE — Telephone Encounter (Signed)
Okay #120 No refills

## 2016-10-02 NOTE — Telephone Encounter (Signed)
Rx printed, awaiting MD signature.  

## 2016-10-02 NOTE — Telephone Encounter (Signed)
Pt is requesting refill on Tramadol 50mg .   Last OV: 03/22/2016 Last Fill: 06/21/2016 #120 and 0RF UDS: 10/26/2015 Low risk  [place] database printed; no issues noted  Please advise.

## 2016-10-03 LAB — KAPPA/LAMBDA LIGHT CHAINS
Ig Kappa Free Light Chain: 47.9 mg/L — ABNORMAL HIGH (ref 3.3–19.4)
Ig Lambda Free Light Chain: 11.4 mg/L (ref 5.7–26.3)
KAPPA/LAMBDA FLC RATIO: 4.2 — AB (ref 0.26–1.65)

## 2016-10-03 LAB — IGG, IGA, IGM
IGG (IMMUNOGLOBIN G), SERUM: 471 mg/dL — AB (ref 700–1600)
IgM, Qn, Serum: 13 mg/dL — ABNORMAL LOW (ref 15–143)

## 2016-10-05 LAB — PROTEIN ELECTROPHORESIS, SERUM, WITH REFLEX
A/G Ratio: 0.9 (ref 0.7–1.7)
ALPHA 1: 0.2 g/dL (ref 0.0–0.4)
ALPHA 2: 0.8 g/dL (ref 0.4–1.0)
Albumin: 3.3 g/dL (ref 2.9–4.4)
Beta: 1 g/dL (ref 0.7–1.3)
GAMMA GLOBULIN: 1.5 g/dL (ref 0.4–1.8)
GLOBULIN, TOTAL: 3.6 g/dL (ref 2.2–3.9)
INTERPRETATION(SEE BELOW): 0
M-Spike, %: 0.7 g/dL — ABNORMAL HIGH
Total Protein: 6.9 g/dL (ref 6.0–8.5)

## 2016-10-08 ENCOUNTER — Telehealth: Payer: Self-pay

## 2016-10-08 NOTE — Telephone Encounter (Addendum)
-----   Message from [name], MD sent at 10/05/2016  4:52 PM EDT ----- Call - the myeloma protein went down by 40%!!!!  Great job!!!  Gene Hill  Above message given to pt's wife by phone. Voices understanding and appreciation. dph

## 2016-10-15 ENCOUNTER — Other Ambulatory Visit: Payer: Self-pay | Admitting: *Deleted

## 2016-10-15 DIAGNOSIS — C9000 Multiple myeloma not having achieved remission: Secondary | ICD-10-CM

## 2016-10-15 MED ORDER — POMALIDOMIDE 2 MG PO CAPS: 2.0000 mg | ORAL_CAPSULE | Freq: Every day | ORAL | 0 refills | Status: DC

## 2016-10-30 ENCOUNTER — Other Ambulatory Visit: Payer: Self-pay | Admitting: *Deleted

## 2016-10-30 ENCOUNTER — Ambulatory Visit: Payer: PPO

## 2016-10-30 ENCOUNTER — Other Ambulatory Visit (HOSPITAL_BASED_OUTPATIENT_CLINIC_OR_DEPARTMENT_OTHER): Payer: PPO

## 2016-10-30 ENCOUNTER — Ambulatory Visit (HOSPITAL_BASED_OUTPATIENT_CLINIC_OR_DEPARTMENT_OTHER): Payer: PPO | Admitting: Hematology & Oncology

## 2016-10-30 VITALS — BP 139/70 | HR 70 | Temp 98.2°F | Resp 16 | Wt 223.0 lb

## 2016-10-30 DIAGNOSIS — C9000 Multiple myeloma not having achieved remission: Secondary | ICD-10-CM

## 2016-10-30 DIAGNOSIS — Z9484 Stem cells transplant status: Secondary | ICD-10-CM

## 2016-10-30 DIAGNOSIS — C9002 Multiple myeloma in relapse: Secondary | ICD-10-CM

## 2016-10-30 LAB — CMP (CANCER CENTER ONLY)
ALT(SGPT): 26 U/L (ref 10–47)
AST: 25 U/L (ref 11–38)
Albumin: 3.3 g/dL (ref 3.3–5.5)
Alkaline Phosphatase: 38 U/L (ref 26–84)
BUN, Bld: 17 mg/dL (ref 7–22)
CO2: 29 meq/L (ref 18–33)
Calcium: 9.7 mg/dL (ref 8.0–10.3)
Chloride: 104 meq/L (ref 98–108)
Creat: 1 mg/dL (ref 0.6–1.2)
Glucose, Bld: 105 mg/dL (ref 73–118)
Potassium: 4 meq/L (ref 3.3–4.7)
Sodium: 145 meq/L (ref 128–145)
Total Bilirubin: 0.6 mg/dL (ref 0.20–1.60)
Total Protein: 7.6 g/dL (ref 6.4–8.1)

## 2016-10-30 LAB — CBC WITH DIFFERENTIAL (CANCER CENTER ONLY)
BASO#: 0 10*3/uL (ref 0.0–0.2)
BASO%: 1.2 % (ref 0.0–2.0)
EOS%: 3.9 % (ref 0.0–7.0)
Eosinophils Absolute: 0.1 10*3/uL (ref 0.0–0.5)
HCT: 35 % — ABNORMAL LOW (ref 38.7–49.9)
HGB: 11.6 g/dL — ABNORMAL LOW (ref 13.0–17.1)
LYMPH#: 1.2 10*3/uL (ref 0.9–3.3)
LYMPH%: 46.3 % (ref 14.0–48.0)
MCH: 35.2 pg — ABNORMAL HIGH (ref 28.0–33.4)
MCHC: 33.1 g/dL (ref 32.0–35.9)
MCV: 106 fL — ABNORMAL HIGH (ref 82–98)
MONO#: 0.6 10*3/uL (ref 0.1–0.9)
MONO%: 23.5 % — ABNORMAL HIGH (ref 0.0–13.0)
NEUT#: 0.6 10*3/uL — ABNORMAL LOW (ref 1.5–6.5)
NEUT%: 25.1 % — ABNORMAL LOW (ref 40.0–80.0)
Platelets: 131 10*3/uL — ABNORMAL LOW (ref 145–400)
RBC: 3.3 10*6/uL — ABNORMAL LOW (ref 4.20–5.70)
RDW: 15.9 % — ABNORMAL HIGH (ref 11.1–15.7)
WBC: 2.6 10*3/uL — ABNORMAL LOW (ref 4.0–10.0)

## 2016-10-30 MED ORDER — POMALIDOMIDE 2 MG PO CAPS: 2.0000 mg | ORAL_CAPSULE | Freq: Every day | ORAL | 0 refills | Status: DC

## 2016-10-30 NOTE — Progress Notes (Signed)
Hematology and Oncology Follow Up Visit  Gene Hill [id number] [birth year] 76 y.o. 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 is back for followup. His main problem has been the issues with the billing department for the health care system. This really has cause he and his wife and incredible amount of anguish. They have really cause problems and have messed up his finances.  He tells me that he will never take any IV chemotherapy because he does not want to get billed properly and then be called by collection services due to errors that are not of his doing. I find this quite unfortunate. We clearly have options for him that we will work but again I can't appreciate his problem in that he does not want to have the financial burden of treatment when this should not occur.  Otherwise, he is doing okay. He actually will start the 2 mg dose of Pomalidomide on Thursday.  His last myeloma studies showed [name] to be down to 0.7 g/dL. His IgA level was 1539 mg/dL. His Kappa light chain was 4.8 mg/dL.   He has felt a little bit tired. He is still trying to garden.  He's had no fever. He's had no vomiting. He is eating a lot of ice cream. His weight is actually up.  He's had no bleeding. He's had no rashes. He's had no leg swelling.  Overall, his performance status is ECOG 1.  Medications:  Current Outpatient Prescriptions:  .  aspirin 325 MG tablet, Take 325 mg by mouth daily., Disp: , Rfl:  .  Cholecalciferol (VITAMIN D3) 2000 units capsule, Take 2,000 Units by mouth daily. , Disp: , Rfl:  .  cyclobenzaprine (FLEXERIL) 10 MG tablet, Take 1 tablet (10 mg total) by  mouth at bedtime as needed for muscle spasms., Disp: 90 tablet, Rfl: 0 .  ixazomib citrate (NINLARO) 3 MG capsule, Take 1 capsule weekly for 3 weeks on and 1 week off., Disp: 3 capsule, Rfl: 4 .  lidocaine (XYLOCAINE) 5 % ointment, Apply 1 application topically 2 (two) times daily as needed. Mix w/ OTC Hydrocortisone 1 %, Disp: 35.44 g, Rfl: 1 .  metoprolol tartrate (LOPRESSOR) 100 MG tablet, Take 0.5 tablets (50 mg total) by mouth 2 (two) times daily., Disp: 90 tablet, Rfl: 2 .  Multiple Vitamin (MULTIVITAMIN) tablet, Take 1 tablet by mouth daily., Disp: , Rfl:  .  Omega-3 Fatty Acids (FISH OIL) 1200 MG CAPS, Take 1,200 mg by mouth daily., Disp: , Rfl:  .  ondansetron (ZOFRAN) 4 MG tablet, Take 1 pill 1 hour before Ninlaro, then every 8 hrs prn nausea., Disp: 30 tablet, Rfl: 2 .  pantoprazole (PROTONIX) 40 MG tablet, Take 1 tablet (40 mg total) by mouth daily., Disp: 90 tablet, Rfl: 2 .  pomalidomide (POMALYST) 2 MG capsule, Take 1 capsule (2 mg total) by mouth daily. Take with water on days 1-21. Repeat every  28 days. [id number], Disp: 21 capsule, Rfl: 0 .  Probiotic Product (PROBIOTIC PO), Take 1 tablet by mouth daily. , Disp: , Rfl:  .  traMADol (ULTRAM) 50 MG tablet, Take 0.5-1 tablets (25-50 mg total) by mouth 2 (two) times daily as needed., Disp: 120 tablet, Rfl: 0  Allergies: No Known Allergies  Past Medical History, Surgical history, Social history, and Family History were reviewed and updated.  Review of Systems:as stated in the interim history  Phyical Exam:  vitals were not taken for this visit.   Physical Exam  Constitutional: He is oriented to person, place, and time.  HENT:  Head: Normocephalic and atraumatic.  Mouth/Throat: Oropharynx is clear and moist.  Eyes: Pupils are equal, round, and reactive to light. EOM are normal.  Neck: Normal range of motion.  Cardiovascular: Normal rate, regular rhythm and normal heart sounds.   Pulmonary/Chest: Effort normal and breath  sounds normal.  Abdominal: Soft. Bowel sounds are normal.  Musculoskeletal: Normal range of motion. He exhibits no edema, tenderness or deformity.  Lymphadenopathy:    He has no cervical adenopathy.  Neurological: He is alert and oriented to person, place, and time.  Skin: Skin is warm and dry. No rash noted. No erythema.  Psychiatric: He has a normal mood and affect. His behavior is normal. Judgment and thought content normal.  Vitals reviewed.    Lab Results  Component Value Date   WBC 2.6 (L) 10/30/2016   HGB 11.6 (L) 10/30/2016   HCT 35.0 (L) 10/30/2016   MCV 106 (H) 10/30/2016   PLT 131 (L) 10/30/2016     Chemistry      Component Value Date/Time   NA 145 10/30/2016 0743   NA 139 06/14/2016 1246   K 4.0 10/30/2016 0743   K 3.8 06/14/2016 1246   CL 104 10/30/2016 0743   CO2 29 10/30/2016 0743   CO2 24 06/14/2016 1246   BUN 17 10/30/2016 0743   BUN 15.0 06/14/2016 1246   CREATININE 1.0 10/30/2016 0743   CREATININE 0.9 06/14/2016 1246      Component Value Date/Time   CALCIUM 9.7 10/30/2016 0743   CALCIUM 9.6 06/14/2016 1246   ALKPHOS 38 10/30/2016 0743   ALKPHOS 54 06/14/2016 1246   AST 25 10/30/2016 0743   AST 25 06/14/2016 1246   ALT 26 10/30/2016 0743   ALT 21 06/14/2016 1246   BILITOT 0.60 10/30/2016 0743   BILITOT 0.37 06/14/2016 1246       Impression and Plan: Gene Hill is a 76 year old gentleman with history of IgA kappa myeloma. 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 He was treated with standard therapy and got into remission. 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  Again, I feel it is quite unfortunate that he will not take any further therapy if it means that he will get a bill from [facility]. He is tired of the aggravation that he is having to deal with with respect to the billing department. He understands that by not doing any further treatment, that his myeloma will eventually cause him to pass  on.  I feel this is incredibly sad that he has 2 endure the aggravation with respect to billing. This is a huge quality of life issue for him. He just does not want to be dealing with an issue that should never be a problem.  We will have to see what his myeloma numbers are. Hopefully, the decrease in Pomalidomide dose will  help him out.   I would like to see him back in another month or so.     [name], MD 10/9/20188:12 AM

## 2016-10-31 LAB — KAPPA/LAMBDA LIGHT CHAINS
IG KAPPA FREE LIGHT CHAIN: 41.7 mg/L — AB (ref 3.3–19.4)
Ig Lambda Free Light Chain: 8.9 mg/L (ref 5.7–26.3)
KAPPA/LAMBDA FLC RATIO: 4.69 — AB (ref 0.26–1.65)

## 2016-10-31 LAB — IGG, IGA, IGM
IgA, Qn, Serum: 1504 mg/dL — ABNORMAL HIGH (ref 61–437)
IgG, Qn, Serum: 462 mg/dL — ABNORMAL LOW (ref 700–1600)
IgM, Qn, Serum: 13 mg/dL — ABNORMAL LOW (ref 15–143)

## 2016-11-05 LAB — PROTEIN ELECTROPHORESIS, SERUM, WITH REFLEX
A/G Ratio: 0.8 (ref 0.7–1.7)
ALBUMIN: 3.2 g/dL (ref 2.9–4.4)
ALPHA 1: 0.2 g/dL (ref 0.0–0.4)
ALPHA 2: 0.8 g/dL (ref 0.4–1.0)
BETA: 1.2 g/dL (ref 0.7–1.3)
GAMMA GLOBULIN: 1.6 g/dL (ref 0.4–1.8)
Globulin, Total: 3.8 g/dL (ref 2.2–3.9)
Interpretation(See Below): 0
M-SPIKE, %: 0.8 g/dL — AB
Total Protein: 7 g/dL (ref 6.0–8.5)

## 2016-11-06 ENCOUNTER — Telehealth: Payer: Self-pay | Admitting: *Deleted

## 2016-11-06 NOTE — Telephone Encounter (Addendum)
Patient is aware of results  ----- Message from [name], MD sent at 11/05/2016 10:02 AM EDT ----- Call - M-spike is holding steady!!!  pete

## 2016-11-15 ENCOUNTER — Telehealth: Payer: Self-pay | Admitting: Hematology & Oncology

## 2016-11-15 NOTE — Telephone Encounter (Signed)
Oral Oncology Patient Advocate Encounter   Received a email from [place] at [facility] that patient was needing co-pay assistance for his medications thru [employer]. He still has $3720.66 thru [employer] thru 03/05/2017. His co-pay for both his Pomslyst and Kennieth Rad which is $812.95 a month. This will last for several more fills.  Also patient is filling thru [employer].    [place] Patient Advocate [phone] 11/15/2016 9:34 AM

## 2016-11-22 ENCOUNTER — Ambulatory Visit (INDEPENDENT_AMBULATORY_CARE_PROVIDER_SITE_OTHER): Payer: PPO | Admitting: Internal Medicine

## 2016-11-22 ENCOUNTER — Encounter: Payer: Self-pay | Admitting: Internal Medicine

## 2016-11-22 VITALS — BP 122/70 | HR 87 | Temp 98.2°F | Resp 14 | Ht 67.0 in | Wt 225.0 lb

## 2016-11-22 DIAGNOSIS — I10 Essential (primary) hypertension: Secondary | ICD-10-CM

## 2016-11-22 DIAGNOSIS — M1990 Unspecified osteoarthritis, unspecified site: Secondary | ICD-10-CM | POA: Diagnosis not present

## 2016-11-22 DIAGNOSIS — R739 Hyperglycemia, unspecified: Secondary | ICD-10-CM | POA: Diagnosis not present

## 2016-11-22 NOTE — Progress Notes (Signed)
Subjective:    Patient ID: Gene Hill, male    DOB: [birth year], 76 y.o.   MRN: [id number]  DOS:  11/22/2016 Type of visit - description : rov Interval history: MM: Hematology notes reviewed Medications reviewed, good compliance without apparent side effects. Emotionally doing okay. he remains active at home on doing yard work.   Review of Systems Denies chest pain, occasional shortness of breath.  No dysphagia, odynophagia or heartburn  Past Medical History:  Diagnosis Date  . Arthritis   . 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 Blood dyscrasia    low WBC d/t multiple myeloma  . Blood transfusion    hx of  . Carotid bruit    3-11 carotid u/s was  (-)  . 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 GERD (gastroesophageal reflux disease)   . Headache   . Heart murmur   . HYPERLIPIDEMIA 05/03/2006  . HYPERTENSION 05/03/2006  . 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 PEPTIC ULCER DISEASE 07/22/2008   per EGD 6-10 .Marland Kitchen ulcer duodenitis     Past Surgical History:  Procedure Laterality Date  . CERVICAL FUSION    . [place]   right  . ROTATOR CUFF REPAIR     right  . SPINAL FUSION    . TOTAL KNEE ARTHROPLASTY Right 12/23/2015   Procedure: TOTAL KNEE ARTHROPLASTY;  Surgeon: Earlie Server, MD;  Location: [place];  Service: Orthopedics;  Laterality: Right;    Social History   Social History  . Marital status: Married    Spouse name: N/A  . Number of children: 4  . Years of education: N/A   Occupational History  . retired    Social History Main Topics  . Smoking status: Former Smoker    Packs/day: 0.25    Years: 20.00    Types: Cigarettes    Start date: 04/28/1954    Quit date: 11/28/1974  . Smokeless tobacco: Never Used     Comment: quit 35 years ago, 1976  . Alcohol use 0.0 oz/week     Comment: Rarely   . Drug use: No  . Sexual activity:  Not on file   Other Topics Concern  . Not on file   Social History Narrative   Household-- pt, wife, son      Allergies as of 11/22/2016   No Known Allergies     Medication List       Accurate as of 11/22/16 11:59 PM. Always use your most recent med list.          aspirin 325 MG tablet Take 325 mg by mouth daily.   cyclobenzaprine 10 MG tablet Commonly known as:  FLEXERIL Take 1 tablet (10 mg total) by mouth at bedtime as needed for muscle spasms.   Fish Oil 1200 MG Caps Take 1,200 mg by mouth daily.   lidocaine 5 % ointment Commonly known as:  XYLOCAINE Apply 1 application topically 2 (two) times daily as needed. Mix w/ OTC Hydrocortisone 1 %   metoprolol tartrate 100 MG tablet Commonly known as:  LOPRESSOR Take 0.5 tablets (50 mg total) by mouth 2 (two) times daily.   montelukast 10 MG tablet Commonly known as:  SINGULAIR Take 1 tablet daily starting on 11/29/2016   multivitamin tablet Take 1 tablet by mouth daily.   ondansetron 4 MG tablet Commonly known as:  ZOFRAN Take 1 pill 1 hour before Ninlaro, then every 8 hrs prn nausea.   pantoprazole 40 MG tablet Commonly known as:  PROTONIX Take 1 tablet (40 mg total) by mouth daily.   pomalidomide 2 MG capsule Commonly known as:  POMALYST Take 1 capsule (2 mg total) by mouth daily. Take with water on days 1-21. Repeat every 28 days. [id number]   PROBIOTIC PO Take 1 tablet by mouth daily.   traMADol 50 MG tablet Commonly known as:  ULTRAM Take 0.5-1 tablets (25-50 mg total) by mouth 2 (two) times daily as needed.   Vitamin D3 2000 units capsule Take 2,000 Units by mouth daily.          Objective:   Physical Exam BP 122/70 (BP Location: Left Arm, Patient Position: Sitting, Cuff Size: Normal)   Pulse 87   Temp 98.2 F (36.8 C) (Oral)   Resp 14   Ht _0  (1.702 m)   Wt 225 lb (102.1 kg)   SpO2 97%   BMI 35.24 kg/m  General:   Well developed,  NAD.  HEENT:  Normocephalic . Face  symmetric, atraumatic Lungs:  CTA B Normal respiratory effort, no intercostal retractions, no accessory muscle use. Heart: RRR,  no murmur.  No pretibial edema bilaterally  Skin: Not pale. Not jaundice Neurologic:  alert & oriented X3.  Speech normal, gait appropriate for age and unassisted Psych--  Cognition and judgment appear intact.  Cooperative with normal attention span and concentration.  Behavior appropriate. No anxious or depressed appearing.      Assessment & Plan:  Assessment Pre-diabetes HTN Hyperlipidemia (self dc  simva 11-2014)  CAD by cath 1998, medical management, Cardiolite ~ 2007 ? Multiple myeloma -- stem cell transplant 2011, with recurrence as off 01-2015  GI: --Barrett' esophagus EGD 2010, 2013 --PUD 2010  MSK -- generalized aches, pains, chronic (no better after dc simvastatin ~ 11-2014) -- DJD Dr Percell Miller Hypogonadism Carotid bruit, [place] (-) 2011  PLAN  Prediabetes: Last A1c stable.  Recheck in the future HTN: Seems well controlled on metoprolol. MM: Last visit with hematology 10/30/2016,  decided not to pursue further treatment through [facility] but will get IV chemo through hem-onc office per pt.  Emotionally he is doing well. DJD: On Ultram, take usually 1 tablet at bedtime and occasionally ibuprofen.  Controlled today.  No UDS (had some billing issues before and I do not like him to go through that again) Primary CARE: Got a flu shot already, shingrix may be a good option for him, recommend to discuss with hematology. RTC 03-2017 yearly checkup

## 2016-11-22 NOTE — Addendum Note (Signed)
Addended by: Burney Gauze R on: 11/22/2016 05:50 PM   Modules accepted: Orders

## 2016-11-22 NOTE — Progress Notes (Signed)
START ON PATHWAY REGIMEN - Multiple Myeloma and Other Plasma Cell Dyscrasias     A cycle is every 28 days:     Carfilzomib      Carfilzomib      Carfilzomib      Dexamethasone      Dexamethasone   **Always confirm dose/schedule in your pharmacy ordering system**    Patient Characteristics: Relapsed / Refractory, All Lines of Therapy R-ISS Staging: Not Applicable Disease Classification: Relapsed Line of Therapy: Second Line Intent of Therapy: Non-Curative / Palliative Intent, Discussed with Patient

## 2016-11-22 NOTE — Patient Instructions (Signed)
  GO TO THE FRONT DESK Schedule your next appointment for a  Yearly check up by 3 - 2019  Think about [facility]

## 2016-11-22 NOTE — Progress Notes (Signed)
Pre visit review using our clinic review tool, if applicable. No additional management support is needed unless otherwise documented below in the visit note. 

## 2016-11-23 ENCOUNTER — Other Ambulatory Visit: Payer: Self-pay | Admitting: Hematology & Oncology

## 2016-11-23 DIAGNOSIS — C9000 Multiple myeloma not having achieved remission: Secondary | ICD-10-CM

## 2016-11-23 MED ORDER — MONTELUKAST SODIUM 10 MG PO TABS: ORAL_TABLET | ORAL | 1 refills | Status: DC

## 2016-11-23 NOTE — Addendum Note (Signed)
Addended by: [name] on: 11/23/2016 08:37 AM   Modules accepted: Orders

## 2016-11-23 NOTE — Progress Notes (Signed)
DISCONTINUE ON PATHWAY REGIMEN - Multiple Myeloma and Other Plasma Cell Dyscrasias     A cycle is every 28 days:     Carfilzomib      Carfilzomib      Carfilzomib      Dexamethasone      Dexamethasone   **Always confirm dose/schedule in your pharmacy ordering system**    REASON: Toxicities / Adverse Event PRIOR TREATMENT: [id number]: Kd-Weekly (Carfilzomib 20/70 mg/m2 + Dexamethasone 40 mg) q28 Days Until Progression or Unacceptable Toxicity TREATMENT RESPONSE: Partial Response (PR)  START ON PATHWAY REGIMEN - Multiple Myeloma and Other Plasma Cell Dyscrasias     A cycle is every 28 days:     Daratumumab   **Always confirm dose/schedule in your pharmacy ordering system**    Patient Characteristics: Relapsed / Refractory, All Lines of Therapy R-ISS Staging: Not Applicable Disease Classification: Relapsed Line of Therapy: Third Line Intent of Therapy: Non-Curative / Palliative Intent, Discussed with Patient

## 2016-11-23 NOTE — Assessment & Plan Note (Signed)
Prediabetes: Last A1c stable.  Recheck in the future HTN: Seems well controlled on metoprolol. MM: Last visit with hematology 10/30/2016,  decided not to pursue further treatment through [facility] but will get IV chemo through hem-onc office per pt.  Emotionally Gene Hill is doing well. DJD: On Ultram, take usually 1 tablet at bedtime and occasionally ibuprofen.  Controlled today.  No UDS (had some billing issues before and I do not like Gene Hill to go through that again) Primary CARE: Got a flu shot already, shingrix may be a good option for Gene Hill, recommend to discuss with hematology. RTC 03-2017 yearly checkup

## 2016-11-27 ENCOUNTER — Other Ambulatory Visit: Payer: Self-pay | Admitting: Radiology

## 2016-11-29 ENCOUNTER — Other Ambulatory Visit: Payer: Self-pay | Admitting: Hematology & Oncology

## 2016-11-29 ENCOUNTER — Encounter (HOSPITAL_COMMUNITY): Payer: Self-pay

## 2016-11-29 ENCOUNTER — Ambulatory Visit (HOSPITAL_COMMUNITY)
Admission: RE | Admit: 2016-11-29 | Discharge: 2016-11-29 | Disposition: A | Discharge status: Home / Self Care | Payer: PPO | Source: Ambulatory Visit | Attending: Hematology & Oncology | Admitting: Hematology & Oncology

## 2016-11-29 DIAGNOSIS — Z5111 Encounter for antineoplastic chemotherapy: Secondary | ICD-10-CM | POA: Diagnosis not present

## 2016-11-29 DIAGNOSIS — K219 Gastro-esophageal reflux disease without esophagitis: Secondary | ICD-10-CM | POA: Insufficient documentation

## 2016-11-29 DIAGNOSIS — I2510 Atherosclerotic heart disease of native coronary artery without angina pectoris: Secondary | ICD-10-CM | POA: Insufficient documentation

## 2016-11-29 DIAGNOSIS — Z8711 Personal history of peptic ulcer disease: Secondary | ICD-10-CM | POA: Insufficient documentation

## 2016-11-29 DIAGNOSIS — I10 Essential (primary) hypertension: Secondary | ICD-10-CM | POA: Diagnosis not present

## 2016-11-29 DIAGNOSIS — Z7982 Long term (current) use of aspirin: Secondary | ICD-10-CM | POA: Insufficient documentation

## 2016-11-29 DIAGNOSIS — C9000 Multiple myeloma not having achieved remission: Secondary | ICD-10-CM

## 2016-11-29 DIAGNOSIS — M1990 Unspecified osteoarthritis, unspecified site: Secondary | ICD-10-CM | POA: Diagnosis not present

## 2016-11-29 DIAGNOSIS — E785 Hyperlipidemia, unspecified: Secondary | ICD-10-CM | POA: Insufficient documentation

## 2016-11-29 DIAGNOSIS — Z87891 Personal history of nicotine dependence: Secondary | ICD-10-CM | POA: Insufficient documentation

## 2016-11-29 HISTORY — PX: IR FLUORO GUIDE PORT INSERTION RIGHT: IMG5741

## 2016-11-29 HISTORY — PX: IR US GUIDE VASC ACCESS RIGHT: IMG2390

## 2016-11-29 LAB — CBC WITH DIFFERENTIAL/PLATELET
BASOS ABS: 0 10*3/uL (ref 0.0–0.1)
Basophils Relative: 1 %
EOS ABS: 0.1 10*3/uL (ref 0.0–0.7)
Eosinophils Relative: 3 %
HEMATOCRIT: 35.8 % — AB (ref 39.0–52.0)
HEMOGLOBIN: 11.7 g/dL — AB (ref 13.0–17.0)
LYMPHS PCT: 46 %
Lymphs Abs: 1.2 10*3/uL (ref 0.7–4.0)
MCH: 34 pg (ref 26.0–34.0)
MCHC: 32.7 g/dL (ref 30.0–36.0)
MCV: 104.1 fL — ABNORMAL HIGH (ref 78.0–100.0)
MONOS PCT: 21 %
Monocytes Absolute: 0.5 10*3/uL (ref 0.1–1.0)
NEUTROS PCT: 29 %
Neutro Abs: 0.8 10*3/uL — ABNORMAL LOW (ref 1.7–7.7)
Platelets: 123 10*3/uL — ABNORMAL LOW (ref 150–400)
RBC: 3.44 MIL/uL — ABNORMAL LOW (ref 4.22–5.81)
RDW: 15.8 % — ABNORMAL HIGH (ref 11.5–15.5)
WBC: 2.6 10*3/uL — ABNORMAL LOW (ref 4.0–10.5)

## 2016-11-29 LAB — PROTIME-INR
INR: 0.91
PROTHROMBIN TIME: 12.2 s (ref 11.4–15.2)

## 2016-11-29 MED ORDER — MIDAZOLAM HCL 2 MG/2ML IJ SOLN
INTRAMUSCULAR | Status: AC | PRN
  Administered 2016-11-29 (×2): 1 mg via INTRAVENOUS

## 2016-11-29 MED ORDER — FENTANYL CITRATE (PF) 100 MCG/2ML IJ SOLN
INTRAMUSCULAR | Status: AC
  Filled 2016-11-29: qty 2

## 2016-11-29 MED ORDER — SODIUM CHLORIDE 0.9 % IV SOLN
INTRAVENOUS | Status: DC
  Administered 2016-11-29: 11:00:00 via INTRAVENOUS

## 2016-11-29 MED ORDER — CEFAZOLIN SODIUM-DEXTROSE 2-4 GM/100ML-% IV SOLN
2.0000 g | INTRAVENOUS | Status: AC
  Administered 2016-11-29: 2 g via INTRAVENOUS

## 2016-11-29 MED ORDER — CEFAZOLIN SODIUM-DEXTROSE 2-4 GM/100ML-% IV SOLN
INTRAVENOUS | Status: AC
  Administered 2016-11-29: 2 g via INTRAVENOUS
  Filled 2016-11-29: qty 100

## 2016-11-29 MED ORDER — HEPARIN SOD (PORK) LOCK FLUSH 100 UNIT/ML IV SOLN
INTRAVENOUS | Status: AC
  Filled 2016-11-29: qty 5

## 2016-11-29 MED ORDER — LIDOCAINE-EPINEPHRINE (PF) 2 %-1:200000 IJ SOLN
INTRAMUSCULAR | Status: AC
  Filled 2016-11-29: qty 20

## 2016-11-29 MED ORDER — FENTANYL CITRATE (PF) 100 MCG/2ML IJ SOLN
INTRAMUSCULAR | Status: AC | PRN
  Administered 2016-11-29 (×2): 50 ug via INTRAVENOUS

## 2016-11-29 MED ORDER — LIDOCAINE-EPINEPHRINE (PF) 1 %-1:200000 IJ SOLN
INTRAMUSCULAR | Status: AC | PRN
  Administered 2016-11-29: 20 mL

## 2016-11-29 MED ORDER — MIDAZOLAM HCL 2 MG/2ML IJ SOLN
INTRAMUSCULAR | Status: AC
  Filled 2016-11-29: qty 4

## 2016-11-29 NOTE — Procedures (Signed)
  Procedure: R IJ Port catheter placement   Preprocedure diagnosis: Mult myeloma Postprocedure diagnosis: same EBL:   minimal Complications:  none immediate  See full dictation in [facility].  Dillard Cannon MD Main # [phone] Pager  [phone]

## 2016-11-29 NOTE — Consult Note (Signed)
Chief Complaint: Patient was seen in consultation today for Port-A-Cath placement  Referring Physician(s): Ennever,Peter R  Supervising Physician: Arne Cleveland  Patient Status: [facility] - Out-pt  History of Present Illness: Gene Hill is a 76 y.o. male with history of relapsing IgA kappa myeloma who presents today for Port-A-Cath placement for additional chemotherapy.  Past Medical History:  Diagnosis Date  . Arthritis   . 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 Blood dyscrasia    low WBC d/t multiple myeloma  . Blood transfusion    hx of  . Carotid bruit    3-11 carotid u/s was  (-)  . 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 GERD (gastroesophageal reflux disease)   . Headache   . Heart murmur   . HYPERLIPIDEMIA 05/03/2006  . HYPERTENSION 05/03/2006  . 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 PEPTIC ULCER DISEASE 07/22/2008   per EGD 6-10 .Marland Kitchen ulcer duodenitis     Past Surgical History:  Procedure Laterality Date  . CERVICAL FUSION    . Jet   right  . ROTATOR CUFF REPAIR     right  . SPINAL FUSION      Allergies: Patient has no known allergies.  Medications: Prior to Admission medications   Medication Sig Start Date End Date Taking? Authorizing Provider  aspirin 325 MG tablet Take 325 mg by mouth daily.   Yes [provider]  cyclobenzaprine (FLEXERIL) 10 MG tablet Take 1 tablet (10 mg total) by mouth at bedtime as needed for muscle spasms. 06/20/16  Yes Paz, Alda Berthold, MD  lidocaine (XYLOCAINE) 5 % ointment Apply 1 application topically 2 (two) times daily as needed. Mix w/ OTC Hydrocortisone 1 % 12/14/15  Yes Paz, Alda Berthold, MD  metoprolol tartrate (LOPRESSOR) 100 MG tablet Take 0.5 tablets (50 mg total) by mouth 2 (two) times daily. 06/20/16  Yes Paz, Alda Berthold, MD  Multiple Vitamin (MULTIVITAMIN) tablet Take 1 tablet by mouth  daily.   Yes [provider]  Omega-3 Fatty Acids (FISH OIL) 1200 MG CAPS Take 1,200 mg by mouth daily.   Yes [provider]  ondansetron (ZOFRAN) 4 MG tablet Take 1 pill 1 hour before Ninlaro, then every 8 hrs prn nausea. 08/28/16  Yes [name], MD  pantoprazole (PROTONIX) 40 MG tablet Take 1 tablet (40 mg total) by mouth daily. 06/20/16  Yes Paz, Alda Berthold, MD  Probiotic Product (PROBIOTIC PO) Take 1 tablet by mouth daily.    Yes [provider]  traMADol (ULTRAM) 50 MG tablet Take 0.5-1 tablets (25-50 mg total) by mouth 2 (two) times daily as needed. 10/02/16  Yes Paz, Alda Berthold, MD  Cholecalciferol (VITAMIN D3) 2000 units capsule Take 2,000 Units by mouth daily.     [provider]  montelukast (SINGULAIR) 10 MG tablet TAKE 1 TABLET BY MOUTH EVERY DAY STARTING ON 11/29/16 11/23/16   [name], MD  pomalidomide (POMALYST) 2 MG capsule Take 1 capsule (2 mg total) by mouth daily. Take with water on days 1-21. Repeat every 28 days. [id number] 10/30/16   [name], MD     Family History  Problem Relation Age of Onset  . Kidney cancer Father   . Leukemia Mother   . Pulmonary embolism Brother   . Stroke Maternal Grandmother   . Heart attack Maternal Grandfather   .  Stroke Paternal Grandmother   . Cerebral palsy Son   . Prostate cancer Neg Hx   . Colon cancer Neg Hx   . Esophageal cancer Neg Hx   . Rectal cancer Neg Hx   . Stomach cancer Neg Hx     Social History   Socioeconomic History  . Marital status: Married    Spouse name: None  . Number of children: 4  . Years of education: None  . Highest education level: None  Social Needs  . Financial resource strain: None  . Food insecurity - worry: None  . Food insecurity - inability: None  . Transportation needs - medical: None  . Transportation needs - non-medical: None  Occupational History  . Occupation: retired  Tobacco Use  . Smoking status: Former Smoker    Packs/day: 0.25     Years: 20.00    Pack years: 5.00    Types: Cigarettes    Start date: 04/28/1954    Last attempt to quit: 11/28/1974    Years since quitting: 42.0  . Smokeless tobacco: Never Used  . Tobacco comment: quit 35 years ago, 1976  Substance and Sexual Activity  . Alcohol use: Yes    Alcohol/week: 0.0 oz    Comment: Rarely   . Drug use: No  . Sexual activity: None  Other Topics Concern  . None  Social History Narrative   Household-- pt, wife, son      Review of Systems currently denies fever, headache, chest pain, dyspnea, cough, abdominal pain, vomiting or abnormal bleeding.  Does have occasional back pain and some intermittent nausea.  Vital Signs: BP (!) 166/89   Pulse 89   Temp 98.7 F (37.1 C) (Oral)   Resp 20   Ht 5' 6"  (1.676 m)   Wt 223 lb 9.6 oz (101.4 kg)   SpO2 99%   BMI 36.09 kg/m   Physical Exam awake, alert.  Chest clear to auscultation bilaterally.  Heart with regular rate and rhythm.  Abdomen soft, protuberant, positive bowel sounds, nontender.  No lower extremity edema  Imaging: No results found.  Labs:  CBC: Recent Labs    07/26/16 1021 08/28/16 0803 10/02/16 0747 10/30/16 0743  WBC 3.8* 2.8* 2.7* 2.6*  HGB 13.2 12.5* 11.7* 11.6*  HCT 39.8 37.6* 35.6* 35.0*  PLT 160 114* 110* 131*    COAGS: Recent Labs    12/12/15 1055 11/29/16 1020  INR 1.02 0.91  APTT 27  --     BMP: Recent Labs    12/12/15 1055 12/24/15 0615  07/26/16 1021 08/28/16 0803 10/02/16 0747 10/30/16 0743  NA 141 139   < > 140 143 140 145  K 4.0 3.9   < > 3.9 4.1 4.0 4.0  CL 105 105   < > 103 101 101 104  CO2 27 25   < > 27 30 30 29   GLUCOSE 100* 108*   < > 120* 106 102 105  BUN 11 14   < > 14 11 14 17   CALCIUM 9.3 8.7*   < > 9.4 9.8 9.1 9.7  CREATININE 0.87 0.90   < > 0.9 0.9 0.9 1.0  GFRNONAA >60 >60  --   --   --   --   --   GFRAA >60 >60  --   --   --   --   --    < > = values in this interval not displayed.    LIVER FUNCTION TESTS: Recent Labs  07/26/16 1021 08/28/16 0803 10/02/16 0747 10/30/16 0743  BILITOT 0.60 0.50 0.60 0.60  AST 26 24 26 25   ALT 28 28 26 26   ALKPHOS 38 41 40 38  PROT 7.7  7.8 7.5  7.8 6.9  7.2 7.0  7.6  ALBUMIN 3.5 3.4 3.3 3.3    TUMOR MARKERS: No results for input(s): AFPTM, CEA, CA199, CHROMGRNA in the last 8760 hours.  Assessment and Plan: 76 y.o. male with history of relapsing IgA kappa myeloma who presents today for Port-A-Cath placement for additional chemotherapy.Risks and benefits discussed with the patient/spouse including, but not limited to bleeding, infection, pneumothorax, or fibrin sheath development and need for additional procedures. All of the patient's questions were answered, patient is agreeable to proceed.Consent signed and in chart.Labs pend.     Thank you for this interesting consult.  I greatly enjoyed meeting Gene Hill and look forward to participating in their care.  A copy of this report was sent to the requesting provider on this date.  Electronically Signed: D. Rowe Robert, PA-C 11/29/2016, 11:08 AM   I spent a total of 25 minutes  in face to face in clinical consultation, greater than 50% of which was counseling/coordinating care for Port-A-Cath placement

## 2016-11-29 NOTE — Discharge Instructions (Signed)
Implanted Port Home Guide °An implanted port is a type of central line that is placed under the skin. Central lines are used to provide IV access when treatment or nutrition needs to be given through a person’s veins. Implanted ports are used for long-term IV access. An implanted port may be placed because: °· You need IV medicine that would be irritating to the small veins in your hands or arms. °· You need long-term IV medicines, such as antibiotics. °· You need IV nutrition for a long period. °· You need frequent blood draws for lab tests. °· You need dialysis. ° °Implanted ports are usually placed in the chest area, but they can also be placed in the upper arm, the abdomen, or the leg. An implanted port has two main parts: °· Reservoir. The reservoir is round and will appear as a small, raised area under your skin. The reservoir is the part where a needle is inserted to give medicines or draw blood. °· Catheter. The catheter is a thin, flexible tube that extends from the reservoir. The catheter is placed into a large vein. Medicine that is inserted into the reservoir goes into the catheter and then into the vein. ° °How will I care for my incision site? °Do not get the incision site wet. Bathe or shower as directed by your health care provider. °How is my port accessed? °Special steps must be taken to access the port: °· Before the port is accessed, a numbing cream can be placed on the skin. This helps numb the skin over the port site. °· Your health care provider uses a sterile technique to access the port. °? Your health care provider must put on a mask and sterile gloves. °? The skin over your port is cleaned carefully with an antiseptic and allowed to dry. °? The port is gently pinched between sterile gloves, and a needle is inserted into the port. °· Only "non-coring" port needles should be used to access the port. Once the port is accessed, a blood return should be checked. This helps ensure that the port  is in the vein and is not clogged. °· If your port needs to remain accessed for a constant infusion, a clear (transparent) bandage will be placed over the needle site. The bandage and needle will need to be changed every week, or as directed by your health care provider. °· Keep the bandage covering the needle clean and dry. Do not get it wet. Follow your health care provider’s instructions on how to take a shower or bath while the port is accessed. °· If your port does not need to stay accessed, no bandage is needed over the port. ° °What is flushing? °Flushing helps keep the port from getting clogged. Follow your health care provider’s instructions on how and when to flush the port. Ports are usually flushed with saline solution or a medicine called heparin. The need for flushing will depend on how the port is used. °· If the port is used for intermittent medicines or blood draws, the port will need to be flushed: °? After medicines have been given. °? After blood has been drawn. °? As part of routine maintenance. °· If a constant infusion is running, the port may not need to be flushed. ° °How long will my port stay implanted? °The port can stay in for as long as your health care provider thinks it is needed. When it is time for the port to come out, surgery will be   done to remove it. The procedure is similar to the one performed when the port was put in. °When should I seek immediate medical care? °When you have an implanted port, you should seek immediate medical care if: °· You notice a bad smell coming from the incision site. °· You have swelling, redness, or drainage at the incision site. °· You have more swelling or pain at the port site or the surrounding area. °· You have a fever that is not controlled with medicine. ° °This information is not intended to replace advice given to you by your health care provider. Make sure you discuss any questions you have with your health care provider. °Document  Released: 01/08/2005 Document Revised: 06/16/2015 Document Reviewed: 09/15/2012 °Elsevier Interactive Patient Education © 2017 [employer] °Moderate Conscious Sedation, Adult, Care After °These instructions provide you with information about caring for yourself after your procedure. Your health care provider may also give you more specific instructions. Your treatment has been planned according to current medical practices, but problems sometimes occur. Call your health care provider if you have any problems or questions after your procedure. °What can I expect after the procedure? °After your procedure, it is common: °· To feel sleepy for several hours. °· To feel clumsy and have poor balance for several hours. °· To have poor judgment for several hours. °· To vomit if you eat too soon. ° °Follow these instructions at home: °For at least 24 hours after the procedure: ° °· Do not: °? Participate in activities where you could fall or become injured. °? Drive. °? Use heavy machinery. °? Drink alcohol. °? Take sleeping pills or medicines that cause drowsiness. °? Make important decisions or sign legal documents. °? Take care of children on your own. °· Rest. °Eating and drinking °· Follow the diet recommended by your health care provider. °· If you vomit: °? Drink water, juice, or soup when you can drink without vomiting. °? Make sure you have little or no nausea before eating solid foods. °General instructions °· Have a responsible adult stay with you until you are awake and alert. °· Take over-the-counter and prescription medicines only as told by your health care provider. °· If you smoke, do not smoke without supervision. °· Keep all follow-up visits as told by your health care provider. This is important. °Contact a health care provider if: °· You keep feeling nauseous or you keep vomiting. °· You feel light-headed. °· You develop a rash. °· You have a fever. °Get help right away if: °· You have trouble  breathing. °This information is not intended to replace advice given to you by your health care provider. Make sure you discuss any questions you have with your health care provider. °Document Released: 10/29/2012 Document Revised: 06/13/2015 Document Reviewed: 04/30/2015 °Elsevier Interactive Patient Education © 2018 Else[employer] Inc. ° °

## 2016-12-04 ENCOUNTER — Ambulatory Visit (HOSPITAL_BASED_OUTPATIENT_CLINIC_OR_DEPARTMENT_OTHER): Payer: PPO

## 2016-12-04 ENCOUNTER — Other Ambulatory Visit: Payer: Self-pay | Admitting: Family

## 2016-12-04 ENCOUNTER — Other Ambulatory Visit: Payer: Self-pay

## 2016-12-04 ENCOUNTER — Ambulatory Visit (HOSPITAL_COMMUNITY)
Admission: RE | Admit: 2016-12-04 | Discharge: 2016-12-04 | Disposition: A | Discharge status: Home / Self Care | Payer: PPO | Source: Ambulatory Visit | Attending: Hematology & Oncology | Admitting: Hematology & Oncology

## 2016-12-04 ENCOUNTER — Encounter: Payer: Self-pay | Admitting: Hematology & Oncology

## 2016-12-04 ENCOUNTER — Ambulatory Visit (HOSPITAL_BASED_OUTPATIENT_CLINIC_OR_DEPARTMENT_OTHER): Payer: PPO | Admitting: Hematology & Oncology

## 2016-12-04 ENCOUNTER — Other Ambulatory Visit (HOSPITAL_BASED_OUTPATIENT_CLINIC_OR_DEPARTMENT_OTHER): Payer: PPO

## 2016-12-04 ENCOUNTER — Ambulatory Visit: Payer: PPO

## 2016-12-04 VITALS — BP 151/80 | HR 97 | Temp 98.3°F | Resp 18

## 2016-12-04 VITALS — BP 137/74 | HR 69 | Temp 98.3°F | Resp 17 | Wt 225.0 lb

## 2016-12-04 DIAGNOSIS — C9000 Multiple myeloma not having achieved remission: Secondary | ICD-10-CM

## 2016-12-04 DIAGNOSIS — Z95828 Presence of other vascular implants and grafts: Secondary | ICD-10-CM

## 2016-12-04 DIAGNOSIS — C9002 Multiple myeloma in relapse: Secondary | ICD-10-CM

## 2016-12-04 DIAGNOSIS — Z9484 Stem cells transplant status: Secondary | ICD-10-CM | POA: Diagnosis not present

## 2016-12-04 DIAGNOSIS — Z5112 Encounter for antineoplastic immunotherapy: Secondary | ICD-10-CM

## 2016-12-04 LAB — CMP (CANCER CENTER ONLY)
ALT(SGPT): 29 U/L (ref 10–47)
AST: 22 U/L (ref 11–38)
Albumin: 3.5 g/dL (ref 3.3–5.5)
Alkaline Phosphatase: 46 U/L (ref 26–84)
BUN, Bld: 11 mg/dL (ref 7–22)
CALCIUM: 9.3 mg/dL (ref 8.0–10.3)
CHLORIDE: 105 meq/L (ref 98–108)
CO2: 27 meq/L (ref 18–33)
Creat: 1.3 mg/dl — ABNORMAL HIGH (ref 0.6–1.2)
GLUCOSE: 105 mg/dL (ref 73–118)
POTASSIUM: 3.9 meq/L (ref 3.3–4.7)
Sodium: 145 mEq/L (ref 128–145)
Total Bilirubin: 0.5 mg/dl (ref 0.20–1.60)
Total Protein: 7.6 g/dL (ref 6.4–8.1)

## 2016-12-04 LAB — CBC WITH DIFFERENTIAL (CANCER CENTER ONLY)
BASO#: 0.1 10*3/uL (ref 0.0–0.2)
BASO%: 2.6 % — AB (ref 0.0–2.0)
EOS%: 4.3 % (ref 0.0–7.0)
Eosinophils Absolute: 0.1 10*3/uL (ref 0.0–0.5)
HEMATOCRIT: 35.3 % — AB (ref 38.7–49.9)
HEMOGLOBIN: 11.5 g/dL — AB (ref 13.0–17.1)
LYMPH#: 0.8 10*3/uL — AB (ref 0.9–3.3)
LYMPH%: 36.1 % (ref 14.0–48.0)
MCH: 34.8 pg — ABNORMAL HIGH (ref 28.0–33.4)
MCHC: 32.6 g/dL (ref 32.0–35.9)
MCV: 107 fL — AB (ref 82–98)
MONO#: 0.6 10*3/uL (ref 0.1–0.9)
MONO%: 25.2 % — ABNORMAL HIGH (ref 0.0–13.0)
NEUT%: 31.8 % — ABNORMAL LOW (ref 40.0–80.0)
NEUTROS ABS: 0.7 10*3/uL — AB (ref 1.5–6.5)
Platelets: 154 10*3/uL (ref 145–400)
RBC: 3.3 10*6/uL — AB (ref 4.20–5.70)
RDW: 15.7 % (ref 11.1–15.7)
WBC: 2.3 10*3/uL — AB (ref 4.0–10.0)

## 2016-12-04 LAB — PRETREATMENT RBC PHENOTYPE

## 2016-12-04 MED ORDER — SODIUM CHLORIDE 0.9% FLUSH
10.0000 mL | INTRAVENOUS | Status: DC | PRN
  Administered 2016-12-04: 10 mL
  Filled 2016-12-04: qty 10

## 2016-12-04 MED ORDER — HEPARIN SOD (PORK) LOCK FLUSH 100 UNIT/ML IV SOLN
500.0000 [IU] | Freq: Once | INTRAVENOUS | Status: AC
  Administered 2016-12-04: 500 [IU] via INTRAVENOUS
  Filled 2016-12-04: qty 5

## 2016-12-04 MED ORDER — METHYLPREDNISOLONE SODIUM SUCC 125 MG IJ SOLR
INTRAMUSCULAR | Status: AC
  Filled 2016-12-04: qty 2

## 2016-12-04 MED ORDER — HEPARIN SOD (PORK) LOCK FLUSH 100 UNIT/ML IV SOLN
500.0000 [IU] | Freq: Once | INTRAVENOUS | Status: AC | PRN
  Administered 2016-12-04: 500 [IU]
  Filled 2016-12-04: qty 5

## 2016-12-04 MED ORDER — DIPHENHYDRAMINE HCL 25 MG PO CAPS
ORAL_CAPSULE | ORAL | Status: AC
  Filled 2016-12-04: qty 2

## 2016-12-04 MED ORDER — ACETAMINOPHEN 325 MG PO TABS
ORAL_TABLET | ORAL | Status: AC
  Filled 2016-12-04: qty 2

## 2016-12-04 MED ORDER — SODIUM CHLORIDE 0.9% FLUSH
10.0000 mL | INTRAVENOUS | Status: DC | PRN
  Administered 2016-12-04: 10 mL via INTRAVENOUS
  Filled 2016-12-04: qty 10

## 2016-12-04 MED ORDER — PROCHLORPERAZINE MALEATE 10 MG PO TABS
ORAL_TABLET | ORAL | Status: AC
  Filled 2016-12-04: qty 1

## 2016-12-04 MED ORDER — PROCHLORPERAZINE MALEATE 10 MG PO TABS
10.0000 mg | ORAL_TABLET | Freq: Once | ORAL | Status: AC
  Administered 2016-12-04: 10 mg via ORAL

## 2016-12-04 MED ORDER — SODIUM CHLORIDE 0.9 % IV SOLN
Freq: Once | INTRAVENOUS | Status: AC
  Administered 2016-12-04: 09:00:00 via INTRAVENOUS

## 2016-12-04 MED ORDER — METHYLPREDNISOLONE SODIUM SUCC 125 MG IJ SOLR
125.0000 mg | Freq: Once | INTRAMUSCULAR | Status: AC
  Administered 2016-12-04: 125 mg via INTRAVENOUS

## 2016-12-04 MED ORDER — SODIUM CHLORIDE 0.9 % IV SOLN
15.6000 mg/kg | Freq: Once | INTRAVENOUS | Status: AC
  Administered 2016-12-04: 1600 mg via INTRAVENOUS
  Filled 2016-12-04: qty 80

## 2016-12-04 MED ORDER — ACETAMINOPHEN 325 MG PO TABS
650.0000 mg | ORAL_TABLET | Freq: Once | ORAL | Status: AC
  Administered 2016-12-04: 650 mg via ORAL

## 2016-12-04 MED ORDER — DIPHENHYDRAMINE HCL 25 MG PO CAPS
50.0000 mg | ORAL_CAPSULE | Freq: Once | ORAL | Status: AC
  Administered 2016-12-04: 50 mg via ORAL

## 2016-12-04 NOTE — Patient Instructions (Signed)
Implanted Port Home Guide An implanted port is a type of central line that is placed under the skin. Central lines are used to provide IV access when treatment or nutrition needs to be given through a person's veins. Implanted ports are used for long-term IV access. An implanted port may be placed because:  You need IV medicine that would be irritating to the small veins in your hands or arms.  You need long-term IV medicines, such as antibiotics.  You need IV nutrition for a long period.  You need frequent blood draws for lab tests.  You need dialysis.  Implanted ports are usually placed in the chest area, but they can also be placed in the upper arm, the abdomen, or the leg. An implanted port has two main parts:  Reservoir. The reservoir is round and will appear as a small, raised area under your skin. The reservoir is the part where a needle is inserted to give medicines or draw blood.  Catheter. The catheter is a thin, flexible tube that extends from the reservoir. The catheter is placed into a large vein. Medicine that is inserted into the reservoir goes into the catheter and then into the vein.  How will I care for my incision site? Do not get the incision site wet. Bathe or shower as directed by your health care provider. How is my port accessed? Special steps must be taken to access the port:  Before the port is accessed, a numbing cream can be placed on the skin. This helps numb the skin over the port site.  Your health care provider uses a sterile technique to access the port. ? Your health care provider must put on a mask and sterile gloves. ? The skin over your port is cleaned carefully with an antiseptic and allowed to dry. ? The port is gently pinched between sterile gloves, and a needle is inserted into the port.  Only "non-coring" port needles should be used to access the port. Once the port is accessed, a blood return should be checked. This helps ensure that the port  is in the vein and is not clogged.  If your port needs to remain accessed for a constant infusion, a clear (transparent) bandage will be placed over the needle site. The bandage and needle will need to be changed every week, or as directed by your health care provider.  Keep the bandage covering the needle clean and dry. Do not get it wet. Follow your health care provider's instructions on how to take a shower or bath while the port is accessed.  If your port does not need to stay accessed, no bandage is needed over the port.  What is flushing? Flushing helps keep the port from getting clogged. Follow your health care provider's instructions on how and when to flush the port. Ports are usually flushed with saline solution or a medicine called heparin. The need for flushing will depend on how the port is used.  If the port is used for intermittent medicines or blood draws, the port will need to be flushed: ? After medicines have been given. ? After blood has been drawn. ? As part of routine maintenance.  If a constant infusion is running, the port may not need to be flushed.  How long will my port stay implanted? The port can stay in for as long as your health care provider thinks it is needed. When it is time for the port to come out, surgery will be   done to remove it. The procedure is similar to the one performed when the port was put in. When should I seek immediate medical care? When you have an implanted port, you should seek immediate medical care if:  You notice a bad smell coming from the incision site.  You have swelling, redness, or drainage at the incision site.  You have more swelling or pain at the port site or the surrounding area.  You have a fever that is not controlled with medicine.  This information is not intended to replace advice given to you by your health care provider. Make sure you discuss any questions you have with your health care provider. Document  Released: 01/08/2005 Document Revised: 06/16/2015 Document Reviewed: 09/15/2012 Elsevier Interactive Patient Education  2017 Elsevier Inc.  

## 2016-12-04 NOTE — Progress Notes (Addendum)
Hematology and Oncology Follow Up Visit  Gene Hill [id number] Feb 15, 1940 76 y.o. 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 is back for followup.  We are going to go ahead and start him on Daratumumab.  I think we need to given his myeloma studies.  His last myeloma studies showed [name] to be 0.8 g/dL.  His IgA level was 1500 mg/dL.  His Kappa Lightchain was 4.2 mg/dL.  I think that he is having a tough time with the Ninlaro and pomalidomide.  We decreased his pomalidomide dose.  We then increased it.  He just was having a difficult quality of life with this.  I had a Port-A-Cath put into him.  This was done without difficulties.  He does have high risk cytogenetics.  Hopefully, the Daratumumab will overcome this.  He has had no problems with fever.  He has had some weight gain.  He really needs to be careful with what he is eating.  He has had no nausea or vomiting.  He has had no leg swelling.  He has had no rashes.  About a year ago, he had surgery for his right knee.  He had right knee replacement.  He has done very well with this.  Overall, his performance status is ECOG 1.  Medications:  Current Outpatient Medications:  .  aspirin 325 MG tablet, Take 325 mg by mouth daily., Disp: , Rfl:  .  Cholecalciferol (VITAMIN D3) 2000 units capsule, Take 2,000 Units by mouth daily. , Disp: , Rfl:  .  cyclobenzaprine (FLEXERIL) 10 MG tablet, Take 1 tablet (10 mg total) by mouth at bedtime as needed for muscle spasms., Disp: 90 tablet, Rfl: 0 .  lidocaine (XYLOCAINE) 5 % ointment, Apply 1 application topically 2 (two) times daily as needed. Mix w/ OTC Hydrocortisone 1 %, Disp: 35.44 g, Rfl: 1 .  metoprolol tartrate (LOPRESSOR) 100 MG tablet,  Take 0.5 tablets (50 mg total) by mouth 2 (two) times daily., Disp: 90 tablet, Rfl: 2 .  montelukast (SINGULAIR) 10 MG tablet, TAKE 1 TABLET BY MOUTH EVERY DAY STARTING ON 11/29/16, Disp: 90 tablet, Rfl: 1 .  Multiple Vitamin (MULTIVITAMIN) tablet, Take 1 tablet by mouth daily., Disp: , Rfl:  .  Omega-3 Fatty Acids (FISH OIL) 1200 MG CAPS, Take 1,200 mg by mouth daily., Disp: , Rfl:  .  ondansetron (ZOFRAN) 4 MG tablet, Take 1 pill 1 hour before Ninlaro, then every 8 hrs prn nausea., Disp: 30 tablet, Rfl: 2 .  pantoprazole (PROTONIX) 40 MG tablet, Take 1 tablet (40 mg total) by mouth daily., Disp: 90 tablet, Rfl: 2 .  pomalidomide (POMALYST) 2 MG capsule, Take 1 capsule (2 mg total) by mouth daily. Take with water on days 1-21. Repeat every 28 days. [id number], Disp: 21 capsule, Rfl: 0 .  Probiotic Product (PROBIOTIC PO), Take 1 tablet by mouth daily. , Disp: , Rfl:  .  traMADol (ULTRAM) 50 MG tablet, Take 0.5-1 tablets (25-50 mg total) by mouth 2 (two) times daily as needed., Disp: 120 tablet, Rfl: 0 No current facility-administered medications for this visit.   Facility-Administered Medications Ordered in Other Visits:  .  heparin lock flush 100 unit/mL, 500 Units, Intracatheter, Once PRN, Brittani Purdum, Rudell Cobb, MD .  sodium chloride flush (NS) 0.9 % injection 10 mL, 10 mL, Intracatheter, PRN, Marin Olp, Rudell Cobb, MD  Allergies: No Known Allergies  Past Medical History, Surgical history, Social history, and Family History were reviewed and updated.  Review of Systems:as stated in the interim history  Phyical Exam:  weight is 225 lb (102.1 kg). His oral temperature is 98.3 F (36.8 C). His blood pressure is 137/74 and his pulse is 69. His respiration is 17 and oxygen saturation is 98%.    Physical Exam  Constitutional: He is oriented to person, place, and time.  HENT:  Head: Normocephalic and atraumatic.  Mouth/Throat: Oropharynx is clear and moist.  Eyes: EOM are normal. Pupils are equal,  round, and reactive to light.  Neck: Normal range of motion.  Cardiovascular: Normal rate, regular rhythm and normal heart sounds.  Pulmonary/Chest: Effort normal and breath sounds normal.  Abdominal: Soft. Bowel sounds are normal.  Musculoskeletal: Normal range of motion. He exhibits no edema, tenderness or deformity.  Lymphadenopathy:    He has no cervical adenopathy.  Neurological: He is alert and oriented to person, place, and time.  Skin: Skin is warm and dry. No rash noted. No erythema.  Psychiatric: He has a normal mood and affect. His behavior is normal. Judgment and thought content normal.  Vitals reviewed.    Lab Results  Component Value Date   WBC 2.3 (L) 12/04/2016   HGB 11.5 (L) 12/04/2016   HCT 35.3 (L) 12/04/2016   MCV 107 (H) 12/04/2016   PLT 154 12/04/2016     Chemistry      Component Value Date/Time   NA 145 12/04/2016 0801   NA 139 06/14/2016 1246   K 3.9 12/04/2016 0801   K 3.8 06/14/2016 1246   CL 105 12/04/2016 0801   CO2 27 12/04/2016 0801   CO2 24 06/14/2016 1246   BUN 11 12/04/2016 0801   BUN 15.0 06/14/2016 1246   CREATININE 1.3 (H) 12/04/2016 0801   CREATININE 0.9 06/14/2016 1246      Component Value Date/Time   CALCIUM 9.3 12/04/2016 0801   CALCIUM 9.6 06/14/2016 1246   ALKPHOS 46 12/04/2016 0801   ALKPHOS 54 06/14/2016 1246   AST 22 12/04/2016 0801   AST 25 06/14/2016 1246   ALT 29 12/04/2016 0801   ALT 21 06/14/2016 1246   BILITOT 0.50 12/04/2016 0801   BILITOT 0.37 06/14/2016 1246       Impression and Plan: Gene Hill is a 76 year old gentleman with history of IgA kappa myeloma. 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 He was treated with standard therapy and got into remission. 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  Thankfully, there was a lot of hard work done by quite a few people to get all of the financial issues taking care of.  Because of this, he will agree to undergo therapy.  I  am just incredibly grateful for the help of so many people to try to get him to have treatment.  We will go ahead and get started with the Daratumumab today.  He will continue his pomalidomide at 2 mg daily dose.  He will have weekly treatment for 8 weeks and then every other week treatment for another 8 weeks.  We will see him back ourselves in about 3 or 4 weeks.    [name], MD 11/13/20181:50 PM

## 2016-12-04 NOTE — Patient Instructions (Addendum)
[facility] Discharge Instructions for Patients Receiving Chemotherapy  Today you received the following chemotherapy agents Darzalex  To help prevent nausea and vomiting after your treatment, we encourage you to take your nausea medication as prescribed.    If you develop nausea and vomiting that is not controlled by your nausea medication, call the clinic.   BELOW ARE SYMPTOMS THAT SHOULD BE REPORTED IMMEDIATELY:  *FEVER GREATER THAN 100.5 F  *CHILLS WITH OR WITHOUT FEVER  NAUSEA AND VOMITING THAT IS NOT CONTROLLED WITH YOUR NAUSEA MEDICATION  *UNUSUAL SHORTNESS OF BREATH  *UNUSUAL BRUISING OR BLEEDING  TENDERNESS IN MOUTH AND THROAT WITH OR WITHOUT PRESENCE OF ULCERS  *URINARY PROBLEMS  *BOWEL PROBLEMS  UNUSUAL RASH Items with * indicate a potential emergency and should be followed up as soon as possible.  Feel free to call the clinic should you have any questions or concerns. The clinic phone number is [phone].  Please show the CHEMO ALERT CARD at check-in to the Emergency Department and triage nurse.  Daratumumab injection (Darzalex) What is this medicine? DARATUMUMAB (dar a toom ue mab) is a monoclonal antibody. It is used to treat multiple myeloma. This medicine may be used for other purposes; ask your health care provider or pharmacist if you have questions. COMMON BRAND NAME(S): DARZALEX What should I tell my health care provider before I take this medicine? They need to know if you have any of these conditions: -infection (especially a virus infection such as chickenpox, cold sores, or herpes) -lung or breathing disease -pregnant or trying to get pregnant -breast-feeding -an unusual or allergic reaction to daratumumab, other medicines, foods, dyes, or preservatives How should I use this medicine? This medicine is for infusion into a vein. It is given by a health care professional in a hospital or clinic setting. Talk to your pediatrician  regarding the use of this medicine in children. Special care may be needed. Overdosage: If you think you have taken too much of this medicine contact a poison control center or emergency room at once. NOTE: This medicine is only for you. Do not share this medicine with others. What if I miss a dose? Keep appointments for follow-up doses as directed. It is important not to miss your dose. Call your doctor or health care professional if you are unable to keep an appointment. What may interact with this medicine? Interactions have not been studied. Give your health care provider a list of all the medicines, herbs, non-prescription drugs, or dietary supplements you use. Also tell them if you smoke, drink alcohol, or use illegal drugs. Some items may interact with your medicine. This list may not describe all possible interactions. Give your health care provider a list of all the medicines, herbs, non-prescription drugs, or dietary supplements you use. Also tell them if you smoke, drink alcohol, or use illegal drugs. Some items may interact with your medicine. What should I watch for while using this medicine? This drug may make you feel generally unwell. Report any side effects. Continue your course of treatment even though you feel ill unless your doctor tells you to stop. This medicine can cause serious allergic reactions. To reduce your risk you may need to take medicine before treatment with this medicine. Take your medicine as directed. This medicine can affect the results of blood tests to match your blood type. These changes can last for up to 6 months after the final dose. Your healthcare provider will do blood tests to match your   blood type before you start treatment. Tell all of your healthcare providers that you are being treated with this medicine before receiving a blood transfusion. This medicine can affect the results of some tests used to determine treatment response; extra tests may be  needed to evaluate response. Do not become pregnant while taking this medicine or for 3 months after stopping it. Women should inform their doctor if they wish to become pregnant or think they might be pregnant. There is a potential for serious side effects to an unborn child. Talk to your health care professional or pharmacist for more information. What side effects may I notice from receiving this medicine? Side effects that you should report to your doctor or health care professional as soon as possible: -allergic reactions like skin rash, itching or hives, swelling of the face, lips, or tongue -breathing problems -chills -cough -dizziness -feeling faint or lightheaded -headache -low blood counts - this medicine may decrease the number of white blood cells, red blood cells and platelets. You may be at increased risk for infections and bleeding. -nausea, vomiting -shortness of breath -signs of decreased platelets or bleeding - bruising, pinpoint red spots on the skin, black, tarry stools, blood in the urine -signs of decreased red blood cells - unusually weak or tired, feeling faint or lightheaded, falls -signs of infection - fever or chills, cough, sore throat, pain or difficulty passing urine Side effects that usually do not require medical attention (report to your doctor or health care professional if they continue or are bothersome): -back pain -diarrhea -muscle cramps -pain, tingling, numbness in the hands or feet -swelling of the ankles, feet, hands -tiredness This list may not describe all possible side effects. Call your doctor for medical advice about side effects. You may report side effects to FDA at 1-800-FDA-1088. Where should I keep my medicine? Keep out of the reach of children. This drug is given in a hospital or clinic and will not be stored at home. NOTE: This sheet is a summary. It may not cover all possible information. If you have questions about this medicine,  talk to your doctor, pharmacist, or health care provider.  2018 Elsevier/Gold Standard (2015-02-10 10:38:11)  

## 2016-12-04 NOTE — Progress Notes (Signed)
Ok to treat with [facility]  Per Dr. Marin Olp

## 2016-12-05 LAB — KAPPA/LAMBDA LIGHT CHAINS
IG KAPPA FREE LIGHT CHAIN: 43.7 mg/L — AB (ref 3.3–19.4)
IG LAMBDA FREE LIGHT CHAIN: 9.4 mg/L (ref 5.7–26.3)
KAPPA/LAMBDA FLC RATIO: 4.65 — AB (ref 0.26–1.65)

## 2016-12-05 LAB — IGG, IGA, IGM
IGG (IMMUNOGLOBIN G), SERUM: 475 mg/dL — AB (ref 700–1600)
IGM (IMMUNOGLOBIN M), SRM: 13 mg/dL — AB (ref 15–143)

## 2016-12-05 LAB — TYPE AND SCREEN
ABO/RH(D): A POS
Antibody Screen: NEGATIVE

## 2016-12-07 LAB — PROTEIN ELECTROPHORESIS, SERUM, WITH REFLEX
A/G Ratio: 0.9 (ref 0.7–1.7)
ALPHA 1: 0.2 g/dL (ref 0.0–0.4)
ALPHA 2: 0.9 g/dL (ref 0.4–1.0)
Albumin: 3.5 g/dL (ref 2.9–4.4)
BETA: 0.8 g/dL (ref 0.7–1.3)
Gamma Globulin: 1.8 g/dL (ref 0.4–1.8)
Globulin, Total: 3.7 g/dL (ref 2.2–3.9)
INTERPRETATION(SEE BELOW): 0
M-SPIKE, %: 1.2 g/dL — AB
Total Protein: 7.2 g/dL (ref 6.0–8.5)

## 2016-12-11 ENCOUNTER — Other Ambulatory Visit (HOSPITAL_BASED_OUTPATIENT_CLINIC_OR_DEPARTMENT_OTHER): Payer: PPO

## 2016-12-11 ENCOUNTER — Ambulatory Visit (HOSPITAL_BASED_OUTPATIENT_CLINIC_OR_DEPARTMENT_OTHER): Payer: PPO

## 2016-12-11 ENCOUNTER — Other Ambulatory Visit: Payer: Self-pay | Admitting: *Deleted

## 2016-12-11 ENCOUNTER — Telehealth: Payer: Self-pay | Admitting: *Deleted

## 2016-12-11 ENCOUNTER — Ambulatory Visit: Payer: PPO

## 2016-12-11 ENCOUNTER — Other Ambulatory Visit: Payer: Self-pay | Admitting: Hematology and Oncology

## 2016-12-11 VITALS — BP 124/76 | HR 94 | Temp 98.7°F | Resp 18

## 2016-12-11 DIAGNOSIS — Z5112 Encounter for antineoplastic immunotherapy: Secondary | ICD-10-CM

## 2016-12-11 DIAGNOSIS — C9002 Multiple myeloma in relapse: Secondary | ICD-10-CM

## 2016-12-11 DIAGNOSIS — C9000 Multiple myeloma not having achieved remission: Secondary | ICD-10-CM

## 2016-12-11 LAB — CMP (CANCER CENTER ONLY)
ALT(SGPT): 26 U/L (ref 10–47)
AST: 21 U/L (ref 11–38)
Albumin: 3.5 g/dL (ref 3.3–5.5)
Alkaline Phosphatase: 49 U/L (ref 26–84)
BILIRUBIN TOTAL: 0.6 mg/dL (ref 0.20–1.60)
BUN, Bld: 13 mg/dL (ref 7–22)
CALCIUM: 9.3 mg/dL (ref 8.0–10.3)
CO2: 29 meq/L (ref 18–33)
Chloride: 103 mEq/L (ref 98–108)
Creat: 0.9 mg/dl (ref 0.6–1.2)
GLUCOSE: 102 mg/dL (ref 73–118)
Potassium: 3.7 mEq/L (ref 3.3–4.7)
SODIUM: 142 meq/L (ref 128–145)
Total Protein: 6.8 g/dL (ref 6.4–8.1)

## 2016-12-11 LAB — CBC WITH DIFFERENTIAL (CANCER CENTER ONLY)
BASO#: 0 10*3/uL (ref 0.0–0.2)
BASO%: 0.6 % (ref 0.0–2.0)
EOS%: 3.3 % (ref 0.0–7.0)
Eosinophils Absolute: 0.1 10*3/uL (ref 0.0–0.5)
HEMATOCRIT: 35.8 % — AB (ref 38.7–49.9)
HGB: 11.7 g/dL — ABNORMAL LOW (ref 13.0–17.1)
LYMPH#: 0.8 10*3/uL — ABNORMAL LOW (ref 0.9–3.3)
LYMPH%: 24 % (ref 14.0–48.0)
MCH: 34.6 pg — ABNORMAL HIGH (ref 28.0–33.4)
MCHC: 32.7 g/dL (ref 32.0–35.9)
MCV: 106 fL — ABNORMAL HIGH (ref 82–98)
MONO#: 0.9 10*3/uL (ref 0.1–0.9)
MONO%: 26.7 % — AB (ref 0.0–13.0)
NEUT#: 1.5 10*3/uL (ref 1.5–6.5)
NEUT%: 45.4 % (ref 40.0–80.0)
Platelets: 139 10*3/uL — ABNORMAL LOW (ref 145–400)
RBC: 3.38 10*6/uL — ABNORMAL LOW (ref 4.20–5.70)
RDW: 15.5 % (ref 11.1–15.7)
WBC: 3.3 10*3/uL — ABNORMAL LOW (ref 4.0–10.0)

## 2016-12-11 MED ORDER — DIPHENHYDRAMINE HCL 25 MG PO CAPS
ORAL_CAPSULE | ORAL | Status: AC
  Filled 2016-12-11: qty 2

## 2016-12-11 MED ORDER — POMALIDOMIDE 2 MG PO CAPS: 2.0000 mg | ORAL_CAPSULE | Freq: Every day | ORAL | 0 refills | Status: DC

## 2016-12-11 MED ORDER — METHYLPREDNISOLONE SODIUM SUCC 125 MG IJ SOLR
INTRAMUSCULAR | Status: AC
  Filled 2016-12-11: qty 2

## 2016-12-11 MED ORDER — ACETAMINOPHEN 325 MG PO TABS
ORAL_TABLET | ORAL | Status: AC
  Filled 2016-12-11: qty 2

## 2016-12-11 MED ORDER — DIPHENHYDRAMINE HCL 25 MG PO CAPS
50.0000 mg | ORAL_CAPSULE | Freq: Once | ORAL | Status: AC
  Administered 2016-12-11: 50 mg via ORAL

## 2016-12-11 MED ORDER — HEPARIN SOD (PORK) LOCK FLUSH 100 UNIT/ML IV SOLN
500.0000 [IU] | Freq: Once | INTRAVENOUS | Status: AC | PRN
  Administered 2016-12-11: 500 [IU]
  Filled 2016-12-11: qty 5

## 2016-12-11 MED ORDER — ACETAMINOPHEN 325 MG PO TABS
650.0000 mg | ORAL_TABLET | Freq: Once | ORAL | Status: AC
  Administered 2016-12-11: 650 mg via ORAL

## 2016-12-11 MED ORDER — SODIUM CHLORIDE 0.9% FLUSH
10.0000 mL | INTRAVENOUS | Status: DC | PRN
  Administered 2016-12-11: 10 mL
  Filled 2016-12-11: qty 10

## 2016-12-11 MED ORDER — PROCHLORPERAZINE MALEATE 10 MG PO TABS
ORAL_TABLET | ORAL | Status: AC
  Filled 2016-12-11: qty 1

## 2016-12-11 MED ORDER — SODIUM CHLORIDE 0.9 % IV SOLN
Freq: Once | INTRAVENOUS | Status: AC
  Administered 2016-12-11: 10:00:00 via INTRAVENOUS

## 2016-12-11 MED ORDER — PROCHLORPERAZINE MALEATE 10 MG PO TABS
10.0000 mg | ORAL_TABLET | Freq: Once | ORAL | Status: AC
  Administered 2016-12-11: 10 mg via ORAL

## 2016-12-11 MED ORDER — METHYLPREDNISOLONE SODIUM SUCC 125 MG IJ SOLR
125.0000 mg | Freq: Once | INTRAMUSCULAR | Status: AC
  Administered 2016-12-11: 125 mg via INTRAVENOUS

## 2016-12-11 MED ORDER — SODIUM CHLORIDE 0.9 % IV SOLN
15.7000 mg/kg | Freq: Once | INTRAVENOUS | Status: AC
  Administered 2016-12-11: 1600 mg via INTRAVENOUS
  Filled 2016-12-11: qty 80

## 2016-12-11 NOTE — Patient Instructions (Signed)
[facility] Discharge Instructions for Patients Receiving Chemotherapy  Today you received the following chemotherapy agents Darzalex.  To help prevent nausea and vomiting after your treatment, we encourage you to take your nausea medication as directed.  If you develop nausea and vomiting that is not controlled by your nausea medication, call the clinic.   BELOW ARE SYMPTOMS THAT SHOULD BE REPORTED IMMEDIATELY:  *FEVER GREATER THAN 100.5 F  *CHILLS WITH OR WITHOUT FEVER  NAUSEA AND VOMITING THAT IS NOT CONTROLLED WITH YOUR NAUSEA MEDICATION  *UNUSUAL SHORTNESS OF BREATH  *UNUSUAL BRUISING OR BLEEDING  TENDERNESS IN MOUTH AND THROAT WITH OR WITHOUT PRESENCE OF ULCERS  *URINARY PROBLEMS  *BOWEL PROBLEMS  UNUSUAL RASH Items with * indicate a potential emergency and should be followed up as soon as possible.  Feel free to call the clinic you have any questions or concerns. The clinic phone number is [phone].  Please show the CHEMO ALERT CARD at check-in to the Emergency Department and triage nurse.    

## 2016-12-11 NOTE — Telephone Encounter (Signed)
Called patient wife to see how he was doing post chemotherapy for Darzalex therapy.  Patient did very well and tolerated well.  Denies complaints at this time.

## 2016-12-11 NOTE — Patient Instructions (Signed)
[facility] Discharge Instructions for Patients Receiving Chemotherapy  Today you received the following chemotherapy agents Darzalex.  To help prevent nausea and vomiting after your treatment, we encourage you to take your nausea medication as directed.  If you develop nausea and vomiting that is not controlled by your nausea medication, call the clinic.   BELOW ARE SYMPTOMS THAT SHOULD BE REPORTED IMMEDIATELY:  *FEVER GREATER THAN 100.5 F  *CHILLS WITH OR WITHOUT FEVER  NAUSEA AND VOMITING THAT IS NOT CONTROLLED WITH YOUR NAUSEA MEDICATION  *UNUSUAL SHORTNESS OF BREATH  *UNUSUAL BRUISING OR BLEEDING  TENDERNESS IN MOUTH AND THROAT WITH OR WITHOUT PRESENCE OF ULCERS  *URINARY PROBLEMS  *BOWEL PROBLEMS  UNUSUAL RASH Items with * indicate a potential emergency and should be followed up as soon as possible.  Feel free to call the clinic you have any questions or concerns. The clinic phone number is [phone].  Please show the CHEMO ALERT CARD at check-in to the Emergency Department and triage nurse.    

## 2016-12-12 LAB — KAPPA/LAMBDA LIGHT CHAINS
Ig Kappa Free Light Chain: 13.8 mg/L (ref 3.3–19.4)
Ig Lambda Free Light Chain: 6.8 mg/L (ref 5.7–26.3)
KAPPA/LAMBDA FLC RATIO: 2.03 — AB (ref 0.26–1.65)

## 2016-12-12 LAB — IGG, IGA, IGM
IGG (IMMUNOGLOBIN G), SERUM: 472 mg/dL — AB (ref 700–1600)
IGM (IMMUNOGLOBIN M), SRM: 10 mg/dL — AB (ref 15–143)

## 2016-12-13 LAB — PROTEIN ELECTROPHORESIS, SERUM, WITH REFLEX
A/G RATIO SPE: 1.1 (ref 0.7–1.7)
ALBUMIN: 3.5 g/dL (ref 2.9–4.4)
ALPHA 1: 0.2 g/dL (ref 0.0–0.4)
Alpha 2: 0.9 g/dL (ref 0.4–1.0)
BETA: 0.9 g/dL (ref 0.7–1.3)
GAMMA GLOBULIN: 1.2 g/dL (ref 0.4–1.8)
Globulin, Total: 3.2 g/dL (ref 2.2–3.9)
Interpretation(See Below): 0
M-SPIKE, %: 0.5 g/dL — AB
TOTAL PROTEIN: 6.7 g/dL (ref 6.0–8.5)

## 2016-12-17 ENCOUNTER — Other Ambulatory Visit: Payer: Self-pay | Admitting: *Deleted

## 2016-12-17 DIAGNOSIS — C9000 Multiple myeloma not having achieved remission: Secondary | ICD-10-CM

## 2016-12-18 ENCOUNTER — Ambulatory Visit (HOSPITAL_BASED_OUTPATIENT_CLINIC_OR_DEPARTMENT_OTHER): Payer: PPO

## 2016-12-18 ENCOUNTER — Other Ambulatory Visit: Payer: Self-pay

## 2016-12-18 ENCOUNTER — Other Ambulatory Visit: Payer: Self-pay | Admitting: *Deleted

## 2016-12-18 ENCOUNTER — Other Ambulatory Visit (HOSPITAL_BASED_OUTPATIENT_CLINIC_OR_DEPARTMENT_OTHER): Payer: PPO

## 2016-12-18 ENCOUNTER — Ambulatory Visit: Payer: PPO

## 2016-12-18 VITALS — BP 134/70 | HR 83 | Temp 97.5°F | Resp 18 | Wt 221.0 lb

## 2016-12-18 DIAGNOSIS — C9000 Multiple myeloma not having achieved remission: Secondary | ICD-10-CM

## 2016-12-18 DIAGNOSIS — C9002 Multiple myeloma in relapse: Secondary | ICD-10-CM

## 2016-12-18 DIAGNOSIS — Z5112 Encounter for antineoplastic immunotherapy: Secondary | ICD-10-CM

## 2016-12-18 LAB — CBC WITH DIFFERENTIAL (CANCER CENTER ONLY)
BASO#: 0 10*3/uL (ref 0.0–0.2)
BASO%: 0.8 % (ref 0.0–2.0)
EOS%: 9.2 % — ABNORMAL HIGH (ref 0.0–7.0)
Eosinophils Absolute: 0.2 10*3/uL (ref 0.0–0.5)
HEMATOCRIT: 37 % — AB (ref 38.7–49.9)
HGB: 12.2 g/dL — ABNORMAL LOW (ref 13.0–17.1)
LYMPH#: 0.7 10*3/uL — AB (ref 0.9–3.3)
LYMPH%: 29.8 % (ref 14.0–48.0)
MCH: 34.7 pg — ABNORMAL HIGH (ref 28.0–33.4)
MCHC: 33 g/dL (ref 32.0–35.9)
MCV: 105 fL — AB (ref 82–98)
MONO#: 0.8 10*3/uL (ref 0.1–0.9)
MONO%: 32.8 % — ABNORMAL HIGH (ref 0.0–13.0)
NEUT#: 0.7 10*3/uL — ABNORMAL LOW (ref 1.5–6.5)
NEUT%: 27.4 % — ABNORMAL LOW (ref 40.0–80.0)
PLATELETS: 119 10*3/uL — AB (ref 145–400)
RBC: 3.52 10*6/uL — AB (ref 4.20–5.70)
RDW: 15 % (ref 11.1–15.7)
WBC: 2.4 10*3/uL — AB (ref 4.0–10.0)

## 2016-12-18 LAB — CMP (CANCER CENTER ONLY)
ALK PHOS: 45 U/L (ref 26–84)
ALT: 23 U/L (ref 10–47)
AST: 19 U/L (ref 11–38)
Albumin: 3.4 g/dL (ref 3.3–5.5)
BILIRUBIN TOTAL: 0.6 mg/dL (ref 0.20–1.60)
BUN: 13 mg/dL (ref 7–22)
CO2: 27 mEq/L (ref 18–33)
CREATININE: 0.8 mg/dL (ref 0.6–1.2)
Calcium: 9.3 mg/dL (ref 8.0–10.3)
Chloride: 103 mEq/L (ref 98–108)
Glucose, Bld: 102 mg/dL (ref 73–118)
POTASSIUM: 4 meq/L (ref 3.3–4.7)
Sodium: 143 mEq/L (ref 128–145)
TOTAL PROTEIN: 6.5 g/dL (ref 6.4–8.1)

## 2016-12-18 MED ORDER — DIPHENHYDRAMINE HCL 25 MG PO CAPS
ORAL_CAPSULE | ORAL | Status: AC
  Filled 2016-12-18: qty 2

## 2016-12-18 MED ORDER — DEXAMETHASONE 2 MG PO TABS: ORAL_TABLET | ORAL | 0 refills | Status: DC

## 2016-12-18 MED ORDER — SODIUM CHLORIDE 0.9% FLUSH
10.0000 mL | INTRAVENOUS | Status: DC | PRN
  Administered 2016-12-18: 10 mL
  Filled 2016-12-18: qty 10

## 2016-12-18 MED ORDER — DIPHENHYDRAMINE HCL 25 MG PO CAPS
50.0000 mg | ORAL_CAPSULE | Freq: Once | ORAL | Status: AC
  Administered 2016-12-18: 50 mg via ORAL

## 2016-12-18 MED ORDER — DARATUMUMAB CHEMO INJECTION 400 MG/20ML
15.7000 mg/kg | Freq: Once | INTRAVENOUS | Status: DC
  Filled 2016-12-18: qty 80

## 2016-12-18 MED ORDER — SODIUM CHLORIDE 0.9 % IV SOLN
15.7000 mg/kg | Freq: Once | INTRAVENOUS | Status: AC
  Administered 2016-12-18: 1600 mg via INTRAVENOUS
  Filled 2016-12-18: qty 80

## 2016-12-18 MED ORDER — SODIUM CHLORIDE 0.9 % IV SOLN
15.7000 mg/kg | Freq: Once | INTRAVENOUS | Status: DC
  Filled 2016-12-18: qty 80

## 2016-12-18 MED ORDER — METHYLPREDNISOLONE SODIUM SUCC 125 MG IJ SOLR
125.0000 mg | Freq: Once | INTRAMUSCULAR | Status: AC
  Administered 2016-12-18: 125 mg via INTRAVENOUS

## 2016-12-18 MED ORDER — SODIUM CHLORIDE 0.9% FLUSH
10.0000 mL | Freq: Once | INTRAVENOUS | Status: AC
  Administered 2016-12-18: 10 mL
  Filled 2016-12-18: qty 10

## 2016-12-18 MED ORDER — DEXAMETHASONE SODIUM PHOSPHATE 10 MG/ML IJ SOLN
INTRAMUSCULAR | Status: AC
  Filled 2016-12-18: qty 1

## 2016-12-18 MED ORDER — ACETAMINOPHEN 325 MG PO TABS
650.0000 mg | ORAL_TABLET | Freq: Once | ORAL | Status: AC
  Administered 2016-12-18: 650 mg via ORAL

## 2016-12-18 MED ORDER — PALONOSETRON HCL INJECTION 0.25 MG/5ML
INTRAVENOUS | Status: AC
  Filled 2016-12-18: qty 5

## 2016-12-18 MED ORDER — PROCHLORPERAZINE MALEATE 10 MG PO TABS
ORAL_TABLET | ORAL | Status: AC
  Filled 2016-12-18: qty 1

## 2016-12-18 MED ORDER — PROCHLORPERAZINE MALEATE 10 MG PO TABS
10.0000 mg | ORAL_TABLET | Freq: Once | ORAL | Status: AC
  Administered 2016-12-18: 10 mg via ORAL

## 2016-12-18 MED ORDER — METHYLPREDNISOLONE SODIUM SUCC 125 MG IJ SOLR
INTRAMUSCULAR | Status: AC
  Filled 2016-12-18: qty 2

## 2016-12-18 MED ORDER — HEPARIN SOD (PORK) LOCK FLUSH 100 UNIT/ML IV SOLN
500.0000 [IU] | Freq: Once | INTRAVENOUS | Status: AC | PRN
  Administered 2016-12-18: 500 [IU]
  Filled 2016-12-18: qty 5

## 2016-12-18 MED ORDER — ACETAMINOPHEN 325 MG PO TABS
ORAL_TABLET | ORAL | Status: AC
  Filled 2016-12-18: qty 2

## 2016-12-18 MED ORDER — SODIUM CHLORIDE 0.9 % IV SOLN
Freq: Once | INTRAVENOUS | Status: AC
  Administered 2016-12-18: 10:00:00 via INTRAVENOUS

## 2016-12-18 NOTE — Patient Instructions (Signed)
Implanted Port Home Guide An implanted port is a type of central line that is placed under the skin. Central lines are used to provide IV access when treatment or nutrition needs to be given through a person's veins. Implanted ports are used for long-term IV access. An implanted port may be placed because:  You need IV medicine that would be irritating to the small veins in your hands or arms.  You need long-term IV medicines, such as antibiotics.  You need IV nutrition for a long period.  You need frequent blood draws for lab tests.  You need dialysis.  Implanted ports are usually placed in the chest area, but they can also be placed in the upper arm, the abdomen, or the leg. An implanted port has two main parts:  Reservoir. The reservoir is round and will appear as a small, raised area under your skin. The reservoir is the part where a needle is inserted to give medicines or draw blood.  Catheter. The catheter is a thin, flexible tube that extends from the reservoir. The catheter is placed into a large vein. Medicine that is inserted into the reservoir goes into the catheter and then into the vein.  How will I care for my incision site? Do not get the incision site wet. Bathe or shower as directed by your health care provider. How is my port accessed? Special steps must be taken to access the port:  Before the port is accessed, a numbing cream can be placed on the skin. This helps numb the skin over the port site.  Your health care provider uses a sterile technique to access the port. ? Your health care provider must put on a mask and sterile gloves. ? The skin over your port is cleaned carefully with an antiseptic and allowed to dry. ? The port is gently pinched between sterile gloves, and a needle is inserted into the port.  Only "non-coring" port needles should be used to access the port. Once the port is accessed, a blood return should be checked. This helps ensure that the port  is in the vein and is not clogged.  If your port needs to remain accessed for a constant infusion, a clear (transparent) bandage will be placed over the needle site. The bandage and needle will need to be changed every week, or as directed by your health care provider.  Keep the bandage covering the needle clean and dry. Do not get it wet. Follow your health care provider's instructions on how to take a shower or bath while the port is accessed.  If your port does not need to stay accessed, no bandage is needed over the port.  What is flushing? Flushing helps keep the port from getting clogged. Follow your health care provider's instructions on how and when to flush the port. Ports are usually flushed with saline solution or a medicine called heparin. The need for flushing will depend on how the port is used.  If the port is used for intermittent medicines or blood draws, the port will need to be flushed: ? After medicines have been given. ? After blood has been drawn. ? As part of routine maintenance.  If a constant infusion is running, the port may not need to be flushed.  How long will my port stay implanted? The port can stay in for as long as your health care provider thinks it is needed. When it is time for the port to come out, surgery will be   done to remove it. The procedure is similar to the one performed when the port was put in. When should I seek immediate medical care? When you have an implanted port, you should seek immediate medical care if:  You notice a bad smell coming from the incision site.  You have swelling, redness, or drainage at the incision site.  You have more swelling or pain at the port site or the surrounding area.  You have a fever that is not controlled with medicine.  This information is not intended to replace advice given to you by your health care provider. Make sure you discuss any questions you have with your health care provider. Document  Released: 01/08/2005 Document Revised: 06/16/2015 Document Reviewed: 09/15/2012 Elsevier Interactive Patient Education  2017 Elsevier Inc.  

## 2016-12-18 NOTE — Patient Instructions (Signed)
[facility] Discharge Instructions for Patients Receiving Chemotherapy  Today you received the following chemotherapy agents:  Darzalex  To help prevent nausea and vomiting after your treatment, we encourage you to take your nausea medication as prescribed.   If you develop nausea and vomiting that is not controlled by your nausea medication, call the clinic.   BELOW ARE SYMPTOMS THAT SHOULD BE REPORTED IMMEDIATELY:  *FEVER GREATER THAN 100.5 F  *CHILLS WITH OR WITHOUT FEVER  NAUSEA AND VOMITING THAT IS NOT CONTROLLED WITH YOUR NAUSEA MEDICATION  *UNUSUAL SHORTNESS OF BREATH  *UNUSUAL BRUISING OR BLEEDING  TENDERNESS IN MOUTH AND THROAT WITH OR WITHOUT PRESENCE OF ULCERS  *URINARY PROBLEMS  *BOWEL PROBLEMS  UNUSUAL RASH Items with * indicate a potential emergency and should be followed up as soon as possible.  Feel free to call the clinic should you have any questions or concerns. The clinic phone number is [phone].  Please show the CHEMO ALERT CARD at check-in to the Emergency Department and triage nurse.   

## 2016-12-18 NOTE — Progress Notes (Signed)
Ok to treat with [facility] 0.7 per Dr. Ennever. 

## 2016-12-18 NOTE — Addendum Note (Signed)
Addended by: Burney Gauze R on: 12/18/2016 10:07 AM   Modules accepted: Orders

## 2016-12-18 NOTE — Addendum Note (Signed)
Addended by: Arbutus Ped on: 12/18/2016 11:16 AM   Modules accepted: Orders

## 2016-12-18 NOTE — Addendum Note (Signed)
Addended by: Perlie Gold on: 12/18/2016 12:55 PM   Modules accepted: Orders

## 2016-12-18 NOTE — Progress Notes (Signed)
Patient states he had backache the day after chemotherapy that lasted until yesterday. Patient doesn't remember injuring the area.

## 2016-12-18 NOTE — Progress Notes (Signed)
Trixie Rude, Pharmacist made aware of back pain. States back pain is a side effect of Darzalex. Dr. Marin Olp made aware of patient's back pain. Patient verbalized understanding.

## 2016-12-24 ENCOUNTER — Other Ambulatory Visit: Payer: Self-pay | Admitting: Internal Medicine

## 2016-12-25 ENCOUNTER — Ambulatory Visit: Payer: PPO

## 2016-12-25 ENCOUNTER — Ambulatory Visit (HOSPITAL_BASED_OUTPATIENT_CLINIC_OR_DEPARTMENT_OTHER): Payer: PPO | Admitting: Hematology & Oncology

## 2016-12-25 ENCOUNTER — Other Ambulatory Visit: Payer: Self-pay

## 2016-12-25 ENCOUNTER — Other Ambulatory Visit (HOSPITAL_BASED_OUTPATIENT_CLINIC_OR_DEPARTMENT_OTHER): Payer: PPO

## 2016-12-25 ENCOUNTER — Ambulatory Visit (HOSPITAL_BASED_OUTPATIENT_CLINIC_OR_DEPARTMENT_OTHER): Payer: PPO

## 2016-12-25 VITALS — BP 131/68 | HR 69 | Temp 97.4°F | Resp 18 | Wt 218.0 lb

## 2016-12-25 VITALS — BP 132/63 | HR 85 | Temp 97.7°F | Resp 18

## 2016-12-25 DIAGNOSIS — C9002 Multiple myeloma in relapse: Secondary | ICD-10-CM

## 2016-12-25 DIAGNOSIS — Z5112 Encounter for antineoplastic immunotherapy: Secondary | ICD-10-CM | POA: Diagnosis not present

## 2016-12-25 DIAGNOSIS — Z9484 Stem cells transplant status: Secondary | ICD-10-CM

## 2016-12-25 DIAGNOSIS — C9000 Multiple myeloma not having achieved remission: Secondary | ICD-10-CM | POA: Diagnosis not present

## 2016-12-25 LAB — CBC WITH DIFFERENTIAL (CANCER CENTER ONLY)
BASO#: 0 10*3/uL (ref 0.0–0.2)
BASO%: 1.1 % (ref 0.0–2.0)
EOS%: 2.5 % (ref 0.0–7.0)
Eosinophils Absolute: 0.1 10*3/uL (ref 0.0–0.5)
HCT: 36.4 % — ABNORMAL LOW (ref 38.7–49.9)
HEMOGLOBIN: 12 g/dL — AB (ref 13.0–17.1)
LYMPH#: 0.9 10*3/uL (ref 0.9–3.3)
LYMPH%: 32.7 % (ref 14.0–48.0)
MCH: 34.6 pg — ABNORMAL HIGH (ref 28.0–33.4)
MCHC: 33 g/dL (ref 32.0–35.9)
MCV: 105 fL — ABNORMAL HIGH (ref 82–98)
MONO#: 0.8 10*3/uL (ref 0.1–0.9)
MONO%: 28.8 % — AB (ref 0.0–13.0)
NEUT%: 34.9 % — ABNORMAL LOW (ref 40.0–80.0)
NEUTROS ABS: 1 10*3/uL — AB (ref 1.5–6.5)
PLATELETS: 137 10*3/uL — AB (ref 145–400)
RBC: 3.47 10*6/uL — ABNORMAL LOW (ref 4.20–5.70)
RDW: 15 % (ref 11.1–15.7)
WBC: 2.8 10*3/uL — AB (ref 4.0–10.0)

## 2016-12-25 LAB — CMP (CANCER CENTER ONLY)
ALK PHOS: 49 U/L (ref 26–84)
ALT: 23 U/L (ref 10–47)
AST: 16 U/L (ref 11–38)
Albumin: 3.4 g/dL (ref 3.3–5.5)
BUN: 17 mg/dL (ref 7–22)
CO2: 28 mEq/L (ref 18–33)
Calcium: 9 mg/dL (ref 8.0–10.3)
Chloride: 100 mEq/L (ref 98–108)
Creat: 1 mg/dl (ref 0.6–1.2)
Glucose, Bld: 96 mg/dL (ref 73–118)
POTASSIUM: 3.7 meq/L (ref 3.3–4.7)
Sodium: 143 mEq/L (ref 128–145)
TOTAL PROTEIN: 6.6 g/dL (ref 6.4–8.1)
Total Bilirubin: 0.6 mg/dl (ref 0.20–1.60)

## 2016-12-25 MED ORDER — SODIUM CHLORIDE 0.9 % IV SOLN
Freq: Once | INTRAVENOUS | Status: AC
  Administered 2016-12-25: 12:00:00 via INTRAVENOUS

## 2016-12-25 MED ORDER — DIPHENHYDRAMINE HCL 25 MG PO CAPS
50.0000 mg | ORAL_CAPSULE | Freq: Once | ORAL | Status: AC
  Administered 2016-12-25: 50 mg via ORAL

## 2016-12-25 MED ORDER — ACETAMINOPHEN 325 MG PO TABS
ORAL_TABLET | ORAL | Status: AC
  Filled 2016-12-25: qty 2

## 2016-12-25 MED ORDER — ACETAMINOPHEN 325 MG PO TABS
650.0000 mg | ORAL_TABLET | Freq: Once | ORAL | Status: AC
  Administered 2016-12-25: 650 mg via ORAL

## 2016-12-25 MED ORDER — PROCHLORPERAZINE MALEATE 10 MG PO TABS
ORAL_TABLET | ORAL | Status: AC
  Filled 2016-12-25: qty 1

## 2016-12-25 MED ORDER — DARATUMUMAB CHEMO INJECTION 400 MG/20ML
15.7000 mg/kg | Freq: Once | INTRAVENOUS | Status: AC
  Administered 2016-12-25: 1600 mg via INTRAVENOUS
  Filled 2016-12-25: qty 80

## 2016-12-25 MED ORDER — SODIUM CHLORIDE 0.9% FLUSH
10.0000 mL | INTRAVENOUS | Status: DC | PRN
  Administered 2016-12-25: 10 mL
  Filled 2016-12-25: qty 10

## 2016-12-25 MED ORDER — PROCHLORPERAZINE MALEATE 10 MG PO TABS
10.0000 mg | ORAL_TABLET | Freq: Once | ORAL | Status: AC
  Administered 2016-12-25: 10 mg via ORAL

## 2016-12-25 MED ORDER — HEPARIN SOD (PORK) LOCK FLUSH 100 UNIT/ML IV SOLN
500.0000 [IU] | Freq: Once | INTRAVENOUS | Status: AC | PRN
  Administered 2016-12-25: 500 [IU]
  Filled 2016-12-25: qty 5

## 2016-12-25 MED ORDER — METHYLPREDNISOLONE SODIUM SUCC 125 MG IJ SOLR
125.0000 mg | Freq: Once | INTRAMUSCULAR | Status: AC
  Administered 2016-12-25: 125 mg via INTRAVENOUS

## 2016-12-25 MED ORDER — METHYLPREDNISOLONE SODIUM SUCC 125 MG IJ SOLR
INTRAMUSCULAR | Status: AC
  Filled 2016-12-25: qty 2

## 2016-12-25 MED ORDER — DIPHENHYDRAMINE HCL 25 MG PO CAPS
ORAL_CAPSULE | ORAL | Status: AC
Start: 2016-12-25 — End: ?
  Filled 2016-12-25: qty 2

## 2016-12-25 NOTE — Addendum Note (Signed)
Addended by: Burney Gauze R on: 12/25/2016 11:46 AM   Modules accepted: Orders

## 2016-12-25 NOTE — Progress Notes (Signed)
Hematology and Oncology Follow Up Visit  Gene Hill [id number] 10-03-1940 76 y.o. 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  He is doing well.  He has already begun to respond.  [name] went from 1.2 g/dL down to 0.5 g/dL.  His IgA level is now down to 1076 mg/dL.  His Kappa Light chain went from 4.4 mg down to 1.4 mg/dL.  He does feel tired.  He feels a little bit fatigued.  I think this might be from the Pomalidomide.  I think that we can probably hold his Pomalidomide right now.  I really do not see much of a problem with holding the Pomalidomide.  He did have a nice Thanksgiving.  He ate pretty well.  He has had no fever.  He has had no cough.  He had a lot of abdominal gas.  I told him to try some Pepto-Bismol.  I also told him to try an over-the-counter antacid.  He has had no complaints of pain.  Overall, his performance status is ECOG 1.  Medications:  Current Outpatient Medications:  .  aspirin 325 MG tablet, Take 325 mg by mouth daily., Disp: , Rfl:  .  Cholecalciferol (VITAMIN D3) 2000 units capsule, Take 2,000 Units by mouth daily. , Disp: , Rfl:  .  cyclobenzaprine (FLEXERIL) 10 MG tablet, Take 1 tablet (10 mg total) by mouth at bedtime as needed for muscle spasms., Disp: 90 tablet, Rfl: 0 .  dexamethasone (DECADRON) 2 MG tablet, Take 4mg  day after chemo then 2mg  daily x 4, Disp: 30 tablet, Rfl: 0 .  lidocaine (XYLOCAINE) 5 % ointment, Apply 1 application topically 2 (two) times daily as needed. Mix w/ OTC Hydrocortisone 1 %, Disp: 35.44 g, Rfl: 1 .  metoprolol tartrate (LOPRESSOR) 100 MG tablet, Take 0.5 tablets (50 mg total) by mouth 2 (two) times daily., Disp:  90 tablet, Rfl: 2 .  montelukast (SINGULAIR) 10 MG tablet, TAKE 1 TABLET BY MOUTH EVERY DAY STARTING ON 11/29/16, Disp: 90 tablet, Rfl: 1 .  Multiple Vitamin (MULTIVITAMIN) tablet, Take 1 tablet by mouth daily., Disp: , Rfl:  .  Omega-3 Fatty Acids (FISH OIL) 1200 MG CAPS, Take 1,200 mg by mouth daily., Disp: , Rfl:  .  ondansetron (ZOFRAN) 4 MG tablet, Take 1 pill 1 hour before [place], then every 8 hrs prn nausea., Disp: 30 tablet, Rfl: 2 .  pantoprazole (PROTONIX) 40 MG tablet, Take 1 tablet (40 mg total) by mouth daily., Disp: 90 tablet, Rfl: 2 .  pomalidomide (POMALYST) 2 MG capsule, Take 1 capsule (2 mg total) by mouth daily. Take with water on days 1-21. Repeat every 28 days. [id number], Disp: 21 capsule, Rfl: 0 .  Probiotic Product (PROBIOTIC PO), Take 1 tablet by mouth daily. , Disp: , Rfl:  .  traMADol (ULTRAM) 50 MG tablet, Take 0.5-1 tablets (25-50 mg total) by mouth 2 (two) times daily as needed., Disp: 120 tablet, Rfl: 0 No current facility-administered medications for this visit.   Facility-Administered  Medications Ordered in Other Visits:  .  daratumumab (DARZALEX) 1,600 mg in sodium chloride 0.9 % 420 mL (3.2 mg/mL) chemo infusion, 15.7 mg/kg (Treatment Plan Recorded), Intravenous, Once, Zita Ozimek R, MD .  sodium chloride flush (NS) 0.9 % injection 10 mL, 10 mL, Intracatheter, PRN, [name], MD, 10 mL at 12/18/16 1250  Allergies: No Known Allergies  Past Medical History, Surgical history, Social history, and Family History were reviewed and updated.  Review of Systems:as stated in the interim history  Phyical Exam:  weight is 218 lb (98.9 kg). His oral temperature is 97.4 F (36.3 C) (abnormal). His blood pressure is 131/68 and his pulse is 69. His respiration is 18 and oxygen saturation is 98%.    Physical Exam  Constitutional: He is oriented to person, place, and time.  HENT:  Head: Normocephalic and atraumatic.  Mouth/Throat: Oropharynx is clear and  moist.  Eyes: EOM are normal. Pupils are equal, round, and reactive to light.  Neck: Normal range of motion.  Cardiovascular: Normal rate, regular rhythm and normal heart sounds.  Pulmonary/Chest: Effort normal and breath sounds normal.  Abdominal: Soft. Bowel sounds are normal.  Musculoskeletal: Normal range of motion. He exhibits no edema, tenderness or deformity.  Lymphadenopathy:    He has no cervical adenopathy.  Neurological: He is alert and oriented to person, place, and time.  Skin: Skin is warm and dry. No rash noted. No erythema.  Psychiatric: He has a normal mood and affect. His behavior is normal. Judgment and thought content normal.  Vitals reviewed.    Lab Results  Component Value Date   WBC 2.8 (L) 12/25/2016   HGB 12.0 (L) 12/25/2016   HCT 36.4 (L) 12/25/2016   MCV 105 (H) 12/25/2016   PLT 137 (L) 12/25/2016     Chemistry      Component Value Date/Time   NA 143 12/25/2016 0939   NA 139 06/14/2016 1246   K 3.7 12/25/2016 0939   K 3.8 06/14/2016 1246   CL 100 12/25/2016 0939   CO2 28 12/25/2016 0939   CO2 24 06/14/2016 1246   BUN 17 12/25/2016 0939   BUN 15.0 06/14/2016 1246   CREATININE 1.0 12/25/2016 0939   CREATININE 0.9 06/14/2016 1246      Component Value Date/Time   CALCIUM 9.0 12/25/2016 0939   CALCIUM 9.6 06/14/2016 1246   ALKPHOS 49 12/25/2016 0939   ALKPHOS 54 06/14/2016 1246   AST 16 12/25/2016 0939   AST 25 06/14/2016 1246   ALT 23 12/25/2016 0939   ALT 21 06/14/2016 1246   BILITOT 0.60 12/25/2016 0939   BILITOT 0.37 06/14/2016 1246       Impression and Plan: [name] is a 76 year old gentleman with history of IgA kappa myeloma. 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 He was treated with standard therapy and got into remission. 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  Again, his response has been very nice to date.  We will just go with single agent daratumumab right now.  I think this  would be very reasonable.  I would like to see him back in another couple weeks.  We will see how he is feeling.  I would like to make sure that he is doing well for Christmas and New Year's.  [name], MD 12/4/201811:14 AM

## 2016-12-25 NOTE — Patient Instructions (Signed)
Daratumumab injection What is this medicine? DARATUMUMAB (dar a toom ue mab) is a monoclonal antibody. It is used to treat multiple myeloma. This medicine may be used for other purposes; ask your health care provider or pharmacist if you have questions. COMMON BRAND NAME(S): DARZALEX What should I tell my health care provider before I take this medicine? They need to know if you have any of these conditions: -infection (especially a virus infection such as chickenpox, cold sores, or herpes) -lung or breathing disease -pregnant or trying to get pregnant -breast-feeding -an unusual or allergic reaction to daratumumab, other medicines, foods, dyes, or preservatives How should I use this medicine? This medicine is for infusion into a vein. It is given by a health care professional in a hospital or clinic setting. Talk to your pediatrician regarding the use of this medicine in children. Special care may be needed. Overdosage: If you think you have taken too much of this medicine contact a poison control center or emergency room at once. NOTE: This medicine is only for you. Do not share this medicine with others. What if I miss a dose? Keep appointments for follow-up doses as directed. It is important not to miss your dose. Call your doctor or health care professional if you are unable to keep an appointment. What may interact with this medicine? Interactions have not been studied. Give your health care provider a list of all the medicines, herbs, non-prescription drugs, or dietary supplements you use. Also tell them if you smoke, drink alcohol, or use illegal drugs. Some items may interact with your medicine. This list may not describe all possible interactions. Give your health care provider a list of all the medicines, herbs, non-prescription drugs, or dietary supplements you use. Also tell them if you smoke, drink alcohol, or use illegal drugs. Some items may interact with your medicine. What  should I watch for while using this medicine? This drug may make you feel generally unwell. Report any side effects. Continue your course of treatment even though you feel ill unless your doctor tells you to stop. This medicine can cause serious allergic reactions. To reduce your risk you may need to take medicine before treatment with this medicine. Take your medicine as directed. This medicine can affect the results of blood tests to match your blood type. These changes can last for up to 6 months after the final dose. Your healthcare provider will do blood tests to match your blood type before you start treatment. Tell all of your healthcare providers that you are being treated with this medicine before receiving a blood transfusion. This medicine can affect the results of some tests used to determine treatment response; extra tests may be needed to evaluate response. Do not become pregnant while taking this medicine or for 3 months after stopping it. Women should inform their doctor if they wish to become pregnant or think they might be pregnant. There is a potential for serious side effects to an unborn child. Talk to your health care professional or pharmacist for more information. What side effects may I notice from receiving this medicine? Side effects that you should report to your doctor or health care professional as soon as possible: -allergic reactions like skin rash, itching or hives, swelling of the face, lips, or tongue -breathing problems -chills -cough -dizziness -feeling faint or lightheaded -headache -low blood counts - this medicine may decrease the number of white blood cells, red blood cells and platelets. You may be at increased risk  for infections and bleeding. -nausea, vomiting -shortness of breath -signs of decreased platelets or bleeding - bruising, pinpoint red spots on the skin, black, tarry stools, blood in the urine -signs of decreased red blood cells - unusually  weak or tired, feeling faint or lightheaded, falls -signs of infection - fever or chills, cough, sore throat, pain or difficulty passing urine Side effects that usually do not require medical attention (report to your doctor or health care professional if they continue or are bothersome): -back pain -diarrhea -muscle cramps -pain, tingling, numbness in the hands or feet -swelling of the ankles, feet, hands -tiredness This list may not describe all possible side effects. Call your doctor for medical advice about side effects. You may report side effects to FDA at 1-800-FDA-1088. Where should I keep my medicine? Keep out of the reach of children. This drug is given in a hospital or clinic and will not be stored at home. NOTE: This sheet is a summary. It may not cover all possible information. If you have questions about this medicine, talk to your doctor, pharmacist, or health care provider.  2018 Elsevier/Gold Standard (2015-02-10 10:38:11)  

## 2016-12-26 ENCOUNTER — Telehealth: Payer: Self-pay | Admitting: *Deleted

## 2016-12-26 LAB — KAPPA/LAMBDA LIGHT CHAINS
Ig Kappa Free Light Chain: 11 mg/L (ref 3.3–19.4)
Ig Lambda Free Light Chain: 6.3 mg/L (ref 5.7–26.3)
KAPPA/LAMBDA FLC RATIO: 1.75 — AB (ref 0.26–1.65)

## 2016-12-26 LAB — IGG, IGA, IGM
IgA, Qn, Serum: 502 mg/dL — ABNORMAL HIGH (ref 61–437)
IgG, Qn, Serum: 434 mg/dL — ABNORMAL LOW (ref 700–1600)
IgM, Qn, Serum: 7 mg/dL — ABNORMAL LOW (ref 15–143)

## 2016-12-26 NOTE — Telephone Encounter (Addendum)
Patient's wife is aware of results  ----- Message from [name], MD sent at 12/26/2016  1:56 PM EST ----- Call - IgA level is down by 50% already!!!  The Darzalex is working well!!  Stay off [employer]!!  Laurey Arrow

## 2017-01-01 ENCOUNTER — Ambulatory Visit: Payer: PPO

## 2017-01-01 ENCOUNTER — Other Ambulatory Visit: Payer: PPO

## 2017-01-01 ENCOUNTER — Other Ambulatory Visit: Payer: Self-pay

## 2017-01-01 DIAGNOSIS — C9000 Multiple myeloma not having achieved remission: Secondary | ICD-10-CM

## 2017-01-01 LAB — PROTEIN ELECTROPHORESIS, SERUM, WITH REFLEX
A/G Ratio: 1.2 (ref 0.7–1.7)
ALPHA 1: 0.2 g/dL (ref 0.0–0.4)
Albumin: 3.4 g/dL (ref 2.9–4.4)
Alpha 2: 1 g/dL (ref 0.4–1.0)
BETA: 1 g/dL (ref 0.7–1.3)
GAMMA GLOBULIN: 0.7 g/dL (ref 0.4–1.8)
GLOBULIN, TOTAL: 2.8 g/dL (ref 2.2–3.9)
Interpretation(See Below): 0
M-SPIKE, %: 0.2 g/dL — AB
Total Protein: 6.2 g/dL (ref 6.0–8.5)

## 2017-01-02 ENCOUNTER — Ambulatory Visit (HOSPITAL_BASED_OUTPATIENT_CLINIC_OR_DEPARTMENT_OTHER)
Admission: RE | Admit: 2017-01-02 | Discharge: 2017-01-02 | Disposition: A | Discharge status: Home / Self Care | Payer: PPO | Source: Ambulatory Visit | Attending: Hematology & Oncology | Admitting: Hematology & Oncology

## 2017-01-02 ENCOUNTER — Other Ambulatory Visit (HOSPITAL_BASED_OUTPATIENT_CLINIC_OR_DEPARTMENT_OTHER): Payer: PPO

## 2017-01-02 ENCOUNTER — Ambulatory Visit: Payer: PPO

## 2017-01-02 ENCOUNTER — Ambulatory Visit (HOSPITAL_BASED_OUTPATIENT_CLINIC_OR_DEPARTMENT_OTHER): Payer: PPO

## 2017-01-02 ENCOUNTER — Other Ambulatory Visit: Payer: Self-pay | Admitting: Family

## 2017-01-02 ENCOUNTER — Telehealth: Payer: Self-pay

## 2017-01-02 VITALS — BP 130/71 | HR 85 | Temp 98.5°F | Resp 18

## 2017-01-02 DIAGNOSIS — G8929 Other chronic pain: Secondary | ICD-10-CM

## 2017-01-02 DIAGNOSIS — M533 Sacrococcygeal disorders, not elsewhere classified: Secondary | ICD-10-CM | POA: Diagnosis not present

## 2017-01-02 DIAGNOSIS — Z5112 Encounter for antineoplastic immunotherapy: Secondary | ICD-10-CM

## 2017-01-02 DIAGNOSIS — C9000 Multiple myeloma not having achieved remission: Secondary | ICD-10-CM

## 2017-01-02 DIAGNOSIS — M545 Low back pain: Secondary | ICD-10-CM | POA: Diagnosis not present

## 2017-01-02 DIAGNOSIS — M5136 Other intervertebral disc degeneration, lumbar region: Secondary | ICD-10-CM | POA: Diagnosis not present

## 2017-01-02 LAB — CMP (CANCER CENTER ONLY)
ALT: 25 U/L (ref 10–47)
AST: 21 U/L (ref 11–38)
Albumin: 3.6 g/dL (ref 3.3–5.5)
Alkaline Phosphatase: 42 U/L (ref 26–84)
BILIRUBIN TOTAL: 0.7 mg/dL (ref 0.20–1.60)
BUN: 17 mg/dL (ref 7–22)
CALCIUM: 10.4 mg/dL — AB (ref 8.0–10.3)
CO2: 29 meq/L (ref 18–33)
CREATININE: 1 mg/dL (ref 0.6–1.2)
Chloride: 102 mEq/L (ref 98–108)
GLUCOSE: 96 mg/dL (ref 73–118)
Potassium: 3.9 mEq/L (ref 3.3–4.7)
SODIUM: 141 meq/L (ref 128–145)
Total Protein: 6.5 g/dL (ref 6.4–8.1)

## 2017-01-02 LAB — CBC WITH DIFFERENTIAL (CANCER CENTER ONLY)
BASO#: 0.1 10*3/uL (ref 0.0–0.2)
BASO%: 2.2 % — ABNORMAL HIGH (ref 0.0–2.0)
EOS ABS: 0 10*3/uL (ref 0.0–0.5)
EOS%: 1 % (ref 0.0–7.0)
HEMATOCRIT: 38.1 % — AB (ref 38.7–49.9)
HEMOGLOBIN: 12.6 g/dL — AB (ref 13.0–17.1)
LYMPH#: 0.8 10*3/uL — AB (ref 0.9–3.3)
LYMPH%: 18.8 % (ref 14.0–48.0)
MCH: 34.7 pg — AB (ref 28.0–33.4)
MCHC: 33.1 g/dL (ref 32.0–35.9)
MCV: 105 fL — ABNORMAL HIGH (ref 82–98)
MONO#: 0.5 10*3/uL (ref 0.1–0.9)
MONO%: 12.7 % (ref 0.0–13.0)
NEUT%: 65.3 % (ref 40.0–80.0)
NEUTROS ABS: 2.7 10*3/uL (ref 1.5–6.5)
Platelets: 189 10*3/uL (ref 145–400)
RBC: 3.63 10*6/uL — ABNORMAL LOW (ref 4.20–5.70)
RDW: 15.1 % (ref 11.1–15.7)
WBC: 4.1 10*3/uL (ref 4.0–10.0)

## 2017-01-02 MED ORDER — ACETAMINOPHEN 325 MG PO TABS
650.0000 mg | ORAL_TABLET | Freq: Once | ORAL | Status: AC
  Administered 2017-01-02: 650 mg via ORAL

## 2017-01-02 MED ORDER — SODIUM CHLORIDE 0.9% FLUSH
10.0000 mL | INTRAVENOUS | Status: DC | PRN
  Administered 2017-01-02: 10 mL
  Filled 2017-01-02: qty 10

## 2017-01-02 MED ORDER — DIPHENHYDRAMINE HCL 25 MG PO CAPS
50.0000 mg | ORAL_CAPSULE | Freq: Once | ORAL | Status: AC
  Administered 2017-01-02: 50 mg via ORAL

## 2017-01-02 MED ORDER — METHYLPREDNISOLONE SODIUM SUCC 125 MG IJ SOLR
125.0000 mg | Freq: Once | INTRAMUSCULAR | Status: AC
Start: 2017-01-02 — End: 2017-01-02
  Administered 2017-01-02: 125 mg via INTRAVENOUS

## 2017-01-02 MED ORDER — ACETAMINOPHEN 325 MG PO TABS
ORAL_TABLET | ORAL | Status: AC
  Filled 2017-01-02: qty 2

## 2017-01-02 MED ORDER — PROCHLORPERAZINE MALEATE 10 MG PO TABS
ORAL_TABLET | ORAL | Status: AC
  Filled 2017-01-02: qty 1

## 2017-01-02 MED ORDER — PROCHLORPERAZINE MALEATE 10 MG PO TABS
10.0000 mg | ORAL_TABLET | Freq: Once | ORAL | Status: AC
  Administered 2017-01-02: 10 mg via ORAL

## 2017-01-02 MED ORDER — DIPHENHYDRAMINE HCL 25 MG PO CAPS
ORAL_CAPSULE | ORAL | Status: AC
  Filled 2017-01-02: qty 2

## 2017-01-02 MED ORDER — SODIUM CHLORIDE 0.9 % IV SOLN
Freq: Once | INTRAVENOUS | Status: AC
  Administered 2017-01-02: 09:00:00 via INTRAVENOUS

## 2017-01-02 MED ORDER — HEPARIN SOD (PORK) LOCK FLUSH 100 UNIT/ML IV SOLN
500.0000 [IU] | Freq: Once | INTRAVENOUS | Status: AC | PRN
  Administered 2017-01-02: 500 [IU]
  Filled 2017-01-02: qty 5

## 2017-01-02 MED ORDER — KETOROLAC TROMETHAMINE 15 MG/ML IJ SOLN
INTRAMUSCULAR | Status: AC
  Filled 2017-01-02: qty 2

## 2017-01-02 MED ORDER — SODIUM CHLORIDE 0.9 % IV SOLN
15.7000 mg/kg | Freq: Once | INTRAVENOUS | Status: AC
  Administered 2017-01-02: 1600 mg via INTRAVENOUS
  Filled 2017-01-02: qty 80

## 2017-01-02 MED ORDER — METHYLPREDNISOLONE SODIUM SUCC 125 MG IJ SOLR
INTRAMUSCULAR | Status: AC
  Filled 2017-01-02: qty 2

## 2017-01-02 MED ORDER — KETOROLAC TROMETHAMINE 15 MG/ML IJ SOLN
30.0000 mg | Freq: Once | INTRAMUSCULAR | Status: AC
  Administered 2017-01-02: 30 mg via INTRAVENOUS
  Filled 2017-01-02: qty 2

## 2017-01-02 NOTE — Patient Instructions (Signed)
Implanted Port Insertion, Care After °This sheet gives you information about how to care for yourself after your procedure. Your health care provider may also give you more specific instructions. If you have problems or questions, contact your health care provider. °What can I expect after the procedure? °After your procedure, it is common to have: °· Discomfort at the port insertion site. °· Bruising on the skin over the port. This should improve over 3-4 days. ° °Follow these instructions at home: °Port care °· After your port is placed, you will get a manufacturer's information card. The card has information about your port. Keep this card with you at all times. °· Take care of the port as told by your health care provider. Ask your health care provider if you or a family member can get training for taking care of the port at home. A home health care nurse may also take care of the port. °· Make sure to remember what type of port you have. °Incision care °· Follow instructions from your health care provider about how to take care of your port insertion site. Make sure you: °? Wash your hands with soap and water before you change your bandage (dressing). If soap and water are not available, use hand sanitizer. °? Change your dressing as told by your health care provider. °? Leave stitches (sutures), skin glue, or adhesive strips in place. These skin closures may need to stay in place for 2 weeks or longer. If adhesive strip edges start to loosen and curl up, you may trim the loose edges. Do not remove adhesive strips completely unless your health care provider tells you to do that. °· Check your port insertion site every day for signs of infection. Check for: °? More redness, swelling, or pain. °? More fluid or blood. °? Warmth. °? Pus or a bad smell. °General instructions °· Do not take baths, swim, or use a hot tub until your health care provider approves. °· Do not lift anything that is heavier than 10 lb (4.5  kg) for a week, or as told by your health care provider. °· Ask your health care provider when it is okay to: °? Return to work or school. °? Resume usual physical activities or sports. °· Do not drive for 24 hours if you were given a medicine to help you relax (sedative). °· Take over-the-counter and prescription medicines only as told by your health care provider. °· Wear a medical alert bracelet in case of an emergency. This will tell any health care providers that you have a port. °· Keep all follow-up visits as told by your health care provider. This is important. °Contact a health care provider if: °· You cannot flush your port with saline as directed, or you cannot draw blood from the port. °· You have a fever or chills. °· You have more redness, swelling, or pain around your port insertion site. °· You have more fluid or blood coming from your port insertion site. °· Your port insertion site feels warm to the touch. °· You have pus or a bad smell coming from the port insertion site. °Get help right away if: °· You have chest pain or shortness of breath. °· You have bleeding from your port that you cannot control. °Summary °· Take care of the port as told by your health care provider. °· Change your dressing as told by your health care provider. °· Keep all follow-up visits as told by your health care provider. °  This information is not intended to replace advice given to you by your health care provider. Make sure you discuss any questions you have with your health care provider. °Document Released: 10/29/2012 Document Revised: 11/30/2015 Document Reviewed: 11/30/2015 °Elsevier Interactive Patient Education © 2017 Elsevier Inc. ° °

## 2017-01-02 NOTE — Telephone Encounter (Signed)
Pt is requesting refill on tramadol 50mg .   Last OV: 11/22/2016 Last Fill: 10/02/2016 #120 and 0RF UDS: No further per note on 11/22/2016  Please advise.

## 2017-01-02 NOTE — Progress Notes (Signed)
Dr Marin Olp here to see patient regarding his back pain today.  Sent downstairs to [facility] per Dr. Marin Olp.  Gave orders for Toradol IV. Patient Back pain much better after injection.

## 2017-01-02 NOTE — Patient Instructions (Signed)
Daratumumab injection What is this medicine? DARATUMUMAB (dar a toom ue mab) is a monoclonal antibody. It is used to treat multiple myeloma. This medicine may be used for other purposes; ask your health care provider or pharmacist if you have questions. COMMON BRAND NAME(S): DARZALEX What should I tell my health care provider before I take this medicine? They need to know if you have any of these conditions: -infection (especially a virus infection such as chickenpox, cold sores, or herpes) -lung or breathing disease -pregnant or trying to get pregnant -breast-feeding -an unusual or allergic reaction to daratumumab, other medicines, foods, dyes, or preservatives How should I use this medicine? This medicine is for infusion into a vein. It is given by a health care professional in a hospital or clinic setting. Talk to your pediatrician regarding the use of this medicine in children. Special care may be needed. Overdosage: If you think you have taken too much of this medicine contact a poison control center or emergency room at once. NOTE: This medicine is only for you. Do not share this medicine with others. What if I miss a dose? Keep appointments for follow-up doses as directed. It is important not to miss your dose. Call your doctor or health care professional if you are unable to keep an appointment. What may interact with this medicine? Interactions have not been studied. Give your health care provider a list of all the medicines, herbs, non-prescription drugs, or dietary supplements you use. Also tell them if you smoke, drink alcohol, or use illegal drugs. Some items may interact with your medicine. This list may not describe all possible interactions. Give your health care provider a list of all the medicines, herbs, non-prescription drugs, or dietary supplements you use. Also tell them if you smoke, drink alcohol, or use illegal drugs. Some items may interact with your medicine. What  should I watch for while using this medicine? This drug may make you feel generally unwell. Report any side effects. Continue your course of treatment even though you feel ill unless your doctor tells you to stop. This medicine can cause serious allergic reactions. To reduce your risk you may need to take medicine before treatment with this medicine. Take your medicine as directed. This medicine can affect the results of blood tests to match your blood type. These changes can last for up to 6 months after the final dose. Your healthcare provider will do blood tests to match your blood type before you start treatment. Tell all of your healthcare providers that you are being treated with this medicine before receiving a blood transfusion. This medicine can affect the results of some tests used to determine treatment response; extra tests may be needed to evaluate response. Do not become pregnant while taking this medicine or for 3 months after stopping it. Women should inform their doctor if they wish to become pregnant or think they might be pregnant. There is a potential for serious side effects to an unborn child. Talk to your health care professional or pharmacist for more information. What side effects may I notice from receiving this medicine? Side effects that you should report to your doctor or health care professional as soon as possible: -allergic reactions like skin rash, itching or hives, swelling of the face, lips, or tongue -breathing problems -chills -cough -dizziness -feeling faint or lightheaded -headache -low blood counts - this medicine may decrease the number of white blood cells, red blood cells and platelets. You may be at increased risk  for infections and bleeding. -nausea, vomiting -shortness of breath -signs of decreased platelets or bleeding - bruising, pinpoint red spots on the skin, black, tarry stools, blood in the urine -signs of decreased red blood cells - unusually  weak or tired, feeling faint or lightheaded, falls -signs of infection - fever or chills, cough, sore throat, pain or difficulty passing urine Side effects that usually do not require medical attention (report to your doctor or health care professional if they continue or are bothersome): -back pain -diarrhea -muscle cramps -pain, tingling, numbness in the hands or feet -swelling of the ankles, feet, hands -tiredness This list may not describe all possible side effects. Call your doctor for medical advice about side effects. You may report side effects to FDA at 1-800-FDA-1088. Where should I keep my medicine? Keep out of the reach of children. This drug is given in a hospital or clinic and will not be stored at home. NOTE: This sheet is a summary. It may not cover all possible information. If you have questions about this medicine, talk to your doctor, pharmacist, or health care provider.  2018 Elsevier/Gold Standard (2015-02-10 10:38:11)  

## 2017-01-03 MED ORDER — TRAMADOL HCL 50 MG PO TABS: 25.0000 mg | ORAL_TABLET | Freq: Two times a day (BID) | ORAL | 0 refills | Status: DC | PRN

## 2017-01-03 NOTE — Telephone Encounter (Signed)
sent 

## 2017-01-03 NOTE — Telephone Encounter (Signed)
Noted, thank you

## 2017-01-08 ENCOUNTER — Ambulatory Visit (HOSPITAL_BASED_OUTPATIENT_CLINIC_OR_DEPARTMENT_OTHER): Payer: PPO

## 2017-01-08 ENCOUNTER — Encounter: Payer: Self-pay | Admitting: Hematology & Oncology

## 2017-01-08 ENCOUNTER — Ambulatory Visit: Payer: PPO

## 2017-01-08 ENCOUNTER — Other Ambulatory Visit: Payer: Self-pay

## 2017-01-08 ENCOUNTER — Ambulatory Visit (HOSPITAL_BASED_OUTPATIENT_CLINIC_OR_DEPARTMENT_OTHER): Payer: PPO | Admitting: Hematology & Oncology

## 2017-01-08 ENCOUNTER — Other Ambulatory Visit (HOSPITAL_BASED_OUTPATIENT_CLINIC_OR_DEPARTMENT_OTHER): Payer: PPO

## 2017-01-08 VITALS — BP 130/69 | HR 79 | Temp 98.2°F | Resp 18

## 2017-01-08 VITALS — BP 158/85 | HR 75 | Temp 98.4°F | Resp 18 | Wt 217.8 lb

## 2017-01-08 DIAGNOSIS — C9000 Multiple myeloma not having achieved remission: Secondary | ICD-10-CM

## 2017-01-08 DIAGNOSIS — C9001 Multiple myeloma in remission: Secondary | ICD-10-CM

## 2017-01-08 DIAGNOSIS — Z9484 Stem cells transplant status: Secondary | ICD-10-CM | POA: Diagnosis not present

## 2017-01-08 DIAGNOSIS — Z5112 Encounter for antineoplastic immunotherapy: Secondary | ICD-10-CM

## 2017-01-08 LAB — CMP (CANCER CENTER ONLY)
ALBUMIN: 3.5 g/dL (ref 3.3–5.5)
ALT(SGPT): 20 U/L (ref 10–47)
AST: 16 U/L (ref 11–38)
Alkaline Phosphatase: 40 U/L (ref 26–84)
BILIRUBIN TOTAL: 0.5 mg/dL (ref 0.20–1.60)
BUN, Bld: 18 mg/dL (ref 7–22)
CALCIUM: 9.8 mg/dL (ref 8.0–10.3)
CHLORIDE: 102 meq/L (ref 98–108)
CO2: 31 meq/L (ref 18–33)
CREATININE: 0.9 mg/dL (ref 0.6–1.2)
Glucose, Bld: 88 mg/dL (ref 73–118)
Potassium: 3.8 mEq/L (ref 3.3–4.7)
SODIUM: 143 meq/L (ref 128–145)
TOTAL PROTEIN: 6.4 g/dL (ref 6.4–8.1)

## 2017-01-08 LAB — CBC WITH DIFFERENTIAL (CANCER CENTER ONLY)
BASO#: 0 10*3/uL (ref 0.0–0.2)
BASO%: 0.6 % (ref 0.0–2.0)
EOS ABS: 0.1 10*3/uL (ref 0.0–0.5)
EOS%: 1 % (ref 0.0–7.0)
HEMATOCRIT: 37.5 % — AB (ref 38.7–49.9)
HEMOGLOBIN: 12.4 g/dL — AB (ref 13.0–17.1)
LYMPH#: 1.4 10*3/uL (ref 0.9–3.3)
LYMPH%: 29.1 % (ref 14.0–48.0)
MCH: 34.5 pg — AB (ref 28.0–33.4)
MCHC: 33.1 g/dL (ref 32.0–35.9)
MCV: 105 fL — ABNORMAL HIGH (ref 82–98)
MONO#: 0.8 10*3/uL (ref 0.1–0.9)
MONO%: 15.9 % — ABNORMAL HIGH (ref 0.0–13.0)
NEUT%: 53.4 % (ref 40.0–80.0)
NEUTROS ABS: 2.5 10*3/uL (ref 1.5–6.5)
Platelets: 182 10*3/uL (ref 145–400)
RBC: 3.59 10*6/uL — AB (ref 4.20–5.70)
RDW: 14.5 % (ref 11.1–15.7)
WBC: 4.8 10*3/uL (ref 4.0–10.0)

## 2017-01-08 MED ORDER — HEPARIN SOD (PORK) LOCK FLUSH 100 UNIT/ML IV SOLN
500.0000 [IU] | Freq: Once | INTRAVENOUS | Status: AC | PRN
  Administered 2017-01-08: 500 [IU]
  Filled 2017-01-08: qty 5

## 2017-01-08 MED ORDER — PROCHLORPERAZINE MALEATE 10 MG PO TABS
ORAL_TABLET | ORAL | Status: AC
  Filled 2017-01-08: qty 1

## 2017-01-08 MED ORDER — PROCHLORPERAZINE MALEATE 10 MG PO TABS
10.0000 mg | ORAL_TABLET | Freq: Once | ORAL | Status: AC
  Administered 2017-01-08: 10 mg via ORAL

## 2017-01-08 MED ORDER — ACETAMINOPHEN 325 MG PO TABS
ORAL_TABLET | ORAL | Status: AC
  Filled 2017-01-08: qty 2

## 2017-01-08 MED ORDER — SODIUM CHLORIDE 0.9 % IJ SOLN
10.0000 mL | Freq: Once | INTRAMUSCULAR | Status: AC
  Administered 2017-01-08: 10 mL
  Filled 2017-01-08: qty 10

## 2017-01-08 MED ORDER — SODIUM CHLORIDE 0.9% FLUSH
10.0000 mL | INTRAVENOUS | Status: DC | PRN
  Administered 2017-01-08: 10 mL
  Filled 2017-01-08: qty 10

## 2017-01-08 MED ORDER — METHYLPREDNISOLONE SODIUM SUCC 125 MG IJ SOLR
125.0000 mg | Freq: Once | INTRAMUSCULAR | Status: AC
  Administered 2017-01-08: 125 mg via INTRAVENOUS

## 2017-01-08 MED ORDER — DIPHENHYDRAMINE HCL 25 MG PO CAPS
ORAL_CAPSULE | ORAL | Status: AC
  Filled 2017-01-08: qty 2

## 2017-01-08 MED ORDER — SODIUM CHLORIDE 0.9 % IV SOLN
15.7000 mg/kg | Freq: Once | INTRAVENOUS | Status: AC
  Administered 2017-01-08: 1600 mg via INTRAVENOUS
  Filled 2017-01-08: qty 80

## 2017-01-08 MED ORDER — METHYLPREDNISOLONE SODIUM SUCC 125 MG IJ SOLR
INTRAMUSCULAR | Status: AC
  Filled 2017-01-08: qty 2

## 2017-01-08 MED ORDER — DIPHENHYDRAMINE HCL 25 MG PO CAPS
50.0000 mg | ORAL_CAPSULE | Freq: Once | ORAL | Status: AC
  Administered 2017-01-08: 50 mg via ORAL

## 2017-01-08 MED ORDER — SODIUM CHLORIDE 0.9 % IV SOLN
Freq: Once | INTRAVENOUS | Status: AC
  Administered 2017-01-08: 09:00:00 via INTRAVENOUS

## 2017-01-08 MED ORDER — ACETAMINOPHEN 325 MG PO TABS
650.0000 mg | ORAL_TABLET | Freq: Once | ORAL | Status: AC
  Administered 2017-01-08: 650 mg via ORAL

## 2017-01-08 NOTE — Patient Instructions (Signed)
[facility] Discharge Instructions for Patients Receiving Chemotherapy  Today you received the following chemotherapy agents:  Darzalex  To help prevent nausea and vomiting after your treatment, we encourage you to take your nausea medication as prescribed.   If you develop nausea and vomiting that is not controlled by your nausea medication, call the clinic.   BELOW ARE SYMPTOMS THAT SHOULD BE REPORTED IMMEDIATELY:  *FEVER GREATER THAN 100.5 F  *CHILLS WITH OR WITHOUT FEVER  NAUSEA AND VOMITING THAT IS NOT CONTROLLED WITH YOUR NAUSEA MEDICATION  *UNUSUAL SHORTNESS OF BREATH  *UNUSUAL BRUISING OR BLEEDING  TENDERNESS IN MOUTH AND THROAT WITH OR WITHOUT PRESENCE OF ULCERS  *URINARY PROBLEMS  *BOWEL PROBLEMS  UNUSUAL RASH Items with * indicate a potential emergency and should be followed up as soon as possible.  Feel free to call the clinic should you have any questions or concerns. The clinic phone number is [phone].  Please show the CHEMO ALERT CARD at check-in to the Emergency Department and triage nurse.   

## 2017-01-08 NOTE — Progress Notes (Signed)
Hematology and Oncology Follow Up Visit  Gene Hill [id number] [birth year] 76 y.o. 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  He is doing well.  He has already begun to respond.  [name] went from 1.2 g/dL down to 02 g/dL.  His IgA level is now down to 502 mg/dL.  His Kappa Light chain went from 4.4 mg down to 1.1 mg/dL.  He is having some back pain.  We did do x-rays on him last time he was here.  There are no fractures.  This might be arthritis.  If the pain continues, we will get an MRI.    He now is off the Pomalidomide.  I think this is making him feel less fatigued.    His appetite is good.  He has had no nausea or vomiting.  Has had no rashes.  He said no leg swelling.    It is now been a year since he had surgery for his right knee.    Overall, his performance status is ECOG 1.  Medications:  Current Outpatient Medications:  .  aspirin 325 MG tablet, Take 325 mg by mouth daily., Disp: , Rfl:  .  Cholecalciferol (VITAMIN D3) 2000 units capsule, Take 2,000 Units by mouth daily. , Disp: , Rfl:  .  cyclobenzaprine (FLEXERIL) 10 MG tablet, Take 1 tablet (10 mg total) by mouth at bedtime as needed for muscle spasms., Disp: 90 tablet, Rfl: 0 .  dexamethasone (DECADRON) 2 MG tablet, Take 4mg  day after chemo then 2mg  daily x 4, Disp: 30 tablet, Rfl: 0 .  lidocaine (XYLOCAINE) 5 % ointment, Apply 1 application topically 2 (two) times daily as needed. Mix w/ OTC Hydrocortisone 1 %, Disp: 35.44 g, Rfl: 1 .  metoprolol tartrate (LOPRESSOR) 100 MG tablet, Take 0.5 tablets (50 mg total) by mouth 2 (two) times daily., Disp: 90 tablet, Rfl: 2 .  montelukast (SINGULAIR) 10 MG tablet,  TAKE 1 TABLET BY MOUTH EVERY DAY STARTING ON 11/29/16, Disp: 90 tablet, Rfl: 1 .  Multiple Vitamin (MULTIVITAMIN) tablet, Take 1 tablet by mouth daily., Disp: , Rfl:  .  Omega-3 Fatty Acids (FISH OIL) 1200 MG CAPS, Take 1,200 mg by mouth daily., Disp: , Rfl:  .  ondansetron (ZOFRAN) 4 MG tablet, Take 1 pill 1 hour before Ninlaro, then every 8 hrs prn nausea., Disp: 30 tablet, Rfl: 2 .  pantoprazole (PROTONIX) 40 MG tablet, Take 1 tablet (40 mg total) by mouth daily., Disp: 90 tablet, Rfl: 2 .  Probiotic Product (PROBIOTIC PO), Take 1 tablet by mouth daily. , Disp: , Rfl:  .  traMADol (ULTRAM) 50 MG tablet, Take 0.5-1 tablets (25-50 mg total) by mouth 2 (two) times daily as needed., Disp: 120 tablet, Rfl: 0 .  pomalidomide (POMALYST) 2 MG capsule, Take 1 capsule (2 mg total) by mouth daily. Take with water on days 1-21. Repeat every 28 days. [id number] (Patient not taking: Reported on 01/08/2017), Disp: 21 capsule, Rfl: 0 No current facility-administered medications for this  visit.   Facility-Administered Medications Ordered in Other Visits:  .  daratumumab (DARZALEX) 1,600 mg in sodium chloride 0.9 % 420 mL (3.2 mg/mL) chemo infusion, 15.7 mg/kg (Treatment Plan Recorded), Intravenous, Once, Gene Doo Hill, Hill .  heparin lock flush 100 unit/mL, 500 Units, Intracatheter, Once PRN, Gene Napoleon, Hill .  sodium chloride flush (NS) 0.9 % injection 10 mL, 10 mL, Intracatheter, PRN, Gene Napoleon, Hill, 10 mL at 12/18/16 1250 .  sodium chloride flush (NS) 0.9 % injection 10 mL, 10 mL, Intracatheter, PRN, Gene Hill  Allergies: No Known Allergies  Past Medical History, Surgical history, Social history, and Family History were reviewed and updated.  Review of Systems:as stated in the interim history  Phyical Exam:  weight is 217 lb 12 oz (98.8 kg). His oral temperature is 98.4 F (36.9 C). His blood pressure is 158/85 (abnormal) and his pulse is 75. His respiration is 18 and oxygen  saturation is 97%.    Physical Exam  Constitutional: He is oriented to person, place, and time.  HENT:  Head: Normocephalic and atraumatic.  Mouth/Throat: Oropharynx is clear and moist.  Eyes: EOM are normal. Pupils are equal, round, and reactive to light.  Neck: Normal range of motion.  Cardiovascular: Normal rate, regular rhythm and normal heart sounds.  Pulmonary/Chest: Effort normal and breath sounds normal.  Abdominal: Soft. Bowel sounds are normal.  Musculoskeletal: Normal range of motion. He exhibits no edema, tenderness or deformity.  Lymphadenopathy:    He has no cervical adenopathy.  Neurological: He is alert and oriented to person, place, and time.  Skin: Skin is warm and dry. No rash noted. No erythema.  Psychiatric: He has a normal mood and affect. His behavior is normal. Judgment and thought content normal.  Vitals reviewed.    Lab Results  Component Value Date   WBC 4.8 01/08/2017   HGB 12.4 (L) 01/08/2017   HCT 37.5 (L) 01/08/2017   MCV 105 (H) 01/08/2017   PLT 182 01/08/2017     Chemistry      Component Value Date/Time   NA 143 01/08/2017 0741   NA 139 06/14/2016 1246   K 3.8 01/08/2017 0741   K 3.8 06/14/2016 1246   CL 102 01/08/2017 0741   CO2 31 01/08/2017 0741   CO2 24 06/14/2016 1246   BUN 18 01/08/2017 0741   BUN 15.0 06/14/2016 1246   CREATININE 0.9 01/08/2017 0741   CREATININE 0.9 06/14/2016 1246      Component Value Date/Time   CALCIUM 9.8 01/08/2017 0741   CALCIUM 9.6 06/14/2016 1246   ALKPHOS 40 01/08/2017 0741   ALKPHOS 54 06/14/2016 1246   AST 16 01/08/2017 0741   AST 25 06/14/2016 1246   ALT 20 01/08/2017 0741   ALT 21 06/14/2016 1246   BILITOT 0.50 01/08/2017 0741   BILITOT 0.37 06/14/2016 1246       Impression and Plan: Mr. Tennis is a 76 year old gentleman with history of IgA kappa myeloma. 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 He was treated with standard therapy and got into remission. He then underwent  stem cell transplant back in November of 2011.  Again, his response has been very nice to date.  We will just go with single agent daratumumab right now.  I think this would be very reasonable.  I will plan to see him back in another 3 weeks.  Gene Napoleon, Hill 12/18/201810:36 AM

## 2017-01-08 NOTE — Patient Instructions (Signed)
Implanted Port Home Guide An implanted port is a type of central line that is placed under the skin. Central lines are used to provide IV access when treatment or nutrition needs to be given through a person's veins. Implanted ports are used for long-term IV access. An implanted port may be placed because:  You need IV medicine that would be irritating to the small veins in your hands or arms.  You need long-term IV medicines, such as antibiotics.  You need IV nutrition for a long period.  You need frequent blood draws for lab tests.  You need dialysis.  Implanted ports are usually placed in the chest area, but they can also be placed in the upper arm, the abdomen, or the leg. An implanted port has two main parts:  Reservoir. The reservoir is round and will appear as a small, raised area under your skin. The reservoir is the part where a needle is inserted to give medicines or draw blood.  Catheter. The catheter is a thin, flexible tube that extends from the reservoir. The catheter is placed into a large vein. Medicine that is inserted into the reservoir goes into the catheter and then into the vein.  How will I care for my incision site? Do not get the incision site wet. Bathe or shower as directed by your health care provider. How is my port accessed? Special steps must be taken to access the port:  Before the port is accessed, a numbing cream can be placed on the skin. This helps numb the skin over the port site.  Your health care provider uses a sterile technique to access the port. ? Your health care provider must put on a mask and sterile gloves. ? The skin over your port is cleaned carefully with an antiseptic and allowed to dry. ? The port is gently pinched between sterile gloves, and a needle is inserted into the port.  Only "non-coring" port needles should be used to access the port. Once the port is accessed, a blood return should be checked. This helps ensure that the port  is in the vein and is not clogged.  If your port needs to remain accessed for a constant infusion, a clear (transparent) bandage will be placed over the needle site. The bandage and needle will need to be changed every week, or as directed by your health care provider.  Keep the bandage covering the needle clean and dry. Do not get it wet. Follow your health care provider's instructions on how to take a shower or bath while the port is accessed.  If your port does not need to stay accessed, no bandage is needed over the port.  What is flushing? Flushing helps keep the port from getting clogged. Follow your health care provider's instructions on how and when to flush the port. Ports are usually flushed with saline solution or a medicine called heparin. The need for flushing will depend on how the port is used.  If the port is used for intermittent medicines or blood draws, the port will need to be flushed: ? After medicines have been given. ? After blood has been drawn. ? As part of routine maintenance.  If a constant infusion is running, the port may not need to be flushed.  How long will my port stay implanted? The port can stay in for as long as your health care provider thinks it is needed. When it is time for the port to come out, surgery will be   done to remove it. The procedure is similar to the one performed when the port was put in. When should I seek immediate medical care? When you have an implanted port, you should seek immediate medical care if:  You notice a bad smell coming from the incision site.  You have swelling, redness, or drainage at the incision site.  You have more swelling or pain at the port site or the surrounding area.  You have a fever that is not controlled with medicine.  This information is not intended to replace advice given to you by your health care provider. Make sure you discuss any questions you have with your health care provider. Document  Released: 01/08/2005 Document Revised: 06/16/2015 Document Reviewed: 09/15/2012 Elsevier Interactive Patient Education  2017 Elsevier Inc.  

## 2017-01-09 LAB — IGG, IGA, IGM
IGA/IMMUNOGLOBULIN A, SERUM: 386 mg/dL (ref 61–437)
IGM (IMMUNOGLOBIN M), SRM: 8 mg/dL — AB (ref 15–143)
IgG, Qn, Serum: 421 mg/dL — ABNORMAL LOW (ref 700–1600)

## 2017-01-09 LAB — KAPPA/LAMBDA LIGHT CHAINS
Ig Kappa Free Light Chain: 7.8 mg/L (ref 3.3–19.4)
Ig Lambda Free Light Chain: 2.3 mg/L — ABNORMAL LOW (ref 5.7–26.3)
Kappa/Lambda FluidC Ratio: 3.39 — ABNORMAL HIGH (ref 0.26–1.65)

## 2017-01-11 ENCOUNTER — Other Ambulatory Visit: Payer: Self-pay | Admitting: *Deleted

## 2017-01-11 DIAGNOSIS — C9000 Multiple myeloma not having achieved remission: Secondary | ICD-10-CM

## 2017-01-11 LAB — PROTEIN ELECTROPHORESIS, SERUM, WITH REFLEX
A/G RATIO SPE: 1.5 (ref 0.7–1.7)
ALPHA 1: 0.2 g/dL (ref 0.0–0.4)
Albumin: 3.8 g/dL (ref 2.9–4.4)
Alpha 2: 0.8 g/dL (ref 0.4–1.0)
Beta: 1 g/dL (ref 0.7–1.3)
GAMMA GLOBULIN: 0.6 g/dL (ref 0.4–1.8)
Globulin, Total: 2.6 g/dL (ref 2.2–3.9)
Interpretation(See Below): 0
M-SPIKE, %: 0.1 g/dL — AB
TOTAL PROTEIN: 6.4 g/dL (ref 6.0–8.5)

## 2017-01-14 ENCOUNTER — Other Ambulatory Visit: Payer: Self-pay

## 2017-01-14 ENCOUNTER — Ambulatory Visit: Payer: PPO

## 2017-01-14 ENCOUNTER — Other Ambulatory Visit (HOSPITAL_BASED_OUTPATIENT_CLINIC_OR_DEPARTMENT_OTHER): Payer: PPO

## 2017-01-14 ENCOUNTER — Ambulatory Visit (HOSPITAL_BASED_OUTPATIENT_CLINIC_OR_DEPARTMENT_OTHER): Payer: PPO

## 2017-01-14 VITALS — BP 114/59 | HR 82 | Temp 98.3°F | Resp 20

## 2017-01-14 DIAGNOSIS — C9000 Multiple myeloma not having achieved remission: Secondary | ICD-10-CM | POA: Diagnosis not present

## 2017-01-14 DIAGNOSIS — Z5112 Encounter for antineoplastic immunotherapy: Secondary | ICD-10-CM | POA: Diagnosis not present

## 2017-01-14 LAB — CMP (CANCER CENTER ONLY)
ALK PHOS: 36 U/L (ref 26–84)
ALT: 27 U/L (ref 10–47)
AST: 17 U/L (ref 11–38)
Albumin: 3.4 g/dL (ref 3.3–5.5)
BUN: 24 mg/dL — AB (ref 7–22)
CO2: 29 mEq/L (ref 18–33)
CREATININE: 0.8 mg/dL (ref 0.6–1.2)
Calcium: 9.3 mg/dL (ref 8.0–10.3)
Chloride: 102 mEq/L (ref 98–108)
GLUCOSE: 86 mg/dL (ref 73–118)
POTASSIUM: 3.7 meq/L (ref 3.3–4.7)
SODIUM: 141 meq/L (ref 128–145)
TOTAL PROTEIN: 6.2 g/dL — AB (ref 6.4–8.1)
Total Bilirubin: 0.6 mg/dl (ref 0.20–1.60)

## 2017-01-14 LAB — CBC WITH DIFFERENTIAL (CANCER CENTER ONLY)
BASO#: 0 10*3/uL (ref 0.0–0.2)
BASO%: 0.2 % (ref 0.0–2.0)
EOS%: 1.7 % (ref 0.0–7.0)
Eosinophils Absolute: 0.1 10*3/uL (ref 0.0–0.5)
HCT: 37.4 % — ABNORMAL LOW (ref 38.7–49.9)
HGB: 12.4 g/dL — ABNORMAL LOW (ref 13.0–17.1)
LYMPH#: 1.3 10*3/uL (ref 0.9–3.3)
LYMPH%: 30.6 % (ref 14.0–48.0)
MCH: 34.5 pg — ABNORMAL HIGH (ref 28.0–33.4)
MCHC: 33.2 g/dL (ref 32.0–35.9)
MCV: 104 fL — AB (ref 82–98)
MONO#: 0.7 10*3/uL (ref 0.1–0.9)
MONO%: 16.9 % — AB (ref 0.0–13.0)
NEUT#: 2.1 10*3/uL (ref 1.5–6.5)
NEUT%: 50.6 % (ref 40.0–80.0)
PLATELETS: 167 10*3/uL (ref 145–400)
RBC: 3.59 10*6/uL — AB (ref 4.20–5.70)
RDW: 14.9 % (ref 11.1–15.7)
WBC: 4.2 10*3/uL (ref 4.0–10.0)

## 2017-01-14 MED ORDER — HEPARIN SOD (PORK) LOCK FLUSH 100 UNIT/ML IV SOLN
500.0000 [IU] | Freq: Once | INTRAVENOUS | Status: AC | PRN
  Administered 2017-01-14: 500 [IU]
  Filled 2017-01-14: qty 5

## 2017-01-14 MED ORDER — SODIUM CHLORIDE 0.9 % IV SOLN
Freq: Once | INTRAVENOUS | Status: AC
  Administered 2017-01-14: 09:00:00 via INTRAVENOUS

## 2017-01-14 MED ORDER — PROCHLORPERAZINE MALEATE 10 MG PO TABS
ORAL_TABLET | ORAL | Status: AC
  Filled 2017-01-14: qty 1

## 2017-01-14 MED ORDER — METHYLPREDNISOLONE SODIUM SUCC 125 MG IJ SOLR
125.0000 mg | Freq: Once | INTRAMUSCULAR | Status: AC
Start: 2017-01-14 — End: 2017-01-14
  Administered 2017-01-14: 125 mg via INTRAVENOUS

## 2017-01-14 MED ORDER — SODIUM CHLORIDE 0.9 % IV SOLN
15.7000 mg/kg | Freq: Once | INTRAVENOUS | Status: AC
  Administered 2017-01-14: 1600 mg via INTRAVENOUS
  Filled 2017-01-14: qty 80

## 2017-01-14 MED ORDER — METHYLPREDNISOLONE SODIUM SUCC 125 MG IJ SOLR
INTRAMUSCULAR | Status: AC
  Filled 2017-01-14: qty 2

## 2017-01-14 MED ORDER — DIPHENHYDRAMINE HCL 25 MG PO CAPS
ORAL_CAPSULE | ORAL | Status: AC
  Filled 2017-01-14: qty 2

## 2017-01-14 MED ORDER — DEXAMETHASONE 2 MG PO TABS: ORAL_TABLET | ORAL | 0 refills | Status: DC

## 2017-01-14 MED ORDER — DIPHENHYDRAMINE HCL 25 MG PO CAPS
50.0000 mg | ORAL_CAPSULE | Freq: Once | ORAL | Status: AC
  Administered 2017-01-14: 50 mg via ORAL

## 2017-01-14 MED ORDER — PROCHLORPERAZINE MALEATE 10 MG PO TABS
10.0000 mg | ORAL_TABLET | Freq: Once | ORAL | Status: AC
  Administered 2017-01-14: 10 mg via ORAL

## 2017-01-14 MED ORDER — ACETAMINOPHEN 325 MG PO TABS
ORAL_TABLET | ORAL | Status: AC
  Filled 2017-01-14: qty 2

## 2017-01-14 MED ORDER — ACETAMINOPHEN 325 MG PO TABS
650.0000 mg | ORAL_TABLET | Freq: Once | ORAL | Status: AC
Start: 2017-01-14 — End: 2017-01-14
  Administered 2017-01-14: 650 mg via ORAL

## 2017-01-14 MED ORDER — SODIUM CHLORIDE 0.9% FLUSH
10.0000 mL | INTRAVENOUS | Status: DC | PRN
  Administered 2017-01-14: 10 mL
  Filled 2017-01-14: qty 10

## 2017-01-14 NOTE — Patient Instructions (Signed)
Daratumumab injection What is this medicine? DARATUMUMAB (dar a toom ue mab) is a monoclonal antibody. It is used to treat multiple myeloma. This medicine may be used for other purposes; ask your health care provider or pharmacist if you have questions. COMMON BRAND NAME(S): DARZALEX What should I tell my health care provider before I take this medicine? They need to know if you have any of these conditions: -infection (especially a virus infection such as chickenpox, cold sores, or herpes) -lung or breathing disease -pregnant or trying to get pregnant -breast-feeding -an unusual or allergic reaction to daratumumab, other medicines, foods, dyes, or preservatives How should I use this medicine? This medicine is for infusion into a vein. It is given by a health care professional in a hospital or clinic setting. Talk to your pediatrician regarding the use of this medicine in children. Special care may be needed. Overdosage: If you think you have taken too much of this medicine contact a poison control center or emergency room at once. NOTE: This medicine is only for you. Do not share this medicine with others. What if I miss a dose? Keep appointments for follow-up doses as directed. It is important not to miss your dose. Call your doctor or health care professional if you are unable to keep an appointment. What may interact with this medicine? Interactions have not been studied. Give your health care provider a list of all the medicines, herbs, non-prescription drugs, or dietary supplements you use. Also tell them if you smoke, drink alcohol, or use illegal drugs. Some items may interact with your medicine. This list may not describe all possible interactions. Give your health care provider a list of all the medicines, herbs, non-prescription drugs, or dietary supplements you use. Also tell them if you smoke, drink alcohol, or use illegal drugs. Some items may interact with your medicine. What  should I watch for while using this medicine? This drug may make you feel generally unwell. Report any side effects. Continue your course of treatment even though you feel ill unless your doctor tells you to stop. This medicine can cause serious allergic reactions. To reduce your risk you may need to take medicine before treatment with this medicine. Take your medicine as directed. This medicine can affect the results of blood tests to match your blood type. These changes can last for up to 6 months after the final dose. Your healthcare provider will do blood tests to match your blood type before you start treatment. Tell all of your healthcare providers that you are being treated with this medicine before receiving a blood transfusion. This medicine can affect the results of some tests used to determine treatment response; extra tests may be needed to evaluate response. Do not become pregnant while taking this medicine or for 3 months after stopping it. Women should inform their doctor if they wish to become pregnant or think they might be pregnant. There is a potential for serious side effects to an unborn child. Talk to your health care professional or pharmacist for more information. What side effects may I notice from receiving this medicine? Side effects that you should report to your doctor or health care professional as soon as possible: -allergic reactions like skin rash, itching or hives, swelling of the face, lips, or tongue -breathing problems -chills -cough -dizziness -feeling faint or lightheaded -headache -low blood counts - this medicine may decrease the number of white blood cells, red blood cells and platelets. You may be at increased risk  for infections and bleeding. -nausea, vomiting -shortness of breath -signs of decreased platelets or bleeding - bruising, pinpoint red spots on the skin, black, tarry stools, blood in the urine -signs of decreased red blood cells - unusually  weak or tired, feeling faint or lightheaded, falls -signs of infection - fever or chills, cough, sore throat, pain or difficulty passing urine Side effects that usually do not require medical attention (report to your doctor or health care professional if they continue or are bothersome): -back pain -diarrhea -muscle cramps -pain, tingling, numbness in the hands or feet -swelling of the ankles, feet, hands -tiredness This list may not describe all possible side effects. Call your doctor for medical advice about side effects. You may report side effects to FDA at 1-800-FDA-1088. Where should I keep my medicine? Keep out of the reach of children. This drug is given in a hospital or clinic and will not be stored at home. NOTE: This sheet is a summary. It may not cover all possible information. If you have questions about this medicine, talk to your doctor, pharmacist, or health care provider.  2018 Elsevier/Gold Standard (2015-02-10 10:38:11)  

## 2017-01-14 NOTE — Patient Instructions (Signed)
Implanted Port Home Guide An implanted port is a type of central line that is placed under the skin. Central lines are used to provide IV access when treatment or nutrition needs to be given through a person's veins. Implanted ports are used for long-term IV access. An implanted port may be placed because:  You need IV medicine that would be irritating to the small veins in your hands or arms.  You need long-term IV medicines, such as antibiotics.  You need IV nutrition for a long period.  You need frequent blood draws for lab tests.  You need dialysis.  Implanted ports are usually placed in the chest area, but they can also be placed in the upper arm, the abdomen, or the leg. An implanted port has two main parts:  Reservoir. The reservoir is round and will appear as a small, raised area under your skin. The reservoir is the part where a needle is inserted to give medicines or draw blood.  Catheter. The catheter is a thin, flexible tube that extends from the reservoir. The catheter is placed into a large vein. Medicine that is inserted into the reservoir goes into the catheter and then into the vein.  How will I care for my incision site? Do not get the incision site wet. Bathe or shower as directed by your health care provider. How is my port accessed? Special steps must be taken to access the port:  Before the port is accessed, a numbing cream can be placed on the skin. This helps numb the skin over the port site.  Your health care provider uses a sterile technique to access the port. ? Your health care provider must put on a mask and sterile gloves. ? The skin over your port is cleaned carefully with an antiseptic and allowed to dry. ? The port is gently pinched between sterile gloves, and a needle is inserted into the port.  Only "non-coring" port needles should be used to access the port. Once the port is accessed, a blood return should be checked. This helps ensure that the port  is in the vein and is not clogged.  If your port needs to remain accessed for a constant infusion, a clear (transparent) bandage will be placed over the needle site. The bandage and needle will need to be changed every week, or as directed by your health care provider.  Keep the bandage covering the needle clean and dry. Do not get it wet. Follow your health care provider's instructions on how to take a shower or bath while the port is accessed.  If your port does not need to stay accessed, no bandage is needed over the port.  What is flushing? Flushing helps keep the port from getting clogged. Follow your health care provider's instructions on how and when to flush the port. Ports are usually flushed with saline solution or a medicine called heparin. The need for flushing will depend on how the port is used.  If the port is used for intermittent medicines or blood draws, the port will need to be flushed: ? After medicines have been given. ? After blood has been drawn. ? As part of routine maintenance.  If a constant infusion is running, the port may not need to be flushed.  How long will my port stay implanted? The port can stay in for as long as your health care provider thinks it is needed. When it is time for the port to come out, surgery will be   done to remove it. The procedure is similar to the one performed when the port was put in. When should I seek immediate medical care? When you have an implanted port, you should seek immediate medical care if:  You notice a bad smell coming from the incision site.  You have swelling, redness, or drainage at the incision site.  You have more swelling or pain at the port site or the surrounding area.  You have a fever that is not controlled with medicine.  This information is not intended to replace advice given to you by your health care provider. Make sure you discuss any questions you have with your health care provider. Document  Released: 01/08/2005 Document Revised: 06/16/2015 Document Reviewed: 09/15/2012 Elsevier Interactive Patient Education  2017 Elsevier Inc.  

## 2017-01-14 NOTE — Addendum Note (Signed)
Addended by: Burney Gauze R on: 01/14/2017 09:27 AM   Modules accepted: Orders

## 2017-01-18 ENCOUNTER — Other Ambulatory Visit: Payer: Self-pay | Admitting: *Deleted

## 2017-01-18 DIAGNOSIS — C9000 Multiple myeloma not having achieved remission: Secondary | ICD-10-CM

## 2017-01-20 ENCOUNTER — Other Ambulatory Visit: Payer: Self-pay | Admitting: Hematology & Oncology

## 2017-01-20 DIAGNOSIS — C9000 Multiple myeloma not having achieved remission: Secondary | ICD-10-CM

## 2017-01-21 ENCOUNTER — Ambulatory Visit (HOSPITAL_BASED_OUTPATIENT_CLINIC_OR_DEPARTMENT_OTHER): Payer: PPO

## 2017-01-21 ENCOUNTER — Other Ambulatory Visit (HOSPITAL_BASED_OUTPATIENT_CLINIC_OR_DEPARTMENT_OTHER): Payer: PPO

## 2017-01-21 ENCOUNTER — Ambulatory Visit: Payer: PPO

## 2017-01-21 ENCOUNTER — Ambulatory Visit (HOSPITAL_BASED_OUTPATIENT_CLINIC_OR_DEPARTMENT_OTHER): Payer: PPO | Admitting: Hematology & Oncology

## 2017-01-21 ENCOUNTER — Other Ambulatory Visit: Payer: Self-pay

## 2017-01-21 VITALS — BP 123/71 | HR 82 | Temp 98.1°F | Resp 20

## 2017-01-21 DIAGNOSIS — C9002 Multiple myeloma in relapse: Secondary | ICD-10-CM | POA: Diagnosis not present

## 2017-01-21 DIAGNOSIS — C9000 Multiple myeloma not having achieved remission: Secondary | ICD-10-CM

## 2017-01-21 DIAGNOSIS — Z5112 Encounter for antineoplastic immunotherapy: Secondary | ICD-10-CM | POA: Diagnosis not present

## 2017-01-21 DIAGNOSIS — M25512 Pain in left shoulder: Secondary | ICD-10-CM | POA: Diagnosis not present

## 2017-01-21 LAB — CBC WITH DIFFERENTIAL (CANCER CENTER ONLY)
BASO#: 0 10*3/uL (ref 0.0–0.2)
BASO%: 0.2 % (ref 0.0–2.0)
EOS%: 0.7 % (ref 0.0–7.0)
Eosinophils Absolute: 0 10*3/uL (ref 0.0–0.5)
HCT: 37.8 % — ABNORMAL LOW (ref 38.7–49.9)
HGB: 12.4 g/dL — ABNORMAL LOW (ref 13.0–17.1)
LYMPH#: 1 10*3/uL (ref 0.9–3.3)
LYMPH%: 22.7 % (ref 14.0–48.0)
MCH: 34.7 pg — ABNORMAL HIGH (ref 28.0–33.4)
MCHC: 32.8 g/dL (ref 32.0–35.9)
MCV: 106 fL — AB (ref 82–98)
MONO#: 0.6 10*3/uL (ref 0.1–0.9)
MONO%: 12.7 % (ref 0.0–13.0)
NEUT#: 2.8 10*3/uL (ref 1.5–6.5)
NEUT%: 63.7 % (ref 40.0–80.0)
PLATELETS: 143 10*3/uL — AB (ref 145–400)
RBC: 3.57 10*6/uL — ABNORMAL LOW (ref 4.20–5.70)
RDW: 15.2 % (ref 11.1–15.7)
WBC: 4.4 10*3/uL (ref 4.0–10.0)

## 2017-01-21 LAB — CMP (CANCER CENTER ONLY)
ALK PHOS: 37 U/L (ref 26–84)
ALT: 29 U/L (ref 10–47)
AST: 20 U/L (ref 11–38)
Albumin: 3.3 g/dL (ref 3.3–5.5)
BUN: 20 mg/dL (ref 7–22)
CHLORIDE: 103 meq/L (ref 98–108)
CO2: 30 mEq/L (ref 18–33)
CREATININE: 1 mg/dL (ref 0.6–1.2)
Calcium: 9 mg/dL (ref 8.0–10.3)
GLUCOSE: 90 mg/dL (ref 73–118)
POTASSIUM: 4 meq/L (ref 3.3–4.7)
SODIUM: 141 meq/L (ref 128–145)
Total Bilirubin: 0.6 mg/dl (ref 0.20–1.60)
Total Protein: 6 g/dL — ABNORMAL LOW (ref 6.4–8.1)

## 2017-01-21 MED ORDER — METHYLPREDNISOLONE SODIUM SUCC 125 MG IJ SOLR
125.0000 mg | Freq: Once | INTRAMUSCULAR | Status: AC
  Administered 2017-01-21: 125 mg via INTRAVENOUS

## 2017-01-21 MED ORDER — ACETAMINOPHEN 325 MG PO TABS
ORAL_TABLET | ORAL | Status: AC
  Filled 2017-01-21: qty 2

## 2017-01-21 MED ORDER — HEPARIN SOD (PORK) LOCK FLUSH 100 UNIT/ML IV SOLN
500.0000 [IU] | Freq: Once | INTRAVENOUS | Status: AC | PRN
  Administered 2017-01-21: 500 [IU]
  Filled 2017-01-21: qty 5

## 2017-01-21 MED ORDER — DIPHENHYDRAMINE HCL 25 MG PO CAPS
ORAL_CAPSULE | ORAL | Status: AC
  Filled 2017-01-21: qty 2

## 2017-01-21 MED ORDER — PROCHLORPERAZINE MALEATE 10 MG PO TABS
ORAL_TABLET | ORAL | Status: AC
  Filled 2017-01-21: qty 1

## 2017-01-21 MED ORDER — SODIUM CHLORIDE 0.9 % IV SOLN
15.6000 mg/kg | Freq: Once | INTRAVENOUS | Status: AC
  Administered 2017-01-21: 1600 mg via INTRAVENOUS
  Filled 2017-01-21: qty 80

## 2017-01-21 MED ORDER — DIPHENHYDRAMINE HCL 25 MG PO CAPS
50.0000 mg | ORAL_CAPSULE | Freq: Once | ORAL | Status: AC
  Administered 2017-01-21: 50 mg via ORAL

## 2017-01-21 MED ORDER — SODIUM CHLORIDE 0.9% FLUSH
10.0000 mL | INTRAVENOUS | Status: DC | PRN
  Administered 2017-01-21: 10 mL
  Filled 2017-01-21: qty 10

## 2017-01-21 MED ORDER — METHYLPREDNISOLONE SODIUM SUCC 125 MG IJ SOLR
INTRAMUSCULAR | Status: AC
  Filled 2017-01-21: qty 2

## 2017-01-21 MED ORDER — ACETAMINOPHEN 325 MG PO TABS
650.0000 mg | ORAL_TABLET | Freq: Once | ORAL | Status: AC
  Administered 2017-01-21: 650 mg via ORAL

## 2017-01-21 MED ORDER — PROCHLORPERAZINE MALEATE 10 MG PO TABS
10.0000 mg | ORAL_TABLET | Freq: Once | ORAL | Status: AC
  Administered 2017-01-21: 10 mg via ORAL

## 2017-01-21 MED ORDER — SODIUM CHLORIDE 0.9 % IV SOLN
Freq: Once | INTRAVENOUS | Status: AC
  Administered 2017-01-21: 09:00:00 via INTRAVENOUS

## 2017-01-21 NOTE — Patient Instructions (Signed)
[facility] Discharge Instructions for Patients Receiving Chemotherapy  Today you received the following chemotherapy agents:  Darzalex  To help prevent nausea and vomiting after your treatment, we encourage you to take your nausea medication as ordered per MD.    If you develop nausea and vomiting that is not controlled by your nausea medication, call the clinic.   BELOW ARE SYMPTOMS THAT SHOULD BE REPORTED IMMEDIATELY:  *FEVER GREATER THAN 100.5 F  *CHILLS WITH OR WITHOUT FEVER  NAUSEA AND VOMITING THAT IS NOT CONTROLLED WITH YOUR NAUSEA MEDICATION  *UNUSUAL SHORTNESS OF BREATH  *UNUSUAL BRUISING OR BLEEDING  TENDERNESS IN MOUTH AND THROAT WITH OR WITHOUT PRESENCE OF ULCERS  *URINARY PROBLEMS  *BOWEL PROBLEMS  UNUSUAL RASH Items with * indicate a potential emergency and should be followed up as soon as possible.  Feel free to call the clinic should you have any questions or concerns. The clinic phone number is [phone].  Please show the CHEMO ALERT CARD at check-in to the Emergency Department and triage nurse.   

## 2017-01-21 NOTE — Progress Notes (Signed)
Hematology and Oncology Follow Up Visit  Gene Hill [id number] [birth year] 76 y.o. 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  He has responded incredibly well to the Darzalex.  [name] a week or so ago was 0.1 g/dL.  His IgA level was down to 400mg /dL.  His Kappa Lightchain was 8 mg/dL.   Problem that he has now is that he has a hard time moving his left shoulder.  This happened about a week ago.  He denies any trauma.  He did not fall.  He has had rotator cuff problems with his right shoulder.  He has had surgery for this.  We will have to get an MRI to see what is going on.  He does have an orthopedic surgeon that we probably will have to get him back to.  He has had no issues with fever.  He has had no cough.  He has had no rashes.  Says he is hungry all the time.  He has had some nausea.  We had him on some Zofran.  Overall, his performance status is ECOG 1.  Medications:  Current Outpatient Medications:  .  aspirin 325 MG tablet, Take 325 mg by mouth daily., Disp: , Rfl:  .  Cholecalciferol (VITAMIN D3) 2000 units capsule, Take 2,000 Units by mouth daily. , Disp: , Rfl:  .  cyclobenzaprine (FLEXERIL) 10 MG tablet, Take 1 tablet (10 mg total) by mouth at bedtime as needed for muscle spasms., Disp: 90 tablet, Rfl: 0 .  dexamethasone (DECADRON) 2 MG tablet, Take 4mg  day after chemo then 2mg  daily x 4, Disp: 30 tablet, Rfl: 0 .  lidocaine (XYLOCAINE) 5 % ointment, Apply 1 application topically 2 (two) times daily as needed. Mix w/ OTC Hydrocortisone 1 %, Disp: 35.44 g, Rfl: 1 .  metoprolol tartrate (LOPRESSOR) 100 MG tablet, Take 0.5 tablets (50 mg total) by mouth 2  (two) times daily., Disp: 90 tablet, Rfl: 2 .  montelukast (SINGULAIR) 10 MG tablet, TAKE 1 TABLET BY MOUTH EVERY DAY STARTING ON 11/29/16, Disp: 90 tablet, Rfl: 1 .  Multiple Vitamin (MULTIVITAMIN) tablet, Take 1 tablet by mouth daily., Disp: , Rfl:  .  Omega-3 Fatty Acids (FISH OIL) 1200 MG CAPS, Take 1,200 mg by mouth daily., Disp: , Rfl:  .  ondansetron (ZOFRAN) 4 MG tablet, TAKE 1 TABLET BY MOUTH 1 HOUR BEFORE NINLARO, THEN EVERY 8 HOURS AS NEEDED FOR NAUSEA, Disp: 30 tablet, Rfl: 0 .  pantoprazole (PROTONIX) 40 MG tablet, Take 1 tablet (40 mg total) by mouth daily., Disp: 90 tablet, Rfl: 2 .  Probiotic Product (PROBIOTIC PO), Take 1 tablet by mouth daily. , Disp: , Rfl:  .  traMADol (ULTRAM) 50 MG tablet, Take 0.5-1 tablets (25-50 mg total) by mouth 2 (two) times daily as needed., Disp: 120 tablet, Rfl: 0 .  pomalidomide (POMALYST) 2 MG capsule, Take 1 capsule (2 mg total) by mouth daily. Take with water on days 1-21. Repeat every 28 days. [id number] (  Patient not taking: Reported on 01/08/2017), Disp: 21 capsule, Rfl: 0 No current facility-administered medications for this visit.   Facility-Administered Medications Ordered in Other Visits:  .  daratumumab (DARZALEX) 1,600 mg in sodium chloride 0.9 % 420 mL (3.2 mg/mL) chemo infusion, 15.7 mg/kg (Treatment Plan Recorded), Intravenous, Once, Faye Sanfilippo R, MD .  sodium chloride flush (NS) 0.9 % injection 10 mL, 10 mL, Intracatheter, PRN, [name], MD, 10 mL at 12/18/16 1250  Allergies: No Known Allergies  Past Medical History, Surgical history, Social history, and Family History were reviewed and updated.  Review of Systems:as stated in the interim history  Phyical Exam:  weight is 224 lb 8 oz (101.8 kg). His oral temperature is 98.3 F (36.8 C). His blood pressure is 121/66 and his pulse is 81. His respiration is 20 and oxygen saturation is 95%.    Physical Exam  Constitutional: He is oriented to person, place, and time.   HENT:  Head: Normocephalic and atraumatic.  Mouth/Throat: Oropharynx is clear and moist.  Eyes: EOM are normal. Pupils are equal, round, and reactive to light.  Neck: Normal range of motion.  Cardiovascular: Normal rate, regular rhythm and normal heart sounds.  Pulmonary/Chest: Effort normal and breath sounds normal.  Abdominal: Soft. Bowel sounds are normal.  Musculoskeletal: Normal range of motion. He exhibits no edema, tenderness or deformity.  He has limited range of motion of the left shoulder.  There is some tenderness to palpation over the anterior portion of the left shoulder.  Lymphadenopathy:    He has no cervical adenopathy.  Neurological: He is alert and oriented to person, place, and time.  Skin: Skin is warm and dry. No rash noted. No erythema.  Psychiatric: He has a normal mood and affect. His behavior is normal. Judgment and thought content normal.  Vitals reviewed.    Lab Results  Component Value Date   WBC 4.4 01/21/2017   HGB 12.4 (L) 01/21/2017   HCT 37.8 (L) 01/21/2017   MCV 106 (H) 01/21/2017   PLT 143 (L) 01/21/2017     Chemistry      Component Value Date/Time   NA 141 01/21/2017 0741   NA 139 06/14/2016 1246   K 4.0 01/21/2017 0741   K 3.8 06/14/2016 1246   CL 103 01/21/2017 0741   CO2 30 01/21/2017 0741   CO2 24 06/14/2016 1246   BUN 20 01/21/2017 0741   BUN 15.0 06/14/2016 1246   CREATININE 1.0 01/21/2017 0741   CREATININE 0.9 06/14/2016 1246      Component Value Date/Time   CALCIUM 9.0 01/21/2017 0741   CALCIUM 9.6 06/14/2016 1246   ALKPHOS 37 01/21/2017 0741   ALKPHOS 54 06/14/2016 1246   AST 20 01/21/2017 0741   AST 25 06/14/2016 1246   ALT 29 01/21/2017 0741   ALT 21 06/14/2016 1246   BILITOT 0.60 01/21/2017 0741   BILITOT 0.37 06/14/2016 1246       Impression and Plan: Gene Hill is a 76 year old gentleman with history of IgA kappa myeloma. 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 He was treated with standard  therapy and got into remission. 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  We will now move his Darzalex to every 2-week dosing.  I think we go to every 2 weeks because he is done so well.  We will plan to get the MRI hopefully this week.  He needs an open MRI.  We will get him back  in 2 weeks.  He will continue off the Pomalidomide.      [name], MD 12/31/20188:35 AM

## 2017-01-22 DIAGNOSIS — M75100 Unspecified rotator cuff tear or rupture of unspecified shoulder, not specified as traumatic: Secondary | ICD-10-CM

## 2017-01-22 HISTORY — DX: Unspecified rotator cuff tear or rupture of unspecified shoulder, not specified as traumatic: M75.100

## 2017-01-28 ENCOUNTER — Other Ambulatory Visit: Payer: PPO

## 2017-01-28 ENCOUNTER — Telehealth: Payer: Self-pay | Admitting: *Deleted

## 2017-01-28 ENCOUNTER — Other Ambulatory Visit: Payer: Self-pay | Admitting: Hematology & Oncology

## 2017-01-28 ENCOUNTER — Ambulatory Visit
Admission: RE | Admit: 2017-01-28 | Discharge: 2017-01-28 | Disposition: A | Discharge status: Home / Self Care | Payer: PPO | Source: Ambulatory Visit | Attending: Hematology & Oncology | Admitting: Hematology & Oncology

## 2017-01-28 ENCOUNTER — Other Ambulatory Visit: Payer: Self-pay | Admitting: Family

## 2017-01-28 ENCOUNTER — Ambulatory Visit: Payer: PPO

## 2017-01-28 DIAGNOSIS — M25512 Pain in left shoulder: Secondary | ICD-10-CM

## 2017-01-28 DIAGNOSIS — C9000 Multiple myeloma not having achieved remission: Secondary | ICD-10-CM

## 2017-01-28 DIAGNOSIS — S46012A Strain of muscle(s) and tendon(s) of the rotator cuff of left shoulder, initial encounter: Secondary | ICD-10-CM | POA: Diagnosis not present

## 2017-01-28 DIAGNOSIS — M545 Low back pain, unspecified: Secondary | ICD-10-CM

## 2017-01-28 MED ORDER — GADOBENATE DIMEGLUMINE 529 MG/ML IV SOLN
20.0000 mL | Freq: Once | INTRAVENOUS | Status: AC | PRN
  Administered 2017-01-28: 20 mL via INTRAVENOUS

## 2017-01-28 NOTE — Progress Notes (Signed)
Mri lumbar

## 2017-01-28 NOTE — Telephone Encounter (Signed)
Patient c/o severe lower back pain. It's affecting his ability to move his lower extremities. He feels like his legs are "locked in place". He has an MRI of his shoulder ordered and scheduled for this evening and he'd like to have the back done at the same time.  Reviewed symptoms with Dr Marin Olp. An order for an MRI will be placed and an attempt made to schedule tonight with other MRI.

## 2017-01-29 ENCOUNTER — Encounter: Payer: Self-pay | Admitting: Oncology

## 2017-01-29 ENCOUNTER — Encounter: Payer: Self-pay | Admitting: Hematology & Oncology

## 2017-01-29 NOTE — Progress Notes (Signed)
Patient's spouse Bartolo Darter called and left a message requesting a call back or they would cancel upcoming appointments.  Called spouse back and introduced myself as one of the Arboricult[employer]ist at [employer]. Listened to her concerns. She advised that they have received a letter stating that no claims have been submitted for PAF copay relief foundation and some claims would need to be submitted to keep the grant open. Asked her whom applied for the grant. She states she did it online and was told we would need to enter the information in on the portal. Advised her what our process is here with copay assistance and how it is handled. She was very frustrated and states she has been getting the run around for quite sometime and she was tired of it. Advised her I apologize for the inconvenience this has caused and I would work to get it resolved. I explained that I would first reach out to Tara at [facility] to find out her process and obtain the information needed to send to billing, send to billing, and call her with a follow up. She asked if she needed to contact PAF. I advised her that she did not need to. She states ok and that she would give it to tomorrow afternoon before they cancel all appointments. I gave her my name and confirmed I would work on this and contact her back.  Called Tara at [facility] to ask what her process was for copay assistance and the reason for my question . She told me that she enrolls them and then sends the information to Rockie Neighbours and Freda Munro and that she has no billing contact person to send information to. She said the paperwork should be scanned in the media tab. I thanked her and advised her I would work on from my end and send to billing.  I was able to locate the approval letter and POE form under the media tab as Baxter Flattery mentioned. I printed and emailed a copy along with the screenshot of charges in self-pay to [place] in billing and asked if they could work on  today. I received a response back from [place] that she was working on. She emailed me back and state it was sent. She then emailed me back and asked if it should be sent to [facility]. Once I read the email, I advised her that it needed to be sent to PAF per spouse request to keep grant open. She emailed me back and state she already sent to [facility] and would work on correcting tomorrow and send to [employer].  I called the spouse back to advise what had been done. She was very appreciative and understanding. I advised her that I would contact her tomorrow once billing has sent to the correct foundation. She verbalized understanding. She had other questions regarding deductible and OOP. I answered them for her. She has my name and number for any other questions regarding this matter.

## 2017-01-30 ENCOUNTER — Encounter: Payer: Self-pay | Admitting: Hematology & Oncology

## 2017-01-30 NOTE — Progress Notes (Signed)
Received email from [place] in billing that claims have now been sent to PAF.  Called patient's spouse to advise.

## 2017-01-31 DIAGNOSIS — M25512 Pain in left shoulder: Secondary | ICD-10-CM | POA: Diagnosis not present

## 2017-02-01 ENCOUNTER — Telehealth: Payer: Self-pay | Admitting: *Deleted

## 2017-02-01 NOTE — Telephone Encounter (Signed)
Patient's wife would like to provide following update:  Patient is scheduled for rotator cuff repair surgery on 02/06/2017. Dr French Ana has requested that the patient's scheduled MRI's of his back be delayed until after surgery.  Information was given to Dr Marin Olp.

## 2017-02-03 ENCOUNTER — Other Ambulatory Visit: Payer: PPO

## 2017-02-04 ENCOUNTER — Inpatient Hospital Stay: Payer: PPO | Attending: Hematology & Oncology | Admitting: Hematology & Oncology

## 2017-02-04 ENCOUNTER — Telehealth: Payer: Self-pay | Admitting: *Deleted

## 2017-02-04 ENCOUNTER — Encounter: Payer: Self-pay | Admitting: Hematology & Oncology

## 2017-02-04 ENCOUNTER — Inpatient Hospital Stay: Payer: PPO

## 2017-02-04 ENCOUNTER — Other Ambulatory Visit: Payer: Self-pay

## 2017-02-04 VITALS — BP 141/71 | HR 88 | Temp 98.3°F | Resp 18

## 2017-02-04 VITALS — BP 119/78 | HR 97 | Temp 99.0°F | Resp 18 | Wt 222.2 lb

## 2017-02-04 DIAGNOSIS — Z9484 Stem cells transplant status: Secondary | ICD-10-CM | POA: Insufficient documentation

## 2017-02-04 DIAGNOSIS — C9000 Multiple myeloma not having achieved remission: Secondary | ICD-10-CM

## 2017-02-04 DIAGNOSIS — M25512 Pain in left shoulder: Secondary | ICD-10-CM

## 2017-02-04 DIAGNOSIS — Z7982 Long term (current) use of aspirin: Secondary | ICD-10-CM | POA: Insufficient documentation

## 2017-02-04 DIAGNOSIS — Z5111 Encounter for antineoplastic chemotherapy: Secondary | ICD-10-CM | POA: Diagnosis not present

## 2017-02-04 DIAGNOSIS — Z79899 Other long term (current) drug therapy: Secondary | ICD-10-CM | POA: Diagnosis not present

## 2017-02-04 LAB — CMP (CANCER CENTER ONLY)
ALT: 32 U/L (ref 0–55)
ANION GAP: 15 (ref 5–15)
AST: 20 U/L (ref 5–34)
Albumin: 3.4 g/dL — ABNORMAL LOW (ref 3.5–5.0)
Alkaline Phosphatase: 48 U/L (ref 26–84)
BUN: 10 mg/dL (ref 7–22)
CALCIUM: 9.2 mg/dL (ref 8.0–10.3)
CHLORIDE: 105 mmol/L (ref 98–108)
CO2: 27 mmol/L (ref 18–33)
Creatinine: 0.8 mg/dL (ref 0.70–1.30)
GLUCOSE: 103 mg/dL (ref 70–118)
Potassium: 3.7 mmol/L (ref 3.3–4.7)
Sodium: 147 mmol/L — ABNORMAL HIGH (ref 128–145)
Total Bilirubin: 0.6 mg/dL (ref 0.2–1.2)
Total Protein: 6.4 g/dL (ref 6.4–8.1)

## 2017-02-04 LAB — CBC WITH DIFFERENTIAL (CANCER CENTER ONLY)
BASOS PCT: 0 %
Basophils Absolute: 0 10*3/uL (ref 0.0–0.1)
Eosinophils Absolute: 0 10*3/uL (ref 0.0–0.5)
Eosinophils Relative: 1 %
HEMATOCRIT: 36.7 % — AB (ref 38.7–49.9)
Hemoglobin: 12.1 g/dL — ABNORMAL LOW (ref 13.0–17.1)
LYMPHS ABS: 0.9 10*3/uL (ref 0.9–3.3)
LYMPHS PCT: 24 %
MCH: 34.6 pg — AB (ref 28.0–33.4)
MCHC: 33 g/dL (ref 32.0–35.9)
MCV: 104.9 fL — AB (ref 82.0–98.0)
MONO ABS: 0.5 10*3/uL (ref 0.1–0.9)
MONOS PCT: 13 %
Neutro Abs: 2.3 10*3/uL (ref 1.5–6.5)
Neutrophils Relative %: 62 %
Platelet Count: 119 10*3/uL — ABNORMAL LOW (ref 140–400)
RBC: 3.5 MIL/uL — ABNORMAL LOW (ref 4.20–5.70)
RDW: 14.5 % (ref 11.1–15.7)
WBC Count: 3.7 10*3/uL — ABNORMAL LOW (ref 4.0–10.3)

## 2017-02-04 MED ORDER — DIPHENHYDRAMINE HCL 25 MG PO CAPS
50.0000 mg | ORAL_CAPSULE | Freq: Once | ORAL | Status: AC
  Administered 2017-02-04: 50 mg via ORAL

## 2017-02-04 MED ORDER — SODIUM CHLORIDE 0.9 % IJ SOLN
10.0000 mL | Freq: Once | INTRAMUSCULAR | Status: DC
  Filled 2017-02-04: qty 10

## 2017-02-04 MED ORDER — METHYLPREDNISOLONE SODIUM SUCC 125 MG IJ SOLR
125.0000 mg | Freq: Once | INTRAMUSCULAR | Status: AC
  Administered 2017-02-04: 125 mg via INTRAVENOUS

## 2017-02-04 MED ORDER — SODIUM CHLORIDE 0.9% FLUSH
10.0000 mL | INTRAVENOUS | Status: DC | PRN
  Administered 2017-02-04: 10 mL
  Filled 2017-02-04: qty 10

## 2017-02-04 MED ORDER — PROCHLORPERAZINE MALEATE 10 MG PO TABS
10.0000 mg | ORAL_TABLET | Freq: Once | ORAL | Status: AC
  Administered 2017-02-04: 10 mg via ORAL

## 2017-02-04 MED ORDER — PROCHLORPERAZINE MALEATE 10 MG PO TABS
ORAL_TABLET | ORAL | Status: AC
  Filled 2017-02-04: qty 1

## 2017-02-04 MED ORDER — DARATUMUMAB CHEMO INJECTION 400 MG/20ML
1600.0000 mg | Freq: Once | INTRAVENOUS | Status: AC
  Administered 2017-02-04: 1600 mg via INTRAVENOUS
  Filled 2017-02-04: qty 80

## 2017-02-04 MED ORDER — SODIUM CHLORIDE 0.9 % IV SOLN
Freq: Once | INTRAVENOUS | Status: AC
  Administered 2017-02-04: 10:00:00 via INTRAVENOUS

## 2017-02-04 MED ORDER — ACETAMINOPHEN 325 MG PO TABS
650.0000 mg | ORAL_TABLET | Freq: Once | ORAL | Status: AC
  Administered 2017-02-04: 650 mg via ORAL

## 2017-02-04 MED ORDER — DIPHENHYDRAMINE HCL 25 MG PO CAPS
ORAL_CAPSULE | ORAL | Status: AC
  Filled 2017-02-04: qty 2

## 2017-02-04 MED ORDER — ACETAMINOPHEN 325 MG PO TABS
ORAL_TABLET | ORAL | Status: AC
  Filled 2017-02-04: qty 2

## 2017-02-04 MED ORDER — METHYLPREDNISOLONE SODIUM SUCC 125 MG IJ SOLR
INTRAMUSCULAR | Status: AC
  Filled 2017-02-04: qty 2

## 2017-02-04 MED ORDER — HEPARIN SOD (PORK) LOCK FLUSH 100 UNIT/ML IV SOLN
500.0000 [IU] | Freq: Once | INTRAVENOUS | Status: AC | PRN
  Administered 2017-02-04: 500 [IU]
  Filled 2017-02-04: qty 5

## 2017-02-04 NOTE — Patient Instructions (Signed)
[facility] Discharge Instructions for Patients Receiving Chemotherapy  Today you received the following chemotherapy agents:  Darzalex  To help prevent nausea and vomiting after your treatment, we encourage you to take your nausea medication as prescribed.   If you develop nausea and vomiting that is not controlled by your nausea medication, call the clinic.   BELOW ARE SYMPTOMS THAT SHOULD BE REPORTED IMMEDIATELY:  *FEVER GREATER THAN 100.5 F  *CHILLS WITH OR WITHOUT FEVER  NAUSEA AND VOMITING THAT IS NOT CONTROLLED WITH YOUR NAUSEA MEDICATION  *UNUSUAL SHORTNESS OF BREATH  *UNUSUAL BRUISING OR BLEEDING  TENDERNESS IN MOUTH AND THROAT WITH OR WITHOUT PRESENCE OF ULCERS  *URINARY PROBLEMS  *BOWEL PROBLEMS  UNUSUAL RASH Items with * indicate a potential emergency and should be followed up as soon as possible.  Feel free to call the clinic should you have any questions or concerns. The clinic phone number is [phone].  Please show the CHEMO ALERT CARD at check-in to the Emergency Department and triage nurse.   

## 2017-02-04 NOTE — Telephone Encounter (Signed)
Received Medical/Surgical Clearance Form from [facility] requesting Surgical Clearancxe for Left shoulder scope wit Rotator Cuff repair; pt's wife requested De. Ennever's office hold on ordered Imaging until after Sx, pt is ongoing chemo Tx for Multiple Myeloma, forwarded to provider/SLS 01/14
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
## 2017-02-04 NOTE — Patient Instructions (Signed)
Implanted Port Home Guide An implanted port is a type of central line that is placed under the skin. Central lines are used to provide IV access when treatment or nutrition needs to be given through a person's veins. Implanted ports are used for long-term IV access. An implanted port may be placed because:  You need IV medicine that would be irritating to the small veins in your hands or arms.  You need long-term IV medicines, such as antibiotics.  You need IV nutrition for a long period.  You need frequent blood draws for lab tests.  You need dialysis.  Implanted ports are usually placed in the chest area, but they can also be placed in the upper arm, the abdomen, or the leg. An implanted port has two main parts:  Reservoir. The reservoir is round and will appear as a small, raised area under your skin. The reservoir is the part where a needle is inserted to give medicines or draw blood.  Catheter. The catheter is a thin, flexible tube that extends from the reservoir. The catheter is placed into a large vein. Medicine that is inserted into the reservoir goes into the catheter and then into the vein.  How will I care for my incision site? Do not get the incision site wet. Bathe or shower as directed by your health care provider. How is my port accessed? Special steps must be taken to access the port:  Before the port is accessed, a numbing cream can be placed on the skin. This helps numb the skin over the port site.  Your health care provider uses a sterile technique to access the port. ? Your health care provider must put on a mask and sterile gloves. ? The skin over your port is cleaned carefully with an antiseptic and allowed to dry. ? The port is gently pinched between sterile gloves, and a needle is inserted into the port.  Only "non-coring" port needles should be used to access the port. Once the port is accessed, a blood return should be checked. This helps ensure that the port  is in the vein and is not clogged.  If your port needs to remain accessed for a constant infusion, a clear (transparent) bandage will be placed over the needle site. The bandage and needle will need to be changed every week, or as directed by your health care provider.  Keep the bandage covering the needle clean and dry. Do not get it wet. Follow your health care provider's instructions on how to take a shower or bath while the port is accessed.  If your port does not need to stay accessed, no bandage is needed over the port.  What is flushing? Flushing helps keep the port from getting clogged. Follow your health care provider's instructions on how and when to flush the port. Ports are usually flushed with saline solution or a medicine called heparin. The need for flushing will depend on how the port is used.  If the port is used for intermittent medicines or blood draws, the port will need to be flushed: ? After medicines have been given. ? After blood has been drawn. ? As part of routine maintenance.  If a constant infusion is running, the port may not need to be flushed.  How long will my port stay implanted? The port can stay in for as long as your health care provider thinks it is needed. When it is time for the port to come out, surgery will be   done to remove it. The procedure is similar to the one performed when the port was put in. When should I seek immediate medical care? When you have an implanted port, you should seek immediate medical care if:  You notice a bad smell coming from the incision site.  You have swelling, redness, or drainage at the incision site.  You have more swelling or pain at the port site or the surrounding area.  You have a fever that is not controlled with medicine.  This information is not intended to replace advice given to you by your health care provider. Make sure you discuss any questions you have with your health care provider. Document  Released: 01/08/2005 Document Revised: 06/16/2015 Document Reviewed: 09/15/2012 Elsevier Interactive Patient Education  2017 Elsevier Inc.  

## 2017-02-04 NOTE — Telephone Encounter (Signed)
Needs surgical clearance for L shoulder scope. Saw  cardiology for preop clearance a couple of years ago: had a normal stress test 10-2015 and a essentially normal echo 11-2015. To my knowledge he has been cleared by hematology for this surgery Please call the patient, if he is not having chest pain or difficulty breathing he is cleared for surgery.

## 2017-02-05 LAB — KAPPA/LAMBDA LIGHT CHAINS
KAPPA, LAMDA LIGHT CHAIN RATIO: 3.48 — AB (ref 0.26–1.65)
Kappa free light chain: 10.8 mg/L (ref 3.3–19.4)
LAMDA FREE LIGHT CHAINS: 3.1 mg/L — AB (ref 5.7–26.3)

## 2017-02-05 NOTE — Telephone Encounter (Signed)
Spoke w/ Bartolo Darter, Pt's wife, informed that Pt is not having any CP or SOB. Will fax signed surgical clearance to [facility].

## 2017-02-05 NOTE — Telephone Encounter (Signed)
Form faxed to ATTN: Claiborne Billings at [facility]- [phone]. Form sent for scanning.

## 2017-02-05 NOTE — Telephone Encounter (Signed)
Received fax confirmation

## 2017-02-06 DIAGNOSIS — M948X1 Other specified disorders of cartilage, shoulder: Secondary | ICD-10-CM | POA: Diagnosis not present

## 2017-02-06 DIAGNOSIS — G8918 Other acute postprocedural pain: Secondary | ICD-10-CM | POA: Diagnosis not present

## 2017-02-06 DIAGNOSIS — M19012 Primary osteoarthritis, left shoulder: Secondary | ICD-10-CM | POA: Diagnosis not present

## 2017-02-06 DIAGNOSIS — M75122 Complete rotator cuff tear or rupture of left shoulder, not specified as traumatic: Secondary | ICD-10-CM | POA: Diagnosis not present

## 2017-02-06 DIAGNOSIS — M24112 Other articular cartilage disorders, left shoulder: Secondary | ICD-10-CM | POA: Diagnosis not present

## 2017-02-07 ENCOUNTER — Other Ambulatory Visit: Payer: PPO

## 2017-02-11 ENCOUNTER — Ambulatory Visit: Payer: PPO

## 2017-02-11 ENCOUNTER — Ambulatory Visit: Payer: PPO | Admitting: Hematology & Oncology

## 2017-02-11 ENCOUNTER — Other Ambulatory Visit: Payer: PPO

## 2017-02-12 DIAGNOSIS — M19012 Primary osteoarthritis, left shoulder: Secondary | ICD-10-CM | POA: Diagnosis not present

## 2017-02-12 LAB — IMMUNOFIXATION REFLEX, SERUM
IgA: 326 mg/dL (ref 61–437)
IgG (Immunoglobin G), Serum: 336 mg/dL — ABNORMAL LOW (ref 700–1600)
IgM (Immunoglobulin M), Srm: 6 mg/dL — ABNORMAL LOW (ref 15–143)

## 2017-02-18 ENCOUNTER — Ambulatory Visit: Payer: PPO

## 2017-02-18 ENCOUNTER — Other Ambulatory Visit: Payer: PPO

## 2017-02-18 ENCOUNTER — Ambulatory Visit: Payer: PPO | Admitting: Hematology & Oncology

## 2017-03-04 ENCOUNTER — Inpatient Hospital Stay: Payer: PPO | Attending: Hematology & Oncology | Admitting: Hematology & Oncology

## 2017-03-04 ENCOUNTER — Inpatient Hospital Stay: Payer: PPO

## 2017-03-04 ENCOUNTER — Encounter: Payer: Self-pay | Admitting: Hematology & Oncology

## 2017-03-04 ENCOUNTER — Other Ambulatory Visit: Payer: Self-pay

## 2017-03-04 VITALS — BP 134/76 | HR 89 | Temp 98.0°F | Resp 18

## 2017-03-04 DIAGNOSIS — C9000 Multiple myeloma not having achieved remission: Secondary | ICD-10-CM

## 2017-03-04 DIAGNOSIS — Z79899 Other long term (current) drug therapy: Secondary | ICD-10-CM

## 2017-03-04 DIAGNOSIS — Z9484 Stem cells transplant status: Secondary | ICD-10-CM | POA: Diagnosis not present

## 2017-03-04 DIAGNOSIS — Z5111 Encounter for antineoplastic chemotherapy: Secondary | ICD-10-CM | POA: Diagnosis present

## 2017-03-04 LAB — CMP (CANCER CENTER ONLY)
ALK PHOS: 55 U/L (ref 26–84)
ALT: 21 U/L (ref 0–55)
ANION GAP: 10 (ref 5–15)
AST: 19 U/L (ref 5–34)
Albumin: 3.6 g/dL (ref 3.5–5.0)
BILIRUBIN TOTAL: 0.6 mg/dL (ref 0.2–1.2)
BUN: 11 mg/dL (ref 7–22)
CALCIUM: 9.5 mg/dL (ref 8.0–10.3)
CO2: 28 mmol/L (ref 18–33)
Chloride: 107 mmol/L (ref 98–108)
Creatinine: 1.2 mg/dL (ref 0.70–1.30)
GLUCOSE: 105 mg/dL (ref 73–118)
POTASSIUM: 3.4 mmol/L (ref 3.3–4.7)
Sodium: 145 mmol/L (ref 128–145)
TOTAL PROTEIN: 7 g/dL (ref 6.4–8.1)

## 2017-03-04 LAB — CBC WITH DIFFERENTIAL (CANCER CENTER ONLY)
BASOS ABS: 0 10*3/uL (ref 0.0–0.1)
Basophils Relative: 0 %
EOS PCT: 1 %
Eosinophils Absolute: 0.1 10*3/uL (ref 0.0–0.5)
HEMATOCRIT: 36.5 % — AB (ref 38.7–49.9)
Hemoglobin: 11.7 g/dL — ABNORMAL LOW (ref 13.0–17.1)
LYMPHS ABS: 1.9 10*3/uL (ref 0.9–3.3)
LYMPHS PCT: 40 %
MCH: 33.2 pg (ref 28.0–33.4)
MCHC: 32.1 g/dL (ref 32.0–35.9)
MCV: 103.7 fL — AB (ref 82.0–98.0)
MONO ABS: 0.7 10*3/uL (ref 0.1–0.9)
Monocytes Relative: 14 %
NEUTROS ABS: 2.2 10*3/uL (ref 1.5–6.5)
Neutrophils Relative %: 45 %
PLATELETS: 222 10*3/uL (ref 145–400)
RBC: 3.52 MIL/uL — AB (ref 4.20–5.70)
RDW: 15.1 % (ref 11.1–15.7)
WBC: 4.8 10*3/uL (ref 4.0–10.0)

## 2017-03-04 MED ORDER — AMPICILLIN-SULBACTAM SODIUM 3 (2-1) G IJ SOLR
3.0000 g | Freq: Once | INTRAMUSCULAR | Status: AC
  Administered 2017-03-04: 3 g via INTRAVENOUS
  Filled 2017-03-04: qty 3

## 2017-03-04 MED ORDER — ACETAMINOPHEN 325 MG PO TABS
650.0000 mg | ORAL_TABLET | Freq: Once | ORAL | Status: AC
  Administered 2017-03-04: 650 mg via ORAL

## 2017-03-04 MED ORDER — PROCHLORPERAZINE MALEATE 10 MG PO TABS
ORAL_TABLET | ORAL | Status: AC
  Filled 2017-03-04: qty 1

## 2017-03-04 MED ORDER — HEPARIN SOD (PORK) LOCK FLUSH 100 UNIT/ML IV SOLN
500.0000 [IU] | Freq: Once | INTRAVENOUS | Status: AC | PRN
  Administered 2017-03-04: 500 [IU]
  Filled 2017-03-04: qty 5

## 2017-03-04 MED ORDER — METHYLPREDNISOLONE SODIUM SUCC 125 MG IJ SOLR
INTRAMUSCULAR | Status: AC
  Filled 2017-03-04: qty 2

## 2017-03-04 MED ORDER — SODIUM CHLORIDE 0.9 % IV SOLN
Freq: Once | INTRAVENOUS | Status: DC
  Filled 2017-03-04: qty 3

## 2017-03-04 MED ORDER — ACETAMINOPHEN 325 MG PO TABS
ORAL_TABLET | ORAL | Status: AC
  Filled 2017-03-04: qty 2

## 2017-03-04 MED ORDER — METHYLPREDNISOLONE SODIUM SUCC 125 MG IJ SOLR
125.0000 mg | Freq: Once | INTRAMUSCULAR | Status: AC
  Administered 2017-03-04: 125 mg via INTRAVENOUS

## 2017-03-04 MED ORDER — DIPHENHYDRAMINE HCL 25 MG PO CAPS
50.0000 mg | ORAL_CAPSULE | Freq: Once | ORAL | Status: AC
  Administered 2017-03-04: 50 mg via ORAL

## 2017-03-04 MED ORDER — DIPHENHYDRAMINE HCL 25 MG PO CAPS
ORAL_CAPSULE | ORAL | Status: AC
  Filled 2017-03-04: qty 2

## 2017-03-04 MED ORDER — PROCHLORPERAZINE MALEATE 10 MG PO TABS
10.0000 mg | ORAL_TABLET | Freq: Once | ORAL | Status: AC
  Administered 2017-03-04: 10 mg via ORAL

## 2017-03-04 MED ORDER — SODIUM CHLORIDE 0.9 % IV SOLN
Freq: Once | INTRAVENOUS | Status: AC
  Administered 2017-03-04: 11:00:00 via INTRAVENOUS

## 2017-03-04 MED ORDER — SODIUM CHLORIDE 0.9% FLUSH
10.0000 mL | INTRAVENOUS | Status: DC | PRN
  Administered 2017-03-04: 10 mL
  Filled 2017-03-04: qty 10

## 2017-03-04 MED ORDER — SODIUM CHLORIDE 0.9 % IV SOLN
1600.0000 mg | Freq: Once | INTRAVENOUS | Status: AC
  Administered 2017-03-04: 1600 mg via INTRAVENOUS
  Filled 2017-03-04: qty 80

## 2017-03-04 MED ORDER — AMOXICILLIN-POT CLAVULANATE 875-125 MG PO TABS: 1.0000 | ORAL_TABLET | Freq: Two times a day (BID) | ORAL | 2 refills | Status: DC

## 2017-03-04 NOTE — Patient Instructions (Signed)
[facility] Discharge Instructions for Patients Receiving Chemotherapy  Today you received the following chemotherapy agents Darzalex.  To help prevent nausea and vomiting after your treatment, we encourage you to take your nausea medication    If you develop nausea and vomiting that is not controlled by your nausea medication, call the clinic.   BELOW ARE SYMPTOMS THAT SHOULD BE REPORTED IMMEDIATELY:  *FEVER GREATER THAN 100.5 F  *CHILLS WITH OR WITHOUT FEVER  NAUSEA AND VOMITING THAT IS NOT CONTROLLED WITH YOUR NAUSEA MEDICATION  *UNUSUAL SHORTNESS OF BREATH  *UNUSUAL BRUISING OR BLEEDING  TENDERNESS IN MOUTH AND THROAT WITH OR WITHOUT PRESENCE OF ULCERS  *URINARY PROBLEMS  *BOWEL PROBLEMS  UNUSUAL RASH Items with * indicate a potential emergency and should be followed up as soon as possible.  Feel free to call the clinic should you have any questions or concerns. The clinic phone number is [phone].  Please show the CHEMO ALERT CARD at check-in to the Emergency Department and triage nurse.   

## 2017-03-04 NOTE — Patient Instructions (Signed)

## 2017-03-04 NOTE — Progress Notes (Signed)
Hematology and Oncology Follow Up Visit  TIMONTHY HOVATER [id number] 1940/04/27 77 y.o. 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  Kushnir is back for followup.  He had his left shoulder surgery a month ago.  He still has the sling on.  He is doing some therapy at home.  He cannot sleep all that well.  I told him to stop the Singulair.  I do not think he needs Singulair at this point.  This was strictly for reactions to Daratumumab.  He is already had 9 cycles of daratumumab so I do not think that he will have a reaction.  He has responded incredibly well.  A month ago, [name] was 0.1 g/dL.  His IgG level was 362 mg/dL.  His Kappa Lightchain was 1.1 mg/dL.  He goes back to see the orthopedic surgeon tomorrow.  His right shoulder is bothering him.  Allow this because he can only use his right hand and right arm.  He is not complaining of any back issues.  I think he did have MRI of his back which showed some arthritic issues not related to myeloma.  I guess it is Overall, his performance status is ECOG 1.  Medications:  Current Outpatient Medications:  .  aspirin 325 MG tablet, Take 325 mg by mouth daily., Disp: , Rfl:  .  cyclobenzaprine (FLEXERIL) 10 MG tablet, Take 1 tablet (10 mg total) by mouth at bedtime as needed for muscle spasms., Disp: 90 tablet, Rfl: 0 .  dexamethasone (DECADRON) 2 MG tablet, Take 4mg  day after chemo then 2mg  daily x 4, Disp: 30 tablet, Rfl: 0 .  lidocaine (XYLOCAINE) 5 % ointment, Apply 1 application topically 2 (two) times daily as needed. Mix w/ OTC Hydrocortisone 1 %, Disp: 35.44 g, Rfl: 1 .  metoprolol tartrate (LOPRESSOR) 100 MG tablet, Take 0.5 tablets (50 mg total) by mouth 2 (two) times  daily., Disp: 90 tablet, Rfl: 2 .  montelukast (SINGULAIR) 10 MG tablet, TAKE 1 TABLET BY MOUTH EVERY DAY STARTING ON 11/29/16, Disp: 90 tablet, Rfl: 1 .  Multiple Vitamin (MULTIVITAMIN) tablet, Take 1 tablet by mouth daily., Disp: , Rfl:  .  Omega-3 Fatty Acids (FISH OIL) 1200 MG CAPS, Take 1,200 mg by mouth daily., Disp: , Rfl:  .  oxyCODONE-acetaminophen (PERCOCET/ROXICET) 5-325 MG tablet, TK 1 TO 2 TS PO Q 4 TO 6 H PRN P, Disp: , Rfl: 0 .  pantoprazole (PROTONIX) 40 MG tablet, Take 1 tablet (40 mg total) by mouth daily., Disp: 90 tablet, Rfl: 2 .  Probiotic Product (PROBIOTIC PO), Take 1 tablet by mouth daily. , Disp: , Rfl:  .  traMADol (ULTRAM) 50 MG tablet, Take 0.5-1 tablets (25-50 mg total) by mouth 2 (two) times daily as needed., Disp: 120 tablet, Rfl: 0 No current facility-administered medications for this visit.   Facility-Administered Medications Ordered in Other Visits:  .  daratumumab (DARZALEX) 1,600 mg in sodium chloride 0.9 % 420 mL (3.2 mg/mL) chemo infusion, 15.7 mg/kg (Treatment Plan Recorded), Intravenous, Once, Jermika Olden R, MD .  sodium chloride flush (NS) 0.9 % injection 10  mL, 10 mL, Intracatheter, PRN, [name], MD, 10 mL at 12/18/16 1250  Allergies: No Known Allergies  Past Medical History, Surgical history, Social history, and Family History were reviewed and updated.  Review of Systems:as stated in the interim history  Phyical Exam:  vitals were not taken for this visit.    Physical Exam  Constitutional: He is oriented to person, place, and time.  HENT:  Head: Normocephalic and atraumatic.  Mouth/Throat: Oropharynx is clear and moist.  Eyes: EOM are normal. Pupils are equal, round, and reactive to light.  Neck: Normal range of motion.  Cardiovascular: Normal rate, regular rhythm and normal heart sounds.  Pulmonary/Chest: Effort normal and breath sounds normal.  Abdominal: Soft. Bowel sounds are normal.  Musculoskeletal: Normal range of motion.  He exhibits no edema, tenderness or deformity.  He has limited range of motion of the left shoulder.  There is some tenderness to palpation over the anterior portion of the left shoulder.  Lymphadenopathy:    He has no cervical adenopathy.  Neurological: He is alert and oriented to person, place, and time.  Skin: Skin is warm and dry. No rash noted. No erythema.  Psychiatric: He has a normal mood and affect. His behavior is normal. Judgment and thought content normal.  Vitals reviewed.    Lab Results  Component Value Date   WBC 4.8 03/04/2017   HGB 12.4 (L) 01/21/2017   HCT 36.5 (L) 03/04/2017   MCV 103.7 (H) 03/04/2017   PLT 222 03/04/2017     Chemistry      Component Value Date/Time   NA 147 (H) 02/04/2017 0822   NA 141 01/21/2017 0741   NA 139 06/14/2016 1246   K 3.7 02/04/2017 0822   K 4.0 01/21/2017 0741   K 3.8 06/14/2016 1246   CL 105 02/04/2017 0822   CL 103 01/21/2017 0741   CO2 27 02/04/2017 0822   CO2 30 01/21/2017 0741   CO2 24 06/14/2016 1246   BUN 10 02/04/2017 0822   BUN 20 01/21/2017 0741   BUN 15.0 06/14/2016 1246   CREATININE 0.80 02/04/2017 0822   CREATININE 1.0 01/21/2017 0741   CREATININE 0.9 06/14/2016 1246      Component Value Date/Time   CALCIUM 9.2 02/04/2017 0822   CALCIUM 9.0 01/21/2017 0741   CALCIUM 9.6 06/14/2016 1246   ALKPHOS 48 02/04/2017 0822   ALKPHOS 37 01/21/2017 0741   ALKPHOS 54 06/14/2016 1246   AST 20 02/04/2017 0822   AST 25 06/14/2016 1246   ALT 32 02/04/2017 0822   ALT 29 01/21/2017 0741   ALT 21 06/14/2016 1246   BILITOT 0.6 02/04/2017 0822   BILITOT 0.37 06/14/2016 1246       Impression and Plan: Mr. Hanley is a 77 year old gentleman with history of IgA kappa myeloma. 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 He was treated with standard therapy and got into remission. 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  We will go ahead and treat him today.   Again, I do not see  that healing will be delayed by his treatment.  I will plan to get him back in 2 more weeks.   [name], MD 2/11/20199:35 AM

## 2017-03-04 NOTE — Addendum Note (Signed)
Addended by: Burney Gauze R on: 03/04/2017 02:41 PM   Modules accepted: Orders

## 2017-03-05 DIAGNOSIS — M19012 Primary osteoarthritis, left shoulder: Secondary | ICD-10-CM | POA: Diagnosis not present

## 2017-03-05 LAB — IGG, IGA, IGM
IGA: 449 mg/dL — AB (ref 61–437)
IGG (IMMUNOGLOBIN G), SERUM: 320 mg/dL — AB (ref 700–1600)
IGM (IMMUNOGLOBULIN M), SRM: 8 mg/dL — AB (ref 15–143)

## 2017-03-05 LAB — KAPPA/LAMBDA LIGHT CHAINS
KAPPA FREE LGHT CHN: 13.5 mg/L (ref 3.3–19.4)
Kappa, lambda light chain ratio: 5.87 — ABNORMAL HIGH (ref 0.26–1.65)
LAMDA FREE LIGHT CHAINS: 2.3 mg/L — AB (ref 5.7–26.3)

## 2017-03-06 LAB — PROTEIN ELECTROPHORESIS, SERUM, WITH REFLEX
A/G RATIO SPE: 1.2 (ref 0.7–1.7)
ALBUMIN ELP: 3.2 g/dL (ref 2.9–4.4)
ALPHA-1-GLOBULIN: 0.2 g/dL (ref 0.0–0.4)
Alpha-2-Globulin: 1.1 g/dL — ABNORMAL HIGH (ref 0.4–1.0)
BETA GLOBULIN: 1 g/dL (ref 0.7–1.3)
GAMMA GLOBULIN: 0.4 g/dL (ref 0.4–1.8)
Globulin, Total: 2.7 g/dL (ref 2.2–3.9)
M-Spike, %: 0.1 g/dL — ABNORMAL HIGH
SPEP Interpretation: 0
TOTAL PROTEIN ELP: 5.9 g/dL — AB (ref 6.0–8.5)

## 2017-03-08 LAB — PROTEIN ELECTROPHORESIS, SERUM, WITH REFLEX
A/G RATIO SPE: 1.1 (ref 0.7–1.7)
ALBUMIN ELP: 3.3 g/dL (ref 2.9–4.4)
Alpha-1-Globulin: 0.3 g/dL (ref 0.0–0.4)
Alpha-2-Globulin: 1.2 g/dL — ABNORMAL HIGH (ref 0.4–1.0)
Beta Globulin: 1.3 g/dL (ref 0.7–1.3)
Gamma Globulin: 0.3 g/dL — ABNORMAL LOW (ref 0.4–1.8)
Globulin, Total: 3.1 g/dL (ref 2.2–3.9)
M-SPIKE, %: 0.2 g/dL — AB
SPEP Interpretation: 0
TOTAL PROTEIN ELP: 6.4 g/dL (ref 6.0–8.5)

## 2017-03-11 ENCOUNTER — Telehealth: Payer: Self-pay | Admitting: *Deleted

## 2017-03-11 NOTE — Telephone Encounter (Addendum)
Patient's wife is aware of results  ----- Message from [name], MD sent at 03/08/2017  5:05 PM EST ----- Call - the myeloma level is really holding stable!!  It is 0.2 g/dL.  This is wonderful!!  Film/video editor

## 2017-03-13 DIAGNOSIS — M75122 Complete rotator cuff tear or rupture of left shoulder, not specified as traumatic: Secondary | ICD-10-CM | POA: Diagnosis not present

## 2017-03-13 DIAGNOSIS — M25612 Stiffness of left shoulder, not elsewhere classified: Secondary | ICD-10-CM | POA: Diagnosis not present

## 2017-03-13 DIAGNOSIS — M6281 Muscle weakness (generalized): Secondary | ICD-10-CM | POA: Diagnosis not present

## 2017-03-13 DIAGNOSIS — M25512 Pain in left shoulder: Secondary | ICD-10-CM | POA: Diagnosis not present

## 2017-03-15 ENCOUNTER — Other Ambulatory Visit: Payer: Self-pay | Admitting: Internal Medicine

## 2017-03-15 ENCOUNTER — Other Ambulatory Visit: Payer: Self-pay | Admitting: *Deleted

## 2017-03-15 DIAGNOSIS — C9000 Multiple myeloma not having achieved remission: Secondary | ICD-10-CM

## 2017-03-18 ENCOUNTER — Inpatient Hospital Stay: Payer: PPO

## 2017-03-18 VITALS — BP 128/66 | HR 87 | Temp 98.1°F | Resp 18

## 2017-03-18 DIAGNOSIS — C9000 Multiple myeloma not having achieved remission: Secondary | ICD-10-CM

## 2017-03-18 LAB — CBC WITH DIFFERENTIAL (CANCER CENTER ONLY)
BASOS PCT: 0 %
Basophils Absolute: 0 10*3/uL (ref 0.0–0.1)
EOS ABS: 0.1 10*3/uL (ref 0.0–0.5)
EOS PCT: 1 %
HCT: 38.3 % — ABNORMAL LOW (ref 38.7–49.9)
HEMOGLOBIN: 12.5 g/dL — AB (ref 13.0–17.1)
LYMPHS ABS: 2.2 10*3/uL (ref 0.9–3.3)
Lymphocytes Relative: 36 %
MCH: 33.2 pg (ref 28.0–33.4)
MCHC: 32.6 g/dL (ref 32.0–35.9)
MCV: 101.9 fL — ABNORMAL HIGH (ref 82.0–98.0)
MONOS PCT: 14 %
Monocytes Absolute: 0.8 10*3/uL (ref 0.1–0.9)
NEUTROS PCT: 49 %
Neutro Abs: 3 10*3/uL (ref 1.5–6.5)
PLATELETS: 201 10*3/uL (ref 145–400)
RBC: 3.76 MIL/uL — ABNORMAL LOW (ref 4.20–5.70)
RDW: 14.5 % (ref 11.1–15.7)
WBC: 6.1 10*3/uL (ref 4.0–10.0)

## 2017-03-18 LAB — CMP (CANCER CENTER ONLY)
ALBUMIN: 3.5 g/dL (ref 3.5–5.0)
ALK PHOS: 51 U/L (ref 26–84)
ALT: 24 U/L (ref 10–47)
AST: 21 U/L (ref 11–38)
Anion gap: 10 (ref 5–15)
BILIRUBIN TOTAL: 0.5 mg/dL (ref 0.2–1.6)
BUN: 15 mg/dL (ref 7–22)
CALCIUM: 9.4 mg/dL (ref 8.0–10.3)
CO2: 28 mmol/L (ref 18–33)
CREATININE: 0.6 mg/dL (ref 0.60–1.20)
Chloride: 103 mmol/L (ref 98–108)
Glucose, Bld: 111 mg/dL (ref 73–118)
Potassium: 3.7 mmol/L (ref 3.3–4.7)
SODIUM: 141 mmol/L (ref 128–145)
Total Protein: 6.9 g/dL (ref 6.4–8.1)

## 2017-03-18 MED ORDER — ACETAMINOPHEN 325 MG PO TABS
650.0000 mg | ORAL_TABLET | Freq: Once | ORAL | Status: AC
  Administered 2017-03-18: 650 mg via ORAL

## 2017-03-18 MED ORDER — SODIUM CHLORIDE 0.9 % IV SOLN
Freq: Once | INTRAVENOUS | Status: AC
  Administered 2017-03-18: 09:00:00 via INTRAVENOUS

## 2017-03-18 MED ORDER — PROCHLORPERAZINE MALEATE 10 MG PO TABS
ORAL_TABLET | ORAL | Status: AC
  Filled 2017-03-18: qty 1

## 2017-03-18 MED ORDER — ACETAMINOPHEN 325 MG PO TABS
ORAL_TABLET | ORAL | Status: AC
  Filled 2017-03-18: qty 2

## 2017-03-18 MED ORDER — HEPARIN SOD (PORK) LOCK FLUSH 100 UNIT/ML IV SOLN
500.0000 [IU] | Freq: Once | INTRAVENOUS | Status: AC | PRN
  Administered 2017-03-18: 500 [IU]
  Filled 2017-03-18: qty 5

## 2017-03-18 MED ORDER — SODIUM CHLORIDE 0.9 % IV SOLN
1600.0000 mg | Freq: Once | INTRAVENOUS | Status: AC
  Administered 2017-03-18: 1600 mg via INTRAVENOUS
  Filled 2017-03-18: qty 80

## 2017-03-18 MED ORDER — SODIUM CHLORIDE 0.9% FLUSH
10.0000 mL | INTRAVENOUS | Status: DC | PRN
  Administered 2017-03-18: 10 mL
  Filled 2017-03-18: qty 10

## 2017-03-18 MED ORDER — DIPHENHYDRAMINE HCL 25 MG PO CAPS
50.0000 mg | ORAL_CAPSULE | Freq: Once | ORAL | Status: AC
  Administered 2017-03-18: 50 mg via ORAL

## 2017-03-18 MED ORDER — DIPHENHYDRAMINE HCL 25 MG PO CAPS
ORAL_CAPSULE | ORAL | Status: AC
  Filled 2017-03-18: qty 2

## 2017-03-18 MED ORDER — METHYLPREDNISOLONE SODIUM SUCC 125 MG IJ SOLR
125.0000 mg | Freq: Once | INTRAMUSCULAR | Status: AC
  Administered 2017-03-18: 125 mg via INTRAVENOUS

## 2017-03-18 MED ORDER — PROCHLORPERAZINE MALEATE 10 MG PO TABS
10.0000 mg | ORAL_TABLET | Freq: Once | ORAL | Status: AC
  Administered 2017-03-18: 10 mg via ORAL

## 2017-03-18 MED ORDER — METHYLPREDNISOLONE SODIUM SUCC 125 MG IJ SOLR
INTRAMUSCULAR | Status: AC
  Filled 2017-03-18: qty 2

## 2017-03-18 NOTE — Patient Instructions (Signed)
Daratumumab injection What is this medicine? DARATUMUMAB (dar a toom ue mab) is a monoclonal antibody. It is used to treat multiple myeloma. This medicine may be used for other purposes; ask your health care provider or pharmacist if you have questions. COMMON BRAND NAME(S): DARZALEX What should I tell my health care provider before I take this medicine? They need to know if you have any of these conditions: -infection (especially a virus infection such as chickenpox, cold sores, or herpes) -lung or breathing disease -pregnant or trying to get pregnant -breast-feeding -an unusual or allergic reaction to daratumumab, other medicines, foods, dyes, or preservatives How should I use this medicine? This medicine is for infusion into a vein. It is given by a health care professional in a hospital or clinic setting. Talk to your pediatrician regarding the use of this medicine in children. Special care may be needed. Overdosage: If you think you have taken too much of this medicine contact a poison control center or emergency room at once. NOTE: This medicine is only for you. Do not share this medicine with others. What if I miss a dose? Keep appointments for follow-up doses as directed. It is important not to miss your dose. Call your doctor or health care professional if you are unable to keep an appointment. What may interact with this medicine? Interactions have not been studied. Give your health care provider a list of all the medicines, herbs, non-prescription drugs, or dietary supplements you use. Also tell them if you smoke, drink alcohol, or use illegal drugs. Some items may interact with your medicine. This list may not describe all possible interactions. Give your health care provider a list of all the medicines, herbs, non-prescription drugs, or dietary supplements you use. Also tell them if you smoke, drink alcohol, or use illegal drugs. Some items may interact with your medicine. What  should I watch for while using this medicine? This drug may make you feel generally unwell. Report any side effects. Continue your course of treatment even though you feel ill unless your doctor tells you to stop. This medicine can cause serious allergic reactions. To reduce your risk you may need to take medicine before treatment with this medicine. Take your medicine as directed. This medicine can affect the results of blood tests to match your blood type. These changes can last for up to 6 months after the final dose. Your healthcare provider will do blood tests to match your blood type before you start treatment. Tell all of your healthcare providers that you are being treated with this medicine before receiving a blood transfusion. This medicine can affect the results of some tests used to determine treatment response; extra tests may be needed to evaluate response. Do not become pregnant while taking this medicine or for 3 months after stopping it. Women should inform their doctor if they wish to become pregnant or think they might be pregnant. There is a potential for serious side effects to an unborn child. Talk to your health care professional or pharmacist for more information. What side effects may I notice from receiving this medicine? Side effects that you should report to your doctor or health care professional as soon as possible: -allergic reactions like skin rash, itching or hives, swelling of the face, lips, or tongue -breathing problems -chills -cough -dizziness -feeling faint or lightheaded -headache -low blood counts - this medicine may decrease the number of white blood cells, red blood cells and platelets. You may be at increased risk  for infections and bleeding. -nausea, vomiting -shortness of breath -signs of decreased platelets or bleeding - bruising, pinpoint red spots on the skin, black, tarry stools, blood in the urine -signs of decreased red blood cells - unusually  weak or tired, feeling faint or lightheaded, falls -signs of infection - fever or chills, cough, sore throat, pain or difficulty passing urine Side effects that usually do not require medical attention (report to your doctor or health care professional if they continue or are bothersome): -back pain -diarrhea -muscle cramps -pain, tingling, numbness in the hands or feet -swelling of the ankles, feet, hands -tiredness This list may not describe all possible side effects. Call your doctor for medical advice about side effects. You may report side effects to FDA at 1-800-FDA-1088. Where should I keep my medicine? Keep out of the reach of children. This drug is given in a hospital or clinic and will not be stored at home. NOTE: This sheet is a summary. It may not cover all possible information. If you have questions about this medicine, talk to your doctor, pharmacist, or health care provider.  2018 Elsevier/Gold Standard (2015-02-10 10:38:11)  

## 2017-03-19 LAB — IGG, IGA, IGM
IgA: 432 mg/dL (ref 61–437)
IgG (Immunoglobin G), Serum: 308 mg/dL — ABNORMAL LOW (ref 700–1600)
IgM (Immunoglobulin M), Srm: 9 mg/dL — ABNORMAL LOW (ref 15–143)

## 2017-03-19 LAB — KAPPA/LAMBDA LIGHT CHAINS
KAPPA FREE LGHT CHN: 11.3 mg/L (ref 3.3–19.4)
KAPPA, LAMDA LIGHT CHAIN RATIO: 4.91 — AB (ref 0.26–1.65)
LAMDA FREE LIGHT CHAINS: 2.3 mg/L — AB (ref 5.7–26.3)

## 2017-03-19 NOTE — Progress Notes (Addendum)
Subjective:   Gene Hill is a 77 y.o. male who presents for Medicare Annual/Subsequent preventive examination.  Review of Systems: No ROS.  Medicare Wellness Visit. Additional risk factors are reflected in the social history. Cardiac Risk Factors include: hypertension;male gender;dyslipidemia;advanced age (>19mn, >>35women);obesity (BMI >30kg/m2) Sleep patterns: Wakes 3-4x to urinate. Sleep varies especially since rotator cuff sx.  Home Safety/Smoke Alarms: Feels safe in home. Smoke alarms in place.  Living environment; residence and Firearm Safety: 2 story. Lives with wife and son.   Seat Belt Safety/Bike Helmet: Wears seat belt.  Male:   CCS- last 2004    PSA-  Lab Results  Component Value Date   PSA 1.03 08/23/2011   PSA 0.96 04/06/2009   PSA 0.94 07/07/2007   Dentist every 6 months.    Objective:    Vitals: BP (!) 142/82 (BP Location: Right Arm, Patient Position: Sitting, Cuff Size: Normal)   Pulse 86   Wt 221 lb 6.4 oz (100.4 kg)   SpO2 99%   BMI 35.73 kg/m   Body mass index is 35.73 kg/m.  Advanced Directives 03/21/2017 03/04/2017 02/04/2017 01/21/2017 01/08/2017 12/25/2016 12/18/2016  Does Patient Have a Medical Advance Directive? _0  Yes Yes  Type of Advance Directive Living will;Healthcare Power of [place] will [place] will [place] will Living will Living will [place]Living will  Does patient want to make changes to medical advance directive? No - Patient declined - No - Patient declined - No - Patient declined - No - Patient declined  Copy of HCenterin Chart? Yes - Yes Yes No - copy requested - No - copy requested  Would patient like information on creating a medical advance directive? - - No - Patient declined - No - Patient declined No - Patient declined No - Patient declined    Tobacco Social History    Tobacco Use  Smoking Status Former Smoker  . Packs/day: 0.25  . Years: 20.00  . Pack years: 5.00  . Types: Cigarettes  . Start date: 04/28/1954  . Last attempt to quit: 11/28/1974  . Years since quitting: 42.3  Smokeless Tobacco Never Used  Tobacco Comment   quit 35 years ago, 1976     Counseling given: Not Answered Comment: quit 35 years ago, 1976   Clinical Intake:     Pain : 0-10 Pain Score: 1  Pain Location: Shoulder Pain Orientation: Left Pain Onset: More than a month ago Pain Relieving Factors: pain meds    Past Medical History:  Diagnosis Date  . Arthritis   . 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 Blood dyscrasia    low WBC d/t multiple myeloma  . Blood transfusion    hx of  . Carotid bruit    3-11 carotid u/s was  (-)  . 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 GERD (gastroesophageal reflux disease)   . Headache   . Heart murmur   . HYPERLIPIDEMIA 05/03/2006  . HYPERTENSION 05/03/2006  . 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 Torn rotator cuff 01/22/2017   left- [facility]   Past Surgical History:  Procedure Laterality Date  . CERVICAL FUSION    . IR FLUORO  GUIDE PORT INSERTION RIGHT  11/29/2016  . IR US GUIDE VASC ACCESS RIGHT  11/29/2016  . [place]   right  . ROTATOR CUFF REPAIR     right  . SPINAL FUSION    . TOTAL KNEE ARTHROPLASTY Right 12/23/2015   Procedure: TOTAL KNEE ARTHROPLASTY;  Surgeon: Earlie Server, MD;  Location: [facility];  Service: Orthopedics;  Laterality: Right;   Family History  Problem Relation Age of Onset  . Kidney cancer Father   . Leukemia Mother   . Pulmonary embolism Brother   . Stroke Maternal Grandmother   . Heart attack Maternal Grandfather   . Stroke Paternal Grandmother   . Cerebral palsy Son   . Prostate cancer Neg  Hx   . Colon cancer Neg Hx   . Esophageal cancer Neg Hx   . Rectal cancer Neg Hx   . Stomach cancer Neg Hx    Social History   Socioeconomic History  . Marital status: Married    Spouse name: None  . Number of children: 4  . Years of education: None  . Highest education level: None  Social Needs  . Financial resource strain: None  . Food insecurity - worry: None  . Food insecurity - inability: None  . Transportation needs - medical: None  . Transportation needs - non-medical: None  Occupational History  . Occupation: retired  Tobacco Use  . Smoking status: Former Smoker    Packs/day: 0.25    Years: 20.00    Pack years: 5.00    Types: Cigarettes    Start date: 04/28/1954    Last attempt to quit: 11/28/1974    Years since quitting: 42.3  . Smokeless tobacco: Never Used  . Tobacco comment: quit 35 years ago, 1976  Substance and Sexual Activity  . Alcohol use: Yes    Alcohol/week: 0.0 oz    Comment: Rarely   . Drug use: No  . Sexual activity: No  Other Topics Concern  . None  Social History Narrative   Household-- pt, wife, son    Outpatient Encounter Medications as of 03/21/2017  Medication Sig  . Daratumumab (DARZALEX IV) Inject into the vein every 14 (fourteen) days.  Marland Kitchen aspirin 325 MG tablet Take 325 mg by mouth daily.  . cyclobenzaprine (FLEXERIL) 10 MG tablet Take 1 tablet (10 mg total) by mouth at bedtime as needed for muscle spasms.  Marland Kitchen dexamethasone (DECADRON) 2 MG tablet Take 26m day after chemo then 260mdaily x 4  . lidocaine (XYLOCAINE) 5 % ointment Apply 1 application topically 2 (two) times daily as needed. Mix w/ OTC Hydrocortisone 1 %  . metoprolol tartrate (LOPRESSOR) 100 MG tablet Take 0.5 tablets (50 mg total) by mouth 2 (two) times daily.  . Multiple Vitamin (MULTIVITAMIN) tablet Take 1 tablet by mouth daily.  . Omega-3 Fatty Acids (FISH OIL) 1200 MG CAPS Take 1,200 mg by mouth daily.  . Marland KitchenxyCODONE-acetaminophen (PERCOCET/ROXICET) 5-325 MG tablet TK 1  TO 2 TS PO Q 4 TO 6 H PRN P  . pantoprazole (PROTONIX) 40 MG tablet Take 1 tablet (40 mg total) by mouth daily.  . Probiotic Product (PROBIOTIC PO) Take 1 tablet by mouth daily.   . traMADol (ULTRAM) 50 MG tablet Take 0.5-1 tablets (25-50 mg total) by mouth 2 (two) times daily as needed.  . [DISCONTINUED] amoxicillin-clavulanate (AUGMENTIN) 875-125 MG tablet Take 1 tablet by mouth 2 (two) times daily.  . [DISCONTINUED] montelukast (SINGULAIR) 10 MG tablet TAKE 1 TABLET BY  MOUTH EVERY DAY STARTING ON 11/29/16   Facility-Administered Encounter Medications as of 03/21/2017  Medication  . daratumumab (DARZALEX) 1,600 mg in sodium chloride 0.9 % 420 mL (3.2 mg/mL) chemo infusion  . sodium chloride flush (NS) 0.9 % injection 10 mL    Activities of Daily Living In your present state of health, do you have any difficulty performing the following activities: 03/21/2017 11/29/2016  Hearing? N N  Vision? N N  Comment wearing glasses. Eye doctor yearly per pt.  -  Difficulty concentrating or making decisions? N N  Walking or climbing stairs? N N  Dressing or bathing? N N  Doing errands, shopping? N -  Preparing Food and eating ? N -  Using the Toilet? N -  In the past six months, have you accidently leaked urine? N -  Do you have problems with loss of bowel control? N -  Managing your Medications? N -  Managing your Finances? N -  Housekeeping or managing your Housekeeping? N -  Some recent data might be hidden    Patient Care Team: Colon Branch, MD as PCP - General Marin Olp, Rudell Cobb, MD as Consulting Physician (Oncology) Constance Haw, MD as Consulting Physician (Cardiology) Earlie Server, MD as Consulting Physician (Orthopedic Surgery)   Assessment:   This is a routine wellness examination for Gene Hill. Physical assessment deferred to PCP.  Exercise Activities and Dietary recommendations   Diet (meal preparation, eat out, water intake, caffeinated beverages, dairy products, fruits and  vegetables): Eats 2 meals per day with snacks and ice cream nightly.    Goals    . Lose 10 lbs by next year.   (pt-stated)     Weight today 221. Goal 210       Fall Risk Fall Risk  03/21/2017 11/22/2016 01/11/2016 11/23/2015 10/04/2015  Falls in the past year? _0   Number falls in past yr: - - - - -  Injury with Fall? - - - - -  Risk for fall due to : - - - - -  Risk for fall due to: Comment - - - - -     Depression Screen PHQ 2/9 Scores 03/21/2017 11/22/2016 08/22/2015 03/21/2015  PHQ - 2 Score 0 0 0 0    Cognitive Function MMSE - Mini Mental State Exam 03/21/2017 03/21/2015  Orientation to time 5 5  Orientation to Place 5 5  Registration 3 3  Attention/ Calculation 5 5  Recall 3 3  Language- name 2 objects 2 2  Language- repeat 1 1  Language- follow 3 step command 3 3  Language- read & follow direction 1 1  Write a sentence 1 1  Copy design 1 1  Total score 30 30        Immunization History  Administered Date(s) Administered  . Influenza Split 11/13/2011  . Influenza Whole 01/20/2007, 01/04/2009  . Influenza,inj,Quad PF,6+ Mos 10/10/2012  . Influenza-Unspecified 10/27/2014, 10/04/2015, 11/01/2016  . Pneumococcal Conjugate-13 02/05/2014  . Pneumococcal Polysaccharide-23 01/22/2006  . Td 01/23/2011    Screening Tests Health Maintenance  Topic Date Due  . TETANUS/TDAP  01/22/2021  . INFLUENZA VACCINE  Completed  . PNA vac Low Risk Adult  Completed    Plan:   Follow with PCP as directed  Continue to eat heart healthy diet (full of fruits, vegetables, whole grains, lean protein, water--limit salt, fat, and sugar intake) and increase physical activity as tolerated.  Continue doing brain stimulating activities (puzzles, reading, adult coloring  books, staying active) to keep memory sharp.    I have personally reviewed and noted the following in the patient's chart:   . Medical and social history . Use of alcohol, tobacco or illicit drugs  . Current  medications and supplements . Functional ability and status . Nutritional status . Physical activity . Advanced directives . List of other physicians . Hospitalizations, surgeries, and ER visits in previous 12 months . Vitals . Screenings to include cognitive, depression, and falls . Referrals and appointments  In addition, I have reviewed and discussed with patient certain preventive protocols, quality metrics, and best practice recommendations. A written personalized care plan for preventive services as well as general preventive health recommendations were provided to patient.     Naaman Plummer [place], [place]  03/21/2017  [name], MD

## 2017-03-20 DIAGNOSIS — M25512 Pain in left shoulder: Secondary | ICD-10-CM | POA: Diagnosis not present

## 2017-03-20 DIAGNOSIS — M75122 Complete rotator cuff tear or rupture of left shoulder, not specified as traumatic: Secondary | ICD-10-CM | POA: Diagnosis not present

## 2017-03-20 DIAGNOSIS — M25612 Stiffness of left shoulder, not elsewhere classified: Secondary | ICD-10-CM | POA: Diagnosis not present

## 2017-03-20 DIAGNOSIS — M6281 Muscle weakness (generalized): Secondary | ICD-10-CM | POA: Diagnosis not present

## 2017-03-21 ENCOUNTER — Ambulatory Visit (INDEPENDENT_AMBULATORY_CARE_PROVIDER_SITE_OTHER): Payer: PPO | Admitting: Internal Medicine

## 2017-03-21 ENCOUNTER — Encounter: Payer: Self-pay | Admitting: *Deleted

## 2017-03-21 ENCOUNTER — Ambulatory Visit (INDEPENDENT_AMBULATORY_CARE_PROVIDER_SITE_OTHER): Payer: PPO | Admitting: *Deleted

## 2017-03-21 ENCOUNTER — Encounter: Payer: Self-pay | Admitting: Internal Medicine

## 2017-03-21 VITALS — BP 142/82 | HR 86 | Wt 221.4 lb

## 2017-03-21 VITALS — BP 142/82 | HR 86 | Resp 16 | Ht 66.0 in | Wt 221.4 lb

## 2017-03-21 DIAGNOSIS — I10 Essential (primary) hypertension: Secondary | ICD-10-CM

## 2017-03-21 DIAGNOSIS — E785 Hyperlipidemia, unspecified: Secondary | ICD-10-CM

## 2017-03-21 DIAGNOSIS — R7303 Prediabetes: Secondary | ICD-10-CM

## 2017-03-21 DIAGNOSIS — Z0000 Encounter for general adult medical examination without abnormal findings: Secondary | ICD-10-CM

## 2017-03-21 DIAGNOSIS — C9000 Multiple myeloma not having achieved remission: Secondary | ICD-10-CM | POA: Diagnosis not present

## 2017-03-21 DIAGNOSIS — I2510 Atherosclerotic heart disease of native coronary artery without angina pectoris: Secondary | ICD-10-CM | POA: Diagnosis not present

## 2017-03-21 LAB — PROTEIN ELECTROPHORESIS, SERUM, WITH REFLEX
A/G RATIO SPE: 1.2 (ref 0.7–1.7)
ALBUMIN ELP: 3.7 g/dL (ref 2.9–4.4)
ALPHA-1-GLOBULIN: 0.2 g/dL (ref 0.0–0.4)
Alpha-2-Globulin: 1.3 g/dL — ABNORMAL HIGH (ref 0.4–1.0)
Beta Globulin: 1.3 g/dL (ref 0.7–1.3)
GAMMA GLOBULIN: 0.3 g/dL — AB (ref 0.4–1.8)
Globulin, Total: 3.1 g/dL (ref 2.2–3.9)
M-Spike, %: 0.2 g/dL — ABNORMAL HIGH
SPEP INTERP: 0
TOTAL PROTEIN ELP: 6.8 g/dL (ref 6.0–8.5)

## 2017-03-21 NOTE — Patient Instructions (Signed)
   GO TO THE FRONT DESK Schedule your next appointment for a physical exam in 1 year  The next time you get labs done at hematology, please asked him to get a A1c (diabetes0 and TSH (DX CPX)    Check the  blood pressure  weekly   Be sure your blood pressure is between 110/65 and  135/85. If it is consistently higher or lower, let me know

## 2017-03-21 NOTE — Patient Instructions (Signed)
Continue to eat heart healthy diet (full of fruits, vegetables, whole grains, lean protein, water--limit salt, fat, and sugar intake) and increase physical activity as tolerated.  Continue doing brain stimulating activities (puzzles, reading, adult coloring books, staying active) to keep memory sharp.    Gene Hill , Thank you for taking time to come for your Medicare Wellness Visit. I appreciate your ongoing commitment to your health goals. Please review the following plan we discussed and let me know if I can assist you in the future.   These are the goals we discussed: Goals    . Lose 10 lbs by next year.   (pt-stated)     Weight today 221. Goal 210       This is a list of the screening recommended for you and due dates:  Health Maintenance  Topic Date Due  . Tetanus Vaccine  01/22/2021  . Flu Shot  Completed  . Pneumonia vaccines  Completed    Health Maintenance, Male A healthy lifestyle and preventive care is important for your health and wellness. Ask your health care provider about what schedule of regular examinations is right for you. What should I know about weight and diet? Eat a Healthy Diet  Eat plenty of vegetables, fruits, whole grains, low-fat dairy products, and lean protein.  Do not eat a lot of foods high in solid fats, added sugars, or salt.  Maintain a Healthy Weight Regular exercise can help you achieve or maintain a healthy weight. You should:  Do at least 150 minutes of exercise each week. The exercise should increase your heart rate and make you sweat (moderate-intensity exercise).  Do strength-training exercises at least twice a week.  Watch Your Levels of Cholesterol and Blood Lipids  Have your blood tested for lipids and cholesterol every 5 years starting at 79 years of age. If you are at high risk for heart disease, you should start having your blood tested when you are 79 years old. You may need to have your cholesterol levels checked more often  if: ? Your lipid or cholesterol levels are high. ? You are older than 79 years of age. ? You are at high risk for heart disease.  What should I know about cancer screening? Many types of cancers can be detected early and may often be prevented. Lung Cancer  You should be screened every year for lung cancer if: ? You are a current smoker who has smoked for at least 30 years. ? You are a former smoker who has quit within the past 15 years.  Talk to your health care provider about your screening options, when you should start screening, and how often you should be screened.  Colorectal Cancer  Routine colorectal cancer screening usually begins at 79 years of age and should be repeated every 5-10 years until you are 79 years old. You may need to be screened more often if early forms of precancerous polyps or small growths are found. Your health care provider may recommend screening at an earlier age if you have risk factors for colon cancer.  Your health care provider may recommend using home test kits to check for hidden blood in the stool.  A small camera at the end of a tube can be used to examine your colon (sigmoidoscopy or colonoscopy). This checks for the earliest forms of colorectal cancer.  Prostate and Testicular Cancer  Depending on your age and overall health, your health care provider may do certain tests to screen  for prostate and testicular cancer.  Talk to your health care provider about any symptoms or concerns you have about testicular or prostate cancer.  Skin Cancer  Check your skin from head to toe regularly.  Tell your health care provider about any new moles or changes in moles, especially if: ? There is a change in a mole's size, shape, or color. ? You have a mole that is larger than a pencil eraser.  Always use sunscreen. Apply sunscreen liberally and repeat throughout the day.  Protect yourself by wearing long sleeves, pants, a wide-brimmed hat, and  sunglasses when outside.  What should I know about heart disease, diabetes, and high blood pressure?  If you are 25-37 years of age, have your blood pressure checked every 3-5 years. If you are 58 years of age or older, have your blood pressure checked every year. You should have your blood pressure measured twice-once when you are at a hospital or clinic, and once when you are not at a hospital or clinic. Record the average of the two measurements. To check your blood pressure when you are not at a hospital or clinic, you can use: ? An automated blood pressure machine at a pharmacy. ? A home blood pressure monitor.  Talk to your health care provider about your target blood pressure.  If you are between 29-48 years old, ask your health care provider if you should take aspirin to prevent heart disease.  Have regular diabetes screenings by checking your fasting blood sugar level. ? If you are at a normal weight and have a low risk for diabetes, have this test once every three years after the age of 62. ? If you are overweight and have a high risk for diabetes, consider being tested at a younger age or more often.  A one-time screening for abdominal aortic aneurysm (AAA) by ultrasound is recommended for men aged 61-75 years who are current or former smokers. What should I know about preventing infection? Hepatitis B If you have a higher risk for hepatitis B, you should be screened for this virus. Talk with your health care provider to find out if you are at risk for hepatitis B infection. Hepatitis C Blood testing is recommended for:  Everyone born from [birth year] through 1967.  Anyone with known risk factors for hepatitis C.  Sexually Transmitted Diseases (STDs)  You should be screened each year for STDs including gonorrhea and chlamydia if: ? You are sexually active and are younger than 79 years of age. ? You are older than 79 years of age and your health care provider tells you that you are  at risk for this type of infection. ? Your sexual activity has changed since you were last screened and you are at an increased risk for chlamydia or gonorrhea. Ask your health care provider if you are at risk.  Talk with your health care provider about whether you are at high risk of being infected with HIV. Your health care provider may recommend a prescription medicine to help prevent HIV infection.  What else can I do?  Schedule regular health, dental, and eye exams.  Stay current with your vaccines (immunizations).  Do not use any tobacco products, such as cigarettes, chewing tobacco, and e-cigarettes. If you need help quitting, ask your health care provider.  Limit alcohol intake to no more than 2 drinks per day. One drink equals 12 ounces of beer, 5 ounces of wine, or 1 ounces of hard liquor.  Do  not use street drugs.  Do not share needles.  Ask your health care provider for help if you need support or information about quitting drugs.  Tell your health care provider if you often feel depressed.  Tell your health care provider if you have ever been abused or do not feel safe at home. This information is not intended to replace advice given to you by your health care provider. Make sure you discuss any questions you have with your health care provider. Document Released: 07/07/2007 Document Revised: 09/07/2015 Document Reviewed: 10/12/2014 Elsevier Interactive Patient Education  2018 Elsevier Inc.  

## 2017-03-21 NOTE — Assessment & Plan Note (Signed)
-  Td 2013; pneumonia shot 2008 and~ 2011 per Pt ; prevnar-- 2016;  shingrix not available  -No further prostate or colon cancer screening. See previous entries  - diet  and exercise discusse -Labs reviewed, needs TSH and A1c, see instructions.

## 2017-03-21 NOTE — Progress Notes (Signed)
Pre visit review using our clinic review tool, if applicable. No additional management support is needed unless otherwise documented below in the visit note. 

## 2017-03-21 NOTE — Progress Notes (Signed)
Subjective:    Patient ID: Gene Hill, male    DOB: 18-Aug-1940, 77 y.o.   MRN: [id number]  DOS:  03/21/2017 Type of visit - description : cpx Interval history: Here with his wife, he feels physically and emotionally well He is recuperating from left shoulder surgery, still having some pain.  BP Readings from Last 3 Encounters:  03/21/17 (!) 142/82  03/21/17 (!) 142/82  03/18/17 128/66   Review of Systems   Other than above, a 14 point review of systems is negative     Past Medical History:  Diagnosis Date  . Arthritis   . 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 Blood dyscrasia    low WBC d/t multiple myeloma  . Blood transfusion    hx of  . Carotid bruit    3-11 carotid u/s was  (-)  . 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 GERD (gastroesophageal reflux disease)   . Headache   . Heart murmur   . HYPERLIPIDEMIA 05/03/2006  . HYPERTENSION 05/03/2006  . 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 Torn rotator cuff 01/22/2017   left- [facility]    Past Surgical History:  Procedure Laterality Date  . CERVICAL FUSION    . IR FLUORO GUIDE PORT INSERTION RIGHT  11/29/2016  . IR US GUIDE VASC ACCESS RIGHT  11/29/2016  . [place]   right  . ROTATOR CUFF REPAIR     right  . SPINAL FUSION    . TOTAL KNEE ARTHROPLASTY Right 12/23/2015   Procedure: TOTAL KNEE ARTHROPLASTY;  Surgeon: Earlie Server, MD;  Location: [facility];  Service: Orthopedics;  Laterality: Right;    Social History   Socioeconomic History  . Marital status: Married    Spouse name: Not on file  . Number of children: 4  . Years of education: Not on file  . Highest education level: Not on file  Social Needs  . Financial resource strain: Not on file  . Food insecurity - worry: Not on file  . Food insecurity -  inability: Not on file  . Transportation needs - medical: Not on file  . Transportation needs - non-medical: Not on file  Occupational History  . Occupation: retired  Tobacco Use  . Smoking status: Former Smoker    Packs/day: 0.25    Years: 20.00    Pack years: 5.00    Types: Cigarettes    Start date: 04/28/1954    Last attempt to quit: 11/28/1974    Years since quitting: 42.3  . Smokeless tobacco: Never Used  . Tobacco comment: quit 35 years ago, 1976  Substance and Sexual Activity  . Alcohol use: Yes    Alcohol/week: 0.0 oz    Comment: Rarely   . Drug use: No  . Sexual activity: No  Other Topics Concern  . Not on file  Social History Narrative   Household-- pt, wife, son     Family History  Problem Relation Age of Onset  . Kidney cancer Father   . Leukemia Mother   . Pulmonary embolism Brother   . Stroke Maternal Grandmother   . Heart attack Maternal Grandfather   . Stroke Paternal Grandmother   . Cerebral palsy Son   . Prostate cancer Neg Hx   .  Colon cancer Neg Hx   . Esophageal cancer Neg Hx   . Rectal cancer Neg Hx   . Stomach cancer Neg Hx      Allergies as of 03/21/2017   No Known Allergies     Medication List        Accurate as of 03/21/17 11:59 PM. Always use your most recent med list.          aspirin 325 MG tablet Take 325 mg by mouth daily.   cyclobenzaprine 10 MG tablet Commonly known as:  FLEXERIL Take 1 tablet (10 mg total) by mouth at bedtime as needed for muscle spasms.   DARZALEX IV Inject into the vein every 14 (fourteen) days.   dexamethasone 2 MG tablet Commonly known as:  DECADRON Take 41m day after chemo then 231mdaily x 4   Fish Oil 1200 MG Caps Take 1,200 mg by mouth daily.   lidocaine 5 % ointment Commonly known as:  XYLOCAINE Apply 1 application topically 2 (two) times daily as needed. Mix w/ OTC Hydrocortisone 1 %   metoprolol tartrate 100 MG tablet Commonly known as:  LOPRESSOR Take 0.5 tablets (50 mg total) by  mouth 2 (two) times daily.   multivitamin tablet Take 1 tablet by mouth daily.   oxyCODONE-acetaminophen 5-325 MG tablet Commonly known as:  PERCOCET/ROXICET TK 1 TO 2 TS PO Q 4 TO 6 H PRN P   pantoprazole 40 MG tablet Commonly known as:  PROTONIX Take 1 tablet (40 mg total) by mouth daily.   PROBIOTIC PO Take 1 tablet by mouth daily.   traMADol 50 MG tablet Commonly known as:  ULTRAM Take 0.5-1 tablets (25-50 mg total) by mouth 2 (two) times daily as needed.          Objective:   Physical Exam BP (!) 142/82 (BP Location: Right Arm, Patient Position: Sitting, Cuff Size: Normal)   Pulse 86   Resp 16   Ht _0  (1.676 m)   Wt 221 lb 6.4 oz (100.4 kg)   SpO2 99%   BMI 35.73 kg/m  General:   Well developed, well nourished . NAD.  Neck: No  thyromegaly  HEENT:  Normocephalic . Face symmetric, atraumatic Lungs:  CTA B Normal respiratory effort, no intercostal retractions, no accessory muscle use. Heart: RRR,  no murmur.  No pretibial edema bilaterally  Abdomen:  Not distended, soft, non-tender. No rebound or rigidity.   Skin: Exposed areas without rash. Not pale. Not jaundice Neurologic:  alert & oriented X3.  Speech normal, gait appropriate for age and unassisted Strength symmetric and appropriate for age.  Psych: Cognition and judgment appear intact.  Cooperative with normal attention span and concentration.  Behavior appropriate. No anxious or depressed appearing.     Assessment & Plan:   Assessment Pre-diabetes HTN Hyperlipidemia (self dc  simva 11-2014)  CAD by cath 1998, medical management, Cardiolite ~ 2007 ? Multiple myeloma -- stem cell transplant 2011, with recurrence as off 01-2015  GI: --Barrett' esophagus EGD 2010, 2013 --PUD 2010  [facility] -- generalized aches, pains, chronic (no better after dc simvastatin ~ 11-2014) -- DJD Dr MuPercell Millerypogonadism Carotid bruit, [place] 2011  PLAN  Prediabetes: See AVS, will get A1c in the near  future. HTN: Ambulatory BPs always within normal, continue metoprolol. Hyperlipidemia: Diet controlled CAD: Asx Multiple myeloma: Closely follow-up by hematology RTC 1 year

## 2017-03-22 NOTE — Assessment & Plan Note (Signed)
Prediabetes: See AVS, will get A1c in the near future. HTN: Ambulatory BPs always within normal, continue metoprolol. Hyperlipidemia: Diet controlled CAD: Asx Multiple myeloma: Closely follow-up by hematology RTC 1 year

## 2017-03-25 DIAGNOSIS — M25612 Stiffness of left shoulder, not elsewhere classified: Secondary | ICD-10-CM | POA: Diagnosis not present

## 2017-03-25 DIAGNOSIS — M6281 Muscle weakness (generalized): Secondary | ICD-10-CM | POA: Diagnosis not present

## 2017-03-25 DIAGNOSIS — M75122 Complete rotator cuff tear or rupture of left shoulder, not specified as traumatic: Secondary | ICD-10-CM | POA: Diagnosis not present

## 2017-03-25 DIAGNOSIS — M25512 Pain in left shoulder: Secondary | ICD-10-CM | POA: Diagnosis not present

## 2017-04-01 ENCOUNTER — Inpatient Hospital Stay: Payer: PPO

## 2017-04-01 ENCOUNTER — Other Ambulatory Visit: Payer: Self-pay | Admitting: *Deleted

## 2017-04-01 ENCOUNTER — Inpatient Hospital Stay: Payer: PPO | Attending: Hematology & Oncology

## 2017-04-01 VITALS — BP 143/79 | HR 90 | Temp 98.1°F | Resp 18

## 2017-04-01 DIAGNOSIS — E118 Type 2 diabetes mellitus with unspecified complications: Secondary | ICD-10-CM

## 2017-04-01 DIAGNOSIS — C9000 Multiple myeloma not having achieved remission: Secondary | ICD-10-CM | POA: Diagnosis not present

## 2017-04-01 DIAGNOSIS — Z79899 Other long term (current) drug therapy: Secondary | ICD-10-CM | POA: Insufficient documentation

## 2017-04-01 DIAGNOSIS — Z5111 Encounter for antineoplastic chemotherapy: Secondary | ICD-10-CM | POA: Diagnosis not present

## 2017-04-01 LAB — CBC WITH DIFFERENTIAL (CANCER CENTER ONLY)
BASOS ABS: 0 10*3/uL (ref 0.0–0.1)
Basophils Relative: 0 %
EOS ABS: 0.1 10*3/uL (ref 0.0–0.5)
EOS PCT: 2 %
HCT: 36.4 % — ABNORMAL LOW (ref 38.7–49.9)
Hemoglobin: 11.6 g/dL — ABNORMAL LOW (ref 13.0–17.1)
LYMPHS ABS: 1.3 10*3/uL (ref 0.9–3.3)
LYMPHS PCT: 33 %
MCH: 32.8 pg (ref 28.0–33.4)
MCHC: 31.9 g/dL — ABNORMAL LOW (ref 32.0–35.9)
MCV: 102.8 fL — AB (ref 82.0–98.0)
MONO ABS: 0.7 10*3/uL (ref 0.1–0.9)
Monocytes Relative: 17 %
Neutro Abs: 1.9 10*3/uL (ref 1.5–6.5)
Neutrophils Relative %: 48 %
PLATELETS: 172 10*3/uL (ref 145–400)
RBC: 3.54 MIL/uL — ABNORMAL LOW (ref 4.20–5.70)
RDW: 14.6 % (ref 11.1–15.7)
WBC: 4 10*3/uL (ref 4.0–10.0)

## 2017-04-01 LAB — CMP (CANCER CENTER ONLY)
ALBUMIN: 3.3 g/dL — AB (ref 3.5–5.0)
ALK PHOS: 39 U/L (ref 26–84)
ALT: 23 U/L (ref 10–47)
AST: 18 U/L (ref 11–38)
Anion gap: 3 — ABNORMAL LOW (ref 5–15)
BUN: 12 mg/dL (ref 7–22)
CO2: 28 mmol/L (ref 18–33)
CREATININE: 1.1 mg/dL (ref 0.60–1.20)
Calcium: 9 mg/dL (ref 8.0–10.3)
Chloride: 109 mmol/L — ABNORMAL HIGH (ref 98–108)
GLUCOSE: 100 mg/dL (ref 73–118)
POTASSIUM: 3.4 mmol/L (ref 3.3–4.7)
SODIUM: 140 mmol/L (ref 128–145)
TOTAL PROTEIN: 6.1 g/dL — AB (ref 6.4–8.1)
Total Bilirubin: 0.5 mg/dL (ref 0.2–1.6)

## 2017-04-01 LAB — TSH: TSH: 2.249 u[IU]/mL (ref 0.320–4.118)

## 2017-04-01 LAB — HEMOGLOBIN A1C
HEMOGLOBIN A1C: 5.6 % (ref 4.8–5.6)
Mean Plasma Glucose: 114.02 mg/dL

## 2017-04-01 MED ORDER — DIPHENHYDRAMINE HCL 25 MG PO CAPS
50.0000 mg | ORAL_CAPSULE | Freq: Once | ORAL | Status: AC
  Administered 2017-04-01: 50 mg via ORAL

## 2017-04-01 MED ORDER — PROCHLORPERAZINE MALEATE 10 MG PO TABS
10.0000 mg | ORAL_TABLET | Freq: Once | ORAL | Status: AC
  Administered 2017-04-01: 10 mg via ORAL

## 2017-04-01 MED ORDER — METHYLPREDNISOLONE SODIUM SUCC 125 MG IJ SOLR
125.0000 mg | Freq: Once | INTRAMUSCULAR | Status: AC
  Administered 2017-04-01: 125 mg via INTRAVENOUS

## 2017-04-01 MED ORDER — DIPHENHYDRAMINE HCL 25 MG PO CAPS
ORAL_CAPSULE | ORAL | Status: AC
  Filled 2017-04-01: qty 2

## 2017-04-01 MED ORDER — SODIUM CHLORIDE 0.9 % IV SOLN
Freq: Once | INTRAVENOUS | Status: AC
  Administered 2017-04-01: 09:00:00 via INTRAVENOUS

## 2017-04-01 MED ORDER — SODIUM CHLORIDE 0.9% FLUSH
10.0000 mL | INTRAVENOUS | Status: DC | PRN
  Administered 2017-04-01: 10 mL
  Filled 2017-04-01: qty 10

## 2017-04-01 MED ORDER — ACETAMINOPHEN 325 MG PO TABS
650.0000 mg | ORAL_TABLET | Freq: Once | ORAL | Status: AC
  Administered 2017-04-01: 650 mg via ORAL

## 2017-04-01 MED ORDER — HEPARIN SOD (PORK) LOCK FLUSH 100 UNIT/ML IV SOLN
500.0000 [IU] | Freq: Once | INTRAVENOUS | Status: AC | PRN
  Administered 2017-04-01: 500 [IU]
  Filled 2017-04-01: qty 5

## 2017-04-01 MED ORDER — SODIUM CHLORIDE 0.9 % IV SOLN
1600.0000 mg | Freq: Once | INTRAVENOUS | Status: AC
  Administered 2017-04-01: 1600 mg via INTRAVENOUS
  Filled 2017-04-01: qty 80

## 2017-04-01 MED ORDER — ACETAMINOPHEN 325 MG PO TABS
ORAL_TABLET | ORAL | Status: AC
  Filled 2017-04-01: qty 2

## 2017-04-01 MED ORDER — PROCHLORPERAZINE MALEATE 10 MG PO TABS
ORAL_TABLET | ORAL | Status: AC
  Filled 2017-04-01: qty 1

## 2017-04-01 MED ORDER — METHYLPREDNISOLONE SODIUM SUCC 125 MG IJ SOLR
INTRAMUSCULAR | Status: AC
  Filled 2017-04-01: qty 2

## 2017-04-01 MED ORDER — DEXAMETHASONE 2 MG PO TABS: ORAL_TABLET | ORAL | 1 refills | Status: DC

## 2017-04-01 NOTE — Patient Instructions (Signed)
Implanted Port Insertion, Care After °This sheet gives you information about how to care for yourself after your procedure. Your health care provider may also give you more specific instructions. If you have problems or questions, contact your health care provider. °What can I expect after the procedure? °After your procedure, it is common to have: °· Discomfort at the port insertion site. °· Bruising on the skin over the port. This should improve over 3-4 days. ° °Follow these instructions at home: °Port care °· After your port is placed, you will get a manufacturer's information card. The card has information about your port. Keep this card with you at all times. °· Take care of the port as told by your health care provider. Ask your health care provider if you or a family member can get training for taking care of the port at home. A home health care nurse may also take care of the port. °· Make sure to remember what type of port you have. °Incision care °· Follow instructions from your health care provider about how to take care of your port insertion site. Make sure you: °? Wash your hands with soap and water before you change your bandage (dressing). If soap and water are not available, use hand sanitizer. °? Change your dressing as told by your health care provider. °? Leave stitches (sutures), skin glue, or adhesive strips in place. These skin closures may need to stay in place for 2 weeks or longer. If adhesive strip edges start to loosen and curl up, you may trim the loose edges. Do not remove adhesive strips completely unless your health care provider tells you to do that. °· Check your port insertion site every day for signs of infection. Check for: °? More redness, swelling, or pain. °? More fluid or blood. °? Warmth. °? Pus or a bad smell. °General instructions °· Do not take baths, swim, or use a hot tub until your health care provider approves. °· Do not lift anything that is heavier than 10 lb (4.5  kg) for a week, or as told by your health care provider. °· Ask your health care provider when it is okay to: °? Return to work or school. °? Resume usual physical activities or sports. °· Do not drive for 24 hours if you were given a medicine to help you relax (sedative). °· Take over-the-counter and prescription medicines only as told by your health care provider. °· Wear a medical alert bracelet in case of an emergency. This will tell any health care providers that you have a port. °· Keep all follow-up visits as told by your health care provider. This is important. °Contact a health care provider if: °· You cannot flush your port with saline as directed, or you cannot draw blood from the port. °· You have a fever or chills. °· You have more redness, swelling, or pain around your port insertion site. °· You have more fluid or blood coming from your port insertion site. °· Your port insertion site feels warm to the touch. °· You have pus or a bad smell coming from the port insertion site. °Get help right away if: °· You have chest pain or shortness of breath. °· You have bleeding from your port that you cannot control. °Summary °· Take care of the port as told by your health care provider. °· Change your dressing as told by your health care provider. °· Keep all follow-up visits as told by your health care provider. °  This information is not intended to replace advice given to you by your health care provider. Make sure you discuss any questions you have with your health care provider. °Document Released: 10/29/2012 Document Revised: 11/30/2015 Document Reviewed: 11/30/2015 °Elsevier Interactive Patient Education © 2017 Elsevier Inc. ° °

## 2017-04-01 NOTE — Patient Instructions (Signed)
Daratumumab injection What is this medicine? DARATUMUMAB (dar a toom ue mab) is a monoclonal antibody. It is used to treat multiple myeloma. This medicine may be used for other purposes; ask your health care provider or pharmacist if you have questions. COMMON BRAND NAME(S): DARZALEX What should I tell my health care provider before I take this medicine? They need to know if you have any of these conditions: -infection (especially a virus infection such as chickenpox, cold sores, or herpes) -lung or breathing disease -pregnant or trying to get pregnant -breast-feeding -an unusual or allergic reaction to daratumumab, other medicines, foods, dyes, or preservatives How should I use this medicine? This medicine is for infusion into a vein. It is given by a health care professional in a hospital or clinic setting. Talk to your pediatrician regarding the use of this medicine in children. Special care may be needed. Overdosage: If you think you have taken too much of this medicine contact a poison control center or emergency room at once. NOTE: This medicine is only for you. Do not share this medicine with others. What if I miss a dose? Keep appointments for follow-up doses as directed. It is important not to miss your dose. Call your doctor or health care professional if you are unable to keep an appointment. What may interact with this medicine? Interactions have not been studied. Give your health care provider a list of all the medicines, herbs, non-prescription drugs, or dietary supplements you use. Also tell them if you smoke, drink alcohol, or use illegal drugs. Some items may interact with your medicine. This list may not describe all possible interactions. Give your health care provider a list of all the medicines, herbs, non-prescription drugs, or dietary supplements you use. Also tell them if you smoke, drink alcohol, or use illegal drugs. Some items may interact with your medicine. What  should I watch for while using this medicine? This drug may make you feel generally unwell. Report any side effects. Continue your course of treatment even though you feel ill unless your doctor tells you to stop. This medicine can cause serious allergic reactions. To reduce your risk you may need to take medicine before treatment with this medicine. Take your medicine as directed. This medicine can affect the results of blood tests to match your blood type. These changes can last for up to 6 months after the final dose. Your healthcare provider will do blood tests to match your blood type before you start treatment. Tell all of your healthcare providers that you are being treated with this medicine before receiving a blood transfusion. This medicine can affect the results of some tests used to determine treatment response; extra tests may be needed to evaluate response. Do not become pregnant while taking this medicine or for 3 months after stopping it. Women should inform their doctor if they wish to become pregnant or think they might be pregnant. There is a potential for serious side effects to an unborn child. Talk to your health care professional or pharmacist for more information. What side effects may I notice from receiving this medicine? Side effects that you should report to your doctor or health care professional as soon as possible: -allergic reactions like skin rash, itching or hives, swelling of the face, lips, or tongue -breathing problems -chills -cough -dizziness -feeling faint or lightheaded -headache -low blood counts - this medicine may decrease the number of white blood cells, red blood cells and platelets. You may be at increased risk  for infections and bleeding. -nausea, vomiting -shortness of breath -signs of decreased platelets or bleeding - bruising, pinpoint red spots on the skin, black, tarry stools, blood in the urine -signs of decreased red blood cells - unusually  weak or tired, feeling faint or lightheaded, falls -signs of infection - fever or chills, cough, sore throat, pain or difficulty passing urine Side effects that usually do not require medical attention (report to your doctor or health care professional if they continue or are bothersome): -back pain -diarrhea -muscle cramps -pain, tingling, numbness in the hands or feet -swelling of the ankles, feet, hands -tiredness This list may not describe all possible side effects. Call your doctor for medical advice about side effects. You may report side effects to FDA at 1-800-FDA-1088. Where should I keep my medicine? Keep out of the reach of children. This drug is given in a hospital or clinic and will not be stored at home. NOTE: This sheet is a summary. It may not cover all possible information. If you have questions about this medicine, talk to your doctor, pharmacist, or health care provider.  2018 Elsevier/Gold Standard (2015-02-10 10:38:11)  

## 2017-04-02 DIAGNOSIS — M25512 Pain in left shoulder: Secondary | ICD-10-CM | POA: Diagnosis not present

## 2017-04-03 DIAGNOSIS — M75122 Complete rotator cuff tear or rupture of left shoulder, not specified as traumatic: Secondary | ICD-10-CM | POA: Diagnosis not present

## 2017-04-03 DIAGNOSIS — M25612 Stiffness of left shoulder, not elsewhere classified: Secondary | ICD-10-CM | POA: Diagnosis not present

## 2017-04-03 DIAGNOSIS — M25512 Pain in left shoulder: Secondary | ICD-10-CM | POA: Diagnosis not present

## 2017-04-03 DIAGNOSIS — M6281 Muscle weakness (generalized): Secondary | ICD-10-CM | POA: Diagnosis not present

## 2017-04-08 DIAGNOSIS — M75122 Complete rotator cuff tear or rupture of left shoulder, not specified as traumatic: Secondary | ICD-10-CM | POA: Diagnosis not present

## 2017-04-08 DIAGNOSIS — M25512 Pain in left shoulder: Secondary | ICD-10-CM | POA: Diagnosis not present

## 2017-04-08 DIAGNOSIS — M6281 Muscle weakness (generalized): Secondary | ICD-10-CM | POA: Diagnosis not present

## 2017-04-08 DIAGNOSIS — M25612 Stiffness of left shoulder, not elsewhere classified: Secondary | ICD-10-CM | POA: Diagnosis not present

## 2017-04-10 DIAGNOSIS — M6281 Muscle weakness (generalized): Secondary | ICD-10-CM | POA: Diagnosis not present

## 2017-04-10 DIAGNOSIS — M75122 Complete rotator cuff tear or rupture of left shoulder, not specified as traumatic: Secondary | ICD-10-CM | POA: Diagnosis not present

## 2017-04-10 DIAGNOSIS — M25612 Stiffness of left shoulder, not elsewhere classified: Secondary | ICD-10-CM | POA: Diagnosis not present

## 2017-04-10 DIAGNOSIS — M25512 Pain in left shoulder: Secondary | ICD-10-CM | POA: Diagnosis not present

## 2017-04-15 ENCOUNTER — Inpatient Hospital Stay: Payer: PPO

## 2017-04-15 ENCOUNTER — Inpatient Hospital Stay (HOSPITAL_BASED_OUTPATIENT_CLINIC_OR_DEPARTMENT_OTHER): Payer: PPO | Admitting: Hematology & Oncology

## 2017-04-15 VITALS — BP 116/61 | HR 61 | Resp 14

## 2017-04-15 VITALS — BP 133/81 | HR 74 | Resp 19 | Wt 221.0 lb

## 2017-04-15 DIAGNOSIS — C9000 Multiple myeloma not having achieved remission: Secondary | ICD-10-CM

## 2017-04-15 DIAGNOSIS — M25512 Pain in left shoulder: Secondary | ICD-10-CM

## 2017-04-15 DIAGNOSIS — Z79899 Other long term (current) drug therapy: Secondary | ICD-10-CM | POA: Diagnosis not present

## 2017-04-15 DIAGNOSIS — Z5111 Encounter for antineoplastic chemotherapy: Secondary | ICD-10-CM | POA: Diagnosis not present

## 2017-04-15 LAB — CMP (CANCER CENTER ONLY)
ALT: 30 U/L (ref 10–47)
ANION GAP: 5 (ref 5–15)
AST: 24 U/L (ref 11–38)
Albumin: 3.7 g/dL (ref 3.5–5.0)
Alkaline Phosphatase: 44 U/L (ref 26–84)
BILIRUBIN TOTAL: 0.5 mg/dL (ref 0.2–1.6)
BUN: 16 mg/dL (ref 7–22)
CALCIUM: 9.7 mg/dL (ref 8.0–10.3)
CO2: 30 mmol/L (ref 18–33)
Chloride: 108 mmol/L (ref 98–108)
Creatinine: 0.7 mg/dL (ref 0.60–1.20)
GLUCOSE: 107 mg/dL (ref 73–118)
Potassium: 4 mmol/L (ref 3.3–4.7)
Sodium: 143 mmol/L (ref 128–145)
TOTAL PROTEIN: 6.8 g/dL (ref 6.4–8.1)

## 2017-04-15 LAB — CBC WITH DIFFERENTIAL (CANCER CENTER ONLY)
Basophils Absolute: 0 10*3/uL (ref 0.0–0.1)
Basophils Relative: 1 %
Eosinophils Absolute: 0.1 10*3/uL (ref 0.0–0.5)
Eosinophils Relative: 2 %
HEMATOCRIT: 38.8 % (ref 38.7–49.9)
Hemoglobin: 12.4 g/dL — ABNORMAL LOW (ref 13.0–17.1)
LYMPHS PCT: 33 %
Lymphs Abs: 1.3 10*3/uL (ref 0.9–3.3)
MCH: 32.8 pg (ref 28.0–33.4)
MCHC: 32 g/dL (ref 32.0–35.9)
MCV: 102.6 fL — AB (ref 82.0–98.0)
Monocytes Absolute: 0.6 10*3/uL (ref 0.1–0.9)
Monocytes Relative: 16 %
NEUTROS ABS: 1.9 10*3/uL (ref 1.5–6.5)
Neutrophils Relative %: 48 %
Platelet Count: 162 10*3/uL (ref 145–400)
RBC: 3.78 MIL/uL — AB (ref 4.20–5.70)
RDW: 14.6 % (ref 11.1–15.7)
WBC: 3.9 10*3/uL — AB (ref 4.0–10.0)

## 2017-04-15 MED ORDER — METHYLPREDNISOLONE SODIUM SUCC 125 MG IJ SOLR
125.0000 mg | Freq: Once | INTRAMUSCULAR | Status: AC
  Administered 2017-04-15: 125 mg via INTRAVENOUS

## 2017-04-15 MED ORDER — HEPARIN SOD (PORK) LOCK FLUSH 100 UNIT/ML IV SOLN
500.0000 [IU] | Freq: Once | INTRAVENOUS | Status: AC | PRN
  Administered 2017-04-15: 500 [IU]
  Filled 2017-04-15: qty 5

## 2017-04-15 MED ORDER — ACETAMINOPHEN 325 MG PO TABS
650.0000 mg | ORAL_TABLET | Freq: Once | ORAL | Status: AC
  Administered 2017-04-15: 650 mg via ORAL

## 2017-04-15 MED ORDER — DIPHENHYDRAMINE HCL 25 MG PO CAPS
50.0000 mg | ORAL_CAPSULE | Freq: Once | ORAL | Status: AC
Start: 2017-04-15 — End: 2017-04-15
  Administered 2017-04-15: 50 mg via ORAL

## 2017-04-15 MED ORDER — PROCHLORPERAZINE MALEATE 10 MG PO TABS
ORAL_TABLET | ORAL | Status: AC
  Filled 2017-04-15: qty 1

## 2017-04-15 MED ORDER — DIPHENHYDRAMINE HCL 25 MG PO CAPS
ORAL_CAPSULE | ORAL | Status: AC
  Filled 2017-04-15: qty 2

## 2017-04-15 MED ORDER — PROCHLORPERAZINE MALEATE 10 MG PO TABS
10.0000 mg | ORAL_TABLET | Freq: Once | ORAL | Status: AC
  Administered 2017-04-15: 10 mg via ORAL

## 2017-04-15 MED ORDER — SODIUM CHLORIDE 0.9 % IV SOLN
Freq: Once | INTRAVENOUS | Status: AC
  Administered 2017-04-15: 09:00:00 via INTRAVENOUS

## 2017-04-15 MED ORDER — ACETAMINOPHEN 325 MG PO TABS
ORAL_TABLET | ORAL | Status: AC
  Filled 2017-04-15: qty 2

## 2017-04-15 MED ORDER — METHYLPREDNISOLONE SODIUM SUCC 125 MG IJ SOLR
INTRAMUSCULAR | Status: AC
  Filled 2017-04-15: qty 2

## 2017-04-15 MED ORDER — SODIUM CHLORIDE 0.9% FLUSH
10.0000 mL | INTRAVENOUS | Status: DC | PRN
  Administered 2017-04-15: 10 mL
  Filled 2017-04-15: qty 10

## 2017-04-15 MED ORDER — SODIUM CHLORIDE 0.9 % IV SOLN
15.7000 mg/kg | Freq: Once | INTRAVENOUS | Status: AC
  Administered 2017-04-15: 1600 mg via INTRAVENOUS
  Filled 2017-04-15: qty 80

## 2017-04-15 NOTE — Patient Instructions (Signed)
Daratumumab injection What is this medicine? DARATUMUMAB (dar a toom ue mab) is a monoclonal antibody. It is used to treat multiple myeloma. This medicine may be used for other purposes; ask your health care provider or pharmacist if you have questions. COMMON BRAND NAME(S): DARZALEX What should I tell my health care provider before I take this medicine? They need to know if you have any of these conditions: -infection (especially a virus infection such as chickenpox, cold sores, or herpes) -lung or breathing disease -pregnant or trying to get pregnant -breast-feeding -an unusual or allergic reaction to daratumumab, other medicines, foods, dyes, or preservatives How should I use this medicine? This medicine is for infusion into a vein. It is given by a health care professional in a hospital or clinic setting. Talk to your pediatrician regarding the use of this medicine in children. Special care may be needed. Overdosage: If you think you have taken too much of this medicine contact a poison control center or emergency room at once. NOTE: This medicine is only for you. Do not share this medicine with others. What if I miss a dose? Keep appointments for follow-up doses as directed. It is important not to miss your dose. Call your doctor or health care professional if you are unable to keep an appointment. What may interact with this medicine? Interactions have not been studied. Give your health care provider a list of all the medicines, herbs, non-prescription drugs, or dietary supplements you use. Also tell them if you smoke, drink alcohol, or use illegal drugs. Some items may interact with your medicine. This list may not describe all possible interactions. Give your health care provider a list of all the medicines, herbs, non-prescription drugs, or dietary supplements you use. Also tell them if you smoke, drink alcohol, or use illegal drugs. Some items may interact with your medicine. What  should I watch for while using this medicine? This drug may make you feel generally unwell. Report any side effects. Continue your course of treatment even though you feel ill unless your doctor tells you to stop. This medicine can cause serious allergic reactions. To reduce your risk you may need to take medicine before treatment with this medicine. Take your medicine as directed. This medicine can affect the results of blood tests to match your blood type. These changes can last for up to 6 months after the final dose. Your healthcare provider will do blood tests to match your blood type before you start treatment. Tell all of your healthcare providers that you are being treated with this medicine before receiving a blood transfusion. This medicine can affect the results of some tests used to determine treatment response; extra tests may be needed to evaluate response. Do not become pregnant while taking this medicine or for 3 months after stopping it. Women should inform their doctor if they wish to become pregnant or think they might be pregnant. There is a potential for serious side effects to an unborn child. Talk to your health care professional or pharmacist for more information. What side effects may I notice from receiving this medicine? Side effects that you should report to your doctor or health care professional as soon as possible: -allergic reactions like skin rash, itching or hives, swelling of the face, lips, or tongue -breathing problems -chills -cough -dizziness -feeling faint or lightheaded -headache -low blood counts - this medicine may decrease the number of white blood cells, red blood cells and platelets. You may be at increased risk  for infections and bleeding. -nausea, vomiting -shortness of breath -signs of decreased platelets or bleeding - bruising, pinpoint red spots on the skin, black, tarry stools, blood in the urine -signs of decreased red blood cells - unusually  weak or tired, feeling faint or lightheaded, falls -signs of infection - fever or chills, cough, sore throat, pain or difficulty passing urine Side effects that usually do not require medical attention (report to your doctor or health care professional if they continue or are bothersome): -back pain -diarrhea -muscle cramps -pain, tingling, numbness in the hands or feet -swelling of the ankles, feet, hands -tiredness This list may not describe all possible side effects. Call your doctor for medical advice about side effects. You may report side effects to FDA at 1-800-FDA-1088. Where should I keep my medicine? Keep out of the reach of children. This drug is given in a hospital or clinic and will not be stored at home. NOTE: This sheet is a summary. It may not cover all possible information. If you have questions about this medicine, talk to your doctor, pharmacist, or health care provider.  2018 Elsevier/Gold Standard (2015-02-10 10:38:11)  

## 2017-04-15 NOTE — Progress Notes (Signed)
Hematology and Oncology Follow Up Visit  Gene Hill [id number] 06-Feb-1940 77 y.o. 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 is back for followup.  He is making some nice improvement with his left shoulder.  He had surgery for this back in January.  He is still doing some physical therapy.  He does have some more range of motion.  He is done incredibly well with Daratumumab by itself.  His last myeloma studies back in February showed an M spike of 0.2 g/dL.  His IgA level was 4 is 32 mg/dL.  His Kappa Lightchain was 1.1 mg/dL.  He is trying to stay active.  He is trying to watch his weight.  He has had no nausea or vomiting.  He has had no problems with bowels or bladder.  He has had no leg swelling.  He has had no rashes.  Overall, his performance status is ECOG 1.  Medications:  Current Outpatient Medications:  .  aspirin 325 MG tablet, Take 325 mg by mouth daily., Disp: , Rfl:  .  cyclobenzaprine (FLEXERIL) 10 MG tablet, Take 1 tablet (10 mg total) by mouth at bedtime as needed for muscle spasms., Disp: 90 tablet, Rfl: 0 .  Daratumumab (DARZALEX IV), Inject into the vein every 14 (fourteen) days., Disp: , Rfl:  .  dexamethasone (DECADRON) 2 MG tablet, Take 4mg  day after chemo then 2mg  daily x 4, Disp: 60 tablet, Rfl: 1 .  lidocaine (XYLOCAINE) 5 % ointment, Apply 1 application topically 2 (two) times daily as needed. Mix w/ OTC Hydrocortisone 1 %, Disp: 35.44 g, Rfl: 1 .  metoprolol tartrate (LOPRESSOR) 100 MG tablet, Take 0.5 tablets (50 mg total) by mouth 2 (two) times daily., Disp: 90 tablet, Rfl: 1 .  Multiple Vitamin (MULTIVITAMIN) tablet, Take 1 tablet by mouth daily., Disp: , Rfl:  .  Omega-3 Fatty Acids  (FISH OIL) 1200 MG CAPS, Take 1,200 mg by mouth daily., Disp: , Rfl:  .  oxyCODONE-acetaminophen (PERCOCET/ROXICET) 5-325 MG tablet, TK 1 TO 2 TS PO Q 4 TO 6 H PRN P, Disp: , Rfl: 0 .  pantoprazole (PROTONIX) 40 MG tablet, Take 1 tablet (40 mg total) by mouth daily., Disp: 90 tablet, Rfl: 1 .  Probiotic Product (PROBIOTIC PO), Take 1 tablet by mouth daily. , Disp: , Rfl:  .  traMADol (ULTRAM) 50 MG tablet, Take 0.5-1 tablets (25-50 mg total) by mouth 2 (two) times daily as needed., Disp: 120 tablet, Rfl: 0 No current facility-administered medications for this visit.   Facility-Administered Medications Ordered in Other Visits:  .  daratumumab (DARZALEX) 1,600 mg in sodium chloride 0.9 % 420 mL (3.2 mg/mL) chemo infusion, 15.7 mg/kg (Treatment Plan Recorded), Intravenous, Once, Ousman Dise R, MD .  sodium chloride flush (NS) 0.9 % injection 10 mL, 10 mL, Intracatheter, PRN, [name], MD, 10 mL at 12/18/16 1250 .  sodium chloride flush (NS) 0.9 % injection 10 mL, 10 mL, Intracatheter, PRN, [name], MD, 10 mL at 04/15/17 1113  Allergies: No Known Allergies  Past Medical History, Surgical history, Social history, and Family History were  reviewed and updated.  Review of Systems:as stated in the interim history  Phyical Exam:  weight is 221 lb (100.2 kg). His blood pressure is 133/81 and his pulse is 74. His respiration is 19 and oxygen saturation is 96%.    Physical Exam  Constitutional: He is oriented to person, place, and time.  HENT:  Head: Normocephalic and atraumatic.  Mouth/Throat: Oropharynx is clear and moist.  Eyes: Pupils are equal, round, and reactive to light. EOM are normal.  Neck: Normal range of motion.  Cardiovascular: Normal rate, regular rhythm and normal heart sounds.  Pulmonary/Chest: Effort normal and breath sounds normal.  Abdominal: Soft. Bowel sounds are normal.  Musculoskeletal: Normal range of motion. He exhibits no edema, tenderness or  deformity.  He has limited range of motion of the left shoulder.  There is some tenderness to palpation over the anterior portion of the left shoulder.  Lymphadenopathy:    He has no cervical adenopathy.  Neurological: He is alert and oriented to person, place, and time.  Skin: Skin is warm and dry. No rash noted. No erythema.  Psychiatric: He has a normal mood and affect. His behavior is normal. Judgment and thought content normal.  Vitals reviewed.    Lab Results  Component Value Date   WBC 3.9 (L) 04/15/2017   HGB 12.4 (L) 01/21/2017   HCT 38.8 04/15/2017   MCV 102.6 (H) 04/15/2017   PLT 162 04/15/2017     Chemistry      Component Value Date/Time   NA 143 04/15/2017 0810   NA 141 01/21/2017 0741   NA 139 06/14/2016 1246   K 4.0 04/15/2017 0810   K 4.0 01/21/2017 0741   K 3.8 06/14/2016 1246   CL 108 04/15/2017 0810   CL 103 01/21/2017 0741   CO2 30 04/15/2017 0810   CO2 30 01/21/2017 0741   CO2 24 06/14/2016 1246   BUN 16 04/15/2017 0810   BUN 20 01/21/2017 0741   BUN 15.0 06/14/2016 1246   CREATININE 0.70 04/15/2017 0810   CREATININE 1.0 01/21/2017 0741   CREATININE 0.9 06/14/2016 1246      Component Value Date/Time   CALCIUM 9.7 04/15/2017 0810   CALCIUM 9.0 01/21/2017 0741   CALCIUM 9.6 06/14/2016 1246   ALKPHOS 44 04/15/2017 0810   ALKPHOS 37 01/21/2017 0741   ALKPHOS 54 06/14/2016 1246   AST 24 04/15/2017 0810   AST 25 06/14/2016 1246   ALT 30 04/15/2017 0810   ALT 29 01/21/2017 0741   ALT 21 06/14/2016 1246   BILITOT 0.5 04/15/2017 0810   BILITOT 0.37 06/14/2016 1246       Impression and Plan: Gene Hill is a 77 year old gentleman with history of IgA kappa myeloma. 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 He was treated with standard therapy and got into remission. 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  I would like to think that if we can improve on his myeloma level, we might be able to get him out every  month for therapy.  I know that he will start monthly treatment in May.  We have not given him Xgeva for a while.  Hopefully we can now get him back on Xgeva.  We will plan to get him back in another 2 weeks.   [name], MD 3/25/20196:20 PM

## 2017-04-15 NOTE — Patient Instructions (Signed)

## 2017-04-16 LAB — IGG, IGA, IGM
IGA: 424 mg/dL (ref 61–437)
IGM (IMMUNOGLOBULIN M), SRM: 8 mg/dL — AB (ref 15–143)
IgG (Immunoglobin G), Serum: 313 mg/dL — ABNORMAL LOW (ref 700–1600)

## 2017-04-16 LAB — KAPPA/LAMBDA LIGHT CHAINS
KAPPA FREE LGHT CHN: 14.3 mg/L (ref 3.3–19.4)
KAPPA, LAMDA LIGHT CHAIN RATIO: 6.81 — AB (ref 0.26–1.65)
Lambda free light chains: 2.1 mg/L — ABNORMAL LOW (ref 5.7–26.3)

## 2017-04-17 DIAGNOSIS — M75122 Complete rotator cuff tear or rupture of left shoulder, not specified as traumatic: Secondary | ICD-10-CM | POA: Diagnosis not present

## 2017-04-17 DIAGNOSIS — M25612 Stiffness of left shoulder, not elsewhere classified: Secondary | ICD-10-CM | POA: Diagnosis not present

## 2017-04-17 DIAGNOSIS — M6281 Muscle weakness (generalized): Secondary | ICD-10-CM | POA: Diagnosis not present

## 2017-04-17 DIAGNOSIS — M25512 Pain in left shoulder: Secondary | ICD-10-CM | POA: Diagnosis not present

## 2017-04-18 ENCOUNTER — Ambulatory Visit: Payer: PPO | Admitting: Internal Medicine

## 2017-04-18 LAB — PROTEIN ELECTROPHORESIS, SERUM, WITH REFLEX
A/G RATIO SPE: 1.4 (ref 0.7–1.7)
ALBUMIN ELP: 3.6 g/dL (ref 2.9–4.4)
Alpha-1-Globulin: 0.2 g/dL (ref 0.0–0.4)
Alpha-2-Globulin: 1 g/dL (ref 0.4–1.0)
BETA GLOBULIN: 0.9 g/dL (ref 0.7–1.3)
GAMMA GLOBULIN: 0.5 g/dL (ref 0.4–1.8)
Globulin, Total: 2.6 g/dL (ref 2.2–3.9)
M-SPIKE, %: 0.2 g/dL — AB
SPEP Interpretation: 0
TOTAL PROTEIN ELP: 6.2 g/dL (ref 6.0–8.5)

## 2017-04-19 ENCOUNTER — Telehealth: Payer: Self-pay | Admitting: *Deleted

## 2017-04-19 NOTE — Telephone Encounter (Addendum)
Patient is aware of results. He is aware of schedule remaining the same.   ----- Message from [name], MD sent at 04/18/2017  5:01 PM EDT ----- Call - the myeloma protein is still 0.2.  For now, I would not move him to every 3 week treatments!!  Laurey Arrow

## 2017-04-22 DIAGNOSIS — M25512 Pain in left shoulder: Secondary | ICD-10-CM | POA: Diagnosis not present

## 2017-04-22 DIAGNOSIS — M6281 Muscle weakness (generalized): Secondary | ICD-10-CM | POA: Diagnosis not present

## 2017-04-22 DIAGNOSIS — M75122 Complete rotator cuff tear or rupture of left shoulder, not specified as traumatic: Secondary | ICD-10-CM | POA: Diagnosis not present

## 2017-04-22 DIAGNOSIS — M25612 Stiffness of left shoulder, not elsewhere classified: Secondary | ICD-10-CM | POA: Diagnosis not present

## 2017-04-24 DIAGNOSIS — M6281 Muscle weakness (generalized): Secondary | ICD-10-CM | POA: Diagnosis not present

## 2017-04-24 DIAGNOSIS — M75122 Complete rotator cuff tear or rupture of left shoulder, not specified as traumatic: Secondary | ICD-10-CM | POA: Diagnosis not present

## 2017-04-24 DIAGNOSIS — M25512 Pain in left shoulder: Secondary | ICD-10-CM | POA: Diagnosis not present

## 2017-04-24 DIAGNOSIS — M25612 Stiffness of left shoulder, not elsewhere classified: Secondary | ICD-10-CM | POA: Diagnosis not present

## 2017-04-29 ENCOUNTER — Inpatient Hospital Stay: Payer: PPO

## 2017-04-29 ENCOUNTER — Inpatient Hospital Stay: Payer: PPO | Attending: Hematology & Oncology | Admitting: Hematology & Oncology

## 2017-04-29 VITALS — BP 138/75 | HR 74 | Temp 97.9°F | Resp 17 | Wt 222.0 lb

## 2017-04-29 VITALS — BP 123/70 | HR 82 | Temp 98.1°F | Resp 18

## 2017-04-29 DIAGNOSIS — C9002 Multiple myeloma in relapse: Secondary | ICD-10-CM | POA: Insufficient documentation

## 2017-04-29 DIAGNOSIS — Z79899 Other long term (current) drug therapy: Secondary | ICD-10-CM | POA: Insufficient documentation

## 2017-04-29 DIAGNOSIS — Z9484 Stem cells transplant status: Secondary | ICD-10-CM | POA: Diagnosis not present

## 2017-04-29 DIAGNOSIS — C9000 Multiple myeloma not having achieved remission: Secondary | ICD-10-CM

## 2017-04-29 DIAGNOSIS — Z5111 Encounter for antineoplastic chemotherapy: Secondary | ICD-10-CM | POA: Insufficient documentation

## 2017-04-29 LAB — CBC WITH DIFFERENTIAL (CANCER CENTER ONLY)
BASOS ABS: 0 10*3/uL (ref 0.0–0.1)
BASOS PCT: 0 %
EOS ABS: 0 10*3/uL (ref 0.0–0.5)
EOS PCT: 1 %
HCT: 39.4 % (ref 38.7–49.9)
Hemoglobin: 12.7 g/dL — ABNORMAL LOW (ref 13.0–17.1)
Lymphocytes Relative: 36 %
Lymphs Abs: 1.2 10*3/uL (ref 0.9–3.3)
MCH: 32.9 pg (ref 28.0–33.4)
MCHC: 32.2 g/dL (ref 32.0–35.9)
MCV: 102.1 fL — ABNORMAL HIGH (ref 82.0–98.0)
Monocytes Absolute: 0.6 10*3/uL (ref 0.1–0.9)
Monocytes Relative: 16 %
NEUTROS PCT: 47 %
Neutro Abs: 1.6 10*3/uL (ref 1.5–6.5)
PLATELETS: 183 10*3/uL (ref 145–400)
RBC: 3.86 MIL/uL — AB (ref 4.20–5.70)
RDW: 14.8 % (ref 11.1–15.7)
WBC: 3.4 10*3/uL — AB (ref 4.0–10.0)

## 2017-04-29 LAB — CMP (CANCER CENTER ONLY)
ALBUMIN: 3.8 g/dL (ref 3.5–5.0)
ALT: 25 U/L (ref 10–47)
AST: 24 U/L (ref 11–38)
Alkaline Phosphatase: 42 U/L (ref 26–84)
Anion gap: 10 (ref 5–15)
BUN: 14 mg/dL (ref 7–22)
CO2: 28 mmol/L (ref 18–33)
CREATININE: 1.1 mg/dL (ref 0.60–1.20)
Calcium: 9.7 mg/dL (ref 8.0–10.3)
Chloride: 103 mmol/L (ref 98–108)
GLUCOSE: 113 mg/dL (ref 73–118)
POTASSIUM: 3.8 mmol/L (ref 3.3–4.7)
SODIUM: 141 mmol/L (ref 128–145)
Total Bilirubin: 0.5 mg/dL (ref 0.2–1.6)
Total Protein: 6.9 g/dL (ref 6.4–8.1)

## 2017-04-29 MED ORDER — SODIUM CHLORIDE 0.9 % IV SOLN
15.7000 mg/kg | Freq: Once | INTRAVENOUS | Status: AC
  Administered 2017-04-29: 1600 mg via INTRAVENOUS
  Filled 2017-04-29: qty 80

## 2017-04-29 MED ORDER — ACETAMINOPHEN 325 MG PO TABS
ORAL_TABLET | ORAL | Status: AC
  Filled 2017-04-29: qty 2

## 2017-04-29 MED ORDER — SODIUM CHLORIDE 0.9 % IV SOLN
Freq: Once | INTRAVENOUS | Status: AC
Start: 2017-04-29 — End: 2017-04-29
  Administered 2017-04-29: 09:00:00 via INTRAVENOUS

## 2017-04-29 MED ORDER — PROCHLORPERAZINE MALEATE 10 MG PO TABS
10.0000 mg | ORAL_TABLET | Freq: Once | ORAL | Status: AC
  Administered 2017-04-29: 10 mg via ORAL

## 2017-04-29 MED ORDER — SODIUM CHLORIDE 0.9% FLUSH
10.0000 mL | INTRAVENOUS | Status: DC | PRN
  Administered 2017-04-29: 10 mL
  Filled 2017-04-29: qty 10

## 2017-04-29 MED ORDER — METHYLPREDNISOLONE SODIUM SUCC 125 MG IJ SOLR
INTRAMUSCULAR | Status: AC
  Filled 2017-04-29: qty 2

## 2017-04-29 MED ORDER — PROCHLORPERAZINE MALEATE 10 MG PO TABS
ORAL_TABLET | ORAL | Status: AC
Start: 2017-04-29 — End: ?
  Filled 2017-04-29: qty 1

## 2017-04-29 MED ORDER — DIPHENHYDRAMINE HCL 25 MG PO CAPS
50.0000 mg | ORAL_CAPSULE | Freq: Once | ORAL | Status: AC
  Administered 2017-04-29: 50 mg via ORAL

## 2017-04-29 MED ORDER — ACETAMINOPHEN 325 MG PO TABS
650.0000 mg | ORAL_TABLET | Freq: Once | ORAL | Status: AC
Start: 2017-04-29 — End: 2017-04-29
  Administered 2017-04-29: 650 mg via ORAL

## 2017-04-29 MED ORDER — HEPARIN SOD (PORK) LOCK FLUSH 100 UNIT/ML IV SOLN
500.0000 [IU] | Freq: Once | INTRAVENOUS | Status: AC | PRN
  Administered 2017-04-29: 500 [IU]
  Filled 2017-04-29: qty 5

## 2017-04-29 MED ORDER — METHYLPREDNISOLONE SODIUM SUCC 125 MG IJ SOLR
125.0000 mg | Freq: Once | INTRAMUSCULAR | Status: AC
  Administered 2017-04-29: 125 mg via INTRAVENOUS

## 2017-04-29 MED ORDER — DIPHENHYDRAMINE HCL 25 MG PO CAPS
ORAL_CAPSULE | ORAL | Status: AC
  Filled 2017-04-29: qty 2

## 2017-04-29 NOTE — Progress Notes (Signed)
Hematology and Oncology Follow Up Visit  Gene Hill [id number] [birth year] 77 y.o. 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  He is making some nice improvement with his left shoulder.  He had surgery for this back in January.  He is still doing some physical therapy.  He does have some more range of motion.  He is done incredibly well with Daratumumab by itself.  His last myeloma studies back in March showed an M spike of 0.2 g/dL.  His IgA level was 424 mg/dL.  His Kappa Light chain was 1.4 mg/dL.  He is trying to stay active.  He is trying to watch his weight.  He has had no nausea or vomiting.  He has had no problems with bowels or bladder.  He has had no leg swelling.  He has had no rashes.  Overall, his performance status is ECOG 1.  Medications:  Current Outpatient Medications:  .  aspirin 325 MG tablet, Take 325 mg by mouth daily., Disp: , Rfl:  .  cyclobenzaprine (FLEXERIL) 10 MG tablet, Take 1 tablet (10 mg total) by mouth at bedtime as needed for muscle spasms., Disp: 90 tablet, Rfl: 0 .  Daratumumab (DARZALEX IV), Inject into the vein every 14 (fourteen) days., Disp: , Rfl:  .  dexamethasone (DECADRON) 2 MG tablet, Take 4mg  day after chemo then 2mg  daily x 4, Disp: 60 tablet, Rfl: 1 .  lidocaine (XYLOCAINE) 5 % ointment, Apply 1 application topically 2 (two) times daily as needed. Mix w/ OTC Hydrocortisone 1 %, Disp: 35.44 g, Rfl: 1 .  metoprolol tartrate (LOPRESSOR) 100 MG tablet, Take 0.5 tablets (50 mg total) by mouth 2 (two) times daily., Disp: 90 tablet, Rfl: 1 .  Multiple Vitamin (MULTIVITAMIN) tablet, Take 1 tablet by mouth daily., Disp: , Rfl:  .  Omega-3 Fatty Acids (FISH  OIL) 1200 MG CAPS, Take 1,200 mg by mouth daily., Disp: , Rfl:  .  oxyCODONE-acetaminophen (PERCOCET/ROXICET) 5-325 MG tablet, TK 1 TO 2 TS PO Q 4 TO 6 H PRN P, Disp: , Rfl: 0 .  pantoprazole (PROTONIX) 40 MG tablet, Take 1 tablet (40 mg total) by mouth daily., Disp: 90 tablet, Rfl: 1 .  Probiotic Product (PROBIOTIC PO), Take 1 tablet by mouth daily. , Disp: , Rfl:  .  traMADol (ULTRAM) 50 MG tablet, Take 0.5-1 tablets (25-50 mg total) by mouth 2 (two) times daily as needed., Disp: 120 tablet, Rfl: 0 No current facility-administered medications for this visit.   Facility-Administered Medications Ordered in Other Visits:  .  daratumumab (DARZALEX) 1,600 mg in sodium chloride 0.9 % 420 mL (3.2 mg/mL) chemo infusion, 15.7 mg/kg (Treatment Plan Recorded), Intravenous, Once, Ennever, Peter R, MD .  sodium chloride flush (NS) 0.9 % injection 10 mL, 10 mL, Intracatheter, PRN, [name], MD, 10 mL at 12/18/16 1250 .  sodium chloride flush (NS) 0.9 % injection 10 mL, 10 mL, Intracatheter, PRN, [name], MD, 10 mL at 04/15/17 1113  Allergies: No Known Allergies  Past Medical History, Surgical history, Social history, and Family History were reviewed  and updated.  Review of Systems:as stated in the interim history  Phyical Exam:  weight is 222 lb (100.7 kg). His oral temperature is 97.9 F (36.6 C). His blood pressure is 138/75 and his pulse is 74. His respiration is 17 and oxygen saturation is 96%.    Physical Exam  Constitutional: He is oriented to person, place, and time.  HENT:  Head: Normocephalic and atraumatic.  Mouth/Throat: Oropharynx is clear and moist.  Eyes: Pupils are equal, round, and reactive to light. EOM are normal.  Neck: Normal range of motion.  Cardiovascular: Normal rate, regular rhythm and normal heart sounds.  Pulmonary/Chest: Effort normal and breath sounds normal.  Abdominal: Soft. Bowel sounds are normal.  Musculoskeletal: Normal range of motion. He  exhibits no edema, tenderness or deformity.  He has limited range of motion of the left shoulder.  There is some tenderness to palpation over the anterior portion of the left shoulder.  Lymphadenopathy:    He has no cervical adenopathy.  Neurological: He is alert and oriented to person, place, and time.  Skin: Skin is warm and dry. No rash noted. No erythema.  Psychiatric: He has a normal mood and affect. His behavior is normal. Judgment and thought content normal.  Vitals reviewed.    Lab Results  Component Value Date   WBC 3.4 (L) 04/29/2017   HGB 12.4 (L) 01/21/2017   HCT 39.4 04/29/2017   MCV 102.1 (H) 04/29/2017   PLT 183 04/29/2017     Chemistry      Component Value Date/Time   NA 141 04/29/2017 0815   NA 141 01/21/2017 0741   NA 139 06/14/2016 1246   K 3.8 04/29/2017 0815   K 4.0 01/21/2017 0741   K 3.8 06/14/2016 1246   CL 103 04/29/2017 0815   CL 103 01/21/2017 0741   CO2 28 04/29/2017 0815   CO2 30 01/21/2017 0741   CO2 24 06/14/2016 1246   BUN 14 04/29/2017 0815   BUN 20 01/21/2017 0741   BUN 15.0 06/14/2016 1246   CREATININE 1.10 04/29/2017 0815   CREATININE 1.0 01/21/2017 0741   CREATININE 0.9 06/14/2016 1246      Component Value Date/Time   CALCIUM 9.7 04/29/2017 0815   CALCIUM 9.0 01/21/2017 0741   CALCIUM 9.6 06/14/2016 1246   ALKPHOS 42 04/29/2017 0815   ALKPHOS 37 01/21/2017 0741   ALKPHOS 54 06/14/2016 1246   AST 24 04/29/2017 0815   AST 25 06/14/2016 1246   ALT 25 04/29/2017 0815   ALT 29 01/21/2017 0741   ALT 21 06/14/2016 1246   BILITOT 0.5 04/29/2017 0815   BILITOT 0.37 06/14/2016 1246       Impression and Plan: Gene Hill is a 77 year old gentleman with history of IgA kappa myeloma. 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 He was treated with standard therapy and got into remission. 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  I would like to think that if we can improve on his myeloma level, we  might be able to get him out every month for therapy.  I know that he will start monthly treatment in May.  We have not given him Xgeva for a while.  Hopefully we can now get him back on Xgeva.  We will plan to get him back in another 2 weeks.   [name], MD 4/8/20199:00 AM

## 2017-04-30 DIAGNOSIS — M6281 Muscle weakness (generalized): Secondary | ICD-10-CM | POA: Diagnosis not present

## 2017-04-30 DIAGNOSIS — M25512 Pain in left shoulder: Secondary | ICD-10-CM | POA: Diagnosis not present

## 2017-04-30 DIAGNOSIS — M75122 Complete rotator cuff tear or rupture of left shoulder, not specified as traumatic: Secondary | ICD-10-CM | POA: Diagnosis not present

## 2017-04-30 DIAGNOSIS — M25612 Stiffness of left shoulder, not elsewhere classified: Secondary | ICD-10-CM | POA: Diagnosis not present

## 2017-04-30 LAB — IGG, IGA, IGM
IGA: 433 mg/dL (ref 61–437)
IGM (IMMUNOGLOBULIN M), SRM: 7 mg/dL — AB (ref 15–143)
IgG (Immunoglobin G), Serum: 302 mg/dL — ABNORMAL LOW (ref 700–1600)

## 2017-04-30 LAB — KAPPA/LAMBDA LIGHT CHAINS
KAPPA FREE LGHT CHN: 14.5 mg/L (ref 3.3–19.4)
Kappa, lambda light chain ratio: 6.3 — ABNORMAL HIGH (ref 0.26–1.65)
Lambda free light chains: 2.3 mg/L — ABNORMAL LOW (ref 5.7–26.3)

## 2017-05-02 DIAGNOSIS — M6281 Muscle weakness (generalized): Secondary | ICD-10-CM | POA: Diagnosis not present

## 2017-05-02 DIAGNOSIS — M75122 Complete rotator cuff tear or rupture of left shoulder, not specified as traumatic: Secondary | ICD-10-CM | POA: Diagnosis not present

## 2017-05-02 DIAGNOSIS — M25612 Stiffness of left shoulder, not elsewhere classified: Secondary | ICD-10-CM | POA: Diagnosis not present

## 2017-05-02 DIAGNOSIS — M25512 Pain in left shoulder: Secondary | ICD-10-CM | POA: Diagnosis not present

## 2017-05-03 LAB — PROTEIN ELECTROPHORESIS, SERUM, WITH REFLEX
A/G Ratio: 1.3 (ref 0.7–1.7)
Albumin ELP: 3.5 g/dL (ref 2.9–4.4)
Alpha-1-Globulin: 0.2 g/dL (ref 0.0–0.4)
Alpha-2-Globulin: 1 g/dL (ref 0.4–1.0)
Beta Globulin: 1 g/dL (ref 0.7–1.3)
GLOBULIN, TOTAL: 2.8 g/dL (ref 2.2–3.9)
Gamma Globulin: 0.6 g/dL (ref 0.4–1.8)
M-SPIKE, %: 0.3 g/dL — AB
SPEP Interpretation: 0
TOTAL PROTEIN ELP: 6.3 g/dL (ref 6.0–8.5)

## 2017-05-03 LAB — IMMUNOFIXATION REFLEX, SERUM
IGA: 431 mg/dL (ref 61–437)
IGG (IMMUNOGLOBIN G), SERUM: 303 mg/dL — AB (ref 700–1600)
IGM (IMMUNOGLOBULIN M), SRM: 8 mg/dL — AB (ref 15–143)

## 2017-05-06 DIAGNOSIS — M75122 Complete rotator cuff tear or rupture of left shoulder, not specified as traumatic: Secondary | ICD-10-CM | POA: Diagnosis not present

## 2017-05-06 DIAGNOSIS — M25512 Pain in left shoulder: Secondary | ICD-10-CM | POA: Diagnosis not present

## 2017-05-06 DIAGNOSIS — M25612 Stiffness of left shoulder, not elsewhere classified: Secondary | ICD-10-CM | POA: Diagnosis not present

## 2017-05-06 DIAGNOSIS — M6281 Muscle weakness (generalized): Secondary | ICD-10-CM | POA: Diagnosis not present

## 2017-05-08 DIAGNOSIS — M6281 Muscle weakness (generalized): Secondary | ICD-10-CM | POA: Diagnosis not present

## 2017-05-08 DIAGNOSIS — M25512 Pain in left shoulder: Secondary | ICD-10-CM | POA: Diagnosis not present

## 2017-05-08 DIAGNOSIS — M25612 Stiffness of left shoulder, not elsewhere classified: Secondary | ICD-10-CM | POA: Diagnosis not present

## 2017-05-08 DIAGNOSIS — M75122 Complete rotator cuff tear or rupture of left shoulder, not specified as traumatic: Secondary | ICD-10-CM | POA: Diagnosis not present

## 2017-05-13 ENCOUNTER — Inpatient Hospital Stay: Payer: PPO

## 2017-05-13 ENCOUNTER — Encounter: Payer: Self-pay | Admitting: Family

## 2017-05-13 ENCOUNTER — Other Ambulatory Visit: Payer: Self-pay

## 2017-05-13 ENCOUNTER — Inpatient Hospital Stay (HOSPITAL_BASED_OUTPATIENT_CLINIC_OR_DEPARTMENT_OTHER): Payer: PPO | Admitting: Family

## 2017-05-13 VITALS — BP 140/81 | HR 98 | Temp 98.0°F | Resp 17

## 2017-05-13 VITALS — BP 139/79 | HR 72 | Temp 98.0°F | Wt 226.0 lb

## 2017-05-13 DIAGNOSIS — E349 Endocrine disorder, unspecified: Secondary | ICD-10-CM

## 2017-05-13 DIAGNOSIS — Z79899 Other long term (current) drug therapy: Secondary | ICD-10-CM | POA: Diagnosis not present

## 2017-05-13 DIAGNOSIS — M25512 Pain in left shoulder: Secondary | ICD-10-CM | POA: Diagnosis not present

## 2017-05-13 DIAGNOSIS — C9000 Multiple myeloma not having achieved remission: Secondary | ICD-10-CM

## 2017-05-13 DIAGNOSIS — M75122 Complete rotator cuff tear or rupture of left shoulder, not specified as traumatic: Secondary | ICD-10-CM | POA: Diagnosis not present

## 2017-05-13 DIAGNOSIS — C9002 Multiple myeloma in relapse: Secondary | ICD-10-CM

## 2017-05-13 DIAGNOSIS — Z9484 Stem cells transplant status: Secondary | ICD-10-CM

## 2017-05-13 DIAGNOSIS — Z5111 Encounter for antineoplastic chemotherapy: Secondary | ICD-10-CM | POA: Diagnosis not present

## 2017-05-13 DIAGNOSIS — M6281 Muscle weakness (generalized): Secondary | ICD-10-CM | POA: Diagnosis not present

## 2017-05-13 DIAGNOSIS — M25612 Stiffness of left shoulder, not elsewhere classified: Secondary | ICD-10-CM | POA: Diagnosis not present

## 2017-05-13 LAB — CBC WITH DIFFERENTIAL (CANCER CENTER ONLY)
Basophils Absolute: 0 10*3/uL (ref 0.0–0.1)
Basophils Relative: 1 %
EOS ABS: 0 10*3/uL (ref 0.0–0.5)
EOS PCT: 1 %
HCT: 39 % (ref 38.7–49.9)
HEMOGLOBIN: 12.5 g/dL — AB (ref 13.0–17.1)
LYMPHS ABS: 1.5 10*3/uL (ref 0.9–3.3)
Lymphocytes Relative: 40 %
MCH: 32.7 pg (ref 28.0–33.4)
MCHC: 32.1 g/dL (ref 32.0–35.9)
MCV: 102.1 fL — ABNORMAL HIGH (ref 82.0–98.0)
MONO ABS: 0.6 10*3/uL (ref 0.1–0.9)
MONOS PCT: 15 %
Neutro Abs: 1.6 10*3/uL (ref 1.5–6.5)
Neutrophils Relative %: 43 %
PLATELETS: 161 10*3/uL (ref 145–400)
RBC: 3.82 MIL/uL — ABNORMAL LOW (ref 4.20–5.70)
RDW: 14.7 % (ref 11.1–15.7)
WBC Count: 3.8 10*3/uL — ABNORMAL LOW (ref 4.0–10.0)

## 2017-05-13 LAB — CMP (CANCER CENTER ONLY)
ALK PHOS: 39 U/L (ref 26–84)
ALT: 30 U/L (ref 10–47)
AST: 22 U/L (ref 11–38)
Albumin: 3.7 g/dL (ref 3.5–5.0)
Anion gap: 6 (ref 5–15)
BUN: 13 mg/dL (ref 7–22)
CHLORIDE: 107 mmol/L (ref 98–108)
CO2: 28 mmol/L (ref 18–33)
Calcium: 9.3 mg/dL (ref 8.0–10.3)
Creatinine: 1 mg/dL (ref 0.60–1.20)
Glucose, Bld: 109 mg/dL (ref 73–118)
POTASSIUM: 3.7 mmol/L (ref 3.3–4.7)
SODIUM: 141 mmol/L (ref 128–145)
Total Bilirubin: 0.6 mg/dL (ref 0.2–1.6)
Total Protein: 6.5 g/dL (ref 6.4–8.1)

## 2017-05-13 LAB — LACTATE DEHYDROGENASE: LDH: 192 U/L (ref 125–245)

## 2017-05-13 MED ORDER — ACETAMINOPHEN 325 MG PO TABS
650.0000 mg | ORAL_TABLET | Freq: Once | ORAL | Status: AC
  Administered 2017-05-13: 650 mg via ORAL

## 2017-05-13 MED ORDER — METHYLPREDNISOLONE SODIUM SUCC 125 MG IJ SOLR
INTRAMUSCULAR | Status: AC
  Filled 2017-05-13: qty 2

## 2017-05-13 MED ORDER — HEPARIN SOD (PORK) LOCK FLUSH 100 UNIT/ML IV SOLN
500.0000 [IU] | Freq: Once | INTRAVENOUS | Status: AC | PRN
  Administered 2017-05-13: 500 [IU]
  Filled 2017-05-13: qty 5

## 2017-05-13 MED ORDER — DIPHENHYDRAMINE HCL 25 MG PO CAPS
ORAL_CAPSULE | ORAL | Status: AC
  Filled 2017-05-13: qty 2

## 2017-05-13 MED ORDER — SODIUM CHLORIDE 0.9% FLUSH
10.0000 mL | INTRAVENOUS | Status: DC | PRN
  Administered 2017-05-13: 10 mL
  Filled 2017-05-13: qty 10

## 2017-05-13 MED ORDER — SODIUM CHLORIDE 0.9 % IV SOLN
Freq: Once | INTRAVENOUS | Status: AC
  Administered 2017-05-13: 10:00:00 via INTRAVENOUS

## 2017-05-13 MED ORDER — PROCHLORPERAZINE MALEATE 10 MG PO TABS
10.0000 mg | ORAL_TABLET | Freq: Once | ORAL | Status: AC
  Administered 2017-05-13: 10 mg via ORAL

## 2017-05-13 MED ORDER — METHYLPREDNISOLONE SODIUM SUCC 125 MG IJ SOLR
125.0000 mg | Freq: Once | INTRAMUSCULAR | Status: AC
  Administered 2017-05-13: 125 mg via INTRAVENOUS

## 2017-05-13 MED ORDER — DENOSUMAB 120 MG/1.7ML ~~LOC~~ SOLN
120.0000 mg | Freq: Once | SUBCUTANEOUS | Status: AC
  Administered 2017-05-13: 120 mg via SUBCUTANEOUS

## 2017-05-13 MED ORDER — PROCHLORPERAZINE MALEATE 10 MG PO TABS
ORAL_TABLET | ORAL | Status: AC
  Filled 2017-05-13: qty 1

## 2017-05-13 MED ORDER — ACETAMINOPHEN 325 MG PO TABS
ORAL_TABLET | ORAL | Status: AC
  Filled 2017-05-13: qty 2

## 2017-05-13 MED ORDER — DENOSUMAB 120 MG/1.7ML ~~LOC~~ SOLN
SUBCUTANEOUS | Status: AC
  Filled 2017-05-13: qty 1.7

## 2017-05-13 MED ORDER — DIPHENHYDRAMINE HCL 25 MG PO CAPS
50.0000 mg | ORAL_CAPSULE | Freq: Once | ORAL | Status: AC
  Administered 2017-05-13: 50 mg via ORAL

## 2017-05-13 MED ORDER — DARATUMUMAB CHEMO INJECTION 400 MG/20ML
15.7000 mg/kg | Freq: Once | INTRAVENOUS | Status: AC
  Administered 2017-05-13: 1600 mg via INTRAVENOUS
  Filled 2017-05-13: qty 80

## 2017-05-13 NOTE — Progress Notes (Signed)
Hematology and Oncology Follow Up Visit  Gene Hill [id number] 22-Jul-1940 77 y.o. 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 He is doing well but has had some intermittent fatigue and SOB with over exertion. He takes time to rest as needed.  Earlier this month Gene M-spike was 0.3, IgG level 302 mg/dL and kappa light chain 1.45 mg/dL.  No fever, chills, n/v, cough, rash, dizziness, chest pain, palpitations, abdominal pain or changes in bowel or bladder habits.  No swelling in Gene extremities. He is doing well in PT since having Gene rotator cuff repair of the left shoulder in January.  No numbness and tingling in Gene feet seems to have improved.  He has maintained a good appetite and is staying well hydrated. Gene weight is stable.   ECOG Performance Status: 1 - Symptomatic but completely ambulatory  Medications:  Allergies as of 05/13/2017   No Known Allergies     Medication List        Accurate as of 05/13/17  8:51 AM. Always use your most recent med list.          aspirin 325 MG tablet Take 325 mg by mouth daily.   cyclobenzaprine 10 MG tablet Commonly known as:  FLEXERIL Take 1 tablet (10 mg total) by mouth at bedtime as needed for muscle spasms.   DARZALEX IV Inject into the vein every 14 (fourteen) days.   dexamethasone 2 MG tablet Commonly known as:  DECADRON Take 4mg  day after chemo then 2mg  daily x 4   Fish Oil 1200 MG Caps Take 1,200 mg by mouth daily.   lidocaine 5 % ointment Commonly known as:  XYLOCAINE Apply 1 application topically 2 (two) times daily as needed. Mix w/ OTC Hydrocortisone 1 %   metoprolol tartrate 100 MG  tablet Commonly known as:  LOPRESSOR Take 0.5 tablets (50 mg total) by mouth 2 (two) times daily.   multivitamin tablet Take 1 tablet by mouth daily.   oxyCODONE-acetaminophen 5-325 MG tablet Commonly known as:  PERCOCET/ROXICET TK 1 TO 2 TS PO Q 4 TO 6 H PRN P   pantoprazole 40 MG tablet Commonly known as:  PROTONIX Take 1 tablet (40 mg total) by mouth daily.   PROBIOTIC PO Take 1 tablet by mouth daily.   traMADol 50 MG tablet Commonly known as:  ULTRAM Take 0.5-1 tablets (25-50 mg total) by mouth 2 (two) times daily as needed.       Allergies: No Known Allergies  Past Medical History, Surgical history, Social history, and Family History were reviewed and updated.  Review of Systems: All other 10 point review of systems is negative.   Physical Exam:  weight is 226 lb (102.5 kg). Gene oral temperature is 98 F (36.7 C). Gene blood pressure is 139/79 and Gene pulse is 72. Gene oxygen saturation is 99%.   Wt Readings from Last 3 Encounters:  05/13/17 226 lb (102.5 kg)  04/29/17 222 lb (100.7 kg)  04/15/17 221 lb (100.2 kg)    Ocular: Sclerae unicteric, pupils equal, round and reactive to light Ear-nose-throat: Oropharynx clear, dentition  fair Lymphatic: No cervical, supraclavicular or axillary adenopathy Lungs no rales or rhonchi, good excursion bilaterally Heart regular rate and rhythm, no murmur appreciated Abd soft, nontender, positive bowel sounds, no liver or spleen tip palpated on exam, no fluid wave  MSK no focal spinal tenderness, no joint edema Neuro: non-focal, well-oriented, appropriate affect Breasts: Deferred   Lab Results  Component Value Date   WBC 3.8 (L) 05/13/2017   HGB 12.4 (L) 01/21/2017   HCT 39.0 05/13/2017   MCV 102.1 (H) 05/13/2017   PLT 161 05/13/2017   Lab Results  Component Value Date   FERRITIN 351 (H) 06/26/2011   IRON 75 06/26/2011   TIBC 353 06/26/2011   UIBC 278 06/26/2011   IRONPCTSAT 21 06/26/2011   Lab Results   Component Value Date   RBC 3.82 (L) 05/13/2017   Lab Results  Component Value Date   KPAFRELGTCHN 14.5 04/29/2017   LAMBDASER 2.3 (L) 04/29/2017   KAPLAMBRATIO 6.30 (H) 04/29/2017   Lab Results  Component Value Date   IGGSERUM 302 (L) 04/29/2017   IGGSERUM 303 (L) 04/29/2017   IGA 433 04/29/2017   IGA 431 04/29/2017   IGMSERUM 7 (L) 04/29/2017   IGMSERUM 8 (L) 04/29/2017   Lab Results  Component Value Date   TOTALPROTELP 6.3 04/29/2017   ALBUMINELP 3.5 04/29/2017   A1GS 0.2 04/29/2017   A2GS 1.0 04/29/2017   BETS 1.0 04/29/2017   BETA2SER 2.0 (H) 12/08/2014   GAMS 0.6 04/29/2017   MSPIKE 0.3 (H) 04/29/2017   SPEI * 12/08/2014     Chemistry      Component Value Date/Time   NA 141 04/29/2017 0815   NA 141 01/21/2017 0741   NA 139 06/14/2016 1246   K 3.8 04/29/2017 0815   K 4.0 01/21/2017 0741   K 3.8 06/14/2016 1246   CL 103 04/29/2017 0815   CL 103 01/21/2017 0741   CO2 28 04/29/2017 0815   CO2 30 01/21/2017 0741   CO2 24 06/14/2016 1246   BUN 14 04/29/2017 0815   BUN 20 01/21/2017 0741   BUN 15.0 06/14/2016 1246   CREATININE 1.10 04/29/2017 0815   CREATININE 1.0 01/21/2017 0741   CREATININE 0.9 06/14/2016 1246      Component Value Date/Time   CALCIUM 9.7 04/29/2017 0815   CALCIUM 9.0 01/21/2017 0741   CALCIUM 9.6 06/14/2016 1246   ALKPHOS 42 04/29/2017 0815   ALKPHOS 37 01/21/2017 0741   ALKPHOS 54 06/14/2016 1246   AST 24 04/29/2017 0815   AST 25 06/14/2016 1246   ALT 25 04/29/2017 0815   ALT 29 01/21/2017 0741   ALT 21 06/14/2016 1246   BILITOT 0.5 04/29/2017 0815   BILITOT 0.37 06/14/2016 1246      Impression and Plan: Gene Hill is a very pleasant 77 yo caucasian gentleman with history of IgA kappa myeloma, high risk cytogenetics with a 17p- abnormality. He was treated with standard therapy until remission and then underwent stem cell transplant in November 2011.  Myeloma studies for this visit are pending. We will proceed with treatment  today as planned.  We will also resume Xgeva today.  We will plan to see him back in another 2 weeks for follow-up, lab and treatment.  He will contact our office with any questions or concerns. We can certainly see him sooner if need be.   Gene Peace, NP 4/22/20198:51 AM

## 2017-05-13 NOTE — Addendum Note (Signed)
Addended by: [name] on: 05/13/2017 09:46 AM   Modules accepted: Orders

## 2017-05-13 NOTE — Patient Instructions (Signed)
Implanted Port Insertion, Care After °This sheet gives you information about how to care for yourself after your procedure. Your health care provider may also give you more specific instructions. If you have problems or questions, contact your health care provider. °What can I expect after the procedure? °After your procedure, it is common to have: °· Discomfort at the port insertion site. °· Bruising on the skin over the port. This should improve over 3-4 days. ° °Follow these instructions at home: °Port care °· After your port is placed, you will get a manufacturer's information card. The card has information about your port. Keep this card with you at all times. °· Take care of the port as told by your health care provider. Ask your health care provider if you or a family member can get training for taking care of the port at home. A home health care nurse may also take care of the port. °· Make sure to remember what type of port you have. °Incision care °· Follow instructions from your health care provider about how to take care of your port insertion site. Make sure you: °? Wash your hands with soap and water before you change your bandage (dressing). If soap and water are not available, use hand sanitizer. °? Change your dressing as told by your health care provider. °? Leave stitches (sutures), skin glue, or adhesive strips in place. These skin closures may need to stay in place for 2 weeks or longer. If adhesive strip edges start to loosen and curl up, you may trim the loose edges. Do not remove adhesive strips completely unless your health care provider tells you to do that. °· Check your port insertion site every day for signs of infection. Check for: °? More redness, swelling, or pain. °? More fluid or blood. °? Warmth. °? Pus or a bad smell. °General instructions °· Do not take baths, swim, or use a hot tub until your health care provider approves. °· Do not lift anything that is heavier than 10 lb (4.5  kg) for a week, or as told by your health care provider. °· Ask your health care provider when it is okay to: °? Return to work or school. °? Resume usual physical activities or sports. °· Do not drive for 24 hours if you were given a medicine to help you relax (sedative). °· Take over-the-counter and prescription medicines only as told by your health care provider. °· Wear a medical alert bracelet in case of an emergency. This will tell any health care providers that you have a port. °· Keep all follow-up visits as told by your health care provider. This is important. °Contact a health care provider if: °· You cannot flush your port with saline as directed, or you cannot draw blood from the port. °· You have a fever or chills. °· You have more redness, swelling, or pain around your port insertion site. °· You have more fluid or blood coming from your port insertion site. °· Your port insertion site feels warm to the touch. °· You have pus or a bad smell coming from the port insertion site. °Get help right away if: °· You have chest pain or shortness of breath. °· You have bleeding from your port that you cannot control. °Summary °· Take care of the port as told by your health care provider. °· Change your dressing as told by your health care provider. °· Keep all follow-up visits as told by your health care provider. °  This information is not intended to replace advice given to you by your health care provider. Make sure you discuss any questions you have with your health care provider. °Document Released: 10/29/2012 Document Revised: 11/30/2015 Document Reviewed: 11/30/2015 °Elsevier Interactive Patient Education © 2017 Elsevier Inc. ° °

## 2017-05-13 NOTE — Patient Instructions (Signed)
[facility] Discharge Instructions for Patients Receiving Chemotherapy  Today you received the following chemotherapy agents Darzalex.  To help prevent nausea and vomiting after your treatment, we encourage you to take your nausea medication    If you develop nausea and vomiting that is not controlled by your nausea medication, call the clinic.   BELOW ARE SYMPTOMS THAT SHOULD BE REPORTED IMMEDIATELY:  *FEVER GREATER THAN 100.5 F  *CHILLS WITH OR WITHOUT FEVER  NAUSEA AND VOMITING THAT IS NOT CONTROLLED WITH YOUR NAUSEA MEDICATION  *UNUSUAL SHORTNESS OF BREATH  *UNUSUAL BRUISING OR BLEEDING  TENDERNESS IN MOUTH AND THROAT WITH OR WITHOUT PRESENCE OF ULCERS  *URINARY PROBLEMS  *BOWEL PROBLEMS  UNUSUAL RASH Items with * indicate a potential emergency and should be followed up as soon as possible.  Feel free to call the clinic should you have any questions or concerns. The clinic phone number is [phone].  Please show the CHEMO ALERT CARD at check-in to the Emergency Department and triage nurse.   

## 2017-05-14 DIAGNOSIS — M25512 Pain in left shoulder: Secondary | ICD-10-CM | POA: Diagnosis not present

## 2017-05-14 LAB — IGG, IGA, IGM
IgA: 463 mg/dL — ABNORMAL HIGH (ref 61–437)
IgG (Immunoglobin G), Serum: 293 mg/dL — ABNORMAL LOW (ref 700–1600)
IgM (Immunoglobulin M), Srm: 7 mg/dL — ABNORMAL LOW (ref 15–143)

## 2017-05-14 LAB — KAPPA/LAMBDA LIGHT CHAINS
KAPPA FREE LGHT CHN: 13.4 mg/L (ref 3.3–19.4)
KAPPA, LAMDA LIGHT CHAIN RATIO: 4.96 — AB (ref 0.26–1.65)
LAMDA FREE LIGHT CHAINS: 2.7 mg/L — AB (ref 5.7–26.3)

## 2017-05-15 DIAGNOSIS — M25512 Pain in left shoulder: Secondary | ICD-10-CM | POA: Diagnosis not present

## 2017-05-15 DIAGNOSIS — M75122 Complete rotator cuff tear or rupture of left shoulder, not specified as traumatic: Secondary | ICD-10-CM | POA: Diagnosis not present

## 2017-05-15 DIAGNOSIS — M25612 Stiffness of left shoulder, not elsewhere classified: Secondary | ICD-10-CM | POA: Diagnosis not present

## 2017-05-15 DIAGNOSIS — M6281 Muscle weakness (generalized): Secondary | ICD-10-CM | POA: Diagnosis not present

## 2017-05-16 LAB — PROTEIN ELECTROPHORESIS, SERUM, WITH REFLEX
A/G RATIO SPE: 1.4 (ref 0.7–1.7)
ALPHA-2-GLOBULIN: 0.9 g/dL (ref 0.4–1.0)
Albumin ELP: 3.6 g/dL (ref 2.9–4.4)
Alpha-1-Globulin: 0.2 g/dL (ref 0.0–0.4)
Beta Globulin: 1.1 g/dL (ref 0.7–1.3)
GLOBULIN, TOTAL: 2.6 g/dL (ref 2.2–3.9)
Gamma Globulin: 0.3 g/dL — ABNORMAL LOW (ref 0.4–1.8)
M-SPIKE, %: 0.2 g/dL — AB
SPEP Interpretation: 0
Total Protein ELP: 6.2 g/dL (ref 6.0–8.5)

## 2017-05-16 LAB — IMMUNOFIXATION REFLEX, SERUM
IGA: 429 mg/dL (ref 61–437)
IgG (Immunoglobin G), Serum: 305 mg/dL — ABNORMAL LOW (ref 700–1600)
IgM (Immunoglobulin M), Srm: 7 mg/dL — ABNORMAL LOW (ref 15–143)

## 2017-05-17 ENCOUNTER — Other Ambulatory Visit: Payer: Self-pay | Admitting: Hematology & Oncology

## 2017-05-17 ENCOUNTER — Telehealth: Payer: Self-pay | Admitting: *Deleted

## 2017-05-17 DIAGNOSIS — C9000 Multiple myeloma not having achieved remission: Secondary | ICD-10-CM

## 2017-05-17 NOTE — Telephone Encounter (Addendum)
Patient's wife aware of results  ----- Message from [name], MD sent at 05/16/2017  4:39 PM EDT ----- Call - myeloma is still very low!!!  The level is now 0.2!!  Gene Hill

## 2017-05-20 DIAGNOSIS — M25612 Stiffness of left shoulder, not elsewhere classified: Secondary | ICD-10-CM | POA: Diagnosis not present

## 2017-05-20 DIAGNOSIS — M75122 Complete rotator cuff tear or rupture of left shoulder, not specified as traumatic: Secondary | ICD-10-CM | POA: Diagnosis not present

## 2017-05-20 DIAGNOSIS — M25512 Pain in left shoulder: Secondary | ICD-10-CM | POA: Diagnosis not present

## 2017-05-20 DIAGNOSIS — M6281 Muscle weakness (generalized): Secondary | ICD-10-CM | POA: Diagnosis not present

## 2017-05-22 DIAGNOSIS — M25612 Stiffness of left shoulder, not elsewhere classified: Secondary | ICD-10-CM | POA: Diagnosis not present

## 2017-05-22 DIAGNOSIS — M25512 Pain in left shoulder: Secondary | ICD-10-CM | POA: Diagnosis not present

## 2017-05-22 DIAGNOSIS — M6281 Muscle weakness (generalized): Secondary | ICD-10-CM | POA: Diagnosis not present

## 2017-05-22 DIAGNOSIS — M75122 Complete rotator cuff tear or rupture of left shoulder, not specified as traumatic: Secondary | ICD-10-CM | POA: Diagnosis not present

## 2017-05-27 ENCOUNTER — Inpatient Hospital Stay: Payer: PPO | Attending: Hematology & Oncology

## 2017-05-27 ENCOUNTER — Inpatient Hospital Stay: Payer: PPO

## 2017-05-27 VITALS — BP 149/76 | HR 75 | Temp 98.9°F | Resp 17 | Wt 223.2 lb

## 2017-05-27 DIAGNOSIS — Z79899 Other long term (current) drug therapy: Secondary | ICD-10-CM | POA: Diagnosis not present

## 2017-05-27 DIAGNOSIS — Z5111 Encounter for antineoplastic chemotherapy: Secondary | ICD-10-CM | POA: Insufficient documentation

## 2017-05-27 DIAGNOSIS — Z9484 Stem cells transplant status: Secondary | ICD-10-CM | POA: Diagnosis not present

## 2017-05-27 DIAGNOSIS — C9000 Multiple myeloma not having achieved remission: Secondary | ICD-10-CM

## 2017-05-27 DIAGNOSIS — C9002 Multiple myeloma in relapse: Secondary | ICD-10-CM | POA: Diagnosis not present

## 2017-05-27 LAB — CBC WITH DIFFERENTIAL (CANCER CENTER ONLY)
Basophils Absolute: 0 10*3/uL (ref 0.0–0.1)
Basophils Relative: 0 %
EOS ABS: 0 10*3/uL (ref 0.0–0.5)
Eosinophils Relative: 1 %
HEMATOCRIT: 39.1 % (ref 38.7–49.9)
HEMOGLOBIN: 12.7 g/dL — AB (ref 13.0–17.1)
LYMPHS ABS: 1 10*3/uL (ref 0.9–3.3)
LYMPHS PCT: 29 %
MCH: 33.1 pg (ref 28.0–33.4)
MCHC: 32.5 g/dL (ref 32.0–35.9)
MCV: 101.8 fL — AB (ref 82.0–98.0)
MONOS PCT: 17 %
Monocytes Absolute: 0.6 10*3/uL (ref 0.1–0.9)
NEUTROS ABS: 1.8 10*3/uL (ref 1.5–6.5)
NEUTROS PCT: 53 %
Platelet Count: 179 10*3/uL (ref 145–400)
RBC: 3.84 MIL/uL — AB (ref 4.20–5.70)
RDW: 14.8 % (ref 11.1–15.7)
WBC: 3.5 10*3/uL — AB (ref 4.0–10.0)

## 2017-05-27 LAB — CMP (CANCER CENTER ONLY)
ALK PHOS: 40 U/L (ref 26–84)
ALT: 29 U/L (ref 10–47)
AST: 24 U/L (ref 11–38)
Albumin: 3.5 g/dL (ref 3.5–5.0)
Anion gap: 5 (ref 5–15)
BILIRUBIN TOTAL: 0.6 mg/dL (ref 0.2–1.6)
BUN: 10 mg/dL (ref 7–22)
CO2: 29 mmol/L (ref 18–33)
Calcium: 9.1 mg/dL (ref 8.0–10.3)
Chloride: 107 mmol/L (ref 98–108)
Creatinine: 0.8 mg/dL (ref 0.60–1.20)
Glucose, Bld: 107 mg/dL (ref 73–118)
POTASSIUM: 3.5 mmol/L (ref 3.3–4.7)
Sodium: 141 mmol/L (ref 128–145)
TOTAL PROTEIN: 6.3 g/dL — AB (ref 6.4–8.1)

## 2017-05-27 LAB — LACTATE DEHYDROGENASE: LDH: 176 U/L (ref 125–245)

## 2017-05-27 MED ORDER — PROCHLORPERAZINE MALEATE 10 MG PO TABS
ORAL_TABLET | ORAL | Status: AC
  Filled 2017-05-27: qty 1

## 2017-05-27 MED ORDER — ACETAMINOPHEN 325 MG PO TABS
ORAL_TABLET | ORAL | Status: AC
  Filled 2017-05-27: qty 2

## 2017-05-27 MED ORDER — SODIUM CHLORIDE 0.9 % IV SOLN
Freq: Once | INTRAVENOUS | Status: AC
  Administered 2017-05-27: 09:00:00 via INTRAVENOUS

## 2017-05-27 MED ORDER — SODIUM CHLORIDE 0.9 % IV SOLN
15.6000 mg/kg | Freq: Once | INTRAVENOUS | Status: AC
  Administered 2017-05-27: 1600 mg via INTRAVENOUS
  Filled 2017-05-27: qty 80

## 2017-05-27 MED ORDER — ACETAMINOPHEN 325 MG PO TABS
650.0000 mg | ORAL_TABLET | Freq: Once | ORAL | Status: AC
  Administered 2017-05-27: 650 mg via ORAL

## 2017-05-27 MED ORDER — DIPHENHYDRAMINE HCL 25 MG PO CAPS
ORAL_CAPSULE | ORAL | Status: AC
  Filled 2017-05-27: qty 2

## 2017-05-27 MED ORDER — SODIUM CHLORIDE 0.9% FLUSH
10.0000 mL | INTRAVENOUS | Status: DC | PRN
  Administered 2017-05-27: 10 mL
  Filled 2017-05-27: qty 10

## 2017-05-27 MED ORDER — HEPARIN SOD (PORK) LOCK FLUSH 100 UNIT/ML IV SOLN
500.0000 [IU] | Freq: Once | INTRAVENOUS | Status: AC | PRN
  Administered 2017-05-27: 500 [IU]
  Filled 2017-05-27: qty 5

## 2017-05-27 MED ORDER — METHYLPREDNISOLONE SODIUM SUCC 125 MG IJ SOLR
125.0000 mg | Freq: Once | INTRAMUSCULAR | Status: AC
  Administered 2017-05-27: 125 mg via INTRAVENOUS

## 2017-05-27 MED ORDER — METHYLPREDNISOLONE SODIUM SUCC 125 MG IJ SOLR
INTRAMUSCULAR | Status: AC
  Filled 2017-05-27: qty 2

## 2017-05-27 MED ORDER — DIPHENHYDRAMINE HCL 25 MG PO CAPS
50.0000 mg | ORAL_CAPSULE | Freq: Once | ORAL | Status: AC
  Administered 2017-05-27: 50 mg via ORAL

## 2017-05-27 MED ORDER — PROCHLORPERAZINE MALEATE 10 MG PO TABS
10.0000 mg | ORAL_TABLET | Freq: Once | ORAL | Status: AC
  Administered 2017-05-27: 10 mg via ORAL

## 2017-05-27 NOTE — Patient Instructions (Signed)
Implanted Port Insertion, Care After °This sheet gives you information about how to care for yourself after your procedure. Your health care provider may also give you more specific instructions. If you have problems or questions, contact your health care provider. °What can I expect after the procedure? °After your procedure, it is common to have: °· Discomfort at the port insertion site. °· Bruising on the skin over the port. This should improve over 3-4 days. ° °Follow these instructions at home: °Port care °· After your port is placed, you will get a manufacturer's information card. The card has information about your port. Keep this card with you at all times. °· Take care of the port as told by your health care provider. Ask your health care provider if you or a family member can get training for taking care of the port at home. A home health care nurse may also take care of the port. °· Make sure to remember what type of port you have. °Incision care °· Follow instructions from your health care provider about how to take care of your port insertion site. Make sure you: °? Wash your hands with soap and water before you change your bandage (dressing). If soap and water are not available, use hand sanitizer. °? Change your dressing as told by your health care provider. °? Leave stitches (sutures), skin glue, or adhesive strips in place. These skin closures may need to stay in place for 2 weeks or longer. If adhesive strip edges start to loosen and curl up, you may trim the loose edges. Do not remove adhesive strips completely unless your health care provider tells you to do that. °· Check your port insertion site every day for signs of infection. Check for: °? More redness, swelling, or pain. °? More fluid or blood. °? Warmth. °? Pus or a bad smell. °General instructions °· Do not take baths, swim, or use a hot tub until your health care provider approves. °· Do not lift anything that is heavier than 10 lb (4.5  kg) for a week, or as told by your health care provider. °· Ask your health care provider when it is okay to: °? Return to work or school. °? Resume usual physical activities or sports. °· Do not drive for 24 hours if you were given a medicine to help you relax (sedative). °· Take over-the-counter and prescription medicines only as told by your health care provider. °· Wear a medical alert bracelet in case of an emergency. This will tell any health care providers that you have a port. °· Keep all follow-up visits as told by your health care provider. This is important. °Contact a health care provider if: °· You cannot flush your port with saline as directed, or you cannot draw blood from the port. °· You have a fever or chills. °· You have more redness, swelling, or pain around your port insertion site. °· You have more fluid or blood coming from your port insertion site. °· Your port insertion site feels warm to the touch. °· You have pus or a bad smell coming from the port insertion site. °Get help right away if: °· You have chest pain or shortness of breath. °· You have bleeding from your port that you cannot control. °Summary °· Take care of the port as told by your health care provider. °· Change your dressing as told by your health care provider. °· Keep all follow-up visits as told by your health care provider. °  This information is not intended to replace advice given to you by your health care provider. Make sure you discuss any questions you have with your health care provider. °Document Released: 10/29/2012 Document Revised: 11/30/2015 Document Reviewed: 11/30/2015 °Elsevier Interactive Patient Education © 2017 Elsevier Inc. ° °

## 2017-05-27 NOTE — Patient Instructions (Signed)
[facility] Discharge Instructions for Patients Receiving Chemotherapy  Today you received the following chemotherapy agents Darzalex.  To help prevent nausea and vomiting after your treatment, we encourage you to take your nausea medication    If you develop nausea and vomiting that is not controlled by your nausea medication, call the clinic.   BELOW ARE SYMPTOMS THAT SHOULD BE REPORTED IMMEDIATELY:  *FEVER GREATER THAN 100.5 F  *CHILLS WITH OR WITHOUT FEVER  NAUSEA AND VOMITING THAT IS NOT CONTROLLED WITH YOUR NAUSEA MEDICATION  *UNUSUAL SHORTNESS OF BREATH  *UNUSUAL BRUISING OR BLEEDING  TENDERNESS IN MOUTH AND THROAT WITH OR WITHOUT PRESENCE OF ULCERS  *URINARY PROBLEMS  *BOWEL PROBLEMS  UNUSUAL RASH Items with * indicate a potential emergency and should be followed up as soon as possible.  Feel free to call the clinic should you have any questions or concerns. The clinic phone number is [phone].  Please show the CHEMO ALERT CARD at check-in to the Emergency Department and triage nurse.   

## 2017-05-28 DIAGNOSIS — M75122 Complete rotator cuff tear or rupture of left shoulder, not specified as traumatic: Secondary | ICD-10-CM | POA: Diagnosis not present

## 2017-05-28 DIAGNOSIS — M6281 Muscle weakness (generalized): Secondary | ICD-10-CM | POA: Diagnosis not present

## 2017-05-28 DIAGNOSIS — M25612 Stiffness of left shoulder, not elsewhere classified: Secondary | ICD-10-CM | POA: Diagnosis not present

## 2017-05-28 DIAGNOSIS — M25512 Pain in left shoulder: Secondary | ICD-10-CM | POA: Diagnosis not present

## 2017-05-28 LAB — KAPPA/LAMBDA LIGHT CHAINS
KAPPA, LAMDA LIGHT CHAIN RATIO: 7.44 — AB (ref 0.26–1.65)
Kappa free light chain: 13.4 mg/L (ref 3.3–19.4)
LAMDA FREE LIGHT CHAINS: 1.8 mg/L — AB (ref 5.7–26.3)

## 2017-05-28 LAB — IGG, IGA, IGM
IGG (IMMUNOGLOBIN G), SERUM: 277 mg/dL — AB (ref 700–1600)
IgA: 447 mg/dL — ABNORMAL HIGH (ref 61–437)
IgM (Immunoglobulin M), Srm: 10 mg/dL — ABNORMAL LOW (ref 15–143)

## 2017-05-29 LAB — PROTEIN ELECTROPHORESIS, SERUM
A/G RATIO SPE: 1.3 (ref 0.7–1.7)
ALPHA-2-GLOBULIN: 1 g/dL (ref 0.4–1.0)
Albumin ELP: 3.5 g/dL (ref 2.9–4.4)
Alpha-1-Globulin: 0.2 g/dL (ref 0.0–0.4)
Beta Globulin: 1.2 g/dL (ref 0.7–1.3)
GLOBULIN, TOTAL: 2.6 g/dL (ref 2.2–3.9)
Gamma Globulin: 0.3 g/dL — ABNORMAL LOW (ref 0.4–1.8)
M-Spike, %: 0.3 g/dL — ABNORMAL HIGH
Total Protein ELP: 6.1 g/dL (ref 6.0–8.5)

## 2017-05-30 DIAGNOSIS — M25612 Stiffness of left shoulder, not elsewhere classified: Secondary | ICD-10-CM | POA: Diagnosis not present

## 2017-05-30 DIAGNOSIS — M6281 Muscle weakness (generalized): Secondary | ICD-10-CM | POA: Diagnosis not present

## 2017-05-30 DIAGNOSIS — M75122 Complete rotator cuff tear or rupture of left shoulder, not specified as traumatic: Secondary | ICD-10-CM | POA: Diagnosis not present

## 2017-05-30 DIAGNOSIS — M25512 Pain in left shoulder: Secondary | ICD-10-CM | POA: Diagnosis not present

## 2017-06-05 DIAGNOSIS — M6281 Muscle weakness (generalized): Secondary | ICD-10-CM | POA: Diagnosis not present

## 2017-06-05 DIAGNOSIS — M25512 Pain in left shoulder: Secondary | ICD-10-CM | POA: Diagnosis not present

## 2017-06-05 DIAGNOSIS — M75122 Complete rotator cuff tear or rupture of left shoulder, not specified as traumatic: Secondary | ICD-10-CM | POA: Diagnosis not present

## 2017-06-05 DIAGNOSIS — M25612 Stiffness of left shoulder, not elsewhere classified: Secondary | ICD-10-CM | POA: Diagnosis not present

## 2017-06-10 ENCOUNTER — Inpatient Hospital Stay: Payer: PPO

## 2017-06-10 ENCOUNTER — Other Ambulatory Visit: Payer: Self-pay

## 2017-06-10 VITALS — BP 127/74 | HR 78 | Temp 98.1°F | Resp 18

## 2017-06-10 DIAGNOSIS — C9000 Multiple myeloma not having achieved remission: Secondary | ICD-10-CM

## 2017-06-10 DIAGNOSIS — Z5111 Encounter for antineoplastic chemotherapy: Secondary | ICD-10-CM | POA: Diagnosis not present

## 2017-06-10 LAB — CBC WITH DIFFERENTIAL (CANCER CENTER ONLY)
BASOS ABS: 0 10*3/uL (ref 0.0–0.1)
Basophils Relative: 0 %
EOS ABS: 0.1 10*3/uL (ref 0.0–0.5)
EOS PCT: 1 %
HEMATOCRIT: 38 % — AB (ref 38.7–49.9)
Hemoglobin: 12.4 g/dL — ABNORMAL LOW (ref 13.0–17.1)
Lymphocytes Relative: 31 %
Lymphs Abs: 1.2 10*3/uL (ref 0.9–3.3)
MCH: 33.2 pg (ref 28.0–33.4)
MCHC: 32.6 g/dL (ref 32.0–35.9)
MCV: 101.6 fL — ABNORMAL HIGH (ref 82.0–98.0)
MONO ABS: 0.7 10*3/uL (ref 0.1–0.9)
Monocytes Relative: 19 %
Neutro Abs: 1.9 10*3/uL (ref 1.5–6.5)
Neutrophils Relative %: 49 %
PLATELETS: 168 10*3/uL (ref 145–400)
RBC: 3.74 MIL/uL — ABNORMAL LOW (ref 4.20–5.70)
RDW: 14.9 % (ref 11.1–15.7)
WBC Count: 3.9 10*3/uL — ABNORMAL LOW (ref 4.0–10.0)

## 2017-06-10 LAB — CMP (CANCER CENTER ONLY)
ALBUMIN: 3.4 g/dL — AB (ref 3.5–5.0)
ALT: 30 U/L (ref 10–47)
AST: 25 U/L (ref 11–38)
Alkaline Phosphatase: 37 U/L (ref 26–84)
Anion gap: 9 (ref 5–15)
BILIRUBIN TOTAL: 0.5 mg/dL (ref 0.2–1.6)
BUN: 15 mg/dL (ref 7–22)
CHLORIDE: 104 mmol/L (ref 98–108)
CO2: 29 mmol/L (ref 18–33)
CREATININE: 1 mg/dL (ref 0.60–1.20)
Calcium: 9.3 mg/dL (ref 8.0–10.3)
GLUCOSE: 109 mg/dL (ref 73–118)
Potassium: 3.8 mmol/L (ref 3.3–4.7)
SODIUM: 142 mmol/L (ref 128–145)
Total Protein: 6.2 g/dL — ABNORMAL LOW (ref 6.4–8.1)

## 2017-06-10 MED ORDER — DIPHENHYDRAMINE HCL 25 MG PO CAPS
50.0000 mg | ORAL_CAPSULE | Freq: Once | ORAL | Status: AC
  Administered 2017-06-10: 50 mg via ORAL

## 2017-06-10 MED ORDER — DIPHENHYDRAMINE HCL 25 MG PO CAPS
ORAL_CAPSULE | ORAL | Status: AC
  Filled 2017-06-10: qty 2

## 2017-06-10 MED ORDER — HEPARIN SOD (PORK) LOCK FLUSH 100 UNIT/ML IV SOLN
500.0000 [IU] | Freq: Once | INTRAVENOUS | Status: AC | PRN
  Administered 2017-06-10: 500 [IU]
  Filled 2017-06-10: qty 5

## 2017-06-10 MED ORDER — PROCHLORPERAZINE MALEATE 10 MG PO TABS
10.0000 mg | ORAL_TABLET | Freq: Once | ORAL | Status: AC
  Administered 2017-06-10: 10 mg via ORAL

## 2017-06-10 MED ORDER — SODIUM CHLORIDE 0.9 % IV SOLN
Freq: Once | INTRAVENOUS | Status: AC
  Administered 2017-06-10: 09:00:00 via INTRAVENOUS

## 2017-06-10 MED ORDER — METHYLPREDNISOLONE SODIUM SUCC 125 MG IJ SOLR
125.0000 mg | Freq: Once | INTRAMUSCULAR | Status: AC
  Administered 2017-06-10: 125 mg via INTRAVENOUS

## 2017-06-10 MED ORDER — ACETAMINOPHEN 325 MG PO TABS
ORAL_TABLET | ORAL | Status: AC
  Filled 2017-06-10: qty 2

## 2017-06-10 MED ORDER — ACETAMINOPHEN 325 MG PO TABS
650.0000 mg | ORAL_TABLET | Freq: Once | ORAL | Status: AC
  Administered 2017-06-10: 650 mg via ORAL

## 2017-06-10 MED ORDER — SODIUM CHLORIDE 0.9% FLUSH
10.0000 mL | INTRAVENOUS | Status: DC | PRN
  Administered 2017-06-10: 10 mL
  Filled 2017-06-10: qty 10

## 2017-06-10 MED ORDER — METHYLPREDNISOLONE SODIUM SUCC 125 MG IJ SOLR
INTRAMUSCULAR | Status: AC
  Filled 2017-06-10: qty 2

## 2017-06-10 MED ORDER — SODIUM CHLORIDE 0.9 % IV SOLN
15.7000 mg/kg | Freq: Once | INTRAVENOUS | Status: AC
  Administered 2017-06-10: 1600 mg via INTRAVENOUS
  Filled 2017-06-10: qty 80

## 2017-06-10 MED ORDER — PROCHLORPERAZINE MALEATE 10 MG PO TABS
ORAL_TABLET | ORAL | Status: AC
  Filled 2017-06-10: qty 1

## 2017-06-10 MED ORDER — DIPHENHYDRAMINE HCL 25 MG PO CAPS
ORAL_CAPSULE | ORAL | Status: AC
  Filled 2017-06-10: qty 1

## 2017-06-10 NOTE — Patient Instructions (Signed)
Daratumumab injection What is this medicine? DARATUMUMAB (dar a toom ue mab) is a monoclonal antibody. It is used to treat multiple myeloma. This medicine may be used for other purposes; ask your health care provider or pharmacist if you have questions. COMMON BRAND NAME(S): DARZALEX What should I tell my health care provider before I take this medicine? They need to know if you have any of these conditions: -infection (especially a virus infection such as chickenpox, cold sores, or herpes) -lung or breathing disease -pregnant or trying to get pregnant -breast-feeding -an unusual or allergic reaction to daratumumab, other medicines, foods, dyes, or preservatives How should I use this medicine? This medicine is for infusion into a vein. It is given by a health care professional in a hospital or clinic setting. Talk to your pediatrician regarding the use of this medicine in children. Special care may be needed. Overdosage: If you think you have taken too much of this medicine contact a poison control center or emergency room at once. NOTE: This medicine is only for you. Do not share this medicine with others. What if I miss a dose? Keep appointments for follow-up doses as directed. It is important not to miss your dose. Call your doctor or health care professional if you are unable to keep an appointment. What may interact with this medicine? Interactions have not been studied. Give your health care provider a list of all the medicines, herbs, non-prescription drugs, or dietary supplements you use. Also tell them if you smoke, drink alcohol, or use illegal drugs. Some items may interact with your medicine. This list may not describe all possible interactions. Give your health care provider a list of all the medicines, herbs, non-prescription drugs, or dietary supplements you use. Also tell them if you smoke, drink alcohol, or use illegal drugs. Some items may interact with your medicine. What  should I watch for while using this medicine? This drug may make you feel generally unwell. Report any side effects. Continue your course of treatment even though you feel ill unless your doctor tells you to stop. This medicine can cause serious allergic reactions. To reduce your risk you may need to take medicine before treatment with this medicine. Take your medicine as directed. This medicine can affect the results of blood tests to match your blood type. These changes can last for up to 6 months after the final dose. Your healthcare provider will do blood tests to match your blood type before you start treatment. Tell all of your healthcare providers that you are being treated with this medicine before receiving a blood transfusion. This medicine can affect the results of some tests used to determine treatment response; extra tests may be needed to evaluate response. Do not become pregnant while taking this medicine or for 3 months after stopping it. Women should inform their doctor if they wish to become pregnant or think they might be pregnant. There is a potential for serious side effects to an unborn child. Talk to your health care professional or pharmacist for more information. What side effects may I notice from receiving this medicine? Side effects that you should report to your doctor or health care professional as soon as possible: -allergic reactions like skin rash, itching or hives, swelling of the face, lips, or tongue -breathing problems -chills -cough -dizziness -feeling faint or lightheaded -headache -low blood counts - this medicine may decrease the number of white blood cells, red blood cells and platelets. You may be at increased risk  for infections and bleeding. -nausea, vomiting -shortness of breath -signs of decreased platelets or bleeding - bruising, pinpoint red spots on the skin, black, tarry stools, blood in the urine -signs of decreased red blood cells - unusually  weak or tired, feeling faint or lightheaded, falls -signs of infection - fever or chills, cough, sore throat, pain or difficulty passing urine Side effects that usually do not require medical attention (report to your doctor or health care professional if they continue or are bothersome): -back pain -diarrhea -muscle cramps -pain, tingling, numbness in the hands or feet -swelling of the ankles, feet, hands -tiredness This list may not describe all possible side effects. Call your doctor for medical advice about side effects. You may report side effects to FDA at 1-800-FDA-1088. Where should I keep my medicine? Keep out of the reach of children. This drug is given in a hospital or clinic and will not be stored at home. NOTE: This sheet is a summary. It may not cover all possible information. If you have questions about this medicine, talk to your doctor, pharmacist, or health care provider.  2018 Elsevier/Gold Standard (2015-02-10 10:38:11)  

## 2017-06-10 NOTE — Patient Instructions (Signed)
Implanted Port Insertion, Care After °This sheet gives you information about how to care for yourself after your procedure. Your health care provider may also give you more specific instructions. If you have problems or questions, contact your health care provider. °What can I expect after the procedure? °After your procedure, it is common to have: °· Discomfort at the port insertion site. °· Bruising on the skin over the port. This should improve over 3-4 days. ° °Follow these instructions at home: °Port care °· After your port is placed, you will get a manufacturer's information card. The card has information about your port. Keep this card with you at all times. °· Take care of the port as told by your health care provider. Ask your health care provider if you or a family member can get training for taking care of the port at home. A home health care nurse may also take care of the port. °· Make sure to remember what type of port you have. °Incision care °· Follow instructions from your health care provider about how to take care of your port insertion site. Make sure you: °? Wash your hands with soap and water before you change your bandage (dressing). If soap and water are not available, use hand sanitizer. °? Change your dressing as told by your health care provider. °? Leave stitches (sutures), skin glue, or adhesive strips in place. These skin closures may need to stay in place for 2 weeks or longer. If adhesive strip edges start to loosen and curl up, you may trim the loose edges. Do not remove adhesive strips completely unless your health care provider tells you to do that. °· Check your port insertion site every day for signs of infection. Check for: °? More redness, swelling, or pain. °? More fluid or blood. °? Warmth. °? Pus or a bad smell. °General instructions °· Do not take baths, swim, or use a hot tub until your health care provider approves. °· Do not lift anything that is heavier than 10 lb (4.5  kg) for a week, or as told by your health care provider. °· Ask your health care provider when it is okay to: °? Return to work or school. °? Resume usual physical activities or sports. °· Do not drive for 24 hours if you were given a medicine to help you relax (sedative). °· Take over-the-counter and prescription medicines only as told by your health care provider. °· Wear a medical alert bracelet in case of an emergency. This will tell any health care providers that you have a port. °· Keep all follow-up visits as told by your health care provider. This is important. °Contact a health care provider if: °· You cannot flush your port with saline as directed, or you cannot draw blood from the port. °· You have a fever or chills. °· You have more redness, swelling, or pain around your port insertion site. °· You have more fluid or blood coming from your port insertion site. °· Your port insertion site feels warm to the touch. °· You have pus or a bad smell coming from the port insertion site. °Get help right away if: °· You have chest pain or shortness of breath. °· You have bleeding from your port that you cannot control. °Summary °· Take care of the port as told by your health care provider. °· Change your dressing as told by your health care provider. °· Keep all follow-up visits as told by your health care provider. °  This information is not intended to replace advice given to you by your health care provider. Make sure you discuss any questions you have with your health care provider. °Document Released: 10/29/2012 Document Revised: 11/30/2015 Document Reviewed: 11/30/2015 °Elsevier Interactive Patient Education © 2017 Elsevier Inc. ° °

## 2017-06-11 ENCOUNTER — Telehealth: Payer: Self-pay

## 2017-06-11 ENCOUNTER — Telehealth: Payer: Self-pay | Admitting: Internal Medicine

## 2017-06-11 MED ORDER — METHOCARBAMOL 500 MG PO TABS: 500.0000 mg | ORAL_TABLET | Freq: Two times a day (BID) | ORAL | 0 refills | Status: DC | PRN

## 2017-06-11 NOTE — Telephone Encounter (Signed)
PA initiated via Covermymeds; KEY: [id number]. Awaiting determination.

## 2017-06-11 NOTE — Telephone Encounter (Signed)
Copied from [place] [phone]. Topic: Quick Communication - See Telephone Encounter >> Jun 11, 2017 10:21 AM Neva Seat wrote: Pt states the cyclobenzaprine (FLEXERIL) 10 MG tablet he is taking at bedtime is not enough.  He needs more during the day.   Pt is asking if he can go back to Robaxin, because he has taken this in the past. Please call pt back to discuss.

## 2017-06-11 NOTE — Telephone Encounter (Signed)
He takes Flexeril for muscle spasms. Okay to change to Robaxin 500 mg twice a day as needed.  #60, no refills.  Watch for somnolence. If that is not helping please, advised patient needs office visit.

## 2017-06-11 NOTE — Telephone Encounter (Signed)
Please advise 

## 2017-06-11 NOTE — Telephone Encounter (Signed)
Rx sent 

## 2017-06-12 NOTE — Telephone Encounter (Signed)
PA approved. Effective 06/11/2017 through 01/21/2018.

## 2017-06-25 DIAGNOSIS — M25511 Pain in right shoulder: Secondary | ICD-10-CM | POA: Diagnosis not present

## 2017-07-05 ENCOUNTER — Other Ambulatory Visit: Payer: Self-pay | Admitting: *Deleted

## 2017-07-05 DIAGNOSIS — C9000 Multiple myeloma not having achieved remission: Secondary | ICD-10-CM

## 2017-07-08 ENCOUNTER — Inpatient Hospital Stay: Payer: PPO | Attending: Hematology & Oncology | Admitting: Hematology & Oncology

## 2017-07-08 ENCOUNTER — Inpatient Hospital Stay: Payer: PPO

## 2017-07-08 ENCOUNTER — Other Ambulatory Visit: Payer: Self-pay

## 2017-07-08 ENCOUNTER — Encounter: Payer: Self-pay | Admitting: Hematology & Oncology

## 2017-07-08 VITALS — BP 124/66 | HR 79 | Temp 98.1°F | Resp 16

## 2017-07-08 VITALS — BP 127/81 | HR 71 | Temp 98.4°F | Resp 17 | Wt 221.0 lb

## 2017-07-08 DIAGNOSIS — C9000 Multiple myeloma not having achieved remission: Secondary | ICD-10-CM

## 2017-07-08 DIAGNOSIS — Z79899 Other long term (current) drug therapy: Secondary | ICD-10-CM | POA: Diagnosis not present

## 2017-07-08 DIAGNOSIS — Z9484 Stem cells transplant status: Secondary | ICD-10-CM | POA: Insufficient documentation

## 2017-07-08 DIAGNOSIS — Z5111 Encounter for antineoplastic chemotherapy: Secondary | ICD-10-CM | POA: Insufficient documentation

## 2017-07-08 DIAGNOSIS — C9002 Multiple myeloma in relapse: Secondary | ICD-10-CM | POA: Diagnosis not present

## 2017-07-08 LAB — CBC WITH DIFFERENTIAL (CANCER CENTER ONLY)
BASOS ABS: 0 10*3/uL (ref 0.0–0.1)
BASOS PCT: 0 %
Eosinophils Absolute: 0 10*3/uL (ref 0.0–0.5)
Eosinophils Relative: 1 %
HEMATOCRIT: 39.1 % (ref 38.7–49.9)
HEMOGLOBIN: 12.7 g/dL — AB (ref 13.0–17.1)
LYMPHS PCT: 32 %
Lymphs Abs: 1.4 10*3/uL (ref 0.9–3.3)
MCH: 33.2 pg (ref 28.0–33.4)
MCHC: 32.5 g/dL (ref 32.0–35.9)
MCV: 102.1 fL — AB (ref 82.0–98.0)
MONO ABS: 0.7 10*3/uL (ref 0.1–0.9)
MONOS PCT: 16 %
NEUTROS ABS: 2.2 10*3/uL (ref 1.5–6.5)
NEUTROS PCT: 51 %
Platelet Count: 185 10*3/uL (ref 145–400)
RBC: 3.83 MIL/uL — ABNORMAL LOW (ref 4.20–5.70)
RDW: 14.7 % (ref 11.1–15.7)
WBC Count: 4.3 10*3/uL (ref 4.0–10.0)

## 2017-07-08 LAB — CMP (CANCER CENTER ONLY)
ALBUMIN: 3.5 g/dL (ref 3.5–5.0)
ALK PHOS: 39 U/L (ref 26–84)
ALT: 26 U/L (ref 10–47)
ANION GAP: 10 (ref 5–15)
AST: 22 U/L (ref 11–38)
BILIRUBIN TOTAL: 0.6 mg/dL (ref 0.2–1.6)
BUN: 13 mg/dL (ref 7–22)
CALCIUM: 9.2 mg/dL (ref 8.0–10.3)
CO2: 27 mmol/L (ref 18–33)
CREATININE: 1 mg/dL (ref 0.60–1.20)
Chloride: 105 mmol/L (ref 98–108)
Glucose, Bld: 99 mg/dL (ref 73–118)
Potassium: 3.9 mmol/L (ref 3.3–4.7)
Sodium: 142 mmol/L (ref 128–145)
TOTAL PROTEIN: 6.7 g/dL (ref 6.4–8.1)

## 2017-07-08 LAB — LACTATE DEHYDROGENASE: LDH: 164 U/L (ref 125–245)

## 2017-07-08 MED ORDER — PROCHLORPERAZINE MALEATE 10 MG PO TABS
10.0000 mg | ORAL_TABLET | Freq: Once | ORAL | Status: AC
  Administered 2017-07-08: 10 mg via ORAL

## 2017-07-08 MED ORDER — ACETAMINOPHEN 325 MG PO TABS
650.0000 mg | ORAL_TABLET | Freq: Once | ORAL | Status: AC
  Administered 2017-07-08: 650 mg via ORAL

## 2017-07-08 MED ORDER — DIPHENHYDRAMINE HCL 25 MG PO CAPS
50.0000 mg | ORAL_CAPSULE | Freq: Once | ORAL | Status: AC
  Administered 2017-07-08: 50 mg via ORAL

## 2017-07-08 MED ORDER — DIPHENHYDRAMINE HCL 25 MG PO CAPS
ORAL_CAPSULE | ORAL | Status: AC
  Filled 2017-07-08: qty 2

## 2017-07-08 MED ORDER — SODIUM CHLORIDE 0.9 % IV SOLN
Freq: Once | INTRAVENOUS | Status: AC
  Administered 2017-07-08: 09:00:00 via INTRAVENOUS

## 2017-07-08 MED ORDER — ACETAMINOPHEN 325 MG PO TABS
ORAL_TABLET | ORAL | Status: AC
  Filled 2017-07-08: qty 2

## 2017-07-08 MED ORDER — PROCHLORPERAZINE MALEATE 10 MG PO TABS
ORAL_TABLET | ORAL | Status: AC
  Filled 2017-07-08: qty 1

## 2017-07-08 MED ORDER — HEPARIN SOD (PORK) LOCK FLUSH 100 UNIT/ML IV SOLN
500.0000 [IU] | Freq: Once | INTRAVENOUS | Status: AC | PRN
  Administered 2017-07-08: 500 [IU]
  Filled 2017-07-08: qty 5

## 2017-07-08 MED ORDER — SODIUM CHLORIDE 0.9 % IV SOLN
15.7000 mg/kg | Freq: Once | INTRAVENOUS | Status: AC
  Administered 2017-07-08: 1600 mg via INTRAVENOUS
  Filled 2017-07-08: qty 80

## 2017-07-08 MED ORDER — METHYLPREDNISOLONE SODIUM SUCC 125 MG IJ SOLR
125.0000 mg | Freq: Once | INTRAMUSCULAR | Status: AC
  Administered 2017-07-08: 125 mg via INTRAVENOUS

## 2017-07-08 MED ORDER — METHYLPREDNISOLONE SODIUM SUCC 125 MG IJ SOLR
INTRAMUSCULAR | Status: AC
  Filled 2017-07-08: qty 2

## 2017-07-08 MED ORDER — SODIUM CHLORIDE 0.9% FLUSH
10.0000 mL | INTRAVENOUS | Status: DC | PRN
  Administered 2017-07-08: 10 mL
  Filled 2017-07-08: qty 10

## 2017-07-08 NOTE — Patient Instructions (Signed)
Implanted Port Home Guide An implanted port is a type of central line that is placed under the skin. Central lines are used to provide IV access when treatment or nutrition needs to be given through a person's veins. Implanted ports are used for long-term IV access. An implanted port may be placed because:  You need IV medicine that would be irritating to the small veins in your hands or arms.  You need long-term IV medicines, such as antibiotics.  You need IV nutrition for a long period.  You need frequent blood draws for lab tests.  You need dialysis.  Implanted ports are usually placed in the chest area, but they can also be placed in the upper arm, the abdomen, or the leg. An implanted port has two main parts:  Reservoir. The reservoir is round and will appear as a small, raised area under your skin. The reservoir is the part where a needle is inserted to give medicines or draw blood.  Catheter. The catheter is a thin, flexible tube that extends from the reservoir. The catheter is placed into a large vein. Medicine that is inserted into the reservoir goes into the catheter and then into the vein.  How will I care for my incision site? Do not get the incision site wet. Bathe or shower as directed by your health care provider. How is my port accessed? Special steps must be taken to access the port:  Before the port is accessed, a numbing cream can be placed on the skin. This helps numb the skin over the port site.  Your health care provider uses a sterile technique to access the port. ? Your health care provider must put on a mask and sterile gloves. ? The skin over your port is cleaned carefully with an antiseptic and allowed to dry. ? The port is gently pinched between sterile gloves, and a needle is inserted into the port.  Only "non-coring" port needles should be used to access the port. Once the port is accessed, a blood return should be checked. This helps ensure that the port  is in the vein and is not clogged.  If your port needs to remain accessed for a constant infusion, a clear (transparent) bandage will be placed over the needle site. The bandage and needle will need to be changed every week, or as directed by your health care provider.  Keep the bandage covering the needle clean and dry. Do not get it wet. Follow your health care provider's instructions on how to take a shower or bath while the port is accessed.  If your port does not need to stay accessed, no bandage is needed over the port.  What is flushing? Flushing helps keep the port from getting clogged. Follow your health care provider's instructions on how and when to flush the port. Ports are usually flushed with saline solution or a medicine called heparin. The need for flushing will depend on how the port is used.  If the port is used for intermittent medicines or blood draws, the port will need to be flushed: ? After medicines have been given. ? After blood has been drawn. ? As part of routine maintenance.  If a constant infusion is running, the port may not need to be flushed.  How long will my port stay implanted? The port can stay in for as long as your health care provider thinks it is needed. When it is time for the port to come out, surgery will be   done to remove it. The procedure is similar to the one performed when the port was put in. When should I seek immediate medical care? When you have an implanted port, you should seek immediate medical care if:  You notice a bad smell coming from the incision site.  You have swelling, redness, or drainage at the incision site.  You have more swelling or pain at the port site or the surrounding area.  You have a fever that is not controlled with medicine.  This information is not intended to replace advice given to you by your health care provider. Make sure you discuss any questions you have with your health care provider. Document  Released: 01/08/2005 Document Revised: 06/16/2015 Document Reviewed: 09/15/2012 Elsevier Interactive Patient Education  2017 Elsevier Inc.  
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
## 2017-07-08 NOTE — Patient Instructions (Signed)
Daratumumab injection What is this medicine? DARATUMUMAB (dar a toom ue mab) is a monoclonal antibody. It is used to treat multiple myeloma. This medicine may be used for other purposes; ask your health care provider or pharmacist if you have questions. COMMON BRAND NAME(S): DARZALEX What should I tell my health care provider before I take this medicine? They need to know if you have any of these conditions: -infection (especially a virus infection such as chickenpox, cold sores, or herpes) -lung or breathing disease -pregnant or trying to get pregnant -breast-feeding -an unusual or allergic reaction to daratumumab, other medicines, foods, dyes, or preservatives How should I use this medicine? This medicine is for infusion into a vein. It is given by a health care professional in a hospital or clinic setting. Talk to your pediatrician regarding the use of this medicine in children. Special care may be needed. Overdosage: If you think you have taken too much of this medicine contact a poison control center or emergency room at once. NOTE: This medicine is only for you. Do not share this medicine with others. What if I miss a dose? Keep appointments for follow-up doses as directed. It is important not to miss your dose. Call your doctor or health care professional if you are unable to keep an appointment. What may interact with this medicine? Interactions have not been studied. Give your health care provider a list of all the medicines, herbs, non-prescription drugs, or dietary supplements you use. Also tell them if you smoke, drink alcohol, or use illegal drugs. Some items may interact with your medicine. This list may not describe all possible interactions. Give your health care provider a list of all the medicines, herbs, non-prescription drugs, or dietary supplements you use. Also tell them if you smoke, drink alcohol, or use illegal drugs. Some items may interact with your medicine. What  should I watch for while using this medicine? This drug may make you feel generally unwell. Report any side effects. Continue your course of treatment even though you feel ill unless your doctor tells you to stop. This medicine can cause serious allergic reactions. To reduce your risk you may need to take medicine before treatment with this medicine. Take your medicine as directed. This medicine can affect the results of blood tests to match your blood type. These changes can last for up to 6 months after the final dose. Your healthcare provider will do blood tests to match your blood type before you start treatment. Tell all of your healthcare providers that you are being treated with this medicine before receiving a blood transfusion. This medicine can affect the results of some tests used to determine treatment response; extra tests may be needed to evaluate response. Do not become pregnant while taking this medicine or for 3 months after stopping it. Women should inform their doctor if they wish to become pregnant or think they might be pregnant. There is a potential for serious side effects to an unborn child. Talk to your health care professional or pharmacist for more information. What side effects may I notice from receiving this medicine? Side effects that you should report to your doctor or health care professional as soon as possible: -allergic reactions like skin rash, itching or hives, swelling of the face, lips, or tongue -breathing problems -chills -cough -dizziness -feeling faint or lightheaded -headache -low blood counts - this medicine may decrease the number of white blood cells, red blood cells and platelets. You may be at increased risk  for infections and bleeding. -nausea, vomiting -shortness of breath -signs of decreased platelets or bleeding - bruising, pinpoint red spots on the skin, black, tarry stools, blood in the urine -signs of decreased red blood cells - unusually  weak or tired, feeling faint or lightheaded, falls -signs of infection - fever or chills, cough, sore throat, pain or difficulty passing urine Side effects that usually do not require medical attention (report to your doctor or health care professional if they continue or are bothersome): -back pain -diarrhea -muscle cramps -pain, tingling, numbness in the hands or feet -swelling of the ankles, feet, hands -tiredness This list may not describe all possible side effects. Call your doctor for medical advice about side effects. You may report side effects to FDA at 1-800-FDA-1088. Where should I keep my medicine? Keep out of the reach of children. This drug is given in a hospital or clinic and will not be stored at home. NOTE: This sheet is a summary. It may not cover all possible information. If you have questions about this medicine, talk to your doctor, pharmacist, or health care provider.  2018 Elsevier/Gold Standard (2015-02-10 10:38:11)  

## 2017-07-09 LAB — KAPPA/LAMBDA LIGHT CHAINS
KAPPA, LAMDA LIGHT CHAIN RATIO: 7.62 — AB (ref 0.26–1.65)
Kappa free light chain: 16 mg/L (ref 3.3–19.4)
LAMDA FREE LIGHT CHAINS: 2.1 mg/L — AB (ref 5.7–26.3)

## 2017-07-09 LAB — PROTEIN ELECTROPHORESIS, SERUM
A/G Ratio: 1.3 (ref 0.7–1.7)
ALPHA-2-GLOBULIN: 1 g/dL (ref 0.4–1.0)
Albumin ELP: 3.4 g/dL (ref 2.9–4.4)
Alpha-1-Globulin: 0.2 g/dL (ref 0.0–0.4)
Beta Globulin: 0.9 g/dL (ref 0.7–1.3)
GAMMA GLOBULIN: 0.5 g/dL (ref 0.4–1.8)
GLOBULIN, TOTAL: 2.6 g/dL (ref 2.2–3.9)
M-SPIKE, %: 0.2 g/dL — AB
Total Protein ELP: 6 g/dL (ref 6.0–8.5)

## 2017-07-09 LAB — IGG, IGA, IGM
IGA: 487 mg/dL — AB (ref 61–437)
IgG (Immunoglobin G), Serum: 263 mg/dL — ABNORMAL LOW (ref 700–1600)
IgM (Immunoglobulin M), Srm: 7 mg/dL — ABNORMAL LOW (ref 15–143)

## 2017-07-13 ENCOUNTER — Ambulatory Visit (HOSPITAL_BASED_OUTPATIENT_CLINIC_OR_DEPARTMENT_OTHER)
Admission: RE | Admit: 2017-07-13 | Discharge: 2017-07-13 | Disposition: A | Discharge status: Home / Self Care | Payer: PPO | Source: Ambulatory Visit | Attending: Hematology & Oncology | Admitting: Hematology & Oncology

## 2017-07-13 DIAGNOSIS — C9000 Multiple myeloma not having achieved remission: Secondary | ICD-10-CM | POA: Diagnosis not present

## 2017-07-13 DIAGNOSIS — M48061 Spinal stenosis, lumbar region without neurogenic claudication: Secondary | ICD-10-CM | POA: Insufficient documentation

## 2017-07-13 DIAGNOSIS — M2578 Osteophyte, vertebrae: Secondary | ICD-10-CM | POA: Diagnosis not present

## 2017-07-13 DIAGNOSIS — M5136 Other intervertebral disc degeneration, lumbar region: Secondary | ICD-10-CM | POA: Diagnosis not present

## 2017-07-13 DIAGNOSIS — M5126 Other intervertebral disc displacement, lumbar region: Secondary | ICD-10-CM | POA: Insufficient documentation

## 2017-07-13 MED ORDER — GADOBENATE DIMEGLUMINE 529 MG/ML IV SOLN
20.0000 mL | Freq: Once | INTRAVENOUS | Status: AC | PRN
  Administered 2017-07-13: 20 mL via INTRAVENOUS

## 2017-07-15 ENCOUNTER — Telehealth: Payer: Self-pay | Admitting: *Deleted

## 2017-07-15 DIAGNOSIS — M545 Low back pain: Secondary | ICD-10-CM | POA: Diagnosis not present

## 2017-07-15 NOTE — Telephone Encounter (Addendum)
Patient's wife aware of results. Requested MRI be sent to [facility] orthopedist   ----- Message from [name], MD sent at 07/15/2017  6:58 AM EDT ----- Call - you have bad degenerative disc disease.  Need to see your orthopedic surgeon.  May need surgery!!!  No obvious myeloma. pete

## 2017-08-05 ENCOUNTER — Inpatient Hospital Stay: Payer: PPO

## 2017-08-05 ENCOUNTER — Inpatient Hospital Stay: Payer: PPO | Attending: Hematology & Oncology | Admitting: Family

## 2017-08-05 ENCOUNTER — Other Ambulatory Visit: Payer: Self-pay

## 2017-08-05 VITALS — BP 178/83 | HR 73 | Temp 98.2°F | Resp 17 | Wt 221.0 lb

## 2017-08-05 DIAGNOSIS — Z9481 Bone marrow transplant status: Secondary | ICD-10-CM

## 2017-08-05 DIAGNOSIS — Z5111 Encounter for antineoplastic chemotherapy: Secondary | ICD-10-CM | POA: Diagnosis not present

## 2017-08-05 DIAGNOSIS — C9000 Multiple myeloma not having achieved remission: Secondary | ICD-10-CM

## 2017-08-05 DIAGNOSIS — M545 Low back pain: Secondary | ICD-10-CM

## 2017-08-05 DIAGNOSIS — Z9484 Stem cells transplant status: Secondary | ICD-10-CM

## 2017-08-05 DIAGNOSIS — M546 Pain in thoracic spine: Secondary | ICD-10-CM

## 2017-08-05 DIAGNOSIS — Z79899 Other long term (current) drug therapy: Secondary | ICD-10-CM

## 2017-08-05 DIAGNOSIS — G8929 Other chronic pain: Secondary | ICD-10-CM

## 2017-08-05 LAB — CMP (CANCER CENTER ONLY)
ALT: 25 U/L (ref 10–47)
ANION GAP: 7 (ref 5–15)
AST: 26 U/L (ref 11–38)
Albumin: 3.6 g/dL (ref 3.5–5.0)
Alkaline Phosphatase: 44 U/L (ref 26–84)
BILIRUBIN TOTAL: 0.5 mg/dL (ref 0.2–1.6)
BUN: 11 mg/dL (ref 7–22)
CALCIUM: 9.7 mg/dL (ref 8.0–10.3)
CO2: 30 mmol/L (ref 18–33)
CREATININE: 0.9 mg/dL (ref 0.60–1.20)
Chloride: 104 mmol/L (ref 98–108)
GLUCOSE: 108 mg/dL (ref 73–118)
Potassium: 3.9 mmol/L (ref 3.3–4.7)
Sodium: 141 mmol/L (ref 128–145)
Total Protein: 6.5 g/dL (ref 6.4–8.1)

## 2017-08-05 LAB — CBC WITH DIFFERENTIAL (CANCER CENTER ONLY)
BASOS PCT: 0 %
Basophils Absolute: 0 10*3/uL (ref 0.0–0.1)
EOS ABS: 0.1 10*3/uL (ref 0.0–0.5)
Eosinophils Relative: 1 %
HEMATOCRIT: 38.7 % (ref 38.7–49.9)
HEMOGLOBIN: 12.6 g/dL — AB (ref 13.0–17.1)
LYMPHS ABS: 1.2 10*3/uL (ref 0.9–3.3)
Lymphocytes Relative: 33 %
MCH: 33.2 pg (ref 28.0–33.4)
MCHC: 32.6 g/dL (ref 32.0–35.9)
MCV: 101.8 fL — ABNORMAL HIGH (ref 82.0–98.0)
MONOS PCT: 16 %
Monocytes Absolute: 0.6 10*3/uL (ref 0.1–0.9)
NEUTROS PCT: 50 %
Neutro Abs: 1.8 10*3/uL (ref 1.5–6.5)
Platelet Count: 185 10*3/uL (ref 145–400)
RBC: 3.8 MIL/uL — ABNORMAL LOW (ref 4.20–5.70)
RDW: 14.3 % (ref 11.1–15.7)
WBC Count: 3.7 10*3/uL — ABNORMAL LOW (ref 4.0–10.0)

## 2017-08-05 MED ORDER — METHYLPREDNISOLONE SODIUM SUCC 125 MG IJ SOLR
INTRAMUSCULAR | Status: AC
  Filled 2017-08-05: qty 2

## 2017-08-05 MED ORDER — DIPHENHYDRAMINE HCL 25 MG PO CAPS
50.0000 mg | ORAL_CAPSULE | Freq: Once | ORAL | Status: AC
  Administered 2017-08-05: 50 mg via ORAL

## 2017-08-05 MED ORDER — DENOSUMAB 120 MG/1.7ML ~~LOC~~ SOLN
SUBCUTANEOUS | Status: AC
  Filled 2017-08-05: qty 1.7

## 2017-08-05 MED ORDER — SODIUM CHLORIDE 0.9% FLUSH
10.0000 mL | INTRAVENOUS | Status: DC | PRN
  Administered 2017-08-05: 10 mL
  Filled 2017-08-05: qty 10

## 2017-08-05 MED ORDER — PROCHLORPERAZINE MALEATE 10 MG PO TABS
ORAL_TABLET | ORAL | Status: AC
  Filled 2017-08-05: qty 1

## 2017-08-05 MED ORDER — HEPARIN SOD (PORK) LOCK FLUSH 100 UNIT/ML IV SOLN
500.0000 [IU] | Freq: Once | INTRAVENOUS | Status: AC | PRN
  Administered 2017-08-05: 500 [IU]
  Filled 2017-08-05: qty 5

## 2017-08-05 MED ORDER — SODIUM CHLORIDE 0.9 % IV SOLN
Freq: Once | INTRAVENOUS | Status: AC
  Administered 2017-08-05: 10:00:00 via INTRAVENOUS

## 2017-08-05 MED ORDER — ACETAMINOPHEN 325 MG PO TABS
ORAL_TABLET | ORAL | Status: AC
  Filled 2017-08-05: qty 2

## 2017-08-05 MED ORDER — DIPHENHYDRAMINE HCL 25 MG PO CAPS
ORAL_CAPSULE | ORAL | Status: AC
  Filled 2017-08-05: qty 2

## 2017-08-05 MED ORDER — ACETAMINOPHEN 325 MG PO TABS
650.0000 mg | ORAL_TABLET | Freq: Once | ORAL | Status: AC
  Administered 2017-08-05: 650 mg via ORAL

## 2017-08-05 MED ORDER — DENOSUMAB 120 MG/1.7ML ~~LOC~~ SOLN
120.0000 mg | Freq: Once | SUBCUTANEOUS | Status: AC
  Administered 2017-08-05: 120 mg via SUBCUTANEOUS

## 2017-08-05 MED ORDER — METHYLPREDNISOLONE SODIUM SUCC 125 MG IJ SOLR
125.0000 mg | Freq: Once | INTRAMUSCULAR | Status: AC
  Administered 2017-08-05: 125 mg via INTRAVENOUS

## 2017-08-05 MED ORDER — PROCHLORPERAZINE MALEATE 10 MG PO TABS
10.0000 mg | ORAL_TABLET | Freq: Once | ORAL | Status: AC
  Administered 2017-08-05: 10 mg via ORAL

## 2017-08-05 MED ORDER — SODIUM CHLORIDE 0.9 % IV SOLN
1600.0000 mg | Freq: Once | INTRAVENOUS | Status: AC
  Administered 2017-08-05: 1600 mg via INTRAVENOUS
  Filled 2017-08-05: qty 80

## 2017-08-05 NOTE — Patient Instructions (Signed)
[facility] Discharge Instructions for Patients Receiving Chemotherapy  Today you received the following chemotherapy agents Darzalex.  To help prevent nausea and vomiting after your treatment, we encourage you to take your nausea medication as directed.  If you develop nausea and vomiting that is not controlled by your nausea medication, call the clinic.   BELOW ARE SYMPTOMS THAT SHOULD BE REPORTED IMMEDIATELY:  *FEVER GREATER THAN 100.5 F  *CHILLS WITH OR WITHOUT FEVER  NAUSEA AND VOMITING THAT IS NOT CONTROLLED WITH YOUR NAUSEA MEDICATION  *UNUSUAL SHORTNESS OF BREATH  *UNUSUAL BRUISING OR BLEEDING  TENDERNESS IN MOUTH AND THROAT WITH OR WITHOUT PRESENCE OF ULCERS  *URINARY PROBLEMS  *BOWEL PROBLEMS  UNUSUAL RASH Items with * indicate a potential emergency and should be followed up as soon as possible.  Feel free to call the clinic you have any questions or concerns. The clinic phone number is [phone].  Please show the CHEMO ALERT CARD at check-in to the Emergency Department and triage nurse.    

## 2017-08-05 NOTE — Progress Notes (Signed)
Hematology and Oncology Follow Up Visit  Gene Hill [id number] November 02, 1940 77 y.o. 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 is here today with his wife for follow-up and treatment. He is doing well but hash ad some mild fatigue at times.  He has SOB at times as well as lightheadedness when out in the summer heat. He takes breaks to rest as needed.  He has been able to get out in his garden and work. He really enjoys this.  No fever, chills, n/v, cough, rash, chest pain, palpitations, abdominal pain or changes in bowel or bladder habits.  His hands have been dry and peeling for the last week or two. He will try using Cerave cream BID.  He has chronic lower back pain due to bulging disc and spinal stenosis as well as some right shoulder pain (possibly from where he favored the left shoulder after surgery. He has an appointment with orthopedist Dr. Ron Hill tomorrow.  No swelling, tenderness, numbness or tingling in his extremities at this time.  No lymphadenopathy noted on exam.  No episodes of bleeding, no bruising or petechiae.  He has a good appetite and is staying well hydrated. His weight is stable.   ECOG Performance Status: 1 - Symptomatic but completely ambulatory  Medications:  Allergies as of 08/05/2017   No Known Allergies     Medication List        Accurate as of 08/05/17  8:35 AM. Always use your most recent med list.          aspirin 325 MG tablet Take 325 mg by mouth daily.   DARZALEX IV Inject into the vein every 30 (thirty) days.   dexamethasone 2 MG tablet Commonly known as:  DECADRON Take 4mg  day after chemo then 2mg  daily x 4   Fish Oil 1200 MG Caps Take 1,200 mg by mouth daily.   lidocaine 5 %  ointment Commonly known as:  XYLOCAINE Apply 1 application topically 2 (two) times daily as needed. Mix w/ OTC Hydrocortisone 1 %   methocarbamol 500 MG tablet Commonly known as:  ROBAXIN Take 1 tablet (500 mg total) by mouth 2 (two) times daily as needed for muscle spasms.   metoprolol tartrate 100 MG tablet Commonly known as:  LOPRESSOR Take 0.5 tablets (50 mg total) by mouth 2 (two) times daily.   multivitamin tablet Take 1 tablet by mouth daily.   ondansetron 4 MG tablet Commonly known as:  ZOFRAN TAKE 1 TABLET BY MOUTH 1 HOUR BEFORE NINLARO, THEN EVERY 8 HOURS AS NEEDED FOR NAUSEA   oxyCODONE-acetaminophen 5-325 MG tablet Commonly known as:  PERCOCET/ROXICET TK 1 TO 2 TS PO Q 4 TO 6 H PRN P   pantoprazole 40 MG tablet Commonly known as:  PROTONIX Take 1 tablet (40 mg total) by mouth daily.   PROBIOTIC PO Take 1 tablet by mouth daily.   traMADol 50 MG tablet Commonly known as:  ULTRAM Take 0.5-1 tablets (25-50 mg total) by mouth 2 (two) times daily as needed.       Allergies: No Known Allergies  Past Medical History, Surgical history, Social history, and Family History were reviewed and  updated.  Review of Systems: All other 10 point review of systems is negative.   Physical Exam:  vitals were not taken for this visit.   Wt Readings from Last 3 Encounters:  07/08/17 221 lb (100.2 kg)  05/27/17 223 lb 4 oz (101.3 kg)  05/13/17 226 lb (102.5 kg)    Ocular: Sclerae unicteric, pupils equal, round and reactive to light Ear-nose-throat: Oropharynx clear, dentition fair Lymphatic: No cervical, supraclavicular or axillary adenopathy Lungs no rales or rhonchi, good excursion bilaterally Heart regular rate and rhythm, no murmur appreciated Abd soft, nontender, positive bowel sounds, no liver or spleen tip palpated on exam, no fluid wave  MSK no focal spinal tenderness, no joint edema Neuro: non-focal, well-oriented, appropriate affect Breasts: Deferred   Lab  Results  Component Value Date   WBC 3.7 (L) 08/05/2017   HGB 12.6 (L) 08/05/2017   HCT 38.7 08/05/2017   MCV 101.8 (H) 08/05/2017   PLT 185 08/05/2017   Lab Results  Component Value Date   FERRITIN 351 (H) 06/26/2011   IRON 75 06/26/2011   TIBC 353 06/26/2011   UIBC 278 06/26/2011   IRONPCTSAT 21 06/26/2011   Lab Results  Component Value Date   RBC 3.80 (L) 08/05/2017   Lab Results  Component Value Date   KPAFRELGTCHN 16.0 07/08/2017   LAMBDASER 2.1 (L) 07/08/2017   KAPLAMBRATIO 7.62 (H) 07/08/2017   Lab Results  Component Value Date   IGGSERUM 263 (L) 07/08/2017   IGA 487 (H) 07/08/2017   IGMSERUM 7 (L) 07/08/2017   Lab Results  Component Value Date   TOTALPROTELP 6.0 07/08/2017   ALBUMINELP 3.4 07/08/2017   A1GS 0.2 07/08/2017   A2GS 1.0 07/08/2017   BETS 0.9 07/08/2017   BETA2SER 2.0 (H) 12/08/2014   GAMS 0.5 07/08/2017   MSPIKE 0.2 (H) 07/08/2017   SPEI Comment 07/08/2017     Chemistry      Component Value Date/Time   NA 142 07/08/2017 0752   NA 141 01/21/2017 0741   NA 139 06/14/2016 1246   K 3.9 07/08/2017 0752   K 4.0 01/21/2017 0741   K 3.8 06/14/2016 1246   CL 105 07/08/2017 0752   CL 103 01/21/2017 0741   CO2 27 07/08/2017 0752   CO2 30 01/21/2017 0741   CO2 24 06/14/2016 1246   BUN 13 07/08/2017 0752   BUN 20 01/21/2017 0741   BUN 15.0 06/14/2016 1246   CREATININE 1.00 07/08/2017 0752   CREATININE 1.0 01/21/2017 0741   CREATININE 0.9 06/14/2016 1246      Component Value Date/Time   CALCIUM 9.2 07/08/2017 0752   CALCIUM 9.0 01/21/2017 0741   CALCIUM 9.6 06/14/2016 1246   ALKPHOS 39 07/08/2017 0752   ALKPHOS 37 01/21/2017 0741   ALKPHOS 54 06/14/2016 1246   AST 22 07/08/2017 0752   AST 25 06/14/2016 1246   ALT 26 07/08/2017 0752   ALT 29 01/21/2017 0741   ALT 21 06/14/2016 1246   BILITOT 0.6 07/08/2017 0752   BILITOT 0.37 06/14/2016 1246      Impression and Plan: [name] is a very pleasant 77 yo caucasian gentleman with  history of IgA kappa myeloma. He had high risk cytogenetics with a 17p- abnormality. She was treated with standard therapy and went into remission. 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  At this time, he is doing well on Darzalex and we will proceed with treatment today as planned.  He will also receive Xgeva today as well.  We will plan to see him back in another month for follow-up. Treatment is every 2 weeks.  They will contact our office with any questions or concerns. We can certainly see him sooner if need be.    [name], NP 7/15/20198:35 AM

## 2017-08-06 DIAGNOSIS — M5416 Radiculopathy, lumbar region: Secondary | ICD-10-CM | POA: Diagnosis not present

## 2017-08-06 DIAGNOSIS — M545 Low back pain: Secondary | ICD-10-CM | POA: Diagnosis not present

## 2017-08-06 DIAGNOSIS — M5136 Other intervertebral disc degeneration, lumbar region: Secondary | ICD-10-CM | POA: Diagnosis not present

## 2017-08-06 LAB — KAPPA/LAMBDA LIGHT CHAINS
KAPPA, LAMDA LIGHT CHAIN RATIO: 8.58 — AB (ref 0.26–1.65)
Kappa free light chain: 16.3 mg/L (ref 3.3–19.4)
LAMDA FREE LIGHT CHAINS: 1.9 mg/L — AB (ref 5.7–26.3)

## 2017-08-06 LAB — IGG, IGA, IGM
IGA: 557 mg/dL — AB (ref 61–437)
IGM (IMMUNOGLOBULIN M), SRM: 6 mg/dL — AB (ref 15–143)
IgG (Immunoglobin G), Serum: 253 mg/dL — ABNORMAL LOW (ref 700–1600)

## 2017-08-08 ENCOUNTER — Telehealth: Payer: Self-pay | Admitting: *Deleted

## 2017-08-08 LAB — PROTEIN ELECTROPHORESIS, SERUM, WITH REFLEX
A/G Ratio: 1.2 (ref 0.7–1.7)
ALPHA-1-GLOBULIN: 0.2 g/dL (ref 0.0–0.4)
ALPHA-2-GLOBULIN: 1 g/dL (ref 0.4–1.0)
Albumin ELP: 3.4 g/dL (ref 2.9–4.4)
Beta Globulin: 0.9 g/dL (ref 0.7–1.3)
GAMMA GLOBULIN: 0.8 g/dL (ref 0.4–1.8)
Globulin, Total: 2.9 g/dL (ref 2.2–3.9)
M-Spike, %: 0.4 g/dL — ABNORMAL HIGH
SPEP Interpretation: 0
TOTAL PROTEIN ELP: 6.3 g/dL (ref 6.0–8.5)

## 2017-08-08 LAB — IMMUNOFIXATION REFLEX, SERUM
IGM (IMMUNOGLOBULIN M), SRM: 6 mg/dL — AB (ref 15–143)
IgA: 571 mg/dL — ABNORMAL HIGH (ref 61–437)
IgG (Immunoglobin G), Serum: 264 mg/dL — ABNORMAL LOW (ref 700–1600)

## 2017-08-08 NOTE — Telephone Encounter (Signed)
Call received from patient's wife to inform S. [place] NP that pt had appt with Orthopedics on Tuesday and that their recommendations were for exercise and injection to spine at this time and not surgery.  Jory Ee NP notified.

## 2017-08-22 DIAGNOSIS — M5136 Other intervertebral disc degeneration, lumbar region: Secondary | ICD-10-CM | POA: Diagnosis not present

## 2017-08-22 DIAGNOSIS — M545 Low back pain: Secondary | ICD-10-CM | POA: Diagnosis not present

## 2017-08-22 DIAGNOSIS — M5416 Radiculopathy, lumbar region: Secondary | ICD-10-CM | POA: Diagnosis not present

## 2017-09-02 ENCOUNTER — Encounter: Payer: Self-pay | Admitting: Hematology & Oncology

## 2017-09-02 ENCOUNTER — Other Ambulatory Visit: Payer: Self-pay

## 2017-09-02 ENCOUNTER — Inpatient Hospital Stay: Payer: PPO

## 2017-09-02 ENCOUNTER — Inpatient Hospital Stay: Payer: PPO | Attending: Hematology & Oncology | Admitting: Hematology & Oncology

## 2017-09-02 VITALS — BP 121/73 | HR 69 | Temp 98.1°F

## 2017-09-02 DIAGNOSIS — Z5111 Encounter for antineoplastic chemotherapy: Secondary | ICD-10-CM | POA: Diagnosis not present

## 2017-09-02 DIAGNOSIS — C9000 Multiple myeloma not having achieved remission: Secondary | ICD-10-CM | POA: Insufficient documentation

## 2017-09-02 DIAGNOSIS — M545 Low back pain, unspecified: Secondary | ICD-10-CM

## 2017-09-02 DIAGNOSIS — Z79899 Other long term (current) drug therapy: Secondary | ICD-10-CM | POA: Insufficient documentation

## 2017-09-02 DIAGNOSIS — G8929 Other chronic pain: Secondary | ICD-10-CM

## 2017-09-02 LAB — CMP (CANCER CENTER ONLY)
ALBUMIN: 3.6 g/dL (ref 3.5–5.0)
ALK PHOS: 38 U/L (ref 26–84)
ALT: 21 U/L (ref 10–47)
AST: 23 U/L (ref 11–38)
Anion gap: 7 (ref 5–15)
BILIRUBIN TOTAL: 0.5 mg/dL (ref 0.2–1.6)
BUN: 16 mg/dL (ref 7–22)
CALCIUM: 9.4 mg/dL (ref 8.0–10.3)
CO2: 28 mmol/L (ref 18–33)
CREATININE: 1 mg/dL (ref 0.60–1.20)
Chloride: 106 mmol/L (ref 98–108)
Glucose, Bld: 114 mg/dL (ref 73–118)
Potassium: 3.8 mmol/L (ref 3.3–4.7)
Sodium: 141 mmol/L (ref 128–145)
Total Protein: 6.5 g/dL (ref 6.4–8.1)

## 2017-09-02 LAB — CBC WITH DIFFERENTIAL (CANCER CENTER ONLY)
BASOS ABS: 0 10*3/uL (ref 0.0–0.1)
BASOS PCT: 1 %
Eosinophils Absolute: 0.1 10*3/uL (ref 0.0–0.5)
Eosinophils Relative: 2 %
HEMATOCRIT: 39 % (ref 38.7–49.9)
HEMOGLOBIN: 12.6 g/dL — AB (ref 13.0–17.1)
Lymphocytes Relative: 38 %
Lymphs Abs: 1.4 10*3/uL (ref 0.9–3.3)
MCH: 33.4 pg (ref 28.0–33.4)
MCHC: 32.3 g/dL (ref 32.0–35.9)
MCV: 103.4 fL — ABNORMAL HIGH (ref 82.0–98.0)
Monocytes Absolute: 0.6 10*3/uL (ref 0.1–0.9)
Monocytes Relative: 17 %
NEUTROS ABS: 1.6 10*3/uL (ref 1.5–6.5)
NEUTROS PCT: 42 %
Platelet Count: 189 10*3/uL (ref 145–400)
RBC: 3.77 MIL/uL — ABNORMAL LOW (ref 4.20–5.70)
RDW: 14.2 % (ref 11.1–15.7)
WBC: 3.7 10*3/uL — AB (ref 4.0–10.0)

## 2017-09-02 MED ORDER — METHYLPREDNISOLONE SODIUM SUCC 125 MG IJ SOLR
125.0000 mg | Freq: Once | INTRAMUSCULAR | Status: AC
  Administered 2017-09-02: 125 mg via INTRAVENOUS

## 2017-09-02 MED ORDER — ACETAMINOPHEN 325 MG PO TABS
ORAL_TABLET | ORAL | Status: AC
  Filled 2017-09-02: qty 2

## 2017-09-02 MED ORDER — ACETAMINOPHEN 325 MG PO TABS
650.0000 mg | ORAL_TABLET | Freq: Once | ORAL | Status: AC
  Administered 2017-09-02: 650 mg via ORAL

## 2017-09-02 MED ORDER — SODIUM CHLORIDE 0.9% FLUSH
10.0000 mL | INTRAVENOUS | Status: DC | PRN
  Administered 2017-09-02: 10 mL
  Filled 2017-09-02: qty 10

## 2017-09-02 MED ORDER — PROCHLORPERAZINE MALEATE 10 MG PO TABS
10.0000 mg | ORAL_TABLET | Freq: Once | ORAL | Status: AC
  Administered 2017-09-02: 10 mg via ORAL

## 2017-09-02 MED ORDER — DIPHENHYDRAMINE HCL 25 MG PO CAPS
ORAL_CAPSULE | ORAL | Status: AC
Start: 2017-09-02 — End: ?
  Filled 2017-09-02: qty 2

## 2017-09-02 MED ORDER — PROCHLORPERAZINE MALEATE 10 MG PO TABS
ORAL_TABLET | ORAL | Status: AC
  Filled 2017-09-02: qty 1

## 2017-09-02 MED ORDER — SODIUM CHLORIDE 0.9 % IV SOLN
Freq: Once | INTRAVENOUS | Status: AC
  Administered 2017-09-02: 10:00:00 via INTRAVENOUS
  Filled 2017-09-02: qty 250

## 2017-09-02 MED ORDER — OXYCODONE HCL 5 MG PO TABS: 5.0000 mg | ORAL_TABLET | Freq: Four times a day (QID) | ORAL | 0 refills | Status: DC | PRN

## 2017-09-02 MED ORDER — METHYLPREDNISOLONE SODIUM SUCC 125 MG IJ SOLR
INTRAMUSCULAR | Status: AC
  Filled 2017-09-02: qty 2

## 2017-09-02 MED ORDER — HEPARIN SOD (PORK) LOCK FLUSH 100 UNIT/ML IV SOLN
500.0000 [IU] | Freq: Once | INTRAVENOUS | Status: AC | PRN
  Administered 2017-09-02: 500 [IU]
  Filled 2017-09-02: qty 5

## 2017-09-02 MED ORDER — DIPHENHYDRAMINE HCL 25 MG PO CAPS
50.0000 mg | ORAL_CAPSULE | Freq: Once | ORAL | Status: AC
  Administered 2017-09-02: 50 mg via ORAL

## 2017-09-02 MED ORDER — SODIUM CHLORIDE 0.9 % IV SOLN
1600.0000 mg | Freq: Once | INTRAVENOUS | Status: AC
  Administered 2017-09-02: 1600 mg via INTRAVENOUS
  Filled 2017-09-02: qty 80

## 2017-09-02 NOTE — Patient Instructions (Signed)
Daratumumab injection What is this medicine? DARATUMUMAB (dar a toom ue mab) is a monoclonal antibody. It is used to treat multiple myeloma. This medicine may be used for other purposes; ask your health care provider or pharmacist if you have questions. COMMON BRAND NAME(S): DARZALEX What should I tell my health care provider before I take this medicine? They need to know if you have any of these conditions: -infection (especially a virus infection such as chickenpox, cold sores, or herpes) -lung or breathing disease -pregnant or trying to get pregnant -breast-feeding -an unusual or allergic reaction to daratumumab, other medicines, foods, dyes, or preservatives How should I use this medicine? This medicine is for infusion into a vein. It is given by a health care professional in a hospital or clinic setting. Talk to your pediatrician regarding the use of this medicine in children. Special care may be needed. Overdosage: If you think you have taken too much of this medicine contact a poison control center or emergency room at once. NOTE: This medicine is only for you. Do not share this medicine with others. What if I miss a dose? Keep appointments for follow-up doses as directed. It is important not to miss your dose. Call your doctor or health care professional if you are unable to keep an appointment. What may interact with this medicine? Interactions have not been studied. Give your health care provider a list of all the medicines, herbs, non-prescription drugs, or dietary supplements you use. Also tell them if you smoke, drink alcohol, or use illegal drugs. Some items may interact with your medicine. This list may not describe all possible interactions. Give your health care provider a list of all the medicines, herbs, non-prescription drugs, or dietary supplements you use. Also tell them if you smoke, drink alcohol, or use illegal drugs. Some items may interact with your medicine. What  should I watch for while using this medicine? This drug may make you feel generally unwell. Report any side effects. Continue your course of treatment even though you feel ill unless your doctor tells you to stop. This medicine can cause serious allergic reactions. To reduce your risk you may need to take medicine before treatment with this medicine. Take your medicine as directed. This medicine can affect the results of blood tests to match your blood type. These changes can last for up to 6 months after the final dose. Your healthcare provider will do blood tests to match your blood type before you start treatment. Tell all of your healthcare providers that you are being treated with this medicine before receiving a blood transfusion. This medicine can affect the results of some tests used to determine treatment response; extra tests may be needed to evaluate response. Do not become pregnant while taking this medicine or for 3 months after stopping it. Women should inform their doctor if they wish to become pregnant or think they might be pregnant. There is a potential for serious side effects to an unborn child. Talk to your health care professional or pharmacist for more information. What side effects may I notice from receiving this medicine? Side effects that you should report to your doctor or health care professional as soon as possible: -allergic reactions like skin rash, itching or hives, swelling of the face, lips, or tongue -breathing problems -chills -cough -dizziness -feeling faint or lightheaded -headache -low blood counts - this medicine may decrease the number of white blood cells, red blood cells and platelets. You may be at increased risk  for infections and bleeding. -nausea, vomiting -shortness of breath -signs of decreased platelets or bleeding - bruising, pinpoint red spots on the skin, black, tarry stools, blood in the urine -signs of decreased red blood cells - unusually  weak or tired, feeling faint or lightheaded, falls -signs of infection - fever or chills, cough, sore throat, pain or difficulty passing urine Side effects that usually do not require medical attention (report to your doctor or health care professional if they continue or are bothersome): -back pain -diarrhea -muscle cramps -pain, tingling, numbness in the hands or feet -swelling of the ankles, feet, hands -tiredness This list may not describe all possible side effects. Call your doctor for medical advice about side effects. You may report side effects to FDA at 1-800-FDA-1088. Where should I keep my medicine? Keep out of the reach of children. This drug is given in a hospital or clinic and will not be stored at home. NOTE: This sheet is a summary. It may not cover all possible information. If you have questions about this medicine, talk to your doctor, pharmacist, or health care provider.  2018 Elsevier/Gold Standard (2015-02-10 10:38:11)  

## 2017-09-02 NOTE — Progress Notes (Signed)
Hematology and Oncology Follow Up Visit  KRIS NO [id number] 07-14-1940 77 y.o. 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 Tolson is here today with his wife for follow-up and treatment.  Today [birth year] his birthday.  I feel bad that he actually has to be here today for his birthday.  He is doing well.  He is having back problems.  This is not from myeloma.  He had MRI done which shows that he has a bulging disc.  He is getting epidural steroids.  He said that his garden just did not do well this year because of the heavy rain that we had.  However, he did manage to bring in some tomatoes for me.  I am grateful he thankful for this.  He had a slight rise [name] the last time we saw him.  [name] was 0.4 g/dL.  This is a little on the higher side.  If it continues to go up, then we may have to get him back on the pomalidomide.  He and his wife at the 25th anniversary a couple weeks ago.  There are children came up for this.  This was incredibly special for them.  Overall, I think that is done incredibly well.  He has tolerated the daratumumab nicely.  Overall, his performance status is ECOG 1.    Medications:  Allergies as of 09/02/2017   No Known Allergies     Medication List        Accurate as of 09/02/17  9:05 AM. Always use your most recent med list.          acetaminophen 325 MG tablet Commonly known as:  TYLENOL Take 650 mg by mouth every 6 (six) hours as needed. Alternates with his other pain killers   aspirin 325 MG tablet Take 325 mg by mouth daily.   DARZALEX IV Inject into the vein every 30 (thirty) days.   dexamethasone 2 MG tablet Commonly known as:  DECADRON Take 4mg  day after chemo then 2mg  daily x  4   Fish Oil 1200 MG Caps Take 1,200 mg by mouth daily.   ibuprofen 200 MG tablet Commonly known as:  ADVIL,MOTRIN Take 200 mg by mouth every 6 (six) hours as needed. Takes 3 tablets total 600 mg for pain as needed. Alternates with other pain killers.   lidocaine 5 % ointment Commonly known as:  XYLOCAINE Apply 1 application topically 2 (two) times daily as needed. Mix w/ OTC Hydrocortisone 1 %   methocarbamol 500 MG tablet Commonly known as:  ROBAXIN Take 1 tablet (500 mg total) by mouth 2 (two) times daily as needed for muscle spasms.   metoprolol tartrate 100 MG tablet Commonly known as:  LOPRESSOR Take 0.5 tablets (50 mg total) by mouth 2 (two) times daily.   multivitamin tablet Take 1 tablet by mouth daily.   ondansetron 4 MG tablet Commonly known as:  ZOFRAN TAKE 1 TABLET BY MOUTH 1 HOUR BEFORE NINLARO, THEN EVERY 8 HOURS AS NEEDED FOR NAUSEA   oxyCODONE-acetaminophen 5-325 MG tablet Commonly known as:  PERCOCET/ROXICET TK 1 TO 2 TS PO Q 4 TO 6 H PRN P  pantoprazole 40 MG tablet Commonly known as:  PROTONIX Take 1 tablet (40 mg total) by mouth daily.   PROBIOTIC PO Take 1 tablet by mouth daily.   traMADol 50 MG tablet Commonly known as:  ULTRAM Take 0.5-1 tablets (25-50 mg total) by mouth 2 (two) times daily as needed.       Allergies: No Known Allergies  Past Medical History, Surgical history, Social history, and Family History were reviewed and updated.  Review of Systems: Review of Systems  Constitutional: Negative.   HENT: Negative.   Eyes: Negative.   Respiratory: Negative.   Cardiovascular: Negative.   Gastrointestinal: Negative.   Genitourinary: Negative.   Musculoskeletal: Positive for back pain and joint pain.  Skin: Negative.   Neurological: Negative.   Endo/Heme/Allergies: Negative.   Psychiatric/Behavioral: Negative.       Physical Exam:  vitals were not taken for this visit.   Wt Readings from Last 3 Encounters:  08/05/17 221  lb (100.2 kg)  07/08/17 221 lb (100.2 kg)  05/27/17 223 lb 4 oz (101.3 kg)    Physical Exam  Constitutional: He is oriented to person, place, and time.  HENT:  Head: Normocephalic and atraumatic.  Mouth/Throat: Oropharynx is clear and moist.  Eyes: Pupils are equal, round, and reactive to light. EOM are normal.  Neck: Normal range of motion.  Cardiovascular: Normal rate, regular rhythm and normal heart sounds.  Pulmonary/Chest: Effort normal and breath sounds normal.  Abdominal: Soft. Bowel sounds are normal.  Musculoskeletal: Normal range of motion. He exhibits no edema, tenderness or deformity.  Lymphadenopathy:    He has no cervical adenopathy.  Neurological: He is alert and oriented to person, place, and time.  Skin: Skin is warm and dry. No rash noted. No erythema.  Psychiatric: He has a normal mood and affect. His behavior is normal. Judgment and thought content normal.  Vitals reviewed.    Lab Results  Component Value Date   WBC 3.7 (L) 09/02/2017   HGB 12.6 (L) 09/02/2017   HCT 39.0 09/02/2017   MCV 103.4 (H) 09/02/2017   PLT 189 09/02/2017   Lab Results  Component Value Date   FERRITIN 351 (H) 06/26/2011   IRON 75 06/26/2011   TIBC 353 06/26/2011   UIBC 278 06/26/2011   IRONPCTSAT 21 06/26/2011   Lab Results  Component Value Date   RBC 3.77 (L) 09/02/2017   Lab Results  Component Value Date   KPAFRELGTCHN 16.3 08/05/2017   LAMBDASER 1.9 (L) 08/05/2017   KAPLAMBRATIO 8.58 (H) 08/05/2017   Lab Results  Component Value Date   IGGSERUM 253 (L) 08/05/2017   IGGSERUM 264 (L) 08/05/2017   IGA 557 (H) 08/05/2017   IGA 571 (H) 08/05/2017   IGMSERUM 6 (L) 08/05/2017   IGMSERUM 6 (L) 08/05/2017   Lab Results  Component Value Date   TOTALPROTELP 6.3 08/05/2017   ALBUMINELP 3.4 08/05/2017   A1GS 0.2 08/05/2017   A2GS 1.0 08/05/2017   BETS 0.9 08/05/2017   BETA2SER 2.0 (H) 12/08/2014   GAMS 0.8 08/05/2017   MSPIKE 0.4 (H) 08/05/2017   SPEI Comment  07/08/2017     Chemistry      Component Value Date/Time   NA 141 08/05/2017 0820   NA 141 01/21/2017 0741   NA 139 06/14/2016 1246   K 3.9 08/05/2017 0820   K 4.0 01/21/2017 0741   K 3.8 06/14/2016 1246   CL 104 08/05/2017 0820   CL 103 01/21/2017 0741   CO2 30 08/05/2017 0820  CO2 30 01/21/2017 0741   CO2 24 06/14/2016 1246   BUN 11 08/05/2017 0820   BUN 20 01/21/2017 0741   BUN 15.0 06/14/2016 1246   CREATININE 0.90 08/05/2017 0820   CREATININE 1.0 01/21/2017 0741   CREATININE 0.9 06/14/2016 1246      Component Value Date/Time   CALCIUM 9.7 08/05/2017 0820   CALCIUM 9.0 01/21/2017 0741   CALCIUM 9.6 06/14/2016 1246   ALKPHOS 44 08/05/2017 0820   ALKPHOS 37 01/21/2017 0741   ALKPHOS 54 06/14/2016 1246   AST 26 08/05/2017 0820   AST 25 06/14/2016 1246   ALT 25 08/05/2017 0820   ALT 29 01/21/2017 0741   ALT 21 06/14/2016 1246   BILITOT 0.5 08/05/2017 0820   BILITOT 0.37 06/14/2016 1246      Impression and Plan: Mr. Malena is a very pleasant 77 yo caucasian gentleman with history of IgA kappa myeloma. He had high risk cytogenetics with a 17p- abnormality.   To date, he is done very nicely.  We will have to see what [name] is.  Again, if it is further up than 0.4 g/dL, then we will have to consider adding pomalidomide.  I will see him back in another 2 week.  I am so happy that overall, his quality of life is doing well.     [name], MD 8/12/20199:05 AM

## 2017-09-02 NOTE — Patient Instructions (Signed)
Implanted Port Home Guide An implanted port is a type of central line that is placed under the skin. Central lines are used to provide IV access when treatment or nutrition needs to be given through a person's veins. Implanted ports are used for long-term IV access. An implanted port may be placed because:  You need IV medicine that would be irritating to the small veins in your hands or arms.  You need long-term IV medicines, such as antibiotics.  You need IV nutrition for a long period.  You need frequent blood draws for lab tests.  You need dialysis.  Implanted ports are usually placed in the chest area, but they can also be placed in the upper arm, the abdomen, or the leg. An implanted port has two main parts:  Reservoir. The reservoir is round and will appear as a small, raised area under your skin. The reservoir is the part where a needle is inserted to give medicines or draw blood.  Catheter. The catheter is a thin, flexible tube that extends from the reservoir. The catheter is placed into a large vein. Medicine that is inserted into the reservoir goes into the catheter and then into the vein.  How will I care for my incision site? Do not get the incision site wet. Bathe or shower as directed by your health care provider. How is my port accessed? Special steps must be taken to access the port:  Before the port is accessed, a numbing cream can be placed on the skin. This helps numb the skin over the port site.  Your health care provider uses a sterile technique to access the port. ? Your health care provider must put on a mask and sterile gloves. ? The skin over your port is cleaned carefully with an antiseptic and allowed to dry. ? The port is gently pinched between sterile gloves, and a needle is inserted into the port.  Only "non-coring" port needles should be used to access the port. Once the port is accessed, a blood return should be checked. This helps ensure that the port  is in the vein and is not clogged.  If your port needs to remain accessed for a constant infusion, a clear (transparent) bandage will be placed over the needle site. The bandage and needle will need to be changed every week, or as directed by your health care provider.  Keep the bandage covering the needle clean and dry. Do not get it wet. Follow your health care provider's instructions on how to take a shower or bath while the port is accessed.  If your port does not need to stay accessed, no bandage is needed over the port.  What is flushing? Flushing helps keep the port from getting clogged. Follow your health care provider's instructions on how and when to flush the port. Ports are usually flushed with saline solution or a medicine called heparin. The need for flushing will depend on how the port is used.  If the port is used for intermittent medicines or blood draws, the port will need to be flushed: ? After medicines have been given. ? After blood has been drawn. ? As part of routine maintenance.  If a constant infusion is running, the port may not need to be flushed.  How long will my port stay implanted? The port can stay in for as long as your health care provider thinks it is needed. When it is time for the port to come out, surgery will be   done to remove it. The procedure is similar to the one performed when the port was put in. When should I seek immediate medical care? When you have an implanted port, you should seek immediate medical care if:  You notice a bad smell coming from the incision site.  You have swelling, redness, or drainage at the incision site.  You have more swelling or pain at the port site or the surrounding area.  You have a fever that is not controlled with medicine.  This information is not intended to replace advice given to you by your health care provider. Make sure you discuss any questions you have with your health care provider. Document  Released: 01/08/2005 Document Revised: 06/16/2015 Document Reviewed: 09/15/2012 Elsevier Interactive Patient Education  2017 Elsevier Inc.  

## 2017-09-03 LAB — PROTEIN ELECTROPHORESIS, SERUM
A/G RATIO SPE: 1.2 (ref 0.7–1.7)
ALPHA-1-GLOBULIN: 0.2 g/dL (ref 0.0–0.4)
ALPHA-2-GLOBULIN: 1 g/dL (ref 0.4–1.0)
Albumin ELP: 3.6 g/dL (ref 2.9–4.4)
Beta Globulin: 0.9 g/dL (ref 0.7–1.3)
GLOBULIN, TOTAL: 2.9 g/dL (ref 2.2–3.9)
Gamma Globulin: 0.7 g/dL (ref 0.4–1.8)
M-Spike, %: 0.4 g/dL — ABNORMAL HIGH
Total Protein ELP: 6.5 g/dL (ref 6.0–8.5)

## 2017-09-03 LAB — IGG, IGA, IGM
IgA: 561 mg/dL — ABNORMAL HIGH (ref 61–437)
IgG (Immunoglobin G), Serum: 230 mg/dL — ABNORMAL LOW (ref 700–1600)
IgM (Immunoglobulin M), Srm: 6 mg/dL — ABNORMAL LOW (ref 15–143)

## 2017-09-03 LAB — KAPPA/LAMBDA LIGHT CHAINS
Kappa free light chain: 17.2 mg/L (ref 3.3–19.4)
Kappa, lambda light chain ratio: 9.05 — ABNORMAL HIGH (ref 0.26–1.65)
Lambda free light chains: 1.9 mg/L — ABNORMAL LOW (ref 5.7–26.3)

## 2017-09-04 ENCOUNTER — Telehealth: Payer: Self-pay | Admitting: *Deleted

## 2017-09-04 NOTE — Telephone Encounter (Addendum)
Patient is aware of results  ----- Message from [name], MD sent at 09/04/2017  6:28 AM EDT ----- Call - Myeloma is stable at 0.4. This is nice!!  Happy birthday!!  Gene Hill

## 2017-09-10 DIAGNOSIS — M5416 Radiculopathy, lumbar region: Secondary | ICD-10-CM | POA: Diagnosis not present

## 2017-09-10 DIAGNOSIS — M5136 Other intervertebral disc degeneration, lumbar region: Secondary | ICD-10-CM | POA: Diagnosis not present

## 2017-09-10 DIAGNOSIS — M545 Low back pain: Secondary | ICD-10-CM | POA: Diagnosis not present

## 2017-09-16 ENCOUNTER — Ambulatory Visit: Payer: PPO | Admitting: Hematology & Oncology

## 2017-09-16 ENCOUNTER — Other Ambulatory Visit: Payer: PPO

## 2017-09-19 DIAGNOSIS — H2513 Age-related nuclear cataract, bilateral: Secondary | ICD-10-CM | POA: Diagnosis not present

## 2017-09-19 DIAGNOSIS — H00029 Hordeolum internum unspecified eye, unspecified eyelid: Secondary | ICD-10-CM | POA: Diagnosis not present

## 2017-09-19 DIAGNOSIS — H35372 Puckering of macula, left eye: Secondary | ICD-10-CM | POA: Diagnosis not present

## 2017-09-21 ENCOUNTER — Other Ambulatory Visit: Payer: Self-pay | Admitting: Internal Medicine

## 2017-09-30 ENCOUNTER — Inpatient Hospital Stay (HOSPITAL_BASED_OUTPATIENT_CLINIC_OR_DEPARTMENT_OTHER): Payer: PPO | Admitting: Hematology & Oncology

## 2017-09-30 ENCOUNTER — Inpatient Hospital Stay: Payer: PPO

## 2017-09-30 ENCOUNTER — Encounter: Payer: Self-pay | Admitting: Hematology & Oncology

## 2017-09-30 ENCOUNTER — Other Ambulatory Visit: Payer: Self-pay

## 2017-09-30 ENCOUNTER — Inpatient Hospital Stay: Payer: PPO | Attending: Hematology & Oncology

## 2017-09-30 VITALS — BP 120/68 | HR 70 | Temp 98.0°F | Resp 18

## 2017-09-30 VITALS — BP 120/65 | HR 72 | Temp 98.6°F | Resp 19 | Wt 217.0 lb

## 2017-09-30 DIAGNOSIS — C9000 Multiple myeloma not having achieved remission: Secondary | ICD-10-CM

## 2017-09-30 DIAGNOSIS — Z79899 Other long term (current) drug therapy: Secondary | ICD-10-CM

## 2017-09-30 DIAGNOSIS — C9002 Multiple myeloma in relapse: Secondary | ICD-10-CM | POA: Insufficient documentation

## 2017-09-30 DIAGNOSIS — Z5111 Encounter for antineoplastic chemotherapy: Secondary | ICD-10-CM | POA: Diagnosis not present

## 2017-09-30 DIAGNOSIS — Z9481 Bone marrow transplant status: Secondary | ICD-10-CM | POA: Diagnosis not present

## 2017-09-30 LAB — CMP (CANCER CENTER ONLY)
ALT: 27 U/L (ref 10–47)
ANION GAP: 1 — AB (ref 5–15)
AST: 29 U/L (ref 11–38)
Albumin: 3.7 g/dL (ref 3.5–5.0)
Alkaline Phosphatase: 40 U/L (ref 26–84)
BILIRUBIN TOTAL: 0.6 mg/dL (ref 0.2–1.6)
BUN: 12 mg/dL (ref 7–22)
CALCIUM: 9.1 mg/dL (ref 8.0–10.3)
CO2: 27 mmol/L (ref 18–33)
Chloride: 111 mmol/L — ABNORMAL HIGH (ref 98–108)
Creatinine: 1 mg/dL (ref 0.60–1.20)
Glucose, Bld: 108 mg/dL (ref 73–118)
POTASSIUM: 3.9 mmol/L (ref 3.3–4.7)
Sodium: 139 mmol/L (ref 128–145)
TOTAL PROTEIN: 6.7 g/dL (ref 6.4–8.1)

## 2017-09-30 LAB — LACTATE DEHYDROGENASE: LDH: 172 U/L (ref 98–192)

## 2017-09-30 LAB — CBC WITH DIFFERENTIAL (CANCER CENTER ONLY)
BASOS ABS: 0 10*3/uL (ref 0.0–0.1)
Basophils Relative: 1 %
EOS PCT: 1 %
Eosinophils Absolute: 0.1 10*3/uL (ref 0.0–0.5)
HEMATOCRIT: 39.5 % (ref 38.7–49.9)
Hemoglobin: 12.8 g/dL — ABNORMAL LOW (ref 13.0–17.1)
LYMPHS ABS: 1.2 10*3/uL (ref 0.9–3.3)
LYMPHS PCT: 33 %
MCH: 33.2 pg (ref 28.0–33.4)
MCHC: 32.4 g/dL (ref 32.0–35.9)
MCV: 102.6 fL — AB (ref 82.0–98.0)
MONO ABS: 0.6 10*3/uL (ref 0.1–0.9)
Monocytes Relative: 16 %
NEUTROS ABS: 1.9 10*3/uL (ref 1.5–6.5)
NEUTROS PCT: 49 %
Platelet Count: 185 10*3/uL (ref 145–400)
RBC: 3.85 MIL/uL — AB (ref 4.20–5.70)
RDW: 14 % (ref 11.1–15.7)
WBC: 3.7 10*3/uL — AB (ref 4.0–10.0)

## 2017-09-30 MED ORDER — PROCHLORPERAZINE MALEATE 10 MG PO TABS
10.0000 mg | ORAL_TABLET | Freq: Once | ORAL | Status: AC
  Administered 2017-09-30: 10 mg via ORAL

## 2017-09-30 MED ORDER — DIPHENHYDRAMINE HCL 25 MG PO CAPS
ORAL_CAPSULE | ORAL | Status: AC
  Filled 2017-09-30: qty 2

## 2017-09-30 MED ORDER — PROCHLORPERAZINE MALEATE 10 MG PO TABS
ORAL_TABLET | ORAL | Status: AC
  Filled 2017-09-30: qty 1

## 2017-09-30 MED ORDER — SODIUM CHLORIDE 0.9 % IV SOLN
Freq: Once | INTRAVENOUS | Status: AC
  Administered 2017-09-30: 09:00:00 via INTRAVENOUS
  Filled 2017-09-30: qty 250

## 2017-09-30 MED ORDER — METHYLPREDNISOLONE SODIUM SUCC 125 MG IJ SOLR
125.0000 mg | Freq: Once | INTRAMUSCULAR | Status: AC
  Administered 2017-09-30: 125 mg via INTRAVENOUS

## 2017-09-30 MED ORDER — DIPHENHYDRAMINE HCL 25 MG PO CAPS
50.0000 mg | ORAL_CAPSULE | Freq: Once | ORAL | Status: AC
  Administered 2017-09-30: 50 mg via ORAL

## 2017-09-30 MED ORDER — METHYLPREDNISOLONE SODIUM SUCC 125 MG IJ SOLR
INTRAMUSCULAR | Status: AC
  Filled 2017-09-30: qty 2

## 2017-09-30 MED ORDER — HEPARIN SOD (PORK) LOCK FLUSH 100 UNIT/ML IV SOLN
500.0000 [IU] | Freq: Once | INTRAVENOUS | Status: AC | PRN
  Administered 2017-09-30: 500 [IU]
  Filled 2017-09-30: qty 5

## 2017-09-30 MED ORDER — ACETAMINOPHEN 325 MG PO TABS
ORAL_TABLET | ORAL | Status: AC
  Filled 2017-09-30: qty 2

## 2017-09-30 MED ORDER — SODIUM CHLORIDE 0.9 % IV SOLN
15.7000 mg/kg | Freq: Once | INTRAVENOUS | Status: AC
  Administered 2017-09-30: 1600 mg via INTRAVENOUS
  Filled 2017-09-30: qty 80

## 2017-09-30 MED ORDER — SODIUM CHLORIDE 0.9% FLUSH
10.0000 mL | INTRAVENOUS | Status: DC | PRN
  Administered 2017-09-30: 10 mL
  Filled 2017-09-30: qty 10

## 2017-09-30 MED ORDER — ACETAMINOPHEN 325 MG PO TABS
650.0000 mg | ORAL_TABLET | Freq: Once | ORAL | Status: AC
  Administered 2017-09-30: 650 mg via ORAL

## 2017-09-30 NOTE — Progress Notes (Signed)
Hematology and Oncology Follow Up Visit  GAREN WOOLBRIGHT [id number] [birth year] 77 y.o. 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 Rabalais is here today with his wife for follow-up and treatment.  Unfortunately, he is having a lot of back issues.  He is having some radiculopathy down the left leg.  This appears to be into the left thigh.  He also has some problems with the right hip.  I think this is all coming from his back.  He had an MRI that was done back in June which showed a lot of degenerative changes.  There is nothing that looks like myeloma.  He has had some injections.  These have helped.  They seem to have worn off.  His last myeloma studies have been stable.  [name] was 0.4 g/dL.  His IgA level was 561 mg/dL.  These are all stable, I said before.  He is been quite busy.  Is been doing a lot of work on the weekends.  He is trying to stay active.  He is lost 4 pounds.  He has had no cough or shortness of breath.  He has had no rashes.  He has had no leg swelling.  He has had no headache.  Overall, his performance status is ECOG 1.   Medications:  Allergies as of 09/30/2017   No Known Allergies     Medication List        Accurate as of 09/30/17  8:40 AM. Always use your most recent med list.          acetaminophen 325 MG tablet Commonly known as:  TYLENOL Take 650 mg by mouth every 6 (six) hours as needed. Alternates with his other pain killers   aspirin 325 MG tablet Take 325 mg by mouth daily.   DARZALEX IV Inject into the vein every 30 (thirty) days.   dexamethasone 2 MG tablet Commonly known as:  DECADRON Take 4mg  day after chemo then 2mg  daily x 4   Fish Oil 1200 MG Caps Take 1,200 mg by mouth daily.     ibuprofen 200 MG tablet Commonly known as:  ADVIL,MOTRIN Take 200 mg by mouth every 6 (six) hours as needed. Takes 3 tablets total 600 mg for pain as needed. Alternates with other pain killers.   lidocaine 5 % ointment Commonly known as:  XYLOCAINE Apply 1 application topically 2 (two) times daily as needed. Mix w/ OTC Hydrocortisone 1 %   methocarbamol 500 MG tablet Commonly known as:  ROBAXIN Take 1 tablet (500 mg total) by mouth 2 (two) times daily as needed for muscle spasms.   metoprolol tartrate 100 MG tablet Commonly known as:  LOPRESSOR Take 0.5 tablets (50 mg total) by mouth 2 (two) times daily.   multivitamin tablet Take 1 tablet by mouth daily.   ondansetron 4 MG tablet Commonly known as:  ZOFRAN TAKE 1 TABLET BY MOUTH 1 HOUR BEFORE NINLARO, THEN EVERY 8 HOURS AS NEEDED FOR NAUSEA   oxyCODONE 5 MG immediate release tablet Commonly known as:  Oxy IR/ROXICODONE Take 1 tablet (5 mg total) by mouth every 6 (six) hours as needed for severe pain.  pantoprazole 40 MG tablet Commonly known as:  PROTONIX Take 1 tablet (40 mg total) by mouth daily.   PROBIOTIC PO Take 1 tablet by mouth daily.   traMADol 50 MG tablet Commonly known as:  ULTRAM Take 0.5-1 tablets (25-50 mg total) by mouth 2 (two) times daily as needed.       Allergies: No Known Allergies  Past Medical History, Surgical history, Social history, and Family History were reviewed and updated.  Review of Systems: Review of Systems  Constitutional: Negative.   HENT: Negative.   Eyes: Negative.   Respiratory: Negative.   Cardiovascular: Negative.   Gastrointestinal: Negative.   Genitourinary: Negative.   Musculoskeletal: Positive for back pain and joint pain.  Skin: Negative.   Neurological: Negative.   Endo/Heme/Allergies: Negative.   Psychiatric/Behavioral: Negative.       Physical Exam:  weight is 217 lb (98.4 kg). His oral temperature is 98.6 F (37 C). His blood pressure is 120/65 and  his pulse is 72. His respiration is 19 and oxygen saturation is 94%.   Wt Readings from Last 3 Encounters:  09/30/17 217 lb (98.4 kg)  08/05/17 221 lb (100.2 kg)  07/08/17 221 lb (100.2 kg)    Physical Exam  Constitutional: He is oriented to person, place, and time.  HENT:  Head: Normocephalic and atraumatic.  Mouth/Throat: Oropharynx is clear and moist.  Eyes: Pupils are equal, round, and reactive to light. EOM are normal.  Neck: Normal range of motion.  Cardiovascular: Normal rate, regular rhythm and normal heart sounds.  Pulmonary/Chest: Effort normal and breath sounds normal.  Abdominal: Soft. Bowel sounds are normal.  Musculoskeletal: Normal range of motion. He exhibits no edema, tenderness or deformity.  Lymphadenopathy:    He has no cervical adenopathy.  Neurological: He is alert and oriented to person, place, and time.  Skin: Skin is warm and dry. No rash noted. No erythema.  Psychiatric: He has a normal mood and affect. His behavior is normal. Judgment and thought content normal.  Vitals reviewed.    Lab Results  Component Value Date   WBC 3.7 (L) 09/30/2017   HGB 12.8 (L) 09/30/2017   HCT 39.5 09/30/2017   MCV 102.6 (H) 09/30/2017   PLT 185 09/30/2017   Lab Results  Component Value Date   FERRITIN 351 (H) 06/26/2011   IRON 75 06/26/2011   TIBC 353 06/26/2011   UIBC 278 06/26/2011   IRONPCTSAT 21 06/26/2011   Lab Results  Component Value Date   RBC 3.85 (L) 09/30/2017   Lab Results  Component Value Date   KPAFRELGTCHN 17.2 09/02/2017   LAMBDASER 1.9 (L) 09/02/2017   KAPLAMBRATIO 9.05 (H) 09/02/2017   Lab Results  Component Value Date   IGGSERUM 230 (L) 09/02/2017   IGA 561 (H) 09/02/2017   IGMSERUM 6 (L) 09/02/2017   Lab Results  Component Value Date   TOTALPROTELP 6.5 09/02/2017   ALBUMINELP 3.6 09/02/2017   A1GS 0.2 09/02/2017   A2GS 1.0 09/02/2017   BETS 0.9 09/02/2017   BETA2SER 2.0 (H) 12/08/2014   GAMS 0.7 09/02/2017   MSPIKE 0.4 (H)  09/02/2017   SPEI Comment 09/02/2017     Chemistry      Component Value Date/Time   NA 141 09/02/2017 0825   NA 141 01/21/2017 0741   NA 139 06/14/2016 1246   K 3.8 09/02/2017 0825   K 4.0 01/21/2017 0741   K 3.8 06/14/2016 1246   CL 106 09/02/2017 0825   CL 103 01/21/2017 0741  CO2 28 09/02/2017 0825   CO2 30 01/21/2017 0741   CO2 24 06/14/2016 1246   BUN 16 09/02/2017 0825   BUN 20 01/21/2017 0741   BUN 15.0 06/14/2016 1246   CREATININE 1.00 09/02/2017 0825   CREATININE 1.0 01/21/2017 0741   CREATININE 0.9 06/14/2016 1246      Component Value Date/Time   CALCIUM 9.4 09/02/2017 0825   CALCIUM 9.0 01/21/2017 0741   CALCIUM 9.6 06/14/2016 1246   ALKPHOS 38 09/02/2017 0825   ALKPHOS 37 01/21/2017 0741   ALKPHOS 54 06/14/2016 1246   AST 23 09/02/2017 0825   AST 25 06/14/2016 1246   ALT 21 09/02/2017 0825   ALT 29 01/21/2017 0741   ALT 21 06/14/2016 1246   BILITOT 0.5 09/02/2017 0825   BILITOT 0.37 06/14/2016 1246      Impression and Plan: Mr. Nordahl is a very pleasant 77 yo caucasian gentleman with history of IgA kappa myeloma. He had high risk cytogenetics with a 17p- abnormality.   I think his active problem now is back.  Hopefully, he can go back to his orthopedic surgeon and have something done for this.  He had injections which seemed to work before.  I do not know if he actually needs any surgery.  From my perspective, he could have surgery if needed.  We will continue him on the daratumumab.  He is done quite well with this.  Again, his levels have been holding pretty stable.  They have been quite low and will never cause him problems if they are at this level.  We will continue to see him back.  We will have him come back in 1 month.       [name], MD 9/9/20198:40 AM

## 2017-10-01 LAB — KAPPA/LAMBDA LIGHT CHAINS
Kappa free light chain: 21.1 mg/L — ABNORMAL HIGH (ref 3.3–19.4)
Kappa, lambda light chain ratio: 10.05 — ABNORMAL HIGH (ref 0.26–1.65)
Lambda free light chains: 2.1 mg/L — ABNORMAL LOW (ref 5.7–26.3)

## 2017-10-02 LAB — PROTEIN ELECTROPHORESIS, SERUM, WITH REFLEX
A/G RATIO SPE: 1.3 (ref 0.7–1.7)
Albumin ELP: 3.6 g/dL (ref 2.9–4.4)
Alpha-1-Globulin: 0.2 g/dL (ref 0.0–0.4)
Alpha-2-Globulin: 1 g/dL (ref 0.4–1.0)
Beta Globulin: 0.8 g/dL (ref 0.7–1.3)
GAMMA GLOBULIN: 0.7 g/dL (ref 0.4–1.8)
GLOBULIN, TOTAL: 2.7 g/dL (ref 2.2–3.9)
M-SPIKE, %: 0.3 g/dL — AB
SPEP Interpretation: 0
TOTAL PROTEIN ELP: 6.3 g/dL (ref 6.0–8.5)

## 2017-10-02 LAB — IMMUNOFIXATION REFLEX, SERUM
IGA: 651 mg/dL — AB (ref 61–437)
IGG (IMMUNOGLOBIN G), SERUM: 245 mg/dL — AB (ref 700–1600)
IgM (Immunoglobulin M), Srm: 6 mg/dL — ABNORMAL LOW (ref 15–143)

## 2017-10-03 ENCOUNTER — Telehealth: Payer: Self-pay | Admitting: *Deleted

## 2017-10-03 DIAGNOSIS — M5136 Other intervertebral disc degeneration, lumbar region: Secondary | ICD-10-CM | POA: Diagnosis not present

## 2017-10-03 DIAGNOSIS — M545 Low back pain: Secondary | ICD-10-CM | POA: Diagnosis not present

## 2017-10-03 DIAGNOSIS — M47816 Spondylosis without myelopathy or radiculopathy, lumbar region: Secondary | ICD-10-CM | POA: Diagnosis not present

## 2017-10-03 NOTE — Telephone Encounter (Addendum)
Message left on home phone per dpr  ----- Message from [name], MD sent at 10/03/2017  6:00 AM EDT ----- Call - myeloma level is lower!!  Now it is 0.3. pete

## 2017-10-10 LAB — JAK2 (INCLUDING V617F AND EXON 12), MPL,& CALR-NEXT GEN SEQ

## 2017-10-28 ENCOUNTER — Inpatient Hospital Stay: Payer: PPO

## 2017-10-28 ENCOUNTER — Ambulatory Visit (HOSPITAL_BASED_OUTPATIENT_CLINIC_OR_DEPARTMENT_OTHER)
Admission: RE | Admit: 2017-10-28 | Discharge: 2017-10-28 | Disposition: A | Discharge status: Home / Self Care | Payer: PPO | Source: Ambulatory Visit | Attending: Hematology & Oncology | Admitting: Hematology & Oncology

## 2017-10-28 ENCOUNTER — Inpatient Hospital Stay: Payer: PPO | Attending: Hematology & Oncology | Admitting: Hematology & Oncology

## 2017-10-28 ENCOUNTER — Telehealth: Payer: Self-pay | Admitting: *Deleted

## 2017-10-28 VITALS — BP 128/72 | HR 72 | Temp 98.6°F | Resp 19 | Wt 214.1 lb

## 2017-10-28 DIAGNOSIS — M19031 Primary osteoarthritis, right wrist: Secondary | ICD-10-CM | POA: Insufficient documentation

## 2017-10-28 DIAGNOSIS — C9000 Multiple myeloma not having achieved remission: Secondary | ICD-10-CM | POA: Diagnosis not present

## 2017-10-28 DIAGNOSIS — Z23 Encounter for immunization: Secondary | ICD-10-CM | POA: Insufficient documentation

## 2017-10-28 DIAGNOSIS — M11231 Other chondrocalcinosis, right wrist: Secondary | ICD-10-CM | POA: Diagnosis not present

## 2017-10-28 DIAGNOSIS — Z79899 Other long term (current) drug therapy: Secondary | ICD-10-CM | POA: Diagnosis not present

## 2017-10-28 DIAGNOSIS — C9002 Multiple myeloma in relapse: Secondary | ICD-10-CM | POA: Diagnosis not present

## 2017-10-28 DIAGNOSIS — M19032 Primary osteoarthritis, left wrist: Secondary | ICD-10-CM | POA: Insufficient documentation

## 2017-10-28 DIAGNOSIS — Z9481 Bone marrow transplant status: Secondary | ICD-10-CM

## 2017-10-28 DIAGNOSIS — M546 Pain in thoracic spine: Secondary | ICD-10-CM

## 2017-10-28 DIAGNOSIS — Z5111 Encounter for antineoplastic chemotherapy: Secondary | ICD-10-CM | POA: Insufficient documentation

## 2017-10-28 LAB — CBC WITH DIFFERENTIAL (CANCER CENTER ONLY)
BASOS ABS: 0 10*3/uL (ref 0.0–0.1)
Basophils Relative: 0 %
EOS ABS: 0.1 10*3/uL (ref 0.0–0.5)
EOS PCT: 2 %
HCT: 39.2 % (ref 38.7–49.9)
Hemoglobin: 13.3 g/dL (ref 13.0–17.1)
LYMPHS ABS: 1.4 10*3/uL (ref 0.9–3.3)
Lymphocytes Relative: 42 %
MCH: 33.9 pg — AB (ref 28.0–33.4)
MCHC: 33.9 g/dL (ref 32.0–35.9)
MCV: 100 fL — ABNORMAL HIGH (ref 82.0–98.0)
Monocytes Absolute: 0.5 10*3/uL (ref 0.1–0.9)
Monocytes Relative: 14 %
NEUTROS PCT: 42 %
Neutro Abs: 1.5 10*3/uL (ref 1.5–6.5)
Platelet Count: 159 10*3/uL (ref 145–400)
RBC: 3.92 MIL/uL — AB (ref 4.20–5.70)
RDW: 13.7 % (ref 11.1–15.7)
WBC: 3.4 10*3/uL — AB (ref 4.0–10.0)

## 2017-10-28 LAB — CMP (CANCER CENTER ONLY)
ALBUMIN: 3.7 g/dL (ref 3.5–5.0)
ALT: 22 U/L (ref 10–47)
AST: 24 U/L (ref 11–38)
Alkaline Phosphatase: 39 U/L (ref 26–84)
Anion gap: 3 — ABNORMAL LOW (ref 5–15)
BUN: 13 mg/dL (ref 7–22)
CALCIUM: 9.4 mg/dL (ref 8.0–10.3)
CO2: 28 mmol/L (ref 18–33)
CREATININE: 1 mg/dL (ref 0.60–1.20)
Chloride: 109 mmol/L — ABNORMAL HIGH (ref 98–108)
GLUCOSE: 104 mg/dL (ref 73–118)
Potassium: 3.8 mmol/L (ref 3.3–4.7)
SODIUM: 140 mmol/L (ref 128–145)
TOTAL PROTEIN: 6.9 g/dL (ref 6.4–8.1)
Total Bilirubin: 0.6 mg/dL (ref 0.2–1.6)

## 2017-10-28 MED ORDER — PROCHLORPERAZINE MALEATE 10 MG PO TABS
ORAL_TABLET | ORAL | Status: AC
  Filled 2017-10-28: qty 1

## 2017-10-28 MED ORDER — METHYLPREDNISOLONE SODIUM SUCC 125 MG IJ SOLR
INTRAMUSCULAR | Status: AC
  Filled 2017-10-28: qty 2

## 2017-10-28 MED ORDER — INFLUENZA VAC SPLIT QUAD 0.5 ML IM SUSY
0.5000 mL | PREFILLED_SYRINGE | Freq: Once | INTRAMUSCULAR | Status: AC
  Administered 2017-10-28: 0.5 mL via INTRAMUSCULAR

## 2017-10-28 MED ORDER — ACETAMINOPHEN 325 MG PO TABS
650.0000 mg | ORAL_TABLET | Freq: Once | ORAL | Status: AC
  Administered 2017-10-28: 650 mg via ORAL

## 2017-10-28 MED ORDER — DIPHENHYDRAMINE HCL 25 MG PO CAPS
ORAL_CAPSULE | ORAL | Status: AC
  Filled 2017-10-28: qty 2

## 2017-10-28 MED ORDER — SODIUM CHLORIDE 0.9% FLUSH
10.0000 mL | INTRAVENOUS | Status: DC | PRN
  Administered 2017-10-28: 10 mL
  Filled 2017-10-28: qty 10

## 2017-10-28 MED ORDER — SODIUM CHLORIDE 0.9 % IV SOLN
15.7000 mg/kg | Freq: Once | INTRAVENOUS | Status: AC
  Administered 2017-10-28: 1600 mg via INTRAVENOUS
  Filled 2017-10-28: qty 80

## 2017-10-28 MED ORDER — METHYLPREDNISOLONE SODIUM SUCC 125 MG IJ SOLR
125.0000 mg | Freq: Once | INTRAMUSCULAR | Status: AC
  Administered 2017-10-28: 125 mg via INTRAVENOUS

## 2017-10-28 MED ORDER — INFLUENZA VAC SPLIT QUAD 0.5 ML IM SUSY
PREFILLED_SYRINGE | INTRAMUSCULAR | Status: AC
  Filled 2017-10-28: qty 0.5

## 2017-10-28 MED ORDER — DENOSUMAB 120 MG/1.7ML ~~LOC~~ SOLN
SUBCUTANEOUS | Status: AC
  Filled 2017-10-28: qty 1.7

## 2017-10-28 MED ORDER — DIPHENHYDRAMINE HCL 25 MG PO CAPS
50.0000 mg | ORAL_CAPSULE | Freq: Once | ORAL | Status: AC
  Administered 2017-10-28: 50 mg via ORAL

## 2017-10-28 MED ORDER — SODIUM CHLORIDE 0.9 % IV SOLN
Freq: Once | INTRAVENOUS | Status: AC
  Administered 2017-10-28: 10:00:00 via INTRAVENOUS
  Filled 2017-10-28: qty 250

## 2017-10-28 MED ORDER — PROCHLORPERAZINE MALEATE 10 MG PO TABS
10.0000 mg | ORAL_TABLET | Freq: Once | ORAL | Status: AC
  Administered 2017-10-28: 10 mg via ORAL

## 2017-10-28 MED ORDER — HEPARIN SOD (PORK) LOCK FLUSH 100 UNIT/ML IV SOLN
500.0000 [IU] | Freq: Once | INTRAVENOUS | Status: AC | PRN
  Administered 2017-10-28: 500 [IU]
  Filled 2017-10-28: qty 5

## 2017-10-28 MED ORDER — DENOSUMAB 120 MG/1.7ML ~~LOC~~ SOLN
120.0000 mg | Freq: Once | SUBCUTANEOUS | Status: AC
  Administered 2017-10-28: 120 mg via SUBCUTANEOUS

## 2017-10-28 MED ORDER — ACETAMINOPHEN 325 MG PO TABS
ORAL_TABLET | ORAL | Status: AC
  Filled 2017-10-28: qty 2

## 2017-10-28 NOTE — Telephone Encounter (Addendum)
Patient is aware of results  ----- Message from [name], MD sent at 10/28/2017  3:22 PM EDT ----- Call - No myeloma in the wrists.  A lot of arthritis.  When you see your orthopedist, he can help with this!!  Laurey Arrow

## 2017-10-28 NOTE — Progress Notes (Signed)
Hematology and Oncology Follow Up Visit  Gene Hill [id number] 09/26/1940 77 y.o. 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  He is doing pretty well.  He has had no problems with nausea or vomiting.  His back is still bothering him.  He does see orthopedic surgery for this.  He now has pain in his wrists bilaterally.  I told him that I did not think that this was myeloma.  He does do a lot of farming.  It might be wear and tear and arthritis.  We will go ahead and get plain x-rays.  He has had no issues with bowels or bladder.  He has had no diarrhea.  There is been no rashes.  He is had no leg swelling.  He has felt more tired.  His last M spike was 0.3 g/dL.  This is holding nice and steady.    Overall, his performance status is ECOG 1.   Medications:  Allergies as of 10/28/2017   No Known Allergies     Medication List        Accurate as of 10/28/17  8:42 AM. Always use your most recent med list.          acetaminophen 325 MG tablet Commonly known as:  TYLENOL Take 650 mg by mouth every 6 (six) hours as needed. Alternates with his other pain killers   aspirin 325 MG tablet Take 325 mg by mouth daily.   DARZALEX IV Inject into the vein every 30 (thirty) days.   dexamethasone 2 MG tablet Commonly known as:  DECADRON Take 4mg  day after chemo then 2mg  daily x 4   Fish Oil 1200 MG Caps Take 1,200 mg by mouth daily.   ibuprofen 200 MG tablet Commonly known as:  ADVIL,MOTRIN Take 200 mg by mouth every 6 (six) hours as needed. Takes 3 tablets total 600 mg for pain as needed. Alternates with other pain killers.   lidocaine 5 %  ointment Commonly known as:  XYLOCAINE Apply 1 application topically 2 (two) times daily as needed. Mix w/ OTC Hydrocortisone 1 %   methocarbamol 500 MG tablet Commonly known as:  ROBAXIN Take 1 tablet (500 mg total) by mouth 2 (two) times daily as needed for muscle spasms.   metoprolol tartrate 100 MG tablet Commonly known as:  LOPRESSOR Take 0.5 tablets (50 mg total) by mouth 2 (two) times daily.   multivitamin tablet Take 1 tablet by mouth daily.   ondansetron 4 MG tablet Commonly known as:  ZOFRAN TAKE 1 TABLET BY MOUTH 1 HOUR BEFORE NINLARO, THEN EVERY 8 HOURS AS NEEDED FOR NAUSEA   oxyCODONE 5 MG immediate release tablet Commonly known as:  Oxy IR/ROXICODONE Take 1 tablet (5 mg total) by mouth every 6 (six) hours as needed for severe pain.   pantoprazole 40 MG tablet Commonly known as:  PROTONIX Take 1 tablet (40 mg total) by mouth daily.   PROBIOTIC PO Take 1 tablet by mouth daily.   traMADol 50 MG tablet Commonly known as:  ULTRAM Take 0.5-1 tablets (25-50 mg total) by mouth  2 (two) times daily as needed.       Allergies: No Known Allergies  Past Medical History, Surgical history, Social history, and Family History were reviewed and updated.  Review of Systems: Review of Systems  Constitutional: Negative.   HENT: Negative.   Eyes: Negative.   Respiratory: Negative.   Cardiovascular: Negative.   Gastrointestinal: Negative.   Genitourinary: Negative.   Musculoskeletal: Positive for back pain and joint pain.  Skin: Negative.   Neurological: Negative.   Endo/Heme/Allergies: Negative.   Psychiatric/Behavioral: Negative.       Physical Exam:  weight is 214 lb 1.3 oz (97.1 kg). His oral temperature is 98.6 F (37 C). His blood pressure is 128/72 and his pulse is 72. His respiration is 19 and oxygen saturation is 96%.   Wt Readings from Last 3 Encounters:  10/28/17 214 lb 1.3 oz (97.1 kg)  09/30/17 217 lb (98.4 kg)  08/05/17 221 lb (100.2 kg)     Physical Exam  Constitutional: He is oriented to person, place, and time.  HENT:  Head: Normocephalic and atraumatic.  Mouth/Throat: Oropharynx is clear and moist.  Eyes: Pupils are equal, round, and reactive to light. EOM are normal.  Neck: Normal range of motion.  Cardiovascular: Normal rate, regular rhythm and normal heart sounds.  Pulmonary/Chest: Effort normal and breath sounds normal.  Abdominal: Soft. Bowel sounds are normal.  Musculoskeletal: Normal range of motion. He exhibits no edema, tenderness or deformity.  Lymphadenopathy:    He has no cervical adenopathy.  Neurological: He is alert and oriented to person, place, and time.  Skin: Skin is warm and dry. No rash noted. No erythema.  Psychiatric: He has a normal mood and affect. His behavior is normal. Judgment and thought content normal.  Vitals reviewed.    Lab Results  Component Value Date   WBC 3.4 (L) 10/28/2017   HGB 13.3 10/28/2017   HCT 39.2 10/28/2017   MCV 100.0 (H) 10/28/2017   PLT 159 10/28/2017   Lab Results  Component Value Date   FERRITIN 351 (H) 06/26/2011   IRON 75 06/26/2011   TIBC 353 06/26/2011   UIBC 278 06/26/2011   IRONPCTSAT 21 06/26/2011   Lab Results  Component Value Date   RBC 3.92 (L) 10/28/2017   Lab Results  Component Value Date   KPAFRELGTCHN 21.1 (H) 09/30/2017   LAMBDASER 2.1 (L) 09/30/2017   KAPLAMBRATIO 10.05 (H) 09/30/2017   Lab Results  Component Value Date   IGGSERUM 245 (L) 09/30/2017   IGA 651 (H) 09/30/2017   IGMSERUM 6 (L) 09/30/2017   Lab Results  Component Value Date   TOTALPROTELP 6.3 09/30/2017   ALBUMINELP 3.6 09/30/2017   A1GS 0.2 09/30/2017   A2GS 1.0 09/30/2017   BETS 0.8 09/30/2017   BETA2SER 2.0 (H) 12/08/2014   GAMS 0.7 09/30/2017   MSPIKE 0.3 (H) 09/30/2017   SPEI Comment 09/02/2017     Chemistry      Component Value Date/Time   NA 139 09/30/2017 0815   NA 141 01/21/2017 0741   NA 139 06/14/2016 1246   K 3.9 09/30/2017 0815    K 4.0 01/21/2017 0741   K 3.8 06/14/2016 1246   CL 111 (H) 09/30/2017 0815   CL 103 01/21/2017 0741   CO2 27 09/30/2017 0815   CO2 30 01/21/2017 0741   CO2 24 06/14/2016 1246   BUN 12 09/30/2017 0815   BUN 20 01/21/2017 0741   BUN 15.0 06/14/2016 1246   CREATININE 1.00 09/30/2017 0815  CREATININE 1.0 01/21/2017 0741   CREATININE 0.9 06/14/2016 1246      Component Value Date/Time   CALCIUM 9.1 09/30/2017 0815   CALCIUM 9.0 01/21/2017 0741   CALCIUM 9.6 06/14/2016 1246   ALKPHOS 40 09/30/2017 0815   ALKPHOS 37 01/21/2017 0741   ALKPHOS 54 06/14/2016 1246   AST 29 09/30/2017 0815   AST 25 06/14/2016 1246   ALT 27 09/30/2017 0815   ALT 29 01/21/2017 0741   ALT 21 06/14/2016 1246   BILITOT 0.6 09/30/2017 0815   BILITOT 0.37 06/14/2016 1246      Impression and Plan: Gene Hill is a very pleasant 77 yo caucasian gentleman with history of IgA kappa myeloma. He had high risk cytogenetics with a 17p- abnormality.   I forgot to mention that he had injections for his back last time we saw him.  He said this is helped a little bit.  I will go ahead and change his treatments every 6 weeks now.  We have to consider quality of life for him.  I think this is very important.  Hopefully, with every 6-week therapy, he will do okay and is myeloma will remain stable.  We will see what his x-rays show of the wrists.  I spent about 30 minutes with he and his wife today.  All the time spent face-to-face with him.  I went ahead and counsel them about my recommendations.  I answered all of their questions.  [name], MD 10/7/20198:42 AM

## 2017-10-28 NOTE — Patient Instructions (Signed)
Daratumumab injection What is this medicine? DARATUMUMAB (dar a toom ue mab) is a monoclonal antibody. It is used to treat multiple myeloma. This medicine may be used for other purposes; ask your health care provider or pharmacist if you have questions. COMMON BRAND NAME(S): DARZALEX What should I tell my health care provider before I take this medicine? They need to know if you have any of these conditions: -infection (especially a virus infection such as chickenpox, cold sores, or herpes) -lung or breathing disease -pregnant or trying to get pregnant -breast-feeding -an unusual or allergic reaction to daratumumab, other medicines, foods, dyes, or preservatives How should I use this medicine? This medicine is for infusion into a vein. It is given by a health care professional in a hospital or clinic setting. Talk to your pediatrician regarding the use of this medicine in children. Special care may be needed. Overdosage: If you think you have taken too much of this medicine contact a poison control center or emergency room at once. NOTE: This medicine is only for you. Do not share this medicine with others. What if I miss a dose? Keep appointments for follow-up doses as directed. It is important not to miss your dose. Call your doctor or health care professional if you are unable to keep an appointment. What may interact with this medicine? Interactions have not been studied. Give your health care provider a list of all the medicines, herbs, non-prescription drugs, or dietary supplements you use. Also tell them if you smoke, drink alcohol, or use illegal drugs. Some items may interact with your medicine. This list may not describe all possible interactions. Give your health care provider a list of all the medicines, herbs, non-prescription drugs, or dietary supplements you use. Also tell them if you smoke, drink alcohol, or use illegal drugs. Some items may interact with your medicine. What  should I watch for while using this medicine? This drug may make you feel generally unwell. Report any side effects. Continue your course of treatment even though you feel ill unless your doctor tells you to stop. This medicine can cause serious allergic reactions. To reduce your risk you may need to take medicine before treatment with this medicine. Take your medicine as directed. This medicine can affect the results of blood tests to match your blood type. These changes can last for up to 6 months after the final dose. Your healthcare provider will do blood tests to match your blood type before you start treatment. Tell all of your healthcare providers that you are being treated with this medicine before receiving a blood transfusion. This medicine can affect the results of some tests used to determine treatment response; extra tests may be needed to evaluate response. Do not become pregnant while taking this medicine or for 3 months after stopping it. Women should inform their doctor if they wish to become pregnant or think they might be pregnant. There is a potential for serious side effects to an unborn child. Talk to your health care professional or pharmacist for more information. What side effects may I notice from receiving this medicine? Side effects that you should report to your doctor or health care professional as soon as possible: -allergic reactions like skin rash, itching or hives, swelling of the face, lips, or tongue -breathing problems -chills -cough -dizziness -feeling faint or lightheaded -headache -low blood counts - this medicine may decrease the number of white blood cells, red blood cells and platelets. You may be at increased risk  for infections and bleeding. -nausea, vomiting -shortness of breath -signs of decreased platelets or bleeding - bruising, pinpoint red spots on the skin, black, tarry stools, blood in the urine -signs of decreased red blood cells - unusually  weak or tired, feeling faint or lightheaded, falls -signs of infection - fever or chills, cough, sore throat, pain or difficulty passing urine Side effects that usually do not require medical attention (report to your doctor or health care professional if they continue or are bothersome): -back pain -diarrhea -muscle cramps -pain, tingling, numbness in the hands or feet -swelling of the ankles, feet, hands -tiredness This list may not describe all possible side effects. Call your doctor for medical advice about side effects. You may report side effects to FDA at 1-800-FDA-1088. Where should I keep my medicine? Keep out of the reach of children. This drug is given in a hospital or clinic and will not be stored at home. NOTE: This sheet is a summary. It may not cover all possible information. If you have questions about this medicine, talk to your doctor, pharmacist, or health care provider.  2018 Elsevier/Gold Standard (2015-02-10 10:38:11)  

## 2017-10-28 NOTE — Patient Instructions (Signed)
Implanted Port Home Guide An implanted port is a type of central line that is placed under the skin. Central lines are used to provide IV access when treatment or nutrition needs to be given through a person's veins. Implanted ports are used for long-term IV access. An implanted port may be placed because:  You need IV medicine that would be irritating to the small veins in your hands or arms.  You need long-term IV medicines, such as antibiotics.  You need IV nutrition for a long period.  You need frequent blood draws for lab tests.  You need dialysis.  Implanted ports are usually placed in the chest area, but they can also be placed in the upper arm, the abdomen, or the leg. An implanted port has two main parts:  Reservoir. The reservoir is round and will appear as a small, raised area under your skin. The reservoir is the part where a needle is inserted to give medicines or draw blood.  Catheter. The catheter is a thin, flexible tube that extends from the reservoir. The catheter is placed into a large vein. Medicine that is inserted into the reservoir goes into the catheter and then into the vein.  How will I care for my incision site? Do not get the incision site wet. Bathe or shower as directed by your health care provider. How is my port accessed? Special steps must be taken to access the port:  Before the port is accessed, a numbing cream can be placed on the skin. This helps numb the skin over the port site.  Your health care provider uses a sterile technique to access the port. ? Your health care provider must put on a mask and sterile gloves. ? The skin over your port is cleaned carefully with an antiseptic and allowed to dry. ? The port is gently pinched between sterile gloves, and a needle is inserted into the port.  Only "non-coring" port needles should be used to access the port. Once the port is accessed, a blood return should be checked. This helps ensure that the port  is in the vein and is not clogged.  If your port needs to remain accessed for a constant infusion, a clear (transparent) bandage will be placed over the needle site. The bandage and needle will need to be changed every week, or as directed by your health care provider.  Keep the bandage covering the needle clean and dry. Do not get it wet. Follow your health care provider's instructions on how to take a shower or bath while the port is accessed.  If your port does not need to stay accessed, no bandage is needed over the port.  What is flushing? Flushing helps keep the port from getting clogged. Follow your health care provider's instructions on how and when to flush the port. Ports are usually flushed with saline solution or a medicine called heparin. The need for flushing will depend on how the port is used.  If the port is used for intermittent medicines or blood draws, the port will need to be flushed: ? After medicines have been given. ? After blood has been drawn. ? As part of routine maintenance.  If a constant infusion is running, the port may not need to be flushed.  How long will my port stay implanted? The port can stay in for as long as your health care provider thinks it is needed. When it is time for the port to come out, surgery will be   done to remove it. The procedure is similar to the one performed when the port was put in. When should I seek immediate medical care? When you have an implanted port, you should seek immediate medical care if:  You notice a bad smell coming from the incision site.  You have swelling, redness, or drainage at the incision site.  You have more swelling or pain at the port site or the surrounding area.  You have a fever that is not controlled with medicine.  This information is not intended to replace advice given to you by your health care provider. Make sure you discuss any questions you have with your health care provider. Document  Released: 01/08/2005 Document Revised: 06/16/2015 Document Reviewed: 09/15/2012 Elsevier Interactive Patient Education  2017 Elsevier Inc.  

## 2017-10-29 LAB — IGG, IGA, IGM
IGA: 623 mg/dL — AB (ref 61–437)
IGM (IMMUNOGLOBULIN M), SRM: 6 mg/dL — AB (ref 15–143)
IgG (Immunoglobin G), Serum: 252 mg/dL — ABNORMAL LOW (ref 700–1600)

## 2017-10-29 LAB — KAPPA/LAMBDA LIGHT CHAINS
KAPPA, LAMDA LIGHT CHAIN RATIO: 8.27 — AB (ref 0.26–1.65)
Kappa free light chain: 18.2 mg/L (ref 3.3–19.4)
Lambda free light chains: 2.2 mg/L — ABNORMAL LOW (ref 5.7–26.3)

## 2017-10-30 ENCOUNTER — Other Ambulatory Visit: Payer: Self-pay | Admitting: Hematology & Oncology

## 2017-10-30 DIAGNOSIS — J343 Hypertrophy of nasal turbinates: Secondary | ICD-10-CM | POA: Diagnosis not present

## 2017-10-30 DIAGNOSIS — H903 Sensorineural hearing loss, bilateral: Secondary | ICD-10-CM | POA: Diagnosis not present

## 2017-10-30 DIAGNOSIS — J342 Deviated nasal septum: Secondary | ICD-10-CM | POA: Diagnosis not present

## 2017-10-30 DIAGNOSIS — H6123 Impacted cerumen, bilateral: Secondary | ICD-10-CM | POA: Diagnosis not present

## 2017-11-01 LAB — PROTEIN ELECTROPHORESIS, SERUM, WITH REFLEX
A/G RATIO SPE: 1.3 (ref 0.7–1.7)
ALPHA-2-GLOBULIN: 1 g/dL (ref 0.4–1.0)
Albumin ELP: 3.7 g/dL (ref 2.9–4.4)
Alpha-1-Globulin: 0.2 g/dL (ref 0.0–0.4)
Beta Globulin: 0.9 g/dL (ref 0.7–1.3)
GLOBULIN, TOTAL: 2.8 g/dL (ref 2.2–3.9)
Gamma Globulin: 0.7 g/dL (ref 0.4–1.8)
M-SPIKE, %: 0.4 g/dL — AB
SPEP Interpretation: 0
Total Protein ELP: 6.5 g/dL (ref 6.0–8.5)

## 2017-11-01 LAB — IMMUNOFIXATION REFLEX, SERUM
IGA: 657 mg/dL — AB (ref 61–437)
IGG (IMMUNOGLOBIN G), SERUM: 252 mg/dL — AB (ref 700–1600)
IGM (IMMUNOGLOBULIN M), SRM: 5 mg/dL — AB (ref 15–143)

## 2017-11-04 ENCOUNTER — Telehealth: Payer: Self-pay | Admitting: *Deleted

## 2017-11-04 NOTE — Telephone Encounter (Addendum)
Patient's wife aware of results  ----- Message from [name], MD sent at 11/01/2017  5:06 PM EDT ----- Call - myeloma is stable!!  pete

## 2017-11-05 ENCOUNTER — Other Ambulatory Visit: Payer: Self-pay | Admitting: Internal Medicine

## 2017-11-25 ENCOUNTER — Ambulatory Visit: Payer: PPO

## 2017-11-25 ENCOUNTER — Ambulatory Visit: Payer: PPO | Admitting: Hematology & Oncology

## 2017-11-25 ENCOUNTER — Other Ambulatory Visit: Payer: PPO

## 2017-12-03 DIAGNOSIS — M47816 Spondylosis without myelopathy or radiculopathy, lumbar region: Secondary | ICD-10-CM | POA: Diagnosis not present

## 2017-12-03 DIAGNOSIS — M5416 Radiculopathy, lumbar region: Secondary | ICD-10-CM | POA: Diagnosis not present

## 2017-12-03 DIAGNOSIS — M5136 Other intervertebral disc degeneration, lumbar region: Secondary | ICD-10-CM | POA: Diagnosis not present

## 2017-12-03 DIAGNOSIS — M545 Low back pain: Secondary | ICD-10-CM | POA: Diagnosis not present

## 2017-12-09 ENCOUNTER — Inpatient Hospital Stay: Payer: PPO | Attending: Hematology & Oncology | Admitting: Hematology & Oncology

## 2017-12-09 ENCOUNTER — Encounter: Payer: Self-pay | Admitting: Hematology & Oncology

## 2017-12-09 ENCOUNTER — Other Ambulatory Visit: Payer: Self-pay

## 2017-12-09 ENCOUNTER — Inpatient Hospital Stay: Payer: PPO

## 2017-12-09 VITALS — BP 118/64 | HR 84 | Temp 98.2°F | Resp 18

## 2017-12-09 VITALS — BP 137/78 | HR 82 | Temp 98.1°F | Resp 18 | Wt 210.0 lb

## 2017-12-09 DIAGNOSIS — Z79899 Other long term (current) drug therapy: Secondary | ICD-10-CM | POA: Insufficient documentation

## 2017-12-09 DIAGNOSIS — C9002 Multiple myeloma in relapse: Secondary | ICD-10-CM | POA: Insufficient documentation

## 2017-12-09 DIAGNOSIS — C9000 Multiple myeloma not having achieved remission: Secondary | ICD-10-CM

## 2017-12-09 DIAGNOSIS — Z5111 Encounter for antineoplastic chemotherapy: Secondary | ICD-10-CM | POA: Insufficient documentation

## 2017-12-09 LAB — CBC WITH DIFFERENTIAL (CANCER CENTER ONLY)
ABS IMMATURE GRANULOCYTES: 0.07 10*3/uL (ref 0.00–0.07)
Basophils Absolute: 0 10*3/uL (ref 0.0–0.1)
Basophils Relative: 0 %
Eosinophils Absolute: 0.1 10*3/uL (ref 0.0–0.5)
Eosinophils Relative: 1 %
HEMATOCRIT: 38.7 % — AB (ref 39.0–52.0)
Hemoglobin: 12.7 g/dL — ABNORMAL LOW (ref 13.0–17.0)
IMMATURE GRANULOCYTES: 1 %
LYMPHS ABS: 1.6 10*3/uL (ref 0.7–4.0)
LYMPHS PCT: 16 %
MCH: 33.4 pg (ref 26.0–34.0)
MCHC: 32.8 g/dL (ref 30.0–36.0)
MCV: 101.8 fL — AB (ref 80.0–100.0)
MONOS PCT: 10 %
Monocytes Absolute: 1 10*3/uL (ref 0.1–1.0)
NEUTROS ABS: 7.3 10*3/uL (ref 1.7–7.7)
NEUTROS PCT: 72 %
Platelet Count: 209 10*3/uL (ref 150–400)
RBC: 3.8 MIL/uL — ABNORMAL LOW (ref 4.22–5.81)
RDW: 13.8 % (ref 11.5–15.5)
WBC Count: 10.1 10*3/uL (ref 4.0–10.5)
nRBC: 0 % (ref 0.0–0.2)

## 2017-12-09 LAB — CMP (CANCER CENTER ONLY)
ALT: 24 U/L (ref 10–47)
ANION GAP: 12 (ref 5–15)
AST: 23 U/L (ref 11–38)
Albumin: 3.5 g/dL (ref 3.5–5.0)
Alkaline Phosphatase: 53 U/L (ref 26–84)
BILIRUBIN TOTAL: 0.6 mg/dL (ref 0.2–1.6)
BUN: 16 mg/dL (ref 7–22)
CALCIUM: 9.3 mg/dL (ref 8.0–10.3)
CO2: 27 mmol/L (ref 18–33)
Chloride: 106 mmol/L (ref 98–108)
Creatinine: 0.9 mg/dL (ref 0.60–1.20)
Glucose, Bld: 105 mg/dL (ref 73–118)
Potassium: 3.9 mmol/L (ref 3.3–4.7)
Sodium: 145 mmol/L (ref 128–145)
TOTAL PROTEIN: 7.4 g/dL (ref 6.4–8.1)

## 2017-12-09 MED ORDER — DIPHENHYDRAMINE HCL 25 MG PO CAPS
50.0000 mg | ORAL_CAPSULE | Freq: Once | ORAL | Status: AC
  Administered 2017-12-09: 50 mg via ORAL

## 2017-12-09 MED ORDER — DIPHENHYDRAMINE HCL 25 MG PO CAPS
ORAL_CAPSULE | ORAL | Status: AC
  Filled 2017-12-09: qty 2

## 2017-12-09 MED ORDER — HEPARIN SOD (PORK) LOCK FLUSH 100 UNIT/ML IV SOLN
500.0000 [IU] | Freq: Once | INTRAVENOUS | Status: AC | PRN
  Administered 2017-12-09: 500 [IU]
  Filled 2017-12-09: qty 5

## 2017-12-09 MED ORDER — PROCHLORPERAZINE MALEATE 10 MG PO TABS
ORAL_TABLET | ORAL | Status: AC
  Filled 2017-12-09: qty 1

## 2017-12-09 MED ORDER — PROCHLORPERAZINE MALEATE 10 MG PO TABS
10.0000 mg | ORAL_TABLET | Freq: Once | ORAL | Status: AC
  Administered 2017-12-09: 10 mg via ORAL

## 2017-12-09 MED ORDER — METHYLPREDNISOLONE SODIUM SUCC 125 MG IJ SOLR
125.0000 mg | Freq: Once | INTRAMUSCULAR | Status: AC
  Administered 2017-12-09: 125 mg via INTRAVENOUS

## 2017-12-09 MED ORDER — SODIUM CHLORIDE 0.9 % IV SOLN
15.7000 mg/kg | Freq: Once | INTRAVENOUS | Status: AC
  Administered 2017-12-09: 1600 mg via INTRAVENOUS
  Filled 2017-12-09: qty 80

## 2017-12-09 MED ORDER — SODIUM CHLORIDE 0.9% FLUSH
10.0000 mL | INTRAVENOUS | Status: DC | PRN
  Administered 2017-12-09: 10 mL
  Filled 2017-12-09: qty 10

## 2017-12-09 MED ORDER — METHYLPREDNISOLONE SODIUM SUCC 125 MG IJ SOLR
INTRAMUSCULAR | Status: AC
  Filled 2017-12-09: qty 2

## 2017-12-09 MED ORDER — ACETAMINOPHEN 325 MG PO TABS
ORAL_TABLET | ORAL | Status: AC
  Filled 2017-12-09: qty 2

## 2017-12-09 MED ORDER — ACETAMINOPHEN 325 MG PO TABS
650.0000 mg | ORAL_TABLET | Freq: Once | ORAL | Status: AC
  Administered 2017-12-09: 650 mg via ORAL

## 2017-12-09 MED ORDER — SODIUM CHLORIDE 0.9 % IV SOLN
Freq: Once | INTRAVENOUS | Status: AC
  Administered 2017-12-09: 10:00:00 via INTRAVENOUS
  Filled 2017-12-09: qty 250

## 2017-12-09 NOTE — Progress Notes (Signed)
Hematology and Oncology Follow Up Visit  Gene Hill [id number] Oct 04, 1940 77 y.o. 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  Overall, he actually is doing fairly well.  He is looking forward to Thanksgiving and Christmas.  He really is does not have any specific complaints.  He does have a lot of lower back issues.  This is not reflective of myeloma at all.  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  He has been getting injections.  I think he has had some epidural injections but this is not been lately.  As far as his myeloma is concerned, everything is been holding pretty stable.  More he saw him back in October, [name] was 0.4 g/dL.  His IgG a level was 657 mg/dL.  His Kappa light chain level was 1.8 mg/dL.   As always, he brings [place] to have him from his garden.  He brought in some terms grains.  I am sure that we will prepare these for Thanksgiving.  I think he goes back to the orthopedist sometime this week.  His wife has a fracture of 1/5 metatarsal.  Hopefully she will come off her walking boot soon.  Overall, his performance status is ECOG 1   Medications:  Allergies as of 12/09/2017   No Known Allergies     Medication List        Accurate as of 12/09/17  9:06 AM. Always use your most recent med list.          acetaminophen 325 MG tablet Commonly known as:  TYLENOL Take 650 mg by mouth every 6 (six) hours as needed. Alternates with his other pain killers   aspirin 325 MG tablet Take 325 mg by mouth daily.   DARZALEX IV Inject into the vein every 30 (thirty) days.   dexamethasone 2 MG tablet Commonly known as:   DECADRON TAKE 2 TABLETS BY MOUTH DAY AFTER CHEMO THEN 1 TABLET EVERY DAY FOR 4 DAYS   Fish Oil 1200 MG Caps Take 1,200 mg by mouth daily.   ibuprofen 200 MG tablet Commonly known as:  ADVIL,MOTRIN Take 200 mg by mouth every 6 (six) hours as needed. Takes 3 tablets total 600 mg for pain as needed. Alternates with other pain killers.   lidocaine 5 % ointment Commonly known as:  XYLOCAINE Apply 1 application topically 2 (two) times daily as needed. Mix w/ OTC Hydrocortisone 1 %   methocarbamol 500 MG tablet Commonly known as:  ROBAXIN Take 1 tablet (500 mg total) by mouth 2 (two) times daily as needed for muscle spasms.   metoprolol tartrate 100 MG tablet Commonly known as:  LOPRESSOR Take 0.5 tablets (50 mg total) by mouth 2 (two) times daily.   multivitamin tablet Take 1 tablet by mouth daily.   ondansetron 4 MG tablet Commonly known as:  ZOFRAN TAKE 1 TABLET BY MOUTH 1 HOUR BEFORE NINLARO, THEN EVERY 8 HOURS AS NEEDED FOR NAUSEA   oxyCODONE 5 MG immediate release tablet Commonly known as:  Oxy IR/ROXICODONE Take 1 tablet (5 mg total) by mouth every  6 (six) hours as needed for severe pain.   pantoprazole 40 MG tablet Commonly known as:  PROTONIX Take 1 tablet (40 mg total) by mouth daily.   PROBIOTIC PO Take 1 tablet by mouth daily.   traMADol 50 MG tablet Commonly known as:  ULTRAM Take 0.5-1 tablets (25-50 mg total) by mouth 2 (two) times daily as needed.       Allergies: No Known Allergies  Past Medical History, Surgical history, Social history, and Family History were reviewed and updated.  Review of Systems: Review of Systems  Constitutional: Negative.   HENT: Negative.   Eyes: Negative.   Respiratory: Negative.   Cardiovascular: Negative.   Gastrointestinal: Negative.   Genitourinary: Negative.   Musculoskeletal: Positive for back pain and joint pain.  Skin: Negative.   Neurological: Negative.   Endo/Heme/Allergies: Negative.    Psychiatric/Behavioral: Negative.       Physical Exam:  weight is 210 lb (95.3 kg). His oral temperature is 98.1 F (36.7 C). His blood pressure is 137/78 and his pulse is 82. His respiration is 18.   Wt Readings from Last 3 Encounters:  12/09/17 210 lb (95.3 kg)  10/28/17 214 lb 1.3 oz (97.1 kg)  09/30/17 217 lb (98.4 kg)    Physical Exam  Constitutional: He is oriented to person, place, and time.  HENT:  Head: Normocephalic and atraumatic.  Mouth/Throat: Oropharynx is clear and moist.  Eyes: Pupils are equal, round, and reactive to light. EOM are normal.  Neck: Normal range of motion.  Cardiovascular: Normal rate, regular rhythm and normal heart sounds.  Pulmonary/Chest: Effort normal and breath sounds normal.  Abdominal: Soft. Bowel sounds are normal.  Musculoskeletal: Normal range of motion. He exhibits no edema, tenderness or deformity.  Lymphadenopathy:    He has no cervical adenopathy.  Neurological: He is alert and oriented to person, place, and time.  Skin: Skin is warm and dry. No rash noted. No erythema.  Psychiatric: He has a normal mood and affect. His behavior is normal. Judgment and thought content normal.  Vitals reviewed.    Lab Results  Component Value Date   WBC 10.1 12/09/2017   HGB 12.7 (L) 12/09/2017   HCT 38.7 (L) 12/09/2017   MCV 101.8 (H) 12/09/2017   PLT 209 12/09/2017   Lab Results  Component Value Date   FERRITIN 351 (H) 06/26/2011   IRON 75 06/26/2011   TIBC 353 06/26/2011   UIBC 278 06/26/2011   IRONPCTSAT 21 06/26/2011   Lab Results  Component Value Date   RBC 3.80 (L) 12/09/2017   Lab Results  Component Value Date   KPAFRELGTCHN 18.2 10/28/2017   LAMBDASER 2.2 (L) 10/28/2017   KAPLAMBRATIO 8.27 (H) 10/28/2017   Lab Results  Component Value Date   IGGSERUM 252 (L) 10/28/2017   IGA 657 (H) 10/28/2017   IGMSERUM 5 (L) 10/28/2017   Lab Results  Component Value Date   TOTALPROTELP 6.5 10/28/2017   ALBUMINELP 3.7  10/28/2017   A1GS 0.2 10/28/2017   A2GS 1.0 10/28/2017   BETS 0.9 10/28/2017   BETA2SER 2.0 (H) 12/08/2014   GAMS 0.7 10/28/2017   MSPIKE 0.4 (H) 10/28/2017   SPEI Comment 09/02/2017     Chemistry      Component Value Date/Time   NA 140 10/28/2017 0821   NA 141 01/21/2017 0741   NA 139 06/14/2016 1246   K 3.8 10/28/2017 0821   K 4.0 01/21/2017 0741   K 3.8 06/14/2016 1246   CL 109 (H)  10/28/2017 0821   CL 103 01/21/2017 0741   CO2 28 10/28/2017 0821   CO2 30 01/21/2017 0741   CO2 24 06/14/2016 1246   BUN 13 10/28/2017 0821   BUN 20 01/21/2017 0741   BUN 15.0 06/14/2016 1246   CREATININE 1.00 10/28/2017 0821   CREATININE 1.0 01/21/2017 0741   CREATININE 0.9 06/14/2016 1246      Component Value Date/Time   CALCIUM 9.4 10/28/2017 0821   CALCIUM 9.0 01/21/2017 0741   CALCIUM 9.6 06/14/2016 1246   ALKPHOS 39 10/28/2017 0821   ALKPHOS 37 01/21/2017 0741   ALKPHOS 54 06/14/2016 1246   AST 24 10/28/2017 0821   AST 25 06/14/2016 1246   ALT 22 10/28/2017 0821   ALT 29 01/21/2017 0741   ALT 21 06/14/2016 1246   BILITOT 0.6 10/28/2017 0821   BILITOT 0.37 06/14/2016 1246      Impression and Plan: Gene Hill is a very pleasant 77 yo caucasian gentleman with history of IgA kappa myeloma. He had high risk cytogenetics with a 17p- abnormality.   Overall, I really think that he is done well.  His myeloma studies have looked stable.  We will see what his myeloma levels look like today.  If there is no change in everything looks stable or improved, then we will plan to get him back after New Year's.  I must say that he is done remarkably well given the fact that he has the 17p-abnormality.  We clearly are looking a quality of life issues for Gene Hill.  He wants to be able to work in his garden as much as possible.   [name], MD 11/18/20199:06 AM

## 2017-12-09 NOTE — Patient Instructions (Signed)
[facility] Discharge Instructions for Patients Receiving Chemotherapy  Today you received the following chemotherapy agents:  Darzalex  To help prevent nausea and vomiting after your treatment, we encourage you to take your nausea medication as prescribed.   If you develop nausea and vomiting that is not controlled by your nausea medication, call the clinic.   BELOW ARE SYMPTOMS THAT SHOULD BE REPORTED IMMEDIATELY:  *FEVER GREATER THAN 100.5 F  *CHILLS WITH OR WITHOUT FEVER  NAUSEA AND VOMITING THAT IS NOT CONTROLLED WITH YOUR NAUSEA MEDICATION  *UNUSUAL SHORTNESS OF BREATH  *UNUSUAL BRUISING OR BLEEDING  TENDERNESS IN MOUTH AND THROAT WITH OR WITHOUT PRESENCE OF ULCERS  *URINARY PROBLEMS  *BOWEL PROBLEMS  UNUSUAL RASH Items with * indicate a potential emergency and should be followed up as soon as possible.  Feel free to call the clinic should you have any questions or concerns. The clinic phone number is [phone].  Please show the CHEMO ALERT CARD at check-in to the Emergency Department and triage nurse.   

## 2017-12-10 LAB — KAPPA/LAMBDA LIGHT CHAINS
Kappa free light chain: 24.2 mg/L — ABNORMAL HIGH (ref 3.3–19.4)
Kappa, lambda light chain ratio: 9.68 — ABNORMAL HIGH (ref 0.26–1.65)
Lambda free light chains: 2.5 mg/L — ABNORMAL LOW (ref 5.7–26.3)

## 2017-12-10 LAB — IGG, IGA, IGM
IGA: 724 mg/dL — AB (ref 61–437)
IgG (Immunoglobin G), Serum: 254 mg/dL — ABNORMAL LOW (ref 700–1600)
IgM (Immunoglobulin M), Srm: 6 mg/dL — ABNORMAL LOW (ref 15–143)

## 2017-12-11 DIAGNOSIS — H938X1 Other specified disorders of right ear: Secondary | ICD-10-CM | POA: Diagnosis not present

## 2017-12-11 DIAGNOSIS — J342 Deviated nasal septum: Secondary | ICD-10-CM | POA: Diagnosis not present

## 2017-12-11 DIAGNOSIS — H903 Sensorineural hearing loss, bilateral: Secondary | ICD-10-CM | POA: Diagnosis not present

## 2017-12-12 ENCOUNTER — Telehealth: Payer: Self-pay | Admitting: *Deleted

## 2017-12-12 LAB — PROTEIN ELECTROPHORESIS, SERUM, WITH REFLEX
A/G Ratio: 1.2 (ref 0.7–1.7)
ALPHA-1-GLOBULIN: 0.2 g/dL (ref 0.0–0.4)
ALPHA-2-GLOBULIN: 1.2 g/dL — AB (ref 0.4–1.0)
Albumin ELP: 3.6 g/dL (ref 2.9–4.4)
Beta Globulin: 1 g/dL (ref 0.7–1.3)
Gamma Globulin: 0.7 g/dL (ref 0.4–1.8)
Globulin, Total: 3.1 g/dL (ref 2.2–3.9)
M-Spike, %: 0.4 g/dL — ABNORMAL HIGH
SPEP INTERP: 0
TOTAL PROTEIN ELP: 6.7 g/dL (ref 6.0–8.5)

## 2017-12-12 LAB — IMMUNOFIXATION REFLEX, SERUM
IGM (IMMUNOGLOBULIN M), SRM: 7 mg/dL — AB (ref 15–143)
IgA: 742 mg/dL — ABNORMAL HIGH (ref 61–437)
IgG (Immunoglobin G), Serum: 271 mg/dL — ABNORMAL LOW (ref 700–1600)

## 2017-12-12 NOTE — Telephone Encounter (Signed)
-----   Message from [name], MD sent at 12/12/2017  1:50 PM EST ----- Call =  The myeloma level is low and stable at 0.4!!  Gene Hill

## 2017-12-12 NOTE — Telephone Encounter (Signed)
As noted below by Dr. Marin Olp, I informed the wife that the myeloma level is low and stable at 0.4. She verbalized understanding.

## 2017-12-23 ENCOUNTER — Other Ambulatory Visit: Payer: PPO

## 2017-12-23 ENCOUNTER — Ambulatory Visit: Payer: PPO | Admitting: Hematology & Oncology

## 2017-12-23 ENCOUNTER — Other Ambulatory Visit: Payer: Self-pay | Admitting: *Deleted

## 2017-12-23 ENCOUNTER — Ambulatory Visit: Payer: PPO

## 2017-12-23 DIAGNOSIS — C9000 Multiple myeloma not having achieved remission: Secondary | ICD-10-CM

## 2017-12-23 MED ORDER — OXYCODONE HCL 5 MG PO TABS: 5.0000 mg | ORAL_TABLET | Freq: Four times a day (QID) | ORAL | 0 refills | Status: DC | PRN

## 2017-12-26 ENCOUNTER — Encounter: Payer: Self-pay | Admitting: *Deleted

## 2018-01-17 DIAGNOSIS — M5136 Other intervertebral disc degeneration, lumbar region: Secondary | ICD-10-CM | POA: Diagnosis not present

## 2018-01-17 DIAGNOSIS — M47816 Spondylosis without myelopathy or radiculopathy, lumbar region: Secondary | ICD-10-CM | POA: Diagnosis not present

## 2018-01-17 DIAGNOSIS — M545 Low back pain: Secondary | ICD-10-CM | POA: Diagnosis not present

## 2018-01-20 ENCOUNTER — Inpatient Hospital Stay: Payer: PPO

## 2018-01-20 ENCOUNTER — Other Ambulatory Visit: Payer: Self-pay

## 2018-01-20 ENCOUNTER — Encounter: Payer: Self-pay | Admitting: Hematology & Oncology

## 2018-01-20 ENCOUNTER — Inpatient Hospital Stay: Payer: PPO | Attending: Hematology & Oncology | Admitting: Hematology & Oncology

## 2018-01-20 VITALS — BP 119/69 | HR 79 | Resp 16

## 2018-01-20 VITALS — BP 129/75 | HR 72 | Temp 98.4°F | Resp 18 | Wt 217.0 lb

## 2018-01-20 DIAGNOSIS — C9000 Multiple myeloma not having achieved remission: Secondary | ICD-10-CM

## 2018-01-20 DIAGNOSIS — Z5111 Encounter for antineoplastic chemotherapy: Secondary | ICD-10-CM | POA: Insufficient documentation

## 2018-01-20 DIAGNOSIS — Z9481 Bone marrow transplant status: Secondary | ICD-10-CM

## 2018-01-20 DIAGNOSIS — C9002 Multiple myeloma in relapse: Secondary | ICD-10-CM | POA: Insufficient documentation

## 2018-01-20 DIAGNOSIS — Z79899 Other long term (current) drug therapy: Secondary | ICD-10-CM | POA: Insufficient documentation

## 2018-01-20 DIAGNOSIS — M546 Pain in thoracic spine: Secondary | ICD-10-CM

## 2018-01-20 LAB — CMP (CANCER CENTER ONLY)
ALT: 18 U/L (ref 0–44)
ANION GAP: 8 (ref 5–15)
AST: 13 U/L — ABNORMAL LOW (ref 15–41)
Albumin: 3.9 g/dL (ref 3.5–5.0)
Alkaline Phosphatase: 34 U/L — ABNORMAL LOW (ref 38–126)
BUN: 18 mg/dL (ref 8–23)
CHLORIDE: 105 mmol/L (ref 98–111)
CO2: 27 mmol/L (ref 22–32)
Calcium: 8.2 mg/dL — ABNORMAL LOW (ref 8.9–10.3)
Creatinine: 0.83 mg/dL (ref 0.61–1.24)
GFR, Est AFR Am: >60 mL/min (ref >60)
GFR, Estimated: >60 mL/min (ref >60)
Glucose, Bld: 101 mg/dL — ABNORMAL HIGH (ref 70–99)
Potassium: 3.7 mmol/L (ref 3.5–5.1)
Sodium: 140 mmol/L (ref 135–145)
Total Bilirubin: 0.3 mg/dL (ref 0.3–1.2)
Total Protein: 6.5 g/dL (ref 6.5–8.1)

## 2018-01-20 LAB — CBC WITH DIFFERENTIAL (CANCER CENTER ONLY)
Abs Immature Granulocytes: 0.11 10*3/uL — ABNORMAL HIGH (ref 0.00–0.07)
BASOS PCT: 0 %
Basophils Absolute: 0 10*3/uL (ref 0.0–0.1)
EOS PCT: 1 %
Eosinophils Absolute: 0.1 10*3/uL (ref 0.0–0.5)
HCT: 38.3 % — ABNORMAL LOW (ref 39.0–52.0)
Hemoglobin: 12 g/dL — ABNORMAL LOW (ref 13.0–17.0)
Immature Granulocytes: 2 %
Lymphocytes Relative: 24 %
Lymphs Abs: 1.4 10*3/uL (ref 0.7–4.0)
MCH: 32.8 pg (ref 26.0–34.0)
MCHC: 31.3 g/dL (ref 30.0–36.0)
MCV: 104.6 fL — ABNORMAL HIGH (ref 80.0–100.0)
MONO ABS: 0.9 10*3/uL (ref 0.1–1.0)
Monocytes Relative: 15 %
Neutro Abs: 3.4 10*3/uL (ref 1.7–7.7)
Neutrophils Relative %: 58 %
PLATELETS: 178 10*3/uL (ref 150–400)
RBC: 3.66 MIL/uL — ABNORMAL LOW (ref 4.22–5.81)
RDW: 14.6 % (ref 11.5–15.5)
WBC: 5.9 10*3/uL (ref 4.0–10.5)
nRBC: 0 % (ref 0.0–0.2)

## 2018-01-20 LAB — LACTATE DEHYDROGENASE: LDH: 136 U/L (ref 98–192)

## 2018-01-20 MED ORDER — PROCHLORPERAZINE MALEATE 10 MG PO TABS
ORAL_TABLET | ORAL | Status: AC
  Filled 2018-01-20: qty 1

## 2018-01-20 MED ORDER — SODIUM CHLORIDE 0.9 % IV SOLN
Freq: Once | INTRAVENOUS | Status: AC
  Administered 2018-01-20: 09:00:00 via INTRAVENOUS
  Filled 2018-01-20: qty 250

## 2018-01-20 MED ORDER — SODIUM CHLORIDE 0.9 % IV SOLN
15.7000 mg/kg | Freq: Once | INTRAVENOUS | Status: AC
  Administered 2018-01-20: 1600 mg via INTRAVENOUS
  Filled 2018-01-20: qty 80

## 2018-01-20 MED ORDER — PROCHLORPERAZINE MALEATE 10 MG PO TABS
10.0000 mg | ORAL_TABLET | Freq: Once | ORAL | Status: AC
  Administered 2018-01-20: 10 mg via ORAL

## 2018-01-20 MED ORDER — METHYLPREDNISOLONE SODIUM SUCC 125 MG IJ SOLR
INTRAMUSCULAR | Status: AC
  Filled 2018-01-20: qty 2

## 2018-01-20 MED ORDER — DIPHENHYDRAMINE HCL 25 MG PO CAPS
ORAL_CAPSULE | ORAL | Status: AC
  Filled 2018-01-20: qty 2

## 2018-01-20 MED ORDER — METHYLPREDNISOLONE SODIUM SUCC 125 MG IJ SOLR
125.0000 mg | Freq: Once | INTRAMUSCULAR | Status: AC
  Administered 2018-01-20: 125 mg via INTRAVENOUS

## 2018-01-20 MED ORDER — DIPHENHYDRAMINE HCL 25 MG PO CAPS
50.0000 mg | ORAL_CAPSULE | Freq: Once | ORAL | Status: AC
  Administered 2018-01-20: 50 mg via ORAL

## 2018-01-20 MED ORDER — ACETAMINOPHEN 325 MG PO TABS
650.0000 mg | ORAL_TABLET | Freq: Once | ORAL | Status: AC
  Administered 2018-01-20: 650 mg via ORAL

## 2018-01-20 MED ORDER — HEPARIN SOD (PORK) LOCK FLUSH 100 UNIT/ML IV SOLN
500.0000 [IU] | Freq: Once | INTRAVENOUS | Status: AC | PRN
  Administered 2018-01-20: 500 [IU]
  Filled 2018-01-20: qty 5

## 2018-01-20 MED ORDER — SODIUM CHLORIDE 0.9% FLUSH
10.0000 mL | INTRAVENOUS | Status: DC | PRN
  Administered 2018-01-20: 10 mL
  Filled 2018-01-20: qty 10

## 2018-01-20 MED ORDER — ACETAMINOPHEN 325 MG PO TABS
ORAL_TABLET | ORAL | Status: AC
  Filled 2018-01-20: qty 2

## 2018-01-20 MED ORDER — DENOSUMAB 120 MG/1.7ML ~~LOC~~ SOLN: 120.0000 mg | Freq: Once | SUBCUTANEOUS | Status: DC

## 2018-01-20 NOTE — Progress Notes (Signed)
Pt instructed per order of Dr. Marin Olp to start Calcium 600 mg po daily.  Pt verbalizes an understanding.

## 2018-01-20 NOTE — Patient Instructions (Signed)
Implanted Port Insertion, Care After  This sheet gives you information about how to care for yourself after your procedure. Your health care provider may also give you more specific instructions. If you have problems or questions, contact your health care provider.  What can I expect after the procedure?  After the procedure, it is common to have:  · Discomfort at the port insertion site.  · Bruising on the skin over the port. This should improve over 3-4 days.  Follow these instructions at home:  Port care  · After your port is placed, you will get a manufacturer's information card. The card has information about your port. Keep this card with you at all times.  · Take care of the port as told by your health care provider. Ask your health care provider if you or a family member can get training for taking care of the port at home. A home health care nurse may also take care of the port.  · Make sure to remember what type of port you have.  Incision care         · Follow instructions from your health care provider about how to take care of your port insertion site. Make sure you:  ? Wash your hands with soap and water before and after you change your bandage (dressing). If soap and water are not available, use hand sanitizer.  ? Change your dressing as told by your health care provider.  ? Leave stitches (sutures), skin glue, or adhesive strips in place. These skin closures may need to stay in place for 2 weeks or longer. If adhesive strip edges start to loosen and curl up, you may trim the loose edges. Do not remove adhesive strips completely unless your health care provider tells you to do that.  · Check your port insertion site every day for signs of infection. Check for:  ? Redness, swelling, or pain.  ? Fluid or blood.  ? Warmth.  ? Pus or a bad smell.  Activity  · Return to your normal activities as told by your health care provider. Ask your health care provider what activities are safe for you.  · Do not  lift anything that is heavier than 10 lb (4.5 kg), or the limit that you are told, until your health care provider says that it is safe.  General instructions  · Take over-the-counter and prescription medicines only as told by your health care provider.  · Do not take baths, swim, or use a hot tub until your health care provider approves. Ask your health care provider if you may take showers. You may only be allowed to take sponge baths.  · Do not drive for 24 hours if you were given a sedative during your procedure.  · Wear a medical alert bracelet in case of an emergency. This will tell any health care providers that you have a port.  · Keep all follow-up visits as told by your health care provider. This is important.  Contact a health care provider if:  · You cannot flush your port with saline as directed, or you cannot draw blood from the port.  · You have a fever or chills.  · You have redness, swelling, or pain around your port insertion site.  · You have fluid or blood coming from your port insertion site.  · Your port insertion site feels warm to the touch.  · You have pus or a bad smell coming from the port   insertion site.  Get help right away if:  · You have chest pain or shortness of breath.  · You have bleeding from your port that you cannot control.  Summary  · Take care of the port as told by your health care provider. Keep the manufacturer's information card with you at all times.  · Change your dressing as told by your health care provider.  · Contact a health care provider if you have a fever or chills or if you have redness, swelling, or pain around your port insertion site.  · Keep all follow-up visits as told by your health care provider.  This information is not intended to replace advice given to you by your health care provider. Make sure you discuss any questions you have with your health care provider.  Document Released: 10/29/2012 Document Revised: 08/06/2017 Document Reviewed:  08/06/2017  Elsevier Interactive Patient Education © 2019 [employer]

## 2018-01-20 NOTE — Patient Instructions (Signed)
Daratumumab injection What is this medicine? DARATUMUMAB (dar a toom ue mab) is a monoclonal antibody. It is used to treat multiple myeloma. This medicine may be used for other purposes; ask your health care provider or pharmacist if you have questions. COMMON BRAND NAME(S): DARZALEX What should I tell my health care provider before I take this medicine? They need to know if you have any of these conditions: -infection (especially a virus infection such as chickenpox, herpes, or hepatitis B virus) -lung or breathing disease -an unusual or allergic reaction to daratumumab, other medicines, foods, dyes, or preservatives -pregnant or trying to get pregnant -breast-feeding How should I use this medicine? This medicine is for infusion into a vein. It is given by a health care professional in a hospital or clinic setting. Talk to your pediatrician regarding the use of this medicine in children. Special care may be needed. Overdosage: If you think you have taken too much of this medicine contact a poison control center or emergency room at once. NOTE: This medicine is only for you. Do not share this medicine with others. What if I miss a dose? Keep appointments for follow-up doses as directed. It is important not to miss your dose. Call your doctor or health care professional if you are unable to keep an appointment. What may interact with this medicine? Interactions have not been studied. Give your health care provider a list of all the medicines, herbs, non-prescription drugs, or dietary supplements you use. Also tell them if you smoke, drink alcohol, or use illegal drugs. Some items may interact with your medicine. This list may not describe all possible interactions. Give your health care provider a list of all the medicines, herbs, non-prescription drugs, or dietary supplements you use. Also tell them if you smoke, drink alcohol, or use illegal drugs. Some items may interact with your  medicine. What should I watch for while using this medicine? This drug may make you feel generally unwell. Report any side effects. Continue your course of treatment even though you feel ill unless your doctor tells you to stop. This medicine can cause serious allergic reactions. To reduce your risk you may need to take medicine before treatment with this medicine. Take your medicine as directed. This medicine can affect the results of blood tests to match your blood type. These changes can last for up to 6 months after the final dose. Your healthcare provider will do blood tests to match your blood type before you start treatment. Tell all of your healthcare providers that you are being treated with this medicine before receiving a blood transfusion. This medicine can affect the results of some tests used to determine treatment response; extra tests may be needed to evaluate response. Do not become pregnant while taking this medicine or for 3 months after stopping it. Women should inform their doctor if they wish to become pregnant or think they might be pregnant. There is a potential for serious side effects to an unborn child. Talk to your health care professional or pharmacist for more information. What side effects may I notice from receiving this medicine? Side effects that you should report to your doctor or health care professional as soon as possible: -allergic reactions like skin rash, itching or hives, swelling of the face, lips, or tongue -breathing problems -chills -cough -dizziness -feeling faint or lightheaded -headache -low blood counts - this medicine may decrease the number of white blood cells, red blood cells and platelets. You may be at increased   risk for infections and bleeding. -nausea, vomiting -shortness of breath -signs of decreased platelets or bleeding - bruising, pinpoint red spots on the skin, black, tarry stools, blood in the urine -signs of decreased red blood  cells - unusually weak or tired, feeling faint or lightheaded, falls -signs of infection - fever or chills, cough, sore throat, pain or difficulty passing urine -signs and symptoms of liver injury like dark yellow or brown urine; general ill feeling or flu-like symptoms; light-colored stools; loss of appetite; right upper belly pain; unusually weak or tired; yellowing of the eyes or skin Side effects that usually do not require medical attention (report to your doctor or health care professional if they continue or are bothersome): -back pain -constipation -loss of appetite -diarrhea -joint pain -muscle cramps -pain, tingling, numbness in the hands or feet -swelling of the ankles, feet, hands -tiredness -trouble sleeping This list may not describe all possible side effects. Call your doctor for medical advice about side effects. You may report side effects to FDA at 1-800-FDA-1088. Where should I keep my medicine? Keep out of the reach of children. This drug is given in a hospital or clinic and will not be stored at home. NOTE: This sheet is a summary. It may not cover all possible information. If you have questions about this medicine, talk to your doctor, pharmacist, or health care provider.  2019 Elsevier/Gold Standard (2017-08-09 15:52:44)

## 2018-01-20 NOTE — Progress Notes (Signed)
Hematology and Oncology Follow Up Visit  Gene Hill [id number] 1940/03/04 77 y.o. 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  He and his wife had a wonderful Thanksgiving and Christmas.  He really has done well this decade.  He has been dealing with the myeloma for over 8 years.  He has not been able to do a lot of gardening.  The weather has been too wet for him.  His back is doing a little bit better.  He had epidural injections last week.  His myeloma has been holding pretty steady.  His last myeloma studies showed an M spike of 0.4 g/dL.  His IgA level was 742 mg/dL.  His kappa light chain was 2.4 mg/dL.  Everything is holding pretty steady.  He has had no issues with bowels or bladder.  He has had no fever.  He has had no cough or shortness of breath.  He has had no rashes.  There is been no leg swelling.    Overall, his performance status is ECOG 1   Medications:  Allergies as of 01/20/2018   No Known Allergies     Medication List       Accurate as of January 20, 2018  8:18 AM. Always use your most recent med list.        acetaminophen 325 MG tablet Commonly known as:  TYLENOL Take 650 mg by mouth every 6 (six) hours as needed. Alternates with his other pain killers   aspirin 325 MG tablet Take 325 mg by mouth daily.   DARZALEX IV Inject into the vein every 30 (thirty) days.   dexamethasone 2 MG tablet Commonly known as:  DECADRON TAKE 2 TABLETS BY MOUTH DAY AFTER CHEMO THEN 1 TABLET EVERY DAY FOR 4 DAYS   Fish Oil 1200 MG Caps Take 1,200 mg by mouth daily.   ibuprofen 200 MG tablet Commonly known as:   ADVIL,MOTRIN Take 200 mg by mouth every 6 (six) hours as needed. Takes 3 tablets total 600 mg for pain as needed. Alternates with other pain killers.   lidocaine 5 % ointment Commonly known as:  XYLOCAINE Apply 1 application topically 2 (two) times daily as needed. Mix w/ OTC Hydrocortisone 1 %   methocarbamol 500 MG tablet Commonly known as:  ROBAXIN Take 1 tablet (500 mg total) by mouth 2 (two) times daily as needed for muscle spasms.   metoprolol tartrate 100 MG tablet Commonly known as:  LOPRESSOR Take 0.5 tablets (50 mg total) by mouth 2 (two) times daily.   multivitamin tablet Take 1 tablet by mouth daily.   ondansetron 4 MG tablet Commonly known as:  ZOFRAN TAKE 1 TABLET BY MOUTH 1 HOUR BEFORE NINLARO, THEN EVERY 8 HOURS AS NEEDED FOR NAUSEA   oxyCODONE 5 MG immediate release tablet Commonly known as:  Oxy IR/ROXICODONE Take 1 tablet (5 mg total) by mouth every 6 (six) hours as needed for severe pain.   pantoprazole 40 MG tablet Commonly known as:  PROTONIX Take 1 tablet (40 mg total) by mouth daily.   PROBIOTIC PO Take  1 tablet by mouth daily.   traMADol 50 MG tablet Commonly known as:  ULTRAM Take 0.5-1 tablets (25-50 mg total) by mouth 2 (two) times daily as needed.       Allergies: No Known Allergies  Past Medical History, Surgical history, Social history, and Family History were reviewed and updated.  Review of Systems: Review of Systems  Constitutional: Negative.   HENT: Negative.   Eyes: Negative.   Respiratory: Negative.   Cardiovascular: Negative.   Gastrointestinal: Negative.   Genitourinary: Negative.   Musculoskeletal: Positive for back pain and joint pain.  Skin: Negative.   Neurological: Negative.   Endo/Heme/Allergies: Negative.   Psychiatric/Behavioral: Negative.       Physical Exam:  weight is 217 lb (98.4 kg). His oral temperature is 98.4 F (36.9 C). His blood pressure is 129/75 and his pulse is 72. His respiration is 18 and  oxygen saturation is 97%.   Wt Readings from Last 3 Encounters:  01/20/18 217 lb (98.4 kg)  12/09/17 210 lb (95.3 kg)  10/28/17 214 lb 1.3 oz (97.1 kg)    Physical Exam Vitals signs reviewed.  HENT:     Head: Normocephalic and atraumatic.  Eyes:     Pupils: Pupils are equal, round, and reactive to light.  Neck:     Musculoskeletal: Normal range of motion.  Cardiovascular:     Rate and Rhythm: Normal rate and regular rhythm.     Heart sounds: Normal heart sounds.  Pulmonary:     Effort: Pulmonary effort is normal.     Breath sounds: Normal breath sounds.  Abdominal:     General: Bowel sounds are normal.     Palpations: Abdomen is soft.  Musculoskeletal: Normal range of motion.        General: No tenderness or deformity.  Lymphadenopathy:     Cervical: No cervical adenopathy.  Skin:    General: Skin is warm and dry.     Findings: No erythema or rash.  Neurological:     Mental Status: He is alert and oriented to person, place, and time.  Psychiatric:        Behavior: Behavior normal.        Thought Content: Thought content normal.        Judgment: Judgment normal.      Lab Results  Component Value Date   WBC 10.1 12/09/2017   HGB 12.7 (L) 12/09/2017   HCT 38.7 (L) 12/09/2017   MCV 101.8 (H) 12/09/2017   PLT 209 12/09/2017   Lab Results  Component Value Date   FERRITIN 351 (H) 06/26/2011   IRON 75 06/26/2011   TIBC 353 06/26/2011   UIBC 278 06/26/2011   IRONPCTSAT 21 06/26/2011   Lab Results  Component Value Date   RBC 3.80 (L) 12/09/2017   Lab Results  Component Value Date   KPAFRELGTCHN 24.2 (H) 12/09/2017   LAMBDASER 2.5 (L) 12/09/2017   KAPLAMBRATIO 9.68 (H) 12/09/2017   Lab Results  Component Value Date   IGGSERUM 271 (L) 12/09/2017   IGA 742 (H) 12/09/2017   IGMSERUM 7 (L) 12/09/2017   Lab Results  Component Value Date   TOTALPROTELP 6.7 12/09/2017   ALBUMINELP 3.6 12/09/2017   A1GS 0.2 12/09/2017   A2GS 1.2 (H) 12/09/2017   BETS 1.0  12/09/2017   BETA2SER 2.0 (H) 12/08/2014   GAMS 0.7 12/09/2017   MSPIKE 0.4 (H) 12/09/2017   SPEI Comment 09/02/2017     Chemistry      Component Value Date/Time  NA 145 12/09/2017 0847   NA 141 01/21/2017 0741   NA 139 06/14/2016 1246   K 3.9 12/09/2017 0847   K 4.0 01/21/2017 0741   K 3.8 06/14/2016 1246   CL 106 12/09/2017 0847   CL 103 01/21/2017 0741   CO2 27 12/09/2017 0847   CO2 30 01/21/2017 0741   CO2 24 06/14/2016 1246   BUN 16 12/09/2017 0847   BUN 20 01/21/2017 0741   BUN 15.0 06/14/2016 1246   CREATININE 0.90 12/09/2017 0847   CREATININE 1.0 01/21/2017 0741   CREATININE 0.9 06/14/2016 1246      Component Value Date/Time   CALCIUM 9.3 12/09/2017 0847   CALCIUM 9.0 01/21/2017 0741   CALCIUM 9.6 06/14/2016 1246   ALKPHOS 53 12/09/2017 0847   ALKPHOS 37 01/21/2017 0741   ALKPHOS 54 06/14/2016 1246   AST 23 12/09/2017 0847   AST 25 06/14/2016 1246   ALT 24 12/09/2017 0847   ALT 29 01/21/2017 0741   ALT 21 06/14/2016 1246   BILITOT 0.6 12/09/2017 0847   BILITOT 0.37 06/14/2016 1246      Impression and Plan: Gene Hill is a very pleasant 77 yo caucasian gentleman with history of IgA kappa myeloma. He had high risk cytogenetics with a 17p- abnormality.   Overall, I really think that he is done well.  His myeloma studies have looked stable.  Hopefully, we will find that his myeloma levels are stable.  I think the every 6-week treatment is doing well for him.  We will get him back in 6 more weeks.  When we see him back, we will do the Xgeva.   [name], MD 12/30/20198:18 AM

## 2018-01-21 LAB — KAPPA/LAMBDA LIGHT CHAINS
KAPPA FREE LGHT CHN: 15.4 mg/L (ref 3.3–19.4)
Kappa, lambda light chain ratio: 8.11 — ABNORMAL HIGH (ref 0.26–1.65)
LAMDA FREE LIGHT CHAINS: 1.9 mg/L — AB (ref 5.7–26.3)

## 2018-01-21 LAB — IGG, IGA, IGM
IgA: 693 mg/dL — ABNORMAL HIGH (ref 61–437)
IgG (Immunoglobin G), Serum: 213 mg/dL — ABNORMAL LOW (ref 700–1600)
IgM (Immunoglobulin M), Srm: <5 mg/dL — ABNORMAL LOW (ref 15–143)

## 2018-01-24 LAB — PROTEIN ELECTROPHORESIS, SERUM, WITH REFLEX
A/G RATIO SPE: 1.3 (ref 0.7–1.7)
ALBUMIN ELP: 3.4 g/dL (ref 2.9–4.4)
ALPHA-1-GLOBULIN: 0.2 g/dL (ref 0.0–0.4)
Alpha-2-Globulin: 0.9 g/dL (ref 0.4–1.0)
Beta Globulin: 0.8 g/dL (ref 0.7–1.3)
GAMMA GLOBULIN: 0.8 g/dL (ref 0.4–1.8)
Globulin, Total: 2.7 g/dL (ref 2.2–3.9)
M-Spike, %: 0.4 g/dL — ABNORMAL HIGH
SPEP INTERP: 0
TOTAL PROTEIN ELP: 6.1 g/dL (ref 6.0–8.5)

## 2018-01-24 LAB — IMMUNOFIXATION REFLEX, SERUM
IGA: 776 mg/dL — AB (ref 61–437)
IgG (Immunoglobin G), Serum: 241 mg/dL — ABNORMAL LOW (ref 700–1600)
IgM (Immunoglobulin M), Srm: 5 mg/dL — ABNORMAL LOW (ref 15–143)

## 2018-01-27 ENCOUNTER — Telehealth: Payer: Self-pay | Admitting: *Deleted

## 2018-01-27 NOTE — Telephone Encounter (Signed)
As noted below by Dr. Marin Olp, I informed the wife of patient's lab results. She verbalized understanding.

## 2018-01-27 NOTE — Telephone Encounter (Signed)
-----   Message from Gene Napoleon, MD sent at 01/24/2018  1:42 PM EST ----- Call - the myeloma level is holding stable at 0.4!!!  Happy New Year!!  Gene Hill

## 2018-02-11 DIAGNOSIS — M545 Low back pain: Secondary | ICD-10-CM | POA: Diagnosis not present

## 2018-02-11 DIAGNOSIS — M5136 Other intervertebral disc degeneration, lumbar region: Secondary | ICD-10-CM | POA: Diagnosis not present

## 2018-02-11 DIAGNOSIS — M47816 Spondylosis without myelopathy or radiculopathy, lumbar region: Secondary | ICD-10-CM | POA: Diagnosis not present

## 2018-03-03 ENCOUNTER — Inpatient Hospital Stay: Payer: PPO

## 2018-03-03 ENCOUNTER — Other Ambulatory Visit: Payer: Self-pay | Admitting: *Deleted

## 2018-03-03 ENCOUNTER — Inpatient Hospital Stay: Payer: PPO | Attending: Hematology & Oncology | Admitting: Hematology & Oncology

## 2018-03-03 ENCOUNTER — Other Ambulatory Visit: Payer: Self-pay

## 2018-03-03 ENCOUNTER — Encounter: Payer: Self-pay | Admitting: Hematology & Oncology

## 2018-03-03 VITALS — BP 118/69 | HR 69 | Temp 98.3°F

## 2018-03-03 VITALS — BP 135/69 | HR 73 | Temp 98.4°F | Resp 18 | Wt 216.0 lb

## 2018-03-03 DIAGNOSIS — Z5111 Encounter for antineoplastic chemotherapy: Secondary | ICD-10-CM | POA: Diagnosis not present

## 2018-03-03 DIAGNOSIS — C9000 Multiple myeloma not having achieved remission: Secondary | ICD-10-CM

## 2018-03-03 DIAGNOSIS — C9002 Multiple myeloma in relapse: Secondary | ICD-10-CM | POA: Diagnosis not present

## 2018-03-03 DIAGNOSIS — Z9481 Bone marrow transplant status: Secondary | ICD-10-CM

## 2018-03-03 DIAGNOSIS — M546 Pain in thoracic spine: Secondary | ICD-10-CM

## 2018-03-03 DIAGNOSIS — Z79899 Other long term (current) drug therapy: Secondary | ICD-10-CM | POA: Diagnosis not present

## 2018-03-03 LAB — CMP (CANCER CENTER ONLY)
ALT: 17 U/L (ref 0–44)
AST: 17 U/L (ref 15–41)
Albumin: 4.2 g/dL (ref 3.5–5.0)
Alkaline Phosphatase: 30 U/L — ABNORMAL LOW (ref 38–126)
Anion gap: 8 (ref 5–15)
BUN: 21 mg/dL (ref 8–23)
CO2: 28 mmol/L (ref 22–32)
Calcium: 9.4 mg/dL (ref 8.9–10.3)
Chloride: 103 mmol/L (ref 98–111)
Creatinine: 0.98 mg/dL (ref 0.61–1.24)
GFR, Est AFR Am: >60 mL/min (ref >60)
GFR, Estimated: >60 mL/min (ref >60)
GLUCOSE: 106 mg/dL — AB (ref 70–99)
Potassium: 4 mmol/L (ref 3.5–5.1)
Sodium: 139 mmol/L (ref 135–145)
Total Bilirubin: 0.4 mg/dL (ref 0.3–1.2)
Total Protein: 6.4 g/dL — ABNORMAL LOW (ref 6.5–8.1)

## 2018-03-03 LAB — LACTATE DEHYDROGENASE: LDH: 144 U/L (ref 98–192)

## 2018-03-03 LAB — CBC WITH DIFFERENTIAL (CANCER CENTER ONLY)
Abs Immature Granulocytes: 0.01 10*3/uL (ref 0.00–0.07)
BASOS ABS: 0 10*3/uL (ref 0.0–0.1)
Basophils Relative: 1 %
Eosinophils Absolute: 0.1 10*3/uL (ref 0.0–0.5)
Eosinophils Relative: 2 %
HCT: 38 % — ABNORMAL LOW (ref 39.0–52.0)
Hemoglobin: 12.4 g/dL — ABNORMAL LOW (ref 13.0–17.0)
Immature Granulocytes: 0 %
LYMPHS PCT: 29 %
Lymphs Abs: 1.4 10*3/uL (ref 0.7–4.0)
MCH: 33.8 pg (ref 26.0–34.0)
MCHC: 32.6 g/dL (ref 30.0–36.0)
MCV: 103.5 fL — ABNORMAL HIGH (ref 80.0–100.0)
Monocytes Absolute: 0.7 10*3/uL (ref 0.1–1.0)
Monocytes Relative: 14 %
NRBC: 0 % (ref 0.0–0.2)
Neutro Abs: 2.6 10*3/uL (ref 1.7–7.7)
Neutrophils Relative %: 54 %
Platelet Count: 175 10*3/uL (ref 150–400)
RBC: 3.67 MIL/uL — ABNORMAL LOW (ref 4.22–5.81)
RDW: 13.9 % (ref 11.5–15.5)
WBC Count: 4.8 10*3/uL (ref 4.0–10.5)

## 2018-03-03 MED ORDER — DIPHENHYDRAMINE HCL 25 MG PO CAPS
50.0000 mg | ORAL_CAPSULE | Freq: Once | ORAL | Status: AC
  Administered 2018-03-03: 50 mg via ORAL

## 2018-03-03 MED ORDER — DENOSUMAB 120 MG/1.7ML ~~LOC~~ SOLN
120.0000 mg | Freq: Once | SUBCUTANEOUS | Status: AC
  Administered 2018-03-03: 120 mg via SUBCUTANEOUS

## 2018-03-03 MED ORDER — PROCHLORPERAZINE MALEATE 10 MG PO TABS
ORAL_TABLET | ORAL | Status: AC
  Filled 2018-03-03: qty 1

## 2018-03-03 MED ORDER — ACETAMINOPHEN 325 MG PO TABS
650.0000 mg | ORAL_TABLET | Freq: Once | ORAL | Status: AC
  Administered 2018-03-03: 650 mg via ORAL

## 2018-03-03 MED ORDER — SODIUM CHLORIDE 0.9 % IV SOLN
Freq: Once | INTRAVENOUS | Status: AC
  Administered 2018-03-03: 09:00:00 via INTRAVENOUS
  Filled 2018-03-03: qty 250

## 2018-03-03 MED ORDER — PROCHLORPERAZINE MALEATE 10 MG PO TABS
10.0000 mg | ORAL_TABLET | Freq: Once | ORAL | Status: AC
  Administered 2018-03-03: 10 mg via ORAL

## 2018-03-03 MED ORDER — DENOSUMAB 120 MG/1.7ML ~~LOC~~ SOLN
SUBCUTANEOUS | Status: AC
  Filled 2018-03-03: qty 1.7

## 2018-03-03 MED ORDER — ACETAMINOPHEN 325 MG PO TABS
ORAL_TABLET | ORAL | Status: AC
  Filled 2018-03-03: qty 2

## 2018-03-03 MED ORDER — SODIUM CHLORIDE 0.9% FLUSH
10.0000 mL | Freq: Once | INTRAVENOUS | Status: AC
  Administered 2018-03-03: 10 mL
  Filled 2018-03-03: qty 10

## 2018-03-03 MED ORDER — OXYCODONE HCL 5 MG PO TABS: 5.0000 mg | ORAL_TABLET | Freq: Four times a day (QID) | ORAL | 0 refills | Status: DC | PRN

## 2018-03-03 MED ORDER — METHYLPREDNISOLONE SODIUM SUCC 125 MG IJ SOLR
INTRAMUSCULAR | Status: AC
  Filled 2018-03-03: qty 2

## 2018-03-03 MED ORDER — SODIUM CHLORIDE 0.9 % IV SOLN
15.7000 mg/kg | Freq: Once | INTRAVENOUS | Status: AC
  Administered 2018-03-03: 1600 mg via INTRAVENOUS
  Filled 2018-03-03: qty 80

## 2018-03-03 MED ORDER — HEPARIN SOD (PORK) LOCK FLUSH 100 UNIT/ML IV SOLN
500.0000 [IU] | Freq: Once | INTRAVENOUS | Status: AC | PRN
  Administered 2018-03-03: 500 [IU]
  Filled 2018-03-03: qty 5

## 2018-03-03 MED ORDER — SODIUM CHLORIDE 0.9% FLUSH
10.0000 mL | INTRAVENOUS | Status: DC | PRN
  Administered 2018-03-03: 10 mL
  Filled 2018-03-03: qty 10

## 2018-03-03 MED ORDER — DIPHENHYDRAMINE HCL 25 MG PO CAPS
ORAL_CAPSULE | ORAL | Status: AC
  Filled 2018-03-03: qty 1

## 2018-03-03 MED ORDER — METHYLPREDNISOLONE SODIUM SUCC 125 MG IJ SOLR
125.0000 mg | Freq: Once | INTRAMUSCULAR | Status: AC
  Administered 2018-03-03: 125 mg via INTRAVENOUS

## 2018-03-03 NOTE — Progress Notes (Signed)
Hematology and Oncology Follow Up Visit  Gene Hill [id number] 1940/11/26 78 y.o. 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  So far, he has been doing okay.  His main problem has been his back.  He has lower back issues.  He has had an MRI before.  There is nothing related to myeloma.  He has a disc issue.  He might need to have a epidural injection.  The weather really has affected his garden.  It is been too warm and too wet for him his winter vegetables to grow.  As far as his myeloma is concerned, he is done incredibly well.  His last M spike was 0.4 g/dL.  His IgA level was 700 mg/dL.  His kappa light chain was 0.2 mg/dL.  He has had no problems with cough.  He has had no change in bowel or bladder habits.  There is been no swelling in his legs.  He has had no rashes.  He has had no headache.      Overall, his performance status is ECOG 1   Medications:  Allergies as of 03/03/2018   No Known Allergies     Medication List       Accurate as of March 03, 2018  8:40 AM. Always use your most recent med list.        acetaminophen 325 MG tablet Commonly known as:  TYLENOL Take 650 mg by mouth every 6 (six) hours as needed. Alternates with his other pain killers   aspirin 325 MG tablet Take 325 mg by mouth daily.   calcium carbonate 1500 (600 Ca) MG Tabs tablet Commonly known as:  OSCAL Take by mouth 2 (two) times daily with a meal.   DARZALEX IV Inject into the vein every 30 (thirty) days.   dexamethasone 2 MG tablet Commonly known as:  DECADRON TAKE 2 TABLETS BY MOUTH DAY AFTER CHEMO THEN 1 TABLET EVERY DAY FOR 4 DAYS   Fish  Oil 1200 MG Caps Take 1,200 mg by mouth daily.   ibuprofen 200 MG tablet Commonly known as:  ADVIL,MOTRIN Take 200 mg by mouth every 6 (six) hours as needed. Takes 3 tablets total 600 mg for pain as needed. Alternates with other pain killers.   lidocaine 5 % ointment Commonly known as:  XYLOCAINE Apply 1 application topically 2 (two) times daily as needed. Mix w/ OTC Hydrocortisone 1 %   methocarbamol 500 MG tablet Commonly known as:  ROBAXIN Take 1 tablet (500 mg total) by mouth 2 (two) times daily as needed for muscle spasms.   metoprolol tartrate 100 MG tablet Commonly known as:  LOPRESSOR Take 0.5 tablets (50 mg total) by mouth 2 (two) times daily.   multivitamin tablet Take 1 tablet by mouth daily.   ondansetron 4 MG tablet Commonly known as:  ZOFRAN TAKE 1 TABLET BY MOUTH 1 HOUR BEFORE NINLARO, THEN EVERY 8 HOURS AS NEEDED FOR NAUSEA   oxyCODONE 5 MG immediate release tablet Commonly known as:  Oxy IR/ROXICODONE Take 1 tablet (5 mg total) by mouth every 6 (six) hours  as needed for severe pain.   pantoprazole 40 MG tablet Commonly known as:  PROTONIX Take 1 tablet (40 mg total) by mouth daily.   PROBIOTIC PO Take 1 tablet by mouth daily.   traMADol 50 MG tablet Commonly known as:  ULTRAM Take 0.5-1 tablets (25-50 mg total) by mouth 2 (two) times daily as needed.       Allergies: No Known Allergies  Past Medical History, Surgical history, Social history, and Family History were reviewed and updated.  Review of Systems: Review of Systems  Constitutional: Negative.   HENT: Negative.   Eyes: Negative.   Respiratory: Negative.   Cardiovascular: Negative.   Gastrointestinal: Negative.   Genitourinary: Negative.   Musculoskeletal: Positive for back pain and joint pain.  Skin: Negative.   Neurological: Negative.   Endo/Heme/Allergies: Negative.   Psychiatric/Behavioral: Negative.       Physical Exam:  vitals were not taken for this visit.   Wt Readings  from Last 3 Encounters:  01/20/18 217 lb (98.4 kg)  12/09/17 210 lb (95.3 kg)  10/28/17 214 lb 1.3 oz (97.1 kg)  Is  Physical Exam Vitals signs reviewed.  HENT:     Head: Normocephalic and atraumatic.  Eyes:     Pupils: Pupils are equal, round, and reactive to light.  Neck:     Musculoskeletal: Normal range of motion.  Cardiovascular:     Rate and Rhythm: Normal rate and regular rhythm.     Heart sounds: Normal heart sounds.  Pulmonary:     Effort: Pulmonary effort is normal.     Breath sounds: Normal breath sounds.  Abdominal:     General: Bowel sounds are normal.     Palpations: Abdomen is soft.  Musculoskeletal: Normal range of motion.        General: No tenderness or deformity.  Lymphadenopathy:     Cervical: No cervical adenopathy.  Skin:    General: Skin is warm and dry.     Findings: No erythema or rash.  Neurological:     Mental Status: He is alert and oriented to person, place, and time.  Psychiatric:        Behavior: Behavior normal.        Thought Content: Thought content normal.        Judgment: Judgment normal.      Lab Results  Component Value Date   WBC 5.9 01/20/2018   HGB 12.0 (L) 01/20/2018   HCT 38.3 (L) 01/20/2018   MCV 104.6 (H) 01/20/2018   PLT 178 01/20/2018   Lab Results  Component Value Date   FERRITIN 351 (H) 06/26/2011   IRON 75 06/26/2011   TIBC 353 06/26/2011   UIBC 278 06/26/2011   IRONPCTSAT 21 06/26/2011   Lab Results  Component Value Date   RBC 3.66 (L) 01/20/2018   Lab Results  Component Value Date   KPAFRELGTCHN 15.4 01/20/2018   LAMBDASER 1.9 (L) 01/20/2018   KAPLAMBRATIO 8.11 (H) 01/20/2018   Lab Results  Component Value Date   IGGSERUM 213 (L) 01/20/2018   IGGSERUM 241 (L) 01/20/2018   IGA 693 (H) 01/20/2018   IGA 776 (H) 01/20/2018   IGMSERUM <5 (L) 01/20/2018   IGMSERUM 5 (L) 01/20/2018   Lab Results  Component Value Date   TOTALPROTELP 6.1 01/20/2018   ALBUMINELP 3.4 01/20/2018   A1GS 0.2  01/20/2018   A2GS 0.9 01/20/2018   BETS 0.8 01/20/2018   BETA2SER 2.0 (H) 12/08/2014   GAMS 0.8 01/20/2018   MSPIKE 0.4 (  H) 01/20/2018   SPEI Comment 09/02/2017     Chemistry      Component Value Date/Time   NA 140 01/20/2018 0818   NA 141 01/21/2017 0741   NA 139 06/14/2016 1246   K 3.7 01/20/2018 0818   K 4.0 01/21/2017 0741   K 3.8 06/14/2016 1246   CL 105 01/20/2018 0818   CL 103 01/21/2017 0741   CO2 27 01/20/2018 0818   CO2 30 01/21/2017 0741   CO2 24 06/14/2016 1246   BUN 18 01/20/2018 0818   BUN 20 01/21/2017 0741   BUN 15.0 06/14/2016 1246   CREATININE 0.83 01/20/2018 0818   CREATININE 1.0 01/21/2017 0741   CREATININE 0.9 06/14/2016 1246      Component Value Date/Time   CALCIUM 8.2 (L) 01/20/2018 0818   CALCIUM 9.0 01/21/2017 0741   CALCIUM 9.6 06/14/2016 1246   ALKPHOS 34 (L) 01/20/2018 0818   ALKPHOS 37 01/21/2017 0741   ALKPHOS 54 06/14/2016 1246   AST 13 (L) 01/20/2018 0818   AST 25 06/14/2016 1246   ALT 18 01/20/2018 0818   ALT 29 01/21/2017 0741   ALT 21 06/14/2016 1246   BILITOT 0.3 01/20/2018 0818   BILITOT 0.37 06/14/2016 1246      Impression and Plan: Gene Hill is a very pleasant 78 yo caucasian gentleman with history of IgA kappa myeloma. He had high risk cytogenetics with a 17p- abnormality.   Overall, I really think that he is done well.  His myeloma studies have looked stable.  Hopefully, we will find that his myeloma levels are stable.  I think the every 6-week treatment is doing well for him.  We will get him back in 6 more weeks.  [name], MD 2/10/20208:40 AM

## 2018-03-03 NOTE — Patient Instructions (Signed)

## 2018-03-03 NOTE — Patient Instructions (Signed)
[facility] Discharge Instructions for Patients Receiving Chemotherapy  Today you received the following chemotherapy agents:  Darzalex  To help prevent nausea and vomiting after your treatment, we encourage you to take your nausea medication as prescribed.   If you develop nausea and vomiting that is not controlled by your nausea medication, call the clinic.   BELOW ARE SYMPTOMS THAT SHOULD BE REPORTED IMMEDIATELY:  *FEVER GREATER THAN 100.5 F  *CHILLS WITH OR WITHOUT FEVER  NAUSEA AND VOMITING THAT IS NOT CONTROLLED WITH YOUR NAUSEA MEDICATION  *UNUSUAL SHORTNESS OF BREATH  *UNUSUAL BRUISING OR BLEEDING  TENDERNESS IN MOUTH AND THROAT WITH OR WITHOUT PRESENCE OF ULCERS  *URINARY PROBLEMS  *BOWEL PROBLEMS  UNUSUAL RASH Items with * indicate a potential emergency and should be followed up as soon as possible.  Feel free to call the clinic should you have any questions or concerns. The clinic phone number is [phone].  Please show the CHEMO ALERT CARD at check-in to the Emergency Department and triage nurse.   

## 2018-03-04 DIAGNOSIS — M545 Low back pain: Secondary | ICD-10-CM | POA: Diagnosis not present

## 2018-03-04 DIAGNOSIS — M47816 Spondylosis without myelopathy or radiculopathy, lumbar region: Secondary | ICD-10-CM | POA: Diagnosis not present

## 2018-03-04 DIAGNOSIS — M5136 Other intervertebral disc degeneration, lumbar region: Secondary | ICD-10-CM | POA: Diagnosis not present

## 2018-03-04 LAB — KAPPA/LAMBDA LIGHT CHAINS
Kappa free light chain: 17.6 mg/L (ref 3.3–19.4)
Kappa, lambda light chain ratio: 11 — ABNORMAL HIGH (ref 0.26–1.65)
Lambda free light chains: 1.6 mg/L — ABNORMAL LOW (ref 5.7–26.3)

## 2018-03-04 LAB — IGG, IGA, IGM
IgA: 785 mg/dL — ABNORMAL HIGH (ref 61–437)
IgG (Immunoglobin G), Serum: 197 mg/dL — ABNORMAL LOW (ref 700–1600)
IgM (Immunoglobulin M), Srm: 6 mg/dL — ABNORMAL LOW (ref 15–143)

## 2018-03-06 ENCOUNTER — Other Ambulatory Visit: Payer: Self-pay | Admitting: Orthopedic Surgery

## 2018-03-06 DIAGNOSIS — M25511 Pain in right shoulder: Secondary | ICD-10-CM

## 2018-03-09 ENCOUNTER — Ambulatory Visit
Admission: RE | Admit: 2018-03-09 | Discharge: 2018-03-09 | Disposition: A | Discharge status: Home / Self Care | Payer: PPO | Source: Ambulatory Visit | Attending: Orthopedic Surgery | Admitting: Orthopedic Surgery

## 2018-03-09 DIAGNOSIS — M25511 Pain in right shoulder: Secondary | ICD-10-CM | POA: Diagnosis not present

## 2018-03-11 DIAGNOSIS — M25511 Pain in right shoulder: Secondary | ICD-10-CM | POA: Diagnosis not present

## 2018-03-12 LAB — IMMUNOFIXATION REFLEX, SERUM
IgA: 793 mg/dL — ABNORMAL HIGH (ref 61–437)
IgG (Immunoglobin G), Serum: 203 mg/dL — ABNORMAL LOW (ref 700–1600)
IgM (Immunoglobulin M), Srm: <5 mg/dL — ABNORMAL LOW (ref 15–143)

## 2018-03-12 LAB — PROTEIN ELECTROPHORESIS, SERUM, WITH REFLEX
A/G Ratio: 1.3 (ref 0.7–1.7)
ALBUMIN ELP: 3.7 g/dL (ref 2.9–4.4)
ALPHA-2-GLOBULIN: 0.9 g/dL (ref 0.4–1.0)
Alpha-1-Globulin: 0.2 g/dL (ref 0.0–0.4)
Beta Globulin: 0.8 g/dL (ref 0.7–1.3)
Gamma Globulin: 0.8 g/dL (ref 0.4–1.8)
Globulin, Total: 2.8 g/dL (ref 2.2–3.9)
M-Spike, %: 0.4 g/dL — ABNORMAL HIGH
SPEP Interpretation: 0
Total Protein ELP: 6.5 g/dL (ref 6.0–8.5)

## 2018-03-19 ENCOUNTER — Telehealth: Payer: Self-pay

## 2018-03-19 DIAGNOSIS — M5136 Other intervertebral disc degeneration, lumbar region: Secondary | ICD-10-CM | POA: Diagnosis not present

## 2018-03-19 DIAGNOSIS — M545 Low back pain: Secondary | ICD-10-CM | POA: Diagnosis not present

## 2018-03-19 NOTE — Telephone Encounter (Signed)
Have you seen surgical clearance form? 

## 2018-03-19 NOTE — Telephone Encounter (Signed)
Copied from [place] [id number]. Topic: General - Inquiry >> Mar 19, 2018  9:52 AM Rutherford Nail, NT wrote: Reason for CRM: Patient's wife calling to check the status of Dr Larose Kells filling out a surgical clearance form for the patient to have shoulder surgery. States that it was sent over one day last week, unsure of exact date. Please advise.

## 2018-03-24 ENCOUNTER — Ambulatory Visit: Payer: PPO | Admitting: *Deleted

## 2018-03-25 ENCOUNTER — Other Ambulatory Visit: Payer: Self-pay | Admitting: Internal Medicine

## 2018-03-25 NOTE — Telephone Encounter (Signed)
Pt wife will have murphy wainer to refax form over

## 2018-03-27 ENCOUNTER — Ambulatory Visit (INDEPENDENT_AMBULATORY_CARE_PROVIDER_SITE_OTHER): Payer: PPO | Admitting: Internal Medicine

## 2018-03-27 ENCOUNTER — Encounter: Payer: Self-pay | Admitting: Internal Medicine

## 2018-03-27 VITALS — BP 128/80 | HR 88 | Temp 98.1°F | Resp 16 | Ht 66.0 in | Wt 216.1 lb

## 2018-03-27 DIAGNOSIS — Z01818 Encounter for other preprocedural examination: Secondary | ICD-10-CM

## 2018-03-27 DIAGNOSIS — Z0000 Encounter for general adult medical examination without abnormal findings: Secondary | ICD-10-CM | POA: Diagnosis not present

## 2018-03-27 DIAGNOSIS — R739 Hyperglycemia, unspecified: Secondary | ICD-10-CM | POA: Diagnosis not present

## 2018-03-27 DIAGNOSIS — E785 Hyperlipidemia, unspecified: Secondary | ICD-10-CM | POA: Diagnosis not present

## 2018-03-27 LAB — LIPID PANEL
Cholesterol: 241 mg/dL — ABNORMAL HIGH (ref 0–200)
HDL: 48.5 mg/dL (ref >39.00)
NONHDL: 192.16
TRIGLYCERIDES: 240 mg/dL — AB (ref 0.0–149.0)
Total CHOL/HDL Ratio: 5
VLDL: 48 mg/dL — ABNORMAL HIGH (ref 0.0–40.0)

## 2018-03-27 LAB — HEMOGLOBIN A1C: Hgb A1c MFr Bld: 6 % (ref 4.6–6.5)

## 2018-03-27 LAB — LDL CHOLESTEROL, DIRECT: Direct LDL: 156 mg/dL

## 2018-03-27 MED ORDER — ZOSTER VAC RECOMB ADJUVANTED 50 MCG/0.5ML IM SUSR: 0.5000 mL | Freq: Once | INTRAMUSCULAR | 1 refills | Status: AC

## 2018-03-27 NOTE — Progress Notes (Signed)
Pre visit review using our clinic review tool, if applicable. No additional management support is needed unless otherwise documented below in the visit note. 

## 2018-03-27 NOTE — Assessment & Plan Note (Signed)
Prediabetes: Check A1c HTN: On metoprolol. Hyperlipidemia: On no meds, check labs CAD: Remote CAD, 1998, asx DJD,  seen by Raliegh Ip, MRI of the back show evidence of disc bulging, responded well to 2 injections in August and December 2019.  They are considering a right shoulder replacement.  Current pain control is Tylenol occasional ibuprofen and oxycodone only for severe pain Multiple myeloma: Closely follow-up by oncology, last visit 03/03/2018, felt to be stable, treatments q 6 weeks  Preop clearance: Patient is doing well with no cardiorespiratory symptoms.  Last visit with cardiology was in 2017 and was cleared for surgery at the time, echo 11-2015 normal EF mild MR. Cardiovascular exam today show no murmur, EKG: NSR. Patient is cleared given lack of symptoms and stable EKG.  He understands risk of surgery related to comorbidities and age.  RTC 1 year.

## 2018-03-27 NOTE — Patient Instructions (Addendum)
Please schedule Medicare Wellness with Glenard Haring.   GO TO THE LAB : Get the blood work     GO TO THE FRONT DESK Schedule your next appointment   for a physical exam in 1 year

## 2018-03-27 NOTE — Progress Notes (Signed)
Subjective:    Patient ID: Gene Hill, male    DOB: 1940/12/01, 78 y.o.   MRN: [id number]  DOS:  03/27/2018 Type of visit - description: CPX, here with his wife. In general feels well. Has ongoing shoulder pain, in need of a shoulder replacement.  Review of Systems Denies chest pain, difficulty breathing, palpitations or lower extremity edema. No DOE with routine activities including walking upstairs to the second floor. Was dizzy at some point, he felt it was related to tramadol.  Symptoms were prominent in 2018, decreasing 2019 and no problems in 2020.  Denies any slurred speech, focal deficits. No LUTS  Other than above, a 14 point review of systems is negative    Past Medical History:  Diagnosis Date  . Arthritis   . 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 Blood dyscrasia    low WBC d/t multiple myeloma  . Blood transfusion    hx of  . Bulging lumbar disc    L2-3  . Carotid bruit    3-11 carotid u/s was  (-)  . 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 GERD (gastroesophageal reflux disease)   . Headache   . Heart murmur   . HYPERLIPIDEMIA 05/03/2006  . HYPERTENSION 05/03/2006  . 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 Torn rotator cuff 01/22/2017   left- [place]    Past Surgical History:  Procedure Laterality Date  . CERVICAL FUSION    . IR FLUORO GUIDE PORT INSERTION RIGHT  11/29/2016  . IR US GUIDE VASC ACCESS RIGHT  11/29/2016  . [place]   right  . ROTATOR CUFF REPAIR     right  . SPINAL FUSION    . TOTAL KNEE ARTHROPLASTY Right 12/23/2015   Procedure: TOTAL KNEE ARTHROPLASTY;  Surgeon: Earlie Server, MD;  Location: [facility];  Service: Orthopedics;  Laterality: Right;    Social History   Socioeconomic History  . Marital status: Married    Spouse name: Not on  file  . Number of children: 4  . Years of education: Not on file  . Highest education level: Not on file  Occupational History  . Occupation: retired  Scientific laboratory technician  . Financial resource strain: Not on file  . Food insecurity:    Worry: Not on file    Inability: Not on file  . Transportation needs:    Medical: Not on file    Non-medical: Not on file  Tobacco Use  . Smoking status: Former Smoker    Packs/day: 0.25    Years: 20.00    Pack years: 5.00    Types: Cigarettes    Start date: 04/28/1954    Last attempt to quit: 11/28/1974    Years since quitting: 43.3  . Smokeless tobacco: Never Used  . Tobacco comment: quit 35 years ago, 1976  Substance and Sexual Activity  . Alcohol use: Yes    Alcohol/week: 0.0 standard drinks    Comment: Rarely   . Drug use: No  . Sexual activity: Never  Lifestyle  . Physical activity:    Days per week: Not on file    Minutes per session: Not on file  . Stress: Not on file  Relationships  . Social connections:    Talks on phone: Not  on file    Gets together: Not on file    Attends religious service: Not on file    Active member of club or organization: Not on file    Attends meetings of clubs or organizations: Not on file    Relationship status: Not on file  . Intimate partner violence:    Fear of current or ex partner: Not on file    Emotionally abused: Not on file    Physically abused: Not on file    Forced sexual activity: Not on file  Other Topics Concern  . Not on file  Social History Narrative   Household-- pt, wife, son     Family History  Problem Relation Age of Onset  . Kidney cancer Father   . Leukemia Mother   . Pulmonary embolism Brother   . Stroke Maternal Grandmother   . Heart attack Maternal Grandfather   . Stroke Paternal Grandmother   . Cerebral palsy Son   . Prostate cancer Neg Hx   . Colon cancer Neg Hx   . Esophageal cancer Neg Hx   . Rectal cancer Neg Hx   . Stomach cancer Neg Hx      Allergies as of  03/27/2018   No Known Allergies     Medication List       Accurate as of March 27, 2018  8:40 PM. Always use your most recent med list.        acetaminophen 325 MG tablet Commonly known as:  TYLENOL Take 650 mg by mouth every 6 (six) hours as needed. Alternates with his other pain killers   aspirin 325 MG tablet Take 325 mg by mouth daily.   calcium carbonate 1500 (600 Ca) MG Tabs tablet Commonly known as:  OSCAL Take by mouth 2 (two) times daily with a meal.   DARZALEX IV Inject into the vein every 30 (thirty) days.   dexamethasone 2 MG tablet Commonly known as:  DECADRON TAKE 2 TABLETS BY MOUTH DAY AFTER CHEMO THEN 1 TABLET EVERY DAY FOR 4 DAYS   Fish Oil 1200 MG Caps Take 1,200 mg by mouth daily.   ibuprofen 200 MG tablet Commonly known as:  ADVIL,MOTRIN Take 200 mg by mouth every 6 (six) hours as needed. Takes 3 tablets total 600 mg for pain as needed. Alternates with other pain killers.   lidocaine 5 % ointment Commonly known as:  XYLOCAINE Apply 1 application topically 2 (two) times daily as needed. Mix w/ OTC Hydrocortisone 1 %   methocarbamol 500 MG tablet Commonly known as:  ROBAXIN Take 1 tablet (500 mg total) by mouth 2 (two) times daily as needed for muscle spasms.   metoprolol tartrate 100 MG tablet Commonly known as:  LOPRESSOR Take 0.5 tablets (50 mg total) by mouth 2 (two) times daily.   multivitamin tablet Take 1 tablet by mouth daily.   ondansetron 4 MG tablet Commonly known as:  ZOFRAN TAKE 1 TABLET BY MOUTH 1 HOUR BEFORE NINLARO, THEN EVERY 8 HOURS AS NEEDED FOR NAUSEA   oxyCODONE 5 MG immediate release tablet Commonly known as:  Oxy IR/ROXICODONE Take 1 tablet (5 mg total) by mouth every 6 (six) hours as needed for severe pain.   pantoprazole 40 MG tablet Commonly known as:  PROTONIX Take 1 tablet (40 mg total) by mouth daily.   PROBIOTIC PO Take 1 tablet by mouth daily.   vitamin B-12 1000 MCG tablet Commonly known as:   CYANOCOBALAMIN Take 1,000 mcg by mouth daily.  Zoster Vaccine Adjuvanted injection Commonly known as:  Shingrix Inject 0.5 mLs into the muscle once for 1 dose.           Objective:   Physical Exam BP 128/80 (BP Location: Left Arm, Patient Position: Sitting, Cuff Size: Small)   Pulse 88   Temp 98.1 F (36.7 C) (Oral)   Resp 16   Ht _0  (1.676 m)   Wt 216 lb 2 oz (98 kg)   SpO2 95%   BMI 34.88 kg/m  General: Well developed, NAD, BMI noted Neck: Bilateral palpable carotid arteries HEENT:  Normocephalic . Face symmetric, atraumatic Lungs:  CTA B Normal respiratory effort, no intercostal retractions, no accessory muscle use. Heart: RRR,  no murmur.  No pretibial edema bilaterally  Abdomen:  Not distended, soft, non-tender. No rebound or rigidity.   Skin: Exposed areas without rash. Not pale. Not jaundice Neurologic:  alert & oriented X3.  Speech normal, gait appropriate for age and unassisted Strength symmetric and appropriate for age.  Psych: Cognition and judgment appear intact.  Cooperative with normal attention span and concentration.  Behavior appropriate. No anxious or depressed appearing.     Assessment     Assessment Pre-diabetes HTN Hyperlipidemia (self dc  simva 11-2014)  CAD by cath 1998, medical management, Cardiolite ~ 2007 ? Multiple myeloma -- stem cell transplant 2011, with recurrence as off 01-2015  GI: --Barrett' esophagus EGD 2010, 2013 --PUD 2010  [facility] -- generalized aches, pains, chronic (no better after dc simvastatin ~ 11-2014) -- DJD Dr Percell Miller Hypogonadism Carotid bruit, [place] (-) 2011  PLAN Prediabetes: Check A1c HTN: On metoprolol. Hyperlipidemia: On no meds, check labs CAD: Remote CAD, 1998, asx DJD,  seen by Raliegh Ip, MRI of the back show evidence of disc bulging, responded well to 2 injections in August and December 2019.  They are considering a right shoulder replacement.  Current pain control is Tylenol occasional  ibuprofen and oxycodone only for severe pain Multiple myeloma: Closely follow-up by oncology, last visit 03/03/2018, felt to be stable, treatments q 6 weeks  Preop clearance: Patient is doing well with no cardiorespiratory symptoms.  Last visit with cardiology was in 2017 and was cleared for surgery at the time, echo 11-2015 normal EF mild MR. Cardiovascular exam today show no murmur, EKG: NSR. Patient is cleared given lack of symptoms and stable EKG.  He understands risk of surgery related to comorbidities and age.  RTC 1 year.

## 2018-03-27 NOTE — Assessment & Plan Note (Addendum)
-  Td 2013; pneumonia 23: 2008 and~ 2011 per Pt ; prevnar: 2016;  shingrix: Prescription printed, recommend to discuss with oncology. -No further prostate or colon cancer screening. See previous entries  - diet  and exercise discusse -Labs reviewed, we will get a FLP and A1c

## 2018-03-28 ENCOUNTER — Telehealth: Payer: Self-pay | Admitting: *Deleted

## 2018-03-28 NOTE — Telephone Encounter (Signed)
Received Medical/Surgical Clearance Form from [facility]; forwarded to provider/SLS 03/06

## 2018-03-28 NOTE — Telephone Encounter (Signed)
Never received form. Letter printed w/ last ov and EKG and faxed to Raliegh Ip at [phone].

## 2018-03-31 MED ORDER — ATORVASTATIN CALCIUM 10 MG PO TABS: 10.0000 mg | ORAL_TABLET | Freq: Every day | ORAL | 0 refills | Status: DC

## 2018-03-31 NOTE — Addendum Note (Signed)
Addended byDamita Dunnings D on: 03/31/2018 10:03 AM   Modules accepted: Orders

## 2018-04-03 NOTE — Patient Instructions (Addendum)
Gene Hill [facility]  04/03/2018   Your procedure is scheduled on: 04-16-2018  Report to [facility] Main  Entrance  Report to short stay at 530  AM    Call this number if you have problems the morning of surgery [phone]   Remember: Do not eat food or drink liquids :After Midnight. BRUSH YOUR TEETH MORNING OF SURGERY AND RINSE YOUR MOUTH OUT, NO CHEWING GUM CANDY OR MINTS.     Take these medicines the morning of surgery with A SIP OF WATER: oxycodone if needed, metoprolol tartrate, decadron                                            Men may shave face and neck.   Do not bring valuables to the hospital. [place].  Contacts, dentures or bridgework may not be worn into surgery.  Leave suitcase in the car. After surgery it may be brought to your room.   _____________________________________________________________________             [facility] - Preparing for Surgery Before surgery, you can play an important role.  Because skin is not sterile, your skin needs to be as free of germs as possible.  You can reduce the number of germs on your skin by washing with CHG (chlorahexidine gluconate) soap before surgery.  CHG is an antiseptic cleaner which kills germs and bonds with the skin to continue killing germs even after washing. Please DO NOT use if you have an allergy to CHG or antibacterial soaps.  If your skin becomes reddened/irritated stop using the CHG and inform your nurse when you arrive at Short Stay. Do not shave (including legs and underarms) for at least 48 hours prior to the first CHG shower.  You may shave your face/neck. Please follow these instructions carefully:  1.  Shower with CHG Soap the night before surgery and the  morning of Surgery.  2.  If you choose to wash your hair, wash your hair first as usual with your  normal  shampoo.  3.  After you shampoo, rinse your hair and body thoroughly to  remove the  shampoo.                           4.  Use CHG as you would any other liquid soap.  You can apply chg directly  to the skin and wash                       Gently with a scrungie or clean washcloth.  5.  Apply the CHG Soap to your body ONLY FROM THE NECK DOWN.   Do not use on face/ open                           Wound or open sores. Avoid contact with eyes, ears mouth and genitals (private parts).                       Wash face,  Genitals (private parts) with your normal soap.  6.  Wash thoroughly, paying special attention to the area where your surgery  will be performed.  7.  Thoroughly rinse your body with warm water from the neck down.  8.  DO NOT shower/wash with your normal soap after using and rinsing off  the CHG Soap.                9.  Pat yourself dry with a clean towel.            10.  Wear clean pajamas.            11.  Place clean sheets on your bed the night of your first shower and do not  sleep with pets. Day of Surgery : Do not apply any lotions/deodorants the morning of surgery.  Please wear clean clothes to the hospital/surgery center.  FAILURE TO FOLLOW THESE INSTRUCTIONS MAY RESULT IN THE CANCELLATION OF YOUR SURGERY PATIENT SIGNATURE_________________________________  NURSE SIGNATURE__________________________________  ________________________________________________________________________

## 2018-04-04 ENCOUNTER — Other Ambulatory Visit (HOSPITAL_COMMUNITY): Payer: Self-pay | Admitting: *Deleted

## 2018-04-04 NOTE — Progress Notes (Signed)
Medical clearance dr Larose Kells 03-27-2018 epix ekg 03-27-2018 epic

## 2018-04-07 ENCOUNTER — Encounter (HOSPITAL_COMMUNITY): Payer: Self-pay

## 2018-04-07 ENCOUNTER — Other Ambulatory Visit: Payer: Self-pay

## 2018-04-07 ENCOUNTER — Encounter (HOSPITAL_COMMUNITY)
Admission: RE | Admit: 2018-04-07 | Discharge: 2018-04-07 | Disposition: A | Discharge status: Home / Self Care | Payer: PPO | Source: Ambulatory Visit | Attending: Orthopaedic Surgery | Admitting: Orthopaedic Surgery

## 2018-04-07 DIAGNOSIS — Z01812 Encounter for preprocedural laboratory examination: Secondary | ICD-10-CM | POA: Diagnosis not present

## 2018-04-07 LAB — SURGICAL PCR SCREEN
MRSA, PCR: NEGATIVE
Staphylococcus aureus: NEGATIVE

## 2018-04-07 NOTE — Progress Notes (Signed)
Chart left with ehechi ewarum, rn for follow up of pcr results

## 2018-04-13 ENCOUNTER — Telehealth: Payer: Self-pay | Admitting: Hematology & Oncology

## 2018-04-13 NOTE — Telephone Encounter (Signed)
Called and spoke with patient's wife and pre screened he is all for treatment on 3/23

## 2018-04-14 ENCOUNTER — Inpatient Hospital Stay: Payer: PPO | Attending: Hematology & Oncology | Admitting: Hematology & Oncology

## 2018-04-14 ENCOUNTER — Other Ambulatory Visit: Payer: Self-pay | Admitting: *Deleted

## 2018-04-14 ENCOUNTER — Encounter: Payer: Self-pay | Admitting: Hematology & Oncology

## 2018-04-14 ENCOUNTER — Inpatient Hospital Stay: Payer: PPO

## 2018-04-14 ENCOUNTER — Other Ambulatory Visit: Payer: Self-pay

## 2018-04-14 VITALS — BP 132/80 | HR 78 | Temp 98.4°F | Resp 16 | Wt 217.0 lb

## 2018-04-14 VITALS — BP 158/80 | HR 78 | Temp 98.1°F | Resp 19

## 2018-04-14 DIAGNOSIS — C9000 Multiple myeloma not having achieved remission: Secondary | ICD-10-CM | POA: Diagnosis not present

## 2018-04-14 DIAGNOSIS — Z5111 Encounter for antineoplastic chemotherapy: Secondary | ICD-10-CM | POA: Insufficient documentation

## 2018-04-14 DIAGNOSIS — Z79899 Other long term (current) drug therapy: Secondary | ICD-10-CM | POA: Diagnosis not present

## 2018-04-14 LAB — CBC WITH DIFFERENTIAL (CANCER CENTER ONLY)
Abs Immature Granulocytes: 0.01 10*3/uL (ref 0.00–0.07)
Basophils Absolute: 0 10*3/uL (ref 0.0–0.1)
Basophils Relative: 1 %
EOS PCT: 2 %
Eosinophils Absolute: 0.1 10*3/uL (ref 0.0–0.5)
HCT: 37 % — ABNORMAL LOW (ref 39.0–52.0)
Hemoglobin: 12.1 g/dL — ABNORMAL LOW (ref 13.0–17.0)
Immature Granulocytes: 0 %
Lymphocytes Relative: 42 %
Lymphs Abs: 1.8 10*3/uL (ref 0.7–4.0)
MCH: 34 pg (ref 26.0–34.0)
MCHC: 32.7 g/dL (ref 30.0–36.0)
MCV: 103.9 fL — ABNORMAL HIGH (ref 80.0–100.0)
Monocytes Absolute: 0.6 10*3/uL (ref 0.1–1.0)
Monocytes Relative: 14 %
Neutro Abs: 1.7 10*3/uL (ref 1.7–7.7)
Neutrophils Relative %: 41 %
Platelet Count: 182 10*3/uL (ref 150–400)
RBC: 3.56 MIL/uL — ABNORMAL LOW (ref 4.22–5.81)
RDW: 13.3 % (ref 11.5–15.5)
WBC: 4.2 10*3/uL (ref 4.0–10.5)
nRBC: 0 % (ref 0.0–0.2)

## 2018-04-14 LAB — CMP (CANCER CENTER ONLY)
ALT: 19 U/L (ref 0–44)
AST: 16 U/L (ref 15–41)
Albumin: 4.2 g/dL (ref 3.5–5.0)
Alkaline Phosphatase: 29 U/L — ABNORMAL LOW (ref 38–126)
Anion gap: 9 (ref 5–15)
BUN: 15 mg/dL (ref 8–23)
CO2: 26 mmol/L (ref 22–32)
CREATININE: 0.93 mg/dL (ref 0.61–1.24)
Calcium: 8.9 mg/dL (ref 8.9–10.3)
Chloride: 106 mmol/L (ref 98–111)
GFR, Estimated: >60 mL/min (ref >60)
Glucose, Bld: 107 mg/dL — ABNORMAL HIGH (ref 70–99)
Potassium: 3.9 mmol/L (ref 3.5–5.1)
Sodium: 141 mmol/L (ref 135–145)
Total Bilirubin: 0.3 mg/dL (ref 0.3–1.2)
Total Protein: 6.2 g/dL — ABNORMAL LOW (ref 6.5–8.1)

## 2018-04-14 LAB — LACTATE DEHYDROGENASE: LDH: 171 U/L (ref 98–192)

## 2018-04-14 MED ORDER — ACETAMINOPHEN 325 MG PO TABS
650.0000 mg | ORAL_TABLET | Freq: Once | ORAL | Status: AC
  Administered 2018-04-14: 650 mg via ORAL

## 2018-04-14 MED ORDER — DIPHENHYDRAMINE HCL 25 MG PO CAPS
50.0000 mg | ORAL_CAPSULE | Freq: Once | ORAL | Status: AC
  Administered 2018-04-14: 50 mg via ORAL

## 2018-04-14 MED ORDER — HEPARIN SOD (PORK) LOCK FLUSH 100 UNIT/ML IV SOLN
500.0000 [IU] | Freq: Once | INTRAVENOUS | Status: AC | PRN
  Administered 2018-04-14: 500 [IU]
  Filled 2018-04-14: qty 5

## 2018-04-14 MED ORDER — PROCHLORPERAZINE MALEATE 10 MG PO TABS
10.0000 mg | ORAL_TABLET | Freq: Once | ORAL | Status: AC
  Administered 2018-04-14: 10 mg via ORAL

## 2018-04-14 MED ORDER — ACETAMINOPHEN 325 MG PO TABS
ORAL_TABLET | ORAL | Status: AC
  Filled 2018-04-14: qty 2

## 2018-04-14 MED ORDER — SODIUM CHLORIDE 0.9% FLUSH
10.0000 mL | Freq: Once | INTRAVENOUS | Status: AC
  Administered 2018-04-14: 10 mL
  Filled 2018-04-14: qty 10

## 2018-04-14 MED ORDER — OXYCODONE HCL 5 MG PO TABS: 5.0000 mg | ORAL_TABLET | Freq: Four times a day (QID) | ORAL | 0 refills | Status: DC | PRN

## 2018-04-14 MED ORDER — METHYLPREDNISOLONE SODIUM SUCC 125 MG IJ SOLR
INTRAMUSCULAR | Status: AC
  Filled 2018-04-14: qty 2

## 2018-04-14 MED ORDER — PROCHLORPERAZINE MALEATE 10 MG PO TABS
ORAL_TABLET | ORAL | Status: AC
  Filled 2018-04-14: qty 1

## 2018-04-14 MED ORDER — SODIUM CHLORIDE 0.9 % IV SOLN
15.7000 mg/kg | Freq: Once | INTRAVENOUS | Status: AC
  Administered 2018-04-14: 1600 mg via INTRAVENOUS
  Filled 2018-04-14: qty 80

## 2018-04-14 MED ORDER — METHYLPREDNISOLONE SODIUM SUCC 125 MG IJ SOLR
125.0000 mg | Freq: Once | INTRAMUSCULAR | Status: AC
  Administered 2018-04-14: 125 mg via INTRAVENOUS

## 2018-04-14 MED ORDER — SODIUM CHLORIDE 0.9 % IV SOLN
Freq: Once | INTRAVENOUS | Status: AC
  Administered 2018-04-14: 09:00:00 via INTRAVENOUS
  Filled 2018-04-14: qty 250

## 2018-04-14 MED ORDER — SODIUM CHLORIDE 0.9% FLUSH
10.0000 mL | INTRAVENOUS | Status: DC | PRN
  Administered 2018-04-14: 10 mL
  Filled 2018-04-14: qty 10

## 2018-04-14 MED ORDER — DIPHENHYDRAMINE HCL 25 MG PO CAPS
ORAL_CAPSULE | ORAL | Status: AC
  Filled 2018-04-14: qty 2

## 2018-04-14 NOTE — Progress Notes (Signed)
Hematology and Oncology Follow Up Visit  Gene Hill [id number] [birth year] 78 y.o. 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  Unfortunately, his wife cannot come back with him because of the restrictions secondary to the coronavirus.  His back is still causing a lot of problems for him.  This is not from myeloma.  He gets injections which have helped on occasion.  His right shoulder surgery was canceled.  I should say, that the right shoulder surgery was rescheduled again because of the coronavirus.  His garden is doing a little better.  Is not rained as much so he is able to do some planting.  His myeloma is still holding steady.  [name] was 0.4 g/dL.  His IgA level was 785 mg/dL.  His kappa light chain was 1.8 mg/dL.  He is having no problems with bowels or bladder.  He has had no fever.  There has been no cough.  He has had no rashes.  Overall, his performance status is ECOG 1   Medications:  Allergies as of 04/14/2018   No Known Allergies     Medication List       Accurate as of April 14, 2018  8:47 AM. Always use your most recent med list.        acetaminophen 325 MG tablet Commonly known as:  TYLENOL Take 650 mg by mouth every 6 (six) hours as needed for moderate pain.   aspirin 325 MG tablet Take 325 mg by mouth daily.   atorvastatin 10 MG tablet Commonly known as:  LIPITOR Take 1 tablet (10 mg total) by mouth at bedtime.   calcium carbonate 1500 (600 Ca) MG Tabs tablet Commonly known as:  OSCAL Take 1,500 mg by mouth 2 (two) times daily with a meal.   DARZALEX IV Inject into the vein every 6 (six) weeks.   dexamethasone 2 MG tablet  Commonly known as:  DECADRON TAKE 2 TABLETS BY MOUTH DAY AFTER CHEMO THEN 1 TABLET EVERY DAY FOR 4 DAYS   Fish Oil 1200 MG Caps Take 1,200 mg by mouth daily.   ibuprofen 200 MG tablet Commonly known as:  ADVIL,MOTRIN Take 400 mg by mouth every 6 (six) hours as needed for moderate pain.   lidocaine 5 % ointment Commonly known as:  XYLOCAINE Apply 1 application topically 2 (two) times daily as needed. Mix w/ OTC Hydrocortisone 1 %   methocarbamol 500 MG tablet Commonly known as:  ROBAXIN Take 1 tablet (500 mg total) by mouth 2 (two) times daily as needed for muscle spasms.   metoprolol tartrate 100 MG tablet Commonly known as:  LOPRESSOR Take 0.5 tablets (50 mg total) by mouth 2 (two) times daily.   multivitamin tablet Take 1 tablet by mouth daily.   ondansetron 4 MG tablet Commonly known as:  ZOFRAN TAKE 1 TABLET BY MOUTH 1 HOUR BEFORE NINLARO, THEN EVERY 8 HOURS AS NEEDED FOR NAUSEA   oxyCODONE 5 MG immediate release tablet Commonly known as:  Oxy IR/ROXICODONE Take 1 tablet (5 mg total) by mouth every 6 (six) hours as needed for severe pain.  pantoprazole 40 MG tablet Commonly known as:  PROTONIX Take 1 tablet (40 mg total) by mouth daily.   PROBIOTIC PO Take 1 tablet by mouth daily.   VITAMIN B-12 PO Take 5,000 mcg by mouth daily.       Allergies: No Known Allergies  Past Medical History, Surgical history, Social history, and Family History were reviewed and updated.  Review of Systems: Review of Systems  Constitutional: Negative.   HENT: Negative.   Eyes: Negative.   Respiratory: Negative.   Cardiovascular: Negative.   Gastrointestinal: Negative.   Genitourinary: Negative.   Musculoskeletal: Positive for back pain and joint pain.  Skin: Negative.   Neurological: Negative.   Endo/Heme/Allergies: Negative.   Psychiatric/Behavioral: Negative.       Physical Exam:  weight is 217 lb (98.4 kg). His oral temperature is 98.4 F (36.9 C). His blood  pressure is 132/80 and his pulse is 78. His respiration is 16 and oxygen saturation is 97%.   Wt Readings from Last 3 Encounters:  04/14/18 217 lb (98.4 kg)  04/07/18 216 lb 3.2 oz (98.1 kg)  03/27/18 216 lb 2 oz (98 kg)  Is  Physical Exam Vitals signs reviewed.  HENT:     Head: Normocephalic and atraumatic.  Eyes:     Pupils: Pupils are equal, round, and reactive to light.  Neck:     Musculoskeletal: Normal range of motion.  Cardiovascular:     Rate and Rhythm: Normal rate and regular rhythm.     Heart sounds: Normal heart sounds.  Pulmonary:     Effort: Pulmonary effort is normal.     Breath sounds: Normal breath sounds.  Abdominal:     General: Bowel sounds are normal.     Palpations: Abdomen is soft.  Musculoskeletal: Normal range of motion.        General: No tenderness or deformity.  Lymphadenopathy:     Cervical: No cervical adenopathy.  Skin:    General: Skin is warm and dry.     Findings: No erythema or rash.  Neurological:     Mental Status: He is alert and oriented to person, place, and time.  Psychiatric:        Behavior: Behavior normal.        Thought Content: Thought content normal.        Judgment: Judgment normal.      Lab Results  Component Value Date   WBC 4.2 04/14/2018   HGB 12.1 (L) 04/14/2018   HCT 37.0 (L) 04/14/2018   MCV 103.9 (H) 04/14/2018   PLT 182 04/14/2018   Lab Results  Component Value Date   FERRITIN 351 (H) 06/26/2011   IRON 75 06/26/2011   TIBC 353 06/26/2011   UIBC 278 06/26/2011   IRONPCTSAT 21 06/26/2011   Lab Results  Component Value Date   RBC 3.56 (L) 04/14/2018   Lab Results  Component Value Date   KPAFRELGTCHN 17.6 03/03/2018   LAMBDASER 1.6 (L) 03/03/2018   KAPLAMBRATIO 11.00 (H) 03/03/2018   Lab Results  Component Value Date   IGGSERUM 197 (L) 03/03/2018   IGGSERUM 203 (L) 03/03/2018   IGA 785 (H) 03/03/2018   IGA 793 (H) 03/03/2018   IGMSERUM 6 (L) 03/03/2018   IGMSERUM <5 (L) 03/03/2018   Lab  Results  Component Value Date   TOTALPROTELP 6.5 03/03/2018   ALBUMINELP 3.7 03/03/2018   A1GS 0.2 03/03/2018   A2GS 0.9 03/03/2018   BETS 0.8 03/03/2018   BETA2SER 2.0 (H) 12/08/2014  GAMS 0.8 03/03/2018   MSPIKE 0.4 (H) 03/03/2018   SPEI Comment 09/02/2017     Chemistry      Component Value Date/Time   NA 141 04/14/2018 0816   NA 141 01/21/2017 0741   NA 139 06/14/2016 1246   K 3.9 04/14/2018 0816   K 4.0 01/21/2017 0741   K 3.8 06/14/2016 1246   CL 106 04/14/2018 0816   CL 103 01/21/2017 0741   CO2 26 04/14/2018 0816   CO2 30 01/21/2017 0741   CO2 24 06/14/2016 1246   BUN 15 04/14/2018 0816   BUN 20 01/21/2017 0741   BUN 15.0 06/14/2016 1246   CREATININE 0.93 04/14/2018 0816   CREATININE 1.0 01/21/2017 0741   CREATININE 0.9 06/14/2016 1246      Component Value Date/Time   CALCIUM 8.9 04/14/2018 0816   CALCIUM 9.0 01/21/2017 0741   CALCIUM 9.6 06/14/2016 1246   ALKPHOS 29 (L) 04/14/2018 0816   ALKPHOS 37 01/21/2017 0741   ALKPHOS 54 06/14/2016 1246   AST 16 04/14/2018 0816   AST 25 06/14/2016 1246   ALT 19 04/14/2018 0816   ALT 29 01/21/2017 0741   ALT 21 06/14/2016 1246   BILITOT 0.3 04/14/2018 0816   BILITOT 0.37 06/14/2016 1246      Impression and Plan: Gene Hill is a very pleasant 78 yo caucasian gentleman with history of IgA kappa myeloma. He had high risk cytogenetics with a 17p- abnormality.   Overall, I really think that he is done well.  His myeloma studies have looked stable.  Hopefully, we will find that his myeloma levels are stable.  I think the every 6-week treatment is doing well for him.  We will get him back in 6 more weeks.  [name], MD 3/23/20208:47 AM

## 2018-04-14 NOTE — Patient Instructions (Signed)
Daratumumab injection What is this medicine? DARATUMUMAB (dar a toom ue mab) is a monoclonal antibody. It is used to treat multiple myeloma. This medicine may be used for other purposes; ask your health care provider or pharmacist if you have questions. COMMON BRAND NAME(S): DARZALEX What should I tell my health care provider before I take this medicine? They need to know if you have any of these conditions: -infection (especially a virus infection such as chickenpox, herpes, or hepatitis B virus) -lung or breathing disease -an unusual or allergic reaction to daratumumab, other medicines, foods, dyes, or preservatives -pregnant or trying to get pregnant -breast-feeding How should I use this medicine? This medicine is for infusion into a vein. It is given by a health care professional in a hospital or clinic setting. Talk to your pediatrician regarding the use of this medicine in children. Special care may be needed. Overdosage: If you think you have taken too much of this medicine contact a poison control center or emergency room at once. NOTE: This medicine is only for you. Do not share this medicine with others. What if I miss a dose? Keep appointments for follow-up doses as directed. It is important not to miss your dose. Call your doctor or health care professional if you are unable to keep an appointment. What may interact with this medicine? Interactions have not been studied. Give your health care provider a list of all the medicines, herbs, non-prescription drugs, or dietary supplements you use. Also tell them if you smoke, drink alcohol, or use illegal drugs. Some items may interact with your medicine. This list may not describe all possible interactions. Give your health care provider a list of all the medicines, herbs, non-prescription drugs, or dietary supplements you use. Also tell them if you smoke, drink alcohol, or use illegal drugs. Some items may interact with your  medicine. What should I watch for while using this medicine? This drug may make you feel generally unwell. Report any side effects. Continue your course of treatment even though you feel ill unless your doctor tells you to stop. This medicine can cause serious allergic reactions. To reduce your risk you may need to take medicine before treatment with this medicine. Take your medicine as directed. This medicine can affect the results of blood tests to match your blood type. These changes can last for up to 6 months after the final dose. Your healthcare provider will do blood tests to match your blood type before you start treatment. Tell all of your healthcare providers that you are being treated with this medicine before receiving a blood transfusion. This medicine can affect the results of some tests used to determine treatment response; extra tests may be needed to evaluate response. Do not become pregnant while taking this medicine or for 3 months after stopping it. Women should inform their doctor if they wish to become pregnant or think they might be pregnant. There is a potential for serious side effects to an unborn child. Talk to your health care professional or pharmacist for more information. What side effects may I notice from receiving this medicine? Side effects that you should report to your doctor or health care professional as soon as possible: -allergic reactions like skin rash, itching or hives, swelling of the face, lips, or tongue -breathing problems -chills -cough -dizziness -feeling faint or lightheaded -headache -low blood counts - this medicine may decrease the number of white blood cells, red blood cells and platelets. You may be at increased   risk for infections and bleeding. -nausea, vomiting -shortness of breath -signs of decreased platelets or bleeding - bruising, pinpoint red spots on the skin, black, tarry stools, blood in the urine -signs of decreased red blood  cells - unusually weak or tired, feeling faint or lightheaded, falls -signs of infection - fever or chills, cough, sore throat, pain or difficulty passing urine -signs and symptoms of liver injury like dark yellow or brown urine; general ill feeling or flu-like symptoms; light-colored stools; loss of appetite; right upper belly pain; unusually weak or tired; yellowing of the eyes or skin Side effects that usually do not require medical attention (report to your doctor or health care professional if they continue or are bothersome): -back pain -constipation -loss of appetite -diarrhea -joint pain -muscle cramps -pain, tingling, numbness in the hands or feet -swelling of the ankles, feet, hands -tiredness -trouble sleeping This list may not describe all possible side effects. Call your doctor for medical advice about side effects. You may report side effects to FDA at 1-800-FDA-1088. Where should I keep my medicine? Keep out of the reach of children. This drug is given in a hospital or clinic and will not be stored at home. NOTE: This sheet is a summary. It may not cover all possible information. If you have questions about this medicine, talk to your doctor, pharmacist, or health care provider.  2019 Elsevier/Gold Standard (2017-08-09 15:52:44)

## 2018-04-14 NOTE — Patient Instructions (Signed)

## 2018-04-15 LAB — IGG, IGA, IGM
IgA: 740 mg/dL — ABNORMAL HIGH (ref 61–437)
IgG (Immunoglobin G), Serum: 168 mg/dL — ABNORMAL LOW (ref 700–1600)
IgM (Immunoglobulin M), Srm: <5 mg/dL — ABNORMAL LOW (ref 15–143)

## 2018-04-15 LAB — KAPPA/LAMBDA LIGHT CHAINS
Kappa free light chain: 21.6 mg/L — ABNORMAL HIGH (ref 3.3–19.4)
Kappa, lambda light chain ratio: 13.5 — ABNORMAL HIGH (ref 0.26–1.65)
Lambda free light chains: 1.6 mg/L — ABNORMAL LOW (ref 5.7–26.3)

## 2018-04-16 ENCOUNTER — Encounter (HOSPITAL_COMMUNITY): Admission: RE | Payer: Self-pay | Source: Ambulatory Visit

## 2018-04-16 ENCOUNTER — Encounter: Payer: Self-pay | Admitting: Hematology & Oncology

## 2018-04-16 ENCOUNTER — Inpatient Hospital Stay (HOSPITAL_COMMUNITY): Admission: RE | Admit: 2018-04-16 | Payer: PPO | Source: Ambulatory Visit | Admitting: Orthopaedic Surgery

## 2018-04-16 LAB — IMMUNOFIXATION REFLEX, SERUM
IgA: 740 mg/dL — ABNORMAL HIGH (ref 61–437)
IgG (Immunoglobin G), Serum: 192 mg/dL — ABNORMAL LOW (ref 700–1600)
IgM (Immunoglobulin M), Srm: <5 mg/dL — ABNORMAL LOW (ref 15–143)

## 2018-04-16 LAB — PROTEIN ELECTROPHORESIS, SERUM, WITH REFLEX
A/G Ratio: 1.3 (ref 0.7–1.7)
Albumin ELP: 3.5 g/dL (ref 2.9–4.4)
Alpha-1-Globulin: 0.2 g/dL (ref 0.0–0.4)
Alpha-2-Globulin: 0.9 g/dL (ref 0.4–1.0)
Beta Globulin: 0.8 g/dL (ref 0.7–1.3)
Gamma Globulin: 0.8 g/dL (ref 0.4–1.8)
Globulin, Total: 2.6 g/dL (ref 2.2–3.9)
M-Spike, %: 0.4 g/dL — ABNORMAL HIGH
SPEP Interpretation: 0
Total Protein ELP: 6.1 g/dL (ref 6.0–8.5)

## 2018-04-16 SURGERY — ARTHROPLASTY, SHOULDER, TOTAL, REVERSE
Anesthesia: Choice | Laterality: Right

## 2018-04-16 NOTE — Progress Notes (Signed)
Patient's spouse(florence) left a vociemail regarding copay assistance and submitting claims for office visit copays.  Called back and advised I would forward information to Baxter Flattery at [facility] whom would submit this for her being that is where he receives treatment.  Emailed Event organiser with information and follow up phone number.

## 2018-04-17 ENCOUNTER — Telehealth: Payer: Self-pay | Admitting: *Deleted

## 2018-04-17 NOTE — Telephone Encounter (Addendum)
Patient is aware of results  ----- Message from [name], MD sent at 04/16/2018  5:21 PM EDT ----- Please call and tell him that the myeloma is still stable at 0.4 g/dL.  This is wonderful news.  Thanks.  Laurey Arrow

## 2018-05-02 ENCOUNTER — Ambulatory Visit: Payer: Self-pay | Admitting: Internal Medicine

## 2018-05-02 DIAGNOSIS — C9000 Multiple myeloma not having achieved remission: Secondary | ICD-10-CM | POA: Diagnosis not present

## 2018-05-02 DIAGNOSIS — L723 Sebaceous cyst: Secondary | ICD-10-CM | POA: Diagnosis not present

## 2018-05-02 NOTE — Telephone Encounter (Signed)
Wife called in concerned about husband having a cyst on his lower spine that is starting to look infected.   See notes below.  She is going to take him to the [facility] on [address]   I gave her the phone number and let her know they were open today (Good Friday, Dr. Ethel Rana office closed) per the web site.  She thanked me for my help.  Reason for Disposition . [1] Swelling is painful to touch AND [2] no fever  Answer Assessment - Initial Assessment Questions 1. APPEARANCE of SWELLING: "What does it look like?" (e.g., lymph node, insect bite, mole)     He has cancer and I'm his caregiver. He has a cyst on the lower spine.   It became very inflamed.  He mentioned it last evening.   I put a drsg on it yesterday.   This morning there is infection showing.   My main concern he has multiple myeloma.   I concerned about infection in his blood stream.   He needs an antibiotic. 2. SIZE: "How large is the swelling?" (inches, cm or compare to coins)     Larger than a nickel.   It sticks out. 3. LOCATION: "Where is the swelling located?"     Lower spine. 4. ONSET: "When did the swelling start?"     He had chemo treatment March 20.   Dr. Jorje Guild examined it due to it being a bump then.    5. PAIN: "Is it painful?" If so, ask: "How much?"     Wife seeing the infection.  It's not open yet but it has pus in there.  No fever.  Very tender to him.   When he sat back in a chair it really hurt lat night. 6. ITCH: "Does it itch?" If so, ask: "How much?"     When he bends it pulls and causes pain.   No itching. 7. CAUSE: "What do you think caused the swelling?"     He had one 4 years ago.   It took several days to get it drained.  They put in drains.   8. OTHER SYMPTOMS: "Do you have any other symptoms?" (e.g., fever)     No fever.  Protocols used: SKIN LUMP OR LOCALIZED SWELLING-A-AH

## 2018-05-05 DIAGNOSIS — L723 Sebaceous cyst: Secondary | ICD-10-CM | POA: Diagnosis not present

## 2018-05-12 ENCOUNTER — Other Ambulatory Visit (INDEPENDENT_AMBULATORY_CARE_PROVIDER_SITE_OTHER): Payer: PPO

## 2018-05-12 ENCOUNTER — Other Ambulatory Visit: Payer: Self-pay

## 2018-05-12 DIAGNOSIS — E785 Hyperlipidemia, unspecified: Secondary | ICD-10-CM

## 2018-05-12 LAB — LIPID PANEL
Cholesterol: 171 mg/dL (ref 0–200)
HDL: 46.5 mg/dL (ref >39.00)
LDL Cholesterol: 90 mg/dL (ref 0–99)
NonHDL: 124.72
Total CHOL/HDL Ratio: 4
Triglycerides: 172 mg/dL — ABNORMAL HIGH (ref 0.0–149.0)
VLDL: 34.4 mg/dL (ref 0.0–40.0)

## 2018-05-12 LAB — ALT: ALT: 22 U/L (ref 0–53)

## 2018-05-12 LAB — AST: AST: 17 U/L (ref 0–37)

## 2018-05-16 MED ORDER — ATORVASTATIN CALCIUM 10 MG PO TABS: 10.0000 mg | ORAL_TABLET | Freq: Every day | ORAL | 1 refills | Status: DC

## 2018-05-16 NOTE — Addendum Note (Signed)
Addended byDamita Dunnings D on: 05/16/2018 09:51 AM   Modules accepted: Orders

## 2018-05-26 ENCOUNTER — Inpatient Hospital Stay: Payer: PPO

## 2018-05-26 ENCOUNTER — Inpatient Hospital Stay: Payer: PPO | Attending: Hematology & Oncology | Admitting: Hematology & Oncology

## 2018-05-26 ENCOUNTER — Encounter: Payer: Self-pay | Admitting: Hematology & Oncology

## 2018-05-26 ENCOUNTER — Other Ambulatory Visit: Payer: Self-pay

## 2018-05-26 VITALS — BP 153/77 | HR 80 | Temp 98.0°F | Resp 19 | Wt 214.0 lb

## 2018-05-26 VITALS — BP 157/83 | HR 81 | Temp 97.9°F | Resp 14

## 2018-05-26 DIAGNOSIS — C9002 Multiple myeloma in relapse: Secondary | ICD-10-CM

## 2018-05-26 DIAGNOSIS — C9000 Multiple myeloma not having achieved remission: Secondary | ICD-10-CM

## 2018-05-26 DIAGNOSIS — Z5111 Encounter for antineoplastic chemotherapy: Secondary | ICD-10-CM | POA: Diagnosis not present

## 2018-05-26 DIAGNOSIS — Z79899 Other long term (current) drug therapy: Secondary | ICD-10-CM | POA: Diagnosis not present

## 2018-05-26 DIAGNOSIS — M546 Pain in thoracic spine: Secondary | ICD-10-CM

## 2018-05-26 DIAGNOSIS — Z9481 Bone marrow transplant status: Secondary | ICD-10-CM

## 2018-05-26 LAB — CBC WITH DIFFERENTIAL (CANCER CENTER ONLY)
Abs Immature Granulocytes: 0.01 10*3/uL (ref 0.00–0.07)
Basophils Absolute: 0 10*3/uL (ref 0.0–0.1)
Basophils Relative: 1 %
Eosinophils Absolute: 0.1 10*3/uL (ref 0.0–0.5)
Eosinophils Relative: 1 %
HCT: 38.1 % — ABNORMAL LOW (ref 39.0–52.0)
Hemoglobin: 12.4 g/dL — ABNORMAL LOW (ref 13.0–17.0)
Immature Granulocytes: 0 %
Lymphocytes Relative: 41 %
Lymphs Abs: 2 10*3/uL (ref 0.7–4.0)
MCH: 33.7 pg (ref 26.0–34.0)
MCHC: 32.5 g/dL (ref 30.0–36.0)
MCV: 103.5 fL — ABNORMAL HIGH (ref 80.0–100.0)
Monocytes Absolute: 0.7 10*3/uL (ref 0.1–1.0)
Monocytes Relative: 15 %
Neutro Abs: 2 10*3/uL (ref 1.7–7.7)
Neutrophils Relative %: 42 %
Platelet Count: 182 10*3/uL (ref 150–400)
RBC: 3.68 MIL/uL — ABNORMAL LOW (ref 4.22–5.81)
RDW: 13.5 % (ref 11.5–15.5)
WBC Count: 4.8 10*3/uL (ref 4.0–10.5)
nRBC: 0 % (ref 0.0–0.2)

## 2018-05-26 LAB — CMP (CANCER CENTER ONLY)
ALT: 22 U/L (ref 0–44)
AST: 19 U/L (ref 15–41)
Albumin: 4.2 g/dL (ref 3.5–5.0)
Alkaline Phosphatase: 31 U/L — ABNORMAL LOW (ref 38–126)
Anion gap: 10 (ref 5–15)
BUN: 19 mg/dL (ref 8–23)
CO2: 27 mmol/L (ref 22–32)
Calcium: 10 mg/dL (ref 8.9–10.3)
Chloride: 104 mmol/L (ref 98–111)
Creatinine: 0.99 mg/dL (ref 0.61–1.24)
GFR, Est AFR Am: >60 mL/min (ref >60)
GFR, Estimated: >60 mL/min (ref >60)
Glucose, Bld: 115 mg/dL — ABNORMAL HIGH (ref 70–99)
Potassium: 4.1 mmol/L (ref 3.5–5.1)
Sodium: 141 mmol/L (ref 135–145)
Total Bilirubin: 0.4 mg/dL (ref 0.3–1.2)
Total Protein: 6.8 g/dL (ref 6.5–8.1)

## 2018-05-26 MED ORDER — PROCHLORPERAZINE MALEATE 10 MG PO TABS
10.0000 mg | ORAL_TABLET | Freq: Once | ORAL | Status: AC
  Administered 2018-05-26: 10 mg via ORAL

## 2018-05-26 MED ORDER — SODIUM CHLORIDE 0.9 % IV SOLN
Freq: Once | INTRAVENOUS | Status: AC
  Administered 2018-05-26: 10:00:00 via INTRAVENOUS
  Filled 2018-05-26: qty 250

## 2018-05-26 MED ORDER — DIPHENHYDRAMINE HCL 25 MG PO CAPS
ORAL_CAPSULE | ORAL | Status: AC
  Filled 2018-05-26: qty 2

## 2018-05-26 MED ORDER — SODIUM CHLORIDE 0.9 % IV SOLN
15.7000 mg/kg | Freq: Once | INTRAVENOUS | Status: AC
  Administered 2018-05-26: 12:00:00 1600 mg via INTRAVENOUS
  Filled 2018-05-26: qty 80

## 2018-05-26 MED ORDER — ACETAMINOPHEN 325 MG PO TABS
ORAL_TABLET | ORAL | Status: AC
  Filled 2018-05-26: qty 2

## 2018-05-26 MED ORDER — DIPHENHYDRAMINE HCL 25 MG PO CAPS
50.0000 mg | ORAL_CAPSULE | Freq: Once | ORAL | Status: AC
  Administered 2018-05-26: 11:00:00 50 mg via ORAL

## 2018-05-26 MED ORDER — SODIUM CHLORIDE 0.9% FLUSH
10.0000 mL | INTRAVENOUS | Status: DC | PRN
  Administered 2018-05-26: 13:00:00 10 mL
  Filled 2018-05-26: qty 10

## 2018-05-26 MED ORDER — METHYLPREDNISOLONE SODIUM SUCC 125 MG IJ SOLR
125.0000 mg | Freq: Once | INTRAMUSCULAR | Status: AC
  Administered 2018-05-26: 11:00:00 125 mg via INTRAVENOUS

## 2018-05-26 MED ORDER — DENOSUMAB 120 MG/1.7ML ~~LOC~~ SOLN
SUBCUTANEOUS | Status: AC
  Filled 2018-05-26: qty 1.7

## 2018-05-26 MED ORDER — HEPARIN SOD (PORK) LOCK FLUSH 100 UNIT/ML IV SOLN
500.0000 [IU] | Freq: Once | INTRAVENOUS | Status: AC | PRN
  Administered 2018-05-26: 13:00:00 500 [IU]
  Filled 2018-05-26: qty 5

## 2018-05-26 MED ORDER — OXYCODONE HCL 5 MG PO TABS: 5.0000 mg | ORAL_TABLET | Freq: Four times a day (QID) | ORAL | 0 refills | Status: DC | PRN

## 2018-05-26 MED ORDER — METHYLPREDNISOLONE SODIUM SUCC 125 MG IJ SOLR
INTRAMUSCULAR | Status: AC
  Filled 2018-05-26: qty 2

## 2018-05-26 MED ORDER — DENOSUMAB 120 MG/1.7ML ~~LOC~~ SOLN
120.0000 mg | Freq: Once | SUBCUTANEOUS | Status: AC
  Administered 2018-05-26: 14:00:00 120 mg via SUBCUTANEOUS

## 2018-05-26 MED ORDER — ACETAMINOPHEN 325 MG PO TABS
650.0000 mg | ORAL_TABLET | Freq: Once | ORAL | Status: AC
  Administered 2018-05-26: 11:00:00 650 mg via ORAL

## 2018-05-26 NOTE — Addendum Note (Signed)
Addended by: Burney Gauze R on: 05/26/2018 10:55 AM   Modules accepted: Orders

## 2018-05-26 NOTE — Progress Notes (Signed)
Ok to treat per Dr. Marin Olp.

## 2018-05-26 NOTE — Progress Notes (Signed)
Hematology and Oncology Follow Up Visit  Gene Hill [id number] 05-31-1940 78 y.o. 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  He is doing okay.  His garden is starting to come in.  He brought in some lettuce and some green onions for [place] today.  He has some skin lesions taken off his back.  These were not malignant.  He still not sure when his right shoulder surgery is going to be done.  It was rescheduled because of the coronavirus.  Thankfully, his myeloma has been holding steady.  His last M spike was 0.4 g/dL.  His IgA level was 740 mg/dL.  His kappa light chain was 2.2 mg/dL.  He is having quite a bit of back issues.  This is arthritis more so than myeloma.  His right shoulder is also causing quite a bit of problems for him.  His appetite is good.  He has had no change in bowel or bladder habits.  Has had no nausea or vomiting.  There is been no bleeding.  Overall, his performance status is ECOG 1.     Medications:  Allergies as of 05/26/2018   No Known Allergies     Medication List       Accurate as of May 26, 2018  9:50 AM. Always use your most recent med list.        acetaminophen 325 MG tablet Commonly known as:  TYLENOL Take 650 mg by mouth every 6 (six) hours as needed for moderate pain.   aspirin 325 MG tablet Take 325 mg by mouth daily.   atorvastatin 10 MG tablet Commonly known as:  LIPITOR Take 1 tablet (10 mg total) by mouth at bedtime.   calcium carbonate 1500 (600 Ca) MG Tabs tablet Commonly known as:  OSCAL Take 1,500 mg by mouth 2 (two) times daily with a meal.   DARZALEX IV Inject into the vein every 6 (six) weeks.   dexamethasone 2 MG  tablet Commonly known as:  DECADRON TAKE 2 TABLETS BY MOUTH DAY AFTER CHEMO THEN 1 TABLET EVERY DAY FOR 4 DAYS   Fish Oil 1200 MG Caps Take 1,200 mg by mouth daily.   fluticasone 50 MCG/ACT nasal spray Commonly known as:  FLONASE fluticasone propionate 50 mcg/actuation nasal spray,suspension   ibuprofen 200 MG tablet Commonly known as:  ADVIL Take 400 mg by mouth every 6 (six) hours as needed for moderate pain.   lidocaine 5 % ointment Commonly known as:  XYLOCAINE Apply 1 application topically 2 (two) times daily as needed. Mix w/ OTC Hydrocortisone 1 %   methocarbamol 500 MG tablet Commonly known as:  ROBAXIN Take 1 tablet (500 mg total) by mouth 2 (two) times daily as needed for muscle spasms.   metoprolol tartrate 100 MG tablet Commonly known as:  LOPRESSOR Take 0.5 tablets (50 mg total) by mouth 2 (two) times daily.   multivitamin tablet Take 1 tablet by mouth daily.   ondansetron 4 MG tablet Commonly known as:  ZOFRAN TAKE 1 TABLET BY MOUTH 1 HOUR BEFORE NINLARO, THEN EVERY 8 HOURS AS NEEDED FOR NAUSEA  oxyCODONE 5 MG immediate release tablet Commonly known as:  Oxy IR/ROXICODONE Take 1 tablet (5 mg total) by mouth every 6 (six) hours as needed for severe pain.   pantoprazole 40 MG tablet Commonly known as:  PROTONIX Take 1 tablet (40 mg total) by mouth daily.   PROBIOTIC PO Take 1 tablet by mouth daily.   VITAMIN B-12 PO Take 5,000 mcg by mouth daily.       Allergies: No Known Allergies  Past Medical History, Surgical history, Social history, and Family History were reviewed and updated.  Review of Systems: Review of Systems  Constitutional: Negative.   HENT: Negative.   Eyes: Negative.   Respiratory: Negative.   Cardiovascular: Negative.   Gastrointestinal: Negative.   Genitourinary: Negative.   Musculoskeletal: Positive for back pain and joint pain.  Skin: Negative.   Neurological: Negative.   Endo/Heme/Allergies: Negative.    Psychiatric/Behavioral: Negative.       Physical Exam:  weight is 214 lb (97.1 kg). His oral temperature is 98 F (36.7 C). His blood pressure is 153/77 (abnormal) and his pulse is 80. His respiration is 19 and oxygen saturation is 96%.   Wt Readings from Last 3 Encounters:  05/26/18 214 lb (97.1 kg)  04/14/18 217 lb (98.4 kg)  04/07/18 216 lb 3.2 oz (98.1 kg)  Is  Physical Exam Vitals signs reviewed.  HENT:     Head: Normocephalic and atraumatic.  Eyes:     Pupils: Pupils are equal, round, and reactive to light.  Neck:     Musculoskeletal: Normal range of motion.  Cardiovascular:     Rate and Rhythm: Normal rate and regular rhythm.     Heart sounds: Normal heart sounds.  Pulmonary:     Effort: Pulmonary effort is normal.     Breath sounds: Normal breath sounds.  Abdominal:     General: Bowel sounds are normal.     Palpations: Abdomen is soft.  Musculoskeletal: Normal range of motion.        General: No tenderness or deformity.  Lymphadenopathy:     Cervical: No cervical adenopathy.  Skin:    General: Skin is warm and dry.     Findings: No erythema or rash.  Neurological:     Mental Status: He is alert and oriented to person, place, and time.  Psychiatric:        Behavior: Behavior normal.        Thought Content: Thought content normal.        Judgment: Judgment normal.      Lab Results  Component Value Date   WBC 4.8 05/26/2018   HGB 12.4 (L) 05/26/2018   HCT 38.1 (L) 05/26/2018   MCV 103.5 (H) 05/26/2018   PLT 182 05/26/2018   Lab Results  Component Value Date   FERRITIN 351 (H) 06/26/2011   IRON 75 06/26/2011   TIBC 353 06/26/2011   UIBC 278 06/26/2011   IRONPCTSAT 21 06/26/2011   Lab Results  Component Value Date   RBC 3.68 (L) 05/26/2018   Lab Results  Component Value Date   KPAFRELGTCHN 21.6 (H) 04/14/2018   LAMBDASER 1.6 (L) 04/14/2018   KAPLAMBRATIO 13.50 (H) 04/14/2018   Lab Results  Component Value Date   IGGSERUM 192 (L)  04/14/2018   IGA 740 (H) 04/14/2018   IGMSERUM <5 (L) 04/14/2018   Lab Results  Component Value Date   TOTALPROTELP 6.1 04/14/2018   ALBUMINELP 3.5 04/14/2018   A1GS 0.2 04/14/2018   A2GS 0.9 04/14/2018  BETS 0.8 04/14/2018   BETA2SER 2.0 (H) 12/08/2014   GAMS 0.8 04/14/2018   MSPIKE 0.4 (H) 04/14/2018   SPEI Comment 09/02/2017     Chemistry      Component Value Date/Time   NA 141 05/26/2018 0830   NA 141 01/21/2017 0741   NA 139 06/14/2016 1246   K 4.1 05/26/2018 0830   K 4.0 01/21/2017 0741   K 3.8 06/14/2016 1246   CL 104 05/26/2018 0830   CL 103 01/21/2017 0741   CO2 27 05/26/2018 0830   CO2 30 01/21/2017 0741   CO2 24 06/14/2016 1246   BUN 19 05/26/2018 0830   BUN 20 01/21/2017 0741   BUN 15.0 06/14/2016 1246   CREATININE 0.99 05/26/2018 0830   CREATININE 1.0 01/21/2017 0741   CREATININE 0.9 06/14/2016 1246      Component Value Date/Time   CALCIUM 10.0 05/26/2018 0830   CALCIUM 9.0 01/21/2017 0741   CALCIUM 9.6 06/14/2016 1246   ALKPHOS 31 (L) 05/26/2018 0830   ALKPHOS 37 01/21/2017 0741   ALKPHOS 54 06/14/2016 1246   AST 19 05/26/2018 0830   AST 25 06/14/2016 1246   ALT 22 05/26/2018 0830   ALT 29 01/21/2017 0741   ALT 21 06/14/2016 1246   BILITOT 0.4 05/26/2018 0830   BILITOT 0.37 06/14/2016 1246      Impression and Plan: Mr. Luhn is a very pleasant 78 yo caucasian gentleman with history of IgA kappa myeloma. He had high risk cytogenetics with a 17p- abnormality.   Overall, I really think that he is done well.  His myeloma studies have looked stable.  Hopefully, we will find that his myeloma levels are stable.  I think the every 6-week treatment is doing well for him.  We will get him back in 6 more weeks.  [name], MD 5/4/20209:50 AM

## 2018-05-26 NOTE — Patient Instructions (Signed)
Implanted Port Insertion, Care After  This sheet gives you information about how to care for yourself after your procedure. Your health care provider may also give you more specific instructions. If you have problems or questions, contact your health care provider.  What can I expect after the procedure?  After the procedure, it is common to have:  · Discomfort at the port insertion site.  · Bruising on the skin over the port. This should improve over 3-4 days.  Follow these instructions at home:  Port care  · After your port is placed, you will get a manufacturer's information card. The card has information about your port. Keep this card with you at all times.  · Take care of the port as told by your health care provider. Ask your health care provider if you or a family member can get training for taking care of the port at home. A home health care nurse may also take care of the port.  · Make sure to remember what type of port you have.  Incision care         · Follow instructions from your health care provider about how to take care of your port insertion site. Make sure you:  ? Wash your hands with soap and water before and after you change your bandage (dressing). If soap and water are not available, use hand sanitizer.  ? Change your dressing as told by your health care provider.  ? Leave stitches (sutures), skin glue, or adhesive strips in place. These skin closures may need to stay in place for 2 weeks or longer. If adhesive strip edges start to loosen and curl up, you may trim the loose edges. Do not remove adhesive strips completely unless your health care provider tells you to do that.  · Check your port insertion site every day for signs of infection. Check for:  ? Redness, swelling, or pain.  ? Fluid or blood.  ? Warmth.  ? Pus or a bad smell.  Activity  · Return to your normal activities as told by your health care provider. Ask your health care provider what activities are safe for you.  · Do not  lift anything that is heavier than 10 lb (4.5 kg), or the limit that you are told, until your health care provider says that it is safe.  General instructions  · Take over-the-counter and prescription medicines only as told by your health care provider.  · Do not take baths, swim, or use a hot tub until your health care provider approves. Ask your health care provider if you may take showers. You may only be allowed to take sponge baths.  · Do not drive for 24 hours if you were given a sedative during your procedure.  · Wear a medical alert bracelet in case of an emergency. This will tell any health care providers that you have a port.  · Keep all follow-up visits as told by your health care provider. This is important.  Contact a health care provider if:  · You cannot flush your port with saline as directed, or you cannot draw blood from the port.  · You have a fever or chills.  · You have redness, swelling, or pain around your port insertion site.  · You have fluid or blood coming from your port insertion site.  · Your port insertion site feels warm to the touch.  · You have pus or a bad smell coming from the port   insertion site.  Get help right away if:  · You have chest pain or shortness of breath.  · You have bleeding from your port that you cannot control.  Summary  · Take care of the port as told by your health care provider. Keep the manufacturer's information card with you at all times.  · Change your dressing as told by your health care provider.  · Contact a health care provider if you have a fever or chills or if you have redness, swelling, or pain around your port insertion site.  · Keep all follow-up visits as told by your health care provider.  This information is not intended to replace advice given to you by your health care provider. Make sure you discuss any questions you have with your health care provider.  Document Released: 10/29/2012 Document Revised: 08/06/2017 Document Reviewed:  08/06/2017  Elsevier Interactive Patient Education © 2019 [employer]

## 2018-05-26 NOTE — Patient Instructions (Signed)
Daratumumab injection What is this medicine? DARATUMUMAB (dar a toom ue mab) is a monoclonal antibody. It is used to treat multiple myeloma. This medicine may be used for other purposes; ask your health care provider or pharmacist if you have questions. COMMON BRAND NAME(S): DARZALEX What should I tell my health care provider before I take this medicine? They need to know if you have any of these conditions: -infection (especially a virus infection such as chickenpox, herpes, or hepatitis B virus) -lung or breathing disease -an unusual or allergic reaction to daratumumab, other medicines, foods, dyes, or preservatives -pregnant or trying to get pregnant -breast-feeding How should I use this medicine? This medicine is for infusion into a vein. It is given by a health care professional in a hospital or clinic setting. Talk to your pediatrician regarding the use of this medicine in children. Special care may be needed. Overdosage: If you think you have taken too much of this medicine contact a poison control center or emergency room at once. NOTE: This medicine is only for you. Do not share this medicine with others. What if I miss a dose? Keep appointments for follow-up doses as directed. It is important not to miss your dose. Call your doctor or health care professional if you are unable to keep an appointment. What may interact with this medicine? Interactions have not been studied. Give your health care provider a list of all the medicines, herbs, non-prescription drugs, or dietary supplements you use. Also tell them if you smoke, drink alcohol, or use illegal drugs. Some items may interact with your medicine. This list may not describe all possible interactions. Give your health care provider a list of all the medicines, herbs, non-prescription drugs, or dietary supplements you use. Also tell them if you smoke, drink alcohol, or use illegal drugs. Some items may interact with your  medicine. What should I watch for while using this medicine? This drug may make you feel generally unwell. Report any side effects. Continue your course of treatment even though you feel ill unless your doctor tells you to stop. This medicine can cause serious allergic reactions. To reduce your risk you may need to take medicine before treatment with this medicine. Take your medicine as directed. This medicine can affect the results of blood tests to match your blood type. These changes can last for up to 6 months after the final dose. Your healthcare provider will do blood tests to match your blood type before you start treatment. Tell all of your healthcare providers that you are being treated with this medicine before receiving a blood transfusion. This medicine can affect the results of some tests used to determine treatment response; extra tests may be needed to evaluate response. Do not become pregnant while taking this medicine or for 3 months after stopping it. Women should inform their doctor if they wish to become pregnant or think they might be pregnant. There is a potential for serious side effects to an unborn child. Talk to your health care professional or pharmacist for more information. What side effects may I notice from receiving this medicine? Side effects that you should report to your doctor or health care professional as soon as possible: -allergic reactions like skin rash, itching or hives, swelling of the face, lips, or tongue -breathing problems -chills -cough -dizziness -feeling faint or lightheaded -headache -low blood counts - this medicine may decrease the number of white blood cells, red blood cells and platelets. You may be at increased   risk for infections and bleeding. -nausea, vomiting -shortness of breath -signs of decreased platelets or bleeding - bruising, pinpoint red spots on the skin, black, tarry stools, blood in the urine -signs of decreased red blood  cells - unusually weak or tired, feeling faint or lightheaded, falls -signs of infection - fever or chills, cough, sore throat, pain or difficulty passing urine -signs and symptoms of liver injury like dark yellow or brown urine; general ill feeling or flu-like symptoms; light-colored stools; loss of appetite; right upper belly pain; unusually weak or tired; yellowing of the eyes or skin Side effects that usually do not require medical attention (report to your doctor or health care professional if they continue or are bothersome): -back pain -constipation -loss of appetite -diarrhea -joint pain -muscle cramps -pain, tingling, numbness in the hands or feet -swelling of the ankles, feet, hands -tiredness -trouble sleeping This list may not describe all possible side effects. Call your doctor for medical advice about side effects. You may report side effects to FDA at 1-800-FDA-1088. Where should I keep my medicine? Keep out of the reach of children. This drug is given in a hospital or clinic and will not be stored at home. NOTE: This sheet is a summary. It may not cover all possible information. If you have questions about this medicine, talk to your doctor, pharmacist, or health care provider.  2019 Elsevier/Gold Standard (2017-08-09 15:52:44)

## 2018-05-27 LAB — KAPPA/LAMBDA LIGHT CHAINS
Kappa free light chain: 26.9 mg/L — ABNORMAL HIGH (ref 3.3–19.4)
Kappa, lambda light chain ratio: >17.93 — ABNORMAL HIGH (ref 0.26–1.65)
Lambda free light chains: <1.5 mg/L — ABNORMAL LOW (ref 5.7–26.3)

## 2018-05-27 LAB — IGG, IGA, IGM
IgA: 952 mg/dL — ABNORMAL HIGH (ref 61–437)
IgG (Immunoglobin G), Serum: 194 mg/dL — ABNORMAL LOW (ref 603–1613)
IgM (Immunoglobulin M), Srm: <5 mg/dL — ABNORMAL LOW (ref 15–143)

## 2018-05-28 LAB — IMMUNOFIXATION REFLEX, SERUM
IgA: 978 mg/dL — ABNORMAL HIGH (ref 61–437)
IgG (Immunoglobin G), Serum: 203 mg/dL — ABNORMAL LOW (ref 603–1613)
IgM (Immunoglobulin M), Srm: <5 mg/dL — ABNORMAL LOW (ref 15–143)

## 2018-05-28 LAB — PROTEIN ELECTROPHORESIS, SERUM, WITH REFLEX
A/G Ratio: 1.4 (ref 0.7–1.7)
Albumin ELP: 3.8 g/dL (ref 2.9–4.4)
Alpha-1-Globulin: 0.2 g/dL (ref 0.0–0.4)
Alpha-2-Globulin: 0.9 g/dL (ref 0.4–1.0)
Beta Globulin: 0.8 g/dL (ref 0.7–1.3)
Gamma Globulin: 0.9 g/dL (ref 0.4–1.8)
Globulin, Total: 2.7 g/dL (ref 2.2–3.9)
M-Spike, %: 0.4 g/dL — ABNORMAL HIGH
SPEP Interpretation: 0
Total Protein ELP: 6.5 g/dL (ref 6.0–8.5)

## 2018-05-29 ENCOUNTER — Telehealth: Payer: Self-pay | Admitting: *Deleted

## 2018-05-29 DIAGNOSIS — M545 Low back pain: Secondary | ICD-10-CM | POA: Diagnosis not present

## 2018-05-29 DIAGNOSIS — M5136 Other intervertebral disc degeneration, lumbar region: Secondary | ICD-10-CM | POA: Diagnosis not present

## 2018-05-29 NOTE — Telephone Encounter (Signed)
Patient's wife notifying the office that patient has had increasing issues with urinary incontinence. She would like to know if there is anything that can be prescribed for this.   Spoke to Dr Marin Olp. There isn't anything that can be prescribed. Dr Marin Olp recommends checking with his PCP for any possible assessment and treatment. She understands.

## 2018-06-06 ENCOUNTER — Telehealth: Payer: Self-pay | Admitting: *Deleted

## 2018-06-06 NOTE — Telephone Encounter (Signed)
Call received from patient's wife to inform Dr. Marin Olp that patient's shoulder surgery will be on 06/25/18 at 30.  Dr. Marin Olp notified.

## 2018-06-18 ENCOUNTER — Other Ambulatory Visit: Payer: Self-pay | Admitting: Internal Medicine

## 2018-06-18 NOTE — Patient Instructions (Addendum)
[facility]  06/18/2018   Your procedure is scheduled on: 06-25-18    Report to [facility] Main  Entrance    Report to Admitting at 6:00 AM   YOU NEED TO HAVE A COVID 19 TEST ON 06-23-18 @ 1:00 PM, THIS TEST MUST BE DONE BEFORE SURGERY, COME TO [place] ENTRANCE BETWEEN THE HOURS OF 900 AM AND 300 PM ON YOUR COVID TEST DATE. ONCE YOU HAVE YOUR COVID 19 TEST COMPLETED, PLEASE FOLLOW THE QUARANTINE PER YOUR HANDOUT INSTRUCTIONS!   Call this number if you have problems the morning of surgery [phone]    Remember: NO SOLID FOOD AFTER MIDNIGHT THE NIGHT PRIOR TO SURGERY. NOTHING BY MOUTH EXCEPT CLEAR LIQUIDS UNTIL 3 HOURS PRIOR TO SCHEDULED SURGERY. PLEASE FINISH ENSURE DRINK PER SURGEON ORDER 3 HOURS PRIOR TO SCHEDULED SURGERY TIME WHICH NEEDS TO BE COMPLETED AT 4:30 AM.   CLEAR LIQUID DIET   Foods Allowed                                                                     Foods Excluded  Coffee and tea, regular and decaf                             liquids that you cannot  Plain Jell-O in any flavor                                             see through such as: Fruit ices (not with fruit pulp)                                     milk, soups, orange juice  Iced Popsicles                                    All solid food Carbonated beverages, regular and diet                                    Cranberry, grape and apple juices Sports drinks like Gatorade Lightly seasoned clear broth or consume(fat free) Sugar, honey syrup  Sample Menu Breakfast                                Lunch                                     Supper Cranberry juice                    Beef broth  Chicken broth Jell-O                                     Grape juice                           Apple juice Coffee or tea                        Jell-O                                      Popsicle                                                 Coffee or tea                        Coffee or tea  _____________________________________________________________________      BRUSH YOUR TEETH MORNING OF SURGERY AND RINSE YOUR MOUTH OUT, NO CHEWING GUM CANDY OR MINTS.     Take these medicines the morning of surgery with A SIP OF WATER: Metoprolol Tartrate                                 You may not have any metal on your body including hair pins and              piercings     Do not wear jewelry, cologne, lotions, powders or deodorant                       Men may shave face and neck.   Do not bring valuables to the hospital. [name].  Contacts, dentures or bridgework may not be worn into surgery.    Special Instructions: N/A              Please read over the following fact sheets you were given: _____________________________________________________________________             [name] - Preparing for Surgery Before surgery, you can play an important role.  Because skin is not sterile, your skin needs to be as free of germs as possible.  You can reduce the number of germs on your skin by washing with CHG (chlorahexidine gluconate) soap before surgery.  CHG is an antiseptic cleaner which kills germs and bonds with the skin to continue killing germs even after washing. Please DO NOT use if you have an allergy to CHG or antibacterial soaps.  If your skin becomes reddened/irritated stop using the CHG and inform your nurse when you arrive at Short Stay. Do not shave (including legs and underarms) for at least 48 hours prior to the first CHG shower.  You may shave your face/neck. Please follow these instructions carefully:  1.  Shower with CHG Soap the night before surgery and the  morning of Surgery.  2.  If you choose to wash your hair, wash your  hair first as usual with your  normal  shampoo.  3.  After you shampoo, rinse your hair and body thoroughly to remove the   shampoo.                           4.  Use CHG as you would any other liquid soap.  You can apply chg directly  to the skin and wash                       Gently with a scrungie or clean washcloth.  5.  Apply the CHG Soap to your body ONLY FROM THE NECK DOWN.   Do not use on face/ open                           Wound or open sores. Avoid contact with eyes, ears mouth and genitals (private parts).                       Wash face,  Genitals (private parts) with your normal soap.             6.  Wash thoroughly, paying special attention to the area where your surgery  will be performed.  7.  Thoroughly rinse your body with warm water from the neck down.  8.  DO NOT shower/wash with your normal soap after using and rinsing off  the CHG Soap.                9.  Pat yourself dry with a clean towel.            10.  Wear clean pajamas.            11.  Place clean sheets on your bed the night of your first shower and do not  sleep with pets. Day of Surgery : Do not apply any lotions/deodorants the morning of surgery.  Please wear clean clothes to the hospital/surgery center.  FAILURE TO FOLLOW THESE INSTRUCTIONS MAY RESULT IN THE CANCELLATION OF YOUR SURGERY PATIENT SIGNATURE_________________________________  NURSE SIGNATURE__________________________________  ________________________________________________________________________   [place]  An incentive spirometer is a tool that can help keep your lungs clear and active. This tool measures how well you are filling your lungs with each breath. Taking long deep breaths may help reverse or decrease the chance of developing breathing (pulmonary) problems (especially infection) following:  A long period of time when you are unable to move or be active. BEFORE THE PROCEDURE   If the spirometer includes an indicator to show your best effort, your nurse or respiratory therapist will set it to a desired goal.  If possible, sit up straight  or lean slightly forward. Try not to slouch.  Hold the incentive spirometer in an upright position. INSTRUCTIONS FOR USE  1. Sit on the edge of your bed if possible, or sit up as far as you can in bed or on a chair. 2. Hold the incentive spirometer in an upright position. 3. Breathe out normally. 4. Place the mouthpiece in your mouth and seal your lips tightly around it. 5. Breathe in slowly and as deeply as possible, raising the piston or the ball toward the top of the column. 6. Hold your breath for 3-5 seconds or for as long as possible. Allow the piston or ball to fall to the bottom of the column. 7.  Remove the mouthpiece from your mouth and breathe out normally. 8. Rest for a few seconds and repeat Steps 1 through 7 at least 10 times every 1-2 hours when you are awake. Take your time and take a few normal breaths between deep breaths. 9. The spirometer may include an indicator to show your best effort. Use the indicator as a goal to work toward during each repetition. 10. After each set of 10 deep breaths, practice coughing to be sure your lungs are clear. If you have an incision (the cut made at the time of surgery), support your incision when coughing by placing a pillow or rolled up towels firmly against it. Once you are able to get out of bed, walk around indoors and cough well. You may stop using the incentive spirometer when instructed by your caregiver.  RISKS AND COMPLICATIONS  Take your time so you do not get dizzy or light-headed.  If you are in pain, you may need to take or ask for pain medication before doing incentive spirometry. It is harder to take a deep breath if you are having pain. AFTER USE  Rest and breathe slowly and easily.  It can be helpful to keep track of a log of your progress. Your caregiver can provide you with a simple table to help with this. If you are using the spirometer at home, follow these instructions: [place] IF:   You are having  difficultly using the spirometer.  You have trouble using the spirometer as often as instructed.  Your pain medication is not giving enough relief while using the spirometer.  You develop fever of 100.5 F (38.1 C) or higher. SEEK IMMEDIATE MEDICAL CARE IF:   You cough up bloody sputum that had not been present before.  You develop fever of 102 F (38.9 C) or greater.  You develop worsening pain at or near the incision site. MAKE SURE YOU:   Understand these instructions.  Will watch your condition.  Will get help right away if you are not doing well or get worse. Document Released: 05/21/2006 Document Revised: 04/02/2011 Document Reviewed: 07/22/2006 [facility] Patient Information 2014 [place], [place].   ________________________________________________________________________

## 2018-06-18 NOTE — Progress Notes (Signed)
SPOKE W/Pt _     SCREENING SYMPTOMS OF COVID 19:   COUGH--No  RUNNY NOSE--- No  SORE THROAT---No  NASAL CONGESTION----No  SNEEZING----No  SHORTNESS OF BREATH---No  DIFFICULTY BREATHING---No  TEMP >100.0 -----No  UNEXPLAINED BODY ACHES------No  CHILLS --------No   HEADACHES ---------No  LOSS OF SMELL/ TASTE --------No    HAVE YOU OR ANY FAMILY MEMBER TRAVELLED PAST 14 DAYS OUT OF THE   COUNTY---No STATE----No COUNTRY---No-  HAVE YOU OR ANY FAMILY MEMBER BEEN EXPOSED TO ANYONE WITH COVID 19? No    

## 2018-06-18 NOTE — Progress Notes (Signed)
03-27-18 (Epic) EKG  03-28-18 (Epic) Surgical Clearance from Dr. Larose Kells on chart  03-11-18 [facility]) Surgical Clearance from Dr. Marin Olp on chart

## 2018-06-19 ENCOUNTER — Encounter (HOSPITAL_COMMUNITY): Payer: Self-pay

## 2018-06-19 ENCOUNTER — Encounter (HOSPITAL_COMMUNITY)
Admission: RE | Admit: 2018-06-19 | Discharge: 2018-06-19 | Disposition: A | Discharge status: Home / Self Care | Payer: PPO | Source: Ambulatory Visit | Attending: Orthopaedic Surgery | Admitting: Orthopaedic Surgery

## 2018-06-19 ENCOUNTER — Other Ambulatory Visit: Payer: Self-pay

## 2018-06-19 DIAGNOSIS — M12811 Other specific arthropathies, not elsewhere classified, right shoulder: Secondary | ICD-10-CM | POA: Diagnosis not present

## 2018-06-19 DIAGNOSIS — Z01812 Encounter for preprocedural laboratory examination: Secondary | ICD-10-CM | POA: Insufficient documentation

## 2018-06-19 LAB — BASIC METABOLIC PANEL
Anion gap: 10 (ref 5–15)
BUN: 16 mg/dL (ref 8–23)
CO2: 26 mmol/L (ref 22–32)
Calcium: 9.7 mg/dL (ref 8.9–10.3)
Chloride: 106 mmol/L (ref 98–111)
Creatinine, Ser: 0.78 mg/dL (ref 0.61–1.24)
GFR calc Af Amer: >60 mL/min (ref >60)
GFR calc non Af Amer: >60 mL/min (ref >60)
Glucose, Bld: 96 mg/dL (ref 70–99)
Potassium: 4.2 mmol/L (ref 3.5–5.1)
Sodium: 142 mmol/L (ref 135–145)

## 2018-06-19 LAB — CBC
HCT: 40.3 % (ref 39.0–52.0)
Hemoglobin: 13.1 g/dL (ref 13.0–17.0)
MCH: 34.1 pg — ABNORMAL HIGH (ref 26.0–34.0)
MCHC: 32.5 g/dL (ref 30.0–36.0)
MCV: 104.9 fL — ABNORMAL HIGH (ref 80.0–100.0)
Platelets: 161 10*3/uL (ref 150–400)
RBC: 3.84 MIL/uL — ABNORMAL LOW (ref 4.22–5.81)
RDW: 13.7 % (ref 11.5–15.5)
WBC: 5.8 10*3/uL (ref 4.0–10.5)
nRBC: 0 % (ref 0.0–0.2)

## 2018-06-19 LAB — SURGICAL PCR SCREEN
MRSA, PCR: NEGATIVE
Staphylococcus aureus: NEGATIVE

## 2018-06-23 ENCOUNTER — Other Ambulatory Visit (HOSPITAL_COMMUNITY)
Admission: RE | Admit: 2018-06-23 | Discharge: 2018-06-23 | Disposition: A | Discharge status: Home / Self Care | Payer: PPO | Source: Ambulatory Visit | Attending: Orthopaedic Surgery | Admitting: Orthopaedic Surgery

## 2018-06-23 DIAGNOSIS — Z1159 Encounter for screening for other viral diseases: Secondary | ICD-10-CM | POA: Insufficient documentation

## 2018-06-23 LAB — SARS CORONAVIRUS 2 BY RT PCR (HOSPITAL ORDER, PERFORMED IN ~~LOC~~ HOSPITAL LAB): SARS Coronavirus 2: NEGATIVE

## 2018-06-23 NOTE — Progress Notes (Signed)
Anesthesia Chart Review   Case:  [id number] Date/Time:  06/25/18 0815   Procedure:  REVERSE SHOULDER ARTHROPLASTY (Right )   Anesthesia type:  Choice   Pre-op diagnosis:  djd right shoulder   Location:  [facility] ROOM 06 / [facility]   Surgeon:  Hiram Gash, MD      DISCUSSION: 78 yo former smoker (5 pack years, quit 11/28/74) with h/o barrett's esophagus, GERD, HLD, CAD, HTN, multiple myeloma, DJD right shoulder scheduled for above procedure 06/25/2018 with Dr. Ophelia Charter.    Clearance also received from PCP, Dr. [name] 03/28/2018 which states, "Mr. Bejarano is a patient of mine, he is currently stable, he is clear from my standpoint to undergo the shoulder surgery.  Please also obtain clearance from his hematologist."  Clearance received from Dr. Marin Olp (on chart) which states pt is cleared from Hem/Onc point of view.  Last seen by Dr. Marin Olp 05/26/2018.  Per his note, "Thankfully, his myeloma has been holding steady.  His last M spike was 0.4 g/dL.  His IgA level was 740 mg/dL.  His kappa light chain was 2.2 mg/dL."  Anticipate pt can proceed with planned procedure barring acute status change.  VS: BP (!) 172/96   Pulse 92   Temp 37.1 C (Oral)   Resp 18   Ht 5' 4"  (1.626 m)   Wt 96.5 kg   SpO2 95%   BMI 36.51 kg/m   PROVIDERS: Colon Branch, MD is PCP   Burney Gauze, MD with Hem/Onc LABS: Labs reviewed: Acceptable for surgery. (all labs ordered are listed, but only abnormal results are displayed)  Labs Reviewed  CBC - Abnormal; Notable for the following components:      Result Value   RBC 3.84 (*)    MCV 104.9 (*)    MCH 34.1 (*)    All other components within normal limits  SURGICAL PCR SCREEN  BASIC METABOLIC PANEL     IMAGES:   EKG: 03/27/2018 Rate 80 bpm Sinus rhythm  Old anteroseptal infarct   CV: Echo 12/13/2015 Study Conclusions  - Left ventricle: The cavity size was normal. There was mild   concentric hypertrophy. Systolic function was normal. The  estimated ejection fraction was in the range of 60% to 65%. Wall   motion was normal; there were no regional wall motion   abnormalities. Doppler parameters are consistent with abnormal   left ventricular relaxation (grade 1 diastolic dysfunction).   Doppler parameters are consistent with indeterminate ventricular   filling pressure. - Aortic valve: Transvalvular velocity was within the normal range.   There was no stenosis. There was no regurgitation. - Mitral valve: Calcified annulus. Transvalvular velocity was   within the normal range. There was no evidence for stenosis.   There was mild regurgitation. - Right ventricle: The cavity size was normal. Wall thickness was   normal. Systolic function was normal. - Atrial septum: A patent foramen ovale cannot be excluded. - Tricuspid valve: There was mild regurgitation. - Pulmonary arteries: Systolic pressure was within the normal   range. PA peak pressure: 28 mm Hg (S).  Stress Test 11/19/2015 IMPRESSION: 1. No reversible ischemia or infarction.  2. Normal left ventricular wall motion.  3. Left ventricular ejection fraction 67%  4. Non invasive risk stratification*: Low Past Medical History:  Diagnosis Date  . Arthritis   . 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 Blood dyscrasia    low WBC d/t multiple myeloma  . Bulging lumbar  disc    L2-3  . Carotid bruit    3-11 carotid u/s was  (-)  . 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 GERD (gastroesophageal reflux disease)   . Headache   . Heart murmur   . HYPERLIPIDEMIA 05/03/2006  . HYPERTENSION 05/03/2006  . 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 Torn rotator cuff 01/22/2017   left- [place]    Past Surgical History:  Procedure Laterality Date  . CERVICAL FUSION    . IR FLUORO GUIDE  PORT INSERTION RIGHT  11/29/2016  . IR US GUIDE VASC ACCESS RIGHT  11/29/2016  . [place]   right  . ROTATOR CUFF REPAIR     right  . SPINAL FUSION     lumbar  . TOTAL KNEE ARTHROPLASTY Right 12/23/2015   Procedure: TOTAL KNEE ARTHROPLASTY;  Surgeon: Earlie Server, MD;  Location: [place];  Service: Orthopedics;  Laterality: Right;    MEDICATIONS: . acetaminophen (TYLENOL) 325 MG tablet  . aspirin 325 MG tablet  . atorvastatin (LIPITOR) 10 MG tablet  . calcium carbonate (OSCAL) 1500 (600 Ca) MG TABS tablet  . Cyanocobalamin (VITAMIN B-12 PO)  . Daratumumab (DARZALEX IV)  . dexamethasone (DECADRON) 2 MG tablet  . fluticasone (FLONASE) 50 MCG/ACT nasal spray  . ibuprofen (ADVIL,MOTRIN) 200 MG tablet  . lidocaine (XYLOCAINE) 5 % ointment  . methocarbamol (ROBAXIN) 500 MG tablet  . metoprolol tartrate (LOPRESSOR) 100 MG tablet  . Multiple Vitamin (MULTIVITAMIN) tablet  . Omega-3 Fatty Acids (FISH OIL) 1200 MG CAPS  . ondansetron (ZOFRAN) 4 MG tablet  . oxyCODONE (OXY IR/ROXICODONE) 5 MG immediate release tablet  . pantoprazole (PROTONIX) 40 MG tablet  . Probiotic Product (PROBIOTIC PO)   No current facility-administered medications for this encounter.    . daratumumab (DARZALEX) 1,600 mg in sodium chloride 0.9 % 420 mL (3.2 mg/mL) chemo infusion  . sodium chloride flush (NS) 0.9 % injection 10 mL  . sodium chloride flush (NS) 0.9 % injection 10 mL     Maia Plan [facility] Pre-Surgical Testing [phone] 06/23/18 12:18 PM

## 2018-06-23 NOTE — Anesthesia Preprocedure Evaluation (Addendum)
Anesthesia Evaluation  Patient identified by MRN, date of birth, ID band Patient awake    Reviewed: Allergy & Precautions, NPO status , Patient's Chart, lab work & pertinent test results  Airway Mallampati: II  TM Distance: >3 FB Neck ROM: Full    Dental no notable dental hx.    Pulmonary neg pulmonary ROS, former smoker,    Pulmonary exam normal breath sounds clear to auscultation       Cardiovascular hypertension, Pt. on medications + CAD  Normal cardiovascular exam Rhythm:Regular Rate:Normal     Neuro/Psych negative neurological ROS  negative psych ROS   GI/Hepatic Neg liver ROS, GERD  Medicated and Controlled,  Endo/Other  negative endocrine ROS  Renal/GU negative Renal ROS  negative genitourinary   Musculoskeletal negative musculoskeletal ROS (+)   Abdominal   Peds negative pediatric ROS (+)  Hematology  (+) Blood dyscrasia (multiple myeloma), ,   Anesthesia Other Findings   Reproductive/Obstetrics negative OB ROS                                                              Anesthesia Evaluation  Patient identified by MRN, date of birth, ID band Patient awake    Reviewed: Allergy & Precautions, NPO status , Patient's Chart, lab work & pertinent test results, reviewed documented beta blocker date and time   Airway Mallampati: II  TM Distance: >3 FB Neck ROM: Full    Dental  (+) Partial Lower, Dental Advisory Given, Caps   Pulmonary former smoker,    breath sounds clear to auscultation       Cardiovascular hypertension, Pt. on home beta blockers and Pt. on medications + CAD   Rhythm:Regular Rate:Normal     Neuro/Psych  Headaches, negative psych ROS   GI/Hepatic Neg liver ROS, PUD, GERD  Medicated,  Endo/Other  negative endocrine ROS  Renal/GU negative Renal ROS  negative genitourinary   Musculoskeletal  (+) Arthritis , Osteoarthritis,    Abdominal   Peds negative pediatric ROS (+)  Hematology Multiple myeloma   Anesthesia Other Findings   Reproductive/Obstetrics negative OB ROS                          Lab Results  Component Value Date   WBC 5.8 06/19/2018   HGB 13.1 06/19/2018   HCT 40.3 06/19/2018   MCV 104.9 (H) 06/19/2018   PLT 161 06/19/2018   Lab Results  Component Value Date   CREATININE 0.78 06/19/2018   BUN 16 06/19/2018   NA 142 06/19/2018   K 4.2 06/19/2018   CL 106 06/19/2018   CO2 26 06/19/2018   Lab Results  Component Value Date   INR 0.91 11/29/2016   INR 1.02 12/12/2015   INR 4.14 (H) 04/10/2010   PROTIME Sent out for confirmation 04/10/2010     Anesthesia Physical Anesthesia Plan  ASA: II  Anesthesia Plan: Spinal   Post-op Pain Management:  Regional for Post-op pain   Induction: Intravenous  Airway Management Planned: Natural Airway  Additional Equipment:   Intra-op Plan:   Post-operative Plan:   Informed Consent: I have reviewed the patients History and Physical, chart, labs and discussed the procedure including the risks, benefits and alternatives for the proposed anesthesia with the patient or authorized  representative who has indicated his/her understanding and acceptance.     Plan Discussed with: CRNA  Anesthesia Plan Comments:         Anesthesia Quick Evaluation  Anesthesia Physical Anesthesia Plan  ASA: III  Anesthesia Plan: General   Post-op Pain Management:  Regional for Post-op pain   Induction: Intravenous  PONV Risk Score and Plan: 2 and Ondansetron and Treatment may vary due to age or medical condition  Airway Management Planned: Oral ETT  Additional Equipment:   Intra-op Plan:   Post-operative Plan: Extubation in OR  Informed Consent: I have reviewed the patients History and Physical, chart, labs and discussed the procedure including the risks, benefits and alternatives for the proposed anesthesia with the  patient or authorized representative who has indicated his/her understanding and acceptance.     Dental advisory given  Plan Discussed with: CRNA  Anesthesia Plan Comments: (See PAT note 06/19/2018, Konrad Felix, PA-C)       Anesthesia Quick Evaluation

## 2018-06-24 NOTE — Progress Notes (Signed)
SPOKE W/  _ LEFT VM TO CALL IF ANY SYMPTOMS     SCREENING SYMPTOMS OF COVID 19:   COUGH--  RUNNY NOSE---   SORE THROAT---  NASAL CONGESTION----  SNEEZING----  SHORTNESS OF BREATH---  DIFFICULTY BREATHING---  TEMP >100.0 -----  UNEXPLAINED BODY ACHES------  CHILLS --------   HEADACHES ---------  LOSS OF SMELL/ TASTE --------    HAVE YOU OR ANY FAMILY MEMBER TRAVELLED PAST 14 DAYS OUT OF THE   COUNTY--- STATE---- COUNTRY----  HAVE YOU OR ANY FAMILY MEMBER BEEN EXPOSED TO ANYONE WITH COVID 19?

## 2018-06-25 ENCOUNTER — Encounter (HOSPITAL_COMMUNITY)
Admission: RE | Disposition: A | Discharge status: Home / Self Care | Payer: Self-pay | Source: Home / Self Care | Attending: Orthopaedic Surgery

## 2018-06-25 ENCOUNTER — Inpatient Hospital Stay (HOSPITAL_COMMUNITY): Payer: PPO | Admitting: Physician Assistant

## 2018-06-25 ENCOUNTER — Other Ambulatory Visit: Payer: Self-pay

## 2018-06-25 ENCOUNTER — Encounter (HOSPITAL_COMMUNITY): Payer: Self-pay | Admitting: Registered Nurse

## 2018-06-25 ENCOUNTER — Inpatient Hospital Stay (HOSPITAL_COMMUNITY): Payer: PPO

## 2018-06-25 ENCOUNTER — Inpatient Hospital Stay (HOSPITAL_COMMUNITY): Payer: PPO | Admitting: Registered Nurse

## 2018-06-25 ENCOUNTER — Inpatient Hospital Stay (HOSPITAL_COMMUNITY)
Admission: RE | Admit: 2018-06-25 | Discharge: 2018-06-26 | DRG: 483 | Disposition: A | Discharge status: Home / Self Care | Payer: PPO | Attending: Orthopaedic Surgery | Admitting: Orthopaedic Surgery

## 2018-06-25 DIAGNOSIS — Z87891 Personal history of nicotine dependence: Secondary | ICD-10-CM

## 2018-06-25 DIAGNOSIS — Z96651 Presence of right artificial knee joint: Secondary | ICD-10-CM | POA: Diagnosis present

## 2018-06-25 DIAGNOSIS — K2270 Barrett's esophagus without dysplasia: Secondary | ICD-10-CM | POA: Diagnosis not present

## 2018-06-25 DIAGNOSIS — M75101 Unspecified rotator cuff tear or rupture of right shoulder, not specified as traumatic: Secondary | ICD-10-CM | POA: Diagnosis present

## 2018-06-25 DIAGNOSIS — Z8711 Personal history of peptic ulcer disease: Secondary | ICD-10-CM | POA: Diagnosis not present

## 2018-06-25 DIAGNOSIS — Z981 Arthrodesis status: Secondary | ICD-10-CM | POA: Diagnosis not present

## 2018-06-25 DIAGNOSIS — Z791 Long term (current) use of non-steroidal anti-inflammatories (NSAID): Secondary | ICD-10-CM

## 2018-06-25 DIAGNOSIS — K219 Gastro-esophageal reflux disease without esophagitis: Secondary | ICD-10-CM | POA: Diagnosis present

## 2018-06-25 DIAGNOSIS — Z8579 Personal history of other malignant neoplasms of lymphoid, hematopoietic and related tissues: Secondary | ICD-10-CM | POA: Diagnosis not present

## 2018-06-25 DIAGNOSIS — Z7982 Long term (current) use of aspirin: Secondary | ICD-10-CM

## 2018-06-25 DIAGNOSIS — M659 Synovitis and tenosynovitis, unspecified: Secondary | ICD-10-CM | POA: Diagnosis not present

## 2018-06-25 DIAGNOSIS — Z471 Aftercare following joint replacement surgery: Secondary | ICD-10-CM | POA: Diagnosis not present

## 2018-06-25 DIAGNOSIS — I10 Essential (primary) hypertension: Secondary | ICD-10-CM | POA: Diagnosis not present

## 2018-06-25 DIAGNOSIS — Z79899 Other long term (current) drug therapy: Secondary | ICD-10-CM | POA: Diagnosis not present

## 2018-06-25 DIAGNOSIS — E785 Hyperlipidemia, unspecified: Secondary | ICD-10-CM | POA: Diagnosis present

## 2018-06-25 DIAGNOSIS — Z8051 Family history of malignant neoplasm of kidney: Secondary | ICD-10-CM | POA: Diagnosis not present

## 2018-06-25 DIAGNOSIS — Z7951 Long term (current) use of inhaled steroids: Secondary | ICD-10-CM | POA: Diagnosis not present

## 2018-06-25 DIAGNOSIS — Z09 Encounter for follow-up examination after completed treatment for conditions other than malignant neoplasm: Secondary | ICD-10-CM

## 2018-06-25 DIAGNOSIS — M19011 Primary osteoarthritis, right shoulder: Secondary | ICD-10-CM | POA: Diagnosis not present

## 2018-06-25 DIAGNOSIS — Z823 Family history of stroke: Secondary | ICD-10-CM | POA: Diagnosis not present

## 2018-06-25 DIAGNOSIS — G8918 Other acute postprocedural pain: Secondary | ICD-10-CM | POA: Diagnosis not present

## 2018-06-25 DIAGNOSIS — Z96611 Presence of right artificial shoulder joint: Secondary | ICD-10-CM | POA: Diagnosis not present

## 2018-06-25 DIAGNOSIS — I2510 Atherosclerotic heart disease of native coronary artery without angina pectoris: Secondary | ICD-10-CM | POA: Diagnosis not present

## 2018-06-25 DIAGNOSIS — M12811 Other specific arthropathies, not elsewhere classified, right shoulder: Secondary | ICD-10-CM | POA: Diagnosis present

## 2018-06-25 DIAGNOSIS — Z8249 Family history of ischemic heart disease and other diseases of the circulatory system: Secondary | ICD-10-CM | POA: Diagnosis not present

## 2018-06-25 DIAGNOSIS — Z806 Family history of leukemia: Secondary | ICD-10-CM | POA: Diagnosis not present

## 2018-06-25 HISTORY — PX: REVERSE SHOULDER ARTHROPLASTY: SHX5054

## 2018-06-25 SURGERY — ARTHROPLASTY, SHOULDER, TOTAL, REVERSE
Anesthesia: General | Laterality: Right

## 2018-06-25 MED ORDER — MIDAZOLAM HCL 5 MG/5ML IJ SOLN
INTRAMUSCULAR | Status: DC | PRN
  Administered 2018-06-25: 2 mg via INTRAVENOUS

## 2018-06-25 MED ORDER — SUGAMMADEX SODIUM 500 MG/5ML IV SOLN
INTRAVENOUS | Status: DC | PRN
  Administered 2018-06-25: 300 mg via INTRAVENOUS

## 2018-06-25 MED ORDER — DOCUSATE SODIUM 100 MG PO CAPS
100.0000 mg | ORAL_CAPSULE | Freq: Two times a day (BID) | ORAL | Status: DC
  Administered 2018-06-25 – 2018-06-26 (×2): 100 mg via ORAL
  Filled 2018-06-25 (×2): qty 1

## 2018-06-25 MED ORDER — PROPOFOL 10 MG/ML IV BOLUS
INTRAVENOUS | Status: AC
  Filled 2018-06-25: qty 20

## 2018-06-25 MED ORDER — METOCLOPRAMIDE HCL 5 MG/ML IJ SOLN: 5.0000 mg | Freq: Three times a day (TID) | INTRAMUSCULAR | Status: DC | PRN

## 2018-06-25 MED ORDER — ACETAMINOPHEN 500 MG PO TABS: 1000.0000 mg | ORAL_TABLET | Freq: Three times a day (TID) | ORAL | 0 refills | Status: AC

## 2018-06-25 MED ORDER — BUPIVACAINE HCL (PF) 0.5 % IJ SOLN
INTRAMUSCULAR | Status: DC | PRN
  Administered 2018-06-25: 15 mL via PERINEURAL

## 2018-06-25 MED ORDER — METOPROLOL TARTRATE 50 MG PO TABS
50.0000 mg | ORAL_TABLET | Freq: Two times a day (BID) | ORAL | Status: DC
  Administered 2018-06-25 – 2018-06-26 (×2): 50 mg via ORAL
  Filled 2018-06-25 (×2): qty 1

## 2018-06-25 MED ORDER — MEPERIDINE HCL 50 MG/ML IJ SOLN: 6.2500 mg | INTRAMUSCULAR | Status: DC | PRN

## 2018-06-25 MED ORDER — VANCOMYCIN HCL POWD
Status: DC | PRN
  Administered 2018-06-25: 1000 mg via TOPICAL

## 2018-06-25 MED ORDER — SODIUM CHLORIDE 0.9 % IV SOLN
INTRAVENOUS | Status: DC | PRN
  Administered 2018-06-25: 25 ug/min via INTRAVENOUS

## 2018-06-25 MED ORDER — CELECOXIB 100 MG PO CAPS: 100.0000 mg | ORAL_CAPSULE | Freq: Two times a day (BID) | ORAL | 2 refills | Status: DC

## 2018-06-25 MED ORDER — ROCURONIUM BROMIDE 10 MG/ML (PF) SYRINGE
PREFILLED_SYRINGE | INTRAVENOUS | Status: DC | PRN
  Administered 2018-06-25: 60 mg via INTRAVENOUS

## 2018-06-25 MED ORDER — PROPOFOL 10 MG/ML IV BOLUS
INTRAVENOUS | Status: AC
  Filled 2018-06-25: qty 40

## 2018-06-25 MED ORDER — RIVAROXABAN 10 MG PO TABS
10.0000 mg | ORAL_TABLET | Freq: Every day | ORAL | Status: DC
  Administered 2018-06-26: 10 mg via ORAL
  Filled 2018-06-25: qty 1

## 2018-06-25 MED ORDER — 0.9 % SODIUM CHLORIDE (POUR BTL) OPTIME
TOPICAL | Status: DC | PRN
  Administered 2018-06-25: 1000 mL

## 2018-06-25 MED ORDER — OXYCODONE HCL 5 MG PO TABS: ORAL_TABLET | ORAL | 0 refills | Status: AC

## 2018-06-25 MED ORDER — RIVAROXABAN 10 MG PO TABS: 10.0000 mg | ORAL_TABLET | Freq: Every day | ORAL | 0 refills | Status: DC

## 2018-06-25 MED ORDER — CHLORHEXIDINE GLUCONATE 4 % EX LIQD: 60.0000 mL | Freq: Once | CUTANEOUS | Status: DC

## 2018-06-25 MED ORDER — METOCLOPRAMIDE HCL 5 MG PO TABS: 5.0000 mg | ORAL_TABLET | Freq: Three times a day (TID) | ORAL | Status: DC | PRN

## 2018-06-25 MED ORDER — BUPIVACAINE LIPOSOME 1.3 % IJ SUSP
INTRAMUSCULAR | Status: DC | PRN
  Administered 2018-06-25: 10 mL via PERINEURAL

## 2018-06-25 MED ORDER — ONDANSETRON HCL 4 MG PO TABS: 4.0000 mg | ORAL_TABLET | Freq: Four times a day (QID) | ORAL | Status: DC | PRN

## 2018-06-25 MED ORDER — DEXAMETHASONE SODIUM PHOSPHATE 10 MG/ML IJ SOLN
INTRAMUSCULAR | Status: DC | PRN
  Administered 2018-06-25: 10 mg via INTRAVENOUS

## 2018-06-25 MED ORDER — ONDANSETRON HCL 4 MG/2ML IJ SOLN: 4.0000 mg | Freq: Four times a day (QID) | INTRAMUSCULAR | Status: DC | PRN

## 2018-06-25 MED ORDER — MIDAZOLAM HCL 2 MG/2ML IJ SOLN
INTRAMUSCULAR | Status: AC
  Filled 2018-06-25: qty 2

## 2018-06-25 MED ORDER — DEXAMETHASONE SODIUM PHOSPHATE 10 MG/ML IJ SOLN
INTRAMUSCULAR | Status: AC
  Filled 2018-06-25: qty 1

## 2018-06-25 MED ORDER — FENTANYL CITRATE (PF) 100 MCG/2ML IJ SOLN
INTRAMUSCULAR | Status: DC | PRN
  Administered 2018-06-25: 50 ug via INTRAVENOUS
  Administered 2018-06-25: 100 ug via INTRAVENOUS
  Administered 2018-06-25: 50 ug via INTRAVENOUS

## 2018-06-25 MED ORDER — HYDROMORPHONE HCL 1 MG/ML IJ SOLN: 0.5000 mg | INTRAMUSCULAR | Status: DC | PRN

## 2018-06-25 MED ORDER — OXYCODONE HCL 5 MG PO TABS: 5.0000 mg | ORAL_TABLET | ORAL | Status: DC | PRN

## 2018-06-25 MED ORDER — ONDANSETRON HCL 4 MG PO TABS: 4.0000 mg | ORAL_TABLET | Freq: Three times a day (TID) | ORAL | 1 refills | Status: AC | PRN

## 2018-06-25 MED ORDER — ACETAMINOPHEN 500 MG PO TABS
1000.0000 mg | ORAL_TABLET | Freq: Three times a day (TID) | ORAL | Status: DC
  Administered 2018-06-25 – 2018-06-26 (×3): 1000 mg via ORAL
  Filled 2018-06-25 (×3): qty 2

## 2018-06-25 MED ORDER — BUPIVACAINE-EPINEPHRINE (PF) 0.25% -1:200000 IJ SOLN
INTRAMUSCULAR | Status: AC
  Filled 2018-06-25: qty 30

## 2018-06-25 MED ORDER — ZOLPIDEM TARTRATE 5 MG PO TABS: 5.0000 mg | ORAL_TABLET | Freq: Every evening | ORAL | Status: DC | PRN

## 2018-06-25 MED ORDER — FENTANYL CITRATE (PF) 100 MCG/2ML IJ SOLN: 25.0000 ug | INTRAMUSCULAR | Status: DC | PRN

## 2018-06-25 MED ORDER — ONDANSETRON HCL 4 MG/2ML IJ SOLN
INTRAMUSCULAR | Status: AC
  Filled 2018-06-25: qty 2

## 2018-06-25 MED ORDER — ONDANSETRON HCL 4 MG/2ML IJ SOLN
INTRAMUSCULAR | Status: DC | PRN
  Administered 2018-06-25: 4 mg via INTRAVENOUS

## 2018-06-25 MED ORDER — CEFAZOLIN SODIUM-DEXTROSE 2-4 GM/100ML-% IV SOLN
2.0000 g | INTRAVENOUS | Status: AC
  Administered 2018-06-25: 2 g via INTRAVENOUS

## 2018-06-25 MED ORDER — PANTOPRAZOLE SODIUM 40 MG PO TBEC
40.0000 mg | DELAYED_RELEASE_TABLET | Freq: Every day | ORAL | Status: DC
  Administered 2018-06-25 – 2018-06-26 (×2): 40 mg via ORAL
  Filled 2018-06-25 (×2): qty 1

## 2018-06-25 MED ORDER — SODIUM CHLORIDE 0.9 % IR SOLN
Status: DC | PRN
  Administered 2018-06-25: 1000 mL

## 2018-06-25 MED ORDER — FENTANYL CITRATE (PF) 250 MCG/5ML IJ SOLN
INTRAMUSCULAR | Status: AC
  Filled 2018-06-25: qty 5

## 2018-06-25 MED ORDER — PROPOFOL 10 MG/ML IV BOLUS
INTRAVENOUS | Status: DC | PRN
  Administered 2018-06-25: 50 mg via INTRAVENOUS
  Administered 2018-06-25: 120 mg via INTRAVENOUS

## 2018-06-25 MED ORDER — METOCLOPRAMIDE HCL 5 MG/ML IJ SOLN: 10.0000 mg | Freq: Once | INTRAMUSCULAR | Status: DC | PRN

## 2018-06-25 MED ORDER — STERILE WATER FOR IRRIGATION IR SOLN
Status: DC | PRN
  Administered 2018-06-25: 2000 mL

## 2018-06-25 MED ORDER — LACTATED RINGERS IV SOLN
INTRAVENOUS | Status: DC
  Administered 2018-06-25 (×3): via INTRAVENOUS

## 2018-06-25 MED ORDER — VANCOMYCIN HCL 1000 MG IV SOLR
INTRAVENOUS | Status: AC
  Filled 2018-06-25: qty 1000

## 2018-06-25 MED ORDER — DIPHENHYDRAMINE HCL 12.5 MG/5ML PO ELIX: 12.5000 mg | ORAL_SOLUTION | ORAL | Status: DC | PRN

## 2018-06-25 MED ORDER — CEFAZOLIN SODIUM-DEXTROSE 1-4 GM/50ML-% IV SOLN
1.0000 g | Freq: Four times a day (QID) | INTRAVENOUS | Status: AC
  Administered 2018-06-25 – 2018-06-26 (×3): 1 g via INTRAVENOUS
  Filled 2018-06-25 (×4): qty 50

## 2018-06-25 MED ORDER — LIDOCAINE 2% (20 MG/ML) 5 ML SYRINGE
INTRAMUSCULAR | Status: DC | PRN
  Administered 2018-06-25: 25 mg via INTRAVENOUS
  Administered 2018-06-25: 75 mg via INTRAVENOUS

## 2018-06-25 MED ORDER — ISOPROPYL ALCOHOL 70 % SOLN
Status: AC
  Filled 2018-06-25: qty 480

## 2018-06-25 MED FILL — Vancomycin HCl For IV Soln 1 GM (Base Equivalent): INTRAVENOUS | Qty: 1000 | Status: AC

## 2018-06-25 SURGICAL SUPPLY — 62 items
AID PSTN UNV HD RSTRNT DISP (MISCELLANEOUS) ×1
BASEPLATE GLENOSPHERE 25 STD (Miscellaneous) ×1 IMPLANT
BIT DRILL 3.2 PERIPHERAL SCREW (BIT) ×1 IMPLANT
BLADE EXTENDED COATED 6.5IN (ELECTRODE) IMPLANT
BLADE SAW SAG 73X25 THK (BLADE) ×1
BLADE SAW SGTL 73X25 THK (BLADE) ×1 IMPLANT
BSPLAT GLND STD 25 RVRS SHLDR (Miscellaneous) ×1 IMPLANT
CLSR STERI-STRIP ANTIMIC 1/2X4 (GAUZE/BANDAGES/DRESSINGS) ×2 IMPLANT
COVER SURGICAL LIGHT HANDLE (MISCELLANEOUS) ×2 IMPLANT
COVER WAND RF STERILE (DRAPES) ×1 IMPLANT
DRAPE INCISE IOBAN 66X45 STRL (DRAPES) ×3 IMPLANT
DRAPE ORTHO SPLIT 77X108 STRL (DRAPES) ×4
DRAPE SHEET LG 3/4 BI-LAMINATE (DRAPES) ×2 IMPLANT
DRAPE SURG ORHT 6 SPLT 77X108 (DRAPES) ×2 IMPLANT
DRSG AQUACEL AG ADV 3.5X 6 (GAUZE/BANDAGES/DRESSINGS) ×2 IMPLANT
DURAPREP 26ML APPLICATOR (WOUND CARE) ×4 IMPLANT
ELECT BLADE TIP CTD 4 INCH (ELECTRODE) ×2 IMPLANT
ELECT REM PT RETURN 15FT ADLT (MISCELLANEOUS) ×2 IMPLANT
GLENOSPHERE REV SHOULDER 36 (Joint) ×1 IMPLANT
GLOVE BIO SURGEON STRL SZ8 (GLOVE) ×2 IMPLANT
GLOVE BIOGEL PI IND STRL 8 (GLOVE) ×2 IMPLANT
GLOVE BIOGEL PI INDICATOR 8 (GLOVE) ×2
GLOVE ECLIPSE 8.0 STRL XLNG CF (GLOVE) ×4 IMPLANT
GOWN SPEC L3 XXLG W/TWL (GOWN DISPOSABLE) ×2 IMPLANT
GOWN STRL REUS W/ TWL XL LVL3 (GOWN DISPOSABLE) ×1 IMPLANT
GOWN STRL REUS W/TWL XL LVL3 (GOWN DISPOSABLE) ×2
GUIDEWIRE GLENOID 2.5X220 (WIRE) ×1 IMPLANT
HANDPIECE INTERPULSE COAX TIP (DISPOSABLE) ×2
HEMOSTAT SURGICEL 2X14 (HEMOSTASIS) IMPLANT
IMPL REVERSE SHOULDER 0X3.5 (Shoulder) IMPLANT
IMPLANT REVERSE SHOULDER 0X3.5 (Shoulder) ×2 IMPLANT
INSERT REV KIT SHOULDER 9X36 (Joint) ×1 IMPLANT
KIT BASIN OR (CUSTOM PROCEDURE TRAY) ×2 IMPLANT
KIT STABILIZATION SHOULDER (MISCELLANEOUS) ×2 IMPLANT
KIT TURNOVER KIT A (KITS) IMPLANT
MANIFOLD NEPTUNE II (INSTRUMENTS) ×2 IMPLANT
NDL HYPO 25X1 1.5 SAFETY (NEEDLE) IMPLANT
NDL MAYO CATGUT SZ4 TPR NDL (NEEDLE) IMPLANT
NEEDLE HYPO 25X1 1.5 SAFETY (NEEDLE) IMPLANT
NEEDLE MAYO CATGUT SZ4 (NEEDLE) IMPLANT
NS IRRIG 1000ML POUR BTL (IV SOLUTION) ×2 IMPLANT
PACK SHOULDER (CUSTOM PROCEDURE TRAY) ×2 IMPLANT
RESTRAINT HEAD UNIVERSAL NS (MISCELLANEOUS) ×2 IMPLANT
SCREW 5.0X38 SMALL F/PERFORM (Screw) ×1 IMPLANT
SCREW BONE THREAD 6.5X35 (Screw) ×1 IMPLANT
SCREW PERIPHERAL 30 (Screw) ×1 IMPLANT
SET HNDPC FAN SPRY TIP SCT (DISPOSABLE) ×1 IMPLANT
SLING ARM FOAM STRAP MED (SOFTGOODS) ×1 IMPLANT
SLING ULTRA III MED (ORTHOPEDIC SUPPLIES) ×3 IMPLANT
SPONGE LAP 18X18 X RAY DECT (DISPOSABLE) IMPLANT
STEM HUMERAL AEQUALIS 5BX82 (Stem) ×1 IMPLANT
SUCTION FRAZIER HANDLE 10FR (MISCELLANEOUS) ×1
SUCTION TUBE FRAZIER 10FR DISP (MISCELLANEOUS) ×1 IMPLANT
SUT ETHIBOND 2 V 37 (SUTURE) ×2 IMPLANT
SUT ETHIBOND NAB CT1 #1 30IN (SUTURE) ×2 IMPLANT
SUT FIBERWIRE #5 38 CONV NDL (SUTURE) ×10
SUT MON AB 3-0 SH 27 (SUTURE) ×2
SUT MON AB 3-0 SH27 (SUTURE) ×1 IMPLANT
SUT VIC AB 0 CT1 36 (SUTURE) ×2 IMPLANT
SUTURE FIBERWR #5 38 CONV NDL (SUTURE) ×4 IMPLANT
TOWEL OR 17X26 10 PK STRL BLUE (TOWEL DISPOSABLE) ×2 IMPLANT
WATER STERILE IRR 1000ML POUR (IV SOLUTION) ×4 IMPLANT

## 2018-06-25 NOTE — Op Note (Signed)
Orthopaedic Surgery Operative Note (CSN: [id number])  Gene Hill  1940/10/15 Date of Surgery: 06/25/2018   Diagnoses:  Right shoulder rotator cuff arthropathy  Procedure: Right reverse total Shoulder Arthroplasty   Operative Finding Successful completion of planned procedure.  Good fixation of the glenoid as well as the humerus.  Patient had a 9 mm poly-and had good tension however due to risk complete tear of the supra and infra and minimal subscap tissue we felt he would likely be best served with delayed therapy till after he comes out of the sling.  Reasonable repair of the residual subscap.  Axillary nerve is scarred and are not able to perform a tug test safely and thus stayed away from this area.  Implants: Tornier flex size 5 stem, high offset tray set to 6, 9 mm poly-, 25 baseplate, 35 center screw, 36 glenosphere  Post-operative plan: The patient will be NWB in sling.  The patient will be admitted overnight.  DVT prophylaxis with Xarelto 10 mg a day for the first month while in a sling in the setting of multiple myeloma.  Pain control with PRN pain medication preferring oral medicines.  Follow up plan will be scheduled in approximately 7 days for incision check and XR.  Physical therapy to start at 4 weeks  Post-Op Diagnosis: Same Surgeons:Primary: Hiram Gash, MD Assistants:Brandon Lynnell Jude Location: [facility] Anesthesia: Choice Antibiotics: Ancef 2g preop, Vancomycin 1023m locally Tourniquet time: None Estimated Blood Loss: 1694Complications: None Specimens: None Implants: Implant Name Type Inv. Item Serial No. Manufacturer Lot No. LRB No. Used Action  BASEPLATE GLENOSPHERE 285IOSTD - [id number] BASEPLATE GLENOSPHERE 281WESTD  [employer] [id number] 1 Implanted  GLENOSPHERE REV SHOULDER 36 - [id number] GLENOSPHERE REV SHOULDER 36  [employer] [id number] 1 Implanted  STEM HUMERAL AEQUALIS 5BX82 - [id number] STEM HUMERAL AEQUALIS  5BX82  [employer] [id number] 1 Implanted  INSERT REV KIT SHOULDER 9X36 - [id number] INSERT REV KIT SHOULDER 9X36  [employer] [id number] 1 Implanted  IMPLANT REVERSE SHOULDER 0X3.5 - [id number] IMPLANT REVERSE SHOULDER [id number] 1 Implanted    Indications for Surgery:   Gene DEHOYOSis a 78y.o. male with history of multiple myeloma and failed previous cuff repair performed by another sPsychologist, sport and exercise  He continued pain refractory to nonoperative measures and is very frustrated by her shoulder.  Benefits and risks of operative and nonoperative management were discussed prior to surgery with patient/guardian(s) and informed consent form was completed.  Infection and need for further surgery were discussed as was prosthetic stability and cuff issues.  We additionally specifically discussed risks of axillary nerve injury, infection, periprosthetic fracture, continued pain and longevity of implants prior to beginning procedure.      Procedure:   The patient was identified in the preoperative holding area where the surgical site was marked. Block placed by anesthesia with exparel.  The patient was taken to the OR where a procedural timeout was called and the above noted anesthesia was induced.  The patient was positioned beachchair on allen table with spider arm positioner.  Preoperative antibiotics were dosed.  The patient's right shoulder was prepped and draped in the usual sterile fashion.  A second preoperative timeout was called.      Standard deltopectoral approach was performed with a #10 blade. We dissected down to the subcutaneous tissues and the cephalic vein was taken laterally with the deltoid. Clavipectoral fascia was  incised in line with the incision. Deep retractors were placed. The long of the biceps tendon was identified and there was significant tenosynovitis present.  Tenodesis was performed to the pectoralis tendon with #2 Ethibond. The  remaining biceps was followed up into the rotator interval where it was released.   The subscapularis was completely torn but there was some residual tissue superiorly.  Was very diminutive overall.  We took this down with our capsular release.  We continued releasing the capsule directly off of the osteophytes inferiorly all the way around the corner. This allowed [place] to dislocate the humeral head.   The humeral head had evidence of severe osteoarthritic wear with full-thickness cartilage loss and exposed subchondral bone. There was significant flattening of the humeral head.   The rotator cuff was carefully examined and noted to be irreperably torn.  The decision was confirmed that a reverse total shoulder was indicated for this patient.  There were osteophytes along the inferior humeral neck. The osteophytes were removed with an osteotome and a rongeur.  Osteophytes were removed with a rongeur and an osteotome and the anatomic neck was well visualized.     A humeral cutting guide was inserted down the intramedullary canal. The version was set at 20 of retroversion. Humeral osteotomy was performed with an oscillating saw. The head fragment was passed off the back table. A starter awl was used to open the humeral canal. We next used T-handle straight sound reamers to ream up to an appropriate fit. A chisel was used to remove proximal humeral bone. We then broached starting with a size one broach and broaching up to 5 which obtained an appropriate fit. The broach handle was removed. A cut protector was placed. The broach handle was removed and a cut protector was placed. The humerus was retracted posteriorly and we turned our attention to glenoid exposure.  The subscapularis was again identified and we would normally perform a tug test however scar precluded [place] doing this safely..  We then released the SGHL with bovie cautery prior to placing a curved mayo at the junction of the anterior glenoid well  above the axillary nerve and bluntly dissecting the subscapularis from the capsule.  We did not perform an inferior release of the subscapularis secondary to an inability to safely identify the axillary nerve due to scar.  An anterior deltoid retractor was then placed as well as a small Hohmann retractor superiorly.   The glenoid was inspected and had evidence of severe osteoarthritic wear with full-thickness cartilage loss and exposed subchondral bone. The remaining labrum was removed circumferentially taking great care not to disrupt the posterior capsule.   The glenoid drill guide was placed and used to drill a guide pin in the center, inferior position. The glenoid face was then reamed concentrically over the guide wire. The center hole was drilled over the guidepin in a near anatomic angle of version. Next the  glenoid vault was drilled back to a depth of 35 mm.  We tapped and then placed a 1m size baseplate with 266mlateralization was selected with a 35 mm x 6.5 mm length central screw.  The base plate was screwed into the glenoid vault obtaining secure fixation. We next placed superior and inferior locking screws for additional fixation.  Next a 36 mm glenosphere was selected and impacted onto the baseplate. The center screw was tightened.  We turned attention back to the humeral side. The cut protector was removed. We trialed with multiple size  tray and polyethylene options and selected a 9 which provided good stability and range of motion without excess soft tissue tension. The offset was dialed in to match the normal anatomy. The shoulder was trialed.  There was good ROM in all planes and the shoulder was stable with no inferior translation.  The real humeral implants were opened after again confirming sizes.  The trial was removed. #5 FiberWire sutures passed through the humeral neck for subscap repair. The humeral component was press-fit obtaining a secure fit. A +0 high offset tray was  selected and impacted onto the stem.  A 36+9 polyethylene liner was impacted onto the stem.  The joint was reduced and thoroughly irrigated with pulsatile lavage. Subscap was repaired back with #5 FiberWire sutures through bone tunnels. Hemostasis was obtained. The deltopectoral interval was reapproximated with #1 Ethibond. The subcutaneous tissues were closed with 2-0 Vicryl and the skin was closed with running monocryl.    The wounds were cleaned and dried and an Aquacel dressing was placed. The drapes taken down. The arm was placed into sling with abduction pillow. Patient was awakened, extubated, and transferred to the recovery room in stable condition. There were no intraoperative complications. The sponge, needle, and attention counts were correct at the end of the case.   Joya Gaskins, OPA-C, present and scrubbed throughout the case, critical for completion in a timely fashion, and for retraction, instrumentation, closure.

## 2018-06-25 NOTE — Discharge Instructions (Signed)

## 2018-06-25 NOTE — H&P (Signed)
PREOPERATIVE H&P  Chief Complaint: djd right shoulder  HPI: Gene Hill is a 78 y.o. male who presents for preoperative history and physical with a diagnosis of djd right shoulder. Symptoms are rated as moderate to severe, and have been worsening.  This is significantly impairing activities of daily living.  Please see my clinic note for full details on this patient's care.  He has elected for surgical management.   Past Medical History:  Diagnosis Date  . Arthritis   . 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 Blood dyscrasia    low WBC d/t multiple myeloma  . Bulging lumbar disc    L2-3  . Carotid bruit    3-11 carotid u/s was  (-)  . 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 GERD (gastroesophageal reflux disease)   . Headache   . Heart murmur   . HYPERLIPIDEMIA 05/03/2006  . HYPERTENSION 05/03/2006  . 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 Torn rotator cuff 01/22/2017   left- [place]   Past Surgical History:  Procedure Laterality Date  . CERVICAL FUSION    . IR FLUORO GUIDE PORT INSERTION RIGHT  11/29/2016  . IR US GUIDE VASC ACCESS RIGHT  11/29/2016  . [place]   right  . ROTATOR CUFF REPAIR     right  . SPINAL FUSION     lumbar  . TOTAL KNEE ARTHROPLASTY Right 12/23/2015   Procedure: TOTAL KNEE ARTHROPLASTY;  Surgeon: Earlie Server, MD;  Location: [facility];  Service: Orthopedics;  Laterality: Right;   Social History   Socioeconomic History  . Marital status: Married    Spouse name: Not on file  . Number of children: 4  . Years of education: Not on file  . Highest education level: Not on file  Occupational History  . Occupation: retired  Scientific laboratory technician  . Financial resource strain: Not on file  . Food insecurity:    Worry: Not on file    Inability: Not on file  .  Transportation needs:    Medical: Not on file    Non-medical: Not on file  Tobacco Use  . Smoking status: Former Smoker    Packs/day: 0.25    Years: 20.00    Pack years: 5.00    Types: Cigarettes    Start date: 04/28/1954    Last attempt to quit: 11/28/1974    Years since quitting: 43.6  . Smokeless tobacco: Never Used  . Tobacco comment: quit 35 years ago, 1976  Substance and Sexual Activity  . Alcohol use: Yes    Alcohol/week: 0.0 standard drinks    Comment: occ  . Drug use: No  . Sexual activity: Never  Lifestyle  . Physical activity:    Days per week: Not on file    Minutes per session: Not on file  . Stress: Not on file  Relationships  . Social connections:    Talks on phone: Not on file    Gets together: Not on file    Attends religious service: Not on file    Active member of club or organization: Not on file    Attends meetings of clubs or organizations: Not on file    Relationship status: Not on file  Other Topics Concern  . Not on file  Social History Narrative   Household-- pt, wife, son   Family History  Problem Relation Age of Onset  . Kidney cancer Father   . Leukemia Mother   . Pulmonary embolism Brother   . Stroke Maternal Grandmother   . Heart attack Maternal Grandfather   . Stroke Paternal Grandmother   . Cerebral palsy Son   . Prostate cancer Neg Hx   . Colon cancer Neg Hx   . Esophageal cancer Neg Hx   . Rectal cancer Neg Hx   . Stomach cancer Neg Hx    No Known Allergies Prior to Admission medications   Medication Sig Start Date End Date Taking? Authorizing Provider  acetaminophen (TYLENOL) 325 MG tablet Take 650 mg by mouth every 6 (six) hours as needed for moderate pain.    Yes [provider]  aspirin 325 MG tablet Take 325 mg by mouth daily.   Yes [provider]  atorvastatin (LIPITOR) 10 MG tablet Take 1 tablet (10 mg total) by mouth at bedtime. 05/16/18  Yes Paz, Alda Berthold, MD  calcium carbonate (OSCAL) 1500 (600 Ca) MG  TABS tablet Take 600 mg of elemental calcium by mouth daily with breakfast.    Yes [provider]  Cyanocobalamin (VITAMIN B-12 PO) Take 5,000 mcg by mouth daily.    Yes [provider]  Daratumumab (DARZALEX IV) Inject into the vein every 6 (six) weeks.    Yes [provider]  dexamethasone (DECADRON) 2 MG tablet TAKE 2 TABLETS BY MOUTH DAY AFTER CHEMO THEN 1 TABLET EVERY DAY FOR 4 DAYS Patient taking differently: Take 2-4 mg by mouth See admin instructions. TAKE 8 MG BY MOUTH DAY AFTER CHEMO THEN 2 MG EVERY DAY FOR 4 DAYS 10/30/17  Yes [name], MD  fluticasone (FLONASE) 50 MCG/ACT nasal spray Place 1 spray into both nostrils at bedtime.    Yes [provider]  ibuprofen (ADVIL,MOTRIN) 200 MG tablet Take 400 mg by mouth every 6 (six) hours as needed for moderate pain.    Yes [provider]  metoprolol tartrate (LOPRESSOR) 100 MG tablet Take 0.5 tablets (50 mg total) by mouth 2 (two) times daily. 03/25/18  Yes Paz, Alda Berthold, MD  Multiple Vitamin (MULTIVITAMIN) tablet Take 1 tablet by mouth daily.   Yes [provider]  Omega-3 Fatty Acids (FISH OIL) 1200 MG CAPS Take 1,200 mg by mouth daily.   Yes [provider]  ondansetron (ZOFRAN) 4 MG tablet TAKE 1 TABLET BY MOUTH 1 HOUR BEFORE NINLARO, THEN EVERY 8 HOURS AS NEEDED FOR NAUSEA Patient taking differently: Take 4 mg by mouth every 8 (eight) hours as needed for nausea or vomiting. TAKE 1 TABLET BY MOUTH 1 HOUR BEFORE NINLARO, THEN EVERY 8 HOURS AS NEEDED FOR NAUSEA 05/17/17  Yes Ennever, Rudell Cobb, MD  oxyCODONE (OXY IR/ROXICODONE) 5 MG immediate release tablet Take 1 tablet (5 mg total) by mouth every 6 (six) hours as needed for severe pain. 05/26/18  Yes [name], MD  pantoprazole (PROTONIX) 40 MG tablet Take 1 tablet (40 mg total) by mouth daily. Patient taking differently: Take 40 mg by mouth at bedtime.  09/25/17  Yes Paz, Alda Berthold, MD  Probiotic Product (PROBIOTIC PO) Take 1  tablet by mouth daily.    Yes [provider]  lidocaine (XYLOCAINE) 5 % ointment Apply 1 application topically 2 (two) times daily as needed. Mix w/ OTC Hydrocortisone 1 % 12/14/15   Colon Branch, MD  methocarbamol (ROBAXIN)  500 MG tablet Take 1 tablet (500 mg total) by mouth 3 (three) times daily as needed for muscle spasms. 06/18/18   Colon Branch, MD     Positive ROS: All other systems have been reviewed and were otherwise negative with the exception of those mentioned in the HPI and as above.  Physical Exam: General: Alert, no acute distress Cardiovascular: No pedal edema Respiratory: No cyanosis, no use of accessory musculature GI: No organomegaly, abdomen is soft and non-tender Skin: No lesions in the area of chief complaint Neurologic: Sensation intact distally Psychiatric: Patient is competent for consent with normal mood and affect Lymphatic: No axillary or cervical lymphadenopathy  MUSCULOSKELETAL: R shoulder: previous superior incision noted, wwp hand, block in place.  Assessment: djd right shoulder  Plan: Plan for Procedure(s): REVERSE SHOULDER ARTHROPLASTY  The risks benefits and alternatives were discussed with the patient including but not limited to the risks of nonoperative treatment, versus surgical intervention including infection, bleeding, nerve injury,  blood clots, cardiopulmonary complications, morbidity, mortality, among others, and they were willing to proceed.   Hiram Gash, MD  06/25/2018 8:20 AM

## 2018-06-25 NOTE — Anesthesia Procedure Notes (Signed)
Procedure Name: Intubation Date/Time: 06/25/2018 8:41 AM Performed by: Lissa Morales, CRNA Pre-anesthesia Checklist: Patient identified, Emergency Drugs available, Suction available and Patient being monitored Patient Re-evaluated:Patient Re-evaluated prior to induction Oxygen Delivery Method: Circle system utilized Preoxygenation: Pre-oxygenation with 100% oxygen Induction Type: IV induction Ventilation: Mask ventilation without difficulty Laryngoscope Size: Mac and 4 Grade View: Grade II Tube type: Oral Tube size: 7.5 mm Number of attempts: 1 Airway Equipment and Method: Stylet and Oral airway Placement Confirmation: ETT inserted through vocal cords under direct vision,  positive ETCO2 and breath sounds checked- equal and bilateral Secured at: 21 cm Tube secured with: Tape Dental Injury: Teeth and Oropharynx as per pre-operative assessment

## 2018-06-25 NOTE — Transfer of Care (Signed)
Immediate Anesthesia Transfer of Care Note  Patient: Gene Hill  Procedure(s) Performed: REVERSE SHOULDER ARTHROPLASTY (Right )  Patient Location: PACU  Anesthesia Type:General  Level of Consciousness: awake, alert , oriented and patient cooperative  Airway & Oxygen Therapy: Patient Spontanous Breathing and Patient connected to face mask oxygen  Post-op Assessment: Report given to RN, Post -op Vital signs reviewed and stable and Patient moving all extremities X 4  Post vital signs: stable  Last Vitals:  Vitals Value Taken Time  BP 149/84 06/25/2018 10:30 AM  Temp 36.7 C 06/25/2018 10:30 AM  Pulse 73 06/25/2018 10:35 AM  Resp 22 06/25/2018 10:35 AM  SpO2 100 % 06/25/2018 10:35 AM  Vitals shown include unvalidated device data.  Last Pain:  Vitals:   06/25/18 0659  TempSrc: Oral  PainSc:       Patients Stated Pain Goal: 4 (27/67/01 1003)  Complications: No apparent anesthesia complications

## 2018-06-25 NOTE — Anesthesia Procedure Notes (Addendum)
Anesthesia Regional Block: Supraclavicular block   Pre-Anesthetic Checklist: ,, timeout performed, Correct Patient, Correct Site, Correct Laterality, Correct Procedure, Correct Position, site marked, Risks and benefits discussed,  Surgical consent,  Pre-op evaluation,  At surgeon's request and post-op pain management  Laterality: Right and Upper  Prep: Maximum Sterile Barrier Precautions used, chloraprep       Needles:  Injection technique: Single-shot  Needle Type: Echogenic Stimulator Needle     Needle Length: 10cm      Additional Needles:   Procedures:,,,, ultrasound used (permanent image in chart),,,,  Narrative:  Start time: 06/25/2018 8:00 AM End time: 06/25/2018 8:13 AM Injection made incrementally with aspirations every 5 mL.  Performed by: Personally  Anesthesiologist: Montez Hageman, MD  Additional Notes: Risks, benefits and alternative to block explained extensively.  Patient tolerated procedure well, without complications.

## 2018-06-26 ENCOUNTER — Encounter (HOSPITAL_COMMUNITY): Payer: Self-pay | Admitting: Orthopaedic Surgery

## 2018-06-26 NOTE — Anesthesia Postprocedure Evaluation (Signed)
Anesthesia Post Note  Patient: Gene Hill  Procedure(s) Performed: REVERSE SHOULDER ARTHROPLASTY (Right )     Patient location during evaluation: PACU Anesthesia Type: General Level of consciousness: awake and alert Pain management: pain level controlled Vital Signs Assessment: post-procedure vital signs reviewed and stable Respiratory status: spontaneous breathing, nonlabored ventilation, respiratory function stable and patient connected to nasal cannula oxygen Cardiovascular status: blood pressure returned to baseline and stable Postop Assessment: no apparent nausea or vomiting Anesthetic complications: no    Last Vitals:  Vitals:   06/26/18 0134 06/26/18 0501  BP: 114/70 135/75  Pulse: 76 75  Resp: 18 18  Temp: 36.6 C 36.5 C  SpO2: 98% 98%    Last Pain:  Vitals:   06/26/18 0501  TempSrc: Oral  PainSc:    Pain Goal: Patients Stated Pain Goal: 4 (06/25/18 0652)                 Montez Hageman

## 2018-06-26 NOTE — Discharge Summary (Signed)
Patient ID: Gene Hill MRN: [id number] DOB/AGE: 07-30-1940 78 y.o.  Admit date: 06/25/2018 Discharge date: 06/26/2018  Admission Diagnoses:R irreparable cuff tear  Discharge Diagnoses:  Active Problems:   Rotator cuff tear arthropathy, right   Past Medical History:  Diagnosis Date  . Arthritis   . 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 Blood dyscrasia    low WBC d/t multiple myeloma  . Bulging lumbar disc    L2-3  . Carotid bruit    3-11 carotid u/s was  (-)  . 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 GERD (gastroesophageal reflux disease)   . Headache   . Heart murmur   . HYPERLIPIDEMIA 05/03/2006  . HYPERTENSION 05/03/2006  . 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 Torn rotator cuff 01/22/2017   left- [place]     Procedures Performed: R reverse total shoulder arthroplasty  Discharged Condition: good  Hospital Course: Patient brought in as an outpatient for surgery.  Tolerated procedure well.  Was kept for monitoring overnight for pain control and medical monitoring postop and was found to be stable for DC home the morning after surgery.  Patient was instructed on specific activity restrictions and all questions were answered.   Consults: None  Significant Diagnostic Studies: No additional pertinent studies  Treatments: Surgery  Discharge Exam:  Dressing CDI and sling well fitting,  full and painless ROM throughout hand with DPC of 0.  Axillary nerve sensation/motor altered in setting of block and unable to be fully tested.  Distal motor and sensory altered in setting of block.  Disposition:   Discharge Instructions    Call MD for:  persistant nausea and vomiting   Complete by:  As directed    Call MD for:  redness, tenderness, or signs of infection (pain, swelling,  redness, odor or green/yellow discharge around incision site)   Complete by:  As directed    Call MD for:  severe uncontrolled pain   Complete by:  As directed    Diet - low sodium heart healthy   Complete by:  As directed    Discharge instructions   Complete by:  As directed    Ophelia Charter MD, MPH [place]. [address] [phone] (tel)   [phone] (fax)   [place] may leave the operative dressing in place until your follow-up appointment. KEEP THE INCISIONS CLEAN AND DRY. Use the Cryocuff, GameReady or Ice as often as possible for the first 3-4 days, then as needed for pain relief.  You may shower on Post-Op Day #2. The dressing is water resistant but do not scrub it as it may start to peel up.  You may remove the sling for showering, but keep a water resistant pillow under the arm to keep both the elbow and shoulder away from the body (mimicking the abduction sling). Gently pat the area dry. Do not soak the shoulder in water. Do not go swimming in the pool or ocean until your sutures are removed.  EXERCISES Wear the sling at all times except when doing your exercises. You may remove the sling for showering, but keep the arm across the chest or in a secondary sling.   Accidental/Purposeful External Rotation  and shoulder flexion (reaching behind you) is to be avoided at all costs for the first month. Please perform the exercises:   Elbow / Hand / Wrist  Range of Motion Exercises  FOLLOW-UP If you develop a Fever (>101.5), Redness or Drainage from the surgical incision site, please call our office to arrange for an evaluation. Please call the office to schedule a follow-up appointment for a wound check, 7-10 days post-operatively.    IF YOU HAVE ANY QUESTIONS, PLEASE FEEL FREE TO CALL OUR OFFICE.   HELPFUL INFORMATION  Your arm will be in a sling following surgery. You  will be in this sling for the next 3-4 weeks.  I will let you know the exact duration at your follow-up visit.  You may be more comfortable sleeping in a semi-seated position the first few nights following surgery.  Keep a pillow propped under the elbow and forearm for comfort.  If you have a recliner type of chair it might be beneficial.  If not that is fine too, but it would be helpful to sleep propped up with pillows behind your operated shoulder as well under your elbow and forearm.  This will reduce pulling on the suture lines.  We suggest you use the pain medication the first night prior to going to bed, in order to ease any pain when the anesthesia wears off. You should avoid taking pain medications on an empty stomach as it will make you nauseous.  Do not drink alcoholic beverages or take illicit drugs when taking pain medications.  In most states it is against the law to drive while your arm is in a sling. And certainly against the law to drive while taking narcotics.  You may return to work/school in the next couple of days when you feel up to it. Desk work and typing in the sling is fine.  When dressing, put your operative arm in the sleeve first.  When getting undressed, take your operative arm out last.  Loose fitting, button-down shirts are recommended.  Pain medication may make you constipated.  Below are a few solutions to try in this order: Decrease the amount of pain medication if you aren't having pain. Drink lots of decaffeinated fluids. Drink prune juice and/or each dried prunes  If the first 3 don't work start with additional solutions Take Colace - an over-the-counter stool softener Take Senokot - an over-the-counter laxative Take Miralax - a stronger over-the-counter laxative   Increase activity slowly   Complete by:  As directed      Allergies as of 06/26/2018   No Known Allergies     Medication List    STOP taking these medications   aspirin 325 MG tablet    ibuprofen 200 MG tablet Commonly known as:  ADVIL     TAKE these medications   acetaminophen 500 MG tablet Commonly known as:  TYLENOL Take 2 tablets (1,000 mg total) by mouth every 8 (eight) hours for 14 days. What changed:    medication strength  how much to take  when to take this  reasons to take this   atorvastatin 10 MG tablet Commonly known as:  LIPITOR Take 1 tablet (10 mg total) by mouth at bedtime.   calcium carbonate 1500 (600 Ca) MG Tabs tablet Commonly known as:  OSCAL Take 600 mg of elemental calcium by mouth daily with breakfast.   celecoxib 100 MG capsule Commonly known as:  CeleBREX Take 1 capsule (100 mg total) by mouth 2 (two)  times daily.   DARZALEX IV Inject into the vein every 6 (six) weeks.   dexamethasone 2 MG tablet Commonly known as:  DECADRON TAKE 2 TABLETS BY MOUTH DAY AFTER CHEMO THEN 1 TABLET EVERY DAY FOR 4 DAYS What changed:    how much to take  how to take this  when to take this  additional instructions   Fish Oil 1200 MG Caps Take 1,200 mg by mouth daily.   fluticasone 50 MCG/ACT nasal spray Commonly known as:  FLONASE Place 1 spray into both nostrils at bedtime.   lidocaine 5 % ointment Commonly known as:  XYLOCAINE Apply 1 application topically 2 (two) times daily as needed. Mix w/ OTC Hydrocortisone 1 %   methocarbamol 500 MG tablet Commonly known as:  ROBAXIN Take 1 tablet (500 mg total) by mouth 3 (three) times daily as needed for muscle spasms.   metoprolol tartrate 100 MG tablet Commonly known as:  LOPRESSOR Take 0.5 tablets (50 mg total) by mouth 2 (two) times daily.   multivitamin tablet Take 1 tablet by mouth daily.   ondansetron 4 MG tablet Commonly known as:  Zofran Take 1 tablet (4 mg total) by mouth every 8 (eight) hours as needed for up to 7 days for nausea or vomiting. What changed:  See the new instructions.   oxyCODONE 5 MG immediate release tablet Commonly known as:  Oxy IR/ROXICODONE  Take 1-2 pills every 6 hrs as needed for pain, no more than 6 per day What changed:    how much to take  how to take this  when to take this  reasons to take this  additional instructions   pantoprazole 40 MG tablet Commonly known as:  PROTONIX Take 1 tablet (40 mg total) by mouth daily. What changed:  when to take this   PROBIOTIC PO Take 1 tablet by mouth daily.   rivaroxaban 10 MG Tabs tablet Commonly known as:  Xarelto Take 1 tablet (10 mg total) by mouth daily for 30 days.   VITAMIN B-12 PO Take 5,000 mcg by mouth daily.

## 2018-06-26 NOTE — Evaluation (Signed)
Occupational Therapy Evaluation Patient Details Name: Gene Hill MRN: [id number] DOB: [birth year] Today's Date: 06/26/2018    History of Present Illness s/p R TSA.  H/O MM, on chemo   Clinical Impression   This 78 year old man was admitted for the above.  All education was completed. No further OT is needed at this time     Follow Up Recommendations  Follow surgeon's recommendation for DC plan and follow-up therapies    Equipment Recommendations  None recommended by OT    Recommendations for Other Services       Precautions / Restrictions Precautions Precautions: Shoulder Type of Shoulder Precautions: slnig on except for bathing/dressing.  Elbow to finger ROM allowed; no shoulder ROM Shoulder Interventions: Shoulder abduction pillow Precaution Booklet Issued: Yes (comment) Restrictions Weight Bearing Restrictions: Yes RUE Weight Bearing: Non weight bearing      Mobility Bed Mobility Overal bed mobility: Independent                Transfers Overall transfer level: Independent                    Balance                                           ADL either performed or assessed with clinical judgement   ADL Overall ADL's : Needs assistance/impaired Eating/Feeding: Set up   Grooming: Set up   Upper Body Bathing: Moderate assistance   Lower Body Bathing: Minimal assistance   Upper Body Dressing : Moderate assistance   Lower Body Dressing: Minimal assistance;Moderate assistance                 General ADL Comments: performed ADL from EOB and completed education.  Worked through elbow to finger ROM and educated on sling. Pt has had a similar sling in the past, and feels wife will not have any difficulty with donning/doffing this. Pt verbalizes understanding of all education     Vision         Perception     Praxis      Pertinent Vitals/Pain Pain Assessment: No/denies pain(block in effect)     Hand Dominance      Extremity/Trunk Assessment Upper Extremity Assessment Upper Extremity Assessment: RUE deficits/detail(immobilized; can move fingers through elbow)           Communication Communication Communication: No difficulties   Cognition Arousal/Alertness: Awake/alert Behavior During Therapy: WFL for tasks assessed/performed Overall Cognitive Status: Within Functional Limits for tasks assessed                                     General Comments       Exercises     Shoulder Instructions      Home Living Family/patient expects to be discharged to:: Private residence Living Arrangements: Spouse/significant other Available Help at Discharge: Family               Bathroom Shower/Tub: Teacher, early years/pre: Standard     Home Equipment: Bedside commode          Prior Functioning/Environment Level of Independence: Independent                 OT Problem List:        OT Treatment/Interventions:  OT Goals(Current goals can be found in the care plan section) Acute Rehab OT Goals Patient Stated Goal: home today; return to independence OT Goal Formulation: All assessment and education complete, DC therapy  OT Frequency:     Barriers to D/C:            Co-evaluation              AM-PAC OT "6 Clicks" Daily Activity     Outcome Measure Help from another person eating meals?: A Little Help from another person taking care of personal grooming?: A Little Help from another person toileting, which includes using toliet, bedpan, or urinal?: A Little Help from another person bathing (including washing, rinsing, drying)?: A Lot Help from another person to put on and taking off regular upper body clothing?: A Lot Help from another person to put on and taking off regular lower body clothing?: A Little 6 Click Score: 16   End of Session    Activity Tolerance: Patient tolerated treatment well Patient left: in bed;with call  bell/phone within reach  OT Visit Diagnosis: Muscle weakness (generalized) (M62.81)                Time: 7282-0601 OT Time Calculation (min): 31 min Charges:  OT General Charges $OT Visit: 1 Visit OT Evaluation $OT Eval Low Complexity: 1 Low OT Treatments $Self Care/Home Management : 8-22 mins  Lesle Chris, OTR/L Acute Rehabilitation Services [phone] [facility] pager [phone] office 06/26/2018  [place] 06/26/2018, 11:42 AM

## 2018-06-26 NOTE — Plan of Care (Signed)
  Problem: Health Behavior/Discharge Planning: Goal: Ability to manage health-related needs will improve Outcome: Progressing   Problem: Activity: Goal: Risk for activity intolerance will decrease Outcome: Progressing   Problem: Pain Management: Goal: Pain level will decrease with appropriate interventions Outcome: Progressing

## 2018-06-26 NOTE — Progress Notes (Signed)
Patient discharged to home w/ wife. Given all belongings, instructions, equipment. All questions answered. Escorted to pov via w/c.

## 2018-07-04 DIAGNOSIS — M19011 Primary osteoarthritis, right shoulder: Secondary | ICD-10-CM | POA: Diagnosis not present

## 2018-07-07 ENCOUNTER — Telehealth: Payer: Self-pay | Admitting: Hematology & Oncology

## 2018-07-07 ENCOUNTER — Inpatient Hospital Stay: Payer: PPO

## 2018-07-07 ENCOUNTER — Inpatient Hospital Stay: Payer: PPO | Attending: Hematology & Oncology | Admitting: Hematology & Oncology

## 2018-07-07 ENCOUNTER — Encounter: Payer: Self-pay | Admitting: Hematology & Oncology

## 2018-07-07 ENCOUNTER — Other Ambulatory Visit: Payer: Self-pay

## 2018-07-07 VITALS — BP 140/82 | HR 70 | Resp 17

## 2018-07-07 VITALS — BP 165/88 | HR 71 | Temp 98.8°F | Resp 17 | Wt 212.0 lb

## 2018-07-07 DIAGNOSIS — C9000 Multiple myeloma not having achieved remission: Secondary | ICD-10-CM

## 2018-07-07 DIAGNOSIS — Z5112 Encounter for antineoplastic immunotherapy: Secondary | ICD-10-CM | POA: Diagnosis not present

## 2018-07-07 DIAGNOSIS — C9002 Multiple myeloma in relapse: Secondary | ICD-10-CM | POA: Diagnosis not present

## 2018-07-07 LAB — CBC WITH DIFFERENTIAL (CANCER CENTER ONLY)
Abs Immature Granulocytes: 0.02 10*3/uL (ref 0.00–0.07)
Basophils Absolute: 0 10*3/uL (ref 0.0–0.1)
Basophils Relative: 0 %
Eosinophils Absolute: 0 10*3/uL (ref 0.0–0.5)
Eosinophils Relative: 1 %
HCT: 33.9 % — ABNORMAL LOW (ref 39.0–52.0)
Hemoglobin: 10.9 g/dL — ABNORMAL LOW (ref 13.0–17.0)
Immature Granulocytes: 0 %
Lymphocytes Relative: 30 %
Lymphs Abs: 1.5 10*3/uL (ref 0.7–4.0)
MCH: 33.6 pg (ref 26.0–34.0)
MCHC: 32.2 g/dL (ref 30.0–36.0)
MCV: 104.6 fL — ABNORMAL HIGH (ref 80.0–100.0)
Monocytes Absolute: 0.6 10*3/uL (ref 0.1–1.0)
Monocytes Relative: 11 %
Neutro Abs: 3 10*3/uL (ref 1.7–7.7)
Neutrophils Relative %: 58 %
Platelet Count: 207 10*3/uL (ref 150–400)
RBC: 3.24 MIL/uL — ABNORMAL LOW (ref 4.22–5.81)
RDW: 13.9 % (ref 11.5–15.5)
WBC Count: 5.2 10*3/uL (ref 4.0–10.5)
nRBC: 0 % (ref 0.0–0.2)

## 2018-07-07 LAB — CMP (CANCER CENTER ONLY)
ALT: 24 U/L (ref 0–44)
AST: 17 U/L (ref 15–41)
Albumin: 4 g/dL (ref 3.5–5.0)
Alkaline Phosphatase: 40 U/L (ref 38–126)
Anion gap: 9 (ref 5–15)
BUN: 21 mg/dL (ref 8–23)
CO2: 27 mmol/L (ref 22–32)
Calcium: 9.8 mg/dL (ref 8.9–10.3)
Chloride: 103 mmol/L (ref 98–111)
Creatinine: 1.01 mg/dL (ref 0.61–1.24)
GFR, Est AFR Am: >60 mL/min
GFR, Estimated: >60 mL/min
Glucose, Bld: 100 mg/dL — ABNORMAL HIGH (ref 70–99)
Potassium: 4.4 mmol/L (ref 3.5–5.1)
Sodium: 139 mmol/L (ref 135–145)
Total Bilirubin: 0.3 mg/dL (ref 0.3–1.2)
Total Protein: 6.6 g/dL (ref 6.5–8.1)

## 2018-07-07 MED ORDER — PROCHLORPERAZINE MALEATE 10 MG PO TABS
10.0000 mg | ORAL_TABLET | Freq: Once | ORAL | Status: AC
  Administered 2018-07-07: 10 mg via ORAL

## 2018-07-07 MED ORDER — METHYLPREDNISOLONE SODIUM SUCC 125 MG IJ SOLR
INTRAMUSCULAR | Status: AC
  Filled 2018-07-07: qty 2

## 2018-07-07 MED ORDER — DIPHENHYDRAMINE HCL 25 MG PO CAPS
50.0000 mg | ORAL_CAPSULE | Freq: Once | ORAL | Status: AC
  Administered 2018-07-07: 50 mg via ORAL

## 2018-07-07 MED ORDER — ACETAMINOPHEN 325 MG PO TABS
650.0000 mg | ORAL_TABLET | Freq: Once | ORAL | Status: AC
  Administered 2018-07-07: 650 mg via ORAL

## 2018-07-07 MED ORDER — HEPARIN SOD (PORK) LOCK FLUSH 100 UNIT/ML IV SOLN
500.0000 [IU] | Freq: Once | INTRAVENOUS | Status: AC | PRN
  Administered 2018-07-07: 14:00:00 500 [IU]
  Filled 2018-07-07: qty 5

## 2018-07-07 MED ORDER — SODIUM CHLORIDE 0.9 % IV SOLN
15.7000 mg/kg | Freq: Once | INTRAVENOUS | Status: AC
  Administered 2018-07-07: 1600 mg via INTRAVENOUS
  Filled 2018-07-07: qty 80

## 2018-07-07 MED ORDER — PROCHLORPERAZINE MALEATE 10 MG PO TABS
ORAL_TABLET | ORAL | Status: AC
  Filled 2018-07-07: qty 1

## 2018-07-07 MED ORDER — METHYLPREDNISOLONE SODIUM SUCC 125 MG IJ SOLR
125.0000 mg | Freq: Once | INTRAMUSCULAR | Status: AC
  Administered 2018-07-07: 125 mg via INTRAVENOUS

## 2018-07-07 MED ORDER — DIPHENHYDRAMINE HCL 25 MG PO CAPS
ORAL_CAPSULE | ORAL | Status: AC
  Filled 2018-07-07: qty 2

## 2018-07-07 MED ORDER — ACETAMINOPHEN 325 MG PO TABS
ORAL_TABLET | ORAL | Status: AC
  Filled 2018-07-07: qty 2

## 2018-07-07 MED ORDER — FUSION PLUS PO CAPS: 1.0000 | ORAL_CAPSULE | Freq: Every day | ORAL | 4 refills | Status: DC

## 2018-07-07 MED ORDER — SODIUM CHLORIDE 0.9% FLUSH
10.0000 mL | INTRAVENOUS | Status: DC | PRN
  Administered 2018-07-07: 14:00:00 10 mL
  Filled 2018-07-07: qty 10

## 2018-07-07 MED ORDER — SODIUM CHLORIDE 0.9 % IV SOLN
Freq: Once | INTRAVENOUS | Status: AC
  Administered 2018-07-07: 11:00:00 via INTRAVENOUS
  Filled 2018-07-07: qty 250

## 2018-07-07 NOTE — Progress Notes (Signed)
Hematology and Oncology Follow Up Visit  Gene Hill [id number] [birth year] 77 y.o. 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  He had surgery on his right shoulder couple weeks ago.  He had a total shoulder replacement.  He is in a "super sling" and will need to be in this for for 5 to 6 weeks.  It does not sound like that the surgery went well.  He is doing great.  His myeloma has been holding steady.  Last, [name] was still 0.4 g/dL.  His IgA level was 928 mg/dL.  His kappa light chain was 2.2 mg/dL.  He is slightly gardening.  He probably had some garlic today.  Unfortunately, all the rain we had has adversely affected his garden.  He has had no fever.  He has had no rashes.  His back seems to be holding up right now.  There is no change in bowel or bladder habits.  Overall, his performance status is ECOG 1.     Medications:  Allergies as of 07/07/2018   No Known Allergies     Medication List       Accurate as of July 07, 2018 10:40 AM. If you have any questions, ask your nurse or doctor.        acetaminophen 500 MG tablet Commonly known as: TYLENOL Take 2 tablets (1,000 mg total) by mouth every 8 (eight) hours for 14 days.   atorvastatin 10 MG tablet Commonly known as: LIPITOR Take 1 tablet (10 mg total) by mouth at bedtime.   calcium carbonate 1500 (600 Ca) MG Tabs tablet Commonly known as: OSCAL Take 600 mg of elemental calcium by mouth daily with breakfast.   celecoxib 100 MG capsule Commonly known as: CeleBREX Take 1 capsule (100 mg total) by mouth 2 (two) times daily.   DARZALEX IV Inject into the vein every 6 (six) weeks.   dexamethasone 2 MG  tablet Commonly known as: DECADRON TAKE 2 TABLETS BY MOUTH DAY AFTER CHEMO THEN 1 TABLET EVERY DAY FOR 4 DAYS What changed:   how much to take  how to take this  when to take this  additional instructions   Fish Oil 1200 MG Caps Take 1,200 mg by mouth daily.   fluticasone 50 MCG/ACT nasal spray Commonly known as: FLONASE Place 1 spray into both nostrils at bedtime.   lidocaine 5 % ointment Commonly known as: XYLOCAINE Apply 1 application topically 2 (two) times daily as needed. Mix w/ OTC Hydrocortisone 1 %   methocarbamol 500 MG tablet Commonly known as: ROBAXIN Take 1 tablet (500 mg total) by mouth 3 (three) times daily as needed for muscle spasms.   metoprolol tartrate 100 MG tablet Commonly known as: LOPRESSOR Take 0.5 tablets (50 mg total) by mouth 2 (two) times daily.   multivitamin tablet Take 1 tablet by mouth daily.   pantoprazole 40 MG tablet Commonly known as: PROTONIX Take 1 tablet (40 mg total) by mouth daily. What changed: when to take this   PROBIOTIC PO Take 1 tablet by mouth daily.   rivaroxaban 10 MG Tabs tablet Commonly  known as: Xarelto Take 1 tablet (10 mg total) by mouth daily for 30 days.   VITAMIN B-12 PO Take 5,000 mcg by mouth daily.       Allergies: No Known Allergies  Past Medical History, Surgical history, Social history, and Family History were reviewed and updated.  Review of Systems: Review of Systems  Constitutional: Negative.   HENT: Negative.   Eyes: Negative.   Respiratory: Negative.   Cardiovascular: Negative.   Gastrointestinal: Negative.   Genitourinary: Negative.   Musculoskeletal: Positive for back pain and joint pain.  Skin: Negative.   Neurological: Negative.   Endo/Heme/Allergies: Negative.   Psychiatric/Behavioral: Negative.       Physical Exam:  weight is 212 lb (96.2 kg). His oral temperature is 98.8 F (37.1 C). His blood pressure is 165/88 (abnormal) and his pulse is 71. His respiration is 17  and oxygen saturation is 97%.   Wt Readings from Last 3 Encounters:  07/07/18 212 lb (96.2 kg)  07/07/18 212 lb 1.3 oz (96.2 kg)  06/25/18 212 lb 11.2 oz (96.5 kg)  Is  Physical Exam Vitals signs reviewed.  HENT:     Head: Normocephalic and atraumatic.  Eyes:     Pupils: Pupils are equal, round, and reactive to light.  Neck:     Musculoskeletal: Normal range of motion.  Cardiovascular:     Rate and Rhythm: Normal rate and regular rhythm.     Heart sounds: Normal heart sounds.  Pulmonary:     Effort: Pulmonary effort is normal.     Breath sounds: Normal breath sounds.  Abdominal:     General: Bowel sounds are normal.     Palpations: Abdomen is soft.  Musculoskeletal: Normal range of motion.        General: No tenderness or deformity.  Lymphadenopathy:     Cervical: No cervical adenopathy.  Skin:    General: Skin is warm and dry.     Findings: No erythema or rash.  Neurological:     Mental Status: He is alert and oriented to person, place, and time.  Psychiatric:        Behavior: Behavior normal.        Thought Content: Thought content normal.        Judgment: Judgment normal.      Lab Results  Component Value Date   WBC 5.2 07/07/2018   HGB 10.9 (L) 07/07/2018   HCT 33.9 (L) 07/07/2018   MCV 104.6 (H) 07/07/2018   PLT 207 07/07/2018   Lab Results  Component Value Date   FERRITIN 351 (H) 06/26/2011   IRON 75 06/26/2011   TIBC 353 06/26/2011   UIBC 278 06/26/2011   IRONPCTSAT 21 06/26/2011   Lab Results  Component Value Date   RBC 3.24 (L) 07/07/2018   Lab Results  Component Value Date   KPAFRELGTCHN 26.9 (H) 05/26/2018   LAMBDASER <1.5 (L) 05/26/2018   KAPLAMBRATIO >17.93 (H) 05/26/2018   Lab Results  Component Value Date   IGGSERUM 194 (L) 05/26/2018   IGGSERUM 203 (L) 05/26/2018   IGA 952 (H) 05/26/2018   IGA 978 (H) 05/26/2018   IGMSERUM <5 (L) 05/26/2018   IGMSERUM <5 (L) 05/26/2018   Lab Results  Component Value Date   TOTALPROTELP 6.5  05/26/2018   ALBUMINELP 3.8 05/26/2018   A1GS 0.2 05/26/2018   A2GS 0.9 05/26/2018   BETS 0.8 05/26/2018   BETA2SER 2.0 (H) 12/08/2014   GAMS 0.9 05/26/2018   MSPIKE 0.4 (H) 05/26/2018  SPEI Comment 09/02/2017     Chemistry      Component Value Date/Time   NA 139 07/07/2018 0859   NA 141 01/21/2017 0741   NA 139 06/14/2016 1246   K 4.4 07/07/2018 0859   K 4.0 01/21/2017 0741   K 3.8 06/14/2016 1246   CL 103 07/07/2018 0859   CL 103 01/21/2017 0741   CO2 27 07/07/2018 0859   CO2 30 01/21/2017 0741   CO2 24 06/14/2016 1246   BUN 21 07/07/2018 0859   BUN 20 01/21/2017 0741   BUN 15.0 06/14/2016 1246   CREATININE 1.01 07/07/2018 0859   CREATININE 1.0 01/21/2017 0741   CREATININE 0.9 06/14/2016 1246      Component Value Date/Time   CALCIUM 9.8 07/07/2018 0859   CALCIUM 9.0 01/21/2017 0741   CALCIUM 9.6 06/14/2016 1246   ALKPHOS 40 07/07/2018 0859   ALKPHOS 37 01/21/2017 0741   ALKPHOS 54 06/14/2016 1246   AST 17 07/07/2018 0859   AST 25 06/14/2016 1246   ALT 24 07/07/2018 0859   ALT 29 01/21/2017 0741   ALT 21 06/14/2016 1246   BILITOT 0.3 07/07/2018 0859   BILITOT 0.37 06/14/2016 1246      Impression and Plan: Mr. Persichetti is a very pleasant 78 yo caucasian gentleman with history of IgA kappa myeloma. He had high risk cytogenetics with a 17p- abnormality.   Overall, I really think that he is done well.  His myeloma studies have looked stable.  I really hope that he gets through this shoulder surgery.  I know the staff on him right now.  We will plan to get him back in another 6 weeks.  He seems to have worked pretty well for him for the myeloma.    [name], MD 6/15/202010:40 AM

## 2018-07-07 NOTE — Patient Instructions (Signed)
Daratumumab injection What is this medicine? DARATUMUMAB (dar a toom ue mab) is a monoclonal antibody. It is used to treat multiple myeloma. This medicine may be used for other purposes; ask your health care provider or pharmacist if you have questions. COMMON BRAND NAME(S): DARZALEX What should I tell my health care provider before I take this medicine? They need to know if you have any of these conditions: -infection (especially a virus infection such as chickenpox, herpes, or hepatitis B virus) -lung or breathing disease -an unusual or allergic reaction to daratumumab, other medicines, foods, dyes, or preservatives -pregnant or trying to get pregnant -breast-feeding How should I use this medicine? This medicine is for infusion into a vein. It is given by a health care professional in a hospital or clinic setting. Talk to your pediatrician regarding the use of this medicine in children. Special care may be needed. Overdosage: If you think you have taken too much of this medicine contact a poison control center or emergency room at once. NOTE: This medicine is only for you. Do not share this medicine with others. What if I miss a dose? Keep appointments for follow-up doses as directed. It is important not to miss your dose. Call your doctor or health care professional if you are unable to keep an appointment. What may interact with this medicine? Interactions have not been studied. Give your health care provider a list of all the medicines, herbs, non-prescription drugs, or dietary supplements you use. Also tell them if you smoke, drink alcohol, or use illegal drugs. Some items may interact with your medicine. This list may not describe all possible interactions. Give your health care provider a list of all the medicines, herbs, non-prescription drugs, or dietary supplements you use. Also tell them if you smoke, drink alcohol, or use illegal drugs. Some items may interact with your  medicine. What should I watch for while using this medicine? This drug may make you feel generally unwell. Report any side effects. Continue your course of treatment even though you feel ill unless your doctor tells you to stop. This medicine can cause serious allergic reactions. To reduce your risk you may need to take medicine before treatment with this medicine. Take your medicine as directed. This medicine can affect the results of blood tests to match your blood type. These changes can last for up to 6 months after the final dose. Your healthcare provider will do blood tests to match your blood type before you start treatment. Tell all of your healthcare providers that you are being treated with this medicine before receiving a blood transfusion. This medicine can affect the results of some tests used to determine treatment response; extra tests may be needed to evaluate response. Do not become pregnant while taking this medicine or for 3 months after stopping it. Women should inform their doctor if they wish to become pregnant or think they might be pregnant. There is a potential for serious side effects to an unborn child. Talk to your health care professional or pharmacist for more information. What side effects may I notice from receiving this medicine? Side effects that you should report to your doctor or health care professional as soon as possible: -allergic reactions like skin rash, itching or hives, swelling of the face, lips, or tongue -breathing problems -chills -cough -dizziness -feeling faint or lightheaded -headache -low blood counts - this medicine may decrease the number of white blood cells, red blood cells and platelets. You may be at increased   risk for infections and bleeding. -nausea, vomiting -shortness of breath -signs of decreased platelets or bleeding - bruising, pinpoint red spots on the skin, black, tarry stools, blood in the urine -signs of decreased red blood  cells - unusually weak or tired, feeling faint or lightheaded, falls -signs of infection - fever or chills, cough, sore throat, pain or difficulty passing urine -signs and symptoms of liver injury like dark yellow or brown urine; general ill feeling or flu-like symptoms; light-colored stools; loss of appetite; right upper belly pain; unusually weak or tired; yellowing of the eyes or skin Side effects that usually do not require medical attention (report to your doctor or health care professional if they continue or are bothersome): -back pain -constipation -loss of appetite -diarrhea -joint pain -muscle cramps -pain, tingling, numbness in the hands or feet -swelling of the ankles, feet, hands -tiredness -trouble sleeping This list may not describe all possible side effects. Call your doctor for medical advice about side effects. You may report side effects to FDA at 1-800-FDA-1088. Where should I keep my medicine? Keep out of the reach of children. This drug is given in a hospital or clinic and will not be stored at home. NOTE: This sheet is a summary. It may not cover all possible information. If you have questions about this medicine, talk to your doctor, pharmacist, or health care provider.  2019 Elsevier/Gold Standard (2017-08-09 15:52:44)

## 2018-07-07 NOTE — Telephone Encounter (Signed)
Appointments scheduled calendar printed per 6/15 los 

## 2018-07-07 NOTE — Addendum Note (Signed)
Addended by: Burney Gauze R on: 07/07/2018 11:57 AM   Modules accepted: Orders

## 2018-07-08 LAB — KAPPA/LAMBDA LIGHT CHAINS
Kappa free light chain: 34.8 mg/L — ABNORMAL HIGH (ref 3.3–19.4)
Kappa, lambda light chain ratio: 17.4 — ABNORMAL HIGH (ref 0.26–1.65)
Lambda free light chains: 2 mg/L — ABNORMAL LOW (ref 5.7–26.3)

## 2018-07-08 LAB — IGG, IGA, IGM
IgA: 782 mg/dL — ABNORMAL HIGH (ref 61–437)
IgG (Immunoglobin G), Serum: 175 mg/dL — ABNORMAL LOW (ref 603–1613)
IgM (Immunoglobulin M), Srm: <5 mg/dL — ABNORMAL LOW (ref 15–143)

## 2018-07-11 ENCOUNTER — Telehealth: Payer: Self-pay | Admitting: *Deleted

## 2018-07-11 LAB — IMMUNOFIXATION REFLEX, SERUM
IgA: 626 mg/dL — ABNORMAL HIGH (ref 61–437)
IgG (Immunoglobin G), Serum: 135 mg/dL — ABNORMAL LOW (ref 603–1613)
IgM (Immunoglobulin M), Srm: <5 mg/dL — ABNORMAL LOW (ref 15–143)

## 2018-07-11 LAB — PROTEIN ELECTROPHORESIS, SERUM, WITH REFLEX
A/G Ratio: 1.3 (ref 0.7–1.7)
Albumin ELP: 2.8 g/dL — ABNORMAL LOW (ref 2.9–4.4)
Alpha-1-Globulin: 0.2 g/dL (ref 0.0–0.4)
Alpha-2-Globulin: 0.8 g/dL (ref 0.4–1.0)
Beta Globulin: 0.8 g/dL (ref 0.7–1.3)
Gamma Globulin: 0.5 g/dL (ref 0.4–1.8)
Globulin, Total: 2.2 g/dL (ref 2.2–3.9)
M-Spike, %: 0.2 g/dL — ABNORMAL HIGH
SPEP Interpretation: 0
Total Protein ELP: 5 g/dL — ABNORMAL LOW (ref 6.0–8.5)

## 2018-07-11 NOTE — Telephone Encounter (Signed)
-----   Message from [name], MD sent at 07/11/2018 10:35 AM EDT ----- Call - myeloma is down to 0.2!!!!  This is great1!   Laurey Arrow

## 2018-07-11 NOTE — Telephone Encounter (Signed)
Call received from patient's wife asking for a substitution for Fusion iron d/t pt.'s pharmacy states that it is not FDA approved.  Pt.'s wife notified that myeloma is down to 0.2.  Pt.'s wife than changed her mind and said that pt would take Fusion iron as ordered by Dr. Marin Olp. Pt.'s wife has no other questions at this time.

## 2018-07-18 ENCOUNTER — Ambulatory Visit: Payer: PPO | Admitting: *Deleted

## 2018-07-29 DIAGNOSIS — M19011 Primary osteoarthritis, right shoulder: Secondary | ICD-10-CM | POA: Diagnosis not present

## 2018-08-05 DIAGNOSIS — M25611 Stiffness of right shoulder, not elsewhere classified: Secondary | ICD-10-CM | POA: Diagnosis not present

## 2018-08-05 DIAGNOSIS — M25511 Pain in right shoulder: Secondary | ICD-10-CM | POA: Diagnosis not present

## 2018-08-05 DIAGNOSIS — M19011 Primary osteoarthritis, right shoulder: Secondary | ICD-10-CM | POA: Diagnosis not present

## 2018-08-05 DIAGNOSIS — M6281 Muscle weakness (generalized): Secondary | ICD-10-CM | POA: Diagnosis not present

## 2018-08-12 DIAGNOSIS — Z6837 Body mass index (BMI) 37.0-37.9, adult: Secondary | ICD-10-CM | POA: Diagnosis not present

## 2018-08-12 DIAGNOSIS — M545 Low back pain: Secondary | ICD-10-CM | POA: Diagnosis not present

## 2018-08-13 DIAGNOSIS — M25511 Pain in right shoulder: Secondary | ICD-10-CM | POA: Diagnosis not present

## 2018-08-13 DIAGNOSIS — M19011 Primary osteoarthritis, right shoulder: Secondary | ICD-10-CM | POA: Diagnosis not present

## 2018-08-13 DIAGNOSIS — M6281 Muscle weakness (generalized): Secondary | ICD-10-CM | POA: Diagnosis not present

## 2018-08-13 DIAGNOSIS — M25611 Stiffness of right shoulder, not elsewhere classified: Secondary | ICD-10-CM | POA: Diagnosis not present

## 2018-08-15 DIAGNOSIS — M19011 Primary osteoarthritis, right shoulder: Secondary | ICD-10-CM | POA: Diagnosis not present

## 2018-08-15 DIAGNOSIS — M25511 Pain in right shoulder: Secondary | ICD-10-CM | POA: Diagnosis not present

## 2018-08-15 DIAGNOSIS — M6281 Muscle weakness (generalized): Secondary | ICD-10-CM | POA: Diagnosis not present

## 2018-08-15 DIAGNOSIS — M25611 Stiffness of right shoulder, not elsewhere classified: Secondary | ICD-10-CM | POA: Diagnosis not present

## 2018-08-18 ENCOUNTER — Inpatient Hospital Stay: Payer: PPO | Attending: Hematology & Oncology | Admitting: Hematology & Oncology

## 2018-08-18 ENCOUNTER — Inpatient Hospital Stay: Payer: PPO

## 2018-08-18 ENCOUNTER — Encounter: Payer: Self-pay | Admitting: Hematology & Oncology

## 2018-08-18 ENCOUNTER — Other Ambulatory Visit: Payer: Self-pay

## 2018-08-18 VITALS — BP 148/81 | HR 82 | Temp 97.7°F | Resp 20 | Wt 208.1 lb

## 2018-08-18 VITALS — BP 132/81 | HR 80 | Temp 98.0°F | Resp 20

## 2018-08-18 DIAGNOSIS — Z79899 Other long term (current) drug therapy: Secondary | ICD-10-CM | POA: Insufficient documentation

## 2018-08-18 DIAGNOSIS — C9000 Multiple myeloma not having achieved remission: Secondary | ICD-10-CM

## 2018-08-18 DIAGNOSIS — Z5112 Encounter for antineoplastic immunotherapy: Secondary | ICD-10-CM | POA: Insufficient documentation

## 2018-08-18 DIAGNOSIS — C9002 Multiple myeloma in relapse: Secondary | ICD-10-CM | POA: Diagnosis not present

## 2018-08-18 DIAGNOSIS — M546 Pain in thoracic spine: Secondary | ICD-10-CM

## 2018-08-18 DIAGNOSIS — Z9481 Bone marrow transplant status: Secondary | ICD-10-CM

## 2018-08-18 LAB — CBC WITH DIFFERENTIAL (CANCER CENTER ONLY)
Abs Immature Granulocytes: 0.02 10*3/uL (ref 0.00–0.07)
Basophils Absolute: 0 10*3/uL (ref 0.0–0.1)
Basophils Relative: 1 %
Eosinophils Absolute: 0.1 10*3/uL (ref 0.0–0.5)
Eosinophils Relative: 1 %
HCT: 35.6 % — ABNORMAL LOW (ref 39.0–52.0)
Hemoglobin: 11.7 g/dL — ABNORMAL LOW (ref 13.0–17.0)
Immature Granulocytes: 0 %
Lymphocytes Relative: 34 %
Lymphs Abs: 2.2 10*3/uL (ref 0.7–4.0)
MCH: 34.1 pg — ABNORMAL HIGH (ref 26.0–34.0)
MCHC: 32.9 g/dL (ref 30.0–36.0)
MCV: 103.8 fL — ABNORMAL HIGH (ref 80.0–100.0)
Monocytes Absolute: 0.9 10*3/uL (ref 0.1–1.0)
Monocytes Relative: 14 %
Neutro Abs: 3.2 10*3/uL (ref 1.7–7.7)
Neutrophils Relative %: 50 %
Platelet Count: 174 10*3/uL (ref 150–400)
RBC: 3.43 MIL/uL — ABNORMAL LOW (ref 4.22–5.81)
RDW: 14.3 % (ref 11.5–15.5)
WBC Count: 6.4 10*3/uL (ref 4.0–10.5)
nRBC: 0 % (ref 0.0–0.2)

## 2018-08-18 LAB — CMP (CANCER CENTER ONLY)
ALT: 14 U/L (ref 0–44)
AST: 16 U/L (ref 15–41)
Albumin: 4.1 g/dL (ref 3.5–5.0)
Alkaline Phosphatase: 35 U/L — ABNORMAL LOW (ref 38–126)
Anion gap: 10 (ref 5–15)
BUN: 19 mg/dL (ref 8–23)
CO2: 27 mmol/L (ref 22–32)
Calcium: 8.7 mg/dL — ABNORMAL LOW (ref 8.9–10.3)
Chloride: 103 mmol/L (ref 98–111)
Creatinine: 0.84 mg/dL (ref 0.61–1.24)
GFR, Est AFR Am: >60 mL/min (ref >60)
GFR, Estimated: >60 mL/min (ref >60)
Glucose, Bld: 98 mg/dL (ref 70–99)
Potassium: 4.1 mmol/L (ref 3.5–5.1)
Sodium: 140 mmol/L (ref 135–145)
Total Bilirubin: 0.3 mg/dL (ref 0.3–1.2)
Total Protein: 6.5 g/dL (ref 6.5–8.1)

## 2018-08-18 MED ORDER — HEPARIN SOD (PORK) LOCK FLUSH 100 UNIT/ML IV SOLN
500.0000 [IU] | Freq: Once | INTRAVENOUS | Status: AC | PRN
  Administered 2018-08-18: 500 [IU]
  Filled 2018-08-18: qty 5

## 2018-08-18 MED ORDER — METHYLPREDNISOLONE SODIUM SUCC 125 MG IJ SOLR
125.0000 mg | Freq: Once | INTRAMUSCULAR | Status: AC
  Administered 2018-08-18: 125 mg via INTRAVENOUS

## 2018-08-18 MED ORDER — DIPHENHYDRAMINE HCL 25 MG PO CAPS
50.0000 mg | ORAL_CAPSULE | Freq: Once | ORAL | Status: AC
  Administered 2018-08-18: 50 mg via ORAL

## 2018-08-18 MED ORDER — OXYCODONE HCL 5 MG PO TABS: 5.0000 mg | ORAL_TABLET | Freq: Four times a day (QID) | ORAL | 0 refills | Status: DC | PRN

## 2018-08-18 MED ORDER — DIPHENHYDRAMINE HCL 25 MG PO CAPS
ORAL_CAPSULE | ORAL | Status: AC
  Filled 2018-08-18: qty 2

## 2018-08-18 MED ORDER — ACETAMINOPHEN 325 MG PO TABS
650.0000 mg | ORAL_TABLET | Freq: Once | ORAL | Status: AC
  Administered 2018-08-18: 650 mg via ORAL

## 2018-08-18 MED ORDER — SODIUM CHLORIDE 0.9 % IV SOLN
Freq: Once | INTRAVENOUS | Status: AC
  Administered 2018-08-18: 10:00:00 via INTRAVENOUS
  Filled 2018-08-18: qty 250

## 2018-08-18 MED ORDER — DENOSUMAB 120 MG/1.7ML ~~LOC~~ SOLN
SUBCUTANEOUS | Status: AC
  Filled 2018-08-18: qty 1.7

## 2018-08-18 MED ORDER — ACETAMINOPHEN 325 MG PO TABS
ORAL_TABLET | ORAL | Status: AC
  Filled 2018-08-18: qty 2

## 2018-08-18 MED ORDER — SODIUM CHLORIDE 0.9% FLUSH
10.0000 mL | INTRAVENOUS | Status: DC | PRN
  Administered 2018-08-18: 10 mL
  Filled 2018-08-18: qty 10

## 2018-08-18 MED ORDER — PROCHLORPERAZINE MALEATE 10 MG PO TABS
ORAL_TABLET | ORAL | Status: AC
  Filled 2018-08-18: qty 1

## 2018-08-18 MED ORDER — METHYLPREDNISOLONE SODIUM SUCC 125 MG IJ SOLR
INTRAMUSCULAR | Status: AC
  Filled 2018-08-18: qty 2

## 2018-08-18 MED ORDER — PROCHLORPERAZINE MALEATE 10 MG PO TABS
10.0000 mg | ORAL_TABLET | Freq: Once | ORAL | Status: AC
  Administered 2018-08-18: 10 mg via ORAL

## 2018-08-18 MED ORDER — SODIUM CHLORIDE 0.9 % IV SOLN
15.6000 mg/kg | Freq: Once | INTRAVENOUS | Status: AC
  Administered 2018-08-18: 1600 mg via INTRAVENOUS
  Filled 2018-08-18: qty 80

## 2018-08-18 MED ORDER — DENOSUMAB 120 MG/1.7ML ~~LOC~~ SOLN
120.0000 mg | Freq: Once | SUBCUTANEOUS | Status: AC
  Administered 2018-08-18: 120 mg via SUBCUTANEOUS

## 2018-08-18 NOTE — Patient Instructions (Signed)
Implanted Port Insertion, Care After This sheet gives you information about how to care for yourself after your procedure. Your health care provider may also give you more specific instructions. If you have problems or questions, contact your health care provider. What can I expect after the procedure? After the procedure, it is common to have:  Discomfort at the port insertion site.  Bruising on the skin over the port. This should improve over 3-4 days. Follow these instructions at home: Port care  After your port is placed, you will get a manufacturer's information card. The card has information about your port. Keep this card with you at all times.  Take care of the port as told by your health care provider. Ask your health care provider if you or a family member can get training for taking care of the port at home. A home health care nurse may also take care of the port.  Make sure to remember what type of port you have. Incision care      Follow instructions from your health care provider about how to take care of your port insertion site. Make sure you: ? Wash your hands with soap and water before and after you change your bandage (dressing). If soap and water are not available, use hand sanitizer. ? Change your dressing as told by your health care provider. ? Leave stitches (sutures), skin glue, or adhesive strips in place. These skin closures may need to stay in place for 2 weeks or longer. If adhesive strip edges start to loosen and curl up, you may trim the loose edges. Do not remove adhesive strips completely unless your health care provider tells you to do that.  Check your port insertion site every day for signs of infection. Check for: ? Redness, swelling, or pain. ? Fluid or blood. ? Warmth. ? Pus or a bad smell. Activity  Return to your normal activities as told by your health care provider. Ask your health care provider what activities are safe for you.  Do not  lift anything that is heavier than 10 lb (4.5 kg), or the limit that you are told, until your health care provider says that it is safe. General instructions  Take over-the-counter and prescription medicines only as told by your health care provider.  Do not take baths, swim, or use a hot tub until your health care provider approves. Ask your health care provider if you may take showers. You may only be allowed to take sponge baths.  Do not drive for 24 hours if you were given a sedative during your procedure.  Wear a medical alert bracelet in case of an emergency. This will tell any health care providers that you have a port.  Keep all follow-up visits as told by your health care provider. This is important. Contact a health care provider if:  You cannot flush your port with saline as directed, or you cannot draw blood from the port.  You have a fever or chills.  You have redness, swelling, or pain around your port insertion site.  You have fluid or blood coming from your port insertion site.  Your port insertion site feels warm to the touch.  You have pus or a bad smell coming from the port insertion site. Get help right away if:  You have chest pain or shortness of breath.  You have bleeding from your port that you cannot control. Summary  Take care of the port as told by your health   care provider. Keep the manufacturer's information card with you at all times.  Change your dressing as told by your health care provider.  Contact a health care provider if you have a fever or chills or if you have redness, swelling, or pain around your port insertion site.  Keep all follow-up visits as told by your health care provider. This information is not intended to replace advice given to you by your health care provider. Make sure you discuss any questions you have with your health care provider. Document Released: 10/29/2012 Document Revised: 08/06/2017 Document Reviewed: 08/06/2017  Elsevier Patient Education  2020 [employer]  

## 2018-08-18 NOTE — Progress Notes (Signed)
Hematology and Oncology Follow Up Visit  Gene Hill [id number] 1940/02/04 78 y.o. 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  He looks fantastic.  His right shoulder is so much better.  He is no longer in a sling.  He has pretty good range of motion of the right shoulder.  He is doing quite well with his garden.  He actually brought some food in for [place].  He brought in vegetables for [place].  His myeloma has done incredibly well.  His last M spike 0.2 g/dL.  His IgA level was 626 mg/dL.  His kappa light chain was 3.5 mg/dL.  His back is doing really bad.  He has had back problems.  He gets injections for this.  He has had no problems with bowels or bladder.  Has had a little bit of leg swelling but this seems to be getting better.  He has had no bleeding.  He has had no cough.  He has had no fever.  Overall, his performance status is ECOG 1.     Medications:  Allergies as of 08/18/2018   No Known Allergies     Medication List       Accurate as of August 18, 2018  8:49 AM. If you have any questions, ask your nurse or doctor.        aspirin EC 81 MG tablet Take 81 mg by mouth daily.   atorvastatin 10 MG tablet Commonly known as: LIPITOR Take 1 tablet (10 mg total) by mouth at bedtime.   calcium carbonate 1500 (600 Ca) MG Tabs tablet Commonly known as: OSCAL Take 600 mg of elemental calcium by mouth daily with breakfast.   celecoxib 100 MG capsule Commonly known as: CeleBREX Take 1 capsule (100 mg total) by mouth 2 (two) times daily.   DARZALEX IV Inject into the vein every 6 (six) weeks.   dexamethasone 2 MG tablet Commonly known as: DECADRON TAKE 2 TABLETS BY  MOUTH DAY AFTER CHEMO THEN 1 TABLET EVERY DAY FOR 4 DAYS What changed:   how much to take  how to take this  when to take this  additional instructions   Fish Oil 1200 MG Caps Take 1,200 mg by mouth daily.   fluticasone 50 MCG/ACT nasal spray Commonly known as: FLONASE Place 1 spray into both nostrils at bedtime.   Fusion Plus Caps Take 1 capsule by mouth daily.   lidocaine 5 % ointment Commonly known as: XYLOCAINE Apply 1 application topically 2 (two) times daily as needed. Mix w/ OTC Hydrocortisone 1 %   methocarbamol 500 MG tablet Commonly known as: ROBAXIN Take 1 tablet (500 mg total) by mouth 3 (three) times daily as needed for muscle spasms.   metoprolol tartrate 100 MG tablet Commonly known as: LOPRESSOR Take 0.5 tablets (50 mg total) by mouth 2 (two) times daily.   multivitamin tablet Take 1 tablet by mouth daily.   pantoprazole 40 MG tablet Commonly known as: PROTONIX Take 1 tablet (40 mg total) by mouth daily. What changed: when to take this   PROBIOTIC PO Take 1 tablet by mouth daily.  rivaroxaban 10 MG Tabs tablet Commonly known as: Xarelto Take 1 tablet (10 mg total) by mouth daily for 30 days.   VITAMIN B-12 PO Take 5,000 mcg by mouth daily.       Allergies: No Known Allergies  Past Medical History, Surgical history, Social history, and Family History were reviewed and updated.  Review of Systems: Review of Systems  Constitutional: Negative.   HENT: Negative.   Eyes: Negative.   Respiratory: Negative.   Cardiovascular: Negative.   Gastrointestinal: Negative.   Genitourinary: Negative.   Musculoskeletal: Positive for back pain and joint pain.  Skin: Negative.   Neurological: Negative.   Endo/Heme/Allergies: Negative.   Psychiatric/Behavioral: Negative.       Physical Exam:  weight is 208 lb 1.9 oz (94.4 kg). His oral temperature is 97.7 F (36.5 C). His blood pressure is 148/81 (abnormal) and his pulse is 82. His respiration  is 20 and oxygen saturation is 100%.   Wt Readings from Last 3 Encounters:  08/18/18 208 lb 1.9 oz (94.4 kg)  07/07/18 212 lb (96.2 kg)  07/07/18 212 lb 1.3 oz (96.2 kg)  Is  Physical Exam Vitals signs reviewed.  HENT:     Head: Normocephalic and atraumatic.  Eyes:     Pupils: Pupils are equal, round, and reactive to light.  Neck:     Musculoskeletal: Normal range of motion.  Cardiovascular:     Rate and Rhythm: Normal rate and regular rhythm.     Heart sounds: Normal heart sounds.  Pulmonary:     Effort: Pulmonary effort is normal.     Breath sounds: Normal breath sounds.  Abdominal:     General: Bowel sounds are normal.     Palpations: Abdomen is soft.  Musculoskeletal: Normal range of motion.        General: No tenderness or deformity.  Lymphadenopathy:     Cervical: No cervical adenopathy.  Skin:    General: Skin is warm and dry.     Findings: No erythema or rash.  Neurological:     Mental Status: He is alert and oriented to person, place, and time.  Psychiatric:        Behavior: Behavior normal.        Thought Content: Thought content normal.        Judgment: Judgment normal.      Lab Results  Component Value Date   WBC 5.2 07/07/2018   HGB 10.9 (L) 07/07/2018   HCT 33.9 (L) 07/07/2018   MCV 104.6 (H) 07/07/2018   PLT 207 07/07/2018   Lab Results  Component Value Date   FERRITIN 351 (H) 06/26/2011   IRON 75 06/26/2011   TIBC 353 06/26/2011   UIBC 278 06/26/2011   IRONPCTSAT 21 06/26/2011   Lab Results  Component Value Date   RBC 3.24 (L) 07/07/2018   Lab Results  Component Value Date   KPAFRELGTCHN 34.8 (H) 07/07/2018   LAMBDASER 2.0 (L) 07/07/2018   KAPLAMBRATIO 17.40 (H) 07/07/2018   Lab Results  Component Value Date   IGGSERUM 135 (L) 07/07/2018   IGA 626 (H) 07/07/2018   IGMSERUM <5 (L) 07/07/2018   Lab Results  Component Value Date   TOTALPROTELP 5.0 (L) 07/07/2018   ALBUMINELP 2.8 (L) 07/07/2018   A1GS 0.2 07/07/2018   A2GS 0.8  07/07/2018   BETS 0.8 07/07/2018   BETA2SER 2.0 (H) 12/08/2014   GAMS 0.5 07/07/2018   MSPIKE 0.2 (H) 07/07/2018   SPEI Comment 09/02/2017     Chemistry  Component Value Date/Time   NA 139 07/07/2018 0859   NA 141 01/21/2017 0741   NA 139 06/14/2016 1246   K 4.4 07/07/2018 0859   K 4.0 01/21/2017 0741   K 3.8 06/14/2016 1246   CL 103 07/07/2018 0859   CL 103 01/21/2017 0741   CO2 27 07/07/2018 0859   CO2 30 01/21/2017 0741   CO2 24 06/14/2016 1246   BUN 21 07/07/2018 0859   BUN 20 01/21/2017 0741   BUN 15.0 06/14/2016 1246   CREATININE 1.01 07/07/2018 0859   CREATININE 1.0 01/21/2017 0741   CREATININE 0.9 06/14/2016 1246      Component Value Date/Time   CALCIUM 9.8 07/07/2018 0859   CALCIUM 9.0 01/21/2017 0741   CALCIUM 9.6 06/14/2016 1246   ALKPHOS 40 07/07/2018 0859   ALKPHOS 37 01/21/2017 0741   ALKPHOS 54 06/14/2016 1246   AST 17 07/07/2018 0859   AST 25 06/14/2016 1246   ALT 24 07/07/2018 0859   ALT 29 01/21/2017 0741   ALT 21 06/14/2016 1246   BILITOT 0.3 07/07/2018 0859   BILITOT 0.37 06/14/2016 1246      Impression and Plan: Gene Hill is a very pleasant 78 yo caucasian gentleman with history of IgA kappa myeloma. He had high risk cytogenetics with a 17p- abnormality.   Overall, I really think that he is done well.  His myeloma studies have looked stable.  I would have to watch his light chain level.  His kappa light chain was up a little bit last time he was here.  Hopefully, we will have the subcutaneous version of daratumumab that we can use in the next couple months.  I will see him back in 6 weeks.  [name], MD 7/27/20208:49 AM

## 2018-08-18 NOTE — Addendum Note (Signed)
Addended by: [name] on: 08/18/2018 09:37 AM   Modules accepted: Orders

## 2018-08-18 NOTE — Progress Notes (Signed)
Reviewed labwork with Dr. Marin Olp.  Ok to treat  Calcium 8.7.  Due for Xgeva today.  Ok to give per dr. Marin Olp.  Told patient to make sure he is taking Calcium supplements or tums

## 2018-08-19 DIAGNOSIS — M25511 Pain in right shoulder: Secondary | ICD-10-CM | POA: Diagnosis not present

## 2018-08-19 DIAGNOSIS — M25611 Stiffness of right shoulder, not elsewhere classified: Secondary | ICD-10-CM | POA: Diagnosis not present

## 2018-08-19 DIAGNOSIS — M19011 Primary osteoarthritis, right shoulder: Secondary | ICD-10-CM | POA: Diagnosis not present

## 2018-08-19 DIAGNOSIS — M6281 Muscle weakness (generalized): Secondary | ICD-10-CM | POA: Diagnosis not present

## 2018-08-19 LAB — KAPPA/LAMBDA LIGHT CHAINS
Kappa free light chain: 47 mg/L — ABNORMAL HIGH (ref 3.3–19.4)
Kappa, lambda light chain ratio: >31.33 — ABNORMAL HIGH (ref 0.26–1.65)
Lambda free light chains: <1.5 mg/L — ABNORMAL LOW (ref 5.7–26.3)

## 2018-08-19 LAB — IGG, IGA, IGM
IgA: 953 mg/dL — ABNORMAL HIGH (ref 61–437)
IgG (Immunoglobin G), Serum: 150 mg/dL — ABNORMAL LOW (ref 603–1613)
IgM (Immunoglobulin M), Srm: 6 mg/dL — ABNORMAL LOW (ref 15–143)

## 2018-08-21 DIAGNOSIS — M19011 Primary osteoarthritis, right shoulder: Secondary | ICD-10-CM | POA: Diagnosis not present

## 2018-08-21 DIAGNOSIS — M25611 Stiffness of right shoulder, not elsewhere classified: Secondary | ICD-10-CM | POA: Diagnosis not present

## 2018-08-21 DIAGNOSIS — M25511 Pain in right shoulder: Secondary | ICD-10-CM | POA: Diagnosis not present

## 2018-08-21 DIAGNOSIS — M6281 Muscle weakness (generalized): Secondary | ICD-10-CM | POA: Diagnosis not present

## 2018-08-21 LAB — PROTEIN ELECTROPHORESIS, SERUM, WITH REFLEX
A/G Ratio: 1.3 (ref 0.7–1.7)
Albumin ELP: 3.7 g/dL (ref 2.9–4.4)
Alpha-1-Globulin: 0.2 g/dL (ref 0.0–0.4)
Alpha-2-Globulin: 0.9 g/dL (ref 0.4–1.0)
Beta Globulin: 0.9 g/dL (ref 0.7–1.3)
Gamma Globulin: 0.8 g/dL (ref 0.4–1.8)
Globulin, Total: 2.8 g/dL (ref 2.2–3.9)
M-Spike, %: 0.4 g/dL — ABNORMAL HIGH
SPEP Interpretation: 0
Total Protein ELP: 6.5 g/dL (ref 6.0–8.5)

## 2018-08-21 LAB — IMMUNOFIXATION REFLEX, SERUM
IgA: 1003 mg/dL — ABNORMAL HIGH (ref 61–437)
IgG (Immunoglobin G), Serum: 176 mg/dL — ABNORMAL LOW (ref 603–1613)
IgM (Immunoglobulin M), Srm: <5 mg/dL — ABNORMAL LOW (ref 15–143)

## 2018-08-26 ENCOUNTER — Telehealth: Payer: Self-pay | Admitting: *Deleted

## 2018-08-26 DIAGNOSIS — M6281 Muscle weakness (generalized): Secondary | ICD-10-CM | POA: Diagnosis not present

## 2018-08-26 DIAGNOSIS — M19011 Primary osteoarthritis, right shoulder: Secondary | ICD-10-CM | POA: Diagnosis not present

## 2018-08-26 DIAGNOSIS — M25511 Pain in right shoulder: Secondary | ICD-10-CM | POA: Diagnosis not present

## 2018-08-26 DIAGNOSIS — M25611 Stiffness of right shoulder, not elsewhere classified: Secondary | ICD-10-CM | POA: Diagnosis not present

## 2018-08-26 NOTE — Telephone Encounter (Signed)
Call received from patient's wife to inform Dr. Marin Olp that pt has an appt with Dr. Matilde Sprang with [facility] on 09/12/18 and is requesting records to be faxed to Dr. Matilde Sprang.  Records faxed per request.

## 2018-08-28 DIAGNOSIS — M25611 Stiffness of right shoulder, not elsewhere classified: Secondary | ICD-10-CM | POA: Diagnosis not present

## 2018-08-28 DIAGNOSIS — M6281 Muscle weakness (generalized): Secondary | ICD-10-CM | POA: Diagnosis not present

## 2018-08-28 DIAGNOSIS — M19011 Primary osteoarthritis, right shoulder: Secondary | ICD-10-CM | POA: Diagnosis not present

## 2018-08-28 DIAGNOSIS — M25511 Pain in right shoulder: Secondary | ICD-10-CM | POA: Diagnosis not present

## 2018-09-02 DIAGNOSIS — M25611 Stiffness of right shoulder, not elsewhere classified: Secondary | ICD-10-CM | POA: Diagnosis not present

## 2018-09-02 DIAGNOSIS — M6281 Muscle weakness (generalized): Secondary | ICD-10-CM | POA: Diagnosis not present

## 2018-09-02 DIAGNOSIS — M19011 Primary osteoarthritis, right shoulder: Secondary | ICD-10-CM | POA: Diagnosis not present

## 2018-09-02 DIAGNOSIS — M25511 Pain in right shoulder: Secondary | ICD-10-CM | POA: Diagnosis not present

## 2018-09-04 DIAGNOSIS — M6281 Muscle weakness (generalized): Secondary | ICD-10-CM | POA: Diagnosis not present

## 2018-09-04 DIAGNOSIS — M25611 Stiffness of right shoulder, not elsewhere classified: Secondary | ICD-10-CM | POA: Diagnosis not present

## 2018-09-04 DIAGNOSIS — M19011 Primary osteoarthritis, right shoulder: Secondary | ICD-10-CM | POA: Diagnosis not present

## 2018-09-04 DIAGNOSIS — M25511 Pain in right shoulder: Secondary | ICD-10-CM | POA: Diagnosis not present

## 2018-09-09 ENCOUNTER — Telehealth: Payer: Self-pay | Admitting: *Deleted

## 2018-09-09 DIAGNOSIS — M25511 Pain in right shoulder: Secondary | ICD-10-CM | POA: Diagnosis not present

## 2018-09-09 DIAGNOSIS — M25611 Stiffness of right shoulder, not elsewhere classified: Secondary | ICD-10-CM | POA: Diagnosis not present

## 2018-09-09 DIAGNOSIS — M6281 Muscle weakness (generalized): Secondary | ICD-10-CM | POA: Diagnosis not present

## 2018-09-09 DIAGNOSIS — M19011 Primary osteoarthritis, right shoulder: Secondary | ICD-10-CM | POA: Diagnosis not present

## 2018-09-09 NOTE — Telephone Encounter (Signed)
Message received from patient's wife requesting patient's med list be emailed to her so that med list can be taken to pt.'s urology appt on Friday, 09/12/18.  Call placed back to patient's wife to inform her that med list can not be emailed to her and that med list has been faxed to urology when records were faxed on 08/26/2018.

## 2018-09-11 DIAGNOSIS — M6281 Muscle weakness (generalized): Secondary | ICD-10-CM | POA: Diagnosis not present

## 2018-09-11 DIAGNOSIS — M25511 Pain in right shoulder: Secondary | ICD-10-CM | POA: Diagnosis not present

## 2018-09-11 DIAGNOSIS — H35373 Puckering of macula, bilateral: Secondary | ICD-10-CM | POA: Diagnosis not present

## 2018-09-11 DIAGNOSIS — M25611 Stiffness of right shoulder, not elsewhere classified: Secondary | ICD-10-CM | POA: Diagnosis not present

## 2018-09-11 DIAGNOSIS — M19011 Primary osteoarthritis, right shoulder: Secondary | ICD-10-CM | POA: Diagnosis not present

## 2018-09-11 DIAGNOSIS — H25811 Combined forms of age-related cataract, right eye: Secondary | ICD-10-CM | POA: Diagnosis not present

## 2018-09-11 DIAGNOSIS — H2512 Age-related nuclear cataract, left eye: Secondary | ICD-10-CM | POA: Diagnosis not present

## 2018-09-12 DIAGNOSIS — R3914 Feeling of incomplete bladder emptying: Secondary | ICD-10-CM | POA: Diagnosis not present

## 2018-09-12 DIAGNOSIS — R351 Nocturia: Secondary | ICD-10-CM | POA: Diagnosis not present

## 2018-09-12 DIAGNOSIS — R3912 Poor urinary stream: Secondary | ICD-10-CM | POA: Diagnosis not present

## 2018-09-12 DIAGNOSIS — R350 Frequency of micturition: Secondary | ICD-10-CM | POA: Diagnosis not present

## 2018-09-16 DIAGNOSIS — M25511 Pain in right shoulder: Secondary | ICD-10-CM | POA: Diagnosis not present

## 2018-09-16 DIAGNOSIS — M19011 Primary osteoarthritis, right shoulder: Secondary | ICD-10-CM | POA: Diagnosis not present

## 2018-09-16 DIAGNOSIS — M25611 Stiffness of right shoulder, not elsewhere classified: Secondary | ICD-10-CM | POA: Diagnosis not present

## 2018-09-16 DIAGNOSIS — M6281 Muscle weakness (generalized): Secondary | ICD-10-CM | POA: Diagnosis not present

## 2018-09-18 DIAGNOSIS — M6281 Muscle weakness (generalized): Secondary | ICD-10-CM | POA: Diagnosis not present

## 2018-09-18 DIAGNOSIS — M19011 Primary osteoarthritis, right shoulder: Secondary | ICD-10-CM | POA: Diagnosis not present

## 2018-09-18 DIAGNOSIS — M25611 Stiffness of right shoulder, not elsewhere classified: Secondary | ICD-10-CM | POA: Diagnosis not present

## 2018-09-18 DIAGNOSIS — M25511 Pain in right shoulder: Secondary | ICD-10-CM | POA: Diagnosis not present

## 2018-09-22 ENCOUNTER — Other Ambulatory Visit: Payer: Self-pay | Admitting: Internal Medicine

## 2018-09-23 ENCOUNTER — Other Ambulatory Visit: Payer: Self-pay | Admitting: Orthopaedic Surgery

## 2018-09-23 DIAGNOSIS — M25611 Stiffness of right shoulder, not elsewhere classified: Secondary | ICD-10-CM | POA: Diagnosis not present

## 2018-09-23 DIAGNOSIS — M25512 Pain in left shoulder: Secondary | ICD-10-CM

## 2018-09-23 DIAGNOSIS — M6281 Muscle weakness (generalized): Secondary | ICD-10-CM | POA: Diagnosis not present

## 2018-09-23 DIAGNOSIS — M25511 Pain in right shoulder: Secondary | ICD-10-CM | POA: Diagnosis not present

## 2018-09-23 DIAGNOSIS — M19011 Primary osteoarthritis, right shoulder: Secondary | ICD-10-CM | POA: Diagnosis not present

## 2018-09-25 DIAGNOSIS — M6281 Muscle weakness (generalized): Secondary | ICD-10-CM | POA: Diagnosis not present

## 2018-09-25 DIAGNOSIS — M19011 Primary osteoarthritis, right shoulder: Secondary | ICD-10-CM | POA: Diagnosis not present

## 2018-09-25 DIAGNOSIS — M25611 Stiffness of right shoulder, not elsewhere classified: Secondary | ICD-10-CM | POA: Diagnosis not present

## 2018-09-25 DIAGNOSIS — M25511 Pain in right shoulder: Secondary | ICD-10-CM | POA: Diagnosis not present

## 2018-09-30 ENCOUNTER — Inpatient Hospital Stay: Payer: PPO

## 2018-09-30 ENCOUNTER — Inpatient Hospital Stay: Payer: PPO | Attending: Hematology & Oncology | Admitting: Hematology & Oncology

## 2018-09-30 ENCOUNTER — Other Ambulatory Visit: Payer: Self-pay

## 2018-09-30 VITALS — BP 146/81 | HR 72 | Temp 97.1°F | Resp 19 | Wt 208.8 lb

## 2018-09-30 DIAGNOSIS — D649 Anemia, unspecified: Secondary | ICD-10-CM | POA: Insufficient documentation

## 2018-09-30 DIAGNOSIS — Z5112 Encounter for antineoplastic immunotherapy: Secondary | ICD-10-CM | POA: Diagnosis not present

## 2018-09-30 DIAGNOSIS — C9002 Multiple myeloma in relapse: Secondary | ICD-10-CM | POA: Diagnosis not present

## 2018-09-30 DIAGNOSIS — C9000 Multiple myeloma not having achieved remission: Secondary | ICD-10-CM

## 2018-09-30 DIAGNOSIS — E349 Endocrine disorder, unspecified: Secondary | ICD-10-CM

## 2018-09-30 DIAGNOSIS — K2270 Barrett's esophagus without dysplasia: Secondary | ICD-10-CM

## 2018-09-30 LAB — CBC WITH DIFFERENTIAL (CANCER CENTER ONLY)
Abs Immature Granulocytes: 0.02 10*3/uL (ref 0.00–0.07)
Basophils Absolute: 0 10*3/uL (ref 0.0–0.1)
Basophils Relative: 0 %
Eosinophils Absolute: 0.1 10*3/uL (ref 0.0–0.5)
Eosinophils Relative: 1 %
HCT: 34.4 % — ABNORMAL LOW (ref 39.0–52.0)
Hemoglobin: 11.2 g/dL — ABNORMAL LOW (ref 13.0–17.0)
Immature Granulocytes: 0 %
Lymphocytes Relative: 42 %
Lymphs Abs: 2.2 10*3/uL (ref 0.7–4.0)
MCH: 34.1 pg — ABNORMAL HIGH (ref 26.0–34.0)
MCHC: 32.6 g/dL (ref 30.0–36.0)
MCV: 104.9 fL — ABNORMAL HIGH (ref 80.0–100.0)
Monocytes Absolute: 0.7 10*3/uL (ref 0.1–1.0)
Monocytes Relative: 13 %
Neutro Abs: 2.3 10*3/uL (ref 1.7–7.7)
Neutrophils Relative %: 44 %
Platelet Count: 164 10*3/uL (ref 150–400)
RBC: 3.28 MIL/uL — ABNORMAL LOW (ref 4.22–5.81)
RDW: 14.2 % (ref 11.5–15.5)
WBC Count: 5.4 10*3/uL (ref 4.0–10.5)
nRBC: 0 % (ref 0.0–0.2)

## 2018-09-30 LAB — CMP (CANCER CENTER ONLY)
ALT: 19 U/L (ref 0–44)
AST: 19 U/L (ref 15–41)
Albumin: 4.3 g/dL (ref 3.5–5.0)
Alkaline Phosphatase: 41 U/L (ref 38–126)
Anion gap: 9 (ref 5–15)
BUN: 15 mg/dL (ref 8–23)
CO2: 28 mmol/L (ref 22–32)
Calcium: 9.7 mg/dL (ref 8.9–10.3)
Chloride: 102 mmol/L (ref 98–111)
Creatinine: 0.92 mg/dL (ref 0.61–1.24)
GFR, Est AFR Am: >60 mL/min (ref >60)
GFR, Estimated: >60 mL/min (ref >60)
Glucose, Bld: 105 mg/dL — ABNORMAL HIGH (ref 70–99)
Potassium: 3.9 mmol/L (ref 3.5–5.1)
Sodium: 139 mmol/L (ref 135–145)
Total Bilirubin: 0.4 mg/dL (ref 0.3–1.2)
Total Protein: 7.1 g/dL (ref 6.5–8.1)

## 2018-09-30 MED ORDER — DIPHENHYDRAMINE HCL 25 MG PO CAPS
ORAL_CAPSULE | ORAL | Status: AC
  Filled 2018-09-30: qty 2

## 2018-09-30 MED ORDER — HEPARIN SOD (PORK) LOCK FLUSH 100 UNIT/ML IV SOLN
500.0000 [IU] | Freq: Once | INTRAVENOUS | Status: AC | PRN
  Administered 2018-09-30: 12:00:00 500 [IU]
  Filled 2018-09-30: qty 5

## 2018-09-30 MED ORDER — DIPHENHYDRAMINE HCL 25 MG PO CAPS
50.0000 mg | ORAL_CAPSULE | Freq: Once | ORAL | Status: AC
  Administered 2018-09-30: 50 mg via ORAL

## 2018-09-30 MED ORDER — ACETAMINOPHEN 325 MG PO TABS
650.0000 mg | ORAL_TABLET | Freq: Once | ORAL | Status: AC
  Administered 2018-09-30: 09:00:00 650 mg via ORAL

## 2018-09-30 MED ORDER — ACETAMINOPHEN 325 MG PO TABS
ORAL_TABLET | ORAL | Status: AC
  Filled 2018-09-30: qty 2

## 2018-09-30 MED ORDER — METHYLPREDNISOLONE SODIUM SUCC 125 MG IJ SOLR
INTRAMUSCULAR | Status: AC
  Filled 2018-09-30: qty 2

## 2018-09-30 MED ORDER — PROCHLORPERAZINE MALEATE 10 MG PO TABS
10.0000 mg | ORAL_TABLET | Freq: Once | ORAL | Status: AC
  Administered 2018-09-30: 10 mg via ORAL

## 2018-09-30 MED ORDER — SODIUM CHLORIDE 0.9 % IV SOLN
15.7000 mg/kg | Freq: Once | INTRAVENOUS | Status: AC
  Administered 2018-09-30: 11:00:00 1600 mg via INTRAVENOUS
  Filled 2018-09-30: qty 80

## 2018-09-30 MED ORDER — PROCHLORPERAZINE MALEATE 10 MG PO TABS
ORAL_TABLET | ORAL | Status: AC
  Filled 2018-09-30: qty 1

## 2018-09-30 MED ORDER — SODIUM CHLORIDE 0.9 % IV SOLN
Freq: Once | INTRAVENOUS | Status: AC
  Administered 2018-09-30: 09:00:00 via INTRAVENOUS
  Filled 2018-09-30: qty 250

## 2018-09-30 MED ORDER — SODIUM CHLORIDE 0.9% FLUSH
10.0000 mL | INTRAVENOUS | Status: DC | PRN
  Administered 2018-09-30: 10 mL
  Filled 2018-09-30: qty 10

## 2018-09-30 MED ORDER — METHYLPREDNISOLONE SODIUM SUCC 125 MG IJ SOLR
125.0000 mg | Freq: Once | INTRAMUSCULAR | Status: AC
  Administered 2018-09-30: 125 mg via INTRAVENOUS

## 2018-09-30 MED ORDER — MIRTAZAPINE 15 MG PO TABS: 15.0000 mg | ORAL_TABLET | Freq: Every day | ORAL | 2 refills | Status: DC

## 2018-09-30 NOTE — Patient Instructions (Addendum)
Daratumumab injection What is this medicine? DARATUMUMAB (dar a toom ue mab) is a monoclonal antibody. It is used to treat multiple myeloma. This medicine may be used for other purposes; ask your health care provider or pharmacist if you have questions. COMMON BRAND NAME(S): DARZALEX What should I tell my health care provider before I take this medicine? They need to know if you have any of these conditions:  infection (especially a virus infection such as chickenpox, herpes, or hepatitis B virus)  lung or breathing disease  an unusual or allergic reaction to daratumumab, other medicines, foods, dyes, or preservatives  pregnant or trying to get pregnant  breast-feeding How should I use this medicine? This medicine is for infusion into a vein. It is given by a health care professional in a hospital or clinic setting. Talk to your pediatrician regarding the use of this medicine in children. Special care may be needed. Overdosage: If you think you have taken too much of this medicine contact a poison control center or emergency room at once. NOTE: This medicine is only for you. Do not share this medicine with others. What if I miss a dose? Keep appointments for follow-up doses as directed. It is important not to miss your dose. Call your doctor or health care professional if you are unable to keep an appointment. What may interact with this medicine? Interactions have not been studied. This list may not describe all possible interactions. Give your health care provider a list of all the medicines, herbs, non-prescription drugs, or dietary supplements you use. Also tell them if you smoke, drink alcohol, or use illegal drugs. Some items may interact with your medicine. What should I watch for while using this medicine? This drug may make you feel generally unwell. Report any side effects. Continue your course of treatment even though you feel ill unless your doctor tells you to stop. This  medicine can cause serious allergic reactions. To reduce your risk you may need to take medicine before treatment with this medicine. Take your medicine as directed. This medicine can affect the results of blood tests to match your blood type. These changes can last for up to 6 months after the final dose. Your healthcare provider will do blood tests to match your blood type before you start treatment. Tell all of your healthcare providers that you are being treated with this medicine before receiving a blood transfusion. This medicine can affect the results of some tests used to determine treatment response; extra tests may be needed to evaluate response. Do not become pregnant while taking this medicine or for 3 months after stopping it. Women should inform their doctor if they wish to become pregnant or think they might be pregnant. There is a potential for serious side effects to an unborn child. Talk to your health care professional or pharmacist for more information. What side effects may I notice from receiving this medicine? Side effects that you should report to your doctor or health care professional as soon as possible:  allergic reactions like skin rash, itching or hives, swelling of the face, lips, or tongue  breathing problems  chills  cough  dizziness  feeling faint or lightheaded  headache  low blood counts - this medicine may decrease the number of white blood cells, red blood cells and platelets. You may be at increased risk for infections and bleeding.  nausea, vomiting  shortness of breath  signs of decreased platelets or bleeding - bruising, pinpoint red spots on  the skin, black, tarry stools, blood in the urine  signs of decreased red blood cells - unusually weak or tired, feeling faint or lightheaded, falls  signs of infection - fever or chills, cough, sore throat, pain or difficulty passing urine  signs and symptoms of liver injury like dark yellow or brown  urine; general ill feeling or flu-like symptoms; light-colored stools; loss of appetite; right upper belly pain; unusually weak or tired; yellowing of the eyes or skin Side effects that usually do not require medical attention (report to your doctor or health care professional if they continue or are bothersome):  back pain  constipation  loss of appetite  diarrhea  joint pain  muscle cramps  pain, tingling, numbness in the hands or feet  swelling of the ankles, feet, hands  tiredness  trouble sleeping This list may not describe all possible side effects. Call your doctor for medical advice about side effects. You may report side effects to FDA at 1-800-FDA-1088. Where should I keep my medicine? Keep out of the reach of children. This drug is given in a hospital or clinic and will not be stored at home. NOTE: This sheet is a summary. It may not cover all possible information. If you have questions about this medicine, talk to your doctor, pharmacist, or health care provider.  2020 Elsevier/Gold Standard (2017-10-24 14:00:48) dara

## 2018-09-30 NOTE — Patient Instructions (Signed)
Implanted Port Insertion, Care After This sheet gives you information about how to care for yourself after your procedure. Your health care provider may also give you more specific instructions. If you have problems or questions, contact your health care provider. What can I expect after the procedure? After the procedure, it is common to have:  Discomfort at the port insertion site.  Bruising on the skin over the port. This should improve over 3-4 days. Follow these instructions at home: Port care  After your port is placed, you will get a manufacturer's information card. The card has information about your port. Keep this card with you at all times.  Take care of the port as told by your health care provider. Ask your health care provider if you or a family member can get training for taking care of the port at home. A home health care nurse may also take care of the port.  Make sure to remember what type of port you have. Incision care      Follow instructions from your health care provider about how to take care of your port insertion site. Make sure you: ? Wash your hands with soap and water before and after you change your bandage (dressing). If soap and water are not available, use hand sanitizer. ? Change your dressing as told by your health care provider. ? Leave stitches (sutures), skin glue, or adhesive strips in place. These skin closures may need to stay in place for 2 weeks or longer. If adhesive strip edges start to loosen and curl up, you may trim the loose edges. Do not remove adhesive strips completely unless your health care provider tells you to do that.  Check your port insertion site every day for signs of infection. Check for: ? Redness, swelling, or pain. ? Fluid or blood. ? Warmth. ? Pus or a bad smell. Activity  Return to your normal activities as told by your health care provider. Ask your health care provider what activities are safe for you.  Do not  lift anything that is heavier than 10 lb (4.5 kg), or the limit that you are told, until your health care provider says that it is safe. General instructions  Take over-the-counter and prescription medicines only as told by your health care provider.  Do not take baths, swim, or use a hot tub until your health care provider approves. Ask your health care provider if you may take showers. You may only be allowed to take sponge baths.  Do not drive for 24 hours if you were given a sedative during your procedure.  Wear a medical alert bracelet in case of an emergency. This will tell any health care providers that you have a port.  Keep all follow-up visits as told by your health care provider. This is important. Contact a health care provider if:  You cannot flush your port with saline as directed, or you cannot draw blood from the port.  You have a fever or chills.  You have redness, swelling, or pain around your port insertion site.  You have fluid or blood coming from your port insertion site.  Your port insertion site feels warm to the touch.  You have pus or a bad smell coming from the port insertion site. Get help right away if:  You have chest pain or shortness of breath.  You have bleeding from your port that you cannot control. Summary  Take care of the port as told by your health   care provider. Keep the manufacturer's information card with you at all times.  Change your dressing as told by your health care provider.  Contact a health care provider if you have a fever or chills or if you have redness, swelling, or pain around your port insertion site.  Keep all follow-up visits as told by your health care provider. This information is not intended to replace advice given to you by your health care provider. Make sure you discuss any questions you have with your health care provider. Document Released: 10/29/2012 Document Revised: 08/06/2017 Document Reviewed: 08/06/2017  Elsevier Patient Education  2020 [employer]  

## 2018-09-30 NOTE — Progress Notes (Signed)
Hematology and Oncology Follow Up Visit  AYSON Hill [id number] 1940-09-08 78 y.o. 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  His main complaint has been that of some fatigue.  Not sure exactly what might be going on with this.  I know he has a little bit of a anemia that might be contributing.  What his wife says, she thinks he may have an element of depression.  I certainly can understand this.  His appetite seems to come and go.  I will try him on some Remeron at 15 mg p.o. daily.  Maybe this will help a little bit.  I am little worried regarding the myeloma.  When we last saw him, [name] jumped up to 0.4 g/dL.  His IgA level was 1000 mg/dL.  His kappa light chain was 4.7 mg/dL.  He has been on the daratumumab for quite a while.  I think we started this back in November 2018.  As such, he really has had a very nice response.  He is still gardening.  As always, he brought in a lot of vegetables.  I am very humbled by his generosity.  His right shoulder is doing well.  It sounds like he may need surgery for the left shoulder.  He has had no change in bowel or bladder habits.  He has had no issues with cough.  He has had no leg swelling.  Overall, his performance status is ECOG 1.     Medications:  Allergies as of 09/30/2018   No Known Allergies     Medication List       Accurate as of September 30, 2018  8:42 AM. If you have any questions, ask your nurse or doctor.        STOP taking these medications   celecoxib 100 MG capsule Commonly known as: CeleBREX Stopped by: [name], MD   Fusion Plus Caps Stopped by: [name], MD   lidocaine 5 %  ointment Commonly known as: XYLOCAINE Stopped by: [name], MD   rivaroxaban 10 MG Tabs tablet Commonly known as: Xarelto Stopped by: [name], MD     TAKE these medications   aspirin 325 MG tablet Take 325 mg by mouth daily. What changed: Another medication with the same name was removed. Continue taking this medication, and follow the directions you see here. Changed by: [name], MD   atorvastatin 10 MG tablet Commonly known as: LIPITOR Take 1 tablet (10 mg total) by mouth at bedtime.   b complex vitamins tablet Take 1 tablet by mouth daily.   calcium carbonate 1500 (600 Ca) MG Tabs tablet Commonly known as: OSCAL Take 600 mg of elemental calcium by mouth daily with breakfast.   DARZALEX IV Inject into the vein every 6 (six) weeks.   dexamethasone 2 MG tablet Commonly known as: DECADRON TAKE 2 TABLETS BY MOUTH DAY AFTER CHEMO THEN 1 TABLET EVERY DAY FOR 4 DAYS What changed:   how much to take  how to take this  when to take this  additional instructions  Fish Oil 1200 MG Caps Take 1,200 mg by mouth daily.   fluticasone 50 MCG/ACT nasal spray Commonly known as: FLONASE Place 1 spray into both nostrils at bedtime.   IRON PO Take 65 mg by mouth.   methocarbamol 500 MG tablet Commonly known as: ROBAXIN Take 1 tablet (500 mg total) by mouth 3 (three) times daily as needed for muscle spasms.   metoprolol tartrate 100 MG tablet Commonly known as: LOPRESSOR Take 0.5 tablets (50 mg total) by mouth 2 (two) times daily.   multivitamin tablet Take 1 tablet by mouth daily.   oxyCODONE 5 MG immediate release tablet Commonly known as: Oxy IR/ROXICODONE Take 1 tablet (5 mg total) by mouth every 6 (six) hours as needed for severe pain.   pantoprazole 40 MG tablet Commonly known as: PROTONIX Take 1 tablet (40 mg total) by mouth at bedtime.   PROBIOTIC PO Take 1 tablet by mouth daily.   tamsulosin 0.4 MG Caps capsule Commonly known as:  FLOMAX Take 0.4 mg by mouth daily.   VITAMIN B-12 PO Take 5,000 mcg by mouth daily.   ZOFRAN PO Take by mouth as needed.       Allergies: No Known Allergies  Past Medical History, Surgical history, Social history, and Family History were reviewed and updated.  Review of Systems: Review of Systems  Constitutional: Negative.   HENT: Negative.   Eyes: Negative.   Respiratory: Negative.   Cardiovascular: Negative.   Gastrointestinal: Negative.   Genitourinary: Negative.   Musculoskeletal: Positive for back pain and joint pain.  Skin: Negative.   Neurological: Negative.   Endo/Heme/Allergies: Negative.   Psychiatric/Behavioral: Negative.       Physical Exam:  weight is 208 lb 12 oz (94.7 kg). His temporal temperature is 97.1 F (36.2 C) (abnormal). His blood pressure is 146/81 (abnormal) and his pulse is 72. His respiration is 19 and oxygen saturation is 99%.   Wt Readings from Last 3 Encounters:  09/30/18 208 lb 12 oz (94.7 kg)  08/18/18 208 lb 1.9 oz (94.4 kg)  07/07/18 212 lb (96.2 kg)  Is  Physical Exam Vitals signs reviewed.  HENT:     Head: Normocephalic and atraumatic.  Eyes:     Pupils: Pupils are equal, round, and reactive to light.  Neck:     Musculoskeletal: Normal range of motion.  Cardiovascular:     Rate and Rhythm: Normal rate and regular rhythm.     Heart sounds: Normal heart sounds.  Pulmonary:     Effort: Pulmonary effort is normal.     Breath sounds: Normal breath sounds.  Abdominal:     General: Bowel sounds are normal.     Palpations: Abdomen is soft.  Musculoskeletal: Normal range of motion.        General: No tenderness or deformity.  Lymphadenopathy:     Cervical: No cervical adenopathy.  Skin:    General: Skin is warm and dry.     Findings: No erythema or rash.  Neurological:     Mental Status: He is alert and oriented to person, place, and time.  Psychiatric:        Behavior: Behavior normal.        Thought Content: Thought  content normal.        Judgment: Judgment normal.      Lab Results  Component Value Date   WBC 5.4 09/30/2018   HGB 11.2 (L) 09/30/2018   HCT 34.4 (L) 09/30/2018   MCV 104.9 (H) 09/30/2018  PLT 164 09/30/2018   Lab Results  Component Value Date   FERRITIN 351 (H) 06/26/2011   IRON 75 06/26/2011   TIBC 353 06/26/2011   UIBC 278 06/26/2011   IRONPCTSAT 21 06/26/2011   Lab Results  Component Value Date   RBC 3.28 (L) 09/30/2018   Lab Results  Component Value Date   KPAFRELGTCHN 47.0 (H) 08/18/2018   LAMBDASER <1.5 (L) 08/18/2018   KAPLAMBRATIO >31.33 (H) 08/18/2018   Lab Results  Component Value Date   IGGSERUM 150 (L) 08/18/2018   IGA 953 (H) 08/18/2018   IGMSERUM 6 (L) 08/18/2018   Lab Results  Component Value Date   TOTALPROTELP 6.5 08/18/2018   ALBUMINELP 3.7 08/18/2018   A1GS 0.2 08/18/2018   A2GS 0.9 08/18/2018   BETS 0.9 08/18/2018   BETA2SER 2.0 (H) 12/08/2014   GAMS 0.8 08/18/2018   MSPIKE 0.4 (H) 08/18/2018   SPEI Comment 09/02/2017     Chemistry      Component Value Date/Time   NA 140 08/18/2018 0857   NA 141 01/21/2017 0741   NA 139 06/14/2016 1246   K 4.1 08/18/2018 0857   K 4.0 01/21/2017 0741   K 3.8 06/14/2016 1246   CL 103 08/18/2018 0857   CL 103 01/21/2017 0741   CO2 27 08/18/2018 0857   CO2 30 01/21/2017 0741   CO2 24 06/14/2016 1246   BUN 19 08/18/2018 0857   BUN 20 01/21/2017 0741   BUN 15.0 06/14/2016 1246   CREATININE 0.84 08/18/2018 0857   CREATININE 1.0 01/21/2017 0741   CREATININE 0.9 06/14/2016 1246      Component Value Date/Time   CALCIUM 8.7 (L) 08/18/2018 0857   CALCIUM 9.0 01/21/2017 0741   CALCIUM 9.6 06/14/2016 1246   ALKPHOS 35 (L) 08/18/2018 0857   ALKPHOS 37 01/21/2017 0741   ALKPHOS 54 06/14/2016 1246   AST 16 08/18/2018 0857   AST 25 06/14/2016 1246   ALT 14 08/18/2018 0857   ALT 29 01/21/2017 0741   ALT 21 06/14/2016 1246   BILITOT 0.3 08/18/2018 0857   BILITOT 0.37 06/14/2016 1246       Impression and Plan: Mr. Nedved is a very pleasant 78 yo caucasian gentleman with history of IgA kappa myeloma. He had high risk cytogenetics with a 17p- abnormality.   I think that this myeloma panel will be critical.  This will really show [place] if we need to make any adjustments.  When I may consider if possible would be to add carfilzomib to the daratumumab.  I clearly would not have to give him full dose.  I still think his bone marrow would handle that.  I may also consider repeating a bone marrow biopsy on him so we can see how everything looks with the bone marrow and also with the cytogenetics.  I would like to have him come back in a month just that we can have a little more close follow-up.    [name], MD 9/8/20208:42 AM

## 2018-10-01 LAB — IGG, IGA, IGM
IgA: 1078 mg/dL — ABNORMAL HIGH (ref 61–437)
IgG (Immunoglobin G), Serum: 165 mg/dL — ABNORMAL LOW (ref 603–1613)
IgM (Immunoglobulin M), Srm: <5 mg/dL — ABNORMAL LOW (ref 15–143)

## 2018-10-01 LAB — KAPPA/LAMBDA LIGHT CHAINS
Kappa free light chain: 57.9 mg/L — ABNORMAL HIGH (ref 3.3–19.4)
Kappa, lambda light chain ratio: 25.17 — ABNORMAL HIGH (ref 0.26–1.65)
Lambda free light chains: 2.3 mg/L — ABNORMAL LOW (ref 5.7–26.3)

## 2018-10-02 DIAGNOSIS — M19011 Primary osteoarthritis, right shoulder: Secondary | ICD-10-CM | POA: Diagnosis not present

## 2018-10-02 DIAGNOSIS — M6281 Muscle weakness (generalized): Secondary | ICD-10-CM | POA: Diagnosis not present

## 2018-10-02 DIAGNOSIS — M25511 Pain in right shoulder: Secondary | ICD-10-CM | POA: Diagnosis not present

## 2018-10-02 DIAGNOSIS — M25611 Stiffness of right shoulder, not elsewhere classified: Secondary | ICD-10-CM | POA: Diagnosis not present

## 2018-10-03 ENCOUNTER — Telehealth: Payer: Self-pay | Admitting: *Deleted

## 2018-10-03 LAB — IMMUNOFIXATION REFLEX, SERUM
IgA: 1189 mg/dL — ABNORMAL HIGH (ref 61–437)
IgG (Immunoglobin G), Serum: 180 mg/dL — ABNORMAL LOW (ref 603–1613)
IgM (Immunoglobulin M), Srm: <5 mg/dL — ABNORMAL LOW (ref 15–143)

## 2018-10-03 LAB — PROTEIN ELECTROPHORESIS, SERUM, WITH REFLEX
A/G Ratio: 1.3 (ref 0.7–1.7)
Albumin ELP: 3.9 g/dL (ref 2.9–4.4)
Alpha-1-Globulin: 0.2 g/dL (ref 0.0–0.4)
Alpha-2-Globulin: 1 g/dL (ref 0.4–1.0)
Beta Globulin: 1.6 g/dL — ABNORMAL HIGH (ref 0.7–1.3)
Gamma Globulin: 0.2 g/dL — ABNORMAL LOW (ref 0.4–1.8)
Globulin, Total: 3.1 g/dL (ref 2.2–3.9)
M-Spike, %: 0.4 g/dL — ABNORMAL HIGH
SPEP Interpretation: 0
Total Protein ELP: 7 g/dL (ref 6.0–8.5)

## 2018-10-03 NOTE — Telephone Encounter (Signed)
-----   Message from [name], MD sent at 10/03/2018 12:54 PM EDT ----- Call - the myeloma is holding stable at 0.4!!  Great job!!  Laurey Arrow

## 2018-10-03 NOTE — Telephone Encounter (Signed)
Pt.'s wife notified per order of Dr. Marin Olp that "the myeloma is holding stable at 0.4!! Great job!!"  Pt.'s wife appreciative of call and has no questions or concerns at this time.

## 2018-10-18 ENCOUNTER — Ambulatory Visit
Admission: RE | Admit: 2018-10-18 | Discharge: 2018-10-18 | Disposition: A | Discharge status: Home / Self Care | Payer: PPO | Source: Ambulatory Visit | Attending: Orthopaedic Surgery | Admitting: Orthopaedic Surgery

## 2018-10-18 ENCOUNTER — Other Ambulatory Visit: Payer: Self-pay

## 2018-10-18 DIAGNOSIS — M19012 Primary osteoarthritis, left shoulder: Secondary | ICD-10-CM | POA: Diagnosis not present

## 2018-10-18 DIAGNOSIS — M25512 Pain in left shoulder: Secondary | ICD-10-CM

## 2018-11-03 ENCOUNTER — Encounter: Payer: Self-pay | Admitting: Hematology & Oncology

## 2018-11-03 ENCOUNTER — Inpatient Hospital Stay: Payer: PPO

## 2018-11-03 ENCOUNTER — Other Ambulatory Visit: Payer: Self-pay

## 2018-11-03 ENCOUNTER — Inpatient Hospital Stay (HOSPITAL_BASED_OUTPATIENT_CLINIC_OR_DEPARTMENT_OTHER): Payer: PPO | Admitting: Hematology & Oncology

## 2018-11-03 ENCOUNTER — Inpatient Hospital Stay: Payer: PPO | Attending: Hematology & Oncology

## 2018-11-03 VITALS — BP 142/76 | HR 76

## 2018-11-03 DIAGNOSIS — E349 Endocrine disorder, unspecified: Secondary | ICD-10-CM

## 2018-11-03 DIAGNOSIS — C9000 Multiple myeloma not having achieved remission: Secondary | ICD-10-CM

## 2018-11-03 DIAGNOSIS — Z9484 Stem cells transplant status: Secondary | ICD-10-CM | POA: Insufficient documentation

## 2018-11-03 DIAGNOSIS — C9002 Multiple myeloma in relapse: Secondary | ICD-10-CM | POA: Insufficient documentation

## 2018-11-03 DIAGNOSIS — Z79899 Other long term (current) drug therapy: Secondary | ICD-10-CM | POA: Insufficient documentation

## 2018-11-03 DIAGNOSIS — Z5112 Encounter for antineoplastic immunotherapy: Secondary | ICD-10-CM | POA: Diagnosis not present

## 2018-11-03 DIAGNOSIS — K2270 Barrett's esophagus without dysplasia: Secondary | ICD-10-CM

## 2018-11-03 LAB — IRON AND TIBC
Iron: 76 ug/dL (ref 42–163)
Saturation Ratios: 24 % (ref 20–55)
TIBC: 318 ug/dL (ref 202–409)
UIBC: 241 ug/dL (ref 117–376)

## 2018-11-03 LAB — CMP (CANCER CENTER ONLY)
ALT: 19 U/L (ref 0–44)
AST: 17 U/L (ref 15–41)
Albumin: 4 g/dL (ref 3.5–5.0)
Alkaline Phosphatase: 33 U/L — ABNORMAL LOW (ref 38–126)
Anion gap: 9 (ref 5–15)
BUN: 15 mg/dL (ref 8–23)
CO2: 27 mmol/L (ref 22–32)
Calcium: 9.8 mg/dL (ref 8.9–10.3)
Chloride: 105 mmol/L (ref 98–111)
Creatinine: 0.92 mg/dL (ref 0.61–1.24)
GFR, Est AFR Am: >60 mL/min (ref >60)
GFR, Estimated: >60 mL/min (ref >60)
Glucose, Bld: 112 mg/dL — ABNORMAL HIGH (ref 70–99)
Potassium: 3.8 mmol/L (ref 3.5–5.1)
Sodium: 141 mmol/L (ref 135–145)
Total Bilirubin: 0.3 mg/dL (ref 0.3–1.2)
Total Protein: 6.9 g/dL (ref 6.5–8.1)

## 2018-11-03 LAB — CBC WITH DIFFERENTIAL (CANCER CENTER ONLY)
Abs Immature Granulocytes: 0.02 10*3/uL (ref 0.00–0.07)
Basophils Absolute: 0 10*3/uL (ref 0.0–0.1)
Basophils Relative: 0 %
Eosinophils Absolute: 0 10*3/uL (ref 0.0–0.5)
Eosinophils Relative: 1 %
HCT: 35.7 % — ABNORMAL LOW (ref 39.0–52.0)
Hemoglobin: 11.6 g/dL — ABNORMAL LOW (ref 13.0–17.0)
Immature Granulocytes: 0 %
Lymphocytes Relative: 37 %
Lymphs Abs: 1.9 10*3/uL (ref 0.7–4.0)
MCH: 34.3 pg — ABNORMAL HIGH (ref 26.0–34.0)
MCHC: 32.5 g/dL (ref 30.0–36.0)
MCV: 105.6 fL — ABNORMAL HIGH (ref 80.0–100.0)
Monocytes Absolute: 0.7 10*3/uL (ref 0.1–1.0)
Monocytes Relative: 13 %
Neutro Abs: 2.5 10*3/uL (ref 1.7–7.7)
Neutrophils Relative %: 49 %
Platelet Count: 171 10*3/uL (ref 150–400)
RBC: 3.38 MIL/uL — ABNORMAL LOW (ref 4.22–5.81)
RDW: 14.7 % (ref 11.5–15.5)
WBC Count: 5.1 10*3/uL (ref 4.0–10.5)
nRBC: 0 % (ref 0.0–0.2)

## 2018-11-03 LAB — FERRITIN: Ferritin: 46 ng/mL (ref 24–336)

## 2018-11-03 MED ORDER — DIPHENHYDRAMINE HCL 25 MG PO CAPS
ORAL_CAPSULE | ORAL | Status: AC
  Filled 2018-11-03: qty 2

## 2018-11-03 MED ORDER — SODIUM CHLORIDE 0.9% FLUSH
10.0000 mL | INTRAVENOUS | Status: DC | PRN
  Administered 2018-11-03: 10 mL
  Filled 2018-11-03: qty 10

## 2018-11-03 MED ORDER — OXYCODONE HCL 5 MG PO TABS: 5.0000 mg | ORAL_TABLET | Freq: Four times a day (QID) | ORAL | 0 refills | Status: DC | PRN

## 2018-11-03 MED ORDER — HEPARIN SOD (PORK) LOCK FLUSH 100 UNIT/ML IV SOLN
500.0000 [IU] | Freq: Once | INTRAVENOUS | Status: AC | PRN
  Administered 2018-11-03: 500 [IU]
  Filled 2018-11-03: qty 5

## 2018-11-03 MED ORDER — DIPHENHYDRAMINE HCL 25 MG PO CAPS
50.0000 mg | ORAL_CAPSULE | Freq: Once | ORAL | Status: AC
  Administered 2018-11-03: 50 mg via ORAL

## 2018-11-03 MED ORDER — PROCHLORPERAZINE MALEATE 10 MG PO TABS
ORAL_TABLET | ORAL | Status: AC
  Filled 2018-11-03: qty 1

## 2018-11-03 MED ORDER — DARATUMUMAB-HYALURONIDASE-FIHJ 1800-30000 MG-UT/15ML ~~LOC~~ SOLN
1800.0000 mg | Freq: Once | SUBCUTANEOUS | Status: AC
  Administered 2018-11-03: 1800 mg via SUBCUTANEOUS
  Filled 2018-11-03: qty 15

## 2018-11-03 MED ORDER — PROCHLORPERAZINE MALEATE 10 MG PO TABS
10.0000 mg | ORAL_TABLET | Freq: Once | ORAL | Status: AC
  Administered 2018-11-03: 10 mg via ORAL

## 2018-11-03 MED ORDER — ACETAMINOPHEN 325 MG PO TABS
ORAL_TABLET | ORAL | Status: AC
  Filled 2018-11-03: qty 2

## 2018-11-03 MED ORDER — ACETAMINOPHEN 325 MG PO TABS
650.0000 mg | ORAL_TABLET | Freq: Once | ORAL | Status: AC
  Administered 2018-11-03: 650 mg via ORAL

## 2018-11-03 MED ORDER — DEXAMETHASONE 4 MG PO TABS
20.0000 mg | ORAL_TABLET | Freq: Once | ORAL | Status: AC
  Administered 2018-11-03: 20 mg via ORAL

## 2018-11-03 NOTE — Progress Notes (Signed)
Hematology and Oncology Follow Up Visit  Gene Hill [id number] Mar 15, 1940 78 y.o. 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 is here today for follow-up.  He is doing pretty well.  Unfortunately, he is going need to have surgery for his left shoulder.  This will be next week.  He also is having back issues.  He sees orthopedic surgery for this.  Sure if he will get a injections.  He also has some mouth issues.  I told him that I did not think this was from the daratumumab.  It is not chemotherapy so really should not cause mucositis.  As far as his myeloma is concerned, he is done well.  He is holding stable.  His last monoclonal spike was 0.4 g/dL.  His IgA level was 1100 mg/dL.  His kappa light chain was 5.8 mg/dL.  He is not having any problems with cough.  He is having no headache.  He is having no problems with bowels or bladder.  He still is doing his organic gardening.  He really has done nicely with this.  He probably has some garlic today.  Overall, I would say performance status is ECOG 1.   Medications:  Allergies as of 11/03/2018   No Known Allergies     Medication List       Accurate as of November 03, 2018  8:39 AM. If you have any questions, ask your nurse or doctor.        aspirin 325 MG tablet Take 325 mg by mouth daily.   atorvastatin 10 MG tablet Commonly known as: LIPITOR Take 1 tablet (10 mg total) by mouth at bedtime.   b complex vitamins tablet Take 1 tablet by mouth daily.   calcium carbonate 1500 (600 Ca) MG Tabs tablet Commonly known as: OSCAL Take 600 mg of elemental calcium by mouth daily with breakfast.   clindamycin 150 MG capsule Commonly known as: CLEOCIN  Take 150 mg by mouth as needed. Take four capsules total of 600 mg one hour before dental work.   DARZALEX IV Inject into the vein every 6 (six) weeks.   dexamethasone 2 MG tablet Commonly known as: DECADRON TAKE 2 TABLETS BY MOUTH DAY AFTER CHEMO THEN 1 TABLET EVERY DAY FOR 4 DAYS What changed:   how much to take  how to take this  when to take this  additional instructions   Fish Oil 1200 MG Caps Take 1,200 mg by mouth daily.   fluticasone 50 MCG/ACT nasal spray Commonly known as: FLONASE Place 1 spray into both nostrils at bedtime.   IRON PO Take 65 mg by mouth.   methocarbamol 500 MG tablet Commonly known as: ROBAXIN Take 1 tablet (500 mg total) by mouth 3 (three) times daily as needed for muscle spasms.   metoprolol tartrate 100 MG tablet Commonly known as: LOPRESSOR Take 0.5 tablets (50 mg total) by mouth 2 (two) times daily.   mirtazapine 15 MG tablet Commonly known as: Remeron Take 1 tablet (15 mg total) by mouth at bedtime.   multivitamin tablet Take 1 tablet by mouth daily.   oxyCODONE 5 MG immediate release  tablet Commonly known as: Oxy IR/ROXICODONE Take 1 tablet (5 mg total) by mouth every 6 (six) hours as needed for severe pain.   pantoprazole 40 MG tablet Commonly known as: PROTONIX Take 1 tablet (40 mg total) by mouth at bedtime.   PROBIOTIC PO Take 1 tablet by mouth daily.   tamsulosin 0.4 MG Caps capsule Commonly known as: FLOMAX Take 0.4 mg by mouth daily.   VITAMIN B-12 PO Take 5,000 mcg by mouth daily.   ZOFRAN PO Take by mouth as needed.       Allergies: No Known Allergies  Past Medical History, Surgical history, Social history, and Family History were reviewed and updated.  Review of Systems: Review of Systems  Constitutional: Negative.   HENT: Negative.   Eyes: Negative.   Respiratory: Negative.   Cardiovascular: Negative.   Gastrointestinal: Negative.   Genitourinary: Negative.   Musculoskeletal: Positive for  back pain and joint pain.  Skin: Negative.   Neurological: Negative.   Endo/Heme/Allergies: Negative.   Psychiatric/Behavioral: Negative.       Physical Exam:  vitals were not taken for this visit.   Wt Readings from Last 3 Encounters:  11/03/18 209 lb (94.8 kg)  09/30/18 208 lb 12 oz (94.7 kg)  08/18/18 208 lb 1.9 oz (94.4 kg)  Is  Physical Exam Vitals signs reviewed.  HENT:     Head: Normocephalic and atraumatic.  Eyes:     Pupils: Pupils are equal, round, and reactive to light.  Neck:     Musculoskeletal: Normal range of motion.  Cardiovascular:     Rate and Rhythm: Normal rate and regular rhythm.     Heart sounds: Normal heart sounds.  Pulmonary:     Effort: Pulmonary effort is normal.     Breath sounds: Normal breath sounds.  Abdominal:     General: Bowel sounds are normal.     Palpations: Abdomen is soft.  Musculoskeletal: Normal range of motion.        General: No tenderness or deformity.  Lymphadenopathy:     Cervical: No cervical adenopathy.  Skin:    General: Skin is warm and dry.     Findings: No erythema or rash.  Neurological:     Mental Status: He is alert and oriented to person, place, and time.  Psychiatric:        Behavior: Behavior normal.        Thought Content: Thought content normal.        Judgment: Judgment normal.      Lab Results  Component Value Date   WBC 5.1 11/03/2018   HGB 11.6 (L) 11/03/2018   HCT 35.7 (L) 11/03/2018   MCV 105.6 (H) 11/03/2018   PLT 171 11/03/2018   Lab Results  Component Value Date   FERRITIN 351 (H) 06/26/2011   IRON 75 06/26/2011   TIBC 353 06/26/2011   UIBC 278 06/26/2011   IRONPCTSAT 21 06/26/2011   Lab Results  Component Value Date   RBC 3.38 (L) 11/03/2018   Lab Results  Component Value Date   KPAFRELGTCHN 57.9 (H) 09/30/2018   LAMBDASER 2.3 (L) 09/30/2018   KAPLAMBRATIO 25.17 (H) 09/30/2018   Lab Results  Component Value Date   IGGSERUM 180 (L) 09/30/2018   IGA 1,189 (H) 09/30/2018    IGMSERUM <5 (L) 09/30/2018   Lab Results  Component Value Date   TOTALPROTELP 7.0 09/30/2018   ALBUMINELP 3.9 09/30/2018   A1GS 0.2 09/30/2018   A2GS 1.0 09/30/2018   BETS 1.6 (H)  09/30/2018   BETA2SER 2.0 (H) 12/08/2014   GAMS 0.2 (L) 09/30/2018   MSPIKE 0.4 (H) 09/30/2018   SPEI Comment 09/02/2017     Chemistry      Component Value Date/Time   NA 139 09/30/2018 0810   NA 141 01/21/2017 0741   NA 139 06/14/2016 1246   K 3.9 09/30/2018 0810   K 4.0 01/21/2017 0741   K 3.8 06/14/2016 1246   CL 102 09/30/2018 0810   CL 103 01/21/2017 0741   CO2 28 09/30/2018 0810   CO2 30 01/21/2017 0741   CO2 24 06/14/2016 1246   BUN 15 09/30/2018 0810   BUN 20 01/21/2017 0741   BUN 15.0 06/14/2016 1246   CREATININE 0.92 09/30/2018 0810   CREATININE 1.0 01/21/2017 0741   CREATININE 0.9 06/14/2016 1246      Component Value Date/Time   CALCIUM 9.7 09/30/2018 0810   CALCIUM 9.0 01/21/2017 0741   CALCIUM 9.6 06/14/2016 1246   ALKPHOS 41 09/30/2018 0810   ALKPHOS 37 01/21/2017 0741   ALKPHOS 54 06/14/2016 1246   AST 19 09/30/2018 0810   AST 25 06/14/2016 1246   ALT 19 09/30/2018 0810   ALT 29 01/21/2017 0741   ALT 21 06/14/2016 1246   BILITOT 0.4 09/30/2018 0810   BILITOT 0.37 06/14/2016 1246      Impression and Plan: Mr. Grishaber is a very pleasant 78 yo caucasian gentleman with history of IgA kappa myeloma. He had high risk cytogenetics with a 17p- abnormality.   We will have to see what his monoclonal studies look like.  Hopefully they will still be stable.  We will try to get him back the week after Thanksgiving.  He should be healed up from his surgery.  I am just glad that overall, his quality of life is doing well.      [name], MD 10/12/20208:39 AM

## 2018-11-03 NOTE — Addendum Note (Signed)
Addended by: Burney Gauze R on: 11/03/2018 10:44 AM   Modules accepted: Orders

## 2018-11-03 NOTE — Patient Instructions (Signed)

## 2018-11-03 NOTE — Patient Instructions (Signed)
Daratumumab injection What is this medicine? DARATUMUMAB (dar a toom ue mab) is a monoclonal antibody. It is used to treat multiple myeloma. This medicine may be used for other purposes; ask your health care provider or pharmacist if you have questions. COMMON BRAND NAME(S): DARZALEX What should I tell my health care provider before I take this medicine? They need to know if you have any of these conditions:  infection (especially a virus infection such as chickenpox, herpes, or hepatitis B virus)  lung or breathing disease  an unusual or allergic reaction to daratumumab, other medicines, foods, dyes, or preservatives  pregnant or trying to get pregnant  breast-feeding How should I use this medicine? This medicine is for infusion into a vein. It is given by a health care professional in a hospital or clinic setting. Talk to your pediatrician regarding the use of this medicine in children. Special care may be needed. Overdosage: If you think you have taken too much of this medicine contact a poison control center or emergency room at once. NOTE: This medicine is only for you. Do not share this medicine with others. What if I miss a dose? Keep appointments for follow-up doses as directed. It is important not to miss your dose. Call your doctor or health care professional if you are unable to keep an appointment. What may interact with this medicine? Interactions have not been studied. This list may not describe all possible interactions. Give your health care provider a list of all the medicines, herbs, non-prescription drugs, or dietary supplements you use. Also tell them if you smoke, drink alcohol, or use illegal drugs. Some items may interact with your medicine. What should I watch for while using this medicine? This drug may make you feel generally unwell. Report any side effects. Continue your course of treatment even though you feel ill unless your doctor tells you to stop. This  medicine can cause serious allergic reactions. To reduce your risk you may need to take medicine before treatment with this medicine. Take your medicine as directed. This medicine can affect the results of blood tests to match your blood type. These changes can last for up to 6 months after the final dose. Your healthcare provider will do blood tests to match your blood type before you start treatment. Tell all of your healthcare providers that you are being treated with this medicine before receiving a blood transfusion. This medicine can affect the results of some tests used to determine treatment response; extra tests may be needed to evaluate response. Do not become pregnant while taking this medicine or for 3 months after stopping it. Women should inform their doctor if they wish to become pregnant or think they might be pregnant. There is a potential for serious side effects to an unborn child. Talk to your health care professional or pharmacist for more information. What side effects may I notice from receiving this medicine? Side effects that you should report to your doctor or health care professional as soon as possible:  allergic reactions like skin rash, itching or hives, swelling of the face, lips, or tongue  breathing problems  chills  cough  dizziness  feeling faint or lightheaded  headache  low blood counts - this medicine may decrease the number of white blood cells, red blood cells and platelets. You may be at increased risk for infections and bleeding.  nausea, vomiting  shortness of breath  signs of decreased platelets or bleeding - bruising, pinpoint red spots on  the skin, black, tarry stools, blood in the urine  signs of decreased red blood cells - unusually weak or tired, feeling faint or lightheaded, falls  signs of infection - fever or chills, cough, sore throat, pain or difficulty passing urine  signs and symptoms of liver injury like dark yellow or brown  urine; general ill feeling or flu-like symptoms; light-colored stools; loss of appetite; right upper belly pain; unusually weak or tired; yellowing of the eyes or skin Side effects that usually do not require medical attention (report to your doctor or health care professional if they continue or are bothersome):  back pain  constipation  loss of appetite  diarrhea  joint pain  muscle cramps  pain, tingling, numbness in the hands or feet  swelling of the ankles, feet, hands  tiredness  trouble sleeping This list may not describe all possible side effects. Call your doctor for medical advice about side effects. You may report side effects to FDA at 1-800-FDA-1088. Where should I keep my medicine? Keep out of the reach of children. This drug is given in a hospital or clinic and will not be stored at home. NOTE: This sheet is a summary. It may not cover all possible information. If you have questions about this medicine, talk to your doctor, pharmacist, or health care provider.  2020 [employer]/Gold Standard (2017-10-24 14:00:48)

## 2018-11-03 NOTE — Progress Notes (Signed)
Dr. Marin Olp would like to transition this patient from Darzalex IV to FasPro today. Orders and premeds changed per his instructions.  PA okay per Otilio Carpen, Financial Advocate.

## 2018-11-04 ENCOUNTER — Ambulatory Visit: Payer: PPO | Admitting: Hematology & Oncology

## 2018-11-04 ENCOUNTER — Other Ambulatory Visit: Payer: PPO

## 2018-11-04 ENCOUNTER — Ambulatory Visit: Payer: PPO

## 2018-11-04 LAB — KAPPA/LAMBDA LIGHT CHAINS
Kappa free light chain: 84.2 mg/L — ABNORMAL HIGH (ref 3.3–19.4)
Kappa, lambda light chain ratio: 35.08 — ABNORMAL HIGH (ref 0.26–1.65)
Lambda free light chains: 2.4 mg/L — ABNORMAL LOW (ref 5.7–26.3)

## 2018-11-04 LAB — IGG, IGA, IGM
IgA: 1321 mg/dL — ABNORMAL HIGH (ref 61–437)
IgG (Immunoglobin G), Serum: 177 mg/dL — ABNORMAL LOW (ref 603–1613)
IgM (Immunoglobulin M), Srm: 6 mg/dL — ABNORMAL LOW (ref 15–143)

## 2018-11-04 LAB — ERYTHROPOIETIN: Erythropoietin: 45.4 m[IU]/mL — ABNORMAL HIGH (ref 2.6–18.5)

## 2018-11-04 LAB — TESTOSTERONE: Testosterone: 203 ng/dL — ABNORMAL LOW (ref 264–916)

## 2018-11-05 LAB — IMMUNOFIXATION REFLEX, SERUM
IgA: 1449 mg/dL — ABNORMAL HIGH (ref 61–437)
IgG (Immunoglobin G), Serum: 171 mg/dL — ABNORMAL LOW (ref 603–1613)
IgM (Immunoglobulin M), Srm: <5 mg/dL — ABNORMAL LOW (ref 15–143)

## 2018-11-05 LAB — PROTEIN ELECTROPHORESIS, SERUM, WITH REFLEX
A/G Ratio: 1.2 (ref 0.7–1.7)
Albumin ELP: 3.7 g/dL (ref 2.9–4.4)
Alpha-1-Globulin: 0.2 g/dL (ref 0.0–0.4)
Alpha-2-Globulin: 0.8 g/dL (ref 0.4–1.0)
Beta Globulin: 1.7 g/dL — ABNORMAL HIGH (ref 0.7–1.3)
Gamma Globulin: 0.3 g/dL — ABNORMAL LOW (ref 0.4–1.8)
Globulin, Total: 3 g/dL (ref 2.2–3.9)
M-Spike, %: 0.5 g/dL — ABNORMAL HIGH
SPEP Interpretation: 0
Total Protein ELP: 6.7 g/dL (ref 6.0–8.5)

## 2018-11-06 DIAGNOSIS — M545 Low back pain: Secondary | ICD-10-CM | POA: Diagnosis not present

## 2018-11-07 DIAGNOSIS — R3914 Feeling of incomplete bladder emptying: Secondary | ICD-10-CM | POA: Diagnosis not present

## 2018-11-07 DIAGNOSIS — N130 Hydronephrosis with ureteropelvic junction obstruction: Secondary | ICD-10-CM | POA: Diagnosis not present

## 2018-11-07 DIAGNOSIS — R3912 Poor urinary stream: Secondary | ICD-10-CM | POA: Diagnosis not present

## 2018-11-10 ENCOUNTER — Ambulatory Visit: Payer: PPO | Admitting: Family

## 2018-11-10 ENCOUNTER — Other Ambulatory Visit: Payer: PPO

## 2018-11-10 ENCOUNTER — Ambulatory Visit: Payer: PPO

## 2018-11-11 NOTE — Patient Instructions (Addendum)
DUE TO COVID-19 ONLY ONE VISITOR IS ALLOWED TO COME WITH YOU AND STAY IN THE WAITING ROOM ONLY DURING PRE OP AND PROCEDURE DAY OF SURGERY. THE 1 VISITOR MAY VISIT WITH YOU AFTER SURGERY IN YOUR PRIVATE ROOM DURING VISITING HOURS ONLY!  YOU NEED TO HAVE A COVID 19 TEST ON___10 /24/20____ @_10 :25______, THIS TEST MUST BE DONE BEFORE SURGERY, COME  [address], [place] , [address].  ([place]) ONCE YOUR COVID TEST IS COMPLETED, PLEASE BEGIN THE QUARANTINE INSTRUCTIONS AS OUTLINED IN YOUR HANDOUT.                [place]    Your procedure is scheduled on: 11/19/18   Report to [facility] Main  Entrance   Report to Short Stay 5:30 AM     Call this number if you have problems the morning of surgery [phone]   . BRUSH YOUR TEETH MORNING OF SURGERY AND RINSE YOUR MOUTH OUT, NO CHEWING GUM CANDY OR MINTS.   Do not eat food After Midnight.   YOU MAY HAVE CLEAR LIQUIDS FROM MIDNIGHT UNTIL 4:30AM.   At 4:30AM Please finish the prescribed Pre-Surgery  drink  . Nothing by mouth after you finish the  drink !   Take these medicines the morning of surgery with A SIP OF WATER: Metoprolol, Flomax, Protonix, use Flonase if needed                                 You may not have any metal on your body including piercings             Do not wear jewelry,  lotions, powders or  deodorant                        Men may shave face and neck.   Do not bring valuables to the hospital. [place].  Contacts, dentures or bridgework may not be worn into surgery.                   Please read over the following fact sheets you were given: _____________________________________________________________________             [facility] - Preparing for Surgery  Before surgery, you can play an important role.   Because skin is not sterile, your skin needs to be as free of germs as possible.   You can reduce the  number of germs on your skin by washing with CHG (chlorahexidine gluconate) soap before surgery.   CHG is an antiseptic cleaner which kills germs and bonds with the skin to continue killing germs even after washing. Please DO NOT use if you have an allergy to CHG or antibacterial soaps.   If your skin becomes reddened/irritated stop using the CHG and inform your nurse when you arrive at Short Stay.   You may shave your face/neck. Please follow these instructions carefully :  1.  Shower with CHG Soap the night before surgery and the  morning of Surgery.  2.  If you choose to wash your hair, wash your hair first as usual with your  normal  shampoo.  3.  After you shampoo, rinse your hair and body thoroughly to remove the  shampoo.  4.  Use CHG as you would any other liquid soap.  You can apply chg directly  to the skin and wash                       Gently with a scrungie or clean washcloth.  5.  Apply the CHG Soap to your body ONLY FROM THE NECK DOWN.   Do not use on face/ open                           Wound or open sores. Avoid contact with eyes, ears mouth and genitals (private parts).                       Wash face,  Genitals (private parts) with your normal soap.             6.  Wash thoroughly, paying special attention to the area where your surgery  will be performed.  7.  Thoroughly rinse your body with warm water from the neck down.  8.  DO NOT shower/wash with your normal soap after using and rinsing off  the CHG Soap.                9.  Pat yourself dry with a clean towel.            10.  Wear clean pajamas.            11.  Place clean sheets on your bed the night of your first shower and do not  sleep with pets. Day of Surgery : Do not apply any lotions/deodorants the morning of surgery.  Please wear clean clothes to the hospital/surgery center.  FAILURE TO FOLLOW THESE INSTRUCTIONS MAY RESULT IN THE CANCELLATION OF YOUR SURGERY PATIENT  SIGNATURE_________________________________  NURSE SIGNATURE__________________________________  ________________________________________________________________________   Adam Phenix  An incentive spirometer is a tool that can help keep your lungs clear and active. This tool measures how well you are filling your lungs with each breath. Taking long deep breaths may help reverse or decrease the chance of developing breathing (pulmonary) problems (especially infection) following:  A long period of time when you are unable to move or be active. BEFORE THE PROCEDURE   If the spirometer includes an indicator to show your best effort, your nurse or respiratory therapist will set it to a desired goal.  If possible, sit up straight or lean slightly forward. Try not to slouch.  Hold the incentive spirometer in an upright position. INSTRUCTIONS FOR USE  1. Sit on the edge of your bed if possible, or sit up as far as you can in bed or on a chair. 2. Hold the incentive spirometer in an upright position. 3. Breathe out normally. 4. Place the mouthpiece in your mouth and seal your lips tightly around it. 5. Breathe in slowly and as deeply as possible, raising the piston or the ball toward the top of the column. 6. Hold your breath for 3-5 seconds or for as long as possible. Allow the piston or ball to fall to the bottom of the column. 7. Remove the mouthpiece from your mouth and breathe out normally. 8. Rest for a few seconds and repeat Steps 1 through 7 at least 10 times every 1-2 hours when you are awake. Take your time and take a few normal breaths between deep breaths. 9. The spirometer may include an indicator to show  your best effort. Use the indicator as a goal to work toward during each repetition. 10. After each set of 10 deep breaths, practice coughing to be sure your lungs are clear. If you have an incision (the cut made at the time of surgery), support your incision when coughing  by placing a pillow or rolled up towels firmly against it. Once you are able to get out of bed, walk around indoors and cough well. You may stop using the incentive spirometer when instructed by your caregiver.  RISKS AND COMPLICATIONS  Take your time so you do not get dizzy or light-headed.  If you are in pain, you may need to take or ask for pain medication before doing incentive spirometry. It is harder to take a deep breath if you are having pain. AFTER USE  Rest and breathe slowly and easily.  It can be helpful to keep track of a log of your progress. Your caregiver can provide you with a simple table to help with this. If you are using the spirometer at home, follow these instructions: [place] IF:   You are having difficultly using the spirometer.  You have trouble using the spirometer as often as instructed.  Your pain medication is not giving enough relief while using the spirometer.  You develop fever of 100.5 F (38.1 C) or higher. SEEK IMMEDIATE MEDICAL CARE IF:   You cough up bloody sputum that had not been present before.  You develop fever of 102 F (38.9 C) or greater.  You develop worsening pain at or near the incision site. MAKE SURE YOU:   Understand these instructions.  Will watch your condition.  Will get help right away if you are not doing well or get worse. Document Released: 05/21/2006 Document Revised: 04/02/2011 Document Reviewed: 07/22/2006 [facility] Patient Information 2014 [place], [place].   ________________________________________________________________________

## 2018-11-12 ENCOUNTER — Encounter (HOSPITAL_COMMUNITY)
Admission: RE | Admit: 2018-11-12 | Discharge: 2018-11-12 | Disposition: A | Discharge status: Home / Self Care | Payer: PPO | Source: Ambulatory Visit | Attending: Orthopaedic Surgery | Admitting: Orthopaedic Surgery

## 2018-11-12 ENCOUNTER — Encounter (HOSPITAL_COMMUNITY): Payer: Self-pay

## 2018-11-12 ENCOUNTER — Other Ambulatory Visit: Payer: Self-pay

## 2018-11-12 DIAGNOSIS — M25512 Pain in left shoulder: Secondary | ICD-10-CM | POA: Diagnosis not present

## 2018-11-12 DIAGNOSIS — Z01812 Encounter for preprocedural laboratory examination: Secondary | ICD-10-CM | POA: Diagnosis not present

## 2018-11-12 LAB — SURGICAL PCR SCREEN
MRSA, PCR: NEGATIVE
Staphylococcus aureus: NEGATIVE

## 2018-11-12 LAB — BASIC METABOLIC PANEL
Anion gap: 11 (ref 5–15)
BUN: 20 mg/dL (ref 8–23)
CO2: 26 mmol/L (ref 22–32)
Calcium: 9.6 mg/dL (ref 8.9–10.3)
Chloride: 102 mmol/L (ref 98–111)
Creatinine, Ser: 0.93 mg/dL (ref 0.61–1.24)
GFR calc Af Amer: >60 mL/min (ref >60)
GFR calc non Af Amer: >60 mL/min (ref >60)
Glucose, Bld: 110 mg/dL — ABNORMAL HIGH (ref 70–99)
Potassium: 4.2 mmol/L (ref 3.5–5.1)
Sodium: 139 mmol/L (ref 135–145)

## 2018-11-12 LAB — CBC
HCT: 37.8 % — ABNORMAL LOW (ref 39.0–52.0)
Hemoglobin: 12.1 g/dL — ABNORMAL LOW (ref 13.0–17.0)
MCH: 34.3 pg — ABNORMAL HIGH (ref 26.0–34.0)
MCHC: 32 g/dL (ref 30.0–36.0)
MCV: 107.1 fL — ABNORMAL HIGH (ref 80.0–100.0)
Platelets: 188 10*3/uL (ref 150–400)
RBC: 3.53 MIL/uL — ABNORMAL LOW (ref 4.22–5.81)
RDW: 14.5 % (ref 11.5–15.5)
WBC: 6.2 10*3/uL (ref 4.0–10.5)
nRBC: 0 % (ref 0.0–0.2)

## 2018-11-12 NOTE — Progress Notes (Signed)
PCP - Dr. French Ana Cardiologist - Dr. Elliot Cousin  Chest x-ray - 2017 EKG - 03/27/18 Stress Test - no ECHO - 12/04/15  Last visit Dr. Michela Pitcher he couldn't hear a murmur Cardiac Cath - no  Sleep Study - no CPAP -   Fasting Blood Sugar - NA Checks Blood Sugar _____ times a day  Blood Thinner Instructions:ASA,  Dr. Curt Bears Aspirin Instructions:intructed to stop it 5 day prior to surgery Last Dose:11/14/18  Anesthesia review:   Patient denies shortness of breath, fever, cough and chest pain at PAT appointment yes  Patient verbalized understanding of instructions that were given to them at the PAT appointment. Patient was also instructed that they will need to review over the PAT instructions again at home before surgery. yes

## 2018-11-13 ENCOUNTER — Other Ambulatory Visit: Payer: Self-pay | Admitting: Internal Medicine

## 2018-11-13 ENCOUNTER — Telehealth: Payer: Self-pay | Admitting: Internal Medicine

## 2018-11-13 MED ORDER — METHOCARBAMOL 500 MG PO TABS: 500.0000 mg | ORAL_TABLET | Freq: Three times a day (TID) | ORAL | 1 refills | Status: DC | PRN

## 2018-11-13 NOTE — Telephone Encounter (Signed)
methocarbamol (ROBAXIN) 500 MG tablet  Pt requesting quantity of 90 instead of 60.  Please advise.

## 2018-11-13 NOTE — Telephone Encounter (Signed)
Routed incorrectly  °

## 2018-11-13 NOTE — H&P (Signed)
PREOPERATIVE H&P  Chief Complaint: DJD LEFT SHOULDER, failed previous rotator cuff repair  HPI: Gene Hill is a 78 y.o. male who presents for preoperative history and physical prior to scheduled surgery, REVERSE SHOULDER ARTHROPLASTY.   Patient has a past medical history significant for coronary artery disease, HTN, hyperlipidemia, Barrett's esophagus, Multiple myeloma, and peptic ulcer disease.   Patient had a right shoulder reverse total shoulder arthroplasty for cuff insufficiency on 06-25-2018. He did great following surgery on his right shoulder. He began to notice pain and weakness in his left shoulder. Patient had a previous rotator cuff repair on his left side by Dr. French Ana on 02/06/17. His pain and weakness started to affect his normal activities. 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   Patient's symptoms are rated as moderate to severe, and have been worsening. This is significantly impairing his activities of daily living.    Please see clinic note for further details on this patient's care.    He has elected for surgical management.   Past Medical History:  Diagnosis Date  . Arthritis   . 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 Blood dyscrasia    low WBC d/t multiple myeloma  . Bulging lumbar disc    L2-3  . Carotid bruit    3-11 carotid u/s was  (-)  . 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 GERD (gastroesophageal reflux disease)   . Headache   . Heart murmur    doesn't have one  . HYPERLIPIDEMIA 05/03/2006  . HYPERTENSION 05/03/2006  . 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 Torn rotator cuff 01/22/2017   left- [place]   Past Surgical History:   Procedure Laterality Date  . BACK SURGERY    . CERVICAL FUSION    . IR FLUORO GUIDE PORT INSERTION RIGHT  11/29/2016  . IR US GUIDE VASC ACCESS RIGHT  11/29/2016  . [place]   right  . REVERSE SHOULDER ARTHROPLASTY Right 06/25/2018   Procedure: REVERSE SHOULDER ARTHROPLASTY;  Surgeon: Hiram Gash, MD;  Location: [facility];  Service: Orthopedics;  Laterality: Right;  . ROTATOR CUFF REPAIR     right  . SPINAL FUSION     lumbar  . TOTAL KNEE ARTHROPLASTY Right 12/23/2015   Procedure: TOTAL KNEE ARTHROPLASTY;  Surgeon: Earlie Server, MD;  Location: [facility];  Service: Orthopedics;  Laterality: Right;   Social History   Socioeconomic History  . Marital status: Married    Spouse name: Not on file  . Number of children: 4  . Years of education: Not on file  . Highest education level: Not on file  Occupational History  . Occupation: retired  Scientific laboratory technician  . Financial resource strain: Not on file  . Food insecurity    Worry: Not on file    Inability: Not on file  . Transportation needs    Medical: Not on file    Non-medical: Not on file  Tobacco Use  . Smoking status: Former Smoker    Packs/day: 0.25    Years: 20.00    Pack years: 5.00    Types: Cigarettes    Start date: 04/28/1954  Quit date: 11/28/1974    Years since quitting: 43.9  . Smokeless tobacco: Never Used  . Tobacco comment: quit 35 years ago, 1976  Substance and Sexual Activity  . Alcohol use: Yes    Alcohol/week: 0.0 standard drinks    Comment: occ  . Drug use: No  . Sexual activity: Never  Lifestyle  . Physical activity    Days per week: Not on file    Minutes per session: Not on file  . Stress: Not on file  Relationships  . Social Herbalist on phone: Not on file    Gets together: Not on file    Attends religious service: Not on file    Active member of club or organization: Not on file    Attends meetings of clubs or organizations: Not on file    Relationship  status: Not on file  Other Topics Concern  . Not on file  Social History Narrative   Household-- pt, wife, son   Family History  Problem Relation Age of Onset  . Kidney cancer Father   . Leukemia Mother   . Pulmonary embolism Brother   . Stroke Maternal Grandmother   . Heart attack Maternal Grandfather   . Stroke Paternal Grandmother   . Cerebral palsy Son   . Prostate cancer Neg Hx   . Colon cancer Neg Hx   . Esophageal cancer Neg Hx   . Rectal cancer Neg Hx   . Stomach cancer Neg Hx    No Known Allergies Prior to Admission medications   Medication Sig Start Date End Date Taking? Authorizing Provider  aspirin 325 MG tablet Take 325 mg by mouth daily.   Yes [provider]  atorvastatin (LIPITOR) 10 MG tablet Take 1 tablet (10 mg total) by mouth at bedtime. 05/16/18  Yes Paz, Alda Berthold, MD  b complex vitamins tablet Take 1 tablet by mouth daily.   Yes [provider]  calcium carbonate (OSCAL) 1500 (600 Ca) MG TABS tablet Take 600 mg of elemental calcium by mouth daily with breakfast.    Yes [provider]  clindamycin (CLEOCIN) 150 MG capsule Take 150 mg by mouth as needed. Take four capsules total of 600 mg one hour before dental work. 10/23/18  Yes [provider]  Cyanocobalamin (VITAMIN B-12 PO) Take 5,000 mcg by mouth daily.    Yes [provider]  Daratumumab (DARZALEX IV) Inject into the vein every 6 (six) weeks.    Yes [provider]  dexamethasone (DECADRON) 2 MG tablet TAKE 2 TABLETS BY MOUTH DAY AFTER CHEMO THEN 1 TABLET EVERY DAY FOR 4 DAYS Patient taking differently: Take 2-4 mg by mouth See admin instructions. TAKE 8 MG BY MOUTH DAY AFTER CHEMO THEN 2 MG EVERY DAY FOR 4 DAYS 10/30/17  Yes [name], MD  fluticasone (FLONASE) 50 MCG/ACT nasal spray Place 1 spray into both nostrils at bedtime.    Yes [provider]  metoprolol tartrate (LOPRESSOR) 100 MG tablet Take 0.5 tablets (50 mg total) by mouth 2  (two) times daily. 09/22/18  Yes Paz, Alda Berthold, MD  Multiple Vitamin (MULTIVITAMIN) tablet Take 1 tablet by mouth daily.   Yes [provider]  Omega-3 Fatty Acids (FISH OIL) 1200 MG CAPS Take 1,200 mg by mouth daily.   Yes [provider]  oxyCODONE (OXY IR/ROXICODONE) 5 MG immediate release tablet Take 1 tablet (5 mg total) by mouth every 6 (six) hours as needed for severe pain.  11/03/18  Yes [name], MD  pantoprazole (PROTONIX) 40 MG tablet Take 1 tablet (40 mg total) by mouth at bedtime. 09/22/18  Yes Paz, Alda Berthold, MD  Probiotic Product (PROBIOTIC PO) Take 1 tablet by mouth daily.    Yes [provider]  tamsulosin (FLOMAX) 0.4 MG CAPS capsule Take 0.4 mg by mouth daily.   Yes [provider]  methocarbamol (ROBAXIN) 500 MG tablet Take 1 tablet (500 mg total) by mouth 3 (three) times daily as needed for muscle spasms. 11/13/18   Colon Branch, MD    Positive ROS: All other systems have been reviewed and were otherwise negative with the exception of those mentioned in the HPI and as above.  Physical Exam: General: Alert, no acute distress Cardiovascular: No pedal edema Respiratory: No cyanosis, no use of accessory musculature GI: No organomegaly, abdomen is soft and non-tender Skin: No lesions in the area of chief complaint Neurologic: Sensation intact distally Psychiatric: Patient is competent for consent with normal mood and affect Lymphatic: No axillary or cervical lymphadenopathy  MUSCULOSKELETAL:  Left shoulder: forward flexion to 160 degrees. External rotation to 40 degrees. Internal rotation to T10. Weakness with abduction and external rotation. Axillary nerve firing. Distal motor and sensory function in tact. Warm well perfused hand.   Imaging: MRI of left shoulder demonstrates a massive rotator cuff tear with a complete subscapularis and infraspinatus recurrent tear after previous supraspinatus repair  Assessment: DJD LEFT SHOULDER in  setting of previous failed rotator cuff repair.   Plan: Plan for Procedure(s): REVERSE SHOULDER ARTHROPLASTY  Discussion was held with the patient regarding his massive rotator cuff tear with a complete subscapularis and infraspinatus recurrent tear after previous supraspinatus repair. He did well with the reverse total shoulder arthroplasty on the contralateral side.  At his age with his comorbidities we feel that reverse total shoulder arthroplasty would be reasonable and the patient agrees.   Patient had right reverse total shoulder arthroplasty on 06/25/2018. He has previously obtained surgical clearance from his PCP and cardiologist earlier this year.  The risks benefits and alternatives were discussed with the patient including but not limited to the risks of nonoperative treatment, versus surgical intervention including infection, bleeding, nerve injury,  blood clots, cardiopulmonary complications, morbidity, mortality, among others, and they were willing to proceed.   We additionally specifically discussed risks of axillary nerve injury, infection, periprosthetic fracture, continued pain and longevity of implants prior to beginning procedure.    The patient acknowledged the explanation, agreed to proceed with the plan and consent was signed.    Patient will be admitted for inpatient treatment for surgery, pain control, PT and/or OT, prophylactic antibiotics, VTE prophylaxis, and discharge planning. The patient is planning to be discharged home with outpatient PT.   The patient acknowledged the explanation, agreed to proceed with the plan and consent was signed.   Plan: left reverse total shoulder arthroplasty Discharge Medications: Tylenol, Celebrex (history of PUD), Oxycodone DVT Prophylaxis: None required, Resume Aspirin Post-Op day #1 Physical Therapy: Outpatient PT. Start date pending on surgery/bone quality.    Maylene Roes [place], [place]  11/13/2018 10:53 AM

## 2018-11-13 NOTE — Telephone Encounter (Signed)
Quantity updated.

## 2018-11-15 ENCOUNTER — Other Ambulatory Visit (HOSPITAL_COMMUNITY)
Admission: RE | Admit: 2018-11-15 | Discharge: 2018-11-15 | Disposition: A | Discharge status: Home / Self Care | Payer: PPO | Source: Ambulatory Visit | Attending: Orthopaedic Surgery | Admitting: Orthopaedic Surgery

## 2018-11-15 DIAGNOSIS — Z01812 Encounter for preprocedural laboratory examination: Secondary | ICD-10-CM | POA: Insufficient documentation

## 2018-11-15 DIAGNOSIS — Z20828 Contact with and (suspected) exposure to other viral communicable diseases: Secondary | ICD-10-CM | POA: Diagnosis not present

## 2018-11-16 LAB — NOVEL CORONAVIRUS, NAA (HOSP ORDER, SEND-OUT TO REF LAB; TAT 18-24 HRS): SARS-CoV-2, NAA: NOT DETECTED

## 2018-11-19 ENCOUNTER — Encounter (HOSPITAL_COMMUNITY)
Admission: RE | Disposition: A | Discharge status: Home / Self Care | Payer: Self-pay | Source: Home / Self Care | Attending: Orthopaedic Surgery

## 2018-11-19 ENCOUNTER — Inpatient Hospital Stay (HOSPITAL_COMMUNITY): Payer: PPO

## 2018-11-19 ENCOUNTER — Inpatient Hospital Stay (HOSPITAL_COMMUNITY)
Admission: RE | Admit: 2018-11-19 | Discharge: 2018-11-20 | DRG: 483 | Disposition: A | Discharge status: Home / Self Care | Payer: PPO | Attending: Orthopaedic Surgery | Admitting: Orthopaedic Surgery

## 2018-11-19 ENCOUNTER — Inpatient Hospital Stay (HOSPITAL_COMMUNITY): Payer: PPO | Admitting: Certified Registered Nurse Anesthetist

## 2018-11-19 ENCOUNTER — Other Ambulatory Visit: Payer: Self-pay

## 2018-11-19 ENCOUNTER — Encounter (HOSPITAL_COMMUNITY): Payer: Self-pay | Admitting: *Deleted

## 2018-11-19 DIAGNOSIS — Z20828 Contact with and (suspected) exposure to other viral communicable diseases: Secondary | ICD-10-CM | POA: Diagnosis not present

## 2018-11-19 DIAGNOSIS — Z8711 Personal history of peptic ulcer disease: Secondary | ICD-10-CM | POA: Diagnosis not present

## 2018-11-19 DIAGNOSIS — Z806 Family history of leukemia: Secondary | ICD-10-CM | POA: Diagnosis not present

## 2018-11-19 DIAGNOSIS — K2270 Barrett's esophagus without dysplasia: Secondary | ICD-10-CM | POA: Diagnosis present

## 2018-11-19 DIAGNOSIS — Z96611 Presence of right artificial shoulder joint: Secondary | ICD-10-CM | POA: Diagnosis not present

## 2018-11-19 DIAGNOSIS — E785 Hyperlipidemia, unspecified: Secondary | ICD-10-CM | POA: Diagnosis present

## 2018-11-19 DIAGNOSIS — Z7982 Long term (current) use of aspirin: Secondary | ICD-10-CM

## 2018-11-19 DIAGNOSIS — Z79899 Other long term (current) drug therapy: Secondary | ICD-10-CM | POA: Diagnosis not present

## 2018-11-19 DIAGNOSIS — I10 Essential (primary) hypertension: Secondary | ICD-10-CM | POA: Diagnosis present

## 2018-11-19 DIAGNOSIS — Z09 Encounter for follow-up examination after completed treatment for conditions other than malignant neoplasm: Secondary | ICD-10-CM

## 2018-11-19 DIAGNOSIS — C9000 Multiple myeloma not having achieved remission: Secondary | ICD-10-CM | POA: Diagnosis not present

## 2018-11-19 DIAGNOSIS — M75122 Complete rotator cuff tear or rupture of left shoulder, not specified as traumatic: Secondary | ICD-10-CM | POA: Diagnosis not present

## 2018-11-19 DIAGNOSIS — G8918 Other acute postprocedural pain: Secondary | ICD-10-CM | POA: Diagnosis not present

## 2018-11-19 DIAGNOSIS — Z87891 Personal history of nicotine dependence: Secondary | ICD-10-CM | POA: Diagnosis not present

## 2018-11-19 DIAGNOSIS — Z8249 Family history of ischemic heart disease and other diseases of the circulatory system: Secondary | ICD-10-CM

## 2018-11-19 DIAGNOSIS — M75102 Unspecified rotator cuff tear or rupture of left shoulder, not specified as traumatic: Secondary | ICD-10-CM | POA: Diagnosis not present

## 2018-11-19 DIAGNOSIS — I2510 Atherosclerotic heart disease of native coronary artery without angina pectoris: Secondary | ICD-10-CM | POA: Diagnosis present

## 2018-11-19 DIAGNOSIS — Z823 Family history of stroke: Secondary | ICD-10-CM

## 2018-11-19 DIAGNOSIS — Z471 Aftercare following joint replacement surgery: Secondary | ICD-10-CM | POA: Diagnosis not present

## 2018-11-19 DIAGNOSIS — M12812 Other specific arthropathies, not elsewhere classified, left shoulder: Secondary | ICD-10-CM | POA: Diagnosis not present

## 2018-11-19 DIAGNOSIS — M25712 Osteophyte, left shoulder: Secondary | ICD-10-CM | POA: Diagnosis present

## 2018-11-19 DIAGNOSIS — R519 Headache, unspecified: Secondary | ICD-10-CM | POA: Diagnosis present

## 2018-11-19 DIAGNOSIS — Z96651 Presence of right artificial knee joint: Secondary | ICD-10-CM | POA: Diagnosis present

## 2018-11-19 DIAGNOSIS — M19012 Primary osteoarthritis, left shoulder: Principal | ICD-10-CM | POA: Diagnosis present

## 2018-11-19 DIAGNOSIS — Z981 Arthrodesis status: Secondary | ICD-10-CM | POA: Diagnosis not present

## 2018-11-19 DIAGNOSIS — M65812 Other synovitis and tenosynovitis, left shoulder: Secondary | ICD-10-CM | POA: Diagnosis present

## 2018-11-19 DIAGNOSIS — K219 Gastro-esophageal reflux disease without esophagitis: Secondary | ICD-10-CM | POA: Diagnosis not present

## 2018-11-19 DIAGNOSIS — Z96612 Presence of left artificial shoulder joint: Secondary | ICD-10-CM | POA: Diagnosis not present

## 2018-11-19 DIAGNOSIS — Z8051 Family history of malignant neoplasm of kidney: Secondary | ICD-10-CM

## 2018-11-19 HISTORY — PX: REVERSE SHOULDER ARTHROPLASTY: SHX5054

## 2018-11-19 SURGERY — ARTHROPLASTY, SHOULDER, TOTAL, REVERSE
Anesthesia: General | Site: Shoulder | Laterality: Left

## 2018-11-19 MED ORDER — CEFAZOLIN SODIUM-DEXTROSE 2-4 GM/100ML-% IV SOLN
2.0000 g | INTRAVENOUS | Status: AC
  Administered 2018-11-19: 2 g via INTRAVENOUS
  Filled 2018-11-19: qty 100

## 2018-11-19 MED ORDER — FENTANYL CITRATE (PF) 100 MCG/2ML IJ SOLN
25.0000 ug | INTRAMUSCULAR | Status: DC | PRN
  Administered 2018-11-19 (×2): 50 ug via INTRAVENOUS

## 2018-11-19 MED ORDER — BISACODYL 10 MG RE SUPP: 10.0000 mg | Freq: Every day | RECTAL | Status: DC | PRN

## 2018-11-19 MED ORDER — DOCUSATE SODIUM 100 MG PO CAPS
100.0000 mg | ORAL_CAPSULE | Freq: Two times a day (BID) | ORAL | Status: DC
  Administered 2018-11-19 – 2018-11-20 (×2): 100 mg via ORAL
  Filled 2018-11-19 (×2): qty 1

## 2018-11-19 MED ORDER — CALCIUM CARBONATE ANTACID 500 MG PO CHEW: 1.0000 | CHEWABLE_TABLET | ORAL | Status: DC | PRN

## 2018-11-19 MED ORDER — OXYCODONE HCL 5 MG PO TABS
5.0000 mg | ORAL_TABLET | Freq: Once | ORAL | Status: AC | PRN
  Administered 2018-11-19: 10:00:00 5 mg via ORAL

## 2018-11-19 MED ORDER — LIDOCAINE 2% (20 MG/ML) 5 ML SYRINGE
INTRAMUSCULAR | Status: DC | PRN
  Administered 2018-11-19: 80 mg via INTRAVENOUS

## 2018-11-19 MED ORDER — CELECOXIB 200 MG PO CAPS
200.0000 mg | ORAL_CAPSULE | Freq: Two times a day (BID) | ORAL | Status: DC
  Administered 2018-11-19 – 2018-11-20 (×3): 200 mg via ORAL
  Filled 2018-11-19 (×3): qty 1

## 2018-11-19 MED ORDER — HYDROMORPHONE HCL 1 MG/ML IJ SOLN: 0.5000 mg | INTRAMUSCULAR | Status: DC | PRN

## 2018-11-19 MED ORDER — ONDANSETRON HCL 4 MG/2ML IJ SOLN
INTRAMUSCULAR | Status: AC
  Filled 2018-11-19: qty 2

## 2018-11-19 MED ORDER — BUPIVACAINE LIPOSOME 1.3 % IJ SUSP
INTRAMUSCULAR | Status: DC | PRN
  Administered 2018-11-19: 10 mL via PERINEURAL

## 2018-11-19 MED ORDER — DEXAMETHASONE SODIUM PHOSPHATE 10 MG/ML IJ SOLN
INTRAMUSCULAR | Status: DC | PRN
  Administered 2018-11-19: 5 mg via INTRAVENOUS

## 2018-11-19 MED ORDER — DIPHENHYDRAMINE HCL 12.5 MG/5ML PO ELIX: 12.5000 mg | ORAL_SOLUTION | ORAL | Status: DC | PRN

## 2018-11-19 MED ORDER — FLUTICASONE PROPIONATE 50 MCG/ACT NA SUSP
1.0000 | Freq: Every day | NASAL | Status: DC
  Filled 2018-11-19: qty 16

## 2018-11-19 MED ORDER — METOCLOPRAMIDE HCL 5 MG PO TABS
5.0000 mg | ORAL_TABLET | Freq: Three times a day (TID) | ORAL | Status: DC | PRN
  Filled 2018-11-19: qty 2

## 2018-11-19 MED ORDER — PANTOPRAZOLE SODIUM 40 MG PO TBEC
40.0000 mg | DELAYED_RELEASE_TABLET | Freq: Every day | ORAL | Status: DC
  Administered 2018-11-19: 40 mg via ORAL
  Filled 2018-11-19: qty 1

## 2018-11-19 MED ORDER — TAMSULOSIN HCL 0.4 MG PO CAPS
0.4000 mg | ORAL_CAPSULE | Freq: Every day | ORAL | Status: DC
  Administered 2018-11-19 – 2018-11-20 (×2): 0.4 mg via ORAL
  Filled 2018-11-19 (×2): qty 1

## 2018-11-19 MED ORDER — FENTANYL CITRATE (PF) 100 MCG/2ML IJ SOLN
INTRAMUSCULAR | Status: AC
  Filled 2018-11-19: qty 2

## 2018-11-19 MED ORDER — PROPOFOL 10 MG/ML IV BOLUS
INTRAVENOUS | Status: DC | PRN
  Administered 2018-11-19: 150 mg via INTRAVENOUS

## 2018-11-19 MED ORDER — PHENYLEPHRINE HCL-NACL 10-0.9 MG/250ML-% IV SOLN
INTRAVENOUS | Status: DC | PRN
  Administered 2018-11-19: 50 ug/min via INTRAVENOUS

## 2018-11-19 MED ORDER — OXYCODONE HCL 5 MG PO TABS
5.0000 mg | ORAL_TABLET | ORAL | Status: DC | PRN
  Administered 2018-11-19: 21:00:00 5 mg via ORAL
  Filled 2018-11-19: qty 1

## 2018-11-19 MED ORDER — SUGAMMADEX SODIUM 200 MG/2ML IV SOLN
INTRAVENOUS | Status: DC | PRN
  Administered 2018-11-19: 200 mg via INTRAVENOUS

## 2018-11-19 MED ORDER — CHLORHEXIDINE GLUCONATE 4 % EX LIQD: 60.0000 mL | Freq: Once | CUTANEOUS | Status: DC

## 2018-11-19 MED ORDER — EPHEDRINE SULFATE-NACL 50-0.9 MG/10ML-% IV SOSY
PREFILLED_SYRINGE | INTRAVENOUS | Status: DC | PRN
  Administered 2018-11-19 (×3): 10 mg via INTRAVENOUS

## 2018-11-19 MED ORDER — ZOLPIDEM TARTRATE 5 MG PO TABS
5.0000 mg | ORAL_TABLET | Freq: Every evening | ORAL | Status: DC | PRN
  Filled 2018-11-19: qty 1

## 2018-11-19 MED ORDER — DEXAMETHASONE SODIUM PHOSPHATE 10 MG/ML IJ SOLN
INTRAMUSCULAR | Status: AC
  Filled 2018-11-19: qty 1

## 2018-11-19 MED ORDER — ONDANSETRON HCL 4 MG/2ML IJ SOLN: 4.0000 mg | Freq: Four times a day (QID) | INTRAMUSCULAR | Status: DC | PRN

## 2018-11-19 MED ORDER — METOPROLOL TARTRATE 50 MG PO TABS
50.0000 mg | ORAL_TABLET | Freq: Two times a day (BID) | ORAL | Status: DC
  Administered 2018-11-19 – 2018-11-20 (×2): 50 mg via ORAL
  Filled 2018-11-19 (×2): qty 1

## 2018-11-19 MED ORDER — ONDANSETRON HCL 4 MG PO TABS
4.0000 mg | ORAL_TABLET | Freq: Four times a day (QID) | ORAL | Status: DC | PRN
  Filled 2018-11-19: qty 1

## 2018-11-19 MED ORDER — ATORVASTATIN CALCIUM 10 MG PO TABS
10.0000 mg | ORAL_TABLET | Freq: Every day | ORAL | Status: DC
  Administered 2018-11-19: 21:00:00 10 mg via ORAL
  Filled 2018-11-19: qty 1

## 2018-11-19 MED ORDER — ACETAMINOPHEN 500 MG PO TABS
1000.0000 mg | ORAL_TABLET | Freq: Three times a day (TID) | ORAL | Status: DC
  Administered 2018-11-19 – 2018-11-20 (×3): 1000 mg via ORAL
  Filled 2018-11-19 (×3): qty 2

## 2018-11-19 MED ORDER — SUCCINYLCHOLINE CHLORIDE 200 MG/10ML IV SOSY
PREFILLED_SYRINGE | INTRAVENOUS | Status: DC | PRN
  Administered 2018-11-19: 120 mg via INTRAVENOUS

## 2018-11-19 MED ORDER — SUCCINYLCHOLINE CHLORIDE 200 MG/10ML IV SOSY
PREFILLED_SYRINGE | INTRAVENOUS | Status: AC
  Filled 2018-11-19: qty 10

## 2018-11-19 MED ORDER — METOCLOPRAMIDE HCL 5 MG/ML IJ SOLN: 5.0000 mg | Freq: Three times a day (TID) | INTRAMUSCULAR | Status: DC | PRN

## 2018-11-19 MED ORDER — 0.9 % SODIUM CHLORIDE (POUR BTL) OPTIME
TOPICAL | Status: DC | PRN
  Administered 2018-11-19: 09:00:00 1000 mL

## 2018-11-19 MED ORDER — PHENYLEPHRINE 40 MCG/ML (10ML) SYRINGE FOR IV PUSH (FOR BLOOD PRESSURE SUPPORT)
PREFILLED_SYRINGE | INTRAVENOUS | Status: DC | PRN
  Administered 2018-11-19 (×3): 120 ug via INTRAVENOUS

## 2018-11-19 MED ORDER — TRANEXAMIC ACID-NACL 1000-0.7 MG/100ML-% IV SOLN
1000.0000 mg | INTRAVENOUS | Status: AC
  Administered 2018-11-19: 08:00:00 1000 mg via INTRAVENOUS
  Filled 2018-11-19: qty 100

## 2018-11-19 MED ORDER — ONDANSETRON HCL 4 MG/2ML IJ SOLN
INTRAMUSCULAR | Status: DC | PRN
  Administered 2018-11-19: 4 mg via INTRAVENOUS

## 2018-11-19 MED ORDER — VANCOMYCIN HCL 1 G IV SOLR
INTRAVENOUS | Status: DC | PRN
  Administered 2018-11-19: 1000 mg

## 2018-11-19 MED ORDER — OXYCODONE HCL 5 MG/5ML PO SOLN: 5.0000 mg | Freq: Once | ORAL | Status: AC | PRN

## 2018-11-19 MED ORDER — PHENOL 1.4 % MT LIQD: 1.0000 | OROMUCOSAL | Status: DC | PRN

## 2018-11-19 MED ORDER — PROPOFOL 10 MG/ML IV BOLUS
INTRAVENOUS | Status: AC
  Filled 2018-11-19: qty 20

## 2018-11-19 MED ORDER — MENTHOL 3 MG MT LOZG: 1.0000 | LOZENGE | OROMUCOSAL | Status: DC | PRN

## 2018-11-19 MED ORDER — FENTANYL CITRATE (PF) 100 MCG/2ML IJ SOLN
INTRAMUSCULAR | Status: DC | PRN
  Administered 2018-11-19 (×2): 50 ug via INTRAVENOUS

## 2018-11-19 MED ORDER — POLYETHYLENE GLYCOL 3350 17 G PO PACK: 17.0000 g | PACK | Freq: Every day | ORAL | Status: DC | PRN

## 2018-11-19 MED ORDER — VANCOMYCIN HCL 1000 MG IV SOLR
INTRAVENOUS | Status: AC
  Filled 2018-11-19: qty 1000

## 2018-11-19 MED ORDER — ROCURONIUM BROMIDE 50 MG/5ML IV SOSY
PREFILLED_SYRINGE | INTRAVENOUS | Status: DC | PRN
  Administered 2018-11-19: 50 mg via INTRAVENOUS
  Administered 2018-11-19 (×2): 10 mg via INTRAVENOUS

## 2018-11-19 MED ORDER — LIDOCAINE 2% (20 MG/ML) 5 ML SYRINGE
INTRAMUSCULAR | Status: AC
  Filled 2018-11-19: qty 5

## 2018-11-19 MED ORDER — ROCURONIUM BROMIDE 10 MG/ML (PF) SYRINGE
PREFILLED_SYRINGE | INTRAVENOUS | Status: AC
  Filled 2018-11-19: qty 10

## 2018-11-19 MED ORDER — LACTATED RINGERS IV SOLN
INTRAVENOUS | Status: DC
  Administered 2018-11-19: 06:00:00 via INTRAVENOUS

## 2018-11-19 MED ORDER — OXYCODONE HCL 5 MG PO TABS
ORAL_TABLET | ORAL | Status: AC
  Filled 2018-11-19: qty 1

## 2018-11-19 MED ORDER — BUPIVACAINE-EPINEPHRINE (PF) 0.5% -1:200000 IJ SOLN
INTRAMUSCULAR | Status: DC | PRN
  Administered 2018-11-19: 15 mL via PERINEURAL

## 2018-11-19 MED ORDER — CEFAZOLIN SODIUM-DEXTROSE 1-4 GM/50ML-% IV SOLN
1.0000 g | Freq: Four times a day (QID) | INTRAVENOUS | Status: AC
  Administered 2018-11-19 – 2018-11-20 (×3): 1 g via INTRAVENOUS
  Filled 2018-11-19 (×3): qty 50

## 2018-11-19 MED ORDER — MAGNESIUM CITRATE PO SOLN: 1.0000 | Freq: Once | ORAL | Status: DC | PRN

## 2018-11-19 MED ORDER — STERILE WATER FOR IRRIGATION IR SOLN
Status: DC | PRN
  Administered 2018-11-19: 2000 mL

## 2018-11-19 MED ORDER — SODIUM CHLORIDE 0.9 % IR SOLN
Status: DC | PRN
  Administered 2018-11-19: 1000 mL

## 2018-11-19 SURGICAL SUPPLY — 73 items
AID PSTN UNV HD RSTRNT DISP (MISCELLANEOUS) ×1
APL PRP STRL LF DISP 70% ISPRP (MISCELLANEOUS) ×2
BASEPLATE GLENOSPHERE 25 STD (Miscellaneous) ×1 IMPLANT
BASEPLATE GLENOSPHERE 25MM STD (Miscellaneous) ×1 IMPLANT
BIT DRILL 3.2 PERIPHERAL SCREW (BIT) ×2 IMPLANT
BLADE EXTENDED COATED 6.5IN (ELECTRODE) IMPLANT
BLADE SAW SAG 73X25 THK (BLADE) ×2
BLADE SAW SGTL 73X25 THK (BLADE) ×1 IMPLANT
BONE SCREW THREAD 6.5X35MM (Screw) ×1 IMPLANT
BSPLAT GLND STD 25 RVRS SHLDR (Miscellaneous) ×1 IMPLANT
CHLORAPREP W/TINT 26 (MISCELLANEOUS) ×6 IMPLANT
CLOSURE STERI-STRIP 1/2X4 (GAUZE/BANDAGES/DRESSINGS) ×1
CLSR STERI-STRIP ANTIMIC 1/2X4 (GAUZE/BANDAGES/DRESSINGS) ×2 IMPLANT
COOLER ICEMAN CLASSIC (MISCELLANEOUS) ×2 IMPLANT
COVER BACK TABLE 60X90IN (DRAPES) IMPLANT
COVER SURGICAL LIGHT HANDLE (MISCELLANEOUS) ×3 IMPLANT
COVER WAND RF STERILE (DRAPES) ×3 IMPLANT
DRAPE INCISE IOBAN 66X45 STRL (DRAPES) ×3 IMPLANT
DRAPE ORTHO SPLIT 77X108 STRL (DRAPES) ×6
DRAPE SHEET LG 3/4 BI-LAMINATE (DRAPES) ×5 IMPLANT
DRAPE SURG ORHT 6 SPLT 77X108 (DRAPES) ×2 IMPLANT
DRSG AQUACEL AG ADV 3.5X 6 (GAUZE/BANDAGES/DRESSINGS) ×3 IMPLANT
ELECT BLADE TIP CTD 4 INCH (ELECTRODE) ×3 IMPLANT
ELECT REM PT RETURN 15FT ADLT (MISCELLANEOUS) ×3 IMPLANT
GLENOSPHERE REV SHOULDER 36 (Joint) ×2 IMPLANT
GLOVE BIO SURGEON STRL SZ 6.5 (GLOVE) ×4 IMPLANT
GLOVE BIO SURGEONS STRL SZ 6.5 (GLOVE) ×2
GLOVE BIOGEL PI IND STRL 6.5 (GLOVE) ×1 IMPLANT
GLOVE BIOGEL PI IND STRL 8 (GLOVE) ×2 IMPLANT
GLOVE BIOGEL PI INDICATOR 6.5 (GLOVE) ×2
GLOVE BIOGEL PI INDICATOR 8 (GLOVE) ×4
GLOVE ECLIPSE 8.0 STRL XLNG CF (GLOVE) ×6 IMPLANT
GLOVE SURG ORTHO 8.0 STRL STRW (GLOVE) ×3 IMPLANT
GOWN SPEC L3 XXLG W/TWL (GOWN DISPOSABLE) ×3 IMPLANT
GOWN STRL REUS W/ TWL XL LVL3 (GOWN DISPOSABLE) ×1 IMPLANT
GOWN STRL REUS W/TWL LRG LVL3 (GOWN DISPOSABLE) ×3 IMPLANT
GOWN STRL REUS W/TWL XL LVL3 (GOWN DISPOSABLE) ×3
GUIDEWIRE GLENOID 2.5X220 (WIRE) ×2 IMPLANT
HANDPIECE INTERPULSE COAX TIP (DISPOSABLE) ×3
HEMOSTAT SURGICEL 2X14 (HEMOSTASIS) IMPLANT
IMPL REVERSE SHOULDER 0X3.5 (Shoulder) IMPLANT
IMPLANT REVERSE SHOULDER 0X3.5 (Shoulder) ×3 IMPLANT
INSERT HUMERAL 36X6MM 12.5DEG (Insert) ×2 IMPLANT
KIT BASIN OR (CUSTOM PROCEDURE TRAY) ×3 IMPLANT
KIT STABILIZATION SHOULDER (MISCELLANEOUS) ×3 IMPLANT
KIT TURNOVER KIT A (KITS) IMPLANT
MANIFOLD NEPTUNE II (INSTRUMENTS) ×3 IMPLANT
NDL HYPO 25X1 1.5 SAFETY (NEEDLE) IMPLANT
NDL MAYO CATGUT SZ4 TPR NDL (NEEDLE) IMPLANT
NEEDLE HYPO 25X1 1.5 SAFETY (NEEDLE) IMPLANT
NEEDLE MAYO CATGUT SZ4 (NEEDLE) IMPLANT
NS IRRIG 1000ML POUR BTL (IV SOLUTION) ×3 IMPLANT
PACK SHOULDER (CUSTOM PROCEDURE TRAY) ×3 IMPLANT
PAD COLD SHLDR WRAP-ON (PAD) ×2 IMPLANT
RESTRAINT HEAD UNIVERSAL NS (MISCELLANEOUS) ×3 IMPLANT
SCREW 5.0X38 SMALL F/PERFORM (Screw) ×2 IMPLANT
SCREW BONE THREAD 6.5X35 (Screw) ×1 IMPLANT
SCREW PERIPHERAL 5.0X34 (Screw) ×2 IMPLANT
SET HNDPC FAN SPRY TIP SCT (DISPOSABLE) ×1 IMPLANT
SLING ULTRA III MED (ORTHOPEDIC SUPPLIES) ×3 IMPLANT
SPONGE LAP 18X18 X RAY DECT (DISPOSABLE) IMPLANT
STEM HUMERAL AEQUALIS 5BX82 (Stem) ×2 IMPLANT
SUCTION FRAZIER HANDLE 12FR (TUBING) ×2
SUCTION TUBE FRAZIER 12FR DISP (TUBING) ×1 IMPLANT
SUT ETHIBOND 2 V 37 (SUTURE) ×3 IMPLANT
SUT ETHIBOND NAB CT1 #1 30IN (SUTURE) ×3 IMPLANT
SUT FIBERWIRE #5 38 CONV NDL (SUTURE) ×12
SUT MON AB 3-0 SH 27 (SUTURE) ×3
SUT MON AB 3-0 SH27 (SUTURE) ×1 IMPLANT
SUT VIC AB 0 CT1 36 (SUTURE) ×3 IMPLANT
SUTURE FIBERWR #5 38 CONV NDL (SUTURE) ×4 IMPLANT
TOWEL OR 17X26 10 PK STRL BLUE (TOWEL DISPOSABLE) ×3 IMPLANT
WATER STERILE IRR 1000ML POUR (IV SOLUTION) ×6 IMPLANT

## 2018-11-19 NOTE — Anesthesia Procedure Notes (Signed)
Anesthesia Regional Block: Interscalene brachial plexus block   Pre-Anesthetic Checklist: ,, timeout performed, Correct Patient, Correct Site, Correct Laterality, Correct Procedure, Correct Position, site marked, Risks and benefits discussed,  Surgical consent,  Pre-op evaluation,  At surgeon's request and post-op pain management  Laterality: Left  Prep: chloraprep       Needles:  Injection technique: Single-shot  Needle Type: Echogenic Stimulator Needle     Needle Length: 5cm  Needle Gauge: 22     Additional Needles:   Procedures:, nerve stimulator,,,,,,,   Nerve Stimulator or Paresthesia:  Response: biceps flexion, 0.45 mA,   Additional Responses:   Narrative:  Start time: 11/19/2018 7:07 AM End time: 11/19/2018 7:15 AM Injection made incrementally with aspirations every 5 mL.  Performed by: Personally  Anesthesiologist: Albertha Ghee, MD  Additional Notes: Functioning IV was confirmed and monitors were applied.  A 72mm 22ga Arrow echogenic stimulator needle was used. Sterile prep and drape,hand hygiene and sterile gloves were used.  Negative aspiration and negative test dose prior to incremental administration of local anesthetic. The patient tolerated the procedure well.  Ultrasound guidance: relevent anatomy identified, needle position confirmed, local anesthetic spread visualized around nerve(s), vascular puncture avoided.  Image printed for medical record.

## 2018-11-19 NOTE — Op Note (Addendum)
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  Operative Finding Successful completion of planned procedure.  Good fixation of baseplate with low position to ensure minimal chance of notching.  Happy with overall fixation.  Unable to really check axillary nerve due to subdeltoid and subcoracoid scarring which made this unsafe.  We stayed away from the nerve instead.  Post-operative plan: The patient will be NWB in sling.  The patient will be admitted overnight.  DVT prophylaxis with xarelto due to cancer history for 30 days.  Pain control with PRN pain medication preferring oral medicines.  Follow up plan will be scheduled in approximately 7 days for incision check and XR.  Physical therapy to start after first visit.  Implants: Tornier flex size 5 stem, high offset 0 tray, 6 mm poly-, reversed to 25 mm baseplate with 40 mm center screw, 36 glenosphere  Post-Op Diagnosis: Same Surgeons:Primary: Hiram Gash, MD Assistants:Caroline McBane PA-C Location: [facility] ROOM 06 Anesthesia: General with Exparel Interscalene Antibiotics: Ancef 2g preop, Vancomycin 1000mg  locally Tourniquet time: None Estimated Blood Loss: [id number] Complications: None Specimens: None Implants: Implant Name Type Inv. Item Serial No. Manufacturer Lot No. LRB No. Used Action  BASEPLATE GLENOSPHERE [id number] STD - [id number] Miscellaneous BASEPLATE GLENOSPHERE [id number] STD [id number] [employer]  Left 1 Implanted  BONE SCREW THREAD 6.5X35MM - [id number] Screw BONE SCREW THREAD 6.5X35MM  [employer]  Left 1 Implanted  SCREW PERIPHERAL 5.0X34 - [id number] Screw SCREW PERIPHERAL 5.0X34  [employer]  Left 1 Implanted  SCREW 5.0X38 SMALL F/PERFORM - [id number] Screw SCREW 5.0X38 SMALL F/PERFORM   [employer]  Left 1 Implanted  GLENOSPHERE REV SHOULDER 36 - [id number] Joint GLENOSPHERE REV SHOULDER 36 [id number] [employer]  Left 1 Implanted  STEM HUMERAL AEQUALIS 5BX82 - [id number] Stem STEM HUMERAL AEQUALIS 5BX82 [id number] [employer]  Left 1 Implanted  IMPLANT REVERSE SHOULDER 0X3.5 - [id number] Shoulder IMPLANT REVERSE SHOULDER 0X3.5 [id number] [employer]  Left 1 Implanted  INSERT HUMERAL 36X6MM 12.5DEG - [id number] Insert INSERT HUMERAL 36X6MM 12.5DEG [id number] [employer]  Left 1 Implanted    Indications for Surgery:   Gene Hill is a 78 y.o. male with cuff tear arthropathy and previous history of contralateral reverse social arthroplasty for similar presentation and did very well.  Benefits and risks of operative and nonoperative management were discussed prior to surgery with patient/guardian(s) and informed consent form was completed.  Infection and need for further surgery were discussed as was prosthetic stability and cuff issues.  We additionally specifically discussed risks of axillary nerve injury, infection, periprosthetic fracture, continued pain and longevity of implants prior to beginning procedure.      Procedure:   The patient was identified in the preoperative holding area where the surgical site was marked. Block placed by anesthesia with exparel.  The patient was taken to the OR where a procedural timeout was called and the above noted anesthesia was induced.  The patient was positioned beachchair on allen table with spider arm positioner.  Preoperative antibiotics were dosed.  The patient's left shoulder was prepped and draped in the usual sterile fashion.  A second preoperative timeout was called.       Standard deltopectoral approach was performed with a #10 blade. We dissected down to the subcutaneous tissues and the cephalic  vein was taken laterally with the deltoid. Clavipectoral fascia was incised in line with the incision. Deep retractors were placed.  The long of the biceps tendon was identified and there was significant tenosynovitis present.  Tenodesis was performed to the pectoralis tendon with #2 Ethibond. The remaining biceps was followed up into the rotator interval where it was released.   The subscapularis was taken down in a full thickness layer with capsule along the humeral neck extending inferiorly around the humeral head. We continued releasing the capsule directly off of the osteophytes inferiorly all the way around the corner. This allowed [place] to dislocate the humeral head.   The humeral head had evidence of severe osteoarthritic wear with full-thickness cartilage loss and exposed subchondral bone. There was significant flattening of the humeral head.   The rotator cuff was carefully examined and noted to be irreperably torn.  The decision was confirmed that a reverse total shoulder was indicated for this patient.  There were osteophytes along the inferior humeral neck. The osteophytes were removed with an osteotome and a rongeur.  Osteophytes were removed with a rongeur and an osteotome and the anatomic neck was well visualized.     A humeral cutting guide was inserted down the intramedullary canal. The version was set at 20 of retroversion. Humeral osteotomy was performed with an oscillating saw. The head fragment was passed off the back table.  We did note multiple anchors from his previous cuff repair that were removed the time of surgery.  This was necessary to facilitate placement of our implant.  A starter awl was used to open the humeral canal. We next used T-handle straight sound reamers to ream up to an appropriate fit. A chisel was used to remove proximal humeral bone. We then broached starting with a size one broach and broaching up to 5 which obtained an appropriate fit. The broach handle was removed. A cut protector was placed. The broach handle was removed and a cut protector was placed. The humerus was retracted  posteriorly and we turned our attention to glenoid exposure.   The subscapularis was again identified and immediately we took care to palpate the axillary nerve anteriorly and verify its position with gentle palpation as well as the tug test.  We then released the SGHL with bovie cautery prior to placing a curved mayo at the junction of the anterior glenoid well above the axillary nerve and bluntly dissecting the subscapularis from the capsule.  We then carefully protected the axillary nerve as we gently released the inferior capsule to fully mobilize the subscapularis.  An anterior deltoid retractor was then placed as well as a small Hohmann retractor superiorly.   The glenoid was inspected and had evidence of severe osteoarthritic wear with full-thickness cartilage loss and exposed subchondral bone.  There is significant overgrowth of the labrum and the site to carefully be resected.  The remaining labrum was removed circumferentially taking great care not to disrupt the posterior capsule.   The glenoid drill guide was placed and used to drill a guide pin in the center, inferior position. The glenoid face was then reamed concentrically over the guide wire. The center hole was drilled over the guidepin in a near anatomic angle of version. Next the  glenoid vault was drilled back to a depth of 40 mm.  We tapped and then placed a 46mm size baseplate with 43mm lateralization was selected with a 40 mm x 6.5 mm length central screw.  The base plate was  screwed into the glenoid vault obtaining secure fixation. We next placed superior and inferior locking screws for additional fixation.  Next a 36 mm glenosphere was selected and impacted onto the baseplate. The center screw was tightened.  We turned attention back to the humeral side. The cut protector was removed. We trialed with multiple size tray and polyethylene options and selected a 6 which provided good stability and range of motion without excess soft  tissue tension. The offset was dialed in to match the normal anatomy. The shoulder was trialed.  There was good ROM in all planes and the shoulder was stable with no inferior translation.  The real humeral implants were opened after again confirming sizes.  The trial was removed. #5 Fiberwire x4 sutures passed through the humeral neck for subscap repair. The humeral component was press-fit obtaining a secure fit. A +0 high offset tray was selected and impacted onto the stem.  A 36+6 polyethylene liner was impacted onto the stem.  The joint was reduced and thoroughly irrigated with pulsatile lavage. Subscap was repaired back with #5 Fiberwire sutures through bone tunnels. Hemostasis was obtained. The deltopectoral interval was reapproximated with #1 Ethibond. The subcutaneous tissues were closed with 2-0 Vicryl and the skin was closed with running monocryl.    The wounds were cleaned and dried and an Aquacel dressing was placed. The drapes taken down. The arm was placed into sling with abduction pillow. Patient was awakened, extubated, and transferred to the recovery room in stable condition. There were no intraoperative complications. The sponge, needle, and attention counts were  correct at the end of the case.        Noemi Chapel, PA-C, present and scrubbed throughout the case, critical for completion in a timely fashion, and for retraction, instrumentation, closure.

## 2018-11-19 NOTE — Transfer of Care (Signed)
Immediate Anesthesia Transfer of Care Note  Patient: Gene Hill  Procedure(s) Performed: REVERSE SHOULDER ARTHROPLASTY (Left Shoulder)  Patient Location: PACU  Anesthesia Type:General  Level of Consciousness: awake, alert  and oriented  Airway & Oxygen Therapy: Patient Spontanous Breathing and Patient connected to face mask oxygen  Post-op Assessment: Report given to RN and Post -op Vital signs reviewed and stable  Post vital signs: Reviewed and stable  Last Vitals:  Vitals Value Taken Time  BP 129/71 11/19/18 0920  Temp    Pulse 82 11/19/18 0921  Resp 21 11/19/18 0921  SpO2 100 % 11/19/18 0921  Vitals shown include unvalidated device data.  Last Pain:  Vitals:   11/19/18 0626  TempSrc: Oral      Patients Stated Pain Goal: 4 ([id number])  Complications: No apparent anesthesia complications

## 2018-11-19 NOTE — Interval H&P Note (Signed)
History and Physical Interval Note:  11/19/2018 7:13 AM  Gene Hill  has presented today for surgery, with the diagnosis of DJD LEFT SHOULDER.  The various methods of treatment have been discussed with the patient and family. After consideration of risks, benefits and other options for treatment, the patient has consented to  Procedure(s): REVERSE SHOULDER ARTHROPLASTY (Left) as a surgical intervention.  The patient's history has been reviewed, patient examined, no change in status, stable for surgery.  I have reviewed the patient's chart and labs.  Questions were answered to the patient's satisfaction.     Hiram Gash

## 2018-11-19 NOTE — Anesthesia Postprocedure Evaluation (Signed)
Anesthesia Post Note  Patient: Gene Hill  Procedure(s) Performed: REVERSE SHOULDER ARTHROPLASTY (Left Shoulder)     Patient location during evaluation: PACU Anesthesia Type: General and Regional Level of consciousness: awake and alert Pain management: pain level controlled Vital Signs Assessment: post-procedure vital signs reviewed and stable Respiratory status: spontaneous breathing, nonlabored ventilation, respiratory function stable and patient connected to nasal cannula oxygen Cardiovascular status: blood pressure returned to baseline and stable Postop Assessment: no apparent nausea or vomiting Anesthetic complications: no    Last Vitals:  Vitals:   11/19/18 1000 11/19/18 1027  BP: 127/78 131/89  Pulse: 80 82  Resp: 11 16  Temp:  36.8 C  SpO2: 98% 100%    Last Pain:  Vitals:   11/19/18 1027  TempSrc: Oral  PainSc:                  [place]

## 2018-11-19 NOTE — Anesthesia Preprocedure Evaluation (Signed)
Anesthesia Evaluation  Patient identified by MRN, date of birth, ID band Patient awake    Reviewed: Allergy & Precautions, H&P , NPO status , Patient's Chart, lab work & pertinent test results  Airway Mallampati: II   Neck ROM: full    Dental   Pulmonary former smoker,    breath sounds clear to auscultation       Cardiovascular hypertension, + CAD   Rhythm:regular Rate:Normal     Neuro/Psych  Headaches,    GI/Hepatic PUD, GERD  ,  Endo/Other  obese  Renal/GU      Musculoskeletal  (+) Arthritis ,   Abdominal   Peds  Hematology  (+) Blood dyscrasia, , Multiple myeloma   Anesthesia Other Findings   Reproductive/Obstetrics                             Anesthesia Physical Anesthesia Plan  ASA: III  Anesthesia Plan: General   Post-op Pain Management:  Regional for Post-op pain   Induction: Intravenous  PONV Risk Score and Plan: 2 and Ondansetron, Dexamethasone and Treatment may vary due to age or medical condition  Airway Management Planned: Oral ETT  Additional Equipment:   Intra-op Plan:   Post-operative Plan: Extubation in OR  Informed Consent: I have reviewed the patients History and Physical, chart, labs and discussed the procedure including the risks, benefits and alternatives for the proposed anesthesia with the patient or authorized representative who has indicated his/her understanding and acceptance.       Plan Discussed with: CRNA, Anesthesiologist and Surgeon  Anesthesia Plan Comments:         Anesthesia Quick Evaluation

## 2018-11-19 NOTE — Plan of Care (Signed)

## 2018-11-19 NOTE — Anesthesia Procedure Notes (Signed)
Procedure Name: Intubation Date/Time: 11/19/2018 7:31 AM Performed by: Maxwell Caul, CRNA Pre-anesthesia Checklist: Patient identified, Emergency Drugs available, Suction available and Patient being monitored Patient Re-evaluated:Patient Re-evaluated prior to induction Oxygen Delivery Method: Circle system utilized Preoxygenation: Pre-oxygenation with 100% oxygen Induction Type: IV induction Laryngoscope Size: Mac and 4 Grade View: Grade II Tube type: Oral Tube size: 7.5 mm Number of attempts: 1 Airway Equipment and Method: Stylet Placement Confirmation: ETT inserted through vocal cords under direct vision,  positive ETCO2 and breath sounds checked- equal and bilateral Secured at: 22 cm Tube secured with: Tape Dental Injury: Teeth and Oropharynx as per pre-operative assessment

## 2018-11-20 ENCOUNTER — Encounter (HOSPITAL_COMMUNITY): Payer: Self-pay | Admitting: Orthopaedic Surgery

## 2018-11-20 MED ORDER — CELECOXIB 200 MG PO CAPS: 200.0000 mg | ORAL_CAPSULE | Freq: Two times a day (BID) | ORAL | 1 refills | Status: AC

## 2018-11-20 MED ORDER — RIVAROXABAN 10 MG PO TABS: 10.0000 mg | ORAL_TABLET | Freq: Every day | ORAL | 0 refills | Status: DC

## 2018-11-20 MED ORDER — ACETAMINOPHEN 500 MG PO TABS: 1000.0000 mg | ORAL_TABLET | Freq: Three times a day (TID) | ORAL | 0 refills | Status: AC

## 2018-11-20 NOTE — Plan of Care (Signed)

## 2018-11-20 NOTE — Plan of Care (Signed)
  Problem: Education: Goal: Knowledge of General Education information will improve Description: Including pain rating scale, medication(s)/side effects and non-pharmacologic comfort measures Outcome: Adequate for Discharge   Problem: Health Behavior/Discharge Planning: Goal: Ability to manage health-related needs will improve Outcome: Adequate for Discharge   Problem: Clinical Measurements: Goal: Ability to maintain clinical measurements within normal limits will improve Outcome: Adequate for Discharge Goal: Will remain free from infection Outcome: Adequate for Discharge Goal: Diagnostic test results will improve Outcome: Adequate for Discharge Goal: Respiratory complications will improve Outcome: Adequate for Discharge Goal: Cardiovascular complication will be avoided Outcome: Adequate for Discharge   Problem: Activity: Goal: Risk for activity intolerance will decrease Outcome: Adequate for Discharge   Problem: Nutrition: Goal: Adequate nutrition will be maintained Outcome: Adequate for Discharge   Problem: Coping: Goal: Level of anxiety will decrease Outcome: Adequate for Discharge   Problem: Elimination: Goal: Will not experience complications related to bowel motility Outcome: Adequate for Discharge Goal: Will not experience complications related to urinary retention Outcome: Adequate for Discharge   Problem: Pain Managment: Goal: General experience of comfort will improve Outcome: Adequate for Discharge   Problem: Safety: Goal: Ability to remain free from injury will improve Outcome: Adequate for Discharge   Problem: Skin Integrity: Goal: Risk for impaired skin integrity will decrease Outcome: Adequate for Discharge   Problem: Education: Goal: Knowledge of the prescribed therapeutic regimen will improve Outcome: Adequate for Discharge Goal: Understanding of activity limitations/precautions following surgery will improve Outcome: Adequate for  Discharge Goal: Individualized Educational Video(s) Outcome: Adequate for Discharge   Problem: Activity: Goal: Ability to tolerate increased activity will improve Outcome: Adequate for Discharge   Problem: Pain Management: Goal: Pain level will decrease with appropriate interventions Outcome: Adequate for Discharge   

## 2018-11-20 NOTE — Evaluation (Addendum)
Occupational Therapy Evaluation Patient Details Name: Gene Hill MRN: [id number] DOB: October 23, 1940 Today's Date: 11/20/2018    History of Present Illness Pt is an 78 y/o male now s/p reverse L TSA. PMHx includes HTN, CAD, multiple myeloma, hx of spinal fusion and cervical fusion, hx of reverse R TSA 06/2018   Clinical Impression   This 78 y/o male presents with the above. PTA pt reports independence with ADL and functional mobility, reports intermittent use of SPC when outdoors. Pt performing functional mobility this session with minguard assist, pt intermittently seeking UE support using RUE during mobility tasks. Pt requiring mod-maxA for LB and UB ADL secondary to LUE functional limitations. Extensively reviewed shoulder precautions, HEP, safety and compensatory techniques for performing ADL and mobility after discharge home with pt verbalizing and return demonstrating understanding, min cues required for carryover of precautions with improvements noted. Pt reports plans to return home with spouse and son who are able to assist PRN with ADL/mobility tasks. Questions answered throughout with no further acute OT needs identified. Recommend pt follow up as recommended per MD at time of discharge. Acute OT to sign off at this time. Thank you for this referral.     Follow Up Recommendations  Follow surgeon's recommendation for DC plan and follow-up therapies;Supervision/Assistance - 24 hour    Equipment Recommendations  None recommended by OT(pt's DME needs are met)           Precautions / Restrictions Precautions Precautions: Fall;Shoulder Type of Shoulder Precautions: NWB LUE, no A/PROM to L shoulder, okay for AROM e/w/h to tolerance, sling at all times except ADL/exercise Shoulder Interventions: Shoulder sling/immobilizer;At all times;Off for dressing/bathing/exercises Precaution Booklet Issued: Yes (comment) Precaution Comments: issued and reviewed with pt Required Braces or  Orthoses: Sling Restrictions Weight Bearing Restrictions: Yes LUE Weight Bearing: Non weight bearing      Mobility Bed Mobility               General bed mobility comments: pt seated EOB upon arrival  Transfers Overall transfer level: Needs assistance Equipment used: None Transfers: Sit to/from Stand Sit to Stand: Supervision         General transfer comment: for safety and balance    Balance Overall balance assessment: Needs assistance Sitting-balance support: Feet supported Sitting balance-Leahy Scale: Good     Standing balance support: Single extremity supported;No upper extremity supported;During functional activity Standing balance-Leahy Scale: Fair Standing balance comment: occasionally seeking out RUE support during mobility tasks; able to static stand without external assist                           ADL either performed or assessed with clinical judgement   ADL Overall ADL's : Needs assistance/impaired Eating/Feeding: Set up;Sitting   Grooming: Min guard;Standing;Wash/dry hands   Upper Body Bathing: Minimal assistance;Sitting   Lower Body Bathing: Moderate assistance;Sit to/from stand   Upper Body Dressing : Maximal assistance;Sitting Upper Body Dressing Details (indicate cue type and reason): donning button up shirt and sling management Lower Body Dressing: Moderate assistance;Sit to/from stand Lower Body Dressing Details (indicate cue type and reason): assist to thread pants/underwear over hips, some assist to thread LEs into pantlegs due to pt having non-skid socks on Toilet Transfer: Min guard;Ambulation Toilet Transfer Details (indicate cue type and reason): for safety, pt often seeking RUE support during mobility Toileting- Clothing Manipulation and Hygiene: Minimal assistance;Sit to/from stand Toileting - Clothing Manipulation Details (indicate cue type and reason): assist for  clothing management post toileting    Tub/Shower  Transfer Details (indicate cue type and reason): discussed pt using shower chair (BSC) in shower initially after return home for increased safety during ADL completion; pt in agreement and verbalizing understanding  Functional mobility during ADLs: Min guard       Vision         Perception     Praxis      Pertinent Vitals/Pain Pain Assessment: Faces Faces Pain Scale: Hurts little more Pain Location: L shoulder/LUE Pain Descriptors / Indicators: Sore;Tingling Pain Intervention(s): Limited activity within patient's tolerance;Monitored during session;Repositioned     Hand Dominance Left   Extremity/Trunk Assessment Upper Extremity Assessment Upper Extremity Assessment: LUE deficits/detail LUE Deficits / Details: s/p reverse L TSA LUE: Unable to fully assess due to immobilization LUE Coordination: decreased gross motor   Lower Extremity Assessment Lower Extremity Assessment: Overall WFL for tasks assessed       Communication Communication Communication: No difficulties   Cognition Arousal/Alertness: Awake/alert Behavior During Therapy: WFL for tasks assessed/performed Overall Cognitive Status: Within Functional Limits for tasks assessed                                 General Comments: pt endorses intermittent STM deficits ("chemo brain")   General Comments       Exercises Exercises: Shoulder;Hand exercises Shoulder Exercises Elbow Flexion: AROM;Left;10 reps;Seated Elbow Extension: AROM;Left;10 reps;Seated Wrist Flexion: AROM;Left;10 reps;Seated Wrist Extension: AROM;Left;10 reps;Seated Digit Composite Flexion: AROM;Left;10 reps;Seated Composite Extension: AROM;Left;10 reps;Seated Neck Flexion: AROM;Seated Neck Extension: AROM;Seated Neck Lateral Flexion - Right: AROM;Seated Neck Lateral Flexion - Left: AROM;Seated Hand Exercises Forearm Supination: AROM;10 reps;Left;Seated Forearm Pronation: AROM;Left;10 reps;Seated   Shoulder Instructions  Shoulder Instructions Donning/doffing shirt without moving shoulder: Maximal assistance;Patient able to independently direct caregiver Method for sponge bathing under operated UE: Minimal assistance;Patient able to independently direct caregiver Donning/doffing sling/immobilizer: Maximal assistance;Patient able to independently direct caregiver Correct positioning of sling/immobilizer: Patient able to independently direct caregiver;Minimal assistance ROM for elbow, wrist and digits of operated UE: Supervision/safety Sling wearing schedule (on at all times/off for ADL's): Supervision/safety Proper positioning of operated UE when showering: Supervision/safety Positioning of UE while sleeping: Minimal assistance;Patient able to independently direct caregiver    Home Living Family/patient expects to be discharged to:: Private residence Living Arrangements: Spouse/significant other;Children(son) Available Help at Discharge: Family;Available 24 hours/day Type of Home: House Home Access: Stairs to enter [employer] of Steps: 2 and then 3 Entrance Stairs-Rails: Left Home Layout: Two level Alternate Level Stairs-Number of Steps: flight Alternate Level Stairs-Rails: Right Bathroom Shower/Tub: Teacher, early years/pre: Handicapped height     Home Equipment: Cane - single point;Bedside commode          Prior Functioning/Environment Level of Independence: Independent        Comments: occasional use of SPC when outdoors        OT Problem List: Decreased strength;Decreased range of motion;Decreased activity tolerance;Impaired balance (sitting and/or standing);Decreased knowledge of use of DME or AE;Decreased knowledge of precautions;Obesity;Impaired UE functional use;Pain      OT Treatment/Interventions:      OT Goals(Current goals can be found in the care plan section) Acute Rehab OT Goals Patient Stated Goal:  home today OT Goal Formulation: All assessment and  education complete, DC therapy  OT Frequency:     Barriers to D/C:            Co-evaluation  AM-PAC OT "6 Clicks" Daily Activity     Outcome Measure Help from another person eating meals?: A Little Help from another person taking care of personal grooming?: A Little Help from another person toileting, which includes using toliet, bedpan, or urinal?: A Little Help from another person bathing (including washing, rinsing, drying)?: A Lot Help from another person to put on and taking off regular upper body clothing?: A Lot Help from another person to put on and taking off regular lower body clothing?: A Lot 6 Click Score: 15   End of Session Equipment Utilized During Treatment: Other (comment)(sling) Nurse Communication: Mobility status  Activity Tolerance: Patient tolerated treatment well Patient left: in chair;with call bell/phone within reach;Other (comment)(nursing student present)  OT Visit Diagnosis: Other abnormalities of gait and mobility (R26.89);Muscle weakness (generalized) (M62.81)                Time: 9842-1031 OT Time Calculation (min): 36 min Charges:  OT General Charges $OT Visit: 1 Visit OT Evaluation $OT Eval Low Complexity: 1 Low OT Treatments $Self Care/Home Management : 8-22 mins  Lou Cal, OT [facility] Pager [phone] Office [place] 11/20/2018, 10:24 AM

## 2018-11-20 NOTE — Discharge Summary (Signed)
Patient ID: Gene Hill MRN: [id number] DOB/AGE: Dec 02, 1940 78 y.o.  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 Arthritis   . 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 Blood dyscrasia    low WBC d/t multiple myeloma  . Bulging lumbar disc    L2-3  . Carotid bruit    3-11 carotid u/s was  (-)  . 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 GERD (gastroesophageal reflux disease)   . Headache   . Heart murmur    doesn't have one  . HYPERLIPIDEMIA 05/03/2006  . HYPERTENSION 05/03/2006  . 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 Torn rotator cuff 01/22/2017   left- [place]    Procedures Performed: Left reverse total Shoulder Arthroplasty Removal of hardware: Multiple anchors and suture.  Discharged Condition: good/stable  Hospital Course: Patient brought in on 11/19/2018 for a left reverse total shoulder arthroplasty.  He tolerated procedure well. He was kept overnight for pain control,OT, prophylactic antibiotics, and discharge planning. He was found to be stable for DC home the morning after surgery.  Patient was instructed on specific activity restrictions and all questions were answered.  Consults: OT  Significant Diagnostic Studies: No additional pertinent studies  Treatments: Left reverse total Shoulder Arthroplasty Removal of hardware: Multiple anchors and suture  Discharge Exam: Left shoulder: Dressing CDI and sling well fitting. full and painless ROM throughout hand. + Motor in  AIN, PIN, Ulnar distributions. Axillary nerve sensation preserved and  symmetric.  Sensation intact in medial, radial, and ulnar distributions. Well perfused digits.   Disposition: Discharge disposition: 01-Home or Self Care      Post-operative plan: NWB in sling Discharge meds: Xarelto due to patient's history of multiple myeloma. Patient has prescription for oxycodone and zofran at home. Will start scheduled Tylenol and Celebrex. Prescriptions sent electronically to patient's local pharmacy.  Follow-up: 1 week in office for incision check and XR PT: Start after first visit   Discharge Instructions    Call MD for:  redness, tenderness, or signs of infection (pain, swelling, redness, odor or green/yellow discharge around incision site)   Complete by: As directed    Call MD for:  severe uncontrolled pain   Complete by: As directed    Call MD for:  temperature >100.4   Complete by: As directed    Diet - low sodium heart healthy   Complete by: As directed    Discharge instructions   Complete by: As directed    Ophelia Charter MD, MPH [facility] [address], [address] [phone] (tel)   [phone] (fax)   [place] may leave the operative dressing in place until your follow-up appointment.  KEEP THE INCISIONS CLEAN AND DRY.  Use the provided ice machine or Ice packs as often as possible for the first 3-4 days, then as needed for pain relief.  Keep a layer of cloth or a shirt between your skin and the cooling unit to prevent frost bite as it can get very cold.  You may  shower on Post-Op Day #2. The dressing is water resistant but do not scrub it as it may start to peel up.  You may remove the sling for showering, but keep a water resistant pillow under the arm to keep both the elbow and shoulder away from the body (mimicking the abduction sling). Gently pat the area dry. Do not soak the shoulder in water. Do not go swimming in the pool or ocean until your  sutures are removed.  EXERCISES Wear the sling at all times except when doing your exercises. You may remove the sling for showering, but keep the arm across the chest or in a secondary sling.     Accidental/Purposeful External Rotation and shoulder flexion (reaching behind you) is to be avoided at all costs for the first month.  Please perform the exercises:   Elbow / Hand / Wrist  Range of Motion Exercises  FOLLOW-UP If you develop a Fever (>101.5), Redness or Drainage from the surgical incision site, please call our office to arrange for an evaluation. Please call the office to schedule a follow-up appointment for a wound check, 7-10 days post-operatively.    IF YOU HAVE ANY QUESTIONS, PLEASE FEEL FREE TO CALL OUR OFFICE.   HELPFUL INFORMATION  Your arm will be in a sling following surgery. You will be in this sling for the next 3-4 weeks.  I will let you know the exact duration at your follow-up visit.  You may be more comfortable sleeping in a semi-seated position the first few nights following surgery.  Keep a pillow propped under the elbow and forearm for comfort.  If you have a recliner type of chair it might be beneficial.  If not that is fine too, but it would be helpful to sleep propped up with pillows behind your operated shoulder as well under your elbow and forearm.  This will reduce pulling on the suture lines.  We suggest you use the pain medication the first night prior to going to bed, in order to ease any pain when the anesthesia wears off. You should avoid taking pain medications on an empty stomach as it will make you nauseous.  Do not drink alcoholic beverages or take illicit drugs when taking pain medications.  In most states it is against the law to drive while your arm is in a sling. And certainly against the law to drive while taking narcotics.  You may return to work/school in the next couple of days when you feel up to it. Desk work and typing in the sling  is fine.  When dressing, put your operative arm in the sleeve first.  When getting undressed, take your operative arm out last.  Loose fitting, button-down shirts are recommended.  Pain medication may make you constipated.  Below are a few solutions to try in this order: Decrease the amount of pain medication if you aren't having pain. Drink lots of decaffeinated fluids. Drink prune juice and/or each dried prunes  If the first 3 don't work start with additional solutions Take Colace - an over-the-counter stool softener Take Senokot - an over-the-counter laxative Take Miralax - a stronger over-the-counter laxative     Allergies as of 11/20/2018   No Known Allergies     Medication List    STOP taking these medications   aspirin 325 MG tablet     TAKE these medications   acetaminophen 500 MG tablet Commonly known as: TYLENOL Take 2 tablets (1,000 mg total) by mouth every 8 (  eight) hours for 14 days.   atorvastatin 10 MG tablet Commonly known as: LIPITOR Take 1 tablet (10 mg total) by mouth at bedtime.   b complex vitamins tablet Take 1 tablet by mouth daily.   calcium carbonate 1500 (600 Ca) MG Tabs tablet Commonly known as: OSCAL Take 600 mg of elemental calcium by mouth daily with breakfast.   celecoxib 200 MG capsule Commonly known as: CeleBREX Take 1 capsule (200 mg total) by mouth 2 (two) times daily.   clindamycin 150 MG capsule Commonly known as: CLEOCIN Take 150 mg by mouth as needed. Take four capsules total of 600 mg one hour before dental work.   DARZALEX IV Inject into the vein every 6 (six) weeks.   dexamethasone 2 MG tablet Commonly known as: DECADRON TAKE 2 TABLETS BY MOUTH DAY AFTER CHEMO THEN 1 TABLET EVERY DAY FOR 4 DAYS What changed:   how much to take  how to take this  when to take this  additional instructions   Fish Oil 1200 MG Caps Take 1,200 mg by mouth daily.   fluticasone 50 MCG/ACT nasal spray Commonly known as: FLONASE  Place 1 spray into both nostrils at bedtime.   methocarbamol 500 MG tablet Commonly known as: ROBAXIN Take 1 tablet (500 mg total) by mouth 3 (three) times daily as needed for muscle spasms.   metoprolol tartrate 100 MG tablet Commonly known as: LOPRESSOR Take 0.5 tablets (50 mg total) by mouth 2 (two) times daily.   multivitamin tablet Take 1 tablet by mouth daily.   oxyCODONE 5 MG immediate release tablet Commonly known as: Oxy IR/ROXICODONE Take 1 tablet (5 mg total) by mouth every 6 (six) hours as needed for severe pain.   pantoprazole 40 MG tablet Commonly known as: PROTONIX Take 1 tablet (40 mg total) by mouth at bedtime.   PROBIOTIC PO Take 1 tablet by mouth daily.   rivaroxaban 10 MG Tabs tablet Commonly known as: Xarelto Take 1 tablet (10 mg total) by mouth daily.   tamsulosin 0.4 MG Caps capsule Commonly known as: FLOMAX Take 0.4 mg by mouth daily.   VITAMIN B-12 PO Take 5,000 mcg by mouth daily.

## 2018-11-27 DIAGNOSIS — M19012 Primary osteoarthritis, left shoulder: Secondary | ICD-10-CM | POA: Diagnosis not present

## 2018-12-05 DIAGNOSIS — M19012 Primary osteoarthritis, left shoulder: Secondary | ICD-10-CM | POA: Diagnosis not present

## 2018-12-05 DIAGNOSIS — M6281 Muscle weakness (generalized): Secondary | ICD-10-CM | POA: Diagnosis not present

## 2018-12-05 DIAGNOSIS — M25612 Stiffness of left shoulder, not elsewhere classified: Secondary | ICD-10-CM | POA: Diagnosis not present

## 2018-12-05 DIAGNOSIS — M25512 Pain in left shoulder: Secondary | ICD-10-CM | POA: Diagnosis not present

## 2018-12-09 DIAGNOSIS — M25512 Pain in left shoulder: Secondary | ICD-10-CM | POA: Diagnosis not present

## 2018-12-09 DIAGNOSIS — M6281 Muscle weakness (generalized): Secondary | ICD-10-CM | POA: Diagnosis not present

## 2018-12-09 DIAGNOSIS — M19012 Primary osteoarthritis, left shoulder: Secondary | ICD-10-CM | POA: Diagnosis not present

## 2018-12-09 DIAGNOSIS — M25612 Stiffness of left shoulder, not elsewhere classified: Secondary | ICD-10-CM | POA: Diagnosis not present

## 2018-12-10 DIAGNOSIS — N281 Cyst of kidney, acquired: Secondary | ICD-10-CM | POA: Diagnosis not present

## 2018-12-10 DIAGNOSIS — R3912 Poor urinary stream: Secondary | ICD-10-CM | POA: Diagnosis not present

## 2018-12-10 DIAGNOSIS — R3914 Feeling of incomplete bladder emptying: Secondary | ICD-10-CM | POA: Diagnosis not present

## 2018-12-11 DIAGNOSIS — M19012 Primary osteoarthritis, left shoulder: Secondary | ICD-10-CM | POA: Diagnosis not present

## 2018-12-11 DIAGNOSIS — M25612 Stiffness of left shoulder, not elsewhere classified: Secondary | ICD-10-CM | POA: Diagnosis not present

## 2018-12-11 DIAGNOSIS — M6281 Muscle weakness (generalized): Secondary | ICD-10-CM | POA: Diagnosis not present

## 2018-12-11 DIAGNOSIS — M25512 Pain in left shoulder: Secondary | ICD-10-CM | POA: Diagnosis not present

## 2018-12-15 ENCOUNTER — Ambulatory Visit: Payer: PPO

## 2018-12-15 ENCOUNTER — Other Ambulatory Visit: Payer: PPO

## 2018-12-15 ENCOUNTER — Ambulatory Visit: Payer: PPO | Admitting: Hematology & Oncology

## 2018-12-16 ENCOUNTER — Other Ambulatory Visit: Payer: PPO

## 2018-12-16 ENCOUNTER — Ambulatory Visit: Payer: PPO

## 2018-12-16 ENCOUNTER — Ambulatory Visit: Payer: PPO | Admitting: Hematology & Oncology

## 2018-12-16 DIAGNOSIS — M25612 Stiffness of left shoulder, not elsewhere classified: Secondary | ICD-10-CM | POA: Diagnosis not present

## 2018-12-16 DIAGNOSIS — M6281 Muscle weakness (generalized): Secondary | ICD-10-CM | POA: Diagnosis not present

## 2018-12-16 DIAGNOSIS — M19012 Primary osteoarthritis, left shoulder: Secondary | ICD-10-CM | POA: Diagnosis not present

## 2018-12-16 DIAGNOSIS — M25512 Pain in left shoulder: Secondary | ICD-10-CM | POA: Diagnosis not present

## 2018-12-19 ENCOUNTER — Other Ambulatory Visit: Payer: Self-pay | Admitting: Internal Medicine

## 2018-12-22 ENCOUNTER — Inpatient Hospital Stay: Payer: PPO

## 2018-12-22 ENCOUNTER — Inpatient Hospital Stay: Payer: PPO | Attending: Hematology & Oncology

## 2018-12-22 ENCOUNTER — Inpatient Hospital Stay (HOSPITAL_BASED_OUTPATIENT_CLINIC_OR_DEPARTMENT_OTHER): Payer: PPO | Admitting: Hematology & Oncology

## 2018-12-22 ENCOUNTER — Encounter: Payer: Self-pay | Admitting: Hematology & Oncology

## 2018-12-22 ENCOUNTER — Other Ambulatory Visit: Payer: Self-pay

## 2018-12-22 VITALS — BP 133/86 | HR 71 | Temp 97.3°F | Resp 18 | Wt 214.0 lb

## 2018-12-22 DIAGNOSIS — M546 Pain in thoracic spine: Secondary | ICD-10-CM

## 2018-12-22 DIAGNOSIS — C9000 Multiple myeloma not having achieved remission: Secondary | ICD-10-CM

## 2018-12-22 DIAGNOSIS — Z9481 Bone marrow transplant status: Secondary | ICD-10-CM

## 2018-12-22 DIAGNOSIS — C9002 Multiple myeloma in relapse: Secondary | ICD-10-CM | POA: Diagnosis not present

## 2018-12-22 DIAGNOSIS — Z5112 Encounter for antineoplastic immunotherapy: Secondary | ICD-10-CM | POA: Diagnosis not present

## 2018-12-22 LAB — CMP (CANCER CENTER ONLY)
ALT: 14 U/L (ref 0–44)
AST: 15 U/L (ref 15–41)
Albumin: 4.3 g/dL (ref 3.5–5.0)
Alkaline Phosphatase: 40 U/L (ref 38–126)
Anion gap: 8 (ref 5–15)
BUN: 24 mg/dL — ABNORMAL HIGH (ref 8–23)
CO2: 26 mmol/L (ref 22–32)
Calcium: 9 mg/dL (ref 8.9–10.3)
Chloride: 106 mmol/L (ref 98–111)
Creatinine: 1.03 mg/dL (ref 0.61–1.24)
GFR, Est AFR Am: >60 mL/min (ref >60)
GFR, Estimated: >60 mL/min (ref >60)
Glucose, Bld: 106 mg/dL — ABNORMAL HIGH (ref 70–99)
Potassium: 4 mmol/L (ref 3.5–5.1)
Sodium: 140 mmol/L (ref 135–145)
Total Bilirubin: 0.3 mg/dL (ref 0.3–1.2)
Total Protein: 7 g/dL (ref 6.5–8.1)

## 2018-12-22 LAB — CBC WITH DIFFERENTIAL (CANCER CENTER ONLY)
Abs Immature Granulocytes: 0.01 10*3/uL (ref 0.00–0.07)
Basophils Absolute: 0 10*3/uL (ref 0.0–0.1)
Basophils Relative: 1 %
Eosinophils Absolute: 0.1 10*3/uL (ref 0.0–0.5)
Eosinophils Relative: 2 %
HCT: 32.5 % — ABNORMAL LOW (ref 39.0–52.0)
Hemoglobin: 10.7 g/dL — ABNORMAL LOW (ref 13.0–17.0)
Immature Granulocytes: 0 %
Lymphocytes Relative: 33 %
Lymphs Abs: 1.5 10*3/uL (ref 0.7–4.0)
MCH: 33.9 pg (ref 26.0–34.0)
MCHC: 32.9 g/dL (ref 30.0–36.0)
MCV: 102.8 fL — ABNORMAL HIGH (ref 80.0–100.0)
Monocytes Absolute: 0.7 10*3/uL (ref 0.1–1.0)
Monocytes Relative: 15 %
Neutro Abs: 2.3 10*3/uL (ref 1.7–7.7)
Neutrophils Relative %: 49 %
Platelet Count: 145 10*3/uL — ABNORMAL LOW (ref 150–400)
RBC: 3.16 MIL/uL — ABNORMAL LOW (ref 4.22–5.81)
RDW: 13.9 % (ref 11.5–15.5)
WBC Count: 4.6 10*3/uL (ref 4.0–10.5)
nRBC: 0 % (ref 0.0–0.2)

## 2018-12-22 MED ORDER — ACETAMINOPHEN 325 MG PO TABS
650.0000 mg | ORAL_TABLET | Freq: Once | ORAL | Status: AC
  Administered 2018-12-22: 650 mg via ORAL

## 2018-12-22 MED ORDER — DIPHENHYDRAMINE HCL 25 MG PO CAPS
50.0000 mg | ORAL_CAPSULE | Freq: Once | ORAL | Status: AC
  Administered 2018-12-22: 50 mg via ORAL

## 2018-12-22 MED ORDER — DENOSUMAB 120 MG/1.7ML ~~LOC~~ SOLN
120.0000 mg | Freq: Once | SUBCUTANEOUS | Status: AC
  Administered 2018-12-22: 120 mg via SUBCUTANEOUS

## 2018-12-22 MED ORDER — ACETAMINOPHEN 325 MG PO TABS
ORAL_TABLET | ORAL | Status: AC
  Filled 2018-12-22: qty 2

## 2018-12-22 MED ORDER — SODIUM CHLORIDE 0.9% FLUSH
10.0000 mL | Freq: Once | INTRAVENOUS | Status: AC
  Administered 2018-12-22: 10 mL
  Filled 2018-12-22: qty 10

## 2018-12-22 MED ORDER — PROCHLORPERAZINE MALEATE 10 MG PO TABS
ORAL_TABLET | ORAL | Status: AC
  Filled 2018-12-22: qty 1

## 2018-12-22 MED ORDER — DEXAMETHASONE 4 MG PO TABS
20.0000 mg | ORAL_TABLET | Freq: Once | ORAL | Status: AC
  Administered 2018-12-22: 20 mg via ORAL

## 2018-12-22 MED ORDER — PROCHLORPERAZINE MALEATE 10 MG PO TABS
10.0000 mg | ORAL_TABLET | Freq: Once | ORAL | Status: AC
  Administered 2018-12-22: 10 mg via ORAL

## 2018-12-22 MED ORDER — HEPARIN SOD (PORK) LOCK FLUSH 100 UNIT/ML IV SOLN
500.0000 [IU] | Freq: Once | INTRAVENOUS | Status: AC | PRN
  Administered 2018-12-22: 500 [IU]
  Filled 2018-12-22: qty 5

## 2018-12-22 MED ORDER — SODIUM CHLORIDE 0.9% FLUSH
10.0000 mL | INTRAVENOUS | Status: DC | PRN
  Administered 2018-12-22: 10 mL
  Filled 2018-12-22: qty 10

## 2018-12-22 MED ORDER — DIPHENHYDRAMINE HCL 25 MG PO CAPS
ORAL_CAPSULE | ORAL | Status: AC
  Filled 2018-12-22: qty 2

## 2018-12-22 MED ORDER — DEXAMETHASONE 4 MG PO TABS
ORAL_TABLET | ORAL | Status: AC
  Filled 2018-12-22: qty 5

## 2018-12-22 MED ORDER — DARATUMUMAB-HYALURONIDASE-FIHJ 1800-30000 MG-UT/15ML ~~LOC~~ SOLN
1800.0000 mg | Freq: Once | SUBCUTANEOUS | Status: AC
  Administered 2018-12-22: 1800 mg via SUBCUTANEOUS
  Filled 2018-12-22: qty 15

## 2018-12-22 MED ORDER — DENOSUMAB 120 MG/1.7ML ~~LOC~~ SOLN
SUBCUTANEOUS | Status: AC
  Filled 2018-12-22: qty 1.7

## 2018-12-22 NOTE — Progress Notes (Signed)
Hematology and Oncology Follow Up Visit  BERNERD JEREZ [id number] 1940/08/03 78 y.o. 12/22/2018   Principle Diagnosis:  IgA kappa myeloma - relapsed post ASCT (+4, +11, 13q- and 17p-)     Past Therapy: Pomalyst stopped 12/25/2016        Current Therapy:   Daratumumab q 6 week dosing -changed on 10/28/2017  - s/p cycle #29 Xgeva 120 m sq q 3 months - next dose 10/2018   Interim History:  Mr. Wilensky is here today for follow-up.  He did have his left shoulder operated on about 5 weeks ago.  It sounds like he had a total shoulder arthroplasty.  From the operative note, the rotator cuff was torn and could not be repaired.  He still has the left shoulder in a harness.  This has been 5 weeks now.  He is doing a little bit of physical therapy.  Hopefully, he will be able to have the shoulder harness taken off and he can do more therapy and movement of the left shoulder soon.  As far as the myeloma goes, this is doing okay.  His last myeloma levels I think back in October showed an M spike of 0.5 g/dL.  His IgA level was 1450 mg/dL.  His kappa light chain was 8.4 mg/dL.  Overall everything is pretty stable.  We still have to watch the light chain a little bit.  He had a good Thanksgiving.  He is doing his best to try to be as independent as possible.  Thankfully, his wife is doing a great job helping him out.  He has had no fever.  He has had no cough.  There has been no problems with bowels or bladder.  Overall, I would say performance status is ECOG 1.   Medications:  Allergies as of 12/22/2018   No Known Allergies     Medication List       Accurate as of December 22, 2018  8:41 AM. If you have any questions, ask your nurse or doctor.        STOP taking these medications   clindamycin 150 MG capsule Commonly known as: CLEOCIN Stopped by: [name], MD   rivaroxaban 10 MG Tabs tablet Commonly known as: Xarelto Stopped by: [name], MD     TAKE these  medications   atorvastatin 10 MG tablet Commonly known as: LIPITOR Take 1 tablet (10 mg total) by mouth at bedtime.   b complex vitamins tablet Take 1 tablet by mouth daily.   calcium carbonate 1500 (600 Ca) MG Tabs tablet Commonly known as: OSCAL Take 600 mg of elemental calcium by mouth daily with breakfast.   DARZALEX IV Inject into the vein every 6 (six) weeks.   dexamethasone 2 MG tablet Commonly known as: DECADRON TAKE 2 TABLETS BY MOUTH DAY AFTER CHEMO THEN 1 TABLET EVERY DAY FOR 4 DAYS What changed:   how much to take  how to take this  when to take this  additional instructions   Fish Oil 1200 MG Caps Take 1,200 mg by mouth daily.   fluticasone 50 MCG/ACT nasal spray Commonly known as: FLONASE Place 1 spray into both nostrils at bedtime.   ketorolac in sodium chloride infusion SMARTSIG:2 Milliliter(s) IM Once PRN   methocarbamol 500 MG tablet Commonly known as: ROBAXIN Take 1 tablet (500 mg total) by mouth 3 (three) times daily as needed for muscle spasms.   metoprolol tartrate 100 MG tablet Commonly known as: LOPRESSOR Take 0.5  tablets (50 mg total) by mouth 2 (two) times daily.   multivitamin tablet Take 1 tablet by mouth daily.   oxyCODONE 5 MG immediate release tablet Commonly known as: Oxy IR/ROXICODONE Take 1 tablet (5 mg total) by mouth every 6 (six) hours as needed for severe pain.   pantoprazole 40 MG tablet Commonly known as: PROTONIX Take 1 tablet (40 mg total) by mouth at bedtime.   PROBIOTIC PO Take 1 tablet by mouth daily.   tamsulosin 0.4 MG Caps capsule Commonly known as: FLOMAX Take 0.4 mg by mouth daily.   VITAMIN B-12 PO Take 5,000 mcg by mouth daily.       Allergies: No Known Allergies  Past Medical History, Surgical history, Social history, and Family History were reviewed and updated.  Review of Systems: Review of Systems  Constitutional: Negative.   HENT: Negative.   Eyes: Negative.   Respiratory: Negative.    Cardiovascular: Negative.   Gastrointestinal: Negative.   Genitourinary: Negative.   Musculoskeletal: Positive for back pain and joint pain.  Skin: Negative.   Neurological: Negative.   Endo/Heme/Allergies: Negative.   Psychiatric/Behavioral: Negative.       Physical Exam:  weight is 214 lb (97.1 kg). His temporal temperature is 97.3 F (36.3 C) (abnormal). His blood pressure is 133/86 and his pulse is 71. His respiration is 18 and oxygen saturation is 96%.   Wt Readings from Last 3 Encounters:  12/22/18 214 lb (97.1 kg)  11/19/18 206 lb 3.2 oz (93.5 kg)  11/12/18 206 lb 3.2 oz (93.5 kg)  Is  Physical Exam Vitals signs reviewed.  HENT:     Head: Normocephalic and atraumatic.  Eyes:     Pupils: Pupils are equal, round, and reactive to light.  Neck:     Musculoskeletal: Normal range of motion.  Cardiovascular:     Rate and Rhythm: Normal rate and regular rhythm.     Heart sounds: Normal heart sounds.  Pulmonary:     Effort: Pulmonary effort is normal.     Breath sounds: Normal breath sounds.  Abdominal:     General: Bowel sounds are normal.     Palpations: Abdomen is soft.  Musculoskeletal: Normal range of motion.        General: No tenderness or deformity.  Lymphadenopathy:     Cervical: No cervical adenopathy.  Skin:    General: Skin is warm and dry.     Findings: No erythema or rash.  Neurological:     Mental Status: He is alert and oriented to person, place, and time.  Psychiatric:        Behavior: Behavior normal.        Thought Content: Thought content normal.        Judgment: Judgment normal.      Lab Results  Component Value Date   WBC 4.6 12/22/2018   HGB 10.7 (L) 12/22/2018   HCT 32.5 (L) 12/22/2018   MCV 102.8 (H) 12/22/2018   PLT 145 (L) 12/22/2018   Lab Results  Component Value Date   FERRITIN 46 11/03/2018   IRON 76 11/03/2018   TIBC 318 11/03/2018   UIBC 241 11/03/2018   IRONPCTSAT 24 11/03/2018   Lab Results  Component Value  Date   RBC 3.16 (L) 12/22/2018   Lab Results  Component Value Date   KPAFRELGTCHN 84.2 (H) 11/03/2018   LAMBDASER 2.4 (L) 11/03/2018   KAPLAMBRATIO 35.08 (H) 11/03/2018   Lab Results  Component Value Date   IGGSERUM 177 (L) 11/03/2018  IGGSERUM 171 (L) 11/03/2018   IGA 1,321 (H) 11/03/2018   IGA 1,449 (H) 11/03/2018   IGMSERUM 6 (L) 11/03/2018   IGMSERUM <5 (L) 11/03/2018   Lab Results  Component Value Date   TOTALPROTELP 6.7 11/03/2018   ALBUMINELP 3.7 11/03/2018   A1GS 0.2 11/03/2018   A2GS 0.8 11/03/2018   BETS 1.7 (H) 11/03/2018   BETA2SER 2.0 (H) 12/08/2014   GAMS 0.3 (L) 11/03/2018   MSPIKE 0.5 (H) 11/03/2018   SPEI Comment 09/02/2017     Chemistry      Component Value Date/Time   NA 139 11/12/2018 1034   NA 141 01/21/2017 0741   NA 139 06/14/2016 1246   K 4.2 11/12/2018 1034   K 4.0 01/21/2017 0741   K 3.8 06/14/2016 1246   CL 102 11/12/2018 1034   CL 103 01/21/2017 0741   CO2 26 11/12/2018 1034   CO2 30 01/21/2017 0741   CO2 24 06/14/2016 1246   BUN 20 11/12/2018 1034   BUN 20 01/21/2017 0741   BUN 15.0 06/14/2016 1246   CREATININE 0.93 11/12/2018 1034   CREATININE 0.92 11/03/2018 0800   CREATININE 1.0 01/21/2017 0741   CREATININE 0.9 06/14/2016 1246      Component Value Date/Time   CALCIUM 9.6 11/12/2018 1034   CALCIUM 9.0 01/21/2017 0741   CALCIUM 9.6 06/14/2016 1246   ALKPHOS 33 (L) 11/03/2018 0800   ALKPHOS 37 01/21/2017 0741   ALKPHOS 54 06/14/2016 1246   AST 17 11/03/2018 0800   AST 25 06/14/2016 1246   ALT 19 11/03/2018 0800   ALT 29 01/21/2017 0741   ALT 21 06/14/2016 1246   BILITOT 0.3 11/03/2018 0800   BILITOT 0.37 06/14/2016 1246      Impression and Plan: Mr. Ikner is a very pleasant 78 yo caucasian gentleman with history of IgA kappa myeloma. He had high risk cytogenetics with a 17p- abnormality.   I think that the myeloma studies this visit will be critical.  Hopefully, we will see that everything is stable.  If the  levels are higher, we will have to make some type of change in our protocol.  I would like to try to get him through Christmas.  He is doing quite a bit with respect to the left shoulder and trying to get therapy for the left shoulder.  I am just glad that he got through surgery well.  It sounds like his left knee might need to be operated on down the road.     [name], MD 11/30/20208:41 AM

## 2018-12-22 NOTE — Patient Instructions (Signed)

## 2018-12-22 NOTE — Patient Instructions (Signed)
[facility] Discharge Instructions for Patients Receiving Chemotherapy  Today you received the following chemotherapy agents:  Darzalex  To help prevent nausea and vomiting after your treatment, we encourage you to take your nausea medication as prescribed.   If you develop nausea and vomiting that is not controlled by your nausea medication, call the clinic.   BELOW ARE SYMPTOMS THAT SHOULD BE REPORTED IMMEDIATELY:  *FEVER GREATER THAN 100.5 F  *CHILLS WITH OR WITHOUT FEVER  NAUSEA AND VOMITING THAT IS NOT CONTROLLED WITH YOUR NAUSEA MEDICATION  *UNUSUAL SHORTNESS OF BREATH  *UNUSUAL BRUISING OR BLEEDING  TENDERNESS IN MOUTH AND THROAT WITH OR WITHOUT PRESENCE OF ULCERS  *URINARY PROBLEMS  *BOWEL PROBLEMS  UNUSUAL RASH Items with * indicate a potential emergency and should be followed up as soon as possible.  Feel free to call the clinic should you have any questions or concerns. The clinic phone number is [phone].  Please show the CHEMO ALERT CARD at check-in to the Emergency Department and triage nurse.   

## 2018-12-23 DIAGNOSIS — M25512 Pain in left shoulder: Secondary | ICD-10-CM | POA: Diagnosis not present

## 2018-12-23 DIAGNOSIS — M6281 Muscle weakness (generalized): Secondary | ICD-10-CM | POA: Diagnosis not present

## 2018-12-23 DIAGNOSIS — M19012 Primary osteoarthritis, left shoulder: Secondary | ICD-10-CM | POA: Diagnosis not present

## 2018-12-23 DIAGNOSIS — M25612 Stiffness of left shoulder, not elsewhere classified: Secondary | ICD-10-CM | POA: Diagnosis not present

## 2018-12-23 LAB — IGG, IGA, IGM
IgA: 1283 mg/dL — ABNORMAL HIGH (ref 61–437)
IgG (Immunoglobin G), Serum: 139 mg/dL — ABNORMAL LOW (ref 603–1613)
IgM (Immunoglobulin M), Srm: <5 mg/dL — ABNORMAL LOW (ref 15–143)

## 2018-12-23 LAB — KAPPA/LAMBDA LIGHT CHAINS
Kappa free light chain: 74.4 mg/L — ABNORMAL HIGH (ref 3.3–19.4)
Kappa, lambda light chain ratio: 39.16 — ABNORMAL HIGH (ref 0.26–1.65)
Lambda free light chains: 1.9 mg/L — ABNORMAL LOW (ref 5.7–26.3)

## 2018-12-24 LAB — PROTEIN ELECTROPHORESIS, SERUM, WITH REFLEX
A/G Ratio: 1.2 (ref 0.7–1.7)
Albumin ELP: 3.6 g/dL (ref 2.9–4.4)
Alpha-1-Globulin: 0.2 g/dL (ref 0.0–0.4)
Alpha-2-Globulin: 0.8 g/dL (ref 0.4–1.0)
Beta Globulin: 1.7 g/dL — ABNORMAL HIGH (ref 0.7–1.3)
Gamma Globulin: 0.3 g/dL — ABNORMAL LOW (ref 0.4–1.8)
Globulin, Total: 3 g/dL (ref 2.2–3.9)
M-Spike, %: 0.9 g/dL — ABNORMAL HIGH
SPEP Interpretation: 0
Total Protein ELP: 6.6 g/dL (ref 6.0–8.5)

## 2018-12-24 LAB — IMMUNOFIXATION REFLEX, SERUM
IgA: 1338 mg/dL — ABNORMAL HIGH (ref 61–437)
IgG (Immunoglobin G), Serum: 156 mg/dL — ABNORMAL LOW (ref 603–1613)
IgM (Immunoglobulin M), Srm: <5 mg/dL — ABNORMAL LOW (ref 15–143)

## 2018-12-25 DIAGNOSIS — M25512 Pain in left shoulder: Secondary | ICD-10-CM | POA: Diagnosis not present

## 2018-12-25 DIAGNOSIS — M6281 Muscle weakness (generalized): Secondary | ICD-10-CM | POA: Diagnosis not present

## 2018-12-25 DIAGNOSIS — M25612 Stiffness of left shoulder, not elsewhere classified: Secondary | ICD-10-CM | POA: Diagnosis not present

## 2018-12-25 DIAGNOSIS — M19012 Primary osteoarthritis, left shoulder: Secondary | ICD-10-CM | POA: Diagnosis not present

## 2018-12-30 DIAGNOSIS — M25612 Stiffness of left shoulder, not elsewhere classified: Secondary | ICD-10-CM | POA: Diagnosis not present

## 2018-12-30 DIAGNOSIS — M19012 Primary osteoarthritis, left shoulder: Secondary | ICD-10-CM | POA: Diagnosis not present

## 2018-12-30 DIAGNOSIS — M25512 Pain in left shoulder: Secondary | ICD-10-CM | POA: Diagnosis not present

## 2018-12-30 DIAGNOSIS — M6281 Muscle weakness (generalized): Secondary | ICD-10-CM | POA: Diagnosis not present

## 2019-01-02 DIAGNOSIS — M25612 Stiffness of left shoulder, not elsewhere classified: Secondary | ICD-10-CM | POA: Diagnosis not present

## 2019-01-02 DIAGNOSIS — M25512 Pain in left shoulder: Secondary | ICD-10-CM | POA: Diagnosis not present

## 2019-01-02 DIAGNOSIS — M6281 Muscle weakness (generalized): Secondary | ICD-10-CM | POA: Diagnosis not present

## 2019-01-02 DIAGNOSIS — M19012 Primary osteoarthritis, left shoulder: Secondary | ICD-10-CM | POA: Diagnosis not present

## 2019-01-06 DIAGNOSIS — M545 Low back pain: Secondary | ICD-10-CM | POA: Diagnosis not present

## 2019-01-06 DIAGNOSIS — M6281 Muscle weakness (generalized): Secondary | ICD-10-CM | POA: Diagnosis not present

## 2019-01-06 DIAGNOSIS — M25512 Pain in left shoulder: Secondary | ICD-10-CM | POA: Diagnosis not present

## 2019-01-06 DIAGNOSIS — M25612 Stiffness of left shoulder, not elsewhere classified: Secondary | ICD-10-CM | POA: Diagnosis not present

## 2019-01-06 DIAGNOSIS — M25561 Pain in right knee: Secondary | ICD-10-CM | POA: Diagnosis not present

## 2019-01-06 DIAGNOSIS — M19012 Primary osteoarthritis, left shoulder: Secondary | ICD-10-CM | POA: Diagnosis not present

## 2019-01-08 DIAGNOSIS — M25612 Stiffness of left shoulder, not elsewhere classified: Secondary | ICD-10-CM | POA: Diagnosis not present

## 2019-01-08 DIAGNOSIS — M25512 Pain in left shoulder: Secondary | ICD-10-CM | POA: Diagnosis not present

## 2019-01-08 DIAGNOSIS — M25511 Pain in right shoulder: Secondary | ICD-10-CM | POA: Diagnosis not present

## 2019-01-08 DIAGNOSIS — N281 Cyst of kidney, acquired: Secondary | ICD-10-CM | POA: Diagnosis not present

## 2019-01-08 DIAGNOSIS — M6281 Muscle weakness (generalized): Secondary | ICD-10-CM | POA: Diagnosis not present

## 2019-01-08 DIAGNOSIS — M25561 Pain in right knee: Secondary | ICD-10-CM | POA: Diagnosis not present

## 2019-01-08 DIAGNOSIS — M19012 Primary osteoarthritis, left shoulder: Secondary | ICD-10-CM | POA: Diagnosis not present

## 2019-01-13 DIAGNOSIS — M19012 Primary osteoarthritis, left shoulder: Secondary | ICD-10-CM | POA: Diagnosis not present

## 2019-01-13 DIAGNOSIS — Z471 Aftercare following joint replacement surgery: Secondary | ICD-10-CM | POA: Diagnosis not present

## 2019-01-13 DIAGNOSIS — M25612 Stiffness of left shoulder, not elsewhere classified: Secondary | ICD-10-CM | POA: Diagnosis not present

## 2019-01-13 DIAGNOSIS — M6281 Muscle weakness (generalized): Secondary | ICD-10-CM | POA: Diagnosis not present

## 2019-01-14 ENCOUNTER — Encounter: Payer: Self-pay | Admitting: *Deleted

## 2019-01-14 NOTE — Progress Notes (Signed)
Faxed paperwork to [facility] for surgery clearance. Fax # is [phone], attention Claiborne Billings.

## 2019-01-15 ENCOUNTER — Telehealth: Payer: Self-pay | Admitting: Internal Medicine

## 2019-01-15 ENCOUNTER — Other Ambulatory Visit: Payer: Self-pay

## 2019-01-15 ENCOUNTER — Telehealth: Payer: Self-pay | Admitting: Cardiology

## 2019-01-15 DIAGNOSIS — M6281 Muscle weakness (generalized): Secondary | ICD-10-CM | POA: Diagnosis not present

## 2019-01-15 DIAGNOSIS — Z471 Aftercare following joint replacement surgery: Secondary | ICD-10-CM | POA: Diagnosis not present

## 2019-01-15 DIAGNOSIS — M25612 Stiffness of left shoulder, not elsewhere classified: Secondary | ICD-10-CM | POA: Diagnosis not present

## 2019-01-15 DIAGNOSIS — M25561 Pain in right knee: Secondary | ICD-10-CM | POA: Diagnosis not present

## 2019-01-15 DIAGNOSIS — M19012 Primary osteoarthritis, left shoulder: Secondary | ICD-10-CM | POA: Diagnosis not present

## 2019-01-15 NOTE — Telephone Encounter (Signed)
   [facility] Pre-operative Risk Assessment    Request for surgical clearance:  1. What type of surgery is being performed? Right total knee revision   2. When is this surgery scheduled? Pending, As soon as possible   3. What type of clearance is required (medical clearance vs. Pharmacy clearance to hold med vs. Both)? Cardiology Clearance   4. Are there any medications that need to be held prior to surgery and how long?  Not that they are aware of   5. Practice name and name of physician performing surgery?  [facility], Dr. French Ana  6. What is your office phone number [phone]   7.   What is your office fax number [phone]  8.   Anesthesia type (None, local, MAC, general) ? Spinal    Trilby Drummer 01/15/2019, 9:18 AM  _________________________________________________________________   (provider comments below)

## 2019-01-15 NOTE — Telephone Encounter (Signed)
Gene Hill, from [employer] [facility], called stating she faxed over an urgent surgery clearance on 01/09/2019 for the pt and is requesting to have a response back. Please advise.    Callback #[phone]  [phone]   Fax # [phone]

## 2019-01-15 NOTE — Telephone Encounter (Signed)
Form received

## 2019-01-15 NOTE — Telephone Encounter (Signed)
Spoke w/ [facility]- informed we haven't received form. Pt is in office now- last OV 03/2018- will need surgical clearance appt. Pt has been scheduled for 01/19/2019 w/ Dr. Larose Kells. Kelly to refax surg clearance form to [phone].

## 2019-01-19 ENCOUNTER — Encounter: Payer: Self-pay | Admitting: Cardiology

## 2019-01-19 ENCOUNTER — Ambulatory Visit (INDEPENDENT_AMBULATORY_CARE_PROVIDER_SITE_OTHER): Payer: PPO | Admitting: Internal Medicine

## 2019-01-19 ENCOUNTER — Other Ambulatory Visit: Payer: Self-pay

## 2019-01-19 ENCOUNTER — Encounter: Payer: Self-pay | Admitting: Internal Medicine

## 2019-01-19 VITALS — BP 153/80 | HR 99 | Temp 97.8°F | Resp 16 | Ht 66.0 in | Wt 213.2 lb

## 2019-01-19 DIAGNOSIS — M1711 Unilateral primary osteoarthritis, right knee: Secondary | ICD-10-CM | POA: Diagnosis not present

## 2019-01-19 DIAGNOSIS — I10 Essential (primary) hypertension: Secondary | ICD-10-CM | POA: Diagnosis not present

## 2019-01-19 MED ORDER — METHOCARBAMOL 500 MG PO TABS: 500.0000 mg | ORAL_TABLET | Freq: Three times a day (TID) | ORAL | 0 refills | Status: DC | PRN

## 2019-01-19 NOTE — Telephone Encounter (Signed)
Faxed to requesting office (Dr. Earlie Server) to fax number listed via Epic Fax.

## 2019-01-19 NOTE — Progress Notes (Signed)
Subjective:    Patient ID: Gene Hill, male    DOB: 1940/06/29, 78 y.o.   MRN: [id number]  DOS:  01/19/2019 Type of visit - description: Acute Needs surgical clearance. In general the patient is doing well. He tolerated well two shoulder replacement this years and now is ready to do a right total knee revision. Pain is significantly impacting his life.   BP Readings from Last 3 Encounters:  01/19/19 (!) 153/80  12/22/18 133/86  11/20/18 123/68    Review of Systems Denies chest pain or difficulty breathing with normal activities No nausea, vomiting, diarrhea No cough Past Medical History:  Diagnosis Date  . Arthritis   . 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 Blood dyscrasia    low WBC d/t multiple myeloma  . Bulging lumbar disc    L2-3  . Carotid bruit    3-11 carotid u/s was  (-)  . 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 GERD (gastroesophageal reflux disease)   . Headache   . Heart murmur    doesn't have one  . HYPERLIPIDEMIA 05/03/2006  . HYPERTENSION 05/03/2006  . 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 Torn rotator cuff 01/22/2017   left- [place]    Past Surgical History:  Procedure Laterality Date  . BACK SURGERY    . CERVICAL FUSION    . IR FLUORO GUIDE PORT INSERTION RIGHT  11/29/2016  . IR US GUIDE VASC ACCESS RIGHT  11/29/2016  . [place]   right  . REVERSE SHOULDER ARTHROPLASTY Right 06/25/2018   Procedure: REVERSE SHOULDER ARTHROPLASTY;  Surgeon: Hiram Gash, MD;  Location: [facility];  Service: Orthopedics;  Laterality: Right;  . REVERSE SHOULDER ARTHROPLASTY Left 11/19/2018   Procedure: REVERSE SHOULDER ARTHROPLASTY;  Surgeon: Hiram Gash, MD;  Location: [facility];  Service: Orthopedics;  Laterality: Left;  . ROTATOR CUFF REPAIR      right  . SPINAL FUSION     lumbar  . TOTAL KNEE ARTHROPLASTY Right 12/23/2015   Procedure: TOTAL KNEE ARTHROPLASTY;  Surgeon: Earlie Server, MD;  Location: [facility];  Service: Orthopedics;  Laterality: Right;    Social History   Socioeconomic History  . Marital status: Married    Spouse name: Not on file  . Number of children: 4  . Years of education: Not on file  . Highest education level: Not on file  Occupational History  . Occupation: retired  Tobacco Use  . Smoking status: Former Smoker    Packs/day: 0.25    Years: 20.00    Pack years: 5.00    Types: Cigarettes    Start date: 04/28/1954    Quit date: 11/28/1974    Years since quitting: 44.1  . Smokeless tobacco: Never Used  . Tobacco comment: quit 35 years ago, 1976  Substance and Sexual Activity  . Alcohol use: Yes    Alcohol/week: 0.0 standard drinks    Comment: occ  . Drug use: No  . Sexual activity: Never  Other Topics Concern  . Not on file  Social History Narrative   Household-- pt, wife, son   Social Determinants of Health   Financial Resource Strain:   . Difficulty of Paying Living Expenses: Not on file  Food  Insecurity:   . Worried About Charity fundraiser in the Last Year: Not on file  . Ran Out of Food in the Last Year: Not on file  Transportation Needs:   . Lack of Transportation (Medical): Not on file  . Lack of Transportation (Non-Medical): Not on file  Physical Activity:   . Days of Exercise per Week: Not on file  . Minutes of Exercise per Session: Not on file  Stress:   . Feeling of Stress : Not on file  Social Connections:   . Frequency of Communication with Friends and Family: Not on file  . Frequency of Social Gatherings with Friends and Family: Not on file  . Attends Religious Services: Not on file  . Active Member of Clubs or Organizations: Not on file  . Attends Archivist Meetings: Not on file  . Marital Status: Not on file  Intimate Partner Violence:   . Fear of  Current or Ex-Partner: Not on file  . Emotionally Abused: Not on file  . Physically Abused: Not on file  . Sexually Abused: Not on file      Allergies as of 01/19/2019   No Known Allergies     Medication List       Accurate as of January 19, 2019 11:59 PM. If you have any questions, ask your nurse or doctor.        atorvastatin 10 MG tablet Commonly known as: LIPITOR Take 1 tablet (10 mg total) by mouth at bedtime.   b complex vitamins tablet Take 1 tablet by mouth daily.   calcium carbonate 1500 (600 Ca) MG Tabs tablet Commonly known as: OSCAL Take 600 mg of elemental calcium by mouth daily with breakfast.   DARZALEX IV Inject into the vein every 6 (six) weeks.   dexamethasone 2 MG tablet Commonly known as: DECADRON TAKE 2 TABLETS BY MOUTH DAY AFTER CHEMO THEN 1 TABLET EVERY DAY FOR 4 DAYS What changed:   how much to take  how to take this  when to take this  additional instructions   Fish Oil 1200 MG Caps Take 1,200 mg by mouth daily.   fluticasone 50 MCG/ACT nasal spray Commonly known as: FLONASE Place 1 spray into both nostrils at bedtime.   ketorolac in sodium chloride infusion SMARTSIG:2 Milliliter(s) IM Once PRN   methocarbamol 500 MG tablet Commonly known as: ROBAXIN Take 1 tablet (500 mg total) by mouth 3 (three) times daily as needed for muscle spasms.   metoprolol tartrate 100 MG tablet Commonly known as: LOPRESSOR Take 0.5 tablets (50 mg total) by mouth 2 (two) times daily.   multivitamin tablet Take 1 tablet by mouth daily.   oxyCODONE 5 MG immediate release tablet Commonly known as: Oxy IR/ROXICODONE Take 1 tablet (5 mg total) by mouth every 6 (six) hours as needed for severe pain.   pantoprazole 40 MG tablet Commonly known as: PROTONIX Take 1 tablet (40 mg total) by mouth at bedtime.   PROBIOTIC PO Take 1 tablet by mouth daily.   tamsulosin 0.4 MG Caps capsule Commonly known as: FLOMAX Take 0.4 mg by mouth daily.    VITAMIN B-12 PO Take 5,000 mcg by mouth daily.           Objective:   Physical Exam BP (!) 153/80 (BP Location: Left Arm, Patient Position: Sitting, Cuff Size: Large)   Pulse 99   Temp 97.8 F (36.6 C) (Temporal)   Resp 16   Ht _0  (1.676 m)  Wt 213 lb 3.2 oz (96.7 kg)   SpO2 96%   BMI 34.41 kg/m   General:   Well developed, NAD, BMI noted. HEENT:  Normocephalic . Face symmetric, atraumatic Lungs:  CTA B Normal respiratory effort, no intercostal retractions, no accessory muscle use. Heart: RRR,  no murmur.  No pretibial edema bilaterally  Skin: Not pale. Not jaundice Neurologic:  alert & oriented X3.  Speech normal, gait: Assisted by a cane, limited by pain. Psych--  Cognition and judgment appear intact.  Cooperative with normal attention span and concentration.  Behavior appropriate. No anxious or depressed appearing.      Assessment      Assessment Pre-diabetes HTN Hyperlipidemia (self dc  simva 11-2014)  CAD by cath 1998, medical management, Cardiolite ~ 2007 ? Multiple myeloma -- stem cell transplant 2011, with recurrence as off 01-2015  GI: --Barrett' esophagus EGD 2010, 2013 --PUD 2010  [facility] -- generalized aches, pains, chronic (no better after dc simvastatin ~ 11-2014) -- DJD Dr Percell Miller Hypogonadism Carotid bruit, [place] (-) 2011  PLAN HTN: On metoprolol, BP today is a slightly elevated but I review previous BPs and they were okay.  No change DJD: needs  revision of right TKA, this year he tolerated to shoulder replacements without problems, the patient tells me that his oncology has cleared him. He denies any chest pain or difficulty breathing with ADLs noting that his mobility is limited. Plan: Cleared from my standpoint, needs  clearance from his cardiologist Refill methocarbamol, he takes 1 or 2 a day at most, request a 31-monthsupply Multiple myeloma: Saw oncology 12/22/2018  RTC CPX is scheduled for 03-2019   This visit occurred during the  SARS-CoV-2 public health emergency.  Safety protocols were in place, including screening questions prior to the visit, additional usage of staff PPE, and extensive cleaning of exam room while observing appropriate contact time as indicated for disinfecting solutions.

## 2019-01-19 NOTE — Telephone Encounter (Signed)
   Primary Cardiologist: Previously Dr Wynonia Lawman  Chart reviewed as part of pre-operative protocol coverage. Because of Gene Hill past medical history and time since last visit, he/she will require a follow-up visit in order to better assess preoperative cardiovascular risk.  Pre-op covering staff: - Please schedule appointment with an MD or APP ASAP and call patient to inform them. - Please contact requesting surgeon's office via preferred method (i.e, phone, fax) to inform them of need for appointment prior to surgery.  If applicable, this message will also be routed to pharmacy pool and/or primary cardiologist for input on holding anticoagulant/antiplatelet agent as requested below so that this information is available at time of patient's appointment.   Kerin Ransom, PA-C  01/19/2019, 10:18 AM

## 2019-01-20 DIAGNOSIS — M19012 Primary osteoarthritis, left shoulder: Secondary | ICD-10-CM | POA: Diagnosis not present

## 2019-01-20 DIAGNOSIS — M25612 Stiffness of left shoulder, not elsewhere classified: Secondary | ICD-10-CM | POA: Diagnosis not present

## 2019-01-20 DIAGNOSIS — M25512 Pain in left shoulder: Secondary | ICD-10-CM | POA: Diagnosis not present

## 2019-01-20 DIAGNOSIS — M6281 Muscle weakness (generalized): Secondary | ICD-10-CM | POA: Diagnosis not present

## 2019-01-20 NOTE — Assessment & Plan Note (Signed)
HTN: On metoprolol, BP today is a slightly elevated but I review previous BPs and they were okay.  No change DJD: needs  revision of right TKA, this year he tolerated to shoulder replacements without problems, the patient tells me that his oncology has cleared him. He denies any chest pain or difficulty breathing with ADLs noting that his mobility is limited. Plan: Cleared from my standpoint, needs  clearance from his cardiologist Refill methocarbamol, he takes 1 or 2 a day at most, request a 22-monthsupply Multiple myeloma: Saw oncology 12/22/2018  RTC CPX is scheduled for 03-2019

## 2019-01-21 NOTE — Telephone Encounter (Signed)
Appt scheduled on 01/22/19 with Dr. Audie Box.

## 2019-01-21 NOTE — Progress Notes (Signed)
Cardiology Office Note:   Date:  01/22/2019  NAME:  Gene Hill    MRN: [id number] DOB:  07-30-1940   PCP:  Colon Branch, MD  Cardiologist:  No primary care provider on file.   Referring MD: Colon Branch, MD   Chief Complaint  Patient presents with  . Pre-op Exam   History of Present Illness:   Gene Hill is a 78 y.o. male with a hx of arthritis, multiple myeloma s/p BMT, HTN, HLD who is being seen today for the evaluation of preoperative assessment at the request of Colon Branch, MD. Will have knee replacement surgery. Echo 2017 unremarkable. NM stress test 2017 negative.  He reports he has been doing well since his last cardiology visit.  He has had no major cardiac issues.  He has had recent shoulder surgery in June and October of this year.  No issues with anesthesia or in the postoperative.  No history of atrial fibrillation.  He is going to have a redo of his right knee replacement.  It apparently has dislocated.  His surgeon would like to proceed quickly.  He reports he has no chest pain or trouble breathing.  He has no issues walking around the house or doing any strenuous activity.  He is not that active.  But nonetheless has no symptoms of chest pain or trouble breathing.  His EKG is very normal.  Most recent echocardiogram in stress test were normal as well.  He has had no major changes symptoms.  And tolerated recent procedures well.  Review of laboratory data shows an A1c of 6.0, LDL ninety, takes Lipitor 10 mg daily.  Blood pressure is a bit elevated today but given pain from his leg we will let this pass.  T chol 171 TG 172 HDL 46 LDL 90 A1c 6.0  Past Medical History: Past Medical History:  Diagnosis Date  . Arthritis   . 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 Blood dyscrasia    low WBC d/t multiple myeloma  . Bulging lumbar disc    L2-3  . Carotid bruit    3-11 carotid u/s was  (-)  . CORONARY ARTERY DISEASE 05/03/2006   cath 1998, medical managment,  cardiolite neg 3-07  . GERD (gastroesophageal reflux disease)   . Headache   . Heart murmur    doesn't have one  . HYPERLIPIDEMIA 05/03/2006  . HYPERTENSION 05/03/2006  . 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 Torn rotator cuff 01/22/2017   left- [place]    Past Surgical History: Past Surgical History:  Procedure Laterality Date  . BACK SURGERY    . CERVICAL FUSION    . IR FLUORO GUIDE PORT INSERTION RIGHT  11/29/2016  . IR US GUIDE VASC ACCESS RIGHT  11/29/2016  . [place]   right  . REVERSE SHOULDER ARTHROPLASTY Right 06/25/2018   Procedure: REVERSE SHOULDER ARTHROPLASTY;  Surgeon: Hiram Gash, MD;  Location: [facility];  Service: Orthopedics;  Laterality: Right;  . REVERSE SHOULDER ARTHROPLASTY Left 11/19/2018   Procedure: REVERSE SHOULDER ARTHROPLASTY;  Surgeon: Hiram Gash, MD;  Location: [facility];  Service: Orthopedics;  Laterality: Left;  . ROTATOR CUFF REPAIR     right  . SPINAL FUSION     lumbar  .  TOTAL KNEE ARTHROPLASTY Right 12/23/2015   Procedure: TOTAL KNEE ARTHROPLASTY;  Surgeon: Earlie Server, MD;  Location: [facility];  Service: Orthopedics;  Laterality: Right;    Current Medications: Current Meds  Medication Sig  . atorvastatin (LIPITOR) 10 MG tablet Take 1 tablet (10 mg total) by mouth at bedtime.  Marland Kitchen b complex vitamins tablet Take 1 tablet by mouth daily.  . calcium carbonate (OSCAL) 1500 (600 Ca) MG TABS tablet Take 600 mg of elemental calcium by mouth daily with breakfast.   . Cyanocobalamin (VITAMIN B-12 PO) Take 5,000 mcg by mouth daily.   . Daratumumab (DARZALEX IV) Inject into the vein every 6 (six) weeks.   Marland Kitchen dexamethasone (DECADRON) 2 MG tablet TAKE 2 TABLETS BY MOUTH DAY AFTER CHEMO THEN 1 TABLET EVERY DAY FOR 4 DAYS (Patient taking differently: Take 2-4 mg by mouth See admin instructions. TAKE 8 MG  BY MOUTH DAY AFTER CHEMO THEN 2 MG EVERY DAY FOR 4 DAYS)  . fluticasone (FLONASE) 50 MCG/ACT nasal spray Place 1 spray into both nostrils at bedtime.   Marland Kitchen ketorolac in sodium chloride infusion SMARTSIG:2 Milliliter(s) IM Once PRN  . methocarbamol (ROBAXIN) 500 MG tablet Take 1 tablet (500 mg total) by mouth 3 (three) times daily as needed for muscle spasms.  . metoprolol tartrate (LOPRESSOR) 100 MG tablet Take 0.5 tablets (50 mg total) by mouth 2 (two) times daily.  . Multiple Vitamin (MULTIVITAMIN) tablet Take 1 tablet by mouth daily.  . Omega-3 Fatty Acids (FISH OIL) 1200 MG CAPS Take 1,200 mg by mouth daily.  Marland Kitchen oxyCODONE (OXY IR/ROXICODONE) 5 MG immediate release tablet Take 1 tablet (5 mg total) by mouth every 6 (six) hours as needed for severe pain.  . pantoprazole (PROTONIX) 40 MG tablet Take 1 tablet (40 mg total) by mouth at bedtime.  . Probiotic Product (PROBIOTIC PO) Take 1 tablet by mouth daily.   . tamsulosin (FLOMAX) 0.4 MG CAPS capsule Take 0.4 mg by mouth daily.     Allergies:    Patient has no known allergies.   Social History: Social History   Socioeconomic History  . Marital status: Married    Spouse name: Not on file  . Number of children: 4  . Years of education: Not on file  . Highest education level: Not on file  Occupational History  . Occupation: retired  Tobacco Use  . Smoking status: Former Smoker    Packs/day: 0.25    Years: 20.00    Pack years: 5.00    Types: Cigarettes    Start date: 04/28/1954    Quit date: 11/28/1974    Years since quitting: 44.1  . Smokeless tobacco: Never Used  . Tobacco comment: quit 35 years ago, 1976  Substance and Sexual Activity  . Alcohol use: Yes    Alcohol/week: 0.0 standard drinks    Comment: occ  . Drug use: No  . Sexual activity: Never  Other Topics Concern  . Not on file  Social History Narrative   Household-- pt, wife, son   Social Determinants of Health   Financial Resource Strain:   . Difficulty of Paying  Living Expenses: Not on file  Food Insecurity:   . Worried About Charity fundraiser in the Last Year: Not on file  . Ran Out of Food in the Last Year: Not on file  Transportation Needs:   . Lack of Transportation (Medical): Not on file  . Lack of Transportation (Non-Medical): Not on file  Physical Activity:   .  Days of Exercise per Week: Not on file  . Minutes of Exercise per Session: Not on file  Stress:   . Feeling of Stress : Not on file  Social Connections:   . Frequency of Communication with Friends and Family: Not on file  . Frequency of Social Gatherings with Friends and Family: Not on file  . Attends Religious Services: Not on file  . Active Member of Clubs or Organizations: Not on file  . Attends Archivist Meetings: Not on file  . Marital Status: Not on file     Family History: The patient's family history includes Cerebral palsy in his son; Heart attack in his maternal grandfather; Kidney cancer in his father; Leukemia in his mother; Pulmonary embolism in his brother; Stroke in his maternal grandmother and paternal grandmother. There is no history of Prostate cancer, Colon cancer, Esophageal cancer, Rectal cancer, or Stomach cancer.  ROS:   All other ROS reviewed and negative. Pertinent positives noted in the HPI.     EKGs/Labs/Other Studies Reviewed:   The following studies were personally reviewed by me today:  EKG:  EKG is ordered today.  The ekg ordered today demonstrates normal sinus rhythm, heart rate 77, normal intervals, no acute ST-T changes, no evidence of prior infarction, extremely normal, and was personally reviewed by me.   NM MPI 2017 1. No reversible ischemia or infarction.  2. Normal left ventricular wall motion.  3. Left ventricular ejection fraction 67%  4. Non invasive risk stratification*: Low  TTE 2017 - Left ventricle: The cavity size was normal. There was mild   concentric hypertrophy. Systolic function was normal. The    estimated ejection fraction was in the range of 60% to 65%. Wall   motion was normal; there were no regional wall motion   abnormalities. Doppler parameters are consistent with abnormal   left ventricular relaxation (grade 1 diastolic dysfunction).   Doppler parameters are consistent with indeterminate ventricular   filling pressure. - Aortic valve: Transvalvular velocity was within the normal range.   There was no stenosis. There was no regurgitation. - Mitral valve: Calcified annulus. Transvalvular velocity was   within the normal range. There was no evidence for stenosis.   There was mild regurgitation. - Right ventricle: The cavity size was normal. Wall thickness was   normal. Systolic function was normal. - Atrial septum: A patent foramen ovale cannot be excluded. - Tricuspid valve: There was mild regurgitation. - Pulmonary arteries: Systolic pressure was within the normal   range. PA peak pressure: 28 mm Hg (S).  Recent Labs: 12/22/2018: ALT 14; BUN 24; Creatinine 1.03; Hemoglobin 10.7; Platelet Count 145; Potassium 4.0; Sodium 140   Recent Lipid Panel    Component Value Date/Time   CHOL 171 05/12/2018 0747   TRIG 172.0 (H) 05/12/2018 0747   TRIG 129 01/28/2006 0900   HDL 46.50 05/12/2018 0747   CHOLHDL 4 05/12/2018 0747   VLDL 34.4 05/12/2018 0747   LDLCALC 90 05/12/2018 0747   LDLDIRECT 156.0 03/27/2018 0846    Physical Exam:   VS:  BP (!) 187/91   Pulse 76   Temp (!) 96.8 F (36 C)   Ht '5\' 5"'$  (1.651 m)   Wt 212 lb (96.2 kg)   SpO2 97%   BMI 35.28 kg/m    Wt Readings from Last 3 Encounters:  01/22/19 212 lb (96.2 kg)  01/19/19 213 lb 3.2 oz (96.7 kg)  12/22/18 214 lb (97.1 kg)    General: Well nourished,  well developed, in no acute distress Heart: Atraumatic, normal size  Eyes: PEERLA, EOMI  Neck: Supple, no JVD Endocrine: No thryomegaly Cardiac: Normal S1, S2; RRR; no murmurs, rubs, or gallops Lungs: Clear to auscultation bilaterally, no wheezing,  rhonchi or rales  Abd: Soft, nontender, no hepatomegaly  Ext: No edema, pulses 2+ Musculoskeletal: No deformities, BUE and BLE strength normal and equal Skin: Warm and dry, no rashes   Neuro: Alert and oriented to person, place, time, and situation, CNII-XII grossly intact, no focal deficits  Psych: Normal mood and affect   ASSESSMENT:   Gene Hill is a 78 y.o. male who presents for the following: 1. Preoperative cardiovascular examination   2. Essential hypertension   3. Mixed hyperlipidemia     PLAN:   1. Preoperative cardiovascular examination Preoperative Risk Assessment - The Revised Cardiac Risk Index = 0,  which equates to 0.4% estimated risk of perioperative myocardial infarction, pulmonary edema, ventricular fibrillation, cardiac arrest, or complete heart block.  - No further cardiac testing is recommended prior to surgery.  - The patient may proceed to surgery at acceptable risk.    - Our service is available as needed in the peri-operative period.  -He can complete greater than four METS without any issues.  2. Essential hypertension -We will keep an eye on this.  I suspect this is pain related.  3. Mixed hyperlipidemia -Continue statin   Disposition: Return if symptoms worsen or fail to improve.  Medication Adjustments/Labs and Tests Ordered: Current medicines are reviewed at length with the patient today.  Concerns regarding medicines are outlined above.  Orders Placed This Encounter  Procedures  . EKG 12-Lead   No orders of the defined types were placed in this encounter.   Patient Instructions  Medication Instructions:  NO CHANGES *If you need a refill on your cardiac medications before your next appointment, please call your pharmacy*  Lab Work: NONE NEEDED  Testing/Procedures: NONE NEEDED  Follow-Up: At [facility], you and your health needs are our priority.  As part of our continuing mission to provide you with exceptional heart care,  we have created designated Provider Care Teams.  These Care Teams include your primary Cardiologist (physician) and Advanced Practice Providers (APPs -  Physician Assistants and Nurse Practitioners) who all work together to provide you with the care you need, when you need it.  Other Instructions FOLLOW UP AS NEEDED WITH CARDIOLOGY     Signed, Lake Bells T. Audie Box, [place]  [address] [place], [place] [address] [phone]  01/22/2019 11:59 AM

## 2019-01-22 ENCOUNTER — Encounter: Payer: Self-pay | Admitting: Cardiovascular Disease

## 2019-01-22 ENCOUNTER — Other Ambulatory Visit: Payer: Self-pay

## 2019-01-22 ENCOUNTER — Telehealth: Payer: Self-pay

## 2019-01-22 ENCOUNTER — Ambulatory Visit (INDEPENDENT_AMBULATORY_CARE_PROVIDER_SITE_OTHER): Payer: PPO | Admitting: Cardiovascular Disease

## 2019-01-22 VITALS — BP 187/91 | HR 76 | Temp 96.8°F | Ht 65.0 in | Wt 212.0 lb

## 2019-01-22 DIAGNOSIS — E782 Mixed hyperlipidemia: Secondary | ICD-10-CM | POA: Diagnosis not present

## 2019-01-22 DIAGNOSIS — I10 Essential (primary) hypertension: Secondary | ICD-10-CM

## 2019-01-22 DIAGNOSIS — M25612 Stiffness of left shoulder, not elsewhere classified: Secondary | ICD-10-CM | POA: Diagnosis not present

## 2019-01-22 DIAGNOSIS — M6281 Muscle weakness (generalized): Secondary | ICD-10-CM | POA: Diagnosis not present

## 2019-01-22 DIAGNOSIS — M25512 Pain in left shoulder: Secondary | ICD-10-CM | POA: Diagnosis not present

## 2019-01-22 DIAGNOSIS — Z01810 Encounter for preprocedural cardiovascular examination: Secondary | ICD-10-CM | POA: Diagnosis not present

## 2019-01-22 DIAGNOSIS — M19012 Primary osteoarthritis, left shoulder: Secondary | ICD-10-CM | POA: Diagnosis not present

## 2019-01-22 NOTE — Patient Instructions (Signed)
Medication Instructions:  NO CHANGES *If you need a refill on your cardiac medications before your next appointment, please call your pharmacy*  Lab Work: NONE NEEDED  Testing/Procedures: NONE NEEDED  Follow-Up: At [facility], you and your health needs are our priority.  As part of our continuing mission to provide you with exceptional heart care, we have created designated Provider Care Teams.  These Care Teams include your primary Cardiologist (physician) and Advanced Practice Providers (APPs -  Physician Assistants and Nurse Practitioners) who all work together to provide you with the care you need, when you need it.  Other Instructions FOLLOW UP AS NEEDED WITH CARDIOLOGY

## 2019-01-22 NOTE — Telephone Encounter (Signed)
Pre-op clearance faxed to [facility] - cardiac clearance given.

## 2019-01-27 DIAGNOSIS — M25612 Stiffness of left shoulder, not elsewhere classified: Secondary | ICD-10-CM | POA: Diagnosis not present

## 2019-01-27 DIAGNOSIS — Z471 Aftercare following joint replacement surgery: Secondary | ICD-10-CM | POA: Diagnosis not present

## 2019-01-27 DIAGNOSIS — M6281 Muscle weakness (generalized): Secondary | ICD-10-CM | POA: Diagnosis not present

## 2019-01-27 DIAGNOSIS — M19012 Primary osteoarthritis, left shoulder: Secondary | ICD-10-CM | POA: Diagnosis not present

## 2019-01-28 ENCOUNTER — Inpatient Hospital Stay: Payer: PPO

## 2019-01-28 ENCOUNTER — Inpatient Hospital Stay: Payer: PPO | Attending: Hematology & Oncology

## 2019-01-28 ENCOUNTER — Encounter: Payer: Self-pay | Admitting: Hematology & Oncology

## 2019-01-28 ENCOUNTER — Other Ambulatory Visit: Payer: Self-pay

## 2019-01-28 ENCOUNTER — Telehealth: Payer: Self-pay | Admitting: Hematology & Oncology

## 2019-01-28 ENCOUNTER — Inpatient Hospital Stay (HOSPITAL_BASED_OUTPATIENT_CLINIC_OR_DEPARTMENT_OTHER): Payer: PPO | Admitting: Hematology & Oncology

## 2019-01-28 VITALS — BP 154/79 | HR 76 | Temp 97.1°F | Resp 18 | Wt 192.0 lb

## 2019-01-28 DIAGNOSIS — C9000 Multiple myeloma not having achieved remission: Secondary | ICD-10-CM | POA: Diagnosis not present

## 2019-01-28 DIAGNOSIS — Z5112 Encounter for antineoplastic immunotherapy: Secondary | ICD-10-CM | POA: Insufficient documentation

## 2019-01-28 DIAGNOSIS — C9002 Multiple myeloma in relapse: Secondary | ICD-10-CM | POA: Insufficient documentation

## 2019-01-28 DIAGNOSIS — Z79899 Other long term (current) drug therapy: Secondary | ICD-10-CM | POA: Insufficient documentation

## 2019-01-28 LAB — CBC WITH DIFFERENTIAL (CANCER CENTER ONLY)
Abs Immature Granulocytes: 0.02 10*3/uL (ref 0.00–0.07)
Basophils Absolute: 0 10*3/uL (ref 0.0–0.1)
Basophils Relative: 1 %
Eosinophils Absolute: 0.1 10*3/uL (ref 0.0–0.5)
Eosinophils Relative: 1 %
HCT: 33.6 % — ABNORMAL LOW (ref 39.0–52.0)
Hemoglobin: 10.8 g/dL — ABNORMAL LOW (ref 13.0–17.0)
Immature Granulocytes: 0 %
Lymphocytes Relative: 25 %
Lymphs Abs: 1.7 10*3/uL (ref 0.7–4.0)
MCH: 33.1 pg (ref 26.0–34.0)
MCHC: 32.1 g/dL (ref 30.0–36.0)
MCV: 103.1 fL — ABNORMAL HIGH (ref 80.0–100.0)
Monocytes Absolute: 0.7 10*3/uL (ref 0.1–1.0)
Monocytes Relative: 11 %
Neutro Abs: 4.1 10*3/uL (ref 1.7–7.7)
Neutrophils Relative %: 62 %
Platelet Count: 162 10*3/uL (ref 150–400)
RBC: 3.26 MIL/uL — ABNORMAL LOW (ref 4.22–5.81)
RDW: 14.3 % (ref 11.5–15.5)
WBC Count: 6.7 10*3/uL (ref 4.0–10.5)
nRBC: 0 % (ref 0.0–0.2)

## 2019-01-28 LAB — CMP (CANCER CENTER ONLY)
ALT: 16 U/L (ref 0–44)
AST: 13 U/L — ABNORMAL LOW (ref 15–41)
Albumin: 4.1 g/dL (ref 3.5–5.0)
Alkaline Phosphatase: 37 U/L — ABNORMAL LOW (ref 38–126)
Anion gap: 7 (ref 5–15)
BUN: 21 mg/dL (ref 8–23)
CO2: 28 mmol/L (ref 22–32)
Calcium: 9.1 mg/dL (ref 8.9–10.3)
Chloride: 104 mmol/L (ref 98–111)
Creatinine: 0.89 mg/dL (ref 0.61–1.24)
GFR, Est AFR Am: >60 mL/min (ref >60)
GFR, Estimated: >60 mL/min (ref >60)
Glucose, Bld: 101 mg/dL — ABNORMAL HIGH (ref 70–99)
Potassium: 3.9 mmol/L (ref 3.5–5.1)
Sodium: 139 mmol/L (ref 135–145)
Total Bilirubin: 0.4 mg/dL (ref 0.3–1.2)
Total Protein: 6.6 g/dL (ref 6.5–8.1)

## 2019-01-28 MED ORDER — DEXAMETHASONE 4 MG PO TABS
ORAL_TABLET | ORAL | Status: AC
  Filled 2019-01-28: qty 5

## 2019-01-28 MED ORDER — OXYCODONE HCL 5 MG PO TABS: 5.0000 mg | ORAL_TABLET | Freq: Four times a day (QID) | ORAL | 0 refills | Status: DC | PRN

## 2019-01-28 MED ORDER — ACETAMINOPHEN 325 MG PO TABS
ORAL_TABLET | ORAL | Status: AC
  Filled 2019-01-28: qty 2

## 2019-01-28 MED ORDER — DARATUMUMAB-HYALURONIDASE-FIHJ 1800-30000 MG-UT/15ML ~~LOC~~ SOLN
1800.0000 mg | Freq: Once | SUBCUTANEOUS | Status: AC
  Administered 2019-01-28: 1800 mg via SUBCUTANEOUS
  Filled 2019-01-28: qty 15

## 2019-01-28 MED ORDER — PROCHLORPERAZINE MALEATE 10 MG PO TABS
10.0000 mg | ORAL_TABLET | Freq: Once | ORAL | Status: AC
  Administered 2019-01-28: 10 mg via ORAL

## 2019-01-28 MED ORDER — DIPHENHYDRAMINE HCL 25 MG PO CAPS
ORAL_CAPSULE | ORAL | Status: AC
  Filled 2019-01-28: qty 2

## 2019-01-28 MED ORDER — DIPHENHYDRAMINE HCL 25 MG PO CAPS
50.0000 mg | ORAL_CAPSULE | Freq: Once | ORAL | Status: AC
  Administered 2019-01-28: 50 mg via ORAL

## 2019-01-28 MED ORDER — PROCHLORPERAZINE MALEATE 10 MG PO TABS
ORAL_TABLET | ORAL | Status: AC
  Filled 2019-01-28: qty 1

## 2019-01-28 MED ORDER — DEXAMETHASONE 4 MG PO TABS
20.0000 mg | ORAL_TABLET | Freq: Once | ORAL | Status: AC
  Administered 2019-01-28: 20 mg via ORAL

## 2019-01-28 MED ORDER — ACETAMINOPHEN 325 MG PO TABS
650.0000 mg | ORAL_TABLET | Freq: Once | ORAL | Status: AC
  Administered 2019-01-28: 10:00:00 650 mg via ORAL

## 2019-01-28 NOTE — Patient Instructions (Signed)

## 2019-01-28 NOTE — Patient Instructions (Signed)
[facility] Discharge Instructions for Patients Receiving Chemotherapy  Today you received the following chemotherapy agents:  Darzalex  To help prevent nausea and vomiting after your treatment, we encourage you to take your nausea medication as prescribed.   If you develop nausea and vomiting that is not controlled by your nausea medication, call the clinic.   BELOW ARE SYMPTOMS THAT SHOULD BE REPORTED IMMEDIATELY:  *FEVER GREATER THAN 100.5 F  *CHILLS WITH OR WITHOUT FEVER  NAUSEA AND VOMITING THAT IS NOT CONTROLLED WITH YOUR NAUSEA MEDICATION  *UNUSUAL SHORTNESS OF BREATH  *UNUSUAL BRUISING OR BLEEDING  TENDERNESS IN MOUTH AND THROAT WITH OR WITHOUT PRESENCE OF ULCERS  *URINARY PROBLEMS  *BOWEL PROBLEMS  UNUSUAL RASH Items with * indicate a potential emergency and should be followed up as soon as possible.  Feel free to call the clinic should you have any questions or concerns. The clinic phone number is [phone].  Please show the CHEMO ALERT CARD at check-in to the Emergency Department and triage nurse.   

## 2019-01-28 NOTE — Addendum Note (Signed)
Addended by: Burney Gauze R on: 01/28/2019 10:13 AM   Modules accepted: Orders

## 2019-01-28 NOTE — Progress Notes (Addendum)
Hematology and Oncology Follow Up Visit  YSMAEL SALINGER [id number] [birth year] 79 y.o. 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 Hendrie is here today for follow-up.  His problem now is that avascular need to have right knee surgery.  He has had surgery on this knee before.  Apparently, there is a "disc" that is popping out from the knee.  It is causing quite a bit of pain.  He has a knee brace on right now.  From my point of view, I do not see any problems with him having the surgery.  His blood counts are okay.  I do not think there will be an issue with respect to healing.  As far as myeloma is concerned, his last M spike was up to 0.9 g/dL.  This might be partly from inflammation.  We will have to see what the M spike is today.  If the M spike is further elevated, we may have to add carfilzomib to the daratumumab.  He has lost a little bit of weight.  He did have a nice holiday season.  Thankfully, his wife comes in with him.  He has had no change in bowel or bladder habits.  He has had no cough.  There is been no rashes.  He has had no nausea or vomiting.  Overall, his performance status is ECOG 1.     Medications:  Allergies as of 01/28/2019   No Known Allergies     Medication List       Accurate as of January 28, 2019  9:04 AM. If you have any questions, ask your nurse or doctor.        atorvastatin 10 MG tablet Commonly known as: LIPITOR Take 1 tablet (10 mg total) by mouth at bedtime.   b complex vitamins tablet Take 1 tablet by mouth daily.   calcium carbonate 1500 (600 Ca) MG Tabs tablet Commonly known as: OSCAL Take 600 mg of elemental calcium by mouth daily with breakfast.   DARZALEX  IV Inject into the vein every 6 (six) weeks.   dexamethasone 2 MG tablet Commonly known as: DECADRON TAKE 2 TABLETS BY MOUTH DAY AFTER CHEMO THEN 1 TABLET EVERY DAY FOR 4 DAYS What changed:   how much to take  how to take this  when to take this  additional instructions   Fish Oil 1200 MG Caps Take 1,200 mg by mouth daily.   fluticasone 50 MCG/ACT nasal spray Commonly known as: FLONASE Place 1 spray into both nostrils at bedtime.   ketorolac in sodium chloride infusion SMARTSIG:2 Milliliter(s) IM Once PRN   methocarbamol 500 MG tablet Commonly known as: ROBAXIN Take 1 tablet (500 mg total) by mouth 3 (three) times daily as needed for muscle spasms.   metoprolol tartrate 100 MG tablet Commonly known as: LOPRESSOR Take 0.5 tablets (50 mg total) by mouth 2 (two) times daily.   multivitamin tablet Take 1 tablet by mouth daily.   oxyCODONE 5 MG immediate release tablet Commonly known as: Oxy IR/ROXICODONE Take 1 tablet (5 mg total) by mouth every 6 (six) hours as needed for severe pain.   pantoprazole 40  MG tablet Commonly known as: PROTONIX Take 1 tablet (40 mg total) by mouth at bedtime.   PROBIOTIC PO Take 1 tablet by mouth daily.   tamsulosin 0.4 MG Caps capsule Commonly known as: FLOMAX Take 0.4 mg by mouth daily.   VITAMIN B-12 PO Take 5,000 mcg by mouth daily.       Allergies: No Known Allergies  Past Medical History, Surgical history, Social history, and Family History were reviewed and updated.  Review of Systems: Review of Systems  Constitutional: Negative.   HENT: Negative.   Eyes: Negative.   Respiratory: Negative.   Cardiovascular: Negative.   Gastrointestinal: Negative.   Genitourinary: Negative.   Musculoskeletal: Positive for back pain and joint pain.  Skin: Negative.   Neurological: Negative.   Endo/Heme/Allergies: Negative.   Psychiatric/Behavioral: Negative.       Physical Exam:  weight is 192 lb (87.1 kg). His temporal  temperature is 97.1 F (36.2 C) (abnormal). His blood pressure is 154/79 (abnormal) and his pulse is 76. His respiration is 18 and oxygen saturation is 98%.   Wt Readings from Last 3 Encounters:  01/28/19 192 lb (87.1 kg)  01/22/19 212 lb (96.2 kg)  01/19/19 213 lb 3.2 oz (96.7 kg)  Is  Physical Exam Vitals reviewed.  HENT:     Head: Normocephalic and atraumatic.  Eyes:     Pupils: Pupils are equal, round, and reactive to light.  Cardiovascular:     Rate and Rhythm: Normal rate and regular rhythm.     Heart sounds: Normal heart sounds.  Pulmonary:     Effort: Pulmonary effort is normal.     Breath sounds: Normal breath sounds.  Abdominal:     General: Bowel sounds are normal.     Palpations: Abdomen is soft.  Musculoskeletal:        General: No tenderness or deformity. Normal range of motion.     Cervical back: Normal range of motion.  Lymphadenopathy:     Cervical: No cervical adenopathy.  Skin:    General: Skin is warm and dry.     Findings: No erythema or rash.  Neurological:     Mental Status: He is alert and oriented to person, place, and time.  Psychiatric:        Behavior: Behavior normal.        Thought Content: Thought content normal.        Judgment: Judgment normal.      Lab Results  Component Value Date   WBC 6.7 01/28/2019   HGB 10.8 (L) 01/28/2019   HCT 33.6 (L) 01/28/2019   MCV 103.1 (H) 01/28/2019   PLT 162 01/28/2019   Lab Results  Component Value Date   FERRITIN 46 11/03/2018   IRON 76 11/03/2018   TIBC 318 11/03/2018   UIBC 241 11/03/2018   IRONPCTSAT 24 11/03/2018   Lab Results  Component Value Date   RBC 3.26 (L) 01/28/2019   Lab Results  Component Value Date   KPAFRELGTCHN 74.4 (H) 12/22/2018   LAMBDASER 1.9 (L) 12/22/2018   KAPLAMBRATIO 39.16 (H) 12/22/2018   Lab Results  Component Value Date   IGGSERUM 139 (L) 12/22/2018   IGGSERUM 156 (L) 12/22/2018   IGA 1,283 (H) 12/22/2018   IGA 1,338 (H) 12/22/2018   IGMSERUM <5  (L) 12/22/2018   IGMSERUM <5 (L) 12/22/2018   Lab Results  Component Value Date   TOTALPROTELP 6.6 12/22/2018   ALBUMINELP 3.6 12/22/2018   A1GS 0.2 12/22/2018   A2GS 0.8 12/22/2018  BETS 1.7 (H) 12/22/2018   BETA2SER 2.0 (H) 12/08/2014   GAMS 0.3 (L) 12/22/2018   MSPIKE 0.9 (H) 12/22/2018   SPEI Comment 09/02/2017     Chemistry      Component Value Date/Time   NA 140 12/22/2018 0820   NA 141 01/21/2017 0741   NA 139 06/14/2016 1246   K 4.0 12/22/2018 0820   K 4.0 01/21/2017 0741   K 3.8 06/14/2016 1246   CL 106 12/22/2018 0820   CL 103 01/21/2017 0741   CO2 26 12/22/2018 0820   CO2 30 01/21/2017 0741   CO2 24 06/14/2016 1246   BUN 24 (H) 12/22/2018 0820   BUN 20 01/21/2017 0741   BUN 15.0 06/14/2016 1246   CREATININE 1.03 12/22/2018 0820   CREATININE 1.0 01/21/2017 0741   CREATININE 0.9 06/14/2016 1246      Component Value Date/Time   CALCIUM 9.0 12/22/2018 0820   CALCIUM 9.0 01/21/2017 0741   CALCIUM 9.6 06/14/2016 1246   ALKPHOS 40 12/22/2018 0820   ALKPHOS 37 01/21/2017 0741   ALKPHOS 54 06/14/2016 1246   AST 15 12/22/2018 0820   AST 25 06/14/2016 1246   ALT 14 12/22/2018 0820   ALT 29 01/21/2017 0741   ALT 21 06/14/2016 1246   BILITOT 0.3 12/22/2018 0820   BILITOT 0.37 06/14/2016 1246      Impression and Plan: Mr. Doughton is a very pleasant 79 yo caucasian gentleman with history of IgA kappa myeloma. He had high risk cytogenetics with a 17p- abnormality.   I believe that the myeloma studies this visit will be very helpful.  Again if we see that the M spike is further elevated, we may have to consider adding carfilzomib.  Of note his IgA level and kappa light chain levels have all been stable.  He will get his Delton See today.  We may have to get him back a little bit sooner than 6 weeks.  I would like to see him back in 4 weeks.  I spent about 30 minutes with him and his wife.  All the time spent face-to-face.  I spent all the time talking to them  about lab results, possible changes in protocol, and care coordination.     [name], MD 1/6/20219:04 AM

## 2019-01-28 NOTE — Telephone Encounter (Signed)
Appointments scheduled calendar printed per 1/6 los 

## 2019-01-29 DIAGNOSIS — M19012 Primary osteoarthritis, left shoulder: Secondary | ICD-10-CM | POA: Diagnosis not present

## 2019-01-29 DIAGNOSIS — M25512 Pain in left shoulder: Secondary | ICD-10-CM | POA: Diagnosis not present

## 2019-01-29 DIAGNOSIS — M6281 Muscle weakness (generalized): Secondary | ICD-10-CM | POA: Diagnosis not present

## 2019-01-29 DIAGNOSIS — M25612 Stiffness of left shoulder, not elsewhere classified: Secondary | ICD-10-CM | POA: Diagnosis not present

## 2019-01-29 LAB — KAPPA/LAMBDA LIGHT CHAINS
Kappa free light chain: 88.1 mg/L — ABNORMAL HIGH (ref 3.3–19.4)
Kappa, lambda light chain ratio: 35.24 — ABNORMAL HIGH (ref 0.26–1.65)
Lambda free light chains: 2.5 mg/L — ABNORMAL LOW (ref 5.7–26.3)

## 2019-01-29 LAB — IGG, IGA, IGM
IgA: 1069 mg/dL — ABNORMAL HIGH (ref 61–437)
IgG (Immunoglobin G), Serum: 109 mg/dL — ABNORMAL LOW (ref 603–1613)
IgM (Immunoglobulin M), Srm: <5 mg/dL — ABNORMAL LOW (ref 15–143)

## 2019-02-02 LAB — IMMUNOFIXATION REFLEX, SERUM
IgA: 1279 mg/dL — ABNORMAL HIGH (ref 61–437)
IgG (Immunoglobin G), Serum: 143 mg/dL — ABNORMAL LOW (ref 603–1613)
IgM (Immunoglobulin M), Srm: <5 mg/dL — ABNORMAL LOW (ref 15–143)

## 2019-02-02 LAB — PROTEIN ELECTROPHORESIS, SERUM, WITH REFLEX
A/G Ratio: 1.2 (ref 0.7–1.7)
Albumin ELP: 3.7 g/dL (ref 2.9–4.4)
Alpha-1-Globulin: 0.2 g/dL (ref 0.0–0.4)
Alpha-2-Globulin: 0.9 g/dL (ref 0.4–1.0)
Beta Globulin: 1.7 g/dL — ABNORMAL HIGH (ref 0.7–1.3)
Gamma Globulin: 0.2 g/dL — ABNORMAL LOW (ref 0.4–1.8)
Globulin, Total: 3 g/dL (ref 2.2–3.9)
M-Spike, %: 0.5 g/dL — ABNORMAL HIGH
SPEP Interpretation: 0
Total Protein ELP: 6.7 g/dL (ref 6.0–8.5)

## 2019-02-03 ENCOUNTER — Telehealth: Payer: Self-pay | Admitting: *Deleted

## 2019-02-03 ENCOUNTER — Telehealth: Payer: Self-pay | Admitting: Hematology & Oncology

## 2019-02-03 DIAGNOSIS — M25612 Stiffness of left shoulder, not elsewhere classified: Secondary | ICD-10-CM | POA: Diagnosis not present

## 2019-02-03 DIAGNOSIS — M19012 Primary osteoarthritis, left shoulder: Secondary | ICD-10-CM | POA: Diagnosis not present

## 2019-02-03 DIAGNOSIS — M25512 Pain in left shoulder: Secondary | ICD-10-CM | POA: Diagnosis not present

## 2019-02-03 DIAGNOSIS — M6281 Muscle weakness (generalized): Secondary | ICD-10-CM | POA: Diagnosis not present

## 2019-02-03 NOTE — Telephone Encounter (Signed)
-----   Message from [name], MD sent at 02/03/2019  7:06 AM EST ----- Call - the myeloma level is back down to 0.5!!!  We really need to do therapy every 4 weeks for right now!!  Gene Hill

## 2019-02-03 NOTE — Telephone Encounter (Signed)
Appointments were rescheduled as requested per 1/12 sch msg . Updated calendar was printed for patient's wife to pick up here today

## 2019-02-03 NOTE — Telephone Encounter (Signed)
Call received back from patient's wife.  Pt.'s wife notified per order of Dr. Marin Olp that "the myeloma level is back down to 0.5!!!  We really need to do therapy every 4 weeks for right now!!"  Pt.'s wife appreciative of call and has no questions or concerns at this time.  Message sent to scheduling.

## 2019-02-05 ENCOUNTER — Ambulatory Visit: Payer: Self-pay | Admitting: Physician Assistant

## 2019-02-05 DIAGNOSIS — M6281 Muscle weakness (generalized): Secondary | ICD-10-CM | POA: Diagnosis not present

## 2019-02-05 DIAGNOSIS — M25512 Pain in left shoulder: Secondary | ICD-10-CM | POA: Diagnosis not present

## 2019-02-05 DIAGNOSIS — M25612 Stiffness of left shoulder, not elsewhere classified: Secondary | ICD-10-CM | POA: Diagnosis not present

## 2019-02-05 DIAGNOSIS — M19012 Primary osteoarthritis, left shoulder: Secondary | ICD-10-CM | POA: Diagnosis not present

## 2019-02-05 NOTE — H&P (Signed)
TOTAL KNEE REVISION ADMISSION H&P  Patient is being admitted for right revision total knee arthroplasty.  Subjective:  Chief Complaint:right knee pain.  HPI: Gene Hill, 79 y.o. male, has a history of pain and functional disability in the right knee(s) due to failed previous arthroplasty and patient has failed non-surgical conservative treatments for greater than 12 weeks to include use of assistive devices and activity modification. The indications for the revision of the total knee arthroplasty are loosening of one or more components. Onset of symptoms was abrupt starting 1 month ago with gradually worsening course since that time.  Prior procedures on the right knee(s) include arthroplasty.  Patient currently rates pain in the right knee(s) at 8 out of 10 with activity. There is night pain, worsening of pain with activity and weight bearing, pain that interferes with activities of daily living, pain with passive range of motion and joint swelling.  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 This condition presents safety issues increasing the risk of falls.There is no current active infection.  Patient Active Problem List   Diagnosis Date Noted  . 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 Primary localized osteoarthritis of right knee 12/23/2015  . Acute left-sided thoracic back pain   . Atherosclerosis of native coronary artery of native heart without angina pectoris   . PCP NOTES >>>>>>>>>>>>>>>>>>>>>>>> 08/22/2015  . Arthritis-- aches, pain 02/05/2014  . Dizziness 02/05/2014  . 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 Annual physical exam 08/14/2010  . 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 Multiple myeloma not having achieved remission ([place]) 04/15/2009  . CAROTID BRUIT 04/14/2009  . BARRETTS  ESOPHAGUS 08/18/2008  . DIVERTICULOSIS, COLON 08/13/2008  . Backache 07/22/2006  . FATIGUE 07/22/2006  . Hyperlipidemia 05/03/2006  . Essential hypertension 05/03/2006  . Coronary atherosclerosis 05/03/2006  . COLONOSCOPY WITH BIOPSY, HX OF 09/22/2002   Past Medical History:  Diagnosis Date  . Arthritis   . 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 Blood dyscrasia    low WBC d/t multiple myeloma  . Bulging lumbar disc    L2-3  . Carotid bruit    3-11 carotid u/s was  (-)  . 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 GERD (gastroesophageal reflux disease)   . Headache   . Heart murmur    doesn't have one  . HYPERLIPIDEMIA 05/03/2006  . HYPERTENSION 05/03/2006  . 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 Torn rotator cuff 01/22/2017   left- [facility]    Past Surgical History:  Procedure Laterality Date  . BACK SURGERY    . CERVICAL FUSION    . IR FLUORO GUIDE PORT INSERTION RIGHT  11/29/2016  . IR US GUIDE VASC ACCESS RIGHT  11/29/2016  . [place]   right  . REVERSE SHOULDER ARTHROPLASTY Right 06/25/2018   Procedure: REVERSE SHOULDER ARTHROPLASTY;  Surgeon: Hiram Gash, MD;  Location: [facility];  Service: Orthopedics;  Laterality: Right;  . REVERSE SHOULDER ARTHROPLASTY Left 11/19/2018   Procedure: REVERSE SHOULDER ARTHROPLASTY;  Surgeon: Hiram Gash, MD;  Location: [facility];  Service: Orthopedics;  Laterality: Left;  .  ROTATOR CUFF REPAIR     right  . SPINAL FUSION     lumbar  . TOTAL KNEE ARTHROPLASTY Right 12/23/2015   Procedure: TOTAL KNEE ARTHROPLASTY;  Surgeon: Earlie Server, MD;  Location: [facility];  Service: Orthopedics;  Laterality: Right;    Current Outpatient Medications  Medication Sig Dispense Refill Last Dose  . atorvastatin (LIPITOR) 10 MG tablet Take 1  tablet (10 mg total) by mouth at bedtime. 90 tablet 1   . b complex vitamins tablet Take 1 tablet by mouth daily.     . calcium carbonate (OSCAL) 1500 (600 Ca) MG TABS tablet Take 600 mg of elemental calcium by mouth daily with breakfast.      . Cyanocobalamin (VITAMIN B-12 PO) Take 5,000 mcg by mouth daily.      . Daratumumab (DARZALEX IV) Inject into the vein every 28 (twenty-eight) days.      Marland Kitchen dexamethasone (DECADRON) 2 MG tablet TAKE 2 TABLETS BY MOUTH DAY AFTER CHEMO THEN 1 TABLET EVERY DAY FOR 4 DAYS (Patient taking differently: Take 2-4 mg by mouth See admin instructions. TAKE 4 MG BY MOUTH DAY AFTER CHEMO THEN 2 MG EVERY DAY FOR 4 DAYS) 60 tablet 0   . fluticasone (FLONASE) 50 MCG/ACT nasal spray Place 1 spray into both nostrils at bedtime.      . methocarbamol (ROBAXIN) 500 MG tablet Take 1 tablet (500 mg total) by mouth 3 (three) times daily as needed for muscle spasms. 270 tablet 0   . metoprolol tartrate (LOPRESSOR) 100 MG tablet Take 0.5 tablets (50 mg total) by mouth 2 (two) times daily. 90 tablet 3   . Multiple Vitamin (MULTIVITAMIN) tablet Take 1 tablet by mouth daily.     . Omega-3 Fatty Acids (FISH OIL) 1200 MG CAPS Take 1,200 mg by mouth daily.     Marland Kitchen oxyCODONE (OXY IR/ROXICODONE) 5 MG immediate release tablet Take 1 tablet (5 mg total) by mouth every 6 (six) hours as needed for severe pain. 60 tablet 0   . pantoprazole (PROTONIX) 40 MG tablet Take 1 tablet (40 mg total) by mouth at bedtime. 90 tablet 3   . Probiotic Product (PROBIOTIC PO) Take 1 tablet by mouth daily.      . tamsulosin (FLOMAX) 0.4 MG CAPS capsule Take 0.4 mg by mouth daily.      No current facility-administered medications for this visit.   Facility-Administered Medications Ordered in Other Visits  Medication Dose Route Frequency Provider Last Rate Last Admin  . daratumumab (DARZALEX) 1,600 mg in sodium chloride 0.9 % 420 mL (3.2 mg/mL) chemo infusion  15.7 mg/kg (Treatment Plan Recorded) Intravenous Once  [name], MD      . sodium chloride flush (NS) 0.9 % injection 10 mL  10 mL Intracatheter PRN [name], MD   10 mL at 12/18/16 1250  . sodium chloride flush (NS) 0.9 % injection 10 mL  10 mL Intracatheter PRN [name], MD   10 mL at 04/15/17 1113   No Known Allergies  Social History   Tobacco Use  . Smoking status: Former Smoker    Packs/day: 0.25    Years: 20.00    Pack years: 5.00    Types: Cigarettes    Start date: 04/28/1954    Quit date: 11/28/1974    Years since quitting: 44.2  . Smokeless tobacco: Never Used  . Tobacco comment: quit 35 years ago, 1976  Substance Use Topics  . Alcohol use: Yes    Alcohol/week:  0.0 standard drinks    Comment: occ    Family History  Problem Relation Age of Onset  . Kidney cancer Father   . Leukemia Mother   . Pulmonary embolism Brother   . Stroke Maternal Grandmother   . Heart attack Maternal Grandfather   . Stroke Paternal Grandmother   . Cerebral palsy Son   . Prostate cancer Neg Hx   . Colon cancer Neg Hx   . Esophageal cancer Neg Hx   . Rectal cancer Neg Hx   . Stomach cancer Neg Hx       Review of Systems  HENT: Positive for hearing loss.   Musculoskeletal: Positive for arthralgias and joint swelling.  Neurological: Positive for dizziness.     Objective:  Physical Exam  Constitutional: He is oriented to person, place, and time. He appears well-developed and well-nourished. No distress.  HENT:  Head: Normocephalic and atraumatic.  Eyes: Pupils are equal, round, and reactive to light. Conjunctivae and EOM are normal.  Cardiovascular: Normal rate, regular rhythm and intact distal pulses.  Murmur heard. Respiratory: Effort normal and breath sounds normal. No respiratory distress. He has no wheezes.  GI: Soft. Bowel sounds are normal. He exhibits no distension. There is no abdominal tenderness.  Musculoskeletal:     Cervical back: Normal range of motion and neck supple.     Right knee: Swelling  present. No erythema. Decreased range of motion. Tenderness present. Abnormal patellar mobility.     Comments: Palpable loose patellar component anterior medial knee  Neurological: He is alert and oriented to person, place, and time.  Skin: Skin is warm and dry. No rash noted. No erythema.  Psychiatric: He has a normal mood and affect. His behavior is normal.    Vital signs in last 24 hours: @VSRANGES @  Labs:  Estimated body mass index is 31.95 kg/m as calculated from the following:   Height as of 01/22/19: 5' 5"  (1.651 m).   Weight as of 01/28/19: 87.1 kg.  Imaging Review Plain radiographs demonstrate total knee replacement due to  degenerative joint disease of the right knee(s). The overall alignment is neutral.There is evidence of loosening of the patellar components. The bone quality appears to be good for age and reported activity level.    Assessment/Plan:  End stage arthritis, right knee(s) with failed previous arthroplasty.   The patient history, physical examination, clinical judgment of the provider and imaging studies are consistent with end stage degenerative joint disease of the right knee(s), previous total knee arthroplasty. Revision total knee arthroplasty is deemed medically necessary. The treatment options including medical management, injection therapy, arthroscopy and revision arthroplasty were discussed at length. The risks and benefits of revision total knee arthroplasty were presented and reviewed. The risks due to aseptic loosening, infection, stiffness, patella tracking problems, thromboembolic complications and other imponderables were discussed. The patient acknowledged the explanation, agreed to proceed with the plan and consent was signed. Patient is being admitted for inpatient treatment for surgery, pain control, PT, OT, prophylactic antibiotics, VTE prophylaxis, progressive ambulation and ADL's and discharge planning.The patient is planning to be discharged  home with home health services

## 2019-02-05 NOTE — H&P (View-Only) (Signed)
TOTAL KNEE REVISION ADMISSION H&P  Patient is being admitted for right revision total knee arthroplasty.  Subjective:  Chief Complaint:right knee pain.  HPI: Gene Hill, 79 y.o. male, has a history of pain and functional disability in the right knee(s) due to failed previous arthroplasty and patient has failed non-surgical conservative treatments for greater than 12 weeks to include use of assistive devices and activity modification. The indications for the revision of the total knee arthroplasty are loosening of one or more components. Onset of symptoms was abrupt starting 1 month ago with gradually worsening course since that time.  Prior procedures on the right knee(s) include arthroplasty.  Patient currently rates pain in the right knee(s) at 8 out of 10 with activity. There is night pain, worsening of pain with activity and weight bearing, pain that interferes with activities of daily living, pain with passive range of motion and joint swelling.  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 This condition presents safety issues increasing the risk of falls.There is no current active infection.  Patient Active Problem List   Diagnosis Date Noted  . 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 Primary localized osteoarthritis of right knee 12/23/2015  . Acute left-sided thoracic back pain   . Atherosclerosis of native coronary artery of native heart without angina pectoris   . PCP NOTES >>>>>>>>>>>>>>>>>>>>>>>> 08/22/2015  . Arthritis-- aches, pain 02/05/2014  . Dizziness 02/05/2014  . 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 Annual physical exam 08/14/2010  . 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 Multiple myeloma not having achieved remission ([place]) 04/15/2009  . CAROTID BRUIT 04/14/2009  . BARRETTS  ESOPHAGUS 08/18/2008  . DIVERTICULOSIS, COLON 08/13/2008  . Backache 07/22/2006  . FATIGUE 07/22/2006  . Hyperlipidemia 05/03/2006  . Essential hypertension 05/03/2006  . Coronary atherosclerosis 05/03/2006  . COLONOSCOPY WITH BIOPSY, HX OF 09/22/2002   Past Medical History:  Diagnosis Date  . Arthritis   . 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 Blood dyscrasia    low WBC d/t multiple myeloma  . Bulging lumbar disc    L2-3  . Carotid bruit    3-11 carotid u/s was  (-)  . 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 GERD (gastroesophageal reflux disease)   . Headache   . Heart murmur    doesn't have one  . HYPERLIPIDEMIA 05/03/2006  . HYPERTENSION 05/03/2006  . 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 Torn rotator cuff 01/22/2017   left- [facility]    Past Surgical History:  Procedure Laterality Date  . BACK SURGERY    . CERVICAL FUSION    . IR FLUORO GUIDE PORT INSERTION RIGHT  11/29/2016  . IR US GUIDE VASC ACCESS RIGHT  11/29/2016  . [place]   right  . REVERSE SHOULDER ARTHROPLASTY Right 06/25/2018   Procedure: REVERSE SHOULDER ARTHROPLASTY;  Surgeon: Hiram Gash, MD;  [facility];  Service: Orthopedics;  Laterality: Right;  . REVERSE SHOULDER ARTHROPLASTY Left 11/19/2018   Procedure: REVERSE SHOULDER ARTHROPLASTY;  Surgeon: Hiram Gash, MD;  Location[facility];  Service: Orthopedics;  Laterality: Left;  .  ROTATOR CUFF REPAIR     right  . SPINAL FUSION     lumbar  . TOTAL KNEE ARTHROPLASTY Right 12/23/2015   Procedure: TOTAL KNEE ARTHROPLASTY;  Surgeon: Earlie Server, MD;  Location: [facility];  Service: Orthopedics;  Laterality: Right;    Current Outpatient Medications  Medication Sig Dispense Refill Last Dose  . atorvastatin (LIPITOR) 10 MG tablet Take 1  tablet (10 mg total) by mouth at bedtime. 90 tablet 1   . b complex vitamins tablet Take 1 tablet by mouth daily.     . calcium carbonate (OSCAL) 1500 (600 Ca) MG TABS tablet Take 600 mg of elemental calcium by mouth daily with breakfast.      . Cyanocobalamin (VITAMIN B-12 PO) Take 5,000 mcg by mouth daily.      . Daratumumab (DARZALEX IV) Inject into the vein every 28 (twenty-eight) days.      Marland Kitchen dexamethasone (DECADRON) 2 MG tablet TAKE 2 TABLETS BY MOUTH DAY AFTER CHEMO THEN 1 TABLET EVERY DAY FOR 4 DAYS (Patient taking differently: Take 2-4 mg by mouth See admin instructions. TAKE 4 MG BY MOUTH DAY AFTER CHEMO THEN 2 MG EVERY DAY FOR 4 DAYS) 60 tablet 0   . fluticasone (FLONASE) 50 MCG/ACT nasal spray Place 1 spray into both nostrils at bedtime.      . methocarbamol (ROBAXIN) 500 MG tablet Take 1 tablet (500 mg total) by mouth 3 (three) times daily as needed for muscle spasms. 270 tablet 0   . metoprolol tartrate (LOPRESSOR) 100 MG tablet Take 0.5 tablets (50 mg total) by mouth 2 (two) times daily. 90 tablet 3   . Multiple Vitamin (MULTIVITAMIN) tablet Take 1 tablet by mouth daily.     . Omega-3 Fatty Acids (FISH OIL) 1200 MG CAPS Take 1,200 mg by mouth daily.     Marland Kitchen oxyCODONE (OXY IR/ROXICODONE) 5 MG immediate release tablet Take 1 tablet (5 mg total) by mouth every 6 (six) hours as needed for severe pain. 60 tablet 0   . pantoprazole (PROTONIX) 40 MG tablet Take 1 tablet (40 mg total) by mouth at bedtime. 90 tablet 3   . Probiotic Product (PROBIOTIC PO) Take 1 tablet by mouth daily.      . tamsulosin (FLOMAX) 0.4 MG CAPS capsule Take 0.4 mg by mouth daily.      No current facility-administered medications for this visit.   Facility-Administered Medications Ordered in Other Visits  Medication Dose Route Frequency Provider Last Rate Last Admin  . daratumumab (DARZALEX) 1,600 mg in sodium chloride 0.9 % 420 mL (3.2 mg/mL) chemo infusion  15.7 mg/kg (Treatment Plan Recorded) Intravenous Once  [name], MD      . sodium chloride flush (NS) 0.9 % injection 10 mL  10 mL Intracatheter PRN [name], MD   10 mL at 12/18/16 1250  . sodium chloride flush (NS) 0.9 % injection 10 mL  10 mL Intracatheter PRN [name], MD   10 mL at 04/15/17 1113   No Known Allergies  Social History   Tobacco Use  . Smoking status: Former Smoker    Packs/day: 0.25    Years: 20.00    Pack years: 5.00    Types: Cigarettes    Start date: 04/28/1954    Quit date: 11/28/1974    Years since quitting: 44.2  . Smokeless tobacco: Never Used  . Tobacco comment: quit 35 years ago, 1976  Substance Use Topics  . Alcohol use: Yes    Alcohol/week:  0.0 standard drinks    Comment: occ    Family History  Problem Relation Age of Onset  . Kidney cancer Father   . Leukemia Mother   . Pulmonary embolism Brother   . Stroke Maternal Grandmother   . Heart attack Maternal Grandfather   . Stroke Paternal Grandmother   . Cerebral palsy Son   . Prostate cancer Neg Hx   . Colon cancer Neg Hx   . Esophageal cancer Neg Hx   . Rectal cancer Neg Hx   . Stomach cancer Neg Hx       Review of Systems  HENT: Positive for hearing loss.   Musculoskeletal: Positive for arthralgias and joint swelling.  Neurological: Positive for dizziness.     Objective:  Physical Exam  Constitutional: He is oriented to person, place, and time. He appears well-developed and well-nourished. No distress.  HENT:  Head: Normocephalic and atraumatic.  Eyes: Pupils are equal, round, and reactive to light. Conjunctivae and EOM are normal.  Cardiovascular: Normal rate, regular rhythm and intact distal pulses.  Murmur heard. Respiratory: Effort normal and breath sounds normal. No respiratory distress. He has no wheezes.  GI: Soft. Bowel sounds are normal. He exhibits no distension. There is no abdominal tenderness.  Musculoskeletal:     Cervical back: Normal range of motion and neck supple.     Right knee: Swelling  present. No erythema. Decreased range of motion. Tenderness present. Abnormal patellar mobility.     Comments: Palpable loose patellar component anterior medial knee  Neurological: He is alert and oriented to person, place, and time.  Skin: Skin is warm and dry. No rash noted. No erythema.  Psychiatric: He has a normal mood and affect. His behavior is normal.    Vital signs in last 24 hours: @VSRANGES @  Labs:  Estimated body mass index is 31.95 kg/m as calculated from the following:   Height as of 01/22/19: 5' 5"  (1.651 m).   Weight as of 01/28/19: 87.1 kg.  Imaging Review Plain radiographs demonstrate total knee replacement due to  degenerative joint disease of the right knee(s). The overall alignment is neutral.There is evidence of loosening of the patellar components. The bone quality appears to be good for age and reported activity level.    Assessment/Plan:  End stage arthritis, right knee(s) with failed previous arthroplasty.   The patient history, physical examination, clinical judgment of the provider and imaging studies are consistent with end stage degenerative joint disease of the right knee(s), previous total knee arthroplasty. Revision total knee arthroplasty is deemed medically necessary. The treatment options including medical management, injection therapy, arthroscopy and revision arthroplasty were discussed at length. The risks and benefits of revision total knee arthroplasty were presented and reviewed. The risks due to aseptic loosening, infection, stiffness, patella tracking problems, thromboembolic complications and other imponderables were discussed. The patient acknowledged the explanation, agreed to proceed with the plan and consent was signed. Patient is being admitted for inpatient treatment for surgery, pain control, PT, OT, prophylactic antibiotics, VTE prophylaxis, progressive ambulation and ADL's and discharge planning.The patient is planning to be discharged  home with home health services

## 2019-02-09 NOTE — Progress Notes (Signed)
[place] [id number], [place] [address] Phone: [phone] Fax: [phone]      Your procedure is scheduled on Wednesday, January 27th.  Report to [facility] Main Entrance "A" at 5:30 A.M., and check in at the Admitting office.  Call this number if you have problems the morning of surgery:  [phone]  Call [phone] if you have any questions prior to your surgery date Monday-Friday 8am-4pm    Remember:  Do not eat after midnight the night before your surgery  You may drink clear liquids until 4:30 AM the morning of your surgery.   Clear liquids allowed are: Water, Non-Citrus Juices (without pulp), Carbonated Beverages, Clear Tea, Black Coffee Only, and Gatorade   Enhanced Recovery after Surgery for Orthopedics Enhanced Recovery after Surgery is a protocol used to improve the stress on your body and your recovery after surgery.  Patient Instructions  . The night before surgery:  o No food after midnight. ONLY clear liquids after midnight  .  Marland Kitchen The day of surgery (if you do NOT have diabetes):  o Drink ONE (1) Pre-Surgery Clear Ensure as directed.   o This drink was given to you during your hospital  pre-op appointment visit. FINISH ENSURE BY 4:30 AM ON THE DAY OF SURGERY o The pre-op nurse will instruct you on the time to drink the  Pre-Surgery Ensure depending on your surgery time. o Finish the drink at the designated time by the pre-op nurse.  o Nothing else to drink after completing the  Pre-Surgery Clear Ensure.          If you have questions, please contact your surgeon's office.     Take these medicines the morning of surgery with A SIP OF WATER   Atorvastatin (Lipitor)  Flonase nasal spray - if needed  Metoprolol  Oxycodone    7 days prior to surgery STOP taking any Aspirin (unless otherwise instructed by your surgeon), Aleve, Naproxen,  Ibuprofen, Motrin, Advil, Goody's, BC's, all herbal medications, fish oil, and all vitamins.    The Morning of Surgery  Do not wear jewelry.  Do not wear lotions, powders, colognes, or deodorant  Men may shave face and neck.  Do not bring valuables to the hospital.  [facility] is not responsible for any belongings or valuables.  If you are a smoker, DO NOT Smoke 24 hours prior to surgery  If you wear a CPAP at night please bring your mask the morning of surgery   Remember that you must have someone to transport you home after your surgery, and remain with you for 24 hours if you are discharged the same day.   Please bring cases for contacts, glasses, hearing aids, dentures or bridgework because it cannot be worn into surgery.    Leave your suitcase in the car.  After surgery it may be brought to your room.  For patients admitted to the hospital, discharge time will be determined by your treatment team.  Patients discharged the day of surgery will not be allowed to drive home.    Special instructions:   [facility]- Preparing For Surgery  Before surgery, you can play an important role. Because skin is not sterile, your skin needs to be as free of germs as possible. You can reduce the number of germs on your skin by washing with CHG (chlorahexidine gluconate) Soap before  surgery.  CHG is an antiseptic cleaner which kills germs and bonds with the skin to continue killing germs even after washing.    Oral Hygiene is also important to reduce your risk of infection.  Remember - BRUSH YOUR TEETH THE MORNING OF SURGERY WITH YOUR REGULAR TOOTHPASTE  Please do not use if you have an allergy to CHG or antibacterial soaps. If your skin becomes reddened/irritated stop using the CHG.  Do not shave (including legs and underarms) for at least 48 hours prior to first CHG shower. It is OK to shave your face.  Please follow these instructions carefully.   1. Shower the NIGHT BEFORE SURGERY and  the MORNING OF SURGERY with CHG Soap.   2. If you chose to wash your hair, wash your hair first as usual with your normal shampoo.  3. After you shampoo, rinse your hair and body thoroughly to remove the shampoo.  4. Use CHG as you would any other liquid soap. You can apply CHG directly to the skin and wash gently with a scrungie or a clean washcloth.   5. Apply the CHG Soap to your body ONLY FROM THE NECK DOWN.  Do not use on open wounds or open sores. Avoid contact with your eyes, ears, mouth and genitals (private parts). Wash Face and genitals (private parts)  with your normal soap.   6. Wash thoroughly, paying special attention to the area where your surgery will be performed.  7. Thoroughly rinse your body with warm water from the neck down.  8. DO NOT shower/wash with your normal soap after using and rinsing off the CHG Soap.  9. Pat yourself dry with a CLEAN TOWEL.  10. Wear CLEAN PAJAMAS to bed the night before surgery, wear comfortable clothes the morning of surgery  11. Place CLEAN SHEETS on your bed the night of your first shower and DO NOT SLEEP WITH PETS.    Day of Surgery:  Please shower the morning of surgery with the CHG soap Do not apply any deodorants/lotions. Please wear clean clothes to the hospital/surgery center.   Remember to brush your teeth WITH YOUR REGULAR TOOTHPASTE.   Please read over the following fact sheets that you were given.

## 2019-02-10 ENCOUNTER — Encounter (HOSPITAL_COMMUNITY)
Admission: RE | Admit: 2019-02-10 | Discharge: 2019-02-10 | Disposition: A | Discharge status: Home / Self Care | Payer: PPO | Source: Ambulatory Visit | Attending: Orthopedic Surgery | Admitting: Orthopedic Surgery

## 2019-02-10 ENCOUNTER — Encounter (HOSPITAL_COMMUNITY): Payer: Self-pay

## 2019-02-10 ENCOUNTER — Other Ambulatory Visit: Payer: Self-pay

## 2019-02-10 DIAGNOSIS — Z96611 Presence of right artificial shoulder joint: Secondary | ICD-10-CM | POA: Insufficient documentation

## 2019-02-10 DIAGNOSIS — I081 Rheumatic disorders of both mitral and tricuspid valves: Secondary | ICD-10-CM | POA: Diagnosis not present

## 2019-02-10 DIAGNOSIS — K219 Gastro-esophageal reflux disease without esophagitis: Secondary | ICD-10-CM | POA: Diagnosis not present

## 2019-02-10 DIAGNOSIS — I10 Essential (primary) hypertension: Secondary | ICD-10-CM | POA: Insufficient documentation

## 2019-02-10 DIAGNOSIS — M4322 Fusion of spine, cervical region: Secondary | ICD-10-CM | POA: Insufficient documentation

## 2019-02-10 DIAGNOSIS — M4326 Fusion of spine, lumbar region: Secondary | ICD-10-CM | POA: Insufficient documentation

## 2019-02-10 DIAGNOSIS — Z96612 Presence of left artificial shoulder joint: Secondary | ICD-10-CM | POA: Diagnosis not present

## 2019-02-10 DIAGNOSIS — T84093A Other mechanical complication of internal left knee prosthesis, initial encounter: Secondary | ICD-10-CM | POA: Diagnosis not present

## 2019-02-10 DIAGNOSIS — E669 Obesity, unspecified: Secondary | ICD-10-CM | POA: Insufficient documentation

## 2019-02-10 DIAGNOSIS — E785 Hyperlipidemia, unspecified: Secondary | ICD-10-CM | POA: Diagnosis not present

## 2019-02-10 DIAGNOSIS — Z79899 Other long term (current) drug therapy: Secondary | ICD-10-CM | POA: Insufficient documentation

## 2019-02-10 DIAGNOSIS — M1990 Unspecified osteoarthritis, unspecified site: Secondary | ICD-10-CM | POA: Diagnosis not present

## 2019-02-10 DIAGNOSIS — Z8579 Personal history of other malignant neoplasms of lymphoid, hematopoietic and related tissues: Secondary | ICD-10-CM | POA: Diagnosis not present

## 2019-02-10 DIAGNOSIS — Z96651 Presence of right artificial knee joint: Secondary | ICD-10-CM | POA: Insufficient documentation

## 2019-02-10 DIAGNOSIS — Z6833 Body mass index (BMI) 33.0-33.9, adult: Secondary | ICD-10-CM | POA: Diagnosis not present

## 2019-02-10 DIAGNOSIS — Z01812 Encounter for preprocedural laboratory examination: Secondary | ICD-10-CM | POA: Insufficient documentation

## 2019-02-10 DIAGNOSIS — Z87891 Personal history of nicotine dependence: Secondary | ICD-10-CM | POA: Diagnosis not present

## 2019-02-10 DIAGNOSIS — I2510 Atherosclerotic heart disease of native coronary artery without angina pectoris: Secondary | ICD-10-CM | POA: Diagnosis not present

## 2019-02-10 DIAGNOSIS — M19012 Primary osteoarthritis, left shoulder: Secondary | ICD-10-CM | POA: Diagnosis not present

## 2019-02-10 DIAGNOSIS — M25612 Stiffness of left shoulder, not elsewhere classified: Secondary | ICD-10-CM | POA: Diagnosis not present

## 2019-02-10 DIAGNOSIS — M6281 Muscle weakness (generalized): Secondary | ICD-10-CM | POA: Diagnosis not present

## 2019-02-10 DIAGNOSIS — Z471 Aftercare following joint replacement surgery: Secondary | ICD-10-CM | POA: Diagnosis not present

## 2019-02-10 HISTORY — DX: Personal history of other diseases of the digestive system: Z87.19

## 2019-02-10 LAB — CBC WITH DIFFERENTIAL/PLATELET
Abs Immature Granulocytes: 0.03 10*3/uL (ref 0.00–0.07)
Basophils Absolute: 0 10*3/uL (ref 0.0–0.1)
Basophils Relative: 0 %
Eosinophils Absolute: 0 10*3/uL (ref 0.0–0.5)
Eosinophils Relative: 0 %
HCT: 35.6 % — ABNORMAL LOW (ref 39.0–52.0)
Hemoglobin: 11.1 g/dL — ABNORMAL LOW (ref 13.0–17.0)
Immature Granulocytes: 0 %
Lymphocytes Relative: 27 %
Lymphs Abs: 1.9 10*3/uL (ref 0.7–4.0)
MCH: 33.2 pg (ref 26.0–34.0)
MCHC: 31.2 g/dL (ref 30.0–36.0)
MCV: 106.6 fL — ABNORMAL HIGH (ref 80.0–100.0)
Monocytes Absolute: 0.7 10*3/uL (ref 0.1–1.0)
Monocytes Relative: 11 %
Neutro Abs: 4.3 10*3/uL (ref 1.7–7.7)
Neutrophils Relative %: 62 %
Platelets: 165 10*3/uL (ref 150–400)
RBC: 3.34 MIL/uL — ABNORMAL LOW (ref 4.22–5.81)
RDW: 14.9 % (ref 11.5–15.5)
WBC: 7 10*3/uL (ref 4.0–10.5)
nRBC: 0 % (ref 0.0–0.2)

## 2019-02-10 LAB — COMPREHENSIVE METABOLIC PANEL
ALT: 21 U/L (ref 0–44)
AST: 19 U/L (ref 15–41)
Albumin: 3.5 g/dL (ref 3.5–5.0)
Alkaline Phosphatase: 40 U/L (ref 38–126)
Anion gap: 10 (ref 5–15)
BUN: 16 mg/dL (ref 8–23)
CO2: 24 mmol/L (ref 22–32)
Calcium: 9.2 mg/dL (ref 8.9–10.3)
Chloride: 106 mmol/L (ref 98–111)
Creatinine, Ser: 0.9 mg/dL (ref 0.61–1.24)
GFR calc Af Amer: >60 mL/min (ref >60)
GFR calc non Af Amer: >60 mL/min (ref >60)
Glucose, Bld: 107 mg/dL — ABNORMAL HIGH (ref 70–99)
Potassium: 3.9 mmol/L (ref 3.5–5.1)
Sodium: 140 mmol/L (ref 135–145)
Total Bilirubin: 0.4 mg/dL (ref 0.3–1.2)
Total Protein: 6.8 g/dL (ref 6.5–8.1)

## 2019-02-10 LAB — APTT: aPTT: 24 seconds (ref 24–36)

## 2019-02-10 LAB — PROTIME-INR
INR: 1 (ref 0.8–1.2)
Prothrombin Time: 12.9 seconds (ref 11.4–15.2)

## 2019-02-10 LAB — SURGICAL PCR SCREEN
MRSA, PCR: NEGATIVE
Staphylococcus aureus: POSITIVE — AB

## 2019-02-10 NOTE — Progress Notes (Signed)
PCP - Dr. [name] Cardiologist - Dr. Lake Bells T. O'Neal (currently) Dr. Constance Haw (former, patient recently switched providers)  PPM/ICD - denies  Chest x-ray - N/A EKG - 01/22/2019 Stress Test - 11/19/15 ECHO - 12/13/2015 Cardiac Cath - denies  Sleep Study - denies CPAP - N/A  Blood Thinner Instructions: N/A Aspirin Instructions: N/A  ERAS Protcol - Yes PRE-SURGERY Ensure or G2- Ensure given  COVID TEST- Scheduled for Monday 02/16/2019. Patient verbalized understanding of self-quarantine instructions, appointment time and place.  Anesthesia review: YES, surgical clearance, hx of CAD  Patient denies shortness of breath, fever, cough and chest pain at PAT appointment  All instructions explained to the patient, with a verbal understanding of the material. Patient agrees to go over the instructions while at home for a better understanding. Patient also instructed to self quarantine after being tested for COVID-19. The opportunity to ask questions was provided.

## 2019-02-10 NOTE — Progress Notes (Signed)
[place] [id number], [place] [address] Phone: [phone] Fax: [phone]    Your procedure is scheduled on Wednesday, January 27th.  Report to [facility] Main Entrance "A" at 5:30 A.M., and check in at the Admitting office.  Call this number if you have problems the morning of surgery:  [phone]  Call [phone] if you have any questions prior to your surgery date Monday-Friday 8am-4pm   Remember:  Do not eat after midnight the night before your surgery  You may drink clear liquids until 4:30 AM the morning of your surgery.   Clear liquids allowed are: Water, Non-Citrus Juices (without pulp), Carbonated Beverages, Clear Tea, Black Coffee Only, and Gatorade  Enhanced Recovery after Surgery for Orthopedics Enhanced Recovery after Surgery is a protocol used to improve the stress on your body and your recovery after surgery.  Patient Instructions  . The night before surgery:  o No food after midnight. ONLY clear liquids after midnight  .  Marland Kitchen The day of surgery (if you do NOT have diabetes):  o Drink ONE (1) Pre-Surgery Clear Ensure as directed.   o This drink was given to you during your hospital  pre-op appointment visit. FINISH ENSURE BY 4:30 AM ON THE DAY OF SURGERY o The pre-op nurse will instruct you on the time to drink the  Pre-Surgery Ensure depending on your surgery time. o Nothing else to drink after completing the  Pre-Surgery Clear Ensure.         If you have questions, please contact your surgeon's office.    Take these medicines the morning of surgery with A SIP OF WATER   metoprolol tartrate (LOPRESSOR)   tamsulosin (FLOMAX)    If needed - methocarbamol (ROBAXIN), oxyCODONE (OXY IR/ROXICODONE)   7 days prior to surgery STOP taking any Aspirin (unless otherwise instructed by your surgeon), Aleve, Naproxen, Ibuprofen, Motrin, Advil, Goody's,  BC's, all herbal medications, fish oil, and all vitamins.   The Morning of Surgery  Do not wear jewelry.  Do not wear lotions, powders, colognes, or deodorant  Men may shave face and neck.  Do not bring valuables to the hospital.  [facility] is not responsible for any belongings or valuables.  If you are a smoker, DO NOT Smoke 24 hours prior to surgery  If you wear a CPAP at night please bring your mask the morning of surgery   Remember that you must have someone to transport you home after your surgery, and remain with you for 24 hours if you are discharged the same day.  Please bring cases for contacts, glasses, hearing aids, dentures or bridgework because it cannot be worn into surgery.   Leave your suitcase in the car.  After surgery it may be brought to your room.  For patients admitted to the hospital, discharge time will be determined by your treatment team.  Patients discharged the day of surgery will not be allowed to drive home.   Special instructions:   [facility]- Preparing For Surgery  Before surgery, you can play an important role. Because skin is not sterile, your skin needs to be as free of germs as possible. You can reduce the number of germs on your skin by washing with CHG (chlorahexidine gluconate) Soap before surgery.  CHG is an antiseptic cleaner which kills germs and bonds with the skin to continue killing germs even  after washing.    Oral Hygiene is also important to reduce your risk of infection.  Remember - BRUSH YOUR TEETH THE MORNING OF SURGERY WITH YOUR REGULAR TOOTHPASTE  Please do not use if you have an allergy to CHG or antibacterial soaps. If your skin becomes reddened/irritated stop using the CHG.  Do not shave (including legs and underarms) for at least 48 hours prior to first CHG shower. It is OK to shave your face.  Please follow these instructions carefully.   1. Shower the NIGHT BEFORE SURGERY and the MORNING OF SURGERY with CHG Soap.    2. If you chose to wash your hair, wash your hair first as usual with your normal shampoo.  3. After you shampoo, rinse your hair and body thoroughly to remove the shampoo.  4. Use CHG as you would any other liquid soap. You can apply CHG directly to the skin and wash gently with a scrungie or a clean washcloth.   5. Apply the CHG Soap to your body ONLY FROM THE NECK DOWN.  Do not use on open wounds or open sores. Avoid contact with your eyes, ears, mouth and genitals (private parts). Wash Face and genitals (private parts)  with your normal soap.   6. Wash thoroughly, paying special attention to the area where your surgery will be performed.  7. Thoroughly rinse your body with warm water from the neck down.  8. DO NOT shower/wash with your normal soap after using and rinsing off the CHG Soap.  9. Pat yourself dry with a CLEAN TOWEL.  10. Wear CLEAN PAJAMAS to bed the night before surgery, wear comfortable clothes the morning of surgery  11. Place CLEAN SHEETS on your bed the night of your first shower and DO NOT SLEEP WITH PETS.   Day of Surgery: Please shower the morning of surgery with the CHG soap Do not apply any deodorants/lotions. Please wear clean clothes to the hospital/surgery center.   Remember to brush your teeth WITH YOUR REGULAR TOOTHPASTE.  Please read over the following fact sheets that you were given.

## 2019-02-11 NOTE — Progress Notes (Addendum)
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 BMI is consistent with obesity.   Last evaluation by oncologist Dr. Marin Olp on 01/28/19. He received daratumumab (Darzalex) which he had been receiving every 6 weeks. He is also on denosumab Delton See) every 3 months. He wrote, "From my point of view, I do not see any problems with him having the surgery [right knee surgery].  His blood counts are okay.  I do not think there will be an issue with respect to healing."  Preoperative cardiology evaluation by Dr. Audie Box on 01/22/19. Per note: "Preoperative Risk Assessment - The Revised Cardiac Risk Index = 0,  which equates to 0.4% estimated risk of perioperative myocardial infarction, pulmonary edema, ventricular fibrillation, cardiac arrest, or complete heart block.  - No further cardiac testing is recommended prior to surgery.  - The patient may proceed to surgery at acceptable risk.    - Our service is available as needed in the peri-operative period.  -He can complete greater than four METS without any issues."   Last evaluation by PCP Dr. Larose Kells on 01/20/19. He wrote, "Cleared from my standpoint, needs   clearance from his cardiologist".   Preoperative COVID-19 test is scheduled for 02/16/19. Anesthesia team to evaluate on the day of surgery.    VS: BP (!) 157/82   Pulse 69   Temp 36.8 C   Resp 18   Ht 5' 6"  (1.676 m)   Wt 95.3 kg   SpO2 100%   BMI 33.91 kg/m     PROVIDERS: [place], MD is PCP Burney Gauze, MD is HEM-ONC Eleonore Chiquito, MD is cardiologist   LABS: Labs reviewed: Acceptable for surgery. (all labs ordered are listed, but only abnormal results are displayed)  Labs Reviewed  SURGICAL PCR SCREEN - Abnormal; Notable for the following components:      Result Value   Staphylococcus aureus POSITIVE (*)    All other components within normal limits  CBC WITH DIFFERENTIAL/PLATELET - Abnormal; Notable for the following components:   RBC 3.34 (*)    Hemoglobin 11.1 (*)    HCT 35.6 (*)    MCV 106.6 (*)    All other components within normal limits  COMPREHENSIVE METABOLIC PANEL - Abnormal; Notable for the following components:   Glucose, Bld 107 (*)    All other components within normal limits  APTT  PROTIME-INR  TYPE AND SCREEN   Results for Gene Hill, Gene Hill (MRN [id number]) as of 02/11/2019 13:51  Ref. Range 01/28/2019 08:40 01/28/2019 08:40  Glucose Latest Ref Range: 70 - 99 mg/dL 101 (H)   IgG (Immunoglobin G), Serum Latest Ref Range: 603 - 1,613 mg/dL 109 (L) 143 (L)  IgM (Immunoglobulin M), Srm Latest Ref Range: 15 - 143 mg/dL <5 (  L) <5 (L)  IgA Latest Ref Range: 61 - 437 mg/dL 1,069 (H) 1,279 (H)  Kappa free light chain Latest Ref Range: 3.3 - 19.4 mg/L 88.1 (H)   Lamda free light chains Latest Ref Range: 5.7 - 26.3 mg/L 2.5 (L)   Kappa, lamda light chain ratio Latest Ref Range: 0.26 - 1.65  35.24 (H)      EKG: 01/22/2019 Normal sinus rhythm Normal ECG   CV: Echo 12/13/2015 Study Conclusions - Left ventricle: The cavity size was normal. There was mild concentric hypertrophy. Systolic function was normal. The estimated ejection fraction was  in the range of 60% to 65%. Wall motion was normal; there were no regional wall motion abnormalities. Doppler parameters are consistent with abnormal left ventricular relaxation (grade 1 diastolic dysfunction). Doppler parameters are consistent with indeterminate ventricular filling pressure. - Aortic valve: Transvalvular velocity was within the normal range. There was no stenosis. There was no regurgitation. - Mitral valve: Calcified annulus. Transvalvular velocity was within the normal range. There was no evidence for stenosis. There was mild regurgitation. - Right ventricle: The cavity size was normal. Wall thickness was normal. Systolic function was normal. - Atrial septum: A patent foramen ovale cannot be excluded. - Tricuspid valve: There was mild regurgitation. - Pulmonary arteries: Systolic pressure was within the normal range. PA peak pressure: 28 mm Hg (S).   Stress Test 11/19/2015 IMPRESSION: 1. No reversible ischemia or infarction. 2. Normal left ventricular wall motion. 3. Left ventricular ejection fraction 67% 4. Non invasive risk stratification*: Low   Cardiology notes from 10/2015 indicate history of non-obstructive CAD ~ 1998 by Dr. Chase Picket, although report not currently available.   Past Medical History:  Diagnosis Date  . Arthritis   . 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 Blood dyscrasia    low WBC d/t multiple myeloma  . Bulging lumbar disc    L2-3  . Carotid bruit    3-11 carotid u/s was  (-)  . 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 GERD (gastroesophageal reflux disease)   . Headache   . Heart murmur    doesn't have one  . History of hiatal hernia   . HYPERLIPIDEMIA 05/03/2006  . HYPERTENSION 05/03/2006  . 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 PEPTIC ULCER DISEASE 07/22/2008   per EGD 6-10 .Marland Kitchen ulcer  duodenitis   . Torn rotator cuff 01/22/2017   left- [place]    Past Surgical History:  Procedure Laterality Date  . BACK SURGERY    . CERVICAL FUSION    . IR FLUORO GUIDE PORT INSERTION RIGHT  11/29/2016  . IR US GUIDE VASC ACCESS RIGHT  11/29/2016  . [place]   right  . REVERSE SHOULDER ARTHROPLASTY Right 06/25/2018   Procedure: REVERSE SHOULDER ARTHROPLASTY;  Surgeon: Hiram Gash, MD;  Location: [facility] ORS;  Service: Orthopedics;  Laterality: Right;  . REVERSE SHOULDER ARTHROPLASTY Left 11/19/2018   Procedure: REVERSE SHOULDER ARTHROPLASTY;  Surgeon: Hiram Gash, MD;  Location: [facility] ORS;  Service: Orthopedics;  Laterality: Left;  . ROTATOR CUFF REPAIR     right  . SPINAL FUSION     lumbar  . TOTAL KNEE ARTHROPLASTY Right 12/23/2015   Procedure: TOTAL KNEE ARTHROPLASTY;  Surgeon: Earlie Server, MD;  Location: [facility];  Service: Orthopedics;  Laterality: Right;  MEDICATIONS: . acetaminophen (TYLENOL) 500 MG tablet  . atorvastatin (LIPITOR) 10 MG tablet  . b complex vitamins tablet  . calcium carbonate (OSCAL) 1500 (600 Ca) MG TABS tablet  . Cyanocobalamin (VITAMIN B-12 PO)  . Daratumumab (DARZALEX IV)  . dexamethasone (DECADRON) 2 MG tablet  . fluticasone (FLONASE) 50 MCG/ACT nasal spray  . methocarbamol (ROBAXIN) 500 MG tablet  . metoprolol tartrate (LOPRESSOR) 100 MG tablet  . Multiple Vitamin (MULTIVITAMIN) tablet  . Omega-3 Fatty Acids (FISH OIL) 1200 MG CAPS  . oxyCODONE (OXY IR/ROXICODONE) 5 MG immediate release tablet  . pantoprazole (PROTONIX) 40 MG tablet  . Probiotic Product (PROBIOTIC PO)  . tamsulosin (FLOMAX) 0.4 MG CAPS capsule   No current facility-administered medications for this encounter.   . daratumumab (DARZALEX) 1,600 mg in sodium chloride 0.9 % 420 mL (3.2 mg/mL) chemo infusion  . sodium chloride flush (NS) 0.9 % injection 10 mL  . sodium chloride flush (NS) 0.9 % injection 10 mL  Decadron is taken for 4 days after  chemotherapy.    Myra Gianotti, PA-C Surgical Short Stay/Anesthesiology [facility] Phone [phone] [facility] Phone [phone] 02/11/2019 2:47 PM

## 2019-02-11 NOTE — Anesthesia Preprocedure Evaluation (Addendum)
Anesthesia Evaluation  Patient identified by MRN, date of birth, ID band Patient awake    Reviewed: Allergy & Precautions, NPO status , Patient's Chart, lab work & pertinent test results, reviewed documented beta blocker date and time   History of Anesthesia Complications Negative for: history of anesthetic complications  Airway Mallampati: III  TM Distance: >3 FB Neck ROM: Full    Dental  (+) Dental Advisory Given   Pulmonary former smoker,    breath sounds clear to auscultation       Cardiovascular hypertension, Pt. on medications and Pt. 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 BMI is consistent with obesity.   Last evaluation by oncologist Dr. Marin Olp on 01/28/19. He received daratumumab (Darzalex) which he had been receiving every 6 weeks. He is also on denosumab Delton See) every 3 months. He wrote, "From my point of view, I do not see any problems with him having the surgery (right knee surgery). His blood counts are okay. I do not think there will be an issue with respect to healing."  Preoperative cardiology evaluation by Dr. Audie Box on  01/22/19. Per note: "Preoperative Risk Assessment - The Revised Cardiac Risk Index =0,which equates to 0.4%estimated risk of perioperative myocardial infarction, pulmonary edema, ventricular fibrillation, cardiac arrest, or complete heart block.  - No further cardiac testing is recommended prior to surgery.  - The patient may proceed to surgery at acceptable risk.  - Our service is available as needed in the peri-operative period. -He can complete greater than four METS without any issues."  Last evaluation by PCP Dr. Larose Kells on 01/20/19. He wrote, "Cleared from my standpoint, needs clearance from his cardiologist".    Left ventricle: The cavity size was normal. There was mild   concentric hypertrophy. Systolic function was normal. The   estimated ejection fraction was in the range of 60% to 65%. Wall   motion was normal; there were no regional wall motion   abnormalities. Doppler parameters are consistent with abnormal   left ventricular relaxation (grade 1 diastolic dysfunction).   Doppler parameters are consistent with indeterminate ventricular   filling pressure. - Aortic valve: Transvalvular velocity was within the normal range.   There was no stenosis. There was no regurgitation. - Mitral valve: Calcified annulus. Transvalvular velocity was   within the normal range. There was no evidence for stenosis.   There was mild regurgitation. - Right ventricle: The cavity size was normal. Wall thickness was   normal. Systolic function was normal. - Atrial septum: A patent foramen ovale cannot be excluded. - Tricuspid valve: There was mild regurgitation. - Pulmonary arteries: Systolic pressure was within the normal   range. PA peak pressure: 28 mm Hg (S).  Reproductive/Obstetrics  Anesthesia Physical Anesthesia Plan  ASA: III  Anesthesia Plan: MAC, Spinal and Regional   Post-op Pain Management:  Regional for Post-op pain    Induction:   PONV Risk Score and Plan: 1 and Treatment may vary due to age or medical condition and Propofol infusion  Airway Management Planned: Nasal Cannula  Additional Equipment: None  Intra-op Plan:   Post-operative Plan:   Informed Consent: I have reviewed the patients History and Physical, chart, labs and discussed the procedure including the risks, benefits and alternatives for the proposed anesthesia with the patient or authorized representative who has indicated his/her understanding and acceptance.     Dental advisory given  Plan Discussed with: CRNA and Surgeon  Anesthesia Plan Comments: (PAT note written 02/11/2019 by Myra Gianotti, PA-C. Has priority 1 approval.  )       Anesthesia Quick Evaluation

## 2019-02-12 DIAGNOSIS — R3914 Feeling of incomplete bladder emptying: Secondary | ICD-10-CM | POA: Diagnosis not present

## 2019-02-12 DIAGNOSIS — N130 Hydronephrosis with ureteropelvic junction obstruction: Secondary | ICD-10-CM | POA: Diagnosis not present

## 2019-02-12 DIAGNOSIS — M19012 Primary osteoarthritis, left shoulder: Secondary | ICD-10-CM | POA: Diagnosis not present

## 2019-02-12 DIAGNOSIS — M6281 Muscle weakness (generalized): Secondary | ICD-10-CM | POA: Diagnosis not present

## 2019-02-12 DIAGNOSIS — M25612 Stiffness of left shoulder, not elsewhere classified: Secondary | ICD-10-CM | POA: Diagnosis not present

## 2019-02-12 DIAGNOSIS — M25512 Pain in left shoulder: Secondary | ICD-10-CM | POA: Diagnosis not present

## 2019-02-12 DIAGNOSIS — R350 Frequency of micturition: Secondary | ICD-10-CM | POA: Diagnosis not present

## 2019-02-13 DIAGNOSIS — M19012 Primary osteoarthritis, left shoulder: Secondary | ICD-10-CM | POA: Diagnosis not present

## 2019-02-16 ENCOUNTER — Other Ambulatory Visit (HOSPITAL_COMMUNITY)
Admission: RE | Admit: 2019-02-16 | Discharge: 2019-02-16 | Disposition: A | Discharge status: Home / Self Care | Payer: PPO | Source: Ambulatory Visit | Attending: Orthopedic Surgery | Admitting: Orthopedic Surgery

## 2019-02-16 DIAGNOSIS — C9000 Multiple myeloma not having achieved remission: Secondary | ICD-10-CM | POA: Diagnosis present

## 2019-02-16 DIAGNOSIS — Z87891 Personal history of nicotine dependence: Secondary | ICD-10-CM | POA: Diagnosis not present

## 2019-02-16 DIAGNOSIS — Z79899 Other long term (current) drug therapy: Secondary | ICD-10-CM | POA: Diagnosis not present

## 2019-02-16 DIAGNOSIS — K219 Gastro-esophageal reflux disease without esophagitis: Secondary | ICD-10-CM | POA: Diagnosis present

## 2019-02-16 DIAGNOSIS — Y792 Prosthetic and other implants, materials and accessory orthopedic devices associated with adverse incidents: Secondary | ICD-10-CM | POA: Diagnosis present

## 2019-02-16 DIAGNOSIS — H9190 Unspecified hearing loss, unspecified ear: Secondary | ICD-10-CM | POA: Diagnosis present

## 2019-02-16 DIAGNOSIS — T8489XA Other specified complication of internal orthopedic prosthetic devices, implants and grafts, initial encounter: Secondary | ICD-10-CM | POA: Diagnosis not present

## 2019-02-16 DIAGNOSIS — Z20822 Contact with and (suspected) exposure to covid-19: Secondary | ICD-10-CM | POA: Diagnosis present

## 2019-02-16 DIAGNOSIS — T84032A Mechanical loosening of internal right knee prosthetic joint, initial encounter: Secondary | ICD-10-CM | POA: Diagnosis present

## 2019-02-16 DIAGNOSIS — Z8711 Personal history of peptic ulcer disease: Secondary | ICD-10-CM | POA: Diagnosis not present

## 2019-02-16 DIAGNOSIS — Z96611 Presence of right artificial shoulder joint: Secondary | ICD-10-CM | POA: Diagnosis present

## 2019-02-16 DIAGNOSIS — Z96651 Presence of right artificial knee joint: Secondary | ICD-10-CM | POA: Diagnosis present

## 2019-02-16 DIAGNOSIS — I10 Essential (primary) hypertension: Secondary | ICD-10-CM | POA: Diagnosis present

## 2019-02-16 DIAGNOSIS — Z981 Arthrodesis status: Secondary | ICD-10-CM | POA: Diagnosis not present

## 2019-02-16 DIAGNOSIS — I2510 Atherosclerotic heart disease of native coronary artery without angina pectoris: Secondary | ICD-10-CM | POA: Diagnosis present

## 2019-02-16 DIAGNOSIS — Y838 Other surgical procedures as the cause of abnormal reaction of the patient, or of later complication, without mention of misadventure at the time of the procedure: Secondary | ICD-10-CM | POA: Diagnosis present

## 2019-02-16 DIAGNOSIS — M25561 Pain in right knee: Secondary | ICD-10-CM | POA: Diagnosis present

## 2019-02-16 DIAGNOSIS — E785 Hyperlipidemia, unspecified: Secondary | ICD-10-CM | POA: Diagnosis present

## 2019-02-16 DIAGNOSIS — M1711 Unilateral primary osteoarthritis, right knee: Secondary | ICD-10-CM | POA: Diagnosis present

## 2019-02-16 DIAGNOSIS — Z8249 Family history of ischemic heart disease and other diseases of the circulatory system: Secondary | ICD-10-CM | POA: Diagnosis not present

## 2019-02-16 DIAGNOSIS — Z96612 Presence of left artificial shoulder joint: Secondary | ICD-10-CM | POA: Diagnosis present

## 2019-02-16 LAB — SARS CORONAVIRUS 2 (TAT 6-24 HRS): SARS Coronavirus 2: NEGATIVE

## 2019-02-17 ENCOUNTER — Ambulatory Visit: Payer: Self-pay | Admitting: Physician Assistant

## 2019-02-17 MED ORDER — TRANEXAMIC ACID-NACL 1000-0.7 MG/100ML-% IV SOLN
1000.0000 mg | INTRAVENOUS | Status: AC
  Administered 2019-02-18: 1000 mg via INTRAVENOUS
  Filled 2019-02-17: qty 100

## 2019-02-17 MED ORDER — VANCOMYCIN HCL 10 G IV SOLR
1000.0000 mg | INTRAVENOUS | Status: AC
  Administered 2019-02-18: 1000 mg via INTRAVENOUS

## 2019-02-17 MED ORDER — TRANEXAMIC ACID 1000 MG/10ML IV SOLN
2000.0000 mg | INTRAVENOUS | Status: DC
  Filled 2019-02-17: qty 20

## 2019-02-17 MED ORDER — BUPIVACAINE LIPOSOME 1.3 % IJ SUSP
20.0000 mL | INTRAMUSCULAR | Status: DC
  Filled 2019-02-17: qty 20

## 2019-02-18 ENCOUNTER — Encounter (HOSPITAL_COMMUNITY): Payer: Self-pay | Admitting: Orthopedic Surgery

## 2019-02-18 ENCOUNTER — Inpatient Hospital Stay (HOSPITAL_COMMUNITY): Payer: PPO | Admitting: Vascular Surgery

## 2019-02-18 ENCOUNTER — Encounter (HOSPITAL_COMMUNITY)
Admission: RE | Disposition: A | Discharge status: Home / Self Care | Payer: Self-pay | Source: Home / Self Care | Attending: Orthopedic Surgery

## 2019-02-18 ENCOUNTER — Inpatient Hospital Stay (HOSPITAL_COMMUNITY): Payer: PPO | Admitting: Certified Registered"

## 2019-02-18 ENCOUNTER — Other Ambulatory Visit: Payer: Self-pay

## 2019-02-18 ENCOUNTER — Inpatient Hospital Stay (HOSPITAL_COMMUNITY)
Admission: RE | Admit: 2019-02-18 | Discharge: 2019-02-19 | DRG: 464 | Disposition: A | Discharge status: Home / Self Care | Payer: PPO | Attending: Orthopedic Surgery | Admitting: Orthopedic Surgery

## 2019-02-18 DIAGNOSIS — Z8711 Personal history of peptic ulcer disease: Secondary | ICD-10-CM

## 2019-02-18 DIAGNOSIS — Z87891 Personal history of nicotine dependence: Secondary | ICD-10-CM

## 2019-02-18 DIAGNOSIS — Z96651 Presence of right artificial knee joint: Secondary | ICD-10-CM | POA: Diagnosis present

## 2019-02-18 DIAGNOSIS — Z96611 Presence of right artificial shoulder joint: Secondary | ICD-10-CM | POA: Diagnosis present

## 2019-02-18 DIAGNOSIS — E785 Hyperlipidemia, unspecified: Secondary | ICD-10-CM | POA: Diagnosis present

## 2019-02-18 DIAGNOSIS — Z96612 Presence of left artificial shoulder joint: Secondary | ICD-10-CM | POA: Diagnosis present

## 2019-02-18 DIAGNOSIS — Z79899 Other long term (current) drug therapy: Secondary | ICD-10-CM | POA: Diagnosis not present

## 2019-02-18 DIAGNOSIS — I10 Essential (primary) hypertension: Secondary | ICD-10-CM | POA: Diagnosis present

## 2019-02-18 DIAGNOSIS — Z981 Arthrodesis status: Secondary | ICD-10-CM | POA: Diagnosis not present

## 2019-02-18 DIAGNOSIS — K219 Gastro-esophageal reflux disease without esophagitis: Secondary | ICD-10-CM | POA: Diagnosis present

## 2019-02-18 DIAGNOSIS — H9190 Unspecified hearing loss, unspecified ear: Secondary | ICD-10-CM | POA: Diagnosis present

## 2019-02-18 DIAGNOSIS — C9000 Multiple myeloma not having achieved remission: Secondary | ICD-10-CM | POA: Diagnosis present

## 2019-02-18 DIAGNOSIS — M25561 Pain in right knee: Secondary | ICD-10-CM | POA: Diagnosis present

## 2019-02-18 DIAGNOSIS — Y792 Prosthetic and other implants, materials and accessory orthopedic devices associated with adverse incidents: Secondary | ICD-10-CM | POA: Diagnosis present

## 2019-02-18 DIAGNOSIS — Z20822 Contact with and (suspected) exposure to covid-19: Secondary | ICD-10-CM | POA: Diagnosis present

## 2019-02-18 DIAGNOSIS — T84012A Broken internal right knee prosthesis, initial encounter: Secondary | ICD-10-CM

## 2019-02-18 DIAGNOSIS — I2510 Atherosclerotic heart disease of native coronary artery without angina pectoris: Secondary | ICD-10-CM | POA: Diagnosis present

## 2019-02-18 DIAGNOSIS — Z8249 Family history of ischemic heart disease and other diseases of the circulatory system: Secondary | ICD-10-CM

## 2019-02-18 DIAGNOSIS — Y838 Other surgical procedures as the cause of abnormal reaction of the patient, or of later complication, without mention of misadventure at the time of the procedure: Secondary | ICD-10-CM | POA: Diagnosis present

## 2019-02-18 DIAGNOSIS — M1711 Unilateral primary osteoarthritis, right knee: Secondary | ICD-10-CM | POA: Diagnosis present

## 2019-02-18 DIAGNOSIS — T84032A Mechanical loosening of internal right knee prosthetic joint, initial encounter: Principal | ICD-10-CM | POA: Diagnosis present

## 2019-02-18 HISTORY — PX: TOTAL KNEE REVISION: SHX996

## 2019-02-18 LAB — TYPE AND SCREEN
ABO/RH(D): A POS
Antibody Screen: POSITIVE

## 2019-02-18 SURGERY — TOTAL KNEE REVISION
Anesthesia: Monitor Anesthesia Care | Site: Knee | Laterality: Right

## 2019-02-18 MED ORDER — METOPROLOL TARTRATE 50 MG PO TABS
50.0000 mg | ORAL_TABLET | Freq: Two times a day (BID) | ORAL | Status: DC
  Administered 2019-02-18 – 2019-02-19 (×2): 50 mg via ORAL
  Filled 2019-02-18 (×2): qty 1

## 2019-02-18 MED ORDER — CEFAZOLIN SODIUM-DEXTROSE 2-4 GM/100ML-% IV SOLN
2.0000 g | INTRAVENOUS | Status: AC
  Administered 2019-02-18: 2 g via INTRAVENOUS
  Filled 2019-02-18: qty 100

## 2019-02-18 MED ORDER — OXYCODONE HCL 5 MG PO TABS
5.0000 mg | ORAL_TABLET | ORAL | Status: DC | PRN
  Administered 2019-02-18 – 2019-02-19 (×3): 10 mg via ORAL
  Filled 2019-02-18 (×3): qty 2

## 2019-02-18 MED ORDER — SODIUM CHLORIDE 0.9% FLUSH
INTRAVENOUS | Status: DC | PRN
  Administered 2019-02-18 (×2): 10 mL via INTRADERMAL

## 2019-02-18 MED ORDER — BUPIVACAINE HCL (PF) 0.25 % IJ SOLN
INTRAMUSCULAR | Status: AC
  Filled 2019-02-18: qty 30

## 2019-02-18 MED ORDER — CHLORHEXIDINE GLUCONATE 4 % EX LIQD: 60.0000 mL | Freq: Once | CUTANEOUS | Status: DC

## 2019-02-18 MED ORDER — ASPIRIN 81 MG PO CHEW: 81.0000 mg | CHEWABLE_TABLET | Freq: Two times a day (BID) | ORAL | Status: DC

## 2019-02-18 MED ORDER — BUPIVACAINE LIPOSOME 1.3 % IJ SUSP
INTRAMUSCULAR | Status: DC | PRN
  Administered 2019-02-18: 20 mL

## 2019-02-18 MED ORDER — OXYCODONE HCL 5 MG PO TABS: 5.0000 mg | ORAL_TABLET | ORAL | 0 refills | Status: DC | PRN

## 2019-02-18 MED ORDER — PROBIOTIC PO CAPS: ORAL_CAPSULE | Freq: Every day | ORAL | Status: DC

## 2019-02-18 MED ORDER — ONDANSETRON HCL 4 MG/2ML IJ SOLN
INTRAMUSCULAR | Status: AC
  Filled 2019-02-18: qty 2

## 2019-02-18 MED ORDER — GABAPENTIN 300 MG PO CAPS: ORAL_CAPSULE | ORAL | 0 refills | Status: DC

## 2019-02-18 MED ORDER — ROCURONIUM BROMIDE 10 MG/ML (PF) SYRINGE
PREFILLED_SYRINGE | INTRAVENOUS | Status: AC
  Filled 2019-02-18: qty 10

## 2019-02-18 MED ORDER — APIXABAN 2.5 MG PO TABS
2.5000 mg | ORAL_TABLET | Freq: Two times a day (BID) | ORAL | Status: DC
  Administered 2019-02-19: 2.5 mg via ORAL
  Filled 2019-02-18: qty 1

## 2019-02-18 MED ORDER — ONDANSETRON HCL 4 MG PO TABS: 4.0000 mg | ORAL_TABLET | Freq: Four times a day (QID) | ORAL | Status: DC | PRN

## 2019-02-18 MED ORDER — VANCOMYCIN HCL 1000 MG IV SOLR
INTRAVENOUS | Status: AC
  Filled 2019-02-18: qty 1000

## 2019-02-18 MED ORDER — POVIDONE-IODINE 10 % EX SWAB: 2.0000 "application " | Freq: Once | CUTANEOUS | Status: DC

## 2019-02-18 MED ORDER — ONDANSETRON HCL 4 MG/2ML IJ SOLN: 4.0000 mg | Freq: Four times a day (QID) | INTRAMUSCULAR | Status: DC | PRN

## 2019-02-18 MED ORDER — LIDOCAINE 2% (20 MG/ML) 5 ML SYRINGE
INTRAMUSCULAR | Status: AC
  Filled 2019-02-18: qty 5

## 2019-02-18 MED ORDER — METOCLOPRAMIDE HCL 5 MG/ML IJ SOLN: 5.0000 mg | Freq: Three times a day (TID) | INTRAMUSCULAR | Status: DC | PRN

## 2019-02-18 MED ORDER — MENTHOL 3 MG MT LOZG: 1.0000 | LOZENGE | OROMUCOSAL | Status: DC | PRN

## 2019-02-18 MED ORDER — POLYETHYLENE GLYCOL 3350 17 G PO PACK: 17.0000 g | PACK | Freq: Every day | ORAL | Status: DC | PRN

## 2019-02-18 MED ORDER — EPINEPHRINE PF 1 MG/ML IJ SOLN
INTRAMUSCULAR | Status: AC
  Filled 2019-02-18: qty 1

## 2019-02-18 MED ORDER — PANTOPRAZOLE SODIUM 40 MG PO TBEC
40.0000 mg | DELAYED_RELEASE_TABLET | Freq: Every day | ORAL | Status: DC
  Administered 2019-02-18: 40 mg via ORAL
  Filled 2019-02-18: qty 1

## 2019-02-18 MED ORDER — 0.9 % SODIUM CHLORIDE (POUR BTL) OPTIME
TOPICAL | Status: DC | PRN
  Administered 2019-02-18: 1000 mL

## 2019-02-18 MED ORDER — ONDANSETRON HCL 4 MG/2ML IJ SOLN
INTRAMUSCULAR | Status: DC | PRN
  Administered 2019-02-18: 4 mg via INTRAVENOUS

## 2019-02-18 MED ORDER — VANCOMYCIN HCL IN DEXTROSE 1-5 GM/200ML-% IV SOLN
1000.0000 mg | Freq: Two times a day (BID) | INTRAVENOUS | Status: AC
  Administered 2019-02-18: 1000 mg via INTRAVENOUS
  Filled 2019-02-18: qty 200

## 2019-02-18 MED ORDER — APIXABAN 2.5 MG PO TABS: 2.5000 mg | ORAL_TABLET | Freq: Two times a day (BID) | ORAL | 0 refills | Status: DC

## 2019-02-18 MED ORDER — FENTANYL CITRATE (PF) 250 MCG/5ML IJ SOLN
INTRAMUSCULAR | Status: DC | PRN
  Administered 2019-02-18 (×2): 50 ug via INTRAVENOUS

## 2019-02-18 MED ORDER — PHENYLEPHRINE HCL-NACL 10-0.9 MG/250ML-% IV SOLN
INTRAVENOUS | Status: DC | PRN
  Administered 2019-02-18: 50 ug/min via INTRAVENOUS

## 2019-02-18 MED ORDER — SODIUM CHLORIDE 0.9 % IV SOLN: INTRAVENOUS | Status: DC

## 2019-02-18 MED ORDER — METHOCARBAMOL 500 MG PO TABS
500.0000 mg | ORAL_TABLET | Freq: Three times a day (TID) | ORAL | Status: DC | PRN
  Administered 2019-02-18: 500 mg via ORAL
  Filled 2019-02-18: qty 1

## 2019-02-18 MED ORDER — METOCLOPRAMIDE HCL 5 MG PO TABS: 5.0000 mg | ORAL_TABLET | Freq: Three times a day (TID) | ORAL | Status: DC | PRN

## 2019-02-18 MED ORDER — ACETAMINOPHEN 500 MG PO TABS
1000.0000 mg | ORAL_TABLET | Freq: Four times a day (QID) | ORAL | Status: AC
  Administered 2019-02-18 – 2019-02-19 (×4): 1000 mg via ORAL
  Filled 2019-02-18 (×4): qty 2

## 2019-02-18 MED ORDER — DIPHENHYDRAMINE HCL 12.5 MG/5ML PO ELIX: 12.5000 mg | ORAL_SOLUTION | ORAL | Status: DC | PRN

## 2019-02-18 MED ORDER — PROPOFOL 500 MG/50ML IV EMUL
INTRAVENOUS | Status: DC | PRN
  Administered 2019-02-18: 100 ug/kg/min via INTRAVENOUS

## 2019-02-18 MED ORDER — PHENOL 1.4 % MT LIQD: 1.0000 | OROMUCOSAL | Status: DC | PRN

## 2019-02-18 MED ORDER — SODIUM CHLORIDE 0.9 % IR SOLN
Status: DC | PRN
  Administered 2019-02-18: 3000 mL

## 2019-02-18 MED ORDER — LACTATED RINGERS IV SOLN: INTRAVENOUS | Status: DC

## 2019-02-18 MED ORDER — ACETAMINOPHEN 325 MG PO TABS: 325.0000 mg | ORAL_TABLET | Freq: Four times a day (QID) | ORAL | Status: DC | PRN

## 2019-02-18 MED ORDER — TRANEXAMIC ACID-NACL 1000-0.7 MG/100ML-% IV SOLN
1000.0000 mg | Freq: Once | INTRAVENOUS | Status: AC
  Administered 2019-02-18: 1000 mg via INTRAVENOUS
  Filled 2019-02-18: qty 100

## 2019-02-18 MED ORDER — EPINEPHRINE PF 1 MG/ML IJ SOLN
INTRAMUSCULAR | Status: DC | PRN
  Administered 2019-02-18: .15 mL via SUBCUTANEOUS

## 2019-02-18 MED ORDER — FLEET ENEMA 7-19 GM/118ML RE ENEM: 1.0000 | ENEMA | Freq: Once | RECTAL | Status: DC | PRN

## 2019-02-18 MED ORDER — HYDROMORPHONE HCL 1 MG/ML IJ SOLN: 0.5000 mg | INTRAMUSCULAR | Status: DC | PRN

## 2019-02-18 MED ORDER — FENTANYL CITRATE (PF) 250 MCG/5ML IJ SOLN
INTRAMUSCULAR | Status: AC
  Filled 2019-02-18: qty 5

## 2019-02-18 MED ORDER — PROPOFOL 10 MG/ML IV BOLUS
INTRAVENOUS | Status: AC
  Filled 2019-02-18: qty 20

## 2019-02-18 MED ORDER — BISACODYL 10 MG RE SUPP: 10.0000 mg | Freq: Every day | RECTAL | Status: DC | PRN

## 2019-02-18 MED ORDER — BUPIVACAINE HCL (PF) 0.25 % IJ SOLN
INTRAMUSCULAR | Status: DC | PRN
  Administered 2019-02-18: 30 mL

## 2019-02-18 MED ORDER — TAMSULOSIN HCL 0.4 MG PO CAPS
0.4000 mg | ORAL_CAPSULE | Freq: Every day | ORAL | Status: DC
  Administered 2019-02-19: 0.4 mg via ORAL
  Filled 2019-02-18: qty 1

## 2019-02-18 MED ORDER — DOCUSATE SODIUM 100 MG PO CAPS
100.0000 mg | ORAL_CAPSULE | Freq: Two times a day (BID) | ORAL | Status: DC
  Administered 2019-02-18 – 2019-02-19 (×2): 100 mg via ORAL
  Filled 2019-02-18 (×2): qty 1

## 2019-02-18 MED ORDER — ATORVASTATIN CALCIUM 10 MG PO TABS
10.0000 mg | ORAL_TABLET | Freq: Every day | ORAL | Status: DC
  Administered 2019-02-18: 10 mg via ORAL
  Filled 2019-02-18: qty 1

## 2019-02-18 MED ORDER — DEXAMETHASONE SODIUM PHOSPHATE 10 MG/ML IJ SOLN
INTRAMUSCULAR | Status: AC
  Filled 2019-02-18: qty 1

## 2019-02-18 MED ORDER — VANCOMYCIN HCL IN DEXTROSE 1-5 GM/200ML-% IV SOLN
INTRAVENOUS | Status: AC
  Filled 2019-02-18: qty 200

## 2019-02-18 SURGICAL SUPPLY — 72 items
ANCH SUT 2 CP-2 EBND QANCHR+ (Anchor) ×1 IMPLANT
ANCHOR SUPER QUICK (Anchor) ×2 IMPLANT
BANDAGE ESMARK 6X9 LF (GAUZE/BANDAGES/DRESSINGS) ×1 IMPLANT
BLADE SAGITTAL 25.0X1.19X90 (BLADE) ×1 IMPLANT
BLADE SAGITTAL 25.0X1.19X90MM (BLADE)
BLADE SAW SAG 90X13X1.27 (BLADE) ×3 IMPLANT
BNDG CMPR 9X6 STRL LF SNTH (GAUZE/BANDAGES/DRESSINGS) ×1
BNDG CMPR MED 15X6 ELC VLCR LF (GAUZE/BANDAGES/DRESSINGS) ×1
BNDG ELASTIC 4X5.8 VLCR STR LF (GAUZE/BANDAGES/DRESSINGS) ×1 IMPLANT
BNDG ELASTIC 6X15 VLCR STRL LF (GAUZE/BANDAGES/DRESSINGS) ×2 IMPLANT
BNDG ELASTIC 6X5.8 VLCR STR LF (GAUZE/BANDAGES/DRESSINGS) ×1 IMPLANT
BNDG ESMARK 6X9 LF (GAUZE/BANDAGES/DRESSINGS) ×3
BONE CEMENT GENTAMICIN (Cement) ×3 IMPLANT
BOWL SMART MIX CTS (DISPOSABLE) ×3 IMPLANT
BUR RND DIAMOND ELITE 4.0 (BURR) ×1 IMPLANT
BUR RND DIAMOND ELITE 4.0MM (BURR) ×1
CEMENT BONE GENTAMICIN 40 (Cement) IMPLANT
COVER SURGICAL LIGHT HANDLE (MISCELLANEOUS) ×3 IMPLANT
COVER WAND RF STERILE (DRAPES) ×1 IMPLANT
CUFF TOURN SGL QUICK 34 (TOURNIQUET CUFF) ×3
CUFF TOURN SGL QUICK 42 (TOURNIQUET CUFF) IMPLANT
CUFF TRNQT CYL 34X4.125X (TOURNIQUET CUFF) ×1 IMPLANT
DRAPE INCISE IOBAN 66X45 STRL (DRAPES) IMPLANT
DRAPE ORTHO SPLIT 77X108 STRL (DRAPES) ×6
DRAPE SURG ORHT 6 SPLT 77X108 (DRAPES) ×2 IMPLANT
DRAPE U-SHAPE 47X51 STRL (DRAPES) ×3 IMPLANT
DRSG ADAPTIC 3X8 NADH LF (GAUZE/BANDAGES/DRESSINGS) ×1 IMPLANT
DRSG AQUACEL AG ADV 3.5X10 (GAUZE/BANDAGES/DRESSINGS) ×2 IMPLANT
DRSG PAD ABDOMINAL 8X10 ST (GAUZE/BANDAGES/DRESSINGS) ×2 IMPLANT
DURAPREP 26ML APPLICATOR (WOUND CARE) ×5 IMPLANT
ELECT REM PT RETURN 9FT ADLT (ELECTROSURGICAL) ×3
ELECTRODE REM PT RTRN 9FT ADLT (ELECTROSURGICAL) ×1 IMPLANT
EVACUATOR 1/8 PVC DRAIN (DRAIN) IMPLANT
FACESHIELD WRAPAROUND (MASK) ×3 IMPLANT
FACESHIELD WRAPAROUND OR TEAM (MASK) ×1 IMPLANT
GAUZE SPONGE 4X4 12PLY STRL (GAUZE/BANDAGES/DRESSINGS) ×1 IMPLANT
GLOVE BIOGEL PI IND STRL 8 (GLOVE) ×4 IMPLANT
GLOVE BIOGEL PI INDICATOR 8 (GLOVE) ×4
GLOVE ORTHO TXT STRL SZ7.5 (GLOVE) ×5 IMPLANT
GLOVE SURG ORTHO 8.0 STRL STRW (GLOVE) ×5 IMPLANT
GOWN STRL REUS W/ TWL LRG LVL3 (GOWN DISPOSABLE) ×1 IMPLANT
GOWN STRL REUS W/ TWL XL LVL3 (GOWN DISPOSABLE) ×1 IMPLANT
GOWN STRL REUS W/TWL 2XL LVL3 (GOWN DISPOSABLE) ×3 IMPLANT
GOWN STRL REUS W/TWL LRG LVL3 (GOWN DISPOSABLE) ×3
GOWN STRL REUS W/TWL XL LVL3 (GOWN DISPOSABLE) ×3
HANDPIECE INTERPULSE COAX TIP (DISPOSABLE) ×3
HOOD PEEL AWAY FACE SHEILD DIS (HOOD) ×3 IMPLANT
IMMOBILIZER KNEE 19 UNV (ORTHOPEDIC SUPPLIES) ×2 IMPLANT
IMMOBILIZER KNEE 22 UNIV (SOFTGOODS) ×2 IMPLANT
KIT BASIN OR (CUSTOM PROCEDURE TRAY) ×3 IMPLANT
KIT TURNOVER KIT B (KITS) ×3 IMPLANT
MANIFOLD NEPTUNE II (INSTRUMENTS) ×3 IMPLANT
NEEDLE 22X1 1/2 (OR ONLY) (NEEDLE) ×6 IMPLANT
NS IRRIG 1000ML POUR BTL (IV SOLUTION) ×3 IMPLANT
PACK TOTAL JOINT (CUSTOM PROCEDURE TRAY) ×3 IMPLANT
PAD ARMBOARD 7.5X6 YLW CONV (MISCELLANEOUS) ×6 IMPLANT
PADDING CAST COTTON 6X4 STRL (CAST SUPPLIES) ×3 IMPLANT
PATELLA DOME PFC 41MM (Knees) ×2 IMPLANT
SET HNDPC FAN SPRY TIP SCT (DISPOSABLE) ×1 IMPLANT
STAPLER VISISTAT 35W (STAPLE) ×1 IMPLANT
SUCTION FRAZIER HANDLE 10FR (MISCELLANEOUS) ×2
SUCTION TUBE FRAZIER 10FR DISP (MISCELLANEOUS) ×1 IMPLANT
SUT ETHIBOND NAB CT1 #1 30IN (SUTURE) ×9 IMPLANT
SUT VIC AB 0 CT1 27 (SUTURE) ×6
SUT VIC AB 0 CT1 27XBRD ANBCTR (SUTURE) ×1 IMPLANT
SUT VIC AB 2-0 CT1 27 (SUTURE) ×3
SUT VIC AB 2-0 CT1 TAPERPNT 27 (SUTURE) ×2 IMPLANT
SYR CONTROL 10ML LL (SYRINGE) ×6 IMPLANT
TOWEL GREEN STERILE (TOWEL DISPOSABLE) ×3 IMPLANT
TRAY CATH 16FR W/PLASTIC CATH (SET/KITS/TRAYS/PACK) IMPLANT
TRAY FOLEY MTR SLVR 16FR STAT (SET/KITS/TRAYS/PACK) IMPLANT
WATER STERILE IRR 1000ML POUR (IV SOLUTION) ×1 IMPLANT

## 2019-02-18 NOTE — Progress Notes (Signed)
Orthopedic Tech Progress Note Patient Details:  Gene Hill 1940-09-28 [id number]  CPM Right Knee Right Knee Flexion (Degrees): 0 Right Knee Extension (Degrees): 90 Additional Comments: added ice  Post Interventions Patient Tolerated: Well Instructions Provided: Care of device, Adjustment of device Ortho Devices Type of Ortho Device: Bone foam zero knee Ortho Device/Splint Interventions: Application, Ordered   Post Interventions Patient Tolerated: Well Instructions Provided: Care of device, Adjustment of device   [name] 02/18/2019, 10:34 AM

## 2019-02-18 NOTE — Evaluation (Signed)
Physical Therapy Evaluation Patient Details Name: BOOKERT GUZZI MRN: [id number] DOB: 1940/07/03 Today's Date: 02/18/2019   History of Present Illness  79yo male s/p R TKR revision 02/18/19. PMH blood dyscrasia, CAD, HLD, HTN, multiple myeloma, back and neck surgery, B reverse TSAs, R TKR  Clinical Impression   Patient received in bed, very pleasant and motivated to participate in PT today. Mobility levels as below. R knee ROM in supine 8-78 degrees. Required cues for staying appropriate distance from RW/safe use of RW as well as cues for hand placement and safety with transfers today. VSS on room air. Able to maintain balance and reach out of BOS without BUE support but did require min guard for safety. Limited gait distance to in-room distances due to RLE still being numb from surgical nerve block. He was left up in the chair with all needs met this afternoon- do recommend that therapy sees him one more time prior to DC home to reinforce safety with mobility and practice stairs.     Follow Up Recommendations Follow surgeon's recommendation for DC plan and follow-up therapies    Equipment Recommendations  None recommended by PT(well equipped)    Recommendations for Other Services       Precautions / Restrictions Precautions Precautions: Fall Required Braces or Orthoses: Knee Immobilizer - Right Knee Immobilizer - Right: On except when in CPM Restrictions Weight Bearing Restrictions: No Other Position/Activity Restrictions: WBAT in KI      Mobility  Bed Mobility Overal bed mobility: Needs Assistance Bed Mobility: Supine to Sit     Supine to sit: Min guard     General bed mobility comments: min guard for safety, no physical assist given  Transfers Overall transfer level: Needs assistance Equipment used: Rolling walker (2 wheeled) Transfers: Sit to/from Stand Sit to Stand: Min guard         General transfer comment: min guard for safety, cues for hand placement and  sequencing  Ambulation/Gait Ambulation/Gait assistance: Min guard Gait Distance (Feet): 55 Feet(68f, then 375f then 2065fAssistive device: Rolling walker (2 wheeled) Gait Pattern/deviations: Step-through pattern;Decreased step length - left;Decreased stance time - right;Decreased stride length;Decreased weight shift to right;Trunk flexed;Wide base of support Gait velocity: decreased   General Gait Details: cues for staying appropriate distance from RW, step through pattern; limited gait distance in room due to RLE still being numb from surgical block  Stairs            Wheelchair Mobility    Modified Rankin (Stroke Patients Only)       Balance Overall balance assessment: Mild deficits observed, not formally tested                                           Pertinent Vitals/Pain Pain Assessment: Faces Pain Score: 0-No pain Faces Pain Scale: No hurt Pain Intervention(s): Limited activity within patient's tolerance;Monitored during session    Home Living Family/patient expects to be discharged to:: Private residence Living Arrangements: Spouse/significant other;Children Available Help at Discharge: Family;Available 24 hours/day Type of Home: House Home Access: Stairs to enter Entrance Stairs-Rails: Left Entrance Stairs-Number of Steps: 2 and then 3 Home Layout: Two level Home Equipment: Walker - 2 wheels;Cane - single point;Bedside commode      Prior Function Level of Independence: Independent         Comments: occasional use of SPC when outdoors  Hand Dominance   Dominant Hand: Left    Extremity/Trunk Assessment   Upper Extremity Assessment Upper Extremity Assessment: Overall WFL for tasks assessed    Lower Extremity Assessment Lower Extremity Assessment: Overall WFL for tasks assessed    Cervical / Trunk Assessment Cervical / Trunk Assessment: Normal  Communication   Communication: No difficulties  Cognition  Arousal/Alertness: Awake/alert Behavior During Therapy: WFL for tasks assessed/performed Overall Cognitive Status: Within Functional Limits for tasks assessed                                        General Comments General comments (skin integrity, edema, etc.): able to reach outside BOS without UE support, min guard    Exercises Total Joint Exercises Goniometric ROM: R knee ROM in supine: 8 degrees extension, 78 degrees flexion   Assessment/Plan    PT Assessment Patient needs continued PT services  PT Problem List Decreased range of motion;Decreased knowledge of use of DME;Decreased safety awareness;Decreased balance;Decreased knowledge of precautions;Decreased mobility;Decreased coordination       PT Treatment Interventions DME instruction;Balance training;Gait training;Stair training;Functional mobility training;Patient/family education;Therapeutic activities;Therapeutic exercise    PT Goals (Current goals can be found in the Care Plan section)  Acute Rehab PT Goals Patient Stated Goal: get moving, home tomorrow PT Goal Formulation: With patient Time For Goal Achievement: 03/04/19 Potential to Achieve Goals: Good    Frequency 7X/week   Barriers to discharge        Co-evaluation               AM-PAC PT "6 Clicks" Mobility  Outcome Measure Help needed turning from your back to your side while in a flat bed without using bedrails?: A Little Help needed moving from lying on your back to sitting on the side of a flat bed without using bedrails?: A Little Help needed moving to and from a bed to a chair (including a wheelchair)?: A Little Help needed standing up from a chair using your arms (e.g., wheelchair or bedside chair)?: A Little Help needed to walk in hospital room?: A Little Help needed climbing 3-5 steps with a railing? : A Little 6 Click Score: 18    End of Session Equipment Utilized During Treatment: Gait belt;Right knee  immobilizer Activity Tolerance: Patient tolerated treatment well Patient left: in chair;with call bell/phone within reach Nurse Communication: Mobility status;Weight bearing status PT Visit Diagnosis: Difficulty in walking, not elsewhere classified (R26.2);Pain;Other abnormalities of gait and mobility (R26.89) Pain - Right/Left: Right Pain - part of body: Knee    Time: 1330-1413 PT Time Calculation (min) (ACUTE ONLY): 43 min   Charges:   PT Evaluation $PT Eval Low Complexity: 1 Low PT Treatments $Gait Training: 8-22 mins $Therapeutic Activity: 8-22 mins        Windell Norfolk, DPT, PN1   Supplemental Physical Therapist [place]    Pager [phone] Acute Rehab Office [phone]

## 2019-02-18 NOTE — Brief Op Note (Signed)
02/18/2019  9:32 AM  PATIENT:  Gene Hill  79 y.o. male  PRE-OPERATIVE DIAGNOSIS:  Right total knee hardware failure  POST-OPERATIVE DIAGNOSIS:  Right total knee hardware failure  PROCEDURE:  Procedure(s): TOTAL KNEE REVISION (Right)  SURGEON:  Surgeon(s) and Role:    Earlie Server, MD - Primary  PHYSICIAN ASSISTANT: Chriss Czar, PA-C  ASSISTANTS: OR staff x1   ANESTHESIA:   local, regional, spinal and IV sedation  EBL:  30 mL   BLOOD ADMINISTERED:none  DRAINS: none   LOCAL MEDICATIONS USED:  MARCAINE     SPECIMEN:  Source of Specimen:  right knee culture  DISPOSITION OF SPECIMEN:  micro  COUNTS:  YES  TOURNIQUET:   Total Tourniquet Time Documented: Thigh (Right) - 41 minutes Total: Thigh (Right) - 41 minutes   DICTATION: .Other Dictation: Dictation Number unknown  PLAN OF CARE: Discharge to home after PACU  PATIENT DISPOSITION:  PACU - hemodynamically stable.   Delay start of Pharmacological VTE agent (>24hrs) due to surgical blood loss or risk of bleeding: yes

## 2019-02-18 NOTE — Transfer of Care (Signed)
Immediate Anesthesia Transfer of Care Note  Patient: Gene Hill  Procedure(s) Performed: TOTAL KNEE REVISION (Right Knee)  Patient Location: PACU  Anesthesia Type:MAC and Spinal  Level of Consciousness: drowsy and patient cooperative  Airway & Oxygen Therapy: Patient Spontanous Breathing  Post-op Assessment: Report given to RN  Post vital signs: Reviewed and stable  Last Vitals:  Vitals Value Taken Time  BP 98/57 02/18/19 0944  Temp    Pulse 67 02/18/19 0945  Resp 15 02/18/19 0945  SpO2 99 % 02/18/19 0945  Vitals shown include unvalidated device data.  Last Pain:  Vitals:   02/18/19 0622  TempSrc:   PainSc: 1       Patients Stated Pain Goal: 4 (83/38/25 0539)  Complications: No apparent anesthesia complications

## 2019-02-18 NOTE — Anesthesia Procedure Notes (Signed)
Procedure Name: MAC Date/Time: 02/18/2019 7:38 AM Performed by: Barrington Ellison, CRNA Pre-anesthesia Checklist: Patient identified, Emergency Drugs available, Suction available, Patient being monitored and Timeout performed Patient Re-evaluated:Patient Re-evaluated prior to induction Oxygen Delivery Method: Simple face mask

## 2019-02-18 NOTE — Plan of Care (Signed)

## 2019-02-18 NOTE — Op Note (Signed)
NAME: Gene Hill, Gene Hill MEDICAL RECORD [id number] ACCOUNT [id number] DATE OF BIRTH:[birth year] FACILITY: [facility] LOCATION: [facility] PHYSICIAN:W. Bodi Palmeri JR., MD  OPERATIVE REPORT  DATE OF PROCEDURE:  02/18/2019  PREOPERATIVE DIAGNOSIS:  Loose polyethylene patellar component, status post Sigma total knee, right.  POSTOPERATIVE DIAGNOSIS:  Loose polyethylene patellar component, status post Sigma total knee, right.  OPERATION:  Removal of patellar component with revision patella (1 component, right knee, with 3 peg 41 mm oval dome patella).  SURGEON:  Vangie Bicker, MD  ASSISTANTMarjo Bicker, MD   ANESTHESIA:  Spinal with block.  TOURNIQUET TIME:  45 minutes.  DESCRIPTION OF PROCEDURE:  Exsanguination of leg inflation of thigh tourniquet 350.  The actual patellar button had come loose and become present into the subcutaneous tissues.  It had escaped the capsular closure and become somewhat encapsulated.  We  did a medial parapatellar approach to the knee to remove the button.  The button itself showed minimal wear.  All 3 of the polyethylene pegs were still within the native patella.  We had to bur those and drill those out.  We freshened the bone, which was  moderately sclerotic to accept another 41 mm patella, which was trialled.  The cement was burred and sawed relative to create a freshened patellar surface.  Culture was obtained although there was no evidence of active infection.  The knee was basically  fully extended, good flexion, and there was no instability, so we elected not to do a poly exchange relative to the tibia.  With the approach we were very careful; however, as insurance when we closed the medial parapatellar approach on the tibial side  on the tuberosity, we put a Mitek anchor through the tuberosity on the tendon, although we did not have any evidence of disruption of the patellar tendon.  Cement was prepared on the back table were antibiotic cement, gentamicin.   Patella was placed with  cement in the doughy state, allowed to harden.  Again, all parameters deemed to be acceptable.  Specifically, patellar tracking.  Closure was affected.  In addition to the Mitek anchor placed inferiorly we did multiple interrupted #1 Ethibond, 0 and 2-0  Vicryl and Monocryl in the skin.  Lightly compressive sterile dressing was applied.  Tourniquet was released after cementing of the patella, before closure.  No excessive bleeding was noted.  CN/NUANCE  D:02/18/2019 T:02/18/2019 JOB:[id number]

## 2019-02-18 NOTE — Interval H&P Note (Signed)
History and Physical Interval Note:  02/18/2019 7:30 AM  Gene Hill  has presented today for surgery, with the diagnosis of Right total knee hardware failure.  The various methods of treatment have been discussed with the patient and family. After consideration of risks, benefits and other options for treatment, the patient has consented to  Procedure(s): TOTAL KNEE REVISION (Right) as a surgical intervention.  The patient's history has been reviewed, patient examined, no change in status, stable for surgery.  I have reviewed the patient's chart and labs.  Questions were answered to the patient's satisfaction.     Yvette Rack

## 2019-02-19 ENCOUNTER — Encounter: Payer: Self-pay | Admitting: *Deleted

## 2019-02-19 LAB — CBC
HCT: 31.4 % — ABNORMAL LOW (ref 39.0–52.0)
Hemoglobin: 10 g/dL — ABNORMAL LOW (ref 13.0–17.0)
MCH: 33.7 pg (ref 26.0–34.0)
MCHC: 31.8 g/dL (ref 30.0–36.0)
MCV: 105.7 fL — ABNORMAL HIGH (ref 80.0–100.0)
Platelets: 131 10*3/uL — ABNORMAL LOW (ref 150–400)
RBC: 2.97 MIL/uL — ABNORMAL LOW (ref 4.22–5.81)
RDW: 15.5 % (ref 11.5–15.5)
WBC: 6.5 10*3/uL (ref 4.0–10.5)
nRBC: 0 % (ref 0.0–0.2)

## 2019-02-19 LAB — BASIC METABOLIC PANEL
Anion gap: 5 (ref 5–15)
BUN: 11 mg/dL (ref 8–23)
CO2: 26 mmol/L (ref 22–32)
Calcium: 8.3 mg/dL — ABNORMAL LOW (ref 8.9–10.3)
Chloride: 109 mmol/L (ref 98–111)
Creatinine, Ser: 0.89 mg/dL (ref 0.61–1.24)
GFR calc Af Amer: >60 mL/min (ref >60)
GFR calc non Af Amer: >60 mL/min (ref >60)
Glucose, Bld: 106 mg/dL — ABNORMAL HIGH (ref 70–99)
Potassium: 4.3 mmol/L (ref 3.5–5.1)
Sodium: 140 mmol/L (ref 135–145)

## 2019-02-19 NOTE — Discharge Summary (Signed)
PATIENT ID: Gene Hill        MRN:  [id number]          DOB/AGE: 24-May-1940 / 79 y.o.    DISCHARGE SUMMARY  ADMISSION DATE:    02/18/2019 DISCHARGE DATE:   02/19/2019   ADMISSION DIAGNOSIS: Failed total knee, right ([place]) [T84.012A]    DISCHARGE DIAGNOSIS:  Right total knee hardware failure    ADDITIONAL DIAGNOSIS: Active Problems:   Failed total knee, right ([facility])  Past Medical History:  Diagnosis Date  . Arthritis   . 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 Blood dyscrasia    low WBC d/t multiple myeloma  . Bulging lumbar disc    L2-3  . Carotid bruit    3-11 carotid u/s was  (-)  . 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 GERD (gastroesophageal reflux disease)   . Headache   . Heart murmur    doesn't have one  . History of hiatal hernia   . HYPERLIPIDEMIA 05/03/2006  . HYPERTENSION 05/03/2006  . 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 Torn rotator cuff 01/22/2017   left- Caffreyy    PROCEDURE: Procedure(s): TOTAL KNEE REVISION Right on 02/18/2019  CONSULTS: PT    HISTORY:  See H&P in chart  HOSPITAL COURSE:  Gene Hill is a 79 y.o. admitted on 02/18/2019 and found to have a diagnosis of Right total knee hardware failure.  After appropriate laboratory studies were obtained  they were taken to the operating room on 02/18/2019 and underwent  Procedure(s): TOTAL KNEE REVISION  Right.   They were given perioperative antibiotics:  Anti-infectives (From admission, onward)   Start     Dose/Rate Route Frequency Ordered Stop   02/18/19 2000  vancomycin (VANCOCIN) IVPB 1000 mg/200 mL premix     1,000 mg 200 mL/hr over 60 Minutes Intravenous Every 12 hours 02/18/19 1218 02/18/19 2129   02/18/19 0600  ceFAZolin (ANCEF) IVPB 2g/100 mL premix     2 g 200 mL/hr over 30 Minutes Intravenous  On call to O.R. 02/18/19 0559 02/18/19 0747    .  Tolerated the procedure well.  In-and-out cath intraoperatively.     POD #1, allowed out of bed to a chair.  PT for ambulation and exercise program.  IV saline locked.  O2 discontionued.  Dressing was saturated, changed dsg.  The remainder of the hospital course was dedicated to ambulation and strengthening.   The patient was discharged on 1 Day Post-Op in  Stable condition.  Blood products given:none  DIAGNOSTIC STUDIES: Recent vital signs:  Patient Vitals for the past 24 hrs:  BP Temp Temp src Pulse Resp SpO2  02/19/19 0818 131/79 98.3 F (36.8 C) Oral 93 17 95 %  02/19/19 0406 124/74 98.8 F (37.1 C) Oral 75 16 96 %  02/18/19 2232 133/72 -- -- 88 -- --  02/18/19 2035 (!) 147/82 98.5 F (36.9 C) Oral 79 18 93 %  02/18/19 1345 127/72 98.2 F (36.8 C) Oral 73 18 100 %  02/18/19 1214 127/83 97.6 F (36.4 C) Oral 64 17 100 %  02/18/19 1150 -- 97.8 F (36.6 C) -- -- -- --  02/18/19 1149 -- -- -- 61 14 99 %  02/18/19 1148 -- -- -- (!) 59 13 100 %  02/18/19 1145 -- -- -- 62 12 96 %  02/18/19 1144 108/67 -- -- (!) 58 11 100 %  02/18/19 1130 -- -- -- (!) 59 12 100 %  02/18/19 1129 99/69 -- -- (!) 59 13 100 %  02/18/19 1115 -- -- -- 64 16 100 %  02/18/19 1114 108/69 -- -- 63 15 98 %  02/18/19 1100 -- -- -- 62 12 99 %  02/18/19 1059 104/65 -- -- 60 11 100 %  02/18/19 1045 -- -- -- 63 13 100 %  02/18/19 1044 96/64 -- -- (!) 59 10 100 %  02/18/19 1030 -- -- -- 65 11 100 %  02/18/19 1029 101/63 -- -- 63 10 100 %  02/18/19 1015 102/61 -- -- 65 13 100 %  02/18/19 1014 95/63 -- -- 65 15 99 %  02/18/19 1000 -- -- -- 68 10 98 %  02/18/19 0959 97/61 -- -- 71 10 98 %  02/18/19 0945 -- -- -- 67 15 99 %  02/18/19 0944 (!) 98/57 97.6 F (36.4 C) -- 70 (!) 7 98 %       Recent laboratory studies: Recent Labs    02/19/19 0208  WBC 6.5  HGB 10.0*  HCT 31.4*  PLT 131*   Recent Labs    02/19/19 0208  NA 140  K 4.3  CL 109  CO2  26  BUN 11  CREATININE 0.89  GLUCOSE 106*  CALCIUM 8.3*   Lab Results  Component Value Date   INR 1.0 02/10/2019   INR 0.91 11/29/2016   INR 1.02 12/12/2015   PROTIME Sent out for confirmation 04/10/2010     Recent Radiographic Studies :  No results found.  DISCHARGE INSTRUCTIONS:   DISCHARGE MEDICATIONS:   Allergies as of 02/19/2019   No Known Allergies     Medication List    TAKE these medications   acetaminophen 500 MG tablet Commonly known as: TYLENOL Take 1,000 mg by mouth 3 (three) times daily as needed.   apixaban 2.5 MG Tabs tablet Commonly known as: Eliquis Take 1 tablet (2.5 mg total) by mouth 2 (two) times daily.   atorvastatin 10 MG tablet Commonly known as: LIPITOR Take 1 tablet (10 mg total) by mouth at bedtime.   b complex vitamins tablet Take 1 tablet by mouth daily.   calcium carbonate 1500 (600 Ca) MG Tabs tablet Commonly known as: OSCAL Take 600 mg of elemental calcium by mouth daily with breakfast.   DARZALEX IV Inject into the vein every 28 (twenty-eight) days.   dexamethasone 2 MG tablet Commonly known as: DECADRON TAKE 2 TABLETS BY MOUTH DAY AFTER CHEMO THEN 1 TABLET EVERY DAY FOR 4 DAYS What changed:   how much to take  how to take this  when to take this  additional instructions   Fish Oil 1200 MG Caps Take 1,200 mg by mouth daily.   fluticasone 50 MCG/ACT nasal spray Commonly known as: FLONASE Place 1 spray into both nostrils at bedtime.   gabapentin 300 MG capsule Commonly known as: Neurontin Take 1 capsule (300 mg total) by mouth 3 (three) times daily for 14 days, THEN 1 capsule (300 mg total) 2 (two) times daily for 3 days, THEN 1 capsule (300 mg total) daily for 4 days. Start taking on: February 18, 2019   methocarbamol 500 MG tablet Commonly known as: ROBAXIN Take 1 tablet (500 mg total) by mouth 3 (three) times daily as needed for muscle spasms.   metoprolol tartrate  100 MG tablet Commonly known as:  LOPRESSOR Take 0.5 tablets (50 mg total) by mouth 2 (two) times daily.   multivitamin tablet Take 1 tablet by mouth daily.   oxyCODONE 5 MG immediate release tablet Commonly known as: Oxy IR/ROXICODONE Take 1 tablet (5 mg total) by mouth every 4 (four) hours as needed for severe pain (may need 1-2 tabs first couple weeks following surgery). What changed:   when to take this  reasons to take this   pantoprazole 40 MG tablet Commonly known as: PROTONIX Take 1 tablet (40 mg total) by mouth at bedtime.   PROBIOTIC PO Take 1 tablet by mouth daily.   tamsulosin 0.4 MG Caps capsule Commonly known as: FLOMAX Take 0.4 mg by mouth daily.   VITAMIN B-12 PO Take 5,000 mcg by mouth daily.       FOLLOW UP VISIT:   Follow-up Information    Caffrey, Daniel, MD. Schedule an appointment as soon as possible for a visit in 2 weeks.   Specialty: Orthopedic Surgery Contact information: [address] [place] [address] [phone]           DISPOSITION:   Home  CONDITION:  Stable   Joshua Chadwell, PA-C  02/19/2019 9:29 AM    

## 2019-02-19 NOTE — Discharge Instructions (Signed)
INSTRUCTIONS AFTER JOINT REPLACEMENT  ° °o Remove items at home which could result in a fall. This includes throw rugs or furniture in walking pathways °o ICE to the affected joint every three hours while awake for 30 minutes at a time, for at least the first 3-5 days, and then as needed for pain and swelling.  Continue to use ice for pain and swelling. You may notice swelling that will progress down to the foot and ankle.  This is normal after surgery.  Elevate your leg when you are not up walking on it.   °o Continue to use the breathing machine you got in the hospital (incentive spirometer) which will help keep your temperature down.  It is common for your temperature to cycle up and down following surgery, especially at night when you are not up moving around and exerting yourself.  The breathing machine keeps your lungs expanded and your temperature down. ° ° °DIET:  As you were doing prior to hospitalization, we recommend a well-balanced diet. ° °DRESSING / WOUND CARE / SHOWERING ° °Keep the surgical dressing until follow up.  The dressing is water proof, so you can shower without any extra covering.  IF THE DRESSING FALLS OFF or the wound gets wet inside, change the dressing with sterile gauze.  Please use good hand washing techniques before changing the dressing.  Do not use any lotions or creams on the incision until instructed by your surgeon.   ° °ACTIVITY ° °o Increase activity slowly as tolerated, but follow the weight bearing instructions below.   °o No driving for 6 weeks or until further direction given by your physician.  You cannot drive while taking narcotics.  °o No lifting or carrying greater than 10 lbs. until further directed by your surgeon. °o Avoid periods of inactivity such as sitting longer than an hour when not asleep. This helps prevent blood clots.  °o You may return to work once you are authorized by your doctor.  ° ° ° °WEIGHT BEARING  ° °Weight bearing as tolerated with assist  device (walker, cane, etc) as directed, use it as long as suggested by your surgeon or therapist, typically at least 4-6 weeks. ° ° °EXERCISES ° °Results after joint replacement surgery are often greatly improved when you follow the exercise, range of motion and muscle strengthening exercises prescribed by your doctor. Safety measures are also important to protect the joint from further injury. Any time any of these exercises cause you to have increased pain or swelling, decrease what you are doing until you are comfortable again and then slowly increase them. If you have problems or questions, call your caregiver or physical therapist for advice.  ° °Rehabilitation is important following a joint replacement. After just a few days of immobilization, the muscles of the leg can become weakened and shrink (atrophy).  These exercises are designed to build up the tone and strength of the thigh and leg muscles and to improve motion. Often times heat used for twenty to thirty minutes before working out will loosen up your tissues and help with improving the range of motion but do not use heat for the first two weeks following surgery (sometimes heat can increase post-operative swelling).  ° °These exercises can be done on a training (exercise) mat, on the floor, on a table or on a bed. Use whatever works the best and is most comfortable for you.    Use music or television while you are exercising so that   the exercises are a pleasant break in your day. This will make your life better with the exercises acting as a break in your routine that you can look forward to.   Perform all exercises about fifteen times, three times per day or as directed.  You should exercise both the operative leg and the other leg as well. ° °Exercises include: °  °• Quad Sets - Tighten up the muscle on the front of the thigh (Quad) and hold for 5-10 seconds.   °• Straight Leg Raises - With your knee straight (if you were given a brace, keep it on),  lift the leg to 60 degrees, hold for 3 seconds, and slowly lower the leg.  Perform this exercise against resistance later as your leg gets stronger.  °• Leg Slides: Lying on your back, slowly slide your foot toward your buttocks, bending your knee up off the floor (only go as far as is comfortable). Then slowly slide your foot back down until your leg is flat on the floor again.  °• Angel Wings: Lying on your back spread your legs to the side as far apart as you can without causing discomfort.  °• Hamstring Strength:  Lying on your back, push your heel against the floor with your leg straight by tightening up the muscles of your buttocks.  Repeat, but this time bend your knee to a comfortable angle, and push your heel against the floor.  You may put a pillow under the heel to make it more comfortable if necessary.  ° °A rehabilitation program following joint replacement surgery can speed recovery and prevent re-injury in the future due to weakened muscles. Contact your doctor or a physical therapist for more information on knee rehabilitation.  ° ° °CONSTIPATION ° °Constipation is defined medically as fewer than three stools per week and severe constipation as less than one stool per week.  Even if you have a regular bowel pattern at home, your normal regimen is likely to be disrupted due to multiple reasons following surgery.  Combination of anesthesia, postoperative narcotics, change in appetite and fluid intake all can affect your bowels.  ° °YOU MUST use at least one of the following options; they are listed in order of increasing strength to get the job done.  They are all available over the counter, and you may need to use some, POSSIBLY even all of these options:   ° °Drink plenty of fluids (prune juice may be helpful) and high fiber foods °Colace 100 mg by mouth twice a day  °Senokot for constipation as directed and as needed Dulcolax (bisacodyl), take with full glass of water  °Miralax (polyethylene glycol)  once or twice a day as needed. ° °If you have tried all these things and are unable to have a bowel movement in the first 3-4 days after surgery call either your surgeon or your primary doctor.   ° °If you experience loose stools or diarrhea, hold the medications until you stool forms back up.  If your symptoms do not get better within 1 week or if they get worse, check with your doctor.  If you experience "the worst abdominal pain ever" or develop nausea or vomiting, please contact the office immediately for further recommendations for treatment. ° ° °ITCHING:  If you experience itching with your medications, try taking only a single pain pill, or even half a pain pill at a time.  You can also use Benadryl over the counter for itching or also to   help with sleep.  ° °TED HOSE STOCKINGS:  Use stockings on both legs until for at least 2 weeks or as directed by physician office. They may be removed at night for sleeping. ° °MEDICATIONS:  See your medication summary on the “After Visit Summary” that nursing will review with you.  You may have some home medications which will be placed on hold until you complete the course of blood thinner medication.  It is important for you to complete the blood thinner medication as prescribed. ° °PRECAUTIONS:  If you experience chest pain or shortness of breath - call 911 immediately for transfer to the hospital emergency department.  ° °If you develop a fever greater that 101 F, purulent drainage from wound, increased redness or drainage from wound, foul odor from the wound/dressing, or calf pain - CONTACT YOUR SURGEON.   °                                                °FOLLOW-UP APPOINTMENTS:  If you do not already have a post-op appointment, please call the office for an appointment to be seen by your surgeon.  Guidelines for how soon to be seen are listed in your “After Visit Summary”, but are typically between 1-4 weeks after surgery. ° °OTHER INSTRUCTIONS:  ° °Knee  Replacement:  Do not place pillow under knee, focus on keeping the knee straight while resting. CPM instructions: 0-90 degrees, 2 hours in the morning, 2 hours in the afternoon, and 2 hours in the evening. Place foam block, curve side up under heel at all times except when in CPM or when walking.  DO NOT modify, tear, cut, or change the foam block in any way. ° °MAKE SURE YOU:  °• Understand these instructions.  °• Get help right away if you are not doing well or get worse.  ° ° °Thank you for letting us be a part of your medical care team.  It is a privilege we respect greatly.  We hope these instructions will help you stay on track for a fast and full recovery!  ° ° °Information on my medicine - ELIQUIS® (apixaban) ° °Why was Eliquis® prescribed for you? °Eliquis® was prescribed for you to reduce the risk of blood clots forming after orthopedic surgery.   ° °What do You need to know about Eliquis®? °Take your Eliquis® TWICE DAILY - one tablet in the morning and one tablet in the evening with or without food.  It would be best to take the dose about the same time each day. ° °If you have difficulty swallowing the tablet whole please discuss with your pharmacist how to take the medication safely. ° °Take Eliquis® exactly as prescribed by your doctor and DO NOT stop taking Eliquis® without talking to the doctor who prescribed the medication.  Stopping without other medication to take the place of Eliquis® may increase your risk of developing a clot. ° °After discharge, you should have regular check-up appointments with your healthcare provider that is prescribing your Eliquis®. ° °What do you do if you miss a dose? °If a dose of ELIQUIS® is not taken at the scheduled time, take it as soon as possible on the same day and twice-daily administration should be resumed.  The dose should not be doubled to make up for a missed dose.  Do not take more   than one tablet of ELIQUIS at the same time. ° °Important Safety  Information °A possible side effect of Eliquis® is bleeding. You should call your healthcare provider right away if you experience any of the following: °? Bleeding from an injury or your nose that does not stop. °? Unusual colored urine (red or dark brown) or unusual colored stools (red or black). °? Unusual bruising for unknown reasons. °? A serious fall or if you hit your head (even if there is no bleeding). ° °Some medicines may interact with Eliquis® and might increase your risk of bleeding or clotting while on Eliquis®. To help avoid this, consult your healthcare provider or pharmacist prior to using any new prescription or non-prescription medications, including herbals, vitamins, non-steroidal anti-inflammatory drugs (NSAIDs) and supplements. ° °This website has more information on Eliquis® (apixaban): http://www.eliquis.com/eliquis/home ° °

## 2019-02-19 NOTE — Progress Notes (Signed)
Pt given discharge instructions and gone over with him. He verbalized understanding. All belongings gathered to be sent home. Waiting on wife for ride home.

## 2019-02-19 NOTE — Progress Notes (Signed)
Physical Therapy Treatment Patient Details Name: LABRADFORD SCHNITKER MRN: [id number] DOB: 11-13-1940 Today's Date: 02/19/2019    History of Present Illness 79yo male s/p R TKR revision 02/18/19. PMH blood dyscrasia, CAD, HLD, HTN, multiple myeloma, back and neck surgery, B reverse TSAs, R TKR    PT Comments    Pt very pleasant in CPM on arrival and reports mild pain at rest. Pt noted to have dressing saturation with removal of CPM with increased drainage end of session and RN aware. Pt able to perform SLR without lag and walked short distance without KI but reported partial buckle with KI resumed for long hall and stair ambulation. Pt educated for gait, stairs, HEP and progression. Pt safe for return home and noted fatigue with activity with conversation for sleeping downstairs as needed.     Follow Up Recommendations  Follow surgeon's recommendation for DC plan and follow-up therapies;Home health PT     Equipment Recommendations  None recommended by PT    Recommendations for Other Services       Precautions / Restrictions Precautions Precautions: Fall;Knee Restrictions RLE Weight Bearing: Weight bearing as tolerated    Mobility  Bed Mobility Overal bed mobility: Modified Independent             General bed mobility comments: HOB 15 degrees with pt able to transition to EOB without assist  Transfers Overall transfer level: Needs assistance   Transfers: Sit to/from Stand Sit to Stand: Min guard         General transfer comment: pt able to sit/stand at bed/toilet and to recliner. Cues for RLE positioning and safety  Ambulation/Gait Ambulation/Gait assistance: Min guard Gait Distance (Feet): 140 Feet Assistive device: Rolling walker (2 wheeled) Gait Pattern/deviations: Step-through pattern;Trunk flexed;Decreased stride length;Decreased weight shift to right   Gait velocity interpretation: 1.31 - 2.62 ft/sec, indicative of limited community ambulator General Gait  Details: pt able to walk 15' to toilet without KI then back to EOB for donning KI as pt stating on return partial buckling and preference for KI for safety at this time. Pt then ambulated 140' with KI and RW with cues for position in RW and hand placement   Stairs Stairs: Yes Stairs assistance: Min assist;Min guard Stair Management: Step to pattern;Backwards;With walker;One rail Left;Forwards Number of Stairs: 2 General stair comments: pt performed 2 steps with RW backward with min assist and cues for sequence with handout provided. then additional 2 steps with left rail with forward sequence with bil hands on rail and cues for sequence. pt preferred rail sequence despite twisting trunk and will have rail on right for flight of stairs at home   Wheelchair Mobility    Modified Rankin (Stroke Patients Only)       Balance Overall balance assessment: Mild deficits observed, not formally tested                                          Cognition Arousal/Alertness: Awake/alert Behavior During Therapy: WFL for tasks assessed/performed Overall Cognitive Status: Within Functional Limits for tasks assessed                                        Exercises Total Joint Exercises Heel Slides: AROM;Right;Supine;10 reps Hip ABduction/ADduction: AROM;Right;Supine;10 reps Straight Leg Raises: AROM;Both;Supine;10 reps Goniometric ROM:  3-85    General Comments        Pertinent Vitals/Pain Pain Score: 3  Pain Location: Rt knee. 3/10 at rest, 6-7/10 with movement and gait Pain Descriptors / Indicators: Guarding;Aching;Constant Pain Intervention(s): Limited activity within patient's tolerance;Monitored during session;RN gave pain meds during session;Repositioned;Ice applied    Home Living                      Prior Function            PT Goals (current goals can now be found in the care plan section) Progress towards PT goals: Progressing toward  goals    Frequency    7X/week      PT Plan Current plan remains appropriate    Co-evaluation              AM-PAC PT "6 Clicks" Mobility   Outcome Measure  Help needed turning from your back to your side while in a flat bed without using bedrails?: None Help needed moving from lying on your back to sitting on the side of a flat bed without using bedrails?: None Help needed moving to and from a bed to a chair (including a wheelchair)?: A Little Help needed standing up from a chair using your arms (e.g., wheelchair or bedside chair)?: A Little Help needed to walk in hospital room?: A Little Help needed climbing 3-5 steps with a railing? : A Little 6 Click Score: 20    End of Session Equipment Utilized During Treatment: Right knee immobilizer Activity Tolerance: Patient tolerated treatment well Patient left: in chair;with call bell/phone within reach Nurse Communication: Mobility status;Weight bearing status PT Visit Diagnosis: Difficulty in walking, not elsewhere classified (R26.2);Pain;Other abnormalities of gait and mobility (R26.89)     Time: 1638-4536 PT Time Calculation (min) (ACUTE ONLY): 34 min  Charges:  $Gait Training: 8-22 mins $Therapeutic Exercise: 8-22 mins                     Shyanna Klingel P, PT Acute Rehabilitation Services Pager: [phone] Office: [place] 02/19/2019, 12:57 PM

## 2019-02-19 NOTE — Plan of Care (Signed)
  Problem: Education: Goal: Knowledge of General Education information will improve Description: Including pain rating scale, medication(s)/side effects and non-pharmacologic comfort measures Outcome: Adequate for Discharge   Problem: Health Behavior/Discharge Planning: Goal: Ability to manage health-related needs will improve Outcome: Adequate for Discharge   Problem: Clinical Measurements: Goal: Cardiovascular complication will be avoided Outcome: Adequate for Discharge   Problem: Pain Managment: Goal: General experience of comfort will improve Outcome: Adequate for Discharge

## 2019-02-20 DIAGNOSIS — I10 Essential (primary) hypertension: Secondary | ICD-10-CM | POA: Diagnosis not present

## 2019-02-20 DIAGNOSIS — I2510 Atherosclerotic heart disease of native coronary artery without angina pectoris: Secondary | ICD-10-CM | POA: Diagnosis not present

## 2019-02-20 DIAGNOSIS — Z9181 History of falling: Secondary | ICD-10-CM | POA: Diagnosis not present

## 2019-02-20 DIAGNOSIS — Z87891 Personal history of nicotine dependence: Secondary | ICD-10-CM | POA: Diagnosis not present

## 2019-02-20 DIAGNOSIS — T84032D Mechanical loosening of internal right knee prosthetic joint, subsequent encounter: Secondary | ICD-10-CM | POA: Diagnosis not present

## 2019-02-20 DIAGNOSIS — Z8711 Personal history of peptic ulcer disease: Secondary | ICD-10-CM | POA: Diagnosis not present

## 2019-02-20 DIAGNOSIS — Z96612 Presence of left artificial shoulder joint: Secondary | ICD-10-CM | POA: Diagnosis not present

## 2019-02-20 DIAGNOSIS — Z7901 Long term (current) use of anticoagulants: Secondary | ICD-10-CM | POA: Diagnosis not present

## 2019-02-20 DIAGNOSIS — K2270 Barrett's esophagus without dysplasia: Secondary | ICD-10-CM | POA: Diagnosis not present

## 2019-02-20 DIAGNOSIS — K5730 Diverticulosis of large intestine without perforation or abscess without bleeding: Secondary | ICD-10-CM | POA: Diagnosis not present

## 2019-02-20 DIAGNOSIS — E785 Hyperlipidemia, unspecified: Secondary | ICD-10-CM | POA: Diagnosis not present

## 2019-02-20 DIAGNOSIS — Z85820 Personal history of malignant melanoma of skin: Secondary | ICD-10-CM | POA: Diagnosis not present

## 2019-02-20 DIAGNOSIS — Z96611 Presence of right artificial shoulder joint: Secondary | ICD-10-CM | POA: Diagnosis not present

## 2019-02-20 DIAGNOSIS — Z981 Arthrodesis status: Secondary | ICD-10-CM | POA: Diagnosis not present

## 2019-02-20 DIAGNOSIS — K219 Gastro-esophageal reflux disease without esophagitis: Secondary | ICD-10-CM | POA: Diagnosis not present

## 2019-02-20 DIAGNOSIS — K449 Diaphragmatic hernia without obstruction or gangrene: Secondary | ICD-10-CM | POA: Diagnosis not present

## 2019-02-20 LAB — BPAM RBC
Blood Product Expiration Date: 202102232359
Blood Product Expiration Date: 202102242359
Blood Product Expiration Date: 202102242359
Unit Type and Rh: 600
Unit Type and Rh: 600
Unit Type and Rh: 600

## 2019-02-20 LAB — TYPE AND SCREEN
ABO/RH(D): A POS
Antibody Screen: POSITIVE
Donor AG Type: NEGATIVE
Donor AG Type: NEGATIVE
Unit division: 0
Unit division: 0
Unit division: 0

## 2019-02-23 LAB — AEROBIC/ANAEROBIC CULTURE W GRAM STAIN (SURGICAL/DEEP WOUND)
Culture: NO GROWTH
Gram Stain: NONE SEEN

## 2019-02-24 ENCOUNTER — Encounter: Payer: Self-pay | Admitting: Anesthesiology

## 2019-02-24 DIAGNOSIS — Z85820 Personal history of malignant melanoma of skin: Secondary | ICD-10-CM | POA: Diagnosis not present

## 2019-02-24 DIAGNOSIS — I2510 Atherosclerotic heart disease of native coronary artery without angina pectoris: Secondary | ICD-10-CM | POA: Diagnosis not present

## 2019-02-24 DIAGNOSIS — Z9181 History of falling: Secondary | ICD-10-CM | POA: Diagnosis not present

## 2019-02-24 DIAGNOSIS — Z981 Arthrodesis status: Secondary | ICD-10-CM | POA: Diagnosis not present

## 2019-02-24 DIAGNOSIS — Z96611 Presence of right artificial shoulder joint: Secondary | ICD-10-CM | POA: Diagnosis not present

## 2019-02-24 DIAGNOSIS — Z87891 Personal history of nicotine dependence: Secondary | ICD-10-CM | POA: Diagnosis not present

## 2019-02-24 DIAGNOSIS — K219 Gastro-esophageal reflux disease without esophagitis: Secondary | ICD-10-CM | POA: Diagnosis not present

## 2019-02-24 DIAGNOSIS — I10 Essential (primary) hypertension: Secondary | ICD-10-CM | POA: Diagnosis not present

## 2019-02-24 DIAGNOSIS — Z8711 Personal history of peptic ulcer disease: Secondary | ICD-10-CM | POA: Diagnosis not present

## 2019-02-24 DIAGNOSIS — T84032D Mechanical loosening of internal right knee prosthetic joint, subsequent encounter: Secondary | ICD-10-CM | POA: Diagnosis not present

## 2019-02-24 DIAGNOSIS — Z96612 Presence of left artificial shoulder joint: Secondary | ICD-10-CM | POA: Diagnosis not present

## 2019-02-24 DIAGNOSIS — K5730 Diverticulosis of large intestine without perforation or abscess without bleeding: Secondary | ICD-10-CM | POA: Diagnosis not present

## 2019-02-24 DIAGNOSIS — Z7901 Long term (current) use of anticoagulants: Secondary | ICD-10-CM | POA: Diagnosis not present

## 2019-02-24 DIAGNOSIS — K449 Diaphragmatic hernia without obstruction or gangrene: Secondary | ICD-10-CM | POA: Diagnosis not present

## 2019-02-24 DIAGNOSIS — K2270 Barrett's esophagus without dysplasia: Secondary | ICD-10-CM | POA: Diagnosis not present

## 2019-02-24 DIAGNOSIS — E785 Hyperlipidemia, unspecified: Secondary | ICD-10-CM | POA: Diagnosis not present

## 2019-02-24 MED ORDER — ROPIVACAINE HCL 7.5 MG/ML IJ SOLN
INTRAMUSCULAR | Status: DC | PRN
  Administered 2019-02-18: 20 mL via PERINEURAL

## 2019-02-24 MED ORDER — BUPIVACAINE IN DEXTROSE 0.75-8.25 % IT SOLN
INTRATHECAL | Status: DC | PRN
  Administered 2019-02-18: 1.8 mL via INTRATHECAL

## 2019-02-24 NOTE — Anesthesia Procedure Notes (Signed)
Anesthesia Regional Block: Adductor canal block   Pre-Anesthetic Checklist: ,, timeout performed, Correct Patient, Correct Site, Correct Laterality, Correct Procedure, Correct Position, site marked, Risks and benefits discussed,  Surgical consent,  Pre-op evaluation,  At surgeon's request and post-op pain management  Laterality: Right and Lower  Prep: chloraprep       Needles:  Injection technique: Single-shot     Needle Length: 9cm  Needle Gauge: 22     Additional Needles: Arrow StimuQuik ECHO Echogenic Stimulating PNB Needle  Procedures:,,,, ultrasound used (permanent image in chart),,,,  Narrative:  Start time: 02/18/2019 7:21 AM End time: 02/18/2019 7:26 AM Injection made incrementally with aspirations every 5 mL.  Performed by: Personally  Anesthesiologist: Oleta Mouse, MD

## 2019-02-24 NOTE — Anesthesia Procedure Notes (Signed)
Spinal  Patient location during procedure: OR Start time: 02/18/2019 7:32 AM End time: 02/18/2019 7:38 AM Preanesthetic Checklist Completed: patient identified, IV checked, site marked, risks and benefits discussed, surgical consent, monitors and equipment checked, pre-op evaluation and timeout performed Spinal Block Patient position: sitting Prep: DuraPrep Patient monitoring: heart rate, cardiac monitor, continuous pulse ox and blood pressure Approach: midline Location: L4-5 Injection technique: single-shot Needle Needle type: Sprotte  Needle gauge: 24 G Needle length: 9 cm Assessment Sensory level: T6

## 2019-02-24 NOTE — Anesthesia Postprocedure Evaluation (Signed)
Anesthesia Post Note  Patient: Gene Hill  Procedure(s) Performed: TOTAL KNEE REVISION (Right Knee)     Patient location during evaluation: PACU Anesthesia Type: MAC, Regional and Spinal Level of consciousness: awake and alert Pain management: pain level controlled Vital Signs Assessment: post-procedure vital signs reviewed and stable Respiratory status: spontaneous breathing, nonlabored ventilation, respiratory function stable and patient connected to nasal cannula oxygen Cardiovascular status: stable and blood pressure returned to baseline Postop Assessment: no apparent nausea or vomiting and spinal receding Anesthetic complications: no    Last Vitals:  Vitals:   02/19/19 0406 02/19/19 0818  BP: 124/74 131/79  Pulse: 75 93  Resp: 16 17  Temp: 37.1 C 36.8 C  SpO2: 96% 95%    Last Pain:  Vitals:   02/19/19 0849  TempSrc:   PainSc: 2                  Sabryna Lahm

## 2019-02-25 ENCOUNTER — Inpatient Hospital Stay: Payer: PPO

## 2019-02-25 ENCOUNTER — Inpatient Hospital Stay: Payer: PPO | Attending: Hematology & Oncology

## 2019-02-25 ENCOUNTER — Inpatient Hospital Stay (HOSPITAL_BASED_OUTPATIENT_CLINIC_OR_DEPARTMENT_OTHER): Payer: PPO | Admitting: Hematology & Oncology

## 2019-02-25 ENCOUNTER — Encounter: Payer: Self-pay | Admitting: Hematology & Oncology

## 2019-02-25 ENCOUNTER — Other Ambulatory Visit: Payer: Self-pay

## 2019-02-25 ENCOUNTER — Telehealth: Payer: Self-pay | Admitting: Hematology & Oncology

## 2019-02-25 VITALS — BP 135/59 | HR 79 | Temp 97.6°F | Resp 18 | Wt 209.0 lb

## 2019-02-25 DIAGNOSIS — M1711 Unilateral primary osteoarthritis, right knee: Secondary | ICD-10-CM

## 2019-02-25 DIAGNOSIS — Z5112 Encounter for antineoplastic immunotherapy: Secondary | ICD-10-CM | POA: Insufficient documentation

## 2019-02-25 DIAGNOSIS — Z79899 Other long term (current) drug therapy: Secondary | ICD-10-CM | POA: Insufficient documentation

## 2019-02-25 DIAGNOSIS — C9002 Multiple myeloma in relapse: Secondary | ICD-10-CM | POA: Insufficient documentation

## 2019-02-25 DIAGNOSIS — C9000 Multiple myeloma not having achieved remission: Secondary | ICD-10-CM

## 2019-02-25 DIAGNOSIS — Z95828 Presence of other vascular implants and grafts: Secondary | ICD-10-CM

## 2019-02-25 LAB — CBC WITH DIFFERENTIAL (CANCER CENTER ONLY)
Abs Immature Granulocytes: 0.03 10*3/uL (ref 0.00–0.07)
Basophils Absolute: 0 10*3/uL (ref 0.0–0.1)
Basophils Relative: 1 %
Eosinophils Absolute: 0 10*3/uL (ref 0.0–0.5)
Eosinophils Relative: 1 %
HCT: 30.4 % — ABNORMAL LOW (ref 39.0–52.0)
Hemoglobin: 9.6 g/dL — ABNORMAL LOW (ref 13.0–17.0)
Immature Granulocytes: 1 %
Lymphocytes Relative: 36 %
Lymphs Abs: 1.7 10*3/uL (ref 0.7–4.0)
MCH: 32.5 pg (ref 26.0–34.0)
MCHC: 31.6 g/dL (ref 30.0–36.0)
MCV: 103.1 fL — ABNORMAL HIGH (ref 80.0–100.0)
Monocytes Absolute: 0.6 10*3/uL (ref 0.1–1.0)
Monocytes Relative: 13 %
Neutro Abs: 2.2 10*3/uL (ref 1.7–7.7)
Neutrophils Relative %: 48 %
Platelet Count: 190 10*3/uL (ref 150–400)
RBC: 2.95 MIL/uL — ABNORMAL LOW (ref 4.22–5.81)
RDW: 14.8 % (ref 11.5–15.5)
WBC Count: 4.6 10*3/uL (ref 4.0–10.5)
nRBC: 0.4 % — ABNORMAL HIGH (ref 0.0–0.2)

## 2019-02-25 LAB — CMP (CANCER CENTER ONLY)
ALT: 15 U/L (ref 0–44)
AST: 12 U/L — ABNORMAL LOW (ref 15–41)
Albumin: 3.9 g/dL (ref 3.5–5.0)
Alkaline Phosphatase: 39 U/L (ref 38–126)
Anion gap: 8 (ref 5–15)
BUN: 15 mg/dL (ref 8–23)
CO2: 29 mmol/L (ref 22–32)
Calcium: 9.3 mg/dL (ref 8.9–10.3)
Chloride: 104 mmol/L (ref 98–111)
Creatinine: 0.98 mg/dL (ref 0.61–1.24)
GFR, Est AFR Am: >60 mL/min (ref >60)
GFR, Estimated: >60 mL/min (ref >60)
Glucose, Bld: 101 mg/dL — ABNORMAL HIGH (ref 70–99)
Potassium: 3.9 mmol/L (ref 3.5–5.1)
Sodium: 141 mmol/L (ref 135–145)
Total Bilirubin: 0.3 mg/dL (ref 0.3–1.2)
Total Protein: 6.9 g/dL (ref 6.5–8.1)

## 2019-02-25 LAB — RETICULOCYTES
Immature Retic Fract: 30 % — ABNORMAL HIGH (ref 2.3–15.9)
RBC.: 2.93 MIL/uL — ABNORMAL LOW (ref 4.22–5.81)
Retic Count, Absolute: 62.1 10*3/uL (ref 19.0–186.0)
Retic Ct Pct: 2.1 % (ref 0.4–3.1)

## 2019-02-25 LAB — LACTATE DEHYDROGENASE: LDH: 170 U/L (ref 98–192)

## 2019-02-25 MED ORDER — PROCHLORPERAZINE MALEATE 10 MG PO TABS
ORAL_TABLET | ORAL | Status: AC
  Filled 2019-02-25: qty 1

## 2019-02-25 MED ORDER — PROCHLORPERAZINE MALEATE 10 MG PO TABS
10.0000 mg | ORAL_TABLET | Freq: Once | ORAL | Status: AC
  Administered 2019-02-25: 10 mg via ORAL

## 2019-02-25 MED ORDER — ACETAMINOPHEN 325 MG PO TABS
ORAL_TABLET | ORAL | Status: AC
  Filled 2019-02-25: qty 2

## 2019-02-25 MED ORDER — SODIUM CHLORIDE 0.9% FLUSH
10.0000 mL | INTRAVENOUS | Status: DC | PRN
  Administered 2019-02-25: 10 mL via INTRAVENOUS
  Filled 2019-02-25: qty 10

## 2019-02-25 MED ORDER — ACETAMINOPHEN 325 MG PO TABS
650.0000 mg | ORAL_TABLET | Freq: Once | ORAL | Status: AC
  Administered 2019-02-25: 650 mg via ORAL

## 2019-02-25 MED ORDER — HEPARIN SOD (PORK) LOCK FLUSH 100 UNIT/ML IV SOLN
500.0000 [IU] | Freq: Once | INTRAVENOUS | Status: AC
  Administered 2019-02-25: 500 [IU] via INTRAVENOUS
  Filled 2019-02-25: qty 5

## 2019-02-25 MED ORDER — DIPHENHYDRAMINE HCL 25 MG PO CAPS
50.0000 mg | ORAL_CAPSULE | Freq: Once | ORAL | Status: AC
  Administered 2019-02-25: 50 mg via ORAL

## 2019-02-25 MED ORDER — DARATUMUMAB-HYALURONIDASE-FIHJ 1800-30000 MG-UT/15ML ~~LOC~~ SOLN
1800.0000 mg | Freq: Once | SUBCUTANEOUS | Status: AC
  Administered 2019-02-25: 1800 mg via SUBCUTANEOUS
  Filled 2019-02-25: qty 15

## 2019-02-25 MED ORDER — DEXAMETHASONE 4 MG PO TABS
20.0000 mg | ORAL_TABLET | Freq: Once | ORAL | Status: AC
  Administered 2019-02-25: 20 mg via ORAL

## 2019-02-25 MED ORDER — DIPHENHYDRAMINE HCL 25 MG PO CAPS
ORAL_CAPSULE | ORAL | Status: AC
  Filled 2019-02-25: qty 2

## 2019-02-25 NOTE — Addendum Note (Signed)
Addended by: Burney Gauze R on: 02/25/2019 12:20 PM   Modules accepted: Orders

## 2019-02-25 NOTE — Telephone Encounter (Signed)
Appointments were previously scheduled calendar printed per 2/3

## 2019-02-25 NOTE — Progress Notes (Signed)
Pt refused to stay for one hour post Darzalex Faspro.  Pt discharged to home via w/c with assist with no complaints.

## 2019-02-25 NOTE — Progress Notes (Signed)
Hematology and Oncology Follow Up Visit  Gene Hill [id number] 27-Jan-1940 79 y.o. 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  He just had the surgery for the right knee.  This was done over at I think a [facility].  Everything went well with the surgery.  There is no problem with bleeding.  There is no problem with infection.  He still has issues with the left shoulder.  He had left shoulder surgery a couple months ago.  Thankfully, his myeloma tests have gotten little bit better.  We last saw him in early January, [name] was down to 0.5 g/dL.  His IgA level was 1100 mg/dL.  The kappa light chain was 8.8 mg/dL.  He has had no problems with bowels or bladder.  There is no fever.  He has had no cough or shortness of breath.  There is been no chest wall pain.  He told me that when he got home from his surgery, he almost passed out.  He said he was upstairs.  He was in a separate bedroom.  When he got to the bedroom, he just felt very lightheaded.  He got weak in his legs.  He did not actually pass out but was just very lightheaded.  He had no palpitations.  He seemed to get over this.  Has had no further episodes.  Overall, his performance status is ECOG 1.     Medications:  Allergies as of 02/25/2019   No Known Allergies     Medication List       Accurate as of February 25, 2019 11:40 AM. If you have any questions, ask your nurse or doctor.        acetaminophen 500 MG tablet Commonly known as: TYLENOL Take 1,000 mg by mouth 3 (three) times daily as needed.   apixaban 2.5 MG Tabs tablet Commonly known as: Eliquis Take 1 tablet (2.5 mg total) by mouth 2 (two) times  daily.   atorvastatin 10 MG tablet Commonly known as: LIPITOR Take 1 tablet (10 mg total) by mouth at bedtime.   b complex vitamins tablet Take 1 tablet by mouth daily.   calcium carbonate 1500 (600 Ca) MG Tabs tablet Commonly known as: OSCAL Take 600 mg of elemental calcium by mouth daily with breakfast.   DARZALEX IV Inject into the vein every 28 (twenty-eight) days.   dexamethasone 2 MG tablet Commonly known as: DECADRON TAKE 2 TABLETS BY MOUTH DAY AFTER CHEMO THEN 1 TABLET EVERY DAY FOR 4 DAYS What changed:   how much to take  how to take this  when to take this  additional instructions   Fish Oil 1200 MG Caps Take 1,200 mg by mouth daily.   fluticasone 50 MCG/ACT nasal spray Commonly known as: FLONASE Place 1 spray into both nostrils at bedtime.   gabapentin 300 MG capsule Commonly known as: Neurontin Take 1 capsule (300 mg total) by mouth 3 (three) times daily for 14 days, THEN 1 capsule (300 mg total) 2 (two) times daily for 3 days, THEN 1 capsule (300 mg total) daily for 4 days.  Start taking on: February 18, 2019   methocarbamol 500 MG tablet Commonly known as: ROBAXIN Take 1 tablet (500 mg total) by mouth 3 (three) times daily as needed for muscle spasms.   metoprolol tartrate 100 MG tablet Commonly known as: LOPRESSOR Take 0.5 tablets (50 mg total) by mouth 2 (two) times daily.   multivitamin tablet Take 1 tablet by mouth daily.   mupirocin ointment 2 % Commonly known as: BACTROBAN APPLY 1 APPLICATION INTRA NASALLY TWICE DAILY 5 DAYS PRIOR TO SURGERY   oxybutynin 10 MG 24 hr tablet Commonly known as: DITROPAN-XL Take 10 mg by mouth daily.   oxyCODONE 5 MG immediate release tablet Commonly known as: Oxy IR/ROXICODONE Take 1 tablet (5 mg total) by mouth every 4 (four) hours as needed for severe pain (may need 1-2 tabs first couple weeks following surgery).   pantoprazole 40 MG tablet Commonly known as: PROTONIX Take 1 tablet (40 mg total) by  mouth at bedtime.   PROBIOTIC PO Take 1 tablet by mouth daily.   tamsulosin 0.4 MG Caps capsule Commonly known as: FLOMAX Take 0.4 mg by mouth daily.   VITAMIN B-12 PO Take 5,000 mcg by mouth daily.       Allergies: No Known Allergies  Past Medical History, Surgical history, Social history, and Family History were reviewed and updated.  Review of Systems: Review of Systems  Constitutional: Negative.   HENT: Negative.   Eyes: Negative.   Respiratory: Negative.   Cardiovascular: Negative.   Gastrointestinal: Negative.   Genitourinary: Negative.   Musculoskeletal: Positive for back pain and joint pain.  Skin: Negative.   Neurological: Negative.   Endo/Heme/Allergies: Negative.   Psychiatric/Behavioral: Negative.       Physical Exam:  weight is 209 lb (94.8 kg). His temporal temperature is 97.6 F (36.4 C). His blood pressure is 135/59 (abnormal) and his pulse is 79. His respiration is 18.   Wt Readings from Last 3 Encounters:  02/25/19 209 lb (94.8 kg)  02/18/19 209 lb (94.8 kg)  02/10/19 210 lb 1.6 oz (95.3 kg)  Is  Physical Exam Vitals reviewed.  HENT:     Head: Normocephalic and atraumatic.  Eyes:     Pupils: Pupils are equal, round, and reactive to light.  Cardiovascular:     Rate and Rhythm: Normal rate and regular rhythm.     Heart sounds: Normal heart sounds.  Pulmonary:     Effort: Pulmonary effort is normal.     Breath sounds: Normal breath sounds.  Abdominal:     General: Bowel sounds are normal.     Palpations: Abdomen is soft.  Musculoskeletal:        General: No tenderness or deformity. Normal range of motion.     Cervical back: Normal range of motion.     Comments: His extremities shows the surgical scar with a dressing on the right knee.  There is no swelling.  There is no obvious erythema.  Lymphadenopathy:     Cervical: No cervical adenopathy.  Skin:    General: Skin is warm and dry.     Findings: No erythema or rash.  Neurological:       Mental Status: He is alert and oriented to person, place, and time.  Psychiatric:        Behavior: Behavior normal.        Thought Content: Thought content normal.        Judgment: Judgment normal.      Lab Results  Component Value Date  WBC 4.6 02/25/2019   HGB 9.6 (L) 02/25/2019   HCT 30.4 (L) 02/25/2019   MCV 103.1 (H) 02/25/2019   PLT 190 02/25/2019   Lab Results  Component Value Date   FERRITIN 46 11/03/2018   IRON 76 11/03/2018   TIBC 318 11/03/2018   UIBC 241 11/03/2018   IRONPCTSAT 24 11/03/2018   Lab Results  Component Value Date   RETICCTPCT 2.1 02/25/2019   RBC 2.93 (L) 02/25/2019   RBC 2.95 (L) 02/25/2019   Lab Results  Component Value Date   KPAFRELGTCHN 88.1 (H) 01/28/2019   LAMBDASER 2.5 (L) 01/28/2019   KAPLAMBRATIO 35.24 (H) 01/28/2019   Lab Results  Component Value Date   IGGSERUM 109 (L) 01/28/2019   IGGSERUM 143 (L) 01/28/2019   IGA 1,069 (H) 01/28/2019   IGA 1,279 (H) 01/28/2019   IGMSERUM <5 (L) 01/28/2019   IGMSERUM <5 (L) 01/28/2019   Lab Results  Component Value Date   TOTALPROTELP 6.7 01/28/2019   ALBUMINELP 3.7 01/28/2019   A1GS 0.2 01/28/2019   A2GS 0.9 01/28/2019   BETS 1.7 (H) 01/28/2019   BETA2SER 2.0 (H) 12/08/2014   GAMS 0.2 (L) 01/28/2019   MSPIKE 0.5 (H) 01/28/2019   SPEI Comment 09/02/2017     Chemistry      Component Value Date/Time   NA 141 02/25/2019 1030   NA 141 01/21/2017 0741   NA 139 06/14/2016 1246   K 3.9 02/25/2019 1030   K 4.0 01/21/2017 0741   K 3.8 06/14/2016 1246   CL 104 02/25/2019 1030   CL 103 01/21/2017 0741   CO2 29 02/25/2019 1030   CO2 30 01/21/2017 0741   CO2 24 06/14/2016 1246   BUN 15 02/25/2019 1030   BUN 20 01/21/2017 0741   BUN 15.0 06/14/2016 1246   CREATININE 0.98 02/25/2019 1030   CREATININE 1.0 01/21/2017 0741   CREATININE 0.9 06/14/2016 1246      Component Value Date/Time   CALCIUM 9.3 02/25/2019 1030   CALCIUM 9.0 01/21/2017 0741   CALCIUM 9.6 06/14/2016 1246    ALKPHOS 39 02/25/2019 1030   ALKPHOS 37 01/21/2017 0741   ALKPHOS 54 06/14/2016 1246   AST 12 (L) 02/25/2019 1030   AST 25 06/14/2016 1246   ALT 15 02/25/2019 1030   ALT 29 01/21/2017 0741   ALT 21 06/14/2016 1246   BILITOT 0.3 02/25/2019 1030   BILITOT 0.37 06/14/2016 1246      Impression and Plan: Gene Hill is a very pleasant 79 yo caucasian gentleman with history of IgA kappa myeloma. He had high risk cytogenetics with a 17p- abnormality.   Would not surprise me if the myeloma by is a little bit higher because of his recent surgery.  Some of the M spike could be inflammatory.  His daratumumab is now every 4 weeks.  I think we have to get him back to every 4 weeks just for him the present time.  I just one make sure that we are not vigilant with respect to treating the myeloma.  The daratumumab has worked quite nicely for him.  He will get his Delton See today.  His next Xgeva dose will then be in May 2021  We will get him back in another 4 weeks.  Because of the surgery, had to spend little bit more time with him.  I spent about 30 minutes with him.  I went over all of his lab work.  I had to examine his knee.  We talked about his  myeloma protocol.  Answered his questions.       [name], MD 2/3/202111:40 AM

## 2019-02-26 LAB — IRON AND TIBC
Iron: 70 ug/dL (ref 42–163)
Saturation Ratios: 27 % (ref 20–55)
TIBC: 264 ug/dL (ref 202–409)
UIBC: 194 ug/dL (ref 117–376)

## 2019-02-26 LAB — IGG, IGA, IGM
IgA: 1318 mg/dL — ABNORMAL HIGH (ref 61–437)
IgG (Immunoglobin G), Serum: 125 mg/dL — ABNORMAL LOW (ref 603–1613)
IgM (Immunoglobulin M), Srm: <5 mg/dL — ABNORMAL LOW (ref 15–143)

## 2019-02-26 LAB — KAPPA/LAMBDA LIGHT CHAINS
Kappa free light chain: 99 mg/L — ABNORMAL HIGH (ref 3.3–19.4)
Kappa, lambda light chain ratio: 55 — ABNORMAL HIGH (ref 0.26–1.65)
Lambda free light chains: 1.8 mg/L — ABNORMAL LOW (ref 5.7–26.3)

## 2019-02-26 LAB — FERRITIN: Ferritin: 145 ng/mL (ref 24–336)

## 2019-03-02 ENCOUNTER — Telehealth (HOSPITAL_COMMUNITY): Payer: Self-pay | Admitting: Student-PharmD

## 2019-03-02 LAB — IMMUNOFIXATION REFLEX, SERUM
IgA: 1254 mg/dL — ABNORMAL HIGH (ref 61–437)
IgG (Immunoglobin G), Serum: 121 mg/dL — ABNORMAL LOW (ref 603–1613)
IgM (Immunoglobulin M), Srm: <5 mg/dL — ABNORMAL LOW (ref 15–143)

## 2019-03-02 LAB — PROTEIN ELECTROPHORESIS, SERUM, WITH REFLEX
A/G Ratio: 1 (ref 0.7–1.7)
Albumin ELP: 3.3 g/dL (ref 2.9–4.4)
Alpha-1-Globulin: 0.3 g/dL (ref 0.0–0.4)
Alpha-2-Globulin: 1.1 g/dL — ABNORMAL HIGH (ref 0.4–1.0)
Beta Globulin: 1.7 g/dL — ABNORMAL HIGH (ref 0.7–1.3)
Gamma Globulin: 0.3 g/dL — ABNORMAL LOW (ref 0.4–1.8)
Globulin, Total: 3.4 g/dL (ref 2.2–3.9)
M-Spike, %: 0.5 g/dL — ABNORMAL HIGH
SPEP Interpretation: 0
Total Protein ELP: 6.7 g/dL (ref 6.0–8.5)

## 2019-03-03 ENCOUNTER — Telehealth: Payer: Self-pay | Admitting: *Deleted

## 2019-03-03 NOTE — Telephone Encounter (Addendum)
-----   Message from [name], MD sent at 03/02/2019  4:38 PM EST ----- Called patient to let him know that Myeloma is stable at 0.5!!!  This will NEVER cause you to have a problem!

## 2019-03-09 DIAGNOSIS — T8489XD Other specified complication of internal orthopedic prosthetic devices, implants and grafts, subsequent encounter: Secondary | ICD-10-CM | POA: Diagnosis not present

## 2019-03-10 ENCOUNTER — Telehealth: Payer: Self-pay | Admitting: Internal Medicine

## 2019-03-10 NOTE — Chronic Care Management (AMB) (Signed)
  Chronic Care Management   Note  03/10/2019 Name: Gene Hill MRN: [id number] DOB: Mar 26, 1940  Gene Hill is a 79 y.o. year old male who is a primary care patient of Colon Branch, MD. I reached out to Gene Hill by phone today in response to a referral sent by Gene Hill's PCP, Colon Branch, MD. Wife, Gene Hill gave verbal consent. Wife  will be assisting with [name] phone visit.  Gene Hill was given information about Chronic Care Management services today including:  1. CCM service includes personalized support from designated clinical staff supervised by his physician, including individualized plan of care and coordination with other care providers 2. 24/7 contact phone numbers for assistance for urgent and routine care needs. 3. Service will only be billed when office clinical staff spend 20 minutes or more in a month to coordinate care. 4. Only one practitioner may furnish and bill the service in a calendar month. 5. The patient may stop CCM services at any time (effective at the end of the month) by phone call to the office staff. 6. The patient will be responsible for cost sharing (co-pay) of up to 20% of the service fee (after annual deductible is met).  Patient agreed to services and verbal consent obtained.   Follow up plan:   Raynicia Dukes [employer] Scheduler

## 2019-03-11 ENCOUNTER — Other Ambulatory Visit: Payer: PPO

## 2019-03-11 ENCOUNTER — Inpatient Hospital Stay: Payer: PPO

## 2019-03-11 ENCOUNTER — Ambulatory Visit: Payer: PPO | Admitting: Hematology & Oncology

## 2019-03-11 DIAGNOSIS — R262 Difficulty in walking, not elsewhere classified: Secondary | ICD-10-CM | POA: Diagnosis not present

## 2019-03-11 DIAGNOSIS — M25661 Stiffness of right knee, not elsewhere classified: Secondary | ICD-10-CM | POA: Diagnosis not present

## 2019-03-11 DIAGNOSIS — M6281 Muscle weakness (generalized): Secondary | ICD-10-CM | POA: Diagnosis not present

## 2019-03-11 DIAGNOSIS — M25561 Pain in right knee: Secondary | ICD-10-CM | POA: Diagnosis not present

## 2019-03-17 ENCOUNTER — Other Ambulatory Visit: Payer: Self-pay

## 2019-03-17 ENCOUNTER — Ambulatory Visit: Payer: PPO | Admitting: Pharmacist

## 2019-03-17 DIAGNOSIS — I10 Essential (primary) hypertension: Secondary | ICD-10-CM

## 2019-03-17 DIAGNOSIS — I2510 Atherosclerotic heart disease of native coronary artery without angina pectoris: Secondary | ICD-10-CM

## 2019-03-17 DIAGNOSIS — C9000 Multiple myeloma not having achieved remission: Secondary | ICD-10-CM

## 2019-03-17 DIAGNOSIS — E785 Hyperlipidemia, unspecified: Secondary | ICD-10-CM

## 2019-03-17 DIAGNOSIS — M1990 Unspecified osteoarthritis, unspecified site: Secondary | ICD-10-CM

## 2019-03-17 DIAGNOSIS — K2270 Barrett's esophagus without dysplasia: Secondary | ICD-10-CM

## 2019-03-17 NOTE — Chronic Care Management (AMB) (Signed)
Chronic Care Management Pharmacy  Name: MIRZA FESSEL  MRN: [id number] DOB: Oct 22, 1940   Chief Complaint/ HPI  Gene Hill,  79 y.o. , male presents for their Initial CCM visit with the clinical pharmacist via telephone due to COVID-19 Pandemic.  PCP : Colon Branch, MD  Their chronic conditions include: HTN, CAD, HLD, Barrett's Esophagus, Arthritis/Pain, BPH, Multiple Myeloma  Office Visits: 01/19/19: Office visit w/ Dr. Larose Kells for surgery clearance - No med changes noted. Clearance for surgery given.   Consult Visit: 02/25/19: Oncology office visit w/ Dr. Marin Olp - IgA kappy myeloma relasped. Past therapy = pomalyst stopped 12/25/16. Current therapy Daratumumab q4 week, Xgeva q3 months (next dose 05/2019). Post knee surgery. Issues with L shoulder. L should surgery few months prior. Myeloma slightly improved.  02/18/19: Total right knee revision w/ Dr. Ermalene Postin and French Ana  01/28/19: Oncology visit w/ Dr. Marin Olp -  IgA kappy myeloma relasped. Past therapy = pomalyst stopped 12/25/16. Current therapy Daratumumab q6 week, Xgeva q3 months (next dose 02/2019). Pt in need of knee surgery. Cleared for surgery. F/U in 4 weeks.  01/22/19: Cardio pre-op visit w/ Dr. Audie Box - EKG normal, revised cardiac risk index = 0, clearance for surgery given  12/22/18: Oncology visit w/ Dr. Marin Olp - No med changes noted  11/19/18: Left Reverse Shoulder arthroplasty w/ Dr. Griffin Basil  11/07/18: Urology office visit w/ Dr. Matilde Sprang  PT visits noted throughout   Medications: Outpatient Encounter Medications as of 03/17/2019  Medication Sig  . acetaminophen (TYLENOL) 500 MG tablet Take 1,000 mg by mouth 3 (three) times daily as needed.  Marland Kitchen apixaban (ELIQUIS) 2.5 MG TABS tablet Take 1 tablet (2.5 mg total) by mouth 2 (two) times daily.  Marland Kitchen atorvastatin (LIPITOR) 10 MG tablet Take 1 tablet (10 mg total) by mouth at bedtime.  Marland Kitchen b complex vitamins tablet Take 1 tablet by mouth daily.  . calcium carbonate (OSCAL)  1500 (600 Ca) MG TABS tablet Take 600 mg of elemental calcium by mouth daily with breakfast.   . Cyanocobalamin (VITAMIN B-12 PO) Take 5,000 mcg by mouth daily.   . Daratumumab (DARZALEX IV) Inject into the vein every 28 (twenty-eight) days.   Marland Kitchen dexamethasone (DECADRON) 2 MG tablet TAKE 2 TABLETS BY MOUTH Salah Nakamura AFTER CHEMO THEN 1 TABLET EVERY Corbitt Cloke FOR 4 DAYS (Patient taking differently: Take 2-4 mg by mouth See admin instructions. TAKE 4 MG BY MOUTH Lauramae Kneisley AFTER CHEMO THEN 2 MG EVERY Malyia Moro FOR 4 DAYS)  . methocarbamol (ROBAXIN) 500 MG tablet Take 1 tablet (500 mg total) by mouth 3 (three) times daily as needed for muscle spasms.  . metoprolol tartrate (LOPRESSOR) 100 MG tablet Take 0.5 tablets (50 mg total) by mouth 2 (two) times daily.  . Multiple Vitamin (MULTIVITAMIN) tablet Take 1 tablet by mouth daily.  . Omega-3 Fatty Acids (FISH OIL) 1200 MG CAPS Take 1,200 mg by mouth daily.  Marland Kitchen oxybutynin (DITROPAN-XL) 10 MG 24 hr tablet Take 10 mg by mouth daily.  Marland Kitchen oxyCODONE (OXY IR/ROXICODONE) 5 MG immediate release tablet Take 1 tablet (5 mg total) by mouth every 4 (four) hours as needed for severe pain (may need 1-2 tabs first couple weeks following surgery).  . pantoprazole (PROTONIX) 40 MG tablet Take 1 tablet (40 mg total) by mouth at bedtime.  . Probiotic Product (PROBIOTIC PO) Take 1 tablet by mouth daily.   . tamsulosin (FLOMAX) 0.4 MG CAPS capsule Take 0.4 mg by mouth daily.  . fluticasone (FLONASE) 50 MCG/ACT nasal spray  Place 1 spray into both nostrils at bedtime.   . gabapentin (NEURONTIN) 300 MG capsule Take 1 capsule (300 mg total) by mouth 3 (three) times daily for 14 days, THEN 1 capsule (300 mg total) 2 (two) times daily for 3 days, THEN 1 capsule (300 mg total) daily for 4 days.  . mupirocin ointment (BACTROBAN) 2 % APPLY 1 APPLICATION INTRA NASALLY TWICE DAILY 5 DAYS PRIOR TO SURGERY   Facility-Administered Encounter Medications as of 03/17/2019  Medication  . daratumumab (DARZALEX) 1,600 mg  in sodium chloride 0.9 % 420 mL (3.2 mg/mL) chemo infusion  . sodium chloride flush (NS) 0.9 % injection 10 mL  . sodium chloride flush (NS) 0.9 % injection 10 mL   Immunization History  Administered Date(s) Administered  . Influenza Split 11/13/2011  . Influenza Whole 01/20/2007, 01/04/2009  . Influenza, High Dose Seasonal PF 11/01/2016, 10/26/2018  . Influenza,inj,Quad PF,6+ Mos 10/10/2012, 10/28/2017  . Influenza-Unspecified 10/27/2014, 10/04/2015, 11/01/2016  . Pneumococcal Conjugate-13 02/05/2014  . Pneumococcal Polysaccharide-23 01/22/2006  . Td 01/23/2011    Current Diagnosis/Assessment:  Goals Addressed            This Visit's Progress   . A1c goal <6.5%      . Blood pressure goal <140/90      . Check blood pressure at least once per week (2-3 times perferably)      . Complete Shingrix vaccine series      . Consider Covid vaccine series      . If blood pressure remains at goal, pick up prescription for metoprolol 5m tablets       -This will be the same dose as taking half tablet of 1074m(5059m -This will prevent you from needing to cut tablets in half -Take one whole 34m78mblet twice daily to achieve same dose as half tablet of 100mg44mce daily    . Pharmacy Care Plan       Current Barriers:  . Chronic Disease Management support, education, and care coordination needs related to HTN, CAD, HLD, Barrett's Esophagus, Arthritis/Pain, BPH, Multiple Myeloma  Pharmacist Clinical Goal(s):  . Blood pressure goal <140/90 . A1c goal <6.5%  Interventions: . Comprehensive medication review performed. . Simplify medication regimen by prescribe metoprolol 34mg 11mtwice daily versus 1/2 tablet of metoprolol 100mg t22m daily . Check blood pressure at least weekly, preferably 2 to 3 times per week . Complete Shingrix vaccine series . Consider Covid vaccine series  Patient Self Care Activities:  . Patient verbalizes understanding of plan to follow as described above,  Self administers medications as prescribed, Calls pharmacy for medication refills, and Calls provider office for new concerns or questions  Initial goal documentation     . Practice moderation with carbohydrates        Social Hx: [employer] (a favorite TV show of his; a [place] from [place] show) Married for 62 year65to Marie July 25th. 4 kids. No grandchildren.  Hypertension   CMP Latest Ref Rng & Units 02/25/2019 02/19/2019 02/10/2019  Glucose 70 - 99 mg/dL 101(H) 106(H) 107(H)  BUN 8 - 23 mg/dL 15 11 16   Creatinine 0.61 - 1.24 mg/dL 0.98 0.89 0.90  Sodium 135 - 145 mmol/L 141 140 140  Potassium 3.5 - 5.1 mmol/L 3.9 4.3 3.9  Chloride 98 - 111 mmol/L 104 109 106  CO2 22 - 32 mmol/L 29 26 24   Calcium 8.9 - 10.3 mg/dL 9.3 8.3(L) 9.2  Total Protein 6.5 - 8.1 g/dL 6.9 -  6.8  Total Bilirubin 0.3 - 1.2 mg/dL 0.3 - 0.4  Alkaline Phos 38 - 126 U/L 39 - 40  AST 15 - 41 U/L 12(L) - 19  ALT 0 - 44 U/L 15 - 21  GFR      >60   >60   >60  BP today is:  Pt reported BP = 173/84 (denies dizziness, lightheadedness, has pain in knee today)   Office blood pressures are  BP Readings from Last 3 Encounters:  02/25/19 (!) 135/59  02/19/19 131/79  02/10/19 (!) 157/82    Patient has failed these meds in the past: None noted   Patient is currently controlled on the following medications: metoprolol tartrate 185m 1/2 tab BID  Patient checks BP at home weekly on Thursdays  Patient home BP readings are ranging: 120s/70s (has decreased since his knee surgery per patient)  We discussed changing metoprolol to 534mwhole tablets vs 1/2 tablet of 10069mdue for refill). Proper BP technique  Plan -Will have patient check BP daily for next few days to make sure BP is back WNL before sending in 60m53mb BID -Pick up new prescription for metoprolol 60mg81m to replace metoprolol 100mg 66mtab BID (same dose) -Continue checking BP at least once weekly (2-3 times per week preferably)  CAD    Patient has failed these meds in past: None noted Patient is currently controlled on the following medications: atorvastatin 10mg d59m, Aspirin 325mg da43mafter completing eliquis  About 20+ years ago he stood up and got very weak. Was taken to the hospital was told he has defect in one of his veins (dip in vein?) he got cathed, and was recommended that he take 325mg asp90m daily. "Caught it before it turned into heart attack"  Cardiologist said patient can follow up when needed. Him and his wife are very active. Most recent EKG WNL.   We discussed:    Plan -Continue current medications   Hyperlipidemia   Lipid Panel     Component Value Date/Time   CHOL 171 05/12/2018 0747   TRIG 172.0 (H) 05/12/2018 0747   TRIG 129 01/28/2006 0900   HDL 46.50 05/12/2018 0747   CHOLHDL 4 05/12/2018 0747   VLDL 34.4 05/12/2018 0747   LDLCALC 90 05/12/2018 0747   LDLDIRECT 156.0 03/27/2018 0846     ASCVD 10-year risk: Hx of ASCVD  LDL Goal <70, but could tolerate looser goal (<100) considering pt comorbidities  Patient has failed these meds in past: None noted Patient is currently uncontrolled for LDL on the following medications: atorvastatin 10mg dail5mmega 3 1200mg daily85m discussed:  diet and exercise extensively  Plan -Continue current medications and control with diet and exercise  Barrett's Esophagus   Patient has failed these meds in past: None noted Patient is currently controlled on the following medications: pantoprazole 40mg HS  Br59mhrough Symptoms: once or twice a month Breakthrough Tx: Rolaids Trigger: Spicy foods  We discussed:  avoiding triggers  Plan -Continue current medications   BPH/Urinary Incontinence    Patient has failed these meds in past: none noted Patient is currently controlled on the following medications: tamsulosin 0.4 mg daily, oxybutynin 24 hr 10mg daily  76mg back to urology on 04/09/19 He thinks the oxybutynin is working  ok  Overnight Urination Trips: 2-3 Trouble Starting Stream: occasionally  Trouble Emptying Bladder: occasionally (but pt reports that ultrasounds have showed that he is not emptying his bladder)   Plan -Continue current medications  Arthritis/Pain    Patient has failed these meds in past: None noted Patient is currently controlled on the following medications: acetaminophen 1071m TID PRN, methocarbamol 5024mTID PRN, oxycodone 30m66m4 hrs PRN severe pain  Uses oxycodone about twice a week Prescribed by Dr. EnnMarin Olpsual Pain Level: 1 Pain after taking pain med: 1 Worse Pain: 6-7  Plan -Continue current medications   Multiple Myeloma   Follow by Oncology with Dr. EnnMarin Olptient is currently stable on the following medications: daratumumab q4weeks, dexamethasone 2mg17mtabs daily after chemo then 1 tab daily for 4 days  Plan -Continue current medications   Allergic Rhinitis    Patient has failed these meds in past: None noted Patient is currently controlled on the following medications: fluticasone 50mc11mspray EN HS (not currently taking)  Not taking anymore. Doesn't feel like it helped. Hasn't taken since knee surgery  Plan -Continue with current management   Pre-Diabetes   Recent Relevant Labs: Lab Results  Component Value Date/Time   HGBA1C 6.0 03/27/2018 08:46 AM   HGBA1C 5.6 04/01/2017 08:24 AM   EGFR 81 (L) 06/14/2016 12:46 PM   EGFR 76 (L) 03/30/2016 09:37 AM   We discussed: diet and exercise extensively. How long term dexamethasone can increase a1c.  Plan -Continue control with diet and exercise   Vaccines   We discussed:  Shingrix and Covid vaccines  Plan -Complete shingles vaccine series -Consider Covid vaccine series   Miscellaneous Meds B12 5000mcg70mly B complex Multi vitamin Calcium carbonate/D3 600mg/860mnits Eliquis post knee surgery 5 days to 1 week remaining

## 2019-03-17 NOTE — Patient Instructions (Addendum)
Visit Information  Goals Addressed            This Visit's Progress   . A1c goal <6.5%      . Blood pressure goal <140/90      . Check blood pressure at least once per week (2-3 times perferably)      . Complete Shingrix vaccine series      . Consider Covid vaccine series      . If blood pressure remains at goal, pick up prescription for metoprolol 74m tablets       -This will be the same dose as taking half tablet of 1073m(5039m -This will prevent you from needing to cut tablets in half -Take one whole 90m88mblet twice daily to achieve same dose as half tablet of 100mg1mce daily    . Pharmacy Care Plan       Current Barriers:  . Chronic Disease Management support, education, and care coordination needs related to HTN, CAD, HLD, Barrett's Esophagus, Arthritis/Pain, BPH, Multiple Myeloma  Pharmacist Clinical Goal(s):  . Blood pressure goal <140/90 . A1c goal <6.5%  Interventions: . Comprehensive medication review performed. . Simplify medication regimen by prescribe metoprolol 90mg 72mtwice daily versus 1/2 tablet of metoprolol 100mg t64m daily . Check blood pressure at least weekly, preferably 2 to 3 times per week . Complete Shingrix vaccine series . Consider Covid vaccine series  Patient Self Care Activities:  . Patient verbalizes understanding of plan to follow as described above, Self administers medications as prescribed, Calls pharmacy for medication refills, and Calls provider office for new concerns or questions  Initial goal documentation     . Practice moderation with carbohydrates         Gene Hill was given information about Chronic Care Management services today including:  1. CCM service includes personalized support from designated clinical staff supervised by his physician, including individualized plan of care and coordination with other care providers 2. 24/7 contact phone numbers for assistance for urgent and routine care needs. 3. Service  will only be billed when office clinical staff spend 20 minutes or more in a month to coordinate care. 4. Only one practitioner may furnish and bill the service in a calendar month. 5. The patient may stop CCM services at any time (effective at the end of the month) by phone call to the office staff. 6. The patient will be responsible for cost sharing (co-pay) of up to 20% of the service fee (after annual deductible is met).  Patient agreed to services and verbal consent obtained.   The patient verbalized understanding of instructions provided today and agreed to receive a mailed copy of patient instruction and/or educational materials. Telephone follow up appointment with pharmacy team member scheduled for: 09/15/19  KaneshaMelvenia BeamharmD Clinical Pharmacist [phone] Pressure Record Sheet To take your blood pressure, you will need a blood pressure machine. You can buy a blood pressure machine (blood pressure monitor) at your clinic, drug store, or online. When choosing one, consider:  An automatic monitor that has an arm cuff.  A cuff that wraps snugly around your upper arm. You should be able to fit only one finger between your arm and the cuff.  A device that stores blood pressure reading results.  Do not choose a monitor that measures your blood pressure from your wrist or finger. Follow your health care provider's instructions for how to take your blood pressure. To  use this form:  Get one reading in the morning (a.m.) before you take any medicines.  Get one reading in the evening (p.m.) before supper.  Take at least 2 readings with each blood pressure check. This makes sure the results are correct. Wait 1-2 minutes between measurements.  Write down the results in the spaces on this form.  Repeat this once a week, or as told by your health care provider.  Make a follow-up appointment with your health care provider to  discuss the results. Blood pressure log Date: _______________________  a.m. _____________________(1st reading) _____________________(2nd reading)  p.m. _____________________(1st reading) _____________________(2nd reading) Date: _______________________  a.m. _____________________(1st reading) _____________________(2nd reading)  p.m. _____________________(1st reading) _____________________(2nd reading) Date: _______________________  a.m. _____________________(1st reading) _____________________(2nd reading)  p.m. _____________________(1st reading) _____________________(2nd reading) Date: _______________________  a.m. _____________________(1st reading) _____________________(2nd reading)  p.m. _____________________(1st reading) _____________________(2nd reading) Date: _______________________  a.m. _____________________(1st reading) _____________________(2nd reading)  p.m. _____________________(1st reading) _____________________(2nd reading) This information is not intended to replace advice given to you by your health care provider. Make sure you discuss any questions you have with your health care provider. Document Revised: 03/08/2017 Document Reviewed: 01/08/2017 Elsevier Patient Education  [place].   How to Take Your Blood Pressure You can take your blood pressure at home with a machine. You may need to check your blood pressure at home:  To check if you have high blood pressure (hypertension).  To check your blood pressure over time.  To make sure your blood pressure medicine is working. Supplies needed: You will need a blood pressure machine, or monitor. You can buy one at a drugstore or online. When choosing one:  Choose one with an arm cuff.  Choose one that wraps around your upper arm. Only one finger should fit between your arm and the cuff.  Do not choose one that measures your blood pressure from your wrist or finger. Your doctor can suggest a  monitor. How to prepare Avoid these things for 30 minutes before checking your blood pressure:  Drinking caffeine.  Drinking alcohol.  Eating.  Smoking.  Exercising. Five minutes before checking your blood pressure:  Pee.  Sit in a dining chair. Avoid sitting in a soft couch or armchair.  Be quiet. Do not talk. How to take your blood pressure Follow the instructions that came with your machine. If you have a digital blood pressure monitor, these may be the instructions: 1. Sit up straight. 2. Place your feet on the floor. Do not cross your ankles or legs. 3. Rest your left arm at the level of your heart. You may rest it on a table, desk, or chair. 4. Pull up your shirt sleeve. 5. Wrap the blood pressure cuff around the upper part of your left arm. The cuff should be 1 inch (2.5 cm) above your elbow. It is best to wrap the cuff around bare skin. 6. Fit the cuff snugly around your arm. You should be able to place only one finger between the cuff and your arm. 7. Put the cord inside the groove of your elbow. 8. Press the power button. 9. Sit quietly while the cuff fills with air and loses air. 10. Write down the numbers on the screen. 11. Wait 2-3 minutes and then repeat steps 1-10. What do the numbers mean? Two numbers make up your blood pressure. The first number is called systolic pressure. The second is called diastolic pressure. An example of a blood pressure reading is "120 over 80" (or 120/80).  If you are an adult and do not have a medical condition, use this guide to find out if your blood pressure is normal: Normal  First number: below 120.  Second number: below 80. Elevated  First number: 120-129.  Second number: below 80. Hypertension stage 1  First number: 130-139.  Second number: 80-89. Hypertension stage 2  First number: 140 or above.  Second number: 22 or above. Your blood pressure is above normal even if only the top or bottom number is above  normal. Follow these instructions at home:  Check your blood pressure as often as your doctor tells you to.  Take your monitor to your next doctor's appointment. Your doctor will: ? Make sure you are using it correctly. ? Make sure it is working right.  Make sure you understand what your blood pressure numbers should be.  Tell your doctor if your medicines are causing side effects. Contact a doctor if:  Your blood pressure keeps being high. Get help right away if:  Your first blood pressure number is higher than 180.  Your second blood pressure number is higher than 120. This information is not intended to replace advice given to you by your health care provider. Make sure you discuss any questions you have with your health care provider. Document Revised: 12/21/2016 Document Reviewed: 06/17/2015 Elsevier Patient Education  2020 [employer].

## 2019-03-18 NOTE — Progress Notes (Signed)
  I have reviewed this encounter including the documentation in this note and/or discussed this patient with the care management provider. I am certifying that I agree with the content of this note as supervising physician.  JP 03/18/2019

## 2019-03-20 ENCOUNTER — Telehealth: Payer: Self-pay | Admitting: Pharmacist

## 2019-03-20 NOTE — Telephone Encounter (Signed)
Called patient to follow up on his BP after his appt with me on on 03/17/19 where his BP was 173/84.  Below are the BP readings the patient recorded.  03/17/19 - 142/80 (PM) 03/18/19 - 127/65 (AM), 127/66 ([facility]), 113/67 (PM) 03/19/19 - 120/65 (AM), 134/62 (PM) 03/20/19 - 136/75 (AM), 166/93 ([facility]), 124/69 (PM)  Average of all readings = 132/71 (<140/90) = at goal  With overall BP at goal, Dr. Larose Kells is agreeable to sending in metoprolol tartrate 50mg  BID for patient vs metoprolol 100mg  1/2 tab BID to help simplify patient's medication regimen.

## 2019-03-22 NOTE — Telephone Encounter (Signed)
I agree, please send metoprolol tartrate 50 mg 1 twice daily: 1 year supply

## 2019-03-23 MED ORDER — METOPROLOL TARTRATE 50 MG PO TABS: 50.0000 mg | ORAL_TABLET | Freq: Two times a day (BID) | ORAL | 3 refills | Status: AC

## 2019-03-23 NOTE — Telephone Encounter (Signed)
Rx sent 

## 2019-03-23 NOTE — Addendum Note (Signed)
Addended byDamita Dunnings D on: 03/23/2019 08:27 AM   Modules accepted: Orders

## 2019-03-25 DIAGNOSIS — R262 Difficulty in walking, not elsewhere classified: Secondary | ICD-10-CM | POA: Diagnosis not present

## 2019-03-25 DIAGNOSIS — T8489XD Other specified complication of internal orthopedic prosthetic devices, implants and grafts, subsequent encounter: Secondary | ICD-10-CM | POA: Diagnosis not present

## 2019-03-25 DIAGNOSIS — M25661 Stiffness of right knee, not elsewhere classified: Secondary | ICD-10-CM | POA: Diagnosis not present

## 2019-03-25 DIAGNOSIS — M6281 Muscle weakness (generalized): Secondary | ICD-10-CM | POA: Diagnosis not present

## 2019-03-26 ENCOUNTER — Encounter: Payer: Self-pay | Admitting: Hematology & Oncology

## 2019-03-26 ENCOUNTER — Inpatient Hospital Stay: Payer: PPO

## 2019-03-26 ENCOUNTER — Inpatient Hospital Stay: Payer: PPO | Attending: Hematology & Oncology

## 2019-03-26 ENCOUNTER — Inpatient Hospital Stay (HOSPITAL_BASED_OUTPATIENT_CLINIC_OR_DEPARTMENT_OTHER): Payer: PPO | Admitting: Hematology & Oncology

## 2019-03-26 ENCOUNTER — Other Ambulatory Visit: Payer: Self-pay

## 2019-03-26 VITALS — BP 143/78 | HR 75 | Temp 97.1°F | Resp 18 | Wt 209.0 lb

## 2019-03-26 DIAGNOSIS — C9000 Multiple myeloma not having achieved remission: Secondary | ICD-10-CM

## 2019-03-26 DIAGNOSIS — Z5112 Encounter for antineoplastic immunotherapy: Secondary | ICD-10-CM | POA: Insufficient documentation

## 2019-03-26 DIAGNOSIS — C9002 Multiple myeloma in relapse: Secondary | ICD-10-CM | POA: Diagnosis not present

## 2019-03-26 DIAGNOSIS — Z7901 Long term (current) use of anticoagulants: Secondary | ICD-10-CM | POA: Insufficient documentation

## 2019-03-26 DIAGNOSIS — Z79899 Other long term (current) drug therapy: Secondary | ICD-10-CM | POA: Insufficient documentation

## 2019-03-26 DIAGNOSIS — M546 Pain in thoracic spine: Secondary | ICD-10-CM

## 2019-03-26 DIAGNOSIS — M1711 Unilateral primary osteoarthritis, right knee: Secondary | ICD-10-CM

## 2019-03-26 DIAGNOSIS — Z9481 Bone marrow transplant status: Secondary | ICD-10-CM

## 2019-03-26 LAB — IRON AND TIBC
Iron: 82 ug/dL (ref 42–163)
Saturation Ratios: 26 % (ref 20–55)
TIBC: 320 ug/dL (ref 202–409)
UIBC: 238 ug/dL (ref 117–376)

## 2019-03-26 LAB — CMP (CANCER CENTER ONLY)
ALT: 11 U/L (ref 0–44)
AST: 14 U/L — ABNORMAL LOW (ref 15–41)
Albumin: 4.4 g/dL (ref 3.5–5.0)
Alkaline Phosphatase: 34 U/L — ABNORMAL LOW (ref 38–126)
Anion gap: 9 (ref 5–15)
BUN: 13 mg/dL (ref 8–23)
CO2: 28 mmol/L (ref 22–32)
Calcium: 9.8 mg/dL (ref 8.9–10.3)
Chloride: 103 mmol/L (ref 98–111)
Creatinine: 0.92 mg/dL (ref 0.61–1.24)
GFR, Est AFR Am: >60 mL/min (ref >60)
GFR, Estimated: >60 mL/min (ref >60)
Glucose, Bld: 106 mg/dL — ABNORMAL HIGH (ref 70–99)
Potassium: 4.1 mmol/L (ref 3.5–5.1)
Sodium: 140 mmol/L (ref 135–145)
Total Bilirubin: 0.3 mg/dL (ref 0.3–1.2)
Total Protein: 7.1 g/dL (ref 6.5–8.1)

## 2019-03-26 LAB — CBC WITH DIFFERENTIAL (CANCER CENTER ONLY)
Abs Immature Granulocytes: 0.02 10*3/uL (ref 0.00–0.07)
Basophils Absolute: 0 10*3/uL (ref 0.0–0.1)
Basophils Relative: 1 %
Eosinophils Absolute: 0 10*3/uL (ref 0.0–0.5)
Eosinophils Relative: 1 %
HCT: 33.9 % — ABNORMAL LOW (ref 39.0–52.0)
Hemoglobin: 10.8 g/dL — ABNORMAL LOW (ref 13.0–17.0)
Immature Granulocytes: 1 %
Lymphocytes Relative: 41 %
Lymphs Abs: 1.6 10*3/uL (ref 0.7–4.0)
MCH: 32.8 pg (ref 26.0–34.0)
MCHC: 31.9 g/dL (ref 30.0–36.0)
MCV: 103 fL — ABNORMAL HIGH (ref 80.0–100.0)
Monocytes Absolute: 0.5 10*3/uL (ref 0.1–1.0)
Monocytes Relative: 14 %
Neutro Abs: 1.7 10*3/uL (ref 1.7–7.7)
Neutrophils Relative %: 42 %
Platelet Count: 144 10*3/uL — ABNORMAL LOW (ref 150–400)
RBC: 3.29 MIL/uL — ABNORMAL LOW (ref 4.22–5.81)
RDW: 15.3 % (ref 11.5–15.5)
WBC Count: 3.8 10*3/uL — ABNORMAL LOW (ref 4.0–10.5)
nRBC: 0 % (ref 0.0–0.2)

## 2019-03-26 LAB — FERRITIN: Ferritin: 69 ng/mL (ref 24–336)

## 2019-03-26 LAB — LACTATE DEHYDROGENASE: LDH: 178 U/L (ref 98–192)

## 2019-03-26 MED ORDER — ACETAMINOPHEN 325 MG PO TABS
ORAL_TABLET | ORAL | Status: AC
  Filled 2019-03-26: qty 2

## 2019-03-26 MED ORDER — PROCHLORPERAZINE MALEATE 10 MG PO TABS
10.0000 mg | ORAL_TABLET | Freq: Once | ORAL | Status: AC
  Administered 2019-03-26: 11:00:00 10 mg via ORAL

## 2019-03-26 MED ORDER — PROCHLORPERAZINE MALEATE 10 MG PO TABS
ORAL_TABLET | ORAL | Status: AC
  Filled 2019-03-26: qty 1

## 2019-03-26 MED ORDER — HEPARIN SOD (PORK) LOCK FLUSH 100 UNIT/ML IV SOLN
500.0000 [IU] | Freq: Once | INTRAVENOUS | Status: DC
  Filled 2019-03-26: qty 5

## 2019-03-26 MED ORDER — DENOSUMAB 120 MG/1.7ML ~~LOC~~ SOLN
120.0000 mg | Freq: Once | SUBCUTANEOUS | Status: AC
  Administered 2019-03-26: 120 mg via SUBCUTANEOUS

## 2019-03-26 MED ORDER — SODIUM CHLORIDE 0.9% FLUSH
10.0000 mL | INTRAVENOUS | Status: DC | PRN
  Filled 2019-03-26: qty 10

## 2019-03-26 MED ORDER — DEXAMETHASONE 4 MG PO TABS
20.0000 mg | ORAL_TABLET | Freq: Once | ORAL | Status: AC
  Administered 2019-03-26: 20 mg via ORAL

## 2019-03-26 MED ORDER — DARATUMUMAB-HYALURONIDASE-FIHJ 1800-30000 MG-UT/15ML ~~LOC~~ SOLN
1800.0000 mg | Freq: Once | SUBCUTANEOUS | Status: AC
  Administered 2019-03-26: 1800 mg via SUBCUTANEOUS
  Filled 2019-03-26: qty 15

## 2019-03-26 MED ORDER — ACETAMINOPHEN 325 MG PO TABS
650.0000 mg | ORAL_TABLET | Freq: Once | ORAL | Status: AC
  Administered 2019-03-26: 650 mg via ORAL

## 2019-03-26 MED ORDER — HEPARIN SOD (PORK) LOCK FLUSH 100 UNIT/ML IV SOLN
500.0000 [IU] | Freq: Once | INTRAVENOUS | Status: DC | PRN
  Filled 2019-03-26: qty 5

## 2019-03-26 MED ORDER — TRAMADOL HCL 50 MG PO TABS: 50.0000 mg | ORAL_TABLET | Freq: Four times a day (QID) | ORAL | 0 refills | Status: DC | PRN

## 2019-03-26 MED ORDER — DIPHENHYDRAMINE HCL 25 MG PO CAPS
50.0000 mg | ORAL_CAPSULE | Freq: Once | ORAL | Status: AC
  Administered 2019-03-26: 50 mg via ORAL

## 2019-03-26 MED ORDER — DIPHENHYDRAMINE HCL 25 MG PO CAPS
ORAL_CAPSULE | ORAL | Status: AC
  Filled 2019-03-26: qty 2

## 2019-03-26 MED ORDER — SODIUM CHLORIDE 0.9 % IV SOLN
Freq: Once | INTRAVENOUS | Status: DC
  Filled 2019-03-26: qty 250

## 2019-03-26 MED ORDER — DENOSUMAB 120 MG/1.7ML ~~LOC~~ SOLN
SUBCUTANEOUS | Status: AC
  Filled 2019-03-26: qty 1.7

## 2019-03-26 NOTE — Progress Notes (Signed)
Nurse connected with patient 03/27/19 at  2:30 PM EST by a telephone enabled telemedicine application and verified that I am speaking with the correct person using two identifiers. Patient stated full name and DOB. Patient gave permission to continue with virtual visit. Patient's location was at home and Nurse's location was at [place] office.  Subjective:   Gene Hill is a 79 y.o. male who presents for Medicare Annual/Subsequent preventive examination.  Reads daily.  Review of Systems No ROS.  Medicare Wellness Virtual Visit.  Visual/audio telehealth visit, UTA vital signs.   See social history for additional risk factors.  Home Safety/Smoke Alarms: Feels safe in home. Smoke alarms in place.  Lives in 2 story home w/ wife and son. Uses cane. Does well with stairs.  Male:     PSA-  Lab Results  Component Value Date   PSA 1.03 08/23/2011   PSA 0.96 04/06/2009   PSA 0.94 07/07/2007       Objective:    Vitals: BP 127/63 (BP Location: Right Arm) Comment: pt reported    Advanced Directives 03/27/2019 03/26/2019 02/25/2019 02/18/2019 02/10/2019 01/28/2019 12/22/2018  Does Patient Have a Medical Advance Directive? _0  Yes Yes  Type of Paramedic of [place];Living will [place];Living will [place];Living will [place];Living will [place];Living will [place];Living will [place];Living will  Does patient want to make changes to medical advance directive? No - Patient declined No - Patient declined No - Patient declined No - Patient declined - No - Patient declined No - Patient declined  Copy of [place] in Chart? Yes - validated most recent copy scanned in chart (See row information) - - Yes - validated most recent copy scanned in chart (See row information) Yes - validated most recent copy scanned in chart (See  row information) - -  Would patient like information on creating a medical advance directive? - - - - - - -    Tobacco Social History   Tobacco Use  Smoking Status Former Smoker  . Packs/day: 0.25  . Years: 20.00  . Pack years: 5.00  . Types: Cigarettes  . Start date: 04/28/1954  . Quit date: 11/28/1974  . Years since quitting: 44.3  Smokeless Tobacco Never Used  Tobacco Comment   quit 35 years ago, 1976     Counseling given: Not Answered Comment: quit 35 years ago, 1976   Clinical Intake: Pain : No/denies pain      Past Medical History:  Diagnosis Date  . Arthritis   . 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 Blood dyscrasia    low WBC d/t multiple myeloma  . Bulging lumbar disc    L2-3  . Carotid bruit    3-11 carotid u/s was  (-)  . 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 GERD (gastroesophageal reflux disease)   . Headache   . Heart murmur    doesn't have one  . History of hiatal hernia   . HYPERLIPIDEMIA 05/03/2006  . HYPERTENSION 05/03/2006  . 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 Torn rotator cuff 01/22/2017   left- [place]   Past Surgical History:  Procedure Laterality Date  .  BACK SURGERY    . CERVICAL FUSION    . IR FLUORO GUIDE PORT INSERTION RIGHT  11/29/2016  . IR US GUIDE VASC ACCESS RIGHT  11/29/2016  . [place]   right  . REVERSE SHOULDER ARTHROPLASTY Right 06/25/2018   Procedure: REVERSE SHOULDER ARTHROPLASTY;  Surgeon: Hiram Gash, MD;  Location: [facility];  Service: Orthopedics;  Laterality: Right;  . REVERSE SHOULDER ARTHROPLASTY Left 11/19/2018   Procedure: REVERSE SHOULDER ARTHROPLASTY;  Surgeon: Hiram Gash, MD;  Location: [facility];  Service: Orthopedics;  Laterality: Left;  . ROTATOR CUFF REPAIR     right  . SPINAL  FUSION     lumbar  . TOTAL KNEE ARTHROPLASTY Right 12/23/2015   Procedure: TOTAL KNEE ARTHROPLASTY;  Surgeon: Earlie Server, MD;  Location: [facility];  Service: Orthopedics;  Laterality: Right;  . TOTAL KNEE REVISION Right 02/18/2019  . TOTAL KNEE REVISION Right 02/18/2019   Procedure: TOTAL KNEE REVISION;  Surgeon: Earlie Server, MD;  Location: [facility];  Service: Orthopedics;  Laterality: Right;   Family History  Problem Relation Age of Onset  . Kidney cancer Father   . Leukemia Mother   . Pulmonary embolism Brother   . Stroke Maternal Grandmother   . Heart attack Maternal Grandfather   . Stroke Paternal Grandmother   . Cerebral palsy Son   . Prostate cancer Neg Hx   . Colon cancer Neg Hx   . Esophageal cancer Neg Hx   . Rectal cancer Neg Hx   . Stomach cancer Neg Hx    Social History   Socioeconomic History  . Marital status: Married    Spouse name: Not on file  . Number of children: 4  . Years of education: Not on file  . Highest education level: Not on file  Occupational History  . Occupation: retired  Tobacco Use  . Smoking status: Former Smoker    Packs/day: 0.25    Years: 20.00    Pack years: 5.00    Types: Cigarettes    Start date: 04/28/1954    Quit date: 11/28/1974    Years since quitting: 44.3  . Smokeless tobacco: Never Used  . Tobacco comment: quit 35 years ago, 1976  Substance and Sexual Activity  . Alcohol use: Yes    Alcohol/week: 0.0 standard drinks    Comment: occ  . Drug use: No  . Sexual activity: Not Currently  Other Topics Concern  . Not on file  Social History Narrative   Household-- pt, wife, son   Social Determinants of Health   Financial Resource Strain:   . Difficulty of Paying Living Expenses: Not on file  Food Insecurity:   . Worried About Charity fundraiser in the Last Year: Not on file  . Ran Out of Food in the Last Year: Not on file  Transportation Needs:   . Lack of Transportation (Medical): Not on file  . Lack of  Transportation (Non-Medical): Not on file  Physical Activity:   . Days of Exercise per Week: Not on file  . Minutes of Exercise per Session: Not on file  Stress:   . Feeling of Stress : Not on file  Social Connections:   . Frequency of Communication with Friends and Family: Not on file  . Frequency of Social Gatherings with Friends and Family: Not on file  . Attends Religious Services: Not on file  . Active Member of Clubs or Organizations: Not on file  .  Attends Archivist Meetings: Not on file  . Marital Status: Not on file    Outpatient Encounter Medications as of 03/27/2019  Medication Sig  . acetaminophen (TYLENOL) 500 MG tablet Take 1,000 mg by mouth 3 (three) times daily as needed.  Marland Kitchen aspirin 325 MG tablet Take 325 mg by mouth daily.  Marland Kitchen atorvastatin (LIPITOR) 10 MG tablet Take 1 tablet (10 mg total) by mouth at bedtime.  Marland Kitchen b complex vitamins tablet Take 1 tablet by mouth daily.  . calcium carbonate (OSCAL) 1500 (600 Ca) MG TABS tablet Take 600 mg of elemental calcium by mouth daily with breakfast.   . Cyanocobalamin (VITAMIN B-12 PO) Take 5,000 mcg by mouth daily.   . Daratumumab (DARZALEX IV) Inject into the vein every 28 (twenty-eight) days.   Marland Kitchen dexamethasone (DECADRON) 2 MG tablet TAKE 2 TABLETS BY MOUTH DAY AFTER CHEMO THEN 1 TABLET EVERY DAY FOR 4 DAYS (Patient taking differently: Take 2-4 mg by mouth See admin instructions. TAKE 4 MG BY MOUTH DAY AFTER CHEMO THEN 2 MG EVERY DAY FOR 4 DAYS)  . fluticasone (FLONASE) 50 MCG/ACT nasal spray Place 1 spray into both nostrils at bedtime.   . methocarbamol (ROBAXIN) 500 MG tablet Take 1 tablet (500 mg total) by mouth 3 (three) times daily as needed for muscle spasms.  . metoprolol tartrate (LOPRESSOR) 50 MG tablet Take 1 tablet (50 mg total) by mouth 2 (two) times daily.  . Multiple Vitamin (MULTIVITAMIN) tablet Take 1 tablet by mouth daily.  . Omega-3 Fatty Acids (FISH OIL) 1200 MG CAPS Take 1,200 mg by mouth daily.  Marland Kitchen  oxybutynin (DITROPAN-XL) 10 MG 24 hr tablet Take 10 mg by mouth daily.  . pantoprazole (PROTONIX) 40 MG tablet Take 1 tablet (40 mg total) by mouth at bedtime.  . Probiotic Product (PROBIOTIC PO) Take 1 tablet by mouth daily.   . tamsulosin (FLOMAX) 0.4 MG CAPS capsule Take 0.4 mg by mouth daily.  . traMADol (ULTRAM) 50 MG tablet Take 1 tablet (50 mg total) by mouth every 6 (six) hours as needed.  . [DISCONTINUED] oxyCODONE (OXY IR/ROXICODONE) 5 MG immediate release tablet Take 1 tablet (5 mg total) by mouth every 4 (four) hours as needed for severe pain (may need 1-2 tabs first couple weeks following surgery).   Facility-Administered Encounter Medications as of 03/27/2019  Medication  . [COMPLETED] acetaminophen (TYLENOL) tablet 650 mg  . daratumumab (DARZALEX) 1,600 mg in sodium chloride 0.9 % 420 mL (3.2 mg/mL) chemo infusion  . [COMPLETED] daratumumab-hyaluronidase-fihj (DARZALEX FASPRO) 1800-30000 MG-UT/15ML chemo SQ injection 1,800 mg  . [COMPLETED] dexamethasone (DECADRON) tablet 20 mg  . [COMPLETED] diphenhydrAMINE (BENADRYL) capsule 50 mg  . [COMPLETED] prochlorperazine (COMPAZINE) tablet 10 mg  . sodium chloride flush (NS) 0.9 % injection 10 mL  . sodium chloride flush (NS) 0.9 % injection 10 mL  . [DISCONTINUED] 0.9 %  sodium chloride infusion  . [DISCONTINUED] heparin lock flush 100 unit/mL  . [DISCONTINUED] sodium chloride flush (NS) 0.9 % injection 10 mL    Activities of Daily Living In your present state of health, do you have any difficulty performing the following activities: 03/27/2019 02/18/2019  Hearing? Y [place]? N N  Difficulty concentrating or making decisions? N N  Walking or climbing stairs? N Y  Dressing or bathing? N N  Doing errands, shopping? N N  Preparing Food and eating ? N -  Using the Toilet? N -  In the past six months, have  you accidently leaked urine? N -  Do you have problems with loss of bowel control? N -  Managing your  Medications? N -  Managing your Finances? N -  Housekeeping or managing your Housekeeping? N -  Some recent data might be hidden    Patient Care Team: Colon Branch, MD as PCP - General Marin Olp, Rudell Cobb, MD as Consulting Physician (Oncology) Constance Haw, MD as Consulting Physician (Cardiology) Earlie Server, MD as Consulting Physician (Orthopedic Surgery) Laroy Apple, MD as Referring Physician (Physical Medicine and Rehabilitation) Day, Melvenia Beam, [facility] as Pharmacist (Pharmacist)   Assessment:   This is a routine wellness examination for [place]. Physical assessment deferred to PCP.   Exercise Activities and Dietary recommendations Current Exercise Habits: The patient does not participate in regular exercise at present, Exercise limited by: None identified   Diet (meal preparation, eat out, water intake, caffeinated beverages, dairy products, fruits and vegetables): well balanced   Goals    . A1c goal <6.5%    . Blood pressure goal <140/90    . Check blood pressure at least once per week (2-3 times perferably)    . Complete Shingrix vaccine series    . Consider Covid vaccine series    . If blood pressure remains at goal, pick up prescription for metoprolol 38m tablets     -This will be the same dose as taking half tablet of 1046m(5068m -This will prevent you from needing to cut tablets in half -Take one whole 59m80mblet twice daily to achieve same dose as half tablet of 100mg31mce daily    . Lose 10 lbs by next year.   (pt-stated)     Weight today 221. Goal 210    . Pharmacy Care Plan     Current Barriers:  . Chronic Disease Management support, education, and care coordination needs related to HTN, CAD, HLD, Barrett's Esophagus, Arthritis/Pain, BPH, Multiple Myeloma  Pharmacist Clinical Goal(s):  . Blood pressure goal <140/90 . A1c goal <6.5%  Interventions: . Comprehensive medication review performed. . Simplify medication regimen by prescribe metoprolol  59mg 66mtwice daily versus 1/2 tablet of metoprolol 100mg t1m daily . Check blood pressure at least weekly, preferably 2 to 3 times per week . Complete Shingrix vaccine series . Consider Covid vaccine series  Patient Self Care Activities:  . Patient verbalizes understanding of plan to follow as described above, Self administers medications as prescribed, Calls pharmacy for medication refills, and Calls provider office for new concerns or questions  Initial goal documentation     . Practice moderation with carbohydrates       Fall Risk Fall Risk  03/27/2019 03/27/2018 03/21/2017 11/22/2016 01/11/2016  Falls in the past year? 0 0 No No No  Number falls in past yr: 0 - - - -  Injury with Fall? 0 - - - -  Risk for fall due to : - - - - -  Risk for fall due to: Comment - - - - -  Follow up Education provided;Falls prevention discussed - - - -   Depression Screen PHQ 2/9 Scores 03/27/2019 03/27/2018 03/21/2017 11/22/2016  PHQ - 2 Score 0 0 0 0    Cognitive Function Ad8 score reviewed for issues:  Issues making decisions:no  Less interest in hobbies / activities:no  Repeats questions, stories (family complaining):no  Trouble using ordinary gadgets (microwave, computer, phone):no  Forgets the month or year: no  Mismanaging finances: no  Remembering appts:no  Daily problems with thinking  and/or memory:no Ad8 score is=0  MMSE - Mini Mental State Exam 03/21/2017 03/21/2015  Orientation to time 5 5  Orientation to Place 5 5  Registration 3 3  Attention/ Calculation 5 5  Recall 3 3  Language- name 2 objects 2 2  Language- repeat 1 1  Language- follow 3 step command 3 3  Language- read & follow direction 1 1  Write a sentence 1 1  Copy design 1 1  Total score 30 30        Immunization History  Administered Date(s) Administered  . Influenza Split 11/13/2011  . Influenza Whole 01/20/2007, 01/04/2009  . Influenza, High Dose Seasonal PF 11/01/2016, 10/26/2018  .  Influenza,inj,Quad PF,6+ Mos 10/10/2012, 10/28/2017  . Influenza-Unspecified 10/27/2014, 10/04/2015, 11/01/2016  . Pneumococcal Conjugate-13 02/05/2014  . Pneumococcal Polysaccharide-23 01/22/2006  . Td 01/23/2011   Screening Tests Health Maintenance  Topic Date Due  . TETANUS/TDAP  01/22/2021  . INFLUENZA VACCINE  Completed  . PNA vac Low Risk Adult  Completed        Plan:    Please schedule your next medicare wellness visit with me in 1 yr.  Continue to eat heart healthy diet (full of fruits, vegetables, whole grains, lean protein, water--limit salt, fat, and sugar intake) and increase physical activity as tolerated.  Continue doing brain stimulating activities (puzzles, reading, adult coloring books, staying active) to keep memory sharp.     I have personally reviewed and noted the following in the patient's chart:   . Medical and social history . Use of alcohol, tobacco or illicit drugs  . Current medications and supplements . Functional ability and status . Nutritional status . Physical activity . Advanced directives . List of other physicians . Hospitalizations, surgeries, and ER visits in previous 12 months . Vitals . Screenings to include cognitive, depression, and falls . Referrals and appointments  In addition, I have reviewed and discussed with patient certain preventive protocols, quality metrics, and best practice recommendations. A written personalized care plan for preventive services as well as general preventive health recommendations were provided to patient.     Shela Nevin, [place]  03/27/2019

## 2019-03-26 NOTE — Patient Instructions (Signed)
Daratumumab injection What is this medicine? DARATUMUMAB (dar a toom ue mab) is a monoclonal antibody. It is used to treat multiple myeloma. This medicine may be used for other purposes; ask your health care provider or pharmacist if you have questions. COMMON BRAND NAME(S): DARZALEX What should I tell my health care provider before I take this medicine? They need to know if you have any of these conditions:  infection (especially a virus infection such as chickenpox, herpes, or hepatitis B virus)  lung or breathing disease  an unusual or allergic reaction to daratumumab, other medicines, foods, dyes, or preservatives  pregnant or trying to get pregnant  breast-feeding How should I use this medicine? This medicine is for infusion into a vein. It is given by a health care professional in a hospital or clinic setting. Talk to your pediatrician regarding the use of this medicine in children. Special care may be needed. Overdosage: If you think you have taken too much of this medicine contact a poison control center or emergency room at once. NOTE: This medicine is only for you. Do not share this medicine with others. What if I miss a dose? Keep appointments for follow-up doses as directed. It is important not to miss your dose. Call your doctor or health care professional if you are unable to keep an appointment. What may interact with this medicine? Interactions have not been studied. This list may not describe all possible interactions. Give your health care provider a list of all the medicines, herbs, non-prescription drugs, or dietary supplements you use. Also tell them if you smoke, drink alcohol, or use illegal drugs. Some items may interact with your medicine. What should I watch for while using this medicine? This drug may make you feel generally unwell. Report any side effects. Continue your course of treatment even though you feel ill unless your doctor tells you to stop. This  medicine can cause serious allergic reactions. To reduce your risk you may need to take medicine before treatment with this medicine. Take your medicine as directed. This medicine can affect the results of blood tests to match your blood type. These changes can last for up to 6 months after the final dose. Your healthcare provider will do blood tests to match your blood type before you start treatment. Tell all of your healthcare providers that you are being treated with this medicine before receiving a blood transfusion. This medicine can affect the results of some tests used to determine treatment response; extra tests may be needed to evaluate response. Do not become pregnant while taking this medicine or for 3 months after stopping it. Women should inform their doctor if they wish to become pregnant or think they might be pregnant. There is a potential for serious side effects to an unborn child. Talk to your health care professional or pharmacist for more information. What side effects may I notice from receiving this medicine? Side effects that you should report to your doctor or health care professional as soon as possible:  allergic reactions like skin rash, itching or hives, swelling of the face, lips, or tongue  breathing problems  chills  cough  dizziness  feeling faint or lightheaded  headache  low blood counts - this medicine may decrease the number of white blood cells, red blood cells and platelets. You may be at increased risk for infections and bleeding.  nausea, vomiting  shortness of breath  signs of decreased platelets or bleeding - bruising, pinpoint red spots on  the skin, black, tarry stools, blood in the urine  signs of decreased red blood cells - unusually weak or tired, feeling faint or lightheaded, falls  signs of infection - fever or chills, cough, sore throat, pain or difficulty passing urine  signs and symptoms of liver injury like dark yellow or brown  urine; general ill feeling or flu-like symptoms; light-colored stools; loss of appetite; right upper belly pain; unusually weak or tired; yellowing of the eyes or skin Side effects that usually do not require medical attention (report to your doctor or health care professional if they continue or are bothersome):  back pain  constipation  diarrhea  joint pain  muscle cramps  pain, tingling, numbness in the hands or feet  swelling of the ankles, feet, hands  tiredness  trouble sleeping This list may not describe all possible side effects. Call your doctor for medical advice about side effects. You may report side effects to FDA at 1-800-FDA-1088. Where should I keep my medicine? This drug is given in a hospital or clinic and will not be stored at home. NOTE: This sheet is a summary. It may not cover all possible information. If you have questions about this medicine, talk to your doctor, pharmacist, or health care provider.  2020 Elsevier/Gold Standard (2018-09-16 18:10:54)  

## 2019-03-26 NOTE — Progress Notes (Signed)
Hematology and Oncology Follow Up Visit  KARMELLO SILCOX [id number] [birth year] 79 y.o. 03/26/2019   Principle Diagnosis:  IgA kappa myeloma - relapsed post ASCT (+4, +11, 13q- and 17p-)     Past Therapy: Pomalyst stopped 12/25/2016        Current Therapy:   Daratumumab q 4 week dosing -changed on 10/28/2017  - s/p cycle #34 Xgeva 120 m sq q 3 months - next dose 05/2019   Interim History:  Mr. Narez is here today for follow-up.  He is getting through his right knee surgery.  He had this back in February.  He is doing pretty well with his from my point of view.  Thankfully, his wife was able to come in with him today.  He has had no problems with fever.  He has had no problems with cough.  He has had no nausea or vomiting.  He has had no bleeding.  Unfortunately, is not be able to get out into his garden.  He likes to get planting done this time a year but unfortunately his has not been able to do it mostly because of his surgery but also because of the weather.  His myeloma is holding pretty steady.  His last M spike was 0.5 g/dL.  The IgA level was 1300 mg/dL.  His kappa light chain was 9.9 mg/dL.  His weight is holding steady.  He is trying to watch what he eats.  His wife is doing a good job trying to monitor his intake.    Overall, his performance status is ECOG 1.     Medications:  Allergies as of 03/26/2019   No Known Allergies     Medication List       Accurate as of March 26, 2019  9:43 AM. If you have any questions, ask your nurse or doctor.        STOP taking these medications   apixaban 2.5 MG Tabs tablet Commonly known as: Eliquis Stopped by: [name], MD   gabapentin 300 MG capsule Commonly known as: Neurontin Stopped by: [name], MD   mupirocin ointment 2 % Commonly known as: BACTROBAN Stopped by: [name], MD     TAKE these medications   acetaminophen 500 MG tablet Commonly known as: TYLENOL Take 1,000 mg by mouth 3 (three)  times daily as needed.   aspirin 325 MG tablet Take 325 mg by mouth daily.   atorvastatin 10 MG tablet Commonly known as: LIPITOR Take 1 tablet (10 mg total) by mouth at bedtime.   b complex vitamins tablet Take 1 tablet by mouth daily.   calcium carbonate 1500 (600 Ca) MG Tabs tablet Commonly known as: OSCAL Take 600 mg of elemental calcium by mouth daily with breakfast.   DARZALEX IV Inject into the vein every 28 (twenty-eight) days.   dexamethasone 2 MG tablet Commonly known as: DECADRON TAKE 2 TABLETS BY MOUTH DAY AFTER CHEMO THEN 1 TABLET EVERY DAY FOR 4 DAYS What changed:   how much to take  how to take this  when to take this  additional instructions   Fish Oil 1200 MG Caps Take 1,200 mg by mouth daily.   fluticasone 50 MCG/ACT nasal spray Commonly known as: FLONASE Place 1 spray into both nostrils at bedtime.   methocarbamol 500 MG tablet Commonly known as: ROBAXIN Take 1 tablet (500 mg total) by mouth 3 (three) times daily as needed for muscle spasms.   metoprolol tartrate 50 MG tablet  Commonly known as: LOPRESSOR Take 1 tablet (50 mg total) by mouth 2 (two) times daily.   multivitamin tablet Take 1 tablet by mouth daily.   oxybutynin 10 MG 24 hr tablet Commonly known as: DITROPAN-XL Take 10 mg by mouth daily.   oxyCODONE 5 MG immediate release tablet Commonly known as: Oxy IR/ROXICODONE Take 1 tablet (5 mg total) by mouth every 4 (four) hours as needed for severe pain (may need 1-2 tabs first couple weeks following surgery).   pantoprazole 40 MG tablet Commonly known as: PROTONIX Take 1 tablet (40 mg total) by mouth at bedtime.   PROBIOTIC PO Take 1 tablet by mouth daily.   tamsulosin 0.4 MG Caps capsule Commonly known as: FLOMAX Take 0.4 mg by mouth daily.   VITAMIN B-12 PO Take 5,000 mcg by mouth daily.       Allergies: No Known Allergies  Past Medical History, Surgical history, Social history, and Family History were reviewed  and updated.  Review of Systems: Review of Systems  Constitutional: Negative.   HENT: Negative.   Eyes: Negative.   Respiratory: Negative.   Cardiovascular: Negative.   Gastrointestinal: Negative.   Genitourinary: Negative.   Musculoskeletal: Positive for back pain and joint pain.  Skin: Negative.   Neurological: Negative.   Endo/Heme/Allergies: Negative.   Psychiatric/Behavioral: Negative.       Physical Exam:  weight is 209 lb (94.8 kg). His temporal temperature is 97.1 F (36.2 C) (abnormal). His blood pressure is 143/78 (abnormal) and his pulse is 75. His respiration is 18 and oxygen saturation is 96%.   Wt Readings from Last 3 Encounters:  03/26/19 209 lb (94.8 kg)  02/25/19 209 lb (94.8 kg)  02/18/19 209 lb (94.8 kg)  Is  Physical Exam Vitals reviewed.  HENT:     Head: Normocephalic and atraumatic.  Eyes:     Pupils: Pupils are equal, round, and reactive to light.  Cardiovascular:     Rate and Rhythm: Normal rate and regular rhythm.     Heart sounds: Normal heart sounds.  Pulmonary:     Effort: Pulmonary effort is normal.     Breath sounds: Normal breath sounds.  Abdominal:     General: Bowel sounds are normal.     Palpations: Abdomen is soft.  Musculoskeletal:        General: No tenderness or deformity. Normal range of motion.     Cervical back: Normal range of motion.     Comments: His extremities shows the surgical scar with a dressing on the right knee.  There is no swelling.  There is no obvious erythema.  Lymphadenopathy:     Cervical: No cervical adenopathy.  Skin:    General: Skin is warm and dry.     Findings: No erythema or rash.  Neurological:     Mental Status: He is alert and oriented to person, place, and time.  Psychiatric:        Behavior: Behavior normal.        Thought Content: Thought content normal.        Judgment: Judgment normal.      Lab Results  Component Value Date   WBC 3.8 (L) 03/26/2019   HGB 10.8 (L) 03/26/2019    HCT 33.9 (L) 03/26/2019   MCV 103.0 (H) 03/26/2019   PLT 144 (L) 03/26/2019   Lab Results  Component Value Date   FERRITIN 145 02/25/2019   IRON 70 02/25/2019   TIBC 264 02/25/2019   UIBC 194 02/25/2019  IRONPCTSAT 27 02/25/2019   Lab Results  Component Value Date   RETICCTPCT 2.1 02/25/2019   RBC 3.29 (L) 03/26/2019   Lab Results  Component Value Date   KPAFRELGTCHN 99.0 (H) 02/25/2019   LAMBDASER 1.8 (L) 02/25/2019   KAPLAMBRATIO 55.00 (H) 02/25/2019   Lab Results  Component Value Date   IGGSERUM 125 (L) 02/25/2019   IGGSERUM 121 (L) 02/25/2019   IGA 1,318 (H) 02/25/2019   IGA 1,254 (H) 02/25/2019   IGMSERUM <5 (L) 02/25/2019   IGMSERUM <5 (L) 02/25/2019   Lab Results  Component Value Date   TOTALPROTELP 6.7 02/25/2019   ALBUMINELP 3.3 02/25/2019   A1GS 0.3 02/25/2019   A2GS 1.1 (H) 02/25/2019   BETS 1.7 (H) 02/25/2019   BETA2SER 2.0 (H) 12/08/2014   GAMS 0.3 (L) 02/25/2019   MSPIKE 0.5 (H) 02/25/2019   SPEI Comment 09/02/2017     Chemistry      Component Value Date/Time   NA 140 03/26/2019 0913   NA 141 01/21/2017 0741   NA 139 06/14/2016 1246   K 4.1 03/26/2019 0913   K 4.0 01/21/2017 0741   K 3.8 06/14/2016 1246   CL 103 03/26/2019 0913   CL 103 01/21/2017 0741   CO2 28 03/26/2019 0913   CO2 30 01/21/2017 0741   CO2 24 06/14/2016 1246   BUN 13 03/26/2019 0913   BUN 20 01/21/2017 0741   BUN 15.0 06/14/2016 1246   CREATININE 0.92 03/26/2019 0913   CREATININE 1.0 01/21/2017 0741   CREATININE 0.9 06/14/2016 1246      Component Value Date/Time   CALCIUM 9.8 03/26/2019 0913   CALCIUM 9.0 01/21/2017 0741   CALCIUM 9.6 06/14/2016 1246   ALKPHOS 34 (L) 03/26/2019 0913   ALKPHOS 37 01/21/2017 0741   ALKPHOS 54 06/14/2016 1246   AST 14 (L) 03/26/2019 0913   AST 25 06/14/2016 1246   ALT 11 03/26/2019 0913   ALT 29 01/21/2017 0741   ALT 21 06/14/2016 1246   BILITOT 0.3 03/26/2019 0913   BILITOT 0.37 06/14/2016 1246      Impression and Plan:  Mr. Wasil is a very pleasant 79 yo caucasian gentleman with history of IgA kappa myeloma. He had high risk cytogenetics with a 17p- abnormality.   It will be very interesting to see what his myeloma levels look like right now.  Hopefully, everything is still holding steady for him.  Hopefully he will not need any more surgery.  In the past year I think he has had 3 or 4 surgeries.  We will plan to get him back in 4 weeks.         [name], MD 3/4/20219:43 AM

## 2019-03-26 NOTE — Addendum Note (Signed)
Addended by: Randolm Idol on: 03/26/2019 01:00 PM   Modules accepted: Orders, SmartSet

## 2019-03-26 NOTE — Addendum Note (Signed)
Addended by: Burney Gauze R on: 03/26/2019 02:24 PM   Modules accepted: Orders

## 2019-03-27 ENCOUNTER — Ambulatory Visit (INDEPENDENT_AMBULATORY_CARE_PROVIDER_SITE_OTHER): Payer: PPO | Admitting: *Deleted

## 2019-03-27 ENCOUNTER — Encounter: Payer: Self-pay | Admitting: *Deleted

## 2019-03-27 VITALS — BP 127/63

## 2019-03-27 DIAGNOSIS — Z0000 Encounter for general adult medical examination without abnormal findings: Secondary | ICD-10-CM | POA: Diagnosis not present

## 2019-03-27 LAB — KAPPA/LAMBDA LIGHT CHAINS
Kappa free light chain: 343.3 mg/L — ABNORMAL HIGH (ref 3.3–19.4)
Kappa, lambda light chain ratio: 180.68 — ABNORMAL HIGH (ref 0.26–1.65)
Lambda free light chains: 1.9 mg/L — ABNORMAL LOW (ref 5.7–26.3)

## 2019-03-27 LAB — IGG, IGA, IGM
IgA: 1227 mg/dL — ABNORMAL HIGH (ref 61–437)
IgG (Immunoglobin G), Serum: 120 mg/dL — ABNORMAL LOW (ref 603–1613)
IgM (Immunoglobulin M), Srm: <5 mg/dL — ABNORMAL LOW (ref 15–143)

## 2019-03-27 NOTE — Patient Instructions (Signed)
Please schedule your next medicare wellness visit with me in 1 yr.  Continue to eat heart healthy diet (full of fruits, vegetables, whole grains, lean protein, water--limit salt, fat, and sugar intake) and increase physical activity as tolerated.  Continue doing brain stimulating activities (puzzles, reading, adult coloring books, staying active) to keep memory sharp.    [name] , Thank you for taking time to come for your Medicare Wellness Visit. I appreciate your ongoing commitment to your health goals. Please review the following plan we discussed and let me know if I can assist you in the future.   These are the goals we discussed: Goals    . A1c goal <6.5%    . Blood pressure goal <140/90    . Check blood pressure at least once per week (2-3 times perferably)    . Complete Shingrix vaccine series    . Consider Covid vaccine series    . If blood pressure remains at goal, pick up prescription for metoprolol '50mg'$  tablets     -This will be the same dose as taking half tablet of '100mg'$  ('50mg'$ ). -This will prevent you from needing to cut tablets in half -Take one whole '50mg'$  tablet twice daily to achieve same dose as half tablet of '100mg'$  twice daily    . Lose 10 lbs by next year.   (pt-stated)     Weight today 221. Goal 210    . Pharmacy Care Plan     Current Barriers:  . Chronic Disease Management support, education, and care coordination needs related to HTN, CAD, HLD, Barrett's Esophagus, Arthritis/Pain, BPH, Multiple Myeloma  Pharmacist Clinical Goal(s):  . Blood pressure goal <140/90 . A1c goal <6.5%  Interventions: . Comprehensive medication review performed. . Simplify medication regimen by prescribe metoprolol '50mg'$  tab twice daily versus 1/2 tablet of metoprolol '100mg'$  twice daily . Check blood pressure at least weekly, preferably 2 to 3 times per week . Complete Shingrix vaccine series . Consider Covid vaccine series  Patient Self Care Activities:  . Patient  verbalizes understanding of plan to follow as described above, Self administers medications as prescribed, Calls pharmacy for medication refills, and Calls provider office for new concerns or questions  Initial goal documentation     . Practice moderation with carbohydrates       This is a list of the screening recommended for you and due dates:  Health Maintenance  Topic Date Due  . Tetanus Vaccine  01/22/2021  . Flu Shot  Completed  . Pneumonia vaccines  Completed    Preventive Care 54 Years and Older, Male Preventive care refers to lifestyle choices and visits with your health care provider that can promote health and wellness. This includes:  A yearly physical exam. This is also called an annual well check.  Regular dental and eye exams.  Immunizations.  Screening for certain conditions.  Healthy lifestyle choices, such as diet and exercise. What can I expect for my preventive care visit? Physical exam Your health care provider will check:  Height and weight. These may be used to calculate body mass index (BMI), which is a measurement that tells if you are at a healthy weight.  Heart rate and blood pressure.  Your skin for abnormal spots. Counseling Your health care provider may ask you questions about:  Alcohol, tobacco, and drug use.  Emotional well-being.  Home and relationship well-being.  Sexual activity.  Eating habits.  History of falls.  Memory and ability to understand (cognition).  Work  and work environment. What immunizations do I need?  Influenza (flu) vaccine  This is recommended every year. Tetanus, diphtheria, and pertussis (Tdap) vaccine  You may need a Td booster every 10 years. Varicella (chickenpox) vaccine  You may need this vaccine if you have not already been vaccinated. Zoster (shingles) vaccine  You may need this after age 19. Pneumococcal conjugate (PCV13) vaccine  One dose is recommended after age 40. Pneumococcal  polysaccharide (PPSV23) vaccine  One dose is recommended after age 65. Measles, mumps, and rubella (MMR) vaccine  You may need at least one dose of MMR if you were born in 1957 or later. You may also need a second dose. Meningococcal conjugate (MenACWY) vaccine  You may need this if you have certain conditions. Hepatitis A vaccine  You may need this if you have certain conditions or if you travel or work in places where you may be exposed to hepatitis A. Hepatitis B vaccine  You may need this if you have certain conditions or if you travel or work in places where you may be exposed to hepatitis B. Haemophilus influenzae type b (Hib) vaccine  You may need this if you have certain conditions. You may receive vaccines as individual doses or as more than one vaccine together in one shot (combination vaccines). Talk with your health care provider about the risks and benefits of combination vaccines. What tests do I need? Blood tests  Lipid and cholesterol levels. These may be checked every 5 years, or more frequently depending on your overall health.  Hepatitis C test.  Hepatitis B test. Screening  Lung cancer screening. You may have this screening every year starting at age 44 if you have a 30-pack-year history of smoking and currently smoke or have quit within the past 15 years.  Colorectal cancer screening. All adults should have this screening starting at age 27 and continuing until age 91. Your health care provider may recommend screening at age 53 if you are at increased risk. You will have tests every 1-10 years, depending on your results and the type of screening test.  Prostate cancer screening. Recommendations will vary depending on your family history and other risks.  Diabetes screening. This is done by checking your blood sugar (glucose) after you have not eaten for a while (fasting). You may have this done every 1-3 years.  Abdominal aortic aneurysm (AAA) screening. You  may need this if you are a current or former smoker.  Sexually transmitted disease (STD) testing. Follow these instructions at home: Eating and drinking  Eat a diet that includes fresh fruits and vegetables, whole grains, lean protein, and low-fat dairy products. Limit your intake of foods with high amounts of sugar, saturated fats, and salt.  Take vitamin and mineral supplements as recommended by your health care provider.  Do not drink alcohol if your health care provider tells you not to drink.  If you drink alcohol: ? Limit how much you have to 0-2 drinks a day. ? Be aware of how much alcohol is in your drink. In the [place] one drink equals one 12 oz bottle of beer (355 mL), one 5 oz glass of wine (148 mL), or one 1 oz glass of hard liquor (44 mL). Lifestyle  Take daily care of your teeth and gums.  Stay active. Exercise for at least 30 minutes on 5 or more days each week.  Do not use any products that contain nicotine or tobacco, such as cigarettes, e-cigarettes, and chewing tobacco.  If you need help quitting, ask your health care provider.  If you are sexually active, practice safe sex. Use a condom or other form of protection to prevent STIs (sexually transmitted infections).  Talk with your health care provider about taking a low-dose aspirin or statin. What's next?  Visit your health care provider once a year for a well check visit.  Ask your health care provider how often you should have your eyes and teeth checked.  Stay up to date on all vaccines. This information is not intended to replace advice given to you by your health care provider. Make sure you discuss any questions you have with your health care provider. Document Revised: 01/02/2018 Document Reviewed: 01/02/2018 Elsevier Patient Education  2020 [employer].

## 2019-03-30 ENCOUNTER — Ambulatory Visit: Payer: PPO | Admitting: Hematology & Oncology

## 2019-03-31 ENCOUNTER — Encounter: Payer: Self-pay | Admitting: Internal Medicine

## 2019-03-31 ENCOUNTER — Inpatient Hospital Stay (HOSPITAL_BASED_OUTPATIENT_CLINIC_OR_DEPARTMENT_OTHER): Payer: PPO | Admitting: Hematology & Oncology

## 2019-03-31 ENCOUNTER — Encounter: Payer: Self-pay | Admitting: Hematology & Oncology

## 2019-03-31 ENCOUNTER — Ambulatory Visit (INDEPENDENT_AMBULATORY_CARE_PROVIDER_SITE_OTHER): Payer: PPO | Admitting: Internal Medicine

## 2019-03-31 ENCOUNTER — Other Ambulatory Visit: Payer: Self-pay

## 2019-03-31 ENCOUNTER — Telehealth: Payer: Self-pay | Admitting: Hematology & Oncology

## 2019-03-31 VITALS — BP 136/80 | HR 68 | Temp 97.3°F | Resp 16 | Wt 209.1 lb

## 2019-03-31 VITALS — BP 143/71 | HR 68 | Temp 96.5°F | Resp 18 | Ht 65.0 in | Wt 209.1 lb

## 2019-03-31 DIAGNOSIS — Z0000 Encounter for general adult medical examination without abnormal findings: Secondary | ICD-10-CM | POA: Diagnosis not present

## 2019-03-31 DIAGNOSIS — Z5112 Encounter for antineoplastic immunotherapy: Secondary | ICD-10-CM | POA: Diagnosis not present

## 2019-03-31 DIAGNOSIS — C9000 Multiple myeloma not having achieved remission: Secondary | ICD-10-CM

## 2019-03-31 LAB — PROTEIN ELECTROPHORESIS, SERUM, WITH REFLEX
A/G Ratio: 1.2 (ref 0.7–1.7)
Albumin ELP: 3.7 g/dL (ref 2.9–4.4)
Alpha-1-Globulin: 0.2 g/dL (ref 0.0–0.4)
Alpha-2-Globulin: 1 g/dL (ref 0.4–1.0)
Beta Globulin: 1.6 g/dL — ABNORMAL HIGH (ref 0.7–1.3)
Gamma Globulin: 0.3 g/dL — ABNORMAL LOW (ref 0.4–1.8)
Globulin, Total: 3.2 g/dL (ref 2.2–3.9)
M-Spike, %: 0.4 g/dL — ABNORMAL HIGH
SPEP Interpretation: 0
Total Protein ELP: 6.9 g/dL (ref 6.0–8.5)

## 2019-03-31 LAB — IMMUNOFIXATION REFLEX, SERUM
IgA: 1303 mg/dL — ABNORMAL HIGH (ref 61–437)
IgG (Immunoglobin G), Serum: 145 mg/dL — ABNORMAL LOW (ref 603–1613)
IgM (Immunoglobulin M), Srm: <5 mg/dL — ABNORMAL LOW (ref 15–143)

## 2019-03-31 NOTE — Telephone Encounter (Signed)
NO los 3/9

## 2019-03-31 NOTE — Progress Notes (Signed)
Subjective:    Patient ID: Gene Hill, male    DOB: 1940-11-17, 79 y.o.   MRN: [id number]  DOS:  03/31/2019 Type of visit - description: CPX Here with his wife. Recuperated from surgeries from last year, still doing his own physical therapy and stretching at home. Ambulatory BPs are in the normal side when checked at home: 126/60, 117/70.  Review of Systems  A 14 point review of systems is negative    Past Medical History:  Diagnosis Date  . Arthritis   . 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 Blood dyscrasia    low WBC d/t multiple myeloma  . Bulging lumbar disc    L2-3  . Carotid bruit    3-11 carotid u/s was  (-)  . 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 GERD (gastroesophageal reflux disease)   . Headache   . Heart murmur    doesn't have one  . History of hiatal hernia   . HYPERLIPIDEMIA 05/03/2006  . HYPERTENSION 05/03/2006  . 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 Torn rotator cuff 01/22/2017   left- [place]    Past Surgical History:  Procedure Laterality Date  . BACK SURGERY    . CERVICAL FUSION    . IR FLUORO GUIDE PORT INSERTION RIGHT  11/29/2016  . IR US GUIDE VASC ACCESS RIGHT  11/29/2016  . [place]   right  . REVERSE SHOULDER ARTHROPLASTY Right 06/25/2018   Procedure: REVERSE SHOULDER ARTHROPLASTY;  Surgeon: Hiram Gash, MD;  Location: [facility];  Service: Orthopedics;  Laterality: Right;  . REVERSE SHOULDER ARTHROPLASTY Left 11/19/2018   Procedure: REVERSE SHOULDER ARTHROPLASTY;  Surgeon: Hiram Gash, MD;  Location: [facility];  Service: Orthopedics;  Laterality: Left;  . ROTATOR CUFF REPAIR     right  . SPINAL FUSION     lumbar  . TOTAL KNEE ARTHROPLASTY Right 12/23/2015   Procedure: TOTAL KNEE ARTHROPLASTY;  Surgeon: Earlie Server, MD;  Location: [facility];  Service: Orthopedics;  Laterality: Right;  . TOTAL KNEE REVISION Right 02/18/2019  . TOTAL KNEE REVISION Right 02/18/2019   Procedure: TOTAL KNEE REVISION;  Surgeon: Earlie Server, MD;  Location: [facility];  Service: Orthopedics;  Laterality: Right;    Allergies as of 03/31/2019   No Known Allergies     Medication List       Accurate as of March 31, 2019 11:59 PM. If you have any questions, ask your nurse or doctor.        acetaminophen 500 MG tablet Commonly known as: TYLENOL Take 1,000 mg by mouth 3 (three) times daily as needed.   aspirin 325 MG tablet Take 325 mg by mouth daily.   atorvastatin 10 MG tablet Commonly known as: LIPITOR Take 1 tablet (10 mg total) by mouth at bedtime.   b complex vitamins tablet Take 1 tablet by mouth daily.   calcium carbonate 1500 (600 Ca) MG Tabs tablet Commonly known as: OSCAL Take 600 mg of elemental calcium by mouth daily with breakfast.   DARZALEX IV Inject into the vein every 28 (twenty-eight) days.   dexamethasone 2 MG tablet Commonly known as: DECADRON TAKE 2 TABLETS BY MOUTH DAY AFTER CHEMO THEN 1 TABLET EVERY DAY FOR  4 DAYS What changed:   how much to take  how to take this  when to take this  additional instructions   Fish Oil 1200 MG Caps Take 1,200 mg by mouth daily.   fluticasone 50 MCG/ACT nasal spray Commonly known as: FLONASE Place 1 spray into both nostrils at bedtime.   methocarbamol 500 MG tablet Commonly known as: ROBAXIN Take 1 tablet (500 mg total) by mouth 3 (three) times daily as needed for muscle spasms.   metoprolol tartrate 50 MG tablet Commonly known as: LOPRESSOR Take 1 tablet (50 mg total) by mouth 2 (two) times daily.   multivitamin tablet Take 1 tablet by mouth daily.   oxybutynin 10 MG 24 hr tablet Commonly known as: DITROPAN-XL Take 10 mg by mouth daily.   pantoprazole 40 MG tablet Commonly known as: PROTONIX Take 1 tablet (40 mg total) by mouth  at bedtime.   PROBIOTIC PO Take 1 tablet by mouth daily.   tamsulosin 0.4 MG Caps capsule Commonly known as: FLOMAX Take 0.4 mg by mouth daily.   traMADol 50 MG tablet Commonly known as: ULTRAM Take 1 tablet (50 mg total) by mouth every 6 (six) hours as needed.   VITAMIN B-12 PO Take 5,000 mcg by mouth daily.       Family History  Problem Relation Age of Onset  . Kidney cancer Father   . Leukemia Mother   . Pulmonary embolism Brother   . Stroke Maternal Grandmother   . Heart attack Maternal Grandfather   . Stroke Paternal Grandmother   . Cerebral palsy Son   . Prostate cancer Neg Hx   . Colon cancer Neg Hx   . Esophageal cancer Neg Hx   . Rectal cancer Neg Hx   . Stomach cancer Neg Hx          Objective:   Physical Exam BP (!) 143/71 (BP Location: Left Arm, Patient Position: Sitting, Cuff Size: Normal)   Pulse 68   Temp (!) 96.5 F (35.8 C) (Temporal)   Resp 18   Ht '5\' 5"'$  (1.651 m)   Wt 209 lb 2 oz (94.9 kg)   SpO2 97%   BMI 34.80 kg/m  General:   Well developed, NAD, BMI noted.  HEENT:  Normocephalic . Face symmetric, atraumatic Lungs:  CTA B Normal respiratory effort, no intercostal retractions, no accessory muscle use. Heart: RRR,  no murmur.  Abdomen:  Not distended, soft, non-tender. No rebound or rigidity.   Skin: Not pale. Not jaundice Lower extremities: no pretibial edema bilaterally  Neurologic:  alert & oriented X3.  Speech normal, gait tested, is in a wheelchair Psych--  Cognition and judgment appear intact.  Cooperative with normal attention span and concentration.  Behavior appropriate. No anxious or depressed appearing.     Assessment      Assessment Pre-diabetes HTN Hyperlipidemia (self dc  simva 11-2014)  CAD by cath 1998, medical management, Cardiolite ~ 2007 ? Multiple myeloma -- stem cell transplant 2011, with recurrence as off 01-2015  GI: --Barrett' esophagus EGD 2010, 2013 --PUD 2010  [facility] -- generalized aches,  pains, chronic (no better after dc simvastatin ~ 11-2014) -- DJD Dr Percell Miller Hypogonadism Carotid bruit, [place] (-) 2011  PLAN Here for CPX Prediabetes:Due for A1c, will check at the next opportunity HTN:  Ambulatory BPs are normal, repeat today slightly elevated, recommend no change.  Continue with metoprolol. Hyperlipidemia: On Lipitor, will check a FLP at the next opportunity Multiple myeloma: To see oncology today and discussed  recent labs. 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 RTC 4 months   This visit occurred during the SARS-CoV-2 public health emergency.  Safety protocols were in place, including screening questions prior to the visit, additional usage of staff PPE, and extensive cleaning of exam room while observing appropriate contact time as indicated for disinfecting solutions.

## 2019-03-31 NOTE — Progress Notes (Signed)
Hematology and Oncology Follow Up Visit  Gene Hill [id number] 1940-11-26 79 y.o. 03/31/2019   Principle Diagnosis:  IgA kappa myeloma - relapsed post ASCT (+4, +11, 13q- and 17p-)     Past Therapy: Pomalyst stopped 12/25/2016        Current Therapy:   Daratumumab q 4 week dosing -changed on 10/28/2017  - s/p cycle #35 Carfilzomib -- start cycle #1 on 04/07/2019 (3 wk on/1 off) Xgeva 120 m sq q 3 months - next dose 05/2019   Interim History:  Gene Hill is here today for in early visit.  When we sent off his myeloma studies, we found that his kappa light chain went up from 9.9 mg/dL up to 34.3 mg/dL.  This is quite a jump in 1 month.  I do not have back the M spike yet.  His IgA level was holding steady at 1227 mg/dL.  I am worried with the light chain going up so quickly that it may affect his renal function.  His last BUN and creatinine were 13 and 0.92.  I think that we probably would do well by adding Kyprolis to the daratumumab.  He has been on the daratumumab for a year and a half.  He really has done nicely on this.  I think we just added carfilzomib, this would get this back down to a better level.  He is doing okay.  His right knee is little bit sore today.  He had surgery on the right knee recently.  He has had no cough.  He has had no swelling in the legs.  He has had no nausea or vomiting.  I talked to he and his wife at length.  I explained to them why we had to make the change.  I showed them the lab reports.  I explained why an elevated light chain can cause damage to the kidneys.  He has a Port-A-Cath in which will help [place] out.  He has had no issues with bleeding.  He has had no change in bowel or bladder habits.  Overall, I would say his performance status is ECOG 1.  Medications:  Allergies as of 03/31/2019   No Known Allergies     Medication List       Accurate as of March 31, 2019  9:01 AM. If you have any questions, ask your nurse or doctor.        acetaminophen 500 MG tablet Commonly known as: TYLENOL Take 1,000 mg by mouth 3 (three) times daily as needed.   aspirin 325 MG tablet Take 325 mg by mouth daily.   atorvastatin 10 MG tablet Commonly known as: LIPITOR Take 1 tablet (10 mg total) by mouth at bedtime.   b complex vitamins tablet Take 1 tablet by mouth daily.   calcium carbonate 1500 (600 Ca) MG Tabs tablet Commonly known as: OSCAL Take 600 mg of elemental calcium by mouth daily with breakfast.   DARZALEX IV Inject into the vein every 28 (twenty-eight) days.   dexamethasone 2 MG tablet Commonly known as: DECADRON TAKE 2 TABLETS BY MOUTH DAY AFTER CHEMO THEN 1 TABLET EVERY DAY FOR 4 DAYS What changed:   how much to take  how to take this  when to take this  additional instructions   Fish Oil 1200 MG Caps Take 1,200 mg by mouth daily.   fluticasone 50 MCG/ACT nasal spray Commonly known as: FLONASE Place 1 spray into both nostrils at bedtime.   methocarbamol 500 MG  tablet Commonly known as: ROBAXIN Take 1 tablet (500 mg total) by mouth 3 (three) times daily as needed for muscle spasms.   metoprolol tartrate 50 MG tablet Commonly known as: LOPRESSOR Take 1 tablet (50 mg total) by mouth 2 (two) times daily.   multivitamin tablet Take 1 tablet by mouth daily.   oxybutynin 10 MG 24 hr tablet Commonly known as: DITROPAN-XL Take 10 mg by mouth daily.   pantoprazole 40 MG tablet Commonly known as: PROTONIX Take 1 tablet (40 mg total) by mouth at bedtime.   PROBIOTIC PO Take 1 tablet by mouth daily.   tamsulosin 0.4 MG Caps capsule Commonly known as: FLOMAX Take 0.4 mg by mouth daily.   traMADol 50 MG tablet Commonly known as: ULTRAM Take 1 tablet (50 mg total) by mouth every 6 (six) hours as needed.   VITAMIN B-12 PO Take 5,000 mcg by mouth daily.       Allergies: No Known Allergies  Past Medical History, Surgical history, Social history, and Family History were reviewed and  updated.  Review of Systems: Review of Systems  Constitutional: Negative.   HENT: Negative.   Eyes: Negative.   Respiratory: Negative.   Cardiovascular: Negative.   Gastrointestinal: Negative.   Genitourinary: Negative.   Musculoskeletal: Positive for back pain and joint pain.  Skin: Negative.   Neurological: Negative.   Endo/Heme/Allergies: Negative.   Psychiatric/Behavioral: Negative.       Physical Exam:  weight is 209 lb 2 oz (94.9 kg). His temporal temperature is 97.3 F (36.3 C) (abnormal). His blood pressure is 136/80 and his pulse is 68. His respiration is 16 and oxygen saturation is 97%.   Wt Readings from Last 3 Encounters:  03/31/19 209 lb 2 oz (94.9 kg)  03/31/19 209 lb 2 oz (94.9 kg)  03/26/19 209 lb (94.8 kg)  Is  Physical Exam Vitals reviewed.  HENT:     Head: Normocephalic and atraumatic.  Eyes:     Pupils: Pupils are equal, round, and reactive to light.  Cardiovascular:     Rate and Rhythm: Normal rate and regular rhythm.     Heart sounds: Normal heart sounds.  Pulmonary:     Effort: Pulmonary effort is normal.     Breath sounds: Normal breath sounds.  Abdominal:     General: Bowel sounds are normal.     Palpations: Abdomen is soft.  Musculoskeletal:        General: No tenderness or deformity. Normal range of motion.     Cervical back: Normal range of motion.     Comments: His extremities shows the surgical scar with a dressing on the right knee.  There is no swelling.  There is no obvious erythema.  Lymphadenopathy:     Cervical: No cervical adenopathy.  Skin:    General: Skin is warm and dry.     Findings: No erythema or rash.  Neurological:     Mental Status: He is alert and oriented to person, place, and time.  Psychiatric:        Behavior: Behavior normal.        Thought Content: Thought content normal.        Judgment: Judgment normal.      Lab Results  Component Value Date   WBC 3.8 (L) 03/26/2019   HGB 10.8 (L) 03/26/2019    HCT 33.9 (L) 03/26/2019   MCV 103.0 (H) 03/26/2019   PLT 144 (L) 03/26/2019   Lab Results  Component Value Date  FERRITIN 69 03/26/2019   IRON 82 03/26/2019   TIBC 320 03/26/2019   UIBC 238 03/26/2019   IRONPCTSAT 26 03/26/2019   Lab Results  Component Value Date   RETICCTPCT 2.1 02/25/2019   RBC 3.29 (L) 03/26/2019   Lab Results  Component Value Date   KPAFRELGTCHN 343.3 (H) 03/26/2019   LAMBDASER 1.9 (L) 03/26/2019   KAPLAMBRATIO 180.68 (H) 03/26/2019   Lab Results  Component Value Date   IGGSERUM 120 (L) 03/26/2019   IGA 1,227 (H) 03/26/2019   IGMSERUM <5 (L) 03/26/2019   Lab Results  Component Value Date   TOTALPROTELP 6.7 02/25/2019   ALBUMINELP 3.3 02/25/2019   A1GS 0.3 02/25/2019   A2GS 1.1 (H) 02/25/2019   BETS 1.7 (H) 02/25/2019   BETA2SER 2.0 (H) 12/08/2014   GAMS 0.3 (L) 02/25/2019   MSPIKE 0.5 (H) 02/25/2019   SPEI Comment 09/02/2017     Chemistry      Component Value Date/Time   NA 140 03/26/2019 0913   NA 141 01/21/2017 0741   NA 139 06/14/2016 1246   K 4.1 03/26/2019 0913   K 4.0 01/21/2017 0741   K 3.8 06/14/2016 1246   CL 103 03/26/2019 0913   CL 103 01/21/2017 0741   CO2 28 03/26/2019 0913   CO2 30 01/21/2017 0741   CO2 24 06/14/2016 1246   BUN 13 03/26/2019 0913   BUN 20 01/21/2017 0741   BUN 15.0 06/14/2016 1246   CREATININE 0.92 03/26/2019 0913   CREATININE 1.0 01/21/2017 0741   CREATININE 0.9 06/14/2016 1246      Component Value Date/Time   CALCIUM 9.8 03/26/2019 0913   CALCIUM 9.0 01/21/2017 0741   CALCIUM 9.6 06/14/2016 1246   ALKPHOS 34 (L) 03/26/2019 0913   ALKPHOS 37 01/21/2017 0741   ALKPHOS 54 06/14/2016 1246   AST 14 (L) 03/26/2019 0913   AST 25 06/14/2016 1246   ALT 11 03/26/2019 0913   ALT 29 01/21/2017 0741   ALT 21 06/14/2016 1246   BILITOT 0.3 03/26/2019 0913   BILITOT 0.37 06/14/2016 1246      Impression and Plan: Mr. Soscia is a very pleasant 79 yo caucasian gentleman with history of IgA kappa  myeloma. He had high risk cytogenetics with a 17p- abnormality.   I really think we have done quite nicely with the daratumumab.  Again has been on it for over a year and a half.  He is done nicely.  I just hate that the light chains gone up.  I want to be "proactive" and try to get the light chain level back down so that we would not run into problems with kidney issues.  I would like to try to get started next week.  I realize that his daratumumab dose is planned for March 31.  Ultimately, we will coordinate everything together.  I just do not want to wait another 3 weeks or so and potentially see the light chain levels go up much more quickly and then we had kidney trouble.  I will plan for the carfilzomib to be given 3 weeks on and 1 week off.  If we see a good response, then we will start to pull back a little bit and do the carfilzomib less often.  I will see him as scheduled on 31 March.  I appreciate the fact that they were able to come in so quickly to see [place].       [name], MD 3/9/20219:01 AM

## 2019-03-31 NOTE — Progress Notes (Signed)
Pre visit review using our clinic review tool, if applicable. No additional management support is needed unless otherwise documented below in the visit note. 

## 2019-03-31 NOTE — Patient Instructions (Addendum)
  GO TO THE FRONT DESK Come back for a checkup in 4 months, please make an appointment  Continue checking your blood pressures BP GOAL is between 110/65 and  135/85. If it is consistently higher or lower, let me know

## 2019-03-31 NOTE — Progress Notes (Signed)
ON PATHWAY REGIMEN - Multiple Myeloma and Other Plasma Cell Dyscrasias  No Change  Continue With Treatment as Ordered.     A cycle is every 28 days:     Daratumumab   **Always confirm dose/schedule in your pharmacy ordering system**  Patient Characteristics: Relapsed / Refractory, All Lines of Therapy R-ISS Staging: Not Applicable Disease Classification: Relapsed Line of Therapy: Third Line Intent of Therapy: Non-Curative / Palliative Intent, Discussed with Patient

## 2019-04-01 NOTE — Assessment & Plan Note (Signed)
-  Td 2013 - pneumonia 23: 2008 and~ 2011  - prevnar: 2016 - shingrix: d/w pt before - has not have a  covid shot  yet -No further prostate or colon cancer screening. See previous entries  - diet d/w pt;  exercise limited -Labs reviewed, we will get a FLP and A1c at the next oportunity

## 2019-04-01 NOTE — Assessment & Plan Note (Signed)
Here for CPX Prediabetes:Due for A1c, will check at the next opportunity HTN:  Ambulatory BPs are normal, repeat today slightly elevated, recommend no change.  Continue with metoprolol. Hyperlipidemia: On Lipitor, will check a FLP at the next opportunity Multiple myeloma: To see oncology today and discussed recent labs. 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 RTC 4 months

## 2019-04-07 ENCOUNTER — Inpatient Hospital Stay: Payer: PPO

## 2019-04-07 ENCOUNTER — Other Ambulatory Visit: Payer: Self-pay

## 2019-04-07 DIAGNOSIS — C9000 Multiple myeloma not having achieved remission: Secondary | ICD-10-CM

## 2019-04-07 DIAGNOSIS — Z5112 Encounter for antineoplastic immunotherapy: Secondary | ICD-10-CM | POA: Diagnosis not present

## 2019-04-07 LAB — CMP (CANCER CENTER ONLY)
ALT: 16 U/L (ref 0–44)
AST: 16 U/L (ref 15–41)
Albumin: 4.1 g/dL (ref 3.5–5.0)
Alkaline Phosphatase: 30 U/L — ABNORMAL LOW (ref 38–126)
Anion gap: 9 (ref 5–15)
BUN: 15 mg/dL (ref 8–23)
CO2: 28 mmol/L (ref 22–32)
Calcium: 9.4 mg/dL (ref 8.9–10.3)
Chloride: 104 mmol/L (ref 98–111)
Creatinine: 0.95 mg/dL (ref 0.61–1.24)
GFR, Est AFR Am: >60 mL/min (ref >60)
GFR, Estimated: >60 mL/min (ref >60)
Glucose, Bld: 109 mg/dL — ABNORMAL HIGH (ref 70–99)
Potassium: 4 mmol/L (ref 3.5–5.1)
Sodium: 141 mmol/L (ref 135–145)
Total Bilirubin: 0.3 mg/dL (ref 0.3–1.2)
Total Protein: 6.5 g/dL (ref 6.5–8.1)

## 2019-04-07 LAB — CBC WITH DIFFERENTIAL (CANCER CENTER ONLY)
Abs Immature Granulocytes: 0.01 10*3/uL (ref 0.00–0.07)
Basophils Absolute: 0 10*3/uL (ref 0.0–0.1)
Basophils Relative: 1 %
Eosinophils Absolute: 0 10*3/uL (ref 0.0–0.5)
Eosinophils Relative: 1 %
HCT: 31.6 % — ABNORMAL LOW (ref 39.0–52.0)
Hemoglobin: 10 g/dL — ABNORMAL LOW (ref 13.0–17.0)
Immature Granulocytes: 0 %
Lymphocytes Relative: 33 %
Lymphs Abs: 1.4 10*3/uL (ref 0.7–4.0)
MCH: 32.9 pg (ref 26.0–34.0)
MCHC: 31.6 g/dL (ref 30.0–36.0)
MCV: 103.9 fL — ABNORMAL HIGH (ref 80.0–100.0)
Monocytes Absolute: 0.5 10*3/uL (ref 0.1–1.0)
Monocytes Relative: 11 %
Neutro Abs: 2.3 10*3/uL (ref 1.7–7.7)
Neutrophils Relative %: 54 %
Platelet Count: 135 10*3/uL — ABNORMAL LOW (ref 150–400)
RBC: 3.04 MIL/uL — ABNORMAL LOW (ref 4.22–5.81)
RDW: 15.2 % (ref 11.5–15.5)
WBC Count: 4.2 10*3/uL (ref 4.0–10.5)
nRBC: 0 % (ref 0.0–0.2)

## 2019-04-07 MED ORDER — PROCHLORPERAZINE MALEATE 10 MG PO TABS
10.0000 mg | ORAL_TABLET | Freq: Once | ORAL | Status: AC
  Administered 2019-04-07: 10:00:00 10 mg via ORAL

## 2019-04-07 MED ORDER — SODIUM CHLORIDE 0.9 % IV SOLN
Freq: Once | INTRAVENOUS | Status: AC
  Filled 2019-04-07: qty 250

## 2019-04-07 MED ORDER — DEXTROSE 5 % IV SOLN
20.0000 mg/m2 | Freq: Once | INTRAVENOUS | Status: AC
  Administered 2019-04-07: 11:00:00 44 mg via INTRAVENOUS
  Filled 2019-04-07: qty 15

## 2019-04-07 MED ORDER — HEPARIN SOD (PORK) LOCK FLUSH 100 UNIT/ML IV SOLN
500.0000 [IU] | Freq: Once | INTRAVENOUS | Status: AC | PRN
  Administered 2019-04-07: 12:00:00 500 [IU]
  Filled 2019-04-07: qty 5

## 2019-04-07 MED ORDER — DEXAMETHASONE SODIUM PHOSPHATE 10 MG/ML IJ SOLN
10.0000 mg | Freq: Once | INTRAMUSCULAR | Status: AC
  Administered 2019-04-07: 10:00:00 10 mg via INTRAVENOUS

## 2019-04-07 MED ORDER — SODIUM CHLORIDE 0.9% FLUSH
10.0000 mL | INTRAVENOUS | Status: DC | PRN
  Administered 2019-04-07: 10 mL
  Filled 2019-04-07: qty 10

## 2019-04-07 MED ORDER — PROCHLORPERAZINE MALEATE 10 MG PO TABS
ORAL_TABLET | ORAL | Status: AC
  Filled 2019-04-07: qty 1

## 2019-04-07 MED ORDER — DEXAMETHASONE SODIUM PHOSPHATE 10 MG/ML IJ SOLN
INTRAMUSCULAR | Status: AC
  Filled 2019-04-07: qty 1

## 2019-04-07 NOTE — Patient Instructions (Signed)
Carfilzomib injection What is this medicine? CARFILZOMIB (kar FILZ oh mib) targets a specific protein within cancer cells and stops the cancer cells from growing. It is used to treat multiple myeloma. This medicine may be used for other purposes; ask your health care provider or pharmacist if you have questions. COMMON BRAND NAME(S): KYPROLIS What should I tell my health care provider before I take this medicine? They need to know if you have any of these conditions:  heart disease  history of blood clots  irregular heartbeat  kidney disease  liver disease  lung or breathing disease  an unusual or allergic reaction to carfilzomib, or other medicines, foods, dyes, or preservatives  pregnant or trying to get pregnant  breast-feeding How should I use this medicine? This medicine is for injection or infusion into a vein. It is given by a health care professional in a hospital or clinic setting. Talk to your pediatrician regarding the use of this medicine in children. Special care may be needed. Overdosage: If you think you have taken too much of this medicine contact a poison control center or emergency room at once. NOTE: This medicine is only for you. Do not share this medicine with others. What if I miss a dose? It is important not to miss your dose. Call your doctor or health care professional if you are unable to keep an appointment. What may interact with this medicine? Interactions are not expected. Give your health care provider a list of all the medicines, herbs, non-prescription drugs, or dietary supplements you use. Also tell them if you smoke, drink alcohol, or use illegal drugs. Some items may interact with your medicine. This list may not describe all possible interactions. Give your health care provider a list of all the medicines, herbs, non-prescription drugs, or dietary supplements you use. Also tell them if you smoke, drink alcohol, or use illegal drugs. Some items  may interact with your medicine. What should I watch for while using this medicine? Your condition will be monitored carefully while you are receiving this medicine. Report any side effects. Continue your course of treatment even though you feel ill unless your doctor tells you to stop. You may need blood work done while you are taking this medicine. Do not become pregnant while taking this medicine or for at least 6 months after stopping it. Women should inform their doctor if they wish to become pregnant or think they might be pregnant. There is a potential for serious side effects to an unborn child. Men should not father a child while taking this medicine and for at least 3 months after stopping it. Talk to your health care professional or pharmacist for more information. Do not breast-feed an infant while taking this medicine or for 2 weeks after the last dose. Check with your doctor or health care professional if you get an attack of severe diarrhea, nausea and vomiting, or if you sweat a lot. The loss of too much body fluid can make it dangerous for you to take this medicine. You may get dizzy. Do not drive, use machinery, or do anything that needs mental alertness until you know how this medicine affects you. Do not stand or sit up quickly, especially if you are an older patient. This reduces the risk of dizzy or fainting spells. What side effects may I notice from receiving this medicine? Side effects that you should report to your doctor or health care professional as soon as possible:  allergic reactions like skin  rash, itching or hives, swelling of the face, lips, or tongue  confusion  dizziness  feeling faint or lightheaded  fever or chills  palpitations  seizures  signs and symptoms of bleeding such as bloody or black, tarry stools; red or dark-brown urine; spitting up blood or brown material that looks like coffee grounds; red spots on the skin; unusual bruising or bleeding  including from the eye, gums, or nose  signs and symptoms of a blood clot such as breathing problems; changes in vision; chest pain; severe, sudden headache; pain, swelling, warmth in the leg; trouble speaking; sudden numbness or weakness of the face, arm or leg  signs and symptoms of kidney injury like trouble passing urine or change in the amount of urine  signs and symptoms of liver injury like dark yellow or brown urine; general ill feeling or flu-like symptoms; light-colored stools; loss of appetite; nausea; right upper belly pain; unusually weak or tired; yellowing of the eyes or skin Side effects that usually do not require medical attention (report to your doctor or health care professional if they continue or are bothersome):  back pain  cough  diarrhea  headache  muscle cramps  trouble sleeping  vomiting This list may not describe all possible side effects. Call your doctor for medical advice about side effects. You may report side effects to FDA at 1-800-FDA-1088. Where should I keep my medicine? This drug is given in a hospital or clinic and will not be stored at home. NOTE: This sheet is a summary. It may not cover all possible information. If you have questions about this medicine, talk to your doctor, pharmacist, or health care provider.  2020 Elsevier/Gold Standard (2018-09-15 19:44:21)  

## 2019-04-07 NOTE — Progress Notes (Signed)
No need for NS bolus prior to Kyprolis today per Dr. Marin Olp.

## 2019-04-07 NOTE — Patient Instructions (Signed)

## 2019-04-08 LAB — KAPPA/LAMBDA LIGHT CHAINS
Kappa free light chain: 313.5 mg/L — ABNORMAL HIGH (ref 3.3–19.4)
Kappa, lambda light chain ratio: 149.29 — ABNORMAL HIGH (ref 0.26–1.65)
Lambda free light chains: 2.1 mg/L — ABNORMAL LOW (ref 5.7–26.3)

## 2019-04-08 LAB — IGG, IGA, IGM
IgA: 1157 mg/dL — ABNORMAL HIGH (ref 61–437)
IgG (Immunoglobin G), Serum: 134 mg/dL — ABNORMAL LOW (ref 603–1613)
IgM (Immunoglobulin M), Srm: <5 mg/dL — ABNORMAL LOW (ref 15–143)

## 2019-04-09 DIAGNOSIS — R351 Nocturia: Secondary | ICD-10-CM | POA: Diagnosis not present

## 2019-04-09 DIAGNOSIS — R3914 Feeling of incomplete bladder emptying: Secondary | ICD-10-CM | POA: Diagnosis not present

## 2019-04-09 DIAGNOSIS — N200 Calculus of kidney: Secondary | ICD-10-CM | POA: Diagnosis not present

## 2019-04-09 DIAGNOSIS — R350 Frequency of micturition: Secondary | ICD-10-CM | POA: Diagnosis not present

## 2019-04-10 LAB — IMMUNOFIXATION REFLEX, SERUM
IgA: 1218 mg/dL — ABNORMAL HIGH (ref 61–437)
IgG (Immunoglobin G), Serum: 145 mg/dL — ABNORMAL LOW (ref 603–1613)
IgM (Immunoglobulin M), Srm: <5 mg/dL — ABNORMAL LOW (ref 15–143)

## 2019-04-10 LAB — PROTEIN ELECTROPHORESIS, SERUM, WITH REFLEX
A/G Ratio: 1.2 (ref 0.7–1.7)
Albumin ELP: 3.5 g/dL (ref 2.9–4.4)
Alpha-1-Globulin: 0.2 g/dL (ref 0.0–0.4)
Alpha-2-Globulin: 1 g/dL (ref 0.4–1.0)
Beta Globulin: 1.4 g/dL — ABNORMAL HIGH (ref 0.7–1.3)
Gamma Globulin: 0.3 g/dL — ABNORMAL LOW (ref 0.4–1.8)
Globulin, Total: 2.9 g/dL (ref 2.2–3.9)
M-Spike, %: 0.3 g/dL — ABNORMAL HIGH
SPEP Interpretation: 0
Total Protein ELP: 6.4 g/dL (ref 6.0–8.5)

## 2019-04-13 ENCOUNTER — Telehealth: Payer: Self-pay | Admitting: *Deleted

## 2019-04-13 ENCOUNTER — Other Ambulatory Visit: Payer: Self-pay | Admitting: *Deleted

## 2019-04-13 DIAGNOSIS — M25661 Stiffness of right knee, not elsewhere classified: Secondary | ICD-10-CM | POA: Diagnosis not present

## 2019-04-13 DIAGNOSIS — C9000 Multiple myeloma not having achieved remission: Secondary | ICD-10-CM

## 2019-04-13 NOTE — Telephone Encounter (Signed)
Pt notified per order of Dr. Marin Olp that "the myeloma level is down to 0.3 from 0.4.  This is a good sign!!"  Pt appreciative of call and has no questions or concerns at this time.

## 2019-04-13 NOTE — Telephone Encounter (Signed)
-----   Message from Gene Napoleon, MD sent at 04/12/2019  7:41 PM EDT ----- Call - the myeloma level is down to 0.3 from 0.4.  this is a good sign!!  Gene Hill

## 2019-04-14 ENCOUNTER — Inpatient Hospital Stay: Payer: PPO

## 2019-04-14 ENCOUNTER — Other Ambulatory Visit: Payer: PPO

## 2019-04-14 ENCOUNTER — Ambulatory Visit: Payer: PPO

## 2019-04-14 ENCOUNTER — Other Ambulatory Visit: Payer: Self-pay

## 2019-04-14 DIAGNOSIS — C9000 Multiple myeloma not having achieved remission: Secondary | ICD-10-CM

## 2019-04-14 DIAGNOSIS — Z5112 Encounter for antineoplastic immunotherapy: Secondary | ICD-10-CM | POA: Diagnosis not present

## 2019-04-14 LAB — CMP (CANCER CENTER ONLY)
ALT: 15 U/L (ref 0–44)
AST: 15 U/L (ref 15–41)
Albumin: 4.1 g/dL (ref 3.5–5.0)
Alkaline Phosphatase: 32 U/L — ABNORMAL LOW (ref 38–126)
Anion gap: 9 (ref 5–15)
BUN: 14 mg/dL (ref 8–23)
CO2: 27 mmol/L (ref 22–32)
Calcium: 9.5 mg/dL (ref 8.9–10.3)
Chloride: 104 mmol/L (ref 98–111)
Creatinine: 0.9 mg/dL (ref 0.61–1.24)
GFR, Est AFR Am: >60 mL/min (ref >60)
GFR, Estimated: >60 mL/min (ref >60)
Glucose, Bld: 112 mg/dL — ABNORMAL HIGH (ref 70–99)
Potassium: 3.9 mmol/L (ref 3.5–5.1)
Sodium: 140 mmol/L (ref 135–145)
Total Bilirubin: 0.3 mg/dL (ref 0.3–1.2)
Total Protein: 6.6 g/dL (ref 6.5–8.1)

## 2019-04-14 LAB — CBC WITH DIFFERENTIAL (CANCER CENTER ONLY)
Abs Immature Granulocytes: 0.01 10*3/uL (ref 0.00–0.07)
Basophils Absolute: 0 10*3/uL (ref 0.0–0.1)
Basophils Relative: 0 %
Eosinophils Absolute: 0 10*3/uL (ref 0.0–0.5)
Eosinophils Relative: 1 %
HCT: 32.4 % — ABNORMAL LOW (ref 39.0–52.0)
Hemoglobin: 10.3 g/dL — ABNORMAL LOW (ref 13.0–17.0)
Immature Granulocytes: 0 %
Lymphocytes Relative: 38 %
Lymphs Abs: 1.2 10*3/uL (ref 0.7–4.0)
MCH: 32.9 pg (ref 26.0–34.0)
MCHC: 31.8 g/dL (ref 30.0–36.0)
MCV: 103.5 fL — ABNORMAL HIGH (ref 80.0–100.0)
Monocytes Absolute: 0.4 10*3/uL (ref 0.1–1.0)
Monocytes Relative: 12 %
Neutro Abs: 1.6 10*3/uL — ABNORMAL LOW (ref 1.7–7.7)
Neutrophils Relative %: 49 %
Platelet Count: 121 10*3/uL — ABNORMAL LOW (ref 150–400)
RBC: 3.13 MIL/uL — ABNORMAL LOW (ref 4.22–5.81)
RDW: 15 % (ref 11.5–15.5)
WBC Count: 3.3 10*3/uL — ABNORMAL LOW (ref 4.0–10.5)
nRBC: 0 % (ref 0.0–0.2)

## 2019-04-14 MED ORDER — PROCHLORPERAZINE MALEATE 10 MG PO TABS
ORAL_TABLET | ORAL | Status: AC
  Filled 2019-04-14: qty 1

## 2019-04-14 MED ORDER — SODIUM CHLORIDE 0.9 % IV SOLN
Freq: Once | INTRAVENOUS | Status: AC
  Filled 2019-04-14: qty 250

## 2019-04-14 MED ORDER — SODIUM CHLORIDE 0.9% FLUSH
10.0000 mL | INTRAVENOUS | Status: DC | PRN
  Administered 2019-04-14: 10 mL
  Filled 2019-04-14: qty 10

## 2019-04-14 MED ORDER — PROCHLORPERAZINE MALEATE 10 MG PO TABS
10.0000 mg | ORAL_TABLET | Freq: Once | ORAL | Status: AC
  Administered 2019-04-14: 10 mg via ORAL

## 2019-04-14 MED ORDER — HEPARIN SOD (PORK) LOCK FLUSH 100 UNIT/ML IV SOLN
500.0000 [IU] | Freq: Once | INTRAVENOUS | Status: AC | PRN
  Administered 2019-04-14: 500 [IU]
  Filled 2019-04-14: qty 5

## 2019-04-14 MED ORDER — DEXAMETHASONE SODIUM PHOSPHATE 10 MG/ML IJ SOLN
INTRAMUSCULAR | Status: AC
  Filled 2019-04-14: qty 1

## 2019-04-14 MED ORDER — DEXTROSE 5 % IV SOLN
54.5000 mg/m2 | INTRAVENOUS | Status: DC
  Administered 2019-04-14: 120 mg via INTRAVENOUS
  Filled 2019-04-14: qty 60

## 2019-04-14 MED ORDER — DEXAMETHASONE SODIUM PHOSPHATE 10 MG/ML IJ SOLN
10.0000 mg | Freq: Once | INTRAMUSCULAR | Status: AC
  Administered 2019-04-14: 10 mg via INTRAVENOUS

## 2019-04-14 NOTE — Patient Instructions (Signed)
Carfilzomib injection What is this medicine? CARFILZOMIB (kar FILZ oh mib) targets a specific protein within cancer cells and stops the cancer cells from growing. It is used to treat multiple myeloma. This medicine may be used for other purposes; ask your health care provider or pharmacist if you have questions. COMMON BRAND NAME(S): KYPROLIS What should I tell my health care provider before I take this medicine? They need to know if you have any of these conditions:  heart disease  history of blood clots  irregular heartbeat  kidney disease  liver disease  lung or breathing disease  an unusual or allergic reaction to carfilzomib, or other medicines, foods, dyes, or preservatives  pregnant or trying to get pregnant  breast-feeding How should I use this medicine? This medicine is for injection or infusion into a vein. It is given by a health care professional in a hospital or clinic setting. Talk to your pediatrician regarding the use of this medicine in children. Special care may be needed. Overdosage: If you think you have taken too much of this medicine contact a poison control center or emergency room at once. NOTE: This medicine is only for you. Do not share this medicine with others. What if I miss a dose? It is important not to miss your dose. Call your doctor or health care professional if you are unable to keep an appointment. What may interact with this medicine? Interactions are not expected. Give your health care provider a list of all the medicines, herbs, non-prescription drugs, or dietary supplements you use. Also tell them if you smoke, drink alcohol, or use illegal drugs. Some items may interact with your medicine. This list may not describe all possible interactions. Give your health care provider a list of all the medicines, herbs, non-prescription drugs, or dietary supplements you use. Also tell them if you smoke, drink alcohol, or use illegal drugs. Some items  may interact with your medicine. What should I watch for while using this medicine? Your condition will be monitored carefully while you are receiving this medicine. Report any side effects. Continue your course of treatment even though you feel ill unless your doctor tells you to stop. You may need blood work done while you are taking this medicine. Do not become pregnant while taking this medicine or for at least 6 months after stopping it. Women should inform their doctor if they wish to become pregnant or think they might be pregnant. There is a potential for serious side effects to an unborn child. Men should not father a child while taking this medicine and for at least 3 months after stopping it. Talk to your health care professional or pharmacist for more information. Do not breast-feed an infant while taking this medicine or for 2 weeks after the last dose. Check with your doctor or health care professional if you get an attack of severe diarrhea, nausea and vomiting, or if you sweat a lot. The loss of too much body fluid can make it dangerous for you to take this medicine. You may get dizzy. Do not drive, use machinery, or do anything that needs mental alertness until you know how this medicine affects you. Do not stand or sit up quickly, especially if you are an older patient. This reduces the risk of dizzy or fainting spells. What side effects may I notice from receiving this medicine? Side effects that you should report to your doctor or health care professional as soon as possible:  allergic reactions like skin  rash, itching or hives, swelling of the face, lips, or tongue  confusion  dizziness  feeling faint or lightheaded  fever or chills  palpitations  seizures  signs and symptoms of bleeding such as bloody or black, tarry stools; red or dark-brown urine; spitting up blood or brown material that looks like coffee grounds; red spots on the skin; unusual bruising or bleeding  including from the eye, gums, or nose  signs and symptoms of a blood clot such as breathing problems; changes in vision; chest pain; severe, sudden headache; pain, swelling, warmth in the leg; trouble speaking; sudden numbness or weakness of the face, arm or leg  signs and symptoms of kidney injury like trouble passing urine or change in the amount of urine  signs and symptoms of liver injury like dark yellow or brown urine; general ill feeling or flu-like symptoms; light-colored stools; loss of appetite; nausea; right upper belly pain; unusually weak or tired; yellowing of the eyes or skin Side effects that usually do not require medical attention (report to your doctor or health care professional if they continue or are bothersome):  back pain  cough  diarrhea  headache  muscle cramps  trouble sleeping  vomiting This list may not describe all possible side effects. Call your doctor for medical advice about side effects. You may report side effects to FDA at 1-800-FDA-1088. Where should I keep my medicine? This drug is given in a hospital or clinic and will not be stored at home. NOTE: This sheet is a summary. It may not cover all possible information. If you have questions about this medicine, talk to your doctor, pharmacist, or health care provider.  2020 Elsevier/Gold Standard (2018-09-15 19:44:21)  

## 2019-04-14 NOTE — Patient Instructions (Signed)
Implanted Port Insertion, Care After °This sheet gives you information about how to care for yourself after your procedure. Your health care provider may also give you more specific instructions. If you have problems or questions, contact your health care provider. °What can I expect after the procedure? °After the procedure, it is common to have: °· Discomfort at the port insertion site. °· Bruising on the skin over the port. This should improve over 3-4 days. °Follow these instructions at home: °Port care °· After your port is placed, you will get a manufacturer's information card. The card has information about your port. Keep this card with you at all times. °· Take care of the port as told by your health care provider. Ask your health care provider if you or a family member can get training for taking care of the port at home. A home health care nurse may also take care of the port. °· Make sure to remember what type of port you have. °Incision care ° °  ° °· Follow instructions from your health care provider about how to take care of your port insertion site. Make sure you: °? Wash your hands with soap and water before and after you change your bandage (dressing). If soap and water are not available, use hand sanitizer. °? Change your dressing as told by your health care provider. °? Leave stitches (sutures), skin glue, or adhesive strips in place. These skin closures may need to stay in place for 2 weeks or longer. If adhesive strip edges start to loosen and curl up, you may trim the loose edges. Do not remove adhesive strips completely unless your health care provider tells you to do that. °· Check your port insertion site every day for signs of infection. Check for: °? Redness, swelling, or pain. °? Fluid or blood. °? Warmth. °? Pus or a bad smell. °Activity °· Return to your normal activities as told by your health care provider. Ask your health care provider what activities are safe for you. °· Do not  lift anything that is heavier than 10 lb (4.5 kg), or the limit that you are told, until your health care provider says that it is safe. °General instructions °· Take over-the-counter and prescription medicines only as told by your health care provider. °· Do not take baths, swim, or use a hot tub until your health care provider approves. Ask your health care provider if you may take showers. You may only be allowed to take sponge baths. °· Do not drive for 24 hours if you were given a sedative during your procedure. °· Wear a medical alert bracelet in case of an emergency. This will tell any health care providers that you have a port. °· Keep all follow-up visits as told by your health care provider. This is important. °Contact a health care provider if: °· You cannot flush your port with saline as directed, or you cannot draw blood from the port. °· You have a fever or chills. °· You have redness, swelling, or pain around your port insertion site. °· You have fluid or blood coming from your port insertion site. °· Your port insertion site feels warm to the touch. °· You have pus or a bad smell coming from the port insertion site. °Get help right away if: °· You have chest pain or shortness of breath. °· You have bleeding from your port that you cannot control. °Summary °· Take care of the port as told by your health   care provider. Keep the manufacturer's information card with you at all times. °· Change your dressing as told by your health care provider. °· Contact a health care provider if you have a fever or chills or if you have redness, swelling, or pain around your port insertion site. °· Keep all follow-up visits as told by your health care provider. °This information is not intended to replace advice given to you by your health care provider. Make sure you discuss any questions you have with your health care provider. °Document Revised: 08/06/2017 Document Reviewed: 08/06/2017 °Elsevier Patient Education ©  2020 Elsevier Inc. ° °

## 2019-04-15 NOTE — Progress Notes (Signed)
Pharmacist Chemotherapy Monitoring - Follow Up Assessment    I verify that I have reviewed each item in the below checklist:  . Regimen for the patient is scheduled for the appropriate day and plan matches scheduled date. Marland Kitchen Appropriate non-routine labs are ordered dependent on drug ordered. . If applicable, additional medications reviewed and ordered per protocol based on lifetime cumulative doses and/or treatment regimen.   Plan for follow-up and/or issues identified: No . I-vent associated with next due treatment: No . MD and/or nursing notified: No  Gene Hill, Gene Hill 04/15/2019 8:38 AM

## 2019-04-17 ENCOUNTER — Encounter: Payer: Self-pay | Admitting: Hematology & Oncology

## 2019-04-17 NOTE — Progress Notes (Signed)
Received voicemail from spouse regarding bill and claim to be sent to [facility] for processing.  Emailed IT consultant to contact them to follow up.

## 2019-04-21 ENCOUNTER — Other Ambulatory Visit: Payer: Self-pay | Admitting: *Deleted

## 2019-04-21 ENCOUNTER — Ambulatory Visit: Payer: PPO

## 2019-04-21 ENCOUNTER — Other Ambulatory Visit: Payer: PPO

## 2019-04-21 ENCOUNTER — Telehealth: Payer: Self-pay | Admitting: Hematology & Oncology

## 2019-04-21 DIAGNOSIS — C9000 Multiple myeloma not having achieved remission: Secondary | ICD-10-CM

## 2019-04-21 NOTE — Telephone Encounter (Signed)
Called and spoke with patient's wife regarding appointment for 3/31 being moved over to [place] [facility] schedule per Dr Marin Olp

## 2019-04-22 ENCOUNTER — Ambulatory Visit: Payer: PPO

## 2019-04-22 ENCOUNTER — Inpatient Hospital Stay: Payer: PPO

## 2019-04-22 ENCOUNTER — Inpatient Hospital Stay (HOSPITAL_BASED_OUTPATIENT_CLINIC_OR_DEPARTMENT_OTHER): Payer: PPO | Admitting: Family

## 2019-04-22 ENCOUNTER — Other Ambulatory Visit: Payer: Self-pay

## 2019-04-22 ENCOUNTER — Encounter: Payer: Self-pay | Admitting: Family

## 2019-04-22 ENCOUNTER — Telehealth: Payer: Self-pay | Admitting: Hematology & Oncology

## 2019-04-22 VITALS — BP 143/75 | HR 85 | Temp 97.1°F | Resp 18 | Ht 65.0 in | Wt 208.0 lb

## 2019-04-22 VITALS — BP 146/76 | HR 75 | Temp 97.3°F | Resp 17

## 2019-04-22 DIAGNOSIS — C9000 Multiple myeloma not having achieved remission: Secondary | ICD-10-CM

## 2019-04-22 DIAGNOSIS — D508 Other iron deficiency anemias: Secondary | ICD-10-CM

## 2019-04-22 DIAGNOSIS — Z5112 Encounter for antineoplastic immunotherapy: Secondary | ICD-10-CM | POA: Diagnosis not present

## 2019-04-22 LAB — CBC WITH DIFFERENTIAL (CANCER CENTER ONLY)
Abs Immature Granulocytes: 0.02 10*3/uL (ref 0.00–0.07)
Basophils Absolute: 0 10*3/uL (ref 0.0–0.1)
Basophils Relative: 0 %
Eosinophils Absolute: 0 10*3/uL (ref 0.0–0.5)
Eosinophils Relative: 1 %
HCT: 32.5 % — ABNORMAL LOW (ref 39.0–52.0)
Hemoglobin: 10.4 g/dL — ABNORMAL LOW (ref 13.0–17.0)
Immature Granulocytes: 1 %
Lymphocytes Relative: 35 %
Lymphs Abs: 1.3 10*3/uL (ref 0.7–4.0)
MCH: 33.1 pg (ref 26.0–34.0)
MCHC: 32 g/dL (ref 30.0–36.0)
MCV: 103.5 fL — ABNORMAL HIGH (ref 80.0–100.0)
Monocytes Absolute: 0.6 10*3/uL (ref 0.1–1.0)
Monocytes Relative: 15 %
Neutro Abs: 1.8 10*3/uL (ref 1.7–7.7)
Neutrophils Relative %: 48 %
Platelet Count: 119 10*3/uL — ABNORMAL LOW (ref 150–400)
RBC: 3.14 MIL/uL — ABNORMAL LOW (ref 4.22–5.81)
RDW: 15 % (ref 11.5–15.5)
WBC Count: 3.8 10*3/uL — ABNORMAL LOW (ref 4.0–10.5)
nRBC: 0 % (ref 0.0–0.2)

## 2019-04-22 LAB — CMP (CANCER CENTER ONLY)
ALT: 14 U/L (ref 0–44)
AST: 15 U/L (ref 15–41)
Albumin: 4.2 g/dL (ref 3.5–5.0)
Alkaline Phosphatase: 31 U/L — ABNORMAL LOW (ref 38–126)
Anion gap: 10 (ref 5–15)
BUN: 18 mg/dL (ref 8–23)
CO2: 26 mmol/L (ref 22–32)
Calcium: 9.5 mg/dL (ref 8.9–10.3)
Chloride: 104 mmol/L (ref 98–111)
Creatinine: 0.89 mg/dL (ref 0.61–1.24)
GFR, Est AFR Am: >60 mL/min (ref >60)
GFR, Estimated: >60 mL/min (ref >60)
Glucose, Bld: 112 mg/dL — ABNORMAL HIGH (ref 70–99)
Potassium: 3.8 mmol/L (ref 3.5–5.1)
Sodium: 140 mmol/L (ref 135–145)
Total Bilirubin: 0.4 mg/dL (ref 0.3–1.2)
Total Protein: 6.7 g/dL (ref 6.5–8.1)

## 2019-04-22 MED ORDER — HEPARIN SOD (PORK) LOCK FLUSH 100 UNIT/ML IV SOLN
500.0000 [IU] | Freq: Once | INTRAVENOUS | Status: DC | PRN
  Filled 2019-04-22: qty 5

## 2019-04-22 MED ORDER — SODIUM CHLORIDE 0.9 % IV SOLN
Freq: Once | INTRAVENOUS | Status: AC
  Filled 2019-04-22: qty 250

## 2019-04-22 MED ORDER — DEXTROSE 5 % IV SOLN
54.5000 mg/m2 | INTRAVENOUS | Status: DC
  Administered 2019-04-22: 120 mg via INTRAVENOUS
  Filled 2019-04-22: qty 60

## 2019-04-22 MED ORDER — DEXAMETHASONE SODIUM PHOSPHATE 10 MG/ML IJ SOLN
INTRAMUSCULAR | Status: AC
  Filled 2019-04-22: qty 1

## 2019-04-22 MED ORDER — SODIUM CHLORIDE 0.9% FLUSH
10.0000 mL | INTRAVENOUS | Status: DC | PRN
  Filled 2019-04-22: qty 10

## 2019-04-22 MED ORDER — PROCHLORPERAZINE MALEATE 10 MG PO TABS
10.0000 mg | ORAL_TABLET | Freq: Once | ORAL | Status: AC
  Administered 2019-04-22: 10 mg via ORAL

## 2019-04-22 MED ORDER — PROCHLORPERAZINE MALEATE 10 MG PO TABS
ORAL_TABLET | ORAL | Status: AC
  Filled 2019-04-22: qty 1

## 2019-04-22 MED ORDER — DEXAMETHASONE SODIUM PHOSPHATE 10 MG/ML IJ SOLN
10.0000 mg | Freq: Once | INTRAMUSCULAR | Status: AC
  Administered 2019-04-22: 10 mg via INTRAVENOUS

## 2019-04-22 NOTE — Telephone Encounter (Signed)
Called and advised patient of appointments added per 3/31 los °

## 2019-04-22 NOTE — Progress Notes (Signed)
Hematology and Oncology Follow Up Visit  PACER WEHR [id number] 07-07-1940 79 y.o. 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  M-spike earlier this month was 0.3, IgA was 1,157 mg/dL and kappa light chains 313.5 mg/L.  He has had some issues with nausea and fatigue with his new treatment regimen. He states that ginger helps with the nausea.  Since starting his initial treatment he has had issues with constipation and sometimes diarrhea after each cycle. He just lets each episode run its course without intervention.  He will sometimes have a little blood in his stool with the diarrhea due to hemorrhoids. No other blood loss has been noted.  He has chronic back issues due to a pinched nerve in his neck and herniated disc in his back. He has intermittent numbness and tingling in his hands and feet with this.  He is in PT through the week to work on his back left shoulder and right knee.  His right knee incision has healed nicely.  He has occasional episodes of dizziness.  No falls or syncopal episodes to report.  No swelling in his extremities at this time.  He has been seeing urologist Dr. Vikki Hill about delayed emptying. He is currently taking Flomax and Ditropan XL. He had a Ct scan which showed a cyst that appeared to be pressing on his prostate which is felt to possibly be the cause. He is scheduled for another CT scan in May and states this may require surgical intervention.  No fever, chills, cough, rash, chest pain, palpitations, abdominal pain or changes in bowel  habits.  He has some SOB with activity and will take a break to rest when needed.  His appetite comes and goes with the nausea. He is doing his best to stay hydrated. His weight is stable.   ECOG Performance Status: 1 - Symptomatic but completely ambulatory  Medications:  Allergies as of 04/22/2019   No Known Allergies     Medication List       Accurate as of April 22, 2019  9:09 AM. If you have any questions, ask your nurse or doctor.        acetaminophen 500 MG tablet Commonly known as: TYLENOL Take 1,000 mg by mouth 3 (three) times daily as needed.   aspirin 325 MG tablet Take 325 mg by mouth daily.   atorvastatin 10 MG tablet Commonly known as: LIPITOR Take 1 tablet (10 mg total) by mouth at bedtime.   b complex vitamins tablet Take 1 tablet by mouth daily.   calcium carbonate 1500 (600 Ca) MG Tabs tablet Commonly known as: OSCAL Take 600 mg of elemental calcium by mouth daily with breakfast.   DARZALEX IV Inject into the vein every 28 (twenty-eight) days.   dexamethasone 2 MG tablet Commonly known as: DECADRON TAKE 2 TABLETS BY MOUTH DAY AFTER CHEMO THEN 1 TABLET EVERY DAY FOR 4 DAYS What changed:   how much to take  how to take this  when to take this  additional  instructions   Fish Oil 1200 MG Caps Take 1,200 mg by mouth daily.   fluticasone 50 MCG/ACT nasal spray Commonly known as: FLONASE Place 1 spray into both nostrils at bedtime.   methocarbamol 500 MG tablet Commonly known as: ROBAXIN Take 1 tablet (500 mg total) by mouth 3 (three) times daily as needed for muscle spasms.   metoprolol tartrate 50 MG tablet Commonly known as: LOPRESSOR Take 1 tablet (50 mg total) by mouth 2 (two) times daily.   multivitamin tablet Take 1 tablet by mouth daily.   oxybutynin 10 MG 24 hr tablet Commonly known as: DITROPAN-XL Take 10 mg by mouth daily.   pantoprazole 40 MG tablet Commonly known as: PROTONIX Take 1 tablet (40 mg total) by mouth at  bedtime.   PROBIOTIC PO Take 1 tablet by mouth daily.   tamsulosin 0.4 MG Caps capsule Commonly known as: FLOMAX Take 0.4 mg by mouth daily.   traMADol 50 MG tablet Commonly known as: ULTRAM Take 1 tablet (50 mg total) by mouth every 6 (six) hours as needed.   VITAMIN B-12 PO Take 5,000 mcg by mouth daily.       Allergies: No Known Allergies  Past Medical History, Surgical history, Social history, and Family History were reviewed and updated.  Review of Systems: All other 10 point review of systems is negative.   Physical Exam:  vitals were not taken for this visit.   Wt Readings from Last 3 Encounters:  03/31/19 209 lb 2 oz (94.9 kg)  03/31/19 209 lb 2 oz (94.9 kg)  03/26/19 209 lb (94.8 kg)    Ocular: Sclerae unicteric, pupils equal, round and reactive to light Ear-nose-throat: Oropharynx clear, dentition fair Lymphatic: No cervical or supraclavicular adenopathy Lungs no rales or rhonchi, good excursion bilaterally Heart regular rate and rhythm, no murmur appreciated Abd soft, nontender, positive bowel sounds, no liver or spleen tip palpated on exam, no fluid wave  MSK no focal spinal tenderness, no joint edema Neuro: non-focal, well-oriented, appropriate affect Breasts: Deferred   Lab Results  Component Value Date   WBC 3.8 (L) 04/22/2019   HGB 10.4 (L) 04/22/2019   HCT 32.5 (L) 04/22/2019   MCV 103.5 (H) 04/22/2019   PLT 119 (L) 04/22/2019   Lab Results  Component Value Date   FERRITIN 69 03/26/2019   IRON 82 03/26/2019   TIBC 320 03/26/2019   UIBC 238 03/26/2019   IRONPCTSAT 26 03/26/2019   Lab Results  Component Value Date   RETICCTPCT 2.1 02/25/2019   RBC 3.14 (L) 04/22/2019   Lab Results  Component Value Date   KPAFRELGTCHN 313.5 (H) 04/07/2019   LAMBDASER 2.1 (L) 04/07/2019   KAPLAMBRATIO 149.29 (H) 04/07/2019   Lab Results  Component Value Date   IGGSERUM 134 (L) 04/07/2019   IGGSERUM 145 (L) 04/07/2019   IGA 1,157 (H) 04/07/2019     IGA 1,218 (H) 04/07/2019   IGMSERUM <5 (L) 04/07/2019   IGMSERUM <5 (L) 04/07/2019   Lab Results  Component Value Date   TOTALPROTELP 6.4 04/07/2019   ALBUMINELP 3.5 04/07/2019   A1GS 0.2 04/07/2019   A2GS 1.0 04/07/2019   BETS 1.4 (H) 04/07/2019   BETA2SER 2.0 (H) 12/08/2014   GAMS 0.3 (L) 04/07/2019   MSPIKE 0.3 (H) 04/07/2019   SPEI Comment 09/02/2017     Chemistry      Component Value Date/Time   NA 140 04/14/2019 0856   NA 141 01/21/2017 0741   NA 139 06/14/2016 1246   K  3.9 04/14/2019 0856   K 4.0 01/21/2017 0741   K 3.8 06/14/2016 1246   CL 104 04/14/2019 0856   CL 103 01/21/2017 0741   CO2 27 04/14/2019 0856   CO2 30 01/21/2017 0741   CO2 24 06/14/2016 1246   BUN 14 04/14/2019 0856   BUN 20 01/21/2017 0741   BUN 15.0 06/14/2016 1246   CREATININE 0.90 04/14/2019 0856   CREATININE 1.0 01/21/2017 0741   CREATININE 0.9 06/14/2016 1246      Component Value Date/Time   CALCIUM 9.5 04/14/2019 0856   CALCIUM 9.0 01/21/2017 0741   CALCIUM 9.6 06/14/2016 1246   ALKPHOS 32 (L) 04/14/2019 0856   ALKPHOS 37 01/21/2017 0741   ALKPHOS 54 06/14/2016 1246   AST 15 04/14/2019 0856   AST 25 06/14/2016 1246   ALT 15 04/14/2019 0856   ALT 29 01/21/2017 0741   ALT 21 06/14/2016 1246   BILITOT 0.3 04/14/2019 0856   BILITOT 0.37 06/14/2016 1246       Impression and Plan: Gene Hill is a very pleasant 80 yo caucasian gentleman with IgA kappa myeloma, high risk cytogenetics with a 17p- abnormality.  He is tolerating Kyprolis and Daratumumab well. We will proceed with treatment today as planned.  Myeloma studies will be drawn again at next visit.  We will see him again in another 2 weeks.  They will contact our office with any questions or concerns. We can certainly see him sooner if needed.   [name], NP 3/31/20219:09 AM

## 2019-04-29 ENCOUNTER — Ambulatory Visit: Payer: PPO

## 2019-04-29 ENCOUNTER — Other Ambulatory Visit: Payer: PPO

## 2019-04-29 NOTE — Progress Notes (Signed)
Pharmacist Chemotherapy Monitoring - Follow Up Assessment    I verify that I have reviewed each item in the below checklist:  . Regimen for the patient is scheduled for the appropriate day and plan matches scheduled date. Marland Kitchen Appropriate non-routine labs are ordered dependent on drug ordered. . If applicable, additional medications reviewed and ordered per protocol based on lifetime cumulative doses and/or treatment regimen.   Plan for follow-up and/or issues identified: No . I-vent associated with next due treatment: No . MD and/or nursing notified: No  Romualdo Bolk [facility] 04/29/2019 8:18 AM

## 2019-05-05 ENCOUNTER — Other Ambulatory Visit: Payer: PPO

## 2019-05-05 ENCOUNTER — Ambulatory Visit: Payer: PPO

## 2019-05-05 NOTE — Progress Notes (Signed)
Pharmacist Chemotherapy Monitoring - Follow Up Assessment    I verify that I have reviewed each item in the below checklist:  . Regimen for the patient is scheduled for the appropriate day and plan matches scheduled date. Marland Kitchen Appropriate non-routine labs are ordered dependent on drug ordered. . If applicable, additional medications reviewed and ordered per protocol based on lifetime cumulative doses and/or treatment regimen.   Plan for follow-up and/or issues identified: No . I-vent associated with next due treatment: No . MD and/or nursing notified: No  Orean Giarratano, [name] 05/05/2019 2:36 PM

## 2019-05-06 ENCOUNTER — Inpatient Hospital Stay: Payer: PPO

## 2019-05-06 ENCOUNTER — Other Ambulatory Visit: Payer: PPO

## 2019-05-06 ENCOUNTER — Ambulatory Visit: Payer: PPO

## 2019-05-06 ENCOUNTER — Inpatient Hospital Stay: Payer: PPO | Attending: Hematology & Oncology

## 2019-05-06 ENCOUNTER — Inpatient Hospital Stay (HOSPITAL_BASED_OUTPATIENT_CLINIC_OR_DEPARTMENT_OTHER): Payer: PPO | Admitting: Hematology & Oncology

## 2019-05-06 ENCOUNTER — Encounter: Payer: Self-pay | Admitting: Hematology & Oncology

## 2019-05-06 ENCOUNTER — Other Ambulatory Visit: Payer: Self-pay

## 2019-05-06 VITALS — BP 141/69 | HR 71 | Temp 97.5°F | Resp 18 | Ht 65.0 in | Wt 203.0 lb

## 2019-05-06 VITALS — BP 141/69 | HR 71 | Temp 97.5°F | Resp 18 | Ht 65.0 in | Wt 203.8 lb

## 2019-05-06 DIAGNOSIS — Z95828 Presence of other vascular implants and grafts: Secondary | ICD-10-CM

## 2019-05-06 DIAGNOSIS — Z5112 Encounter for antineoplastic immunotherapy: Secondary | ICD-10-CM | POA: Diagnosis not present

## 2019-05-06 DIAGNOSIS — C9000 Multiple myeloma not having achieved remission: Secondary | ICD-10-CM

## 2019-05-06 DIAGNOSIS — R5383 Other fatigue: Secondary | ICD-10-CM | POA: Diagnosis not present

## 2019-05-06 DIAGNOSIS — C9002 Multiple myeloma in relapse: Secondary | ICD-10-CM | POA: Diagnosis not present

## 2019-05-06 DIAGNOSIS — Z79899 Other long term (current) drug therapy: Secondary | ICD-10-CM | POA: Diagnosis not present

## 2019-05-06 DIAGNOSIS — D508 Other iron deficiency anemias: Secondary | ICD-10-CM

## 2019-05-06 LAB — CBC WITH DIFFERENTIAL (CANCER CENTER ONLY)
Abs Immature Granulocytes: 0.03 10*3/uL (ref 0.00–0.07)
Basophils Absolute: 0 10*3/uL (ref 0.0–0.1)
Basophils Relative: 1 %
Eosinophils Absolute: 0 10*3/uL (ref 0.0–0.5)
Eosinophils Relative: 1 %
HCT: 28.9 % — ABNORMAL LOW (ref 39.0–52.0)
Hemoglobin: 9.2 g/dL — ABNORMAL LOW (ref 13.0–17.0)
Immature Granulocytes: 1 %
Lymphocytes Relative: 37 %
Lymphs Abs: 1.4 10*3/uL (ref 0.7–4.0)
MCH: 32.9 pg (ref 26.0–34.0)
MCHC: 31.8 g/dL (ref 30.0–36.0)
MCV: 103.2 fL — ABNORMAL HIGH (ref 80.0–100.0)
Monocytes Absolute: 0.6 10*3/uL (ref 0.1–1.0)
Monocytes Relative: 16 %
Neutro Abs: 1.6 10*3/uL — ABNORMAL LOW (ref 1.7–7.7)
Neutrophils Relative %: 44 %
Platelet Count: 92 10*3/uL — ABNORMAL LOW (ref 150–400)
RBC: 2.8 MIL/uL — ABNORMAL LOW (ref 4.22–5.81)
RDW: 15 % (ref 11.5–15.5)
WBC Count: 3.6 10*3/uL — ABNORMAL LOW (ref 4.0–10.5)
nRBC: 0 % (ref 0.0–0.2)

## 2019-05-06 LAB — CMP (CANCER CENTER ONLY)
ALT: 18 U/L (ref 0–44)
AST: 19 U/L (ref 15–41)
Albumin: 4.1 g/dL (ref 3.5–5.0)
Alkaline Phosphatase: 31 U/L — ABNORMAL LOW (ref 38–126)
Anion gap: 8 (ref 5–15)
BUN: 14 mg/dL (ref 8–23)
CO2: 28 mmol/L (ref 22–32)
Calcium: 9.4 mg/dL (ref 8.9–10.3)
Chloride: 105 mmol/L (ref 98–111)
Creatinine: 0.86 mg/dL (ref 0.61–1.24)
GFR, Est AFR Am: >60 mL/min (ref >60)
GFR, Estimated: >60 mL/min (ref >60)
Glucose, Bld: 104 mg/dL — ABNORMAL HIGH (ref 70–99)
Potassium: 3.9 mmol/L (ref 3.5–5.1)
Sodium: 141 mmol/L (ref 135–145)
Total Bilirubin: 0.4 mg/dL (ref 0.3–1.2)
Total Protein: 6.4 g/dL — ABNORMAL LOW (ref 6.5–8.1)

## 2019-05-06 MED ORDER — ALTEPLASE 2 MG IJ SOLR
2.0000 mg | Freq: Once | INTRAMUSCULAR | Status: AC
  Administered 2019-05-06: 2 mg
  Filled 2019-05-06: qty 2

## 2019-05-06 MED ORDER — ALTEPLASE 2 MG IJ SOLR
INTRAMUSCULAR | Status: AC
  Filled 2019-05-06: qty 2

## 2019-05-06 MED ORDER — DARATUMUMAB-HYALURONIDASE-FIHJ 1800-30000 MG-UT/15ML ~~LOC~~ SOLN
1800.0000 mg | Freq: Once | SUBCUTANEOUS | Status: AC
  Administered 2019-05-06: 1800 mg via SUBCUTANEOUS
  Filled 2019-05-06: qty 15

## 2019-05-06 MED ORDER — PROCHLORPERAZINE MALEATE 10 MG PO TABS
ORAL_TABLET | ORAL | Status: AC
  Filled 2019-05-06: qty 1

## 2019-05-06 MED ORDER — ACETAMINOPHEN 325 MG PO TABS
650.0000 mg | ORAL_TABLET | Freq: Once | ORAL | Status: AC
  Administered 2019-05-06: 650 mg via ORAL

## 2019-05-06 MED ORDER — SODIUM CHLORIDE 0.9 % IV SOLN
Freq: Once | INTRAVENOUS | Status: DC
  Filled 2019-05-06: qty 250

## 2019-05-06 MED ORDER — PROCHLORPERAZINE MALEATE 10 MG PO TABS
10.0000 mg | ORAL_TABLET | Freq: Once | ORAL | Status: AC
  Administered 2019-05-06: 10 mg via ORAL

## 2019-05-06 MED ORDER — SODIUM CHLORIDE 0.9% FLUSH
10.0000 mL | INTRAVENOUS | Status: DC | PRN
  Administered 2019-05-06: 10 mL
  Filled 2019-05-06: qty 10

## 2019-05-06 MED ORDER — STERILE WATER FOR INJECTION IJ SOLN
INTRAMUSCULAR | Status: AC
  Filled 2019-05-06: qty 10

## 2019-05-06 MED ORDER — DIPHENHYDRAMINE HCL 25 MG PO CAPS
50.0000 mg | ORAL_CAPSULE | Freq: Once | ORAL | Status: AC
  Administered 2019-05-06: 50 mg via ORAL

## 2019-05-06 MED ORDER — DIPHENHYDRAMINE HCL 25 MG PO CAPS
ORAL_CAPSULE | ORAL | Status: AC
  Filled 2019-05-06: qty 2

## 2019-05-06 MED ORDER — ACETAMINOPHEN 325 MG PO TABS
ORAL_TABLET | ORAL | Status: AC
  Filled 2019-05-06: qty 2

## 2019-05-06 MED ORDER — SODIUM CHLORIDE 0.9 % IV SOLN
Freq: Once | INTRAVENOUS | Status: AC
  Filled 2019-05-06: qty 250

## 2019-05-06 MED ORDER — DEXTROSE 5 % IV SOLN
54.5000 mg/m2 | INTRAVENOUS | Status: DC
  Administered 2019-05-06: 120 mg via INTRAVENOUS
  Filled 2019-05-06: qty 60

## 2019-05-06 MED ORDER — DEXAMETHASONE 4 MG PO TABS
ORAL_TABLET | ORAL | Status: AC
  Filled 2019-05-06: qty 5

## 2019-05-06 MED ORDER — HEPARIN SOD (PORK) LOCK FLUSH 100 UNIT/ML IV SOLN
500.0000 [IU] | Freq: Once | INTRAVENOUS | Status: AC | PRN
  Administered 2019-05-06: 500 [IU]
  Filled 2019-05-06: qty 5

## 2019-05-06 MED ORDER — DEXAMETHASONE 4 MG PO TABS
20.0000 mg | ORAL_TABLET | Freq: Once | ORAL | Status: AC
  Administered 2019-05-06: 20 mg via ORAL

## 2019-05-06 NOTE — Progress Notes (Signed)
1035 alteplase aspirated and excellent blood return, aspirated 10 cc waste and  labs drawn from port.

## 2019-05-06 NOTE — Progress Notes (Signed)
Cbc and cmet reviewed by MD, VO"ok to treat despite counts"

## 2019-05-06 NOTE — Addendum Note (Signed)
Addended by: Burney Gauze R on: 05/06/2019 12:13 PM   Modules accepted: Orders

## 2019-05-06 NOTE — Progress Notes (Signed)
Hematology and Oncology Follow Up Visit  Gene Hill [id number] 1940/02/12 79 y.o. 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  He is doing quite nicely right now.  We will have out of the carfilzomib to the daratumumab.  He has tolerated this pretty well.  He does have a little bit of fatigue.  He is able to do his homework.  Thankfully, he had a lot of volunteers come and help plan his garden.  His right knee is doing okay.  He does have a cane that he uses.  It is still a bit too early to see how his myeloma is responding.  I have to believe that this is responding.  He has had no fever.  He has had no cough.  He has had no bleeding.  There is been no leg swelling.  He has had no rashes.  He had a couple small ecchymoses which I am sure is by from steroids and prior to chemotherapy.  Overall, I would say his performance status is ECOG 1.  Medications:  Allergies as of 05/06/2019   No Known Allergies     Medication List       Accurate as of May 06, 2019 10:39 AM. If you have any questions, ask your nurse or doctor.        acetaminophen 500 MG tablet Commonly known as: TYLENOL Take 1,000 mg by mouth 3 (three) times daily as needed.   aspirin 325 MG tablet Take 325 mg by mouth daily.   atorvastatin 10 MG tablet Commonly known as: LIPITOR Take 1 tablet (10 mg total) by mouth at bedtime.   b complex vitamins tablet Take 1 tablet by mouth daily.   calcium carbonate 1500 (600 Ca) MG Tabs tablet Commonly known as: OSCAL Take 600 mg of elemental calcium by mouth daily with breakfast.   dexamethasone 2 MG  tablet Commonly known as: DECADRON TAKE 2 TABLETS BY MOUTH DAY AFTER CHEMO THEN 1 TABLET EVERY DAY FOR 4 DAYS What changed:   how much to take  how to take this  when to take this  additional instructions   Fish Oil 1200 MG Caps Take 1,200 mg by mouth daily.   fluticasone 50 MCG/ACT nasal spray Commonly known as: FLONASE Place 1 spray into both nostrils at bedtime.   methocarbamol 500 MG tablet Commonly known as: ROBAXIN Take 1 tablet (500 mg total) by mouth 3 (three) times daily as needed for muscle spasms.   metoprolol tartrate 50 MG tablet Commonly known as: LOPRESSOR Take 1 tablet (50 mg total) by mouth 2 (two) times daily.   multivitamin tablet Take 1 tablet by mouth daily.   oxybutynin 5 MG 24 hr tablet Commonly known as: DITROPAN-XL Take 5 mg by mouth daily.   pantoprazole 40 MG tablet Commonly known as: PROTONIX Take 1 tablet (40 mg total) by mouth at bedtime.   PROBIOTIC PO Take 1 tablet by mouth daily.   tamsulosin 0.4 MG Caps capsule Commonly known as: FLOMAX Take 0.4 mg by mouth daily.  traMADol 50 MG tablet Commonly known as: ULTRAM Take 1 tablet (50 mg total) by mouth every 6 (six) hours as needed.   VITAMIN B-12 PO Take 5,000 mcg by mouth daily.       Allergies: No Known Allergies  Past Medical History, Surgical history, Social history, and Family History were reviewed and updated.  Review of Systems: Review of Systems  Constitutional: Negative.   HENT: Negative.   Eyes: Negative.   Respiratory: Negative.   Cardiovascular: Negative.   Gastrointestinal: Negative.   Genitourinary: Negative.   Musculoskeletal: Positive for back pain and joint pain.  Skin: Negative.   Neurological: Negative.   Endo/Heme/Allergies: Negative.   Psychiatric/Behavioral: Negative.       Physical Exam:  height is 5\' 5"  (1.651 m) and weight is 203 lb (92.1 kg). His temporal temperature is 97.5 F (36.4 C) (abnormal). His blood pressure is 141/69  (abnormal) and his pulse is 71. His respiration is 18 and oxygen saturation is 98%.   Wt Readings from Last 3 Encounters:  05/06/19 203 lb (92.1 kg)  05/06/19 203 lb 12 oz (92.4 kg)  04/22/19 208 lb (94.3 kg)  Is  Physical Exam Vitals reviewed.  HENT:     Head: Normocephalic and atraumatic.  Eyes:     Pupils: Pupils are equal, round, and reactive to light.  Cardiovascular:     Rate and Rhythm: Normal rate and regular rhythm.     Heart sounds: Normal heart sounds.  Pulmonary:     Effort: Pulmonary effort is normal.     Breath sounds: Normal breath sounds.  Abdominal:     General: Bowel sounds are normal.     Palpations: Abdomen is soft.  Musculoskeletal:        General: No tenderness or deformity. Normal range of motion.     Cervical back: Normal range of motion.     Comments: His extremities shows the surgical scar with a dressing on the right knee.  There is no swelling.  There is no obvious erythema.  Lymphadenopathy:     Cervical: No cervical adenopathy.  Skin:    General: Skin is warm and dry.     Findings: No erythema or rash.  Neurological:     Mental Status: He is alert and oriented to person, place, and time.  Psychiatric:        Behavior: Behavior normal.        Thought Content: Thought content normal.        Judgment: Judgment normal.      Lab Results  Component Value Date   WBC 3.8 (L) 04/22/2019   HGB 10.4 (L) 04/22/2019   HCT 32.5 (L) 04/22/2019   MCV 103.5 (H) 04/22/2019   PLT 119 (L) 04/22/2019   Lab Results  Component Value Date   FERRITIN 69 03/26/2019   IRON 82 03/26/2019   TIBC 320 03/26/2019   UIBC 238 03/26/2019   IRONPCTSAT 26 03/26/2019   Lab Results  Component Value Date   RETICCTPCT 2.1 02/25/2019   RBC 3.14 (L) 04/22/2019   Lab Results  Component Value Date   KPAFRELGTCHN 313.5 (H) 04/07/2019   LAMBDASER 2.1 (L) 04/07/2019   KAPLAMBRATIO 149.29 (H) 04/07/2019   Lab Results  Component Value Date   IGGSERUM 134 (L)  04/07/2019   IGGSERUM 145 (L) 04/07/2019   IGA 1,157 (H) 04/07/2019   IGA 1,218 (H) 04/07/2019   IGMSERUM <5 (L) 04/07/2019   IGMSERUM <5 (L) 04/07/2019   Lab Results  Component  Value Date   TOTALPROTELP 6.4 04/07/2019   ALBUMINELP 3.5 04/07/2019   A1GS 0.2 04/07/2019   A2GS 1.0 04/07/2019   BETS 1.4 (H) 04/07/2019   BETA2SER 2.0 (H) 12/08/2014   GAMS 0.3 (L) 04/07/2019   MSPIKE 0.3 (H) 04/07/2019   SPEI Comment 09/02/2017     Chemistry      Component Value Date/Time   NA 140 04/22/2019 0847   NA 141 01/21/2017 0741   NA 139 06/14/2016 1246   K 3.8 04/22/2019 0847   K 4.0 01/21/2017 0741   K 3.8 06/14/2016 1246   CL 104 04/22/2019 0847   CL 103 01/21/2017 0741   CO2 26 04/22/2019 0847   CO2 30 01/21/2017 0741   CO2 24 06/14/2016 1246   BUN 18 04/22/2019 0847   BUN 20 01/21/2017 0741   BUN 15.0 06/14/2016 1246   CREATININE 0.89 04/22/2019 0847   CREATININE 1.0 01/21/2017 0741   CREATININE 0.9 06/14/2016 1246      Component Value Date/Time   CALCIUM 9.5 04/22/2019 0847   CALCIUM 9.0 01/21/2017 0741   CALCIUM 9.6 06/14/2016 1246   ALKPHOS 31 (L) 04/22/2019 0847   ALKPHOS 37 01/21/2017 0741   ALKPHOS 54 06/14/2016 1246   AST 15 04/22/2019 0847   AST 25 06/14/2016 1246   ALT 14 04/22/2019 0847   ALT 29 01/21/2017 0741   ALT 21 06/14/2016 1246   BILITOT 0.4 04/22/2019 0847   BILITOT 0.37 06/14/2016 1246      Impression and Plan: Mr. Pitones is a very pleasant 79 yo caucasian gentleman with history of IgA kappa myeloma. He had high risk cytogenetics with a 17p- abnormality.   I think that the combination of the daratumumab along with carfilzomib will work for him.  We will go ahead with his second full cycle of the carfilzomib with the daratumumab today.  I will plan to get him back in another month for his third cycle.  At that time, we will check the myeloma studies.         [name], MD 4/14/202110:39 AM

## 2019-05-07 LAB — FERRITIN: Ferritin: 128 ng/mL (ref 24–336)

## 2019-05-07 LAB — IGG, IGA, IGM
IgA: 1295 mg/dL — ABNORMAL HIGH (ref 61–437)
IgG (Immunoglobin G), Serum: 102 mg/dL — ABNORMAL LOW (ref 603–1613)
IgM (Immunoglobulin M), Srm: <5 mg/dL — ABNORMAL LOW (ref 15–143)

## 2019-05-07 LAB — IRON AND TIBC
Iron: 99 ug/dL (ref 42–163)
Saturation Ratios: 32 % (ref 20–55)
TIBC: 308 ug/dL (ref 202–409)
UIBC: 208 ug/dL (ref 117–376)

## 2019-05-07 LAB — KAPPA/LAMBDA LIGHT CHAINS
Kappa free light chain: 846.9 mg/L — ABNORMAL HIGH (ref 3.3–19.4)
Kappa, lambda light chain ratio: 338.76 — ABNORMAL HIGH (ref 0.26–1.65)
Lambda free light chains: 2.5 mg/L — ABNORMAL LOW (ref 5.7–26.3)

## 2019-05-11 ENCOUNTER — Other Ambulatory Visit: Payer: Self-pay

## 2019-05-11 DIAGNOSIS — C9000 Multiple myeloma not having achieved remission: Secondary | ICD-10-CM

## 2019-05-11 LAB — PROTEIN ELECTROPHORESIS, SERUM, WITH REFLEX
A/G Ratio: 1.2 (ref 0.7–1.7)
Albumin ELP: 3.5 g/dL (ref 2.9–4.4)
Alpha-1-Globulin: 0.2 g/dL (ref 0.0–0.4)
Alpha-2-Globulin: 0.8 g/dL (ref 0.4–1.0)
Beta Globulin: 1.7 g/dL — ABNORMAL HIGH (ref 0.7–1.3)
Gamma Globulin: 0.3 g/dL — ABNORMAL LOW (ref 0.4–1.8)
Globulin, Total: 3 g/dL (ref 2.2–3.9)
M-Spike, %: 0.7 g/dL — ABNORMAL HIGH
SPEP Interpretation: 0
Total Protein ELP: 6.5 g/dL (ref 6.0–8.5)

## 2019-05-11 LAB — IMMUNOFIXATION REFLEX, SERUM
IgA: 1373 mg/dL — ABNORMAL HIGH (ref 61–437)
IgG (Immunoglobin G), Serum: 114 mg/dL — ABNORMAL LOW (ref 603–1613)
IgM (Immunoglobulin M), Srm: <5 mg/dL — ABNORMAL LOW (ref 15–143)

## 2019-05-12 ENCOUNTER — Telehealth: Payer: Self-pay | Admitting: Hematology & Oncology

## 2019-05-12 ENCOUNTER — Inpatient Hospital Stay: Payer: PPO

## 2019-05-12 ENCOUNTER — Other Ambulatory Visit: Payer: Self-pay

## 2019-05-12 DIAGNOSIS — C9000 Multiple myeloma not having achieved remission: Secondary | ICD-10-CM

## 2019-05-12 DIAGNOSIS — Z5112 Encounter for antineoplastic immunotherapy: Secondary | ICD-10-CM | POA: Diagnosis not present

## 2019-05-12 LAB — CBC WITH DIFFERENTIAL (CANCER CENTER ONLY)
Abs Immature Granulocytes: 0.01 10*3/uL (ref 0.00–0.07)
Basophils Absolute: 0 10*3/uL (ref 0.0–0.1)
Basophils Relative: 0 %
Eosinophils Absolute: 0 10*3/uL (ref 0.0–0.5)
Eosinophils Relative: 1 %
HCT: 30.5 % — ABNORMAL LOW (ref 39.0–52.0)
Hemoglobin: 9.9 g/dL — ABNORMAL LOW (ref 13.0–17.0)
Immature Granulocytes: 0 %
Lymphocytes Relative: 44 %
Lymphs Abs: 1.6 10*3/uL (ref 0.7–4.0)
MCH: 33 pg (ref 26.0–34.0)
MCHC: 32.5 g/dL (ref 30.0–36.0)
MCV: 101.7 fL — ABNORMAL HIGH (ref 80.0–100.0)
Monocytes Absolute: 0.5 10*3/uL (ref 0.1–1.0)
Monocytes Relative: 16 %
Neutro Abs: 1.3 10*3/uL — ABNORMAL LOW (ref 1.7–7.7)
Neutrophils Relative %: 39 %
Platelet Count: 80 10*3/uL — ABNORMAL LOW (ref 150–400)
RBC: 3 MIL/uL — ABNORMAL LOW (ref 4.22–5.81)
RDW: 14.8 % (ref 11.5–15.5)
WBC Count: 3.5 10*3/uL — ABNORMAL LOW (ref 4.0–10.5)
nRBC: 0 % (ref 0.0–0.2)

## 2019-05-12 LAB — CMP (CANCER CENTER ONLY)
ALT: 16 U/L (ref 0–44)
AST: 14 U/L — ABNORMAL LOW (ref 15–41)
Albumin: 4.2 g/dL (ref 3.5–5.0)
Alkaline Phosphatase: 31 U/L — ABNORMAL LOW (ref 38–126)
Anion gap: 11 (ref 5–15)
BUN: 26 mg/dL — ABNORMAL HIGH (ref 8–23)
CO2: 27 mmol/L (ref 22–32)
Calcium: 9.8 mg/dL (ref 8.9–10.3)
Chloride: 102 mmol/L (ref 98–111)
Creatinine: 1.07 mg/dL (ref 0.61–1.24)
GFR, Est AFR Am: >60 mL/min (ref >60)
GFR, Estimated: >60 mL/min (ref >60)
Glucose, Bld: 100 mg/dL — ABNORMAL HIGH (ref 70–99)
Potassium: 3.8 mmol/L (ref 3.5–5.1)
Sodium: 140 mmol/L (ref 135–145)
Total Bilirubin: 0.4 mg/dL (ref 0.3–1.2)
Total Protein: 7 g/dL (ref 6.5–8.1)

## 2019-05-12 MED ORDER — DEXTROSE 5 % IV SOLN
54.5000 mg/m2 | INTRAVENOUS | Status: DC
  Administered 2019-05-12: 120 mg via INTRAVENOUS
  Filled 2019-05-12: qty 60

## 2019-05-12 MED ORDER — PROCHLORPERAZINE MALEATE 10 MG PO TABS
ORAL_TABLET | ORAL | Status: AC
  Filled 2019-05-12: qty 1

## 2019-05-12 MED ORDER — PROCHLORPERAZINE MALEATE 10 MG PO TABS
10.0000 mg | ORAL_TABLET | Freq: Once | ORAL | Status: AC
  Administered 2019-05-12: 10 mg via ORAL

## 2019-05-12 MED ORDER — SODIUM CHLORIDE 0.9 % IV SOLN
10.0000 mg | Freq: Once | INTRAVENOUS | Status: AC
  Administered 2019-05-12: 10 mg via INTRAVENOUS
  Filled 2019-05-12: qty 10

## 2019-05-12 MED ORDER — SODIUM CHLORIDE 0.9 % IV SOLN
Freq: Once | INTRAVENOUS | Status: DC
  Filled 2019-05-12: qty 250

## 2019-05-12 MED ORDER — DEXAMETHASONE SODIUM PHOSPHATE 10 MG/ML IJ SOLN: 10.0000 mg | Freq: Once | INTRAMUSCULAR | Status: DC

## 2019-05-12 MED ORDER — SODIUM CHLORIDE 0.9 % IV SOLN
Freq: Once | INTRAVENOUS | Status: AC
  Filled 2019-05-12: qty 250

## 2019-05-12 MED ORDER — HEPARIN SOD (PORK) LOCK FLUSH 100 UNIT/ML IV SOLN
500.0000 [IU] | Freq: Once | INTRAVENOUS | Status: AC | PRN
  Administered 2019-05-12: 13:00:00 500 [IU]
  Filled 2019-05-12: qty 5

## 2019-05-12 MED ORDER — SODIUM CHLORIDE 0.9% FLUSH
10.0000 mL | INTRAVENOUS | Status: DC | PRN
  Administered 2019-05-12: 10 mL
  Filled 2019-05-12: qty 10

## 2019-05-12 NOTE — Telephone Encounter (Signed)
Appointment scheduled and patient was given updated schedule per 4/20 sch msg

## 2019-05-12 NOTE — Progress Notes (Signed)
Per dr ennever okay to treat today despite labs °

## 2019-05-12 NOTE — Patient Instructions (Signed)
Implanted Port Insertion, Care After °This sheet gives you information about how to care for yourself after your procedure. Your health care provider may also give you more specific instructions. If you have problems or questions, contact your health care provider. °What can I expect after the procedure? °After the procedure, it is common to have: °· Discomfort at the port insertion site. °· Bruising on the skin over the port. This should improve over 3-4 days. °Follow these instructions at home: °Port care °· After your port is placed, you will get a manufacturer's information card. The card has information about your port. Keep this card with you at all times. °· Take care of the port as told by your health care provider. Ask your health care provider if you or a family member can get training for taking care of the port at home. A home health care nurse may also take care of the port. °· Make sure to remember what type of port you have. °Incision care ° °  ° °· Follow instructions from your health care provider about how to take care of your port insertion site. Make sure you: °? Wash your hands with soap and water before and after you change your bandage (dressing). If soap and water are not available, use hand sanitizer. °? Change your dressing as told by your health care provider. °? Leave stitches (sutures), skin glue, or adhesive strips in place. These skin closures may need to stay in place for 2 weeks or longer. If adhesive strip edges start to loosen and curl up, you may trim the loose edges. Do not remove adhesive strips completely unless your health care provider tells you to do that. °· Check your port insertion site every day for signs of infection. Check for: °? Redness, swelling, or pain. °? Fluid or blood. °? Warmth. °? Pus or a bad smell. °Activity °· Return to your normal activities as told by your health care provider. Ask your health care provider what activities are safe for you. °· Do not  lift anything that is heavier than 10 lb (4.5 kg), or the limit that you are told, until your health care provider says that it is safe. °General instructions °· Take over-the-counter and prescription medicines only as told by your health care provider. °· Do not take baths, swim, or use a hot tub until your health care provider approves. Ask your health care provider if you may take showers. You may only be allowed to take sponge baths. °· Do not drive for 24 hours if you were given a sedative during your procedure. °· Wear a medical alert bracelet in case of an emergency. This will tell any health care providers that you have a port. °· Keep all follow-up visits as told by your health care provider. This is important. °Contact a health care provider if: °· You cannot flush your port with saline as directed, or you cannot draw blood from the port. °· You have a fever or chills. °· You have redness, swelling, or pain around your port insertion site. °· You have fluid or blood coming from your port insertion site. °· Your port insertion site feels warm to the touch. °· You have pus or a bad smell coming from the port insertion site. °Get help right away if: °· You have chest pain or shortness of breath. °· You have bleeding from your port that you cannot control. °Summary °· Take care of the port as told by your health   care provider. Keep the manufacturer's information card with you at all times. °· Change your dressing as told by your health care provider. °· Contact a health care provider if you have a fever or chills or if you have redness, swelling, or pain around your port insertion site. °· Keep all follow-up visits as told by your health care provider. °This information is not intended to replace advice given to you by your health care provider. Make sure you discuss any questions you have with your health care provider. °Document Revised: 08/06/2017 Document Reviewed: 08/06/2017 °Elsevier Patient Education ©  2020 Elsevier Inc. ° °

## 2019-05-12 NOTE — Progress Notes (Unsigned)
Pharmacist Chemotherapy Monitoring - Follow Up Assessment    I verify that I have reviewed each item in the below checklist:  . Regimen for the patient is scheduled for the appropriate day and plan matches scheduled date. Marland Kitchen Appropriate non-routine labs are ordered dependent on drug ordered. . If applicable, additional medications reviewed and ordered per protocol based on lifetime cumulative doses and/or treatment regimen.   Plan for follow-up and/or issues identified: No . I-vent associated with next due treatment: No . MD and/or nursing notified: No  Burnham Trost, [name] 05/12/2019 2:18 PM

## 2019-05-13 ENCOUNTER — Ambulatory Visit: Payer: PPO

## 2019-05-13 ENCOUNTER — Other Ambulatory Visit: Payer: PPO

## 2019-05-15 ENCOUNTER — Other Ambulatory Visit: Payer: Self-pay

## 2019-05-15 ENCOUNTER — Inpatient Hospital Stay (HOSPITAL_BASED_OUTPATIENT_CLINIC_OR_DEPARTMENT_OTHER): Payer: PPO | Admitting: Hematology & Oncology

## 2019-05-15 ENCOUNTER — Telehealth: Payer: Self-pay | Admitting: Hematology & Oncology

## 2019-05-15 ENCOUNTER — Telehealth: Payer: Self-pay | Admitting: Pharmacy Technician

## 2019-05-15 ENCOUNTER — Encounter: Payer: Self-pay | Admitting: Hematology & Oncology

## 2019-05-15 VITALS — BP 152/77 | HR 70 | Temp 97.8°F | Resp 20 | Ht 65.0 in | Wt 210.0 lb

## 2019-05-15 DIAGNOSIS — C9000 Multiple myeloma not having achieved remission: Secondary | ICD-10-CM | POA: Diagnosis not present

## 2019-05-15 DIAGNOSIS — Z5112 Encounter for antineoplastic immunotherapy: Secondary | ICD-10-CM | POA: Diagnosis not present

## 2019-05-15 MED ORDER — SELINEXOR (100 MG ONCE WEEKLY) 20 MG PO TBPK: 100.0000 mg | ORAL_TABLET | ORAL | 3 refills | Status: DC

## 2019-05-15 NOTE — Telephone Encounter (Signed)
Appointments scheduled calendar printed per 4/23 los 

## 2019-05-15 NOTE — Progress Notes (Signed)
Hematology and Oncology Follow Up Visit  Gene Hill [id number] 16-Dec-1940 79 y.o. 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  Unfortunately, our protocol with the daratumumab and carfilzomib just is not working.  When he was here last week, his kappa light chain was up to 85 mg/dL.  Prior to this, it was 31 mg/dL.  [name] was 0.7 g/dL.  The prior level was 0.3 g/dL.  Again have to make a change in his protocol.  I think that were gone have to try something novel.  I think that because the results of the [facility] trial using Selinexor with the Velcade is very reasonable.  I talked to he and his wife about this.  Of note, he is now out from his stem cell transplant probably by 8 years or so.  He has done incredibly well.  I realize that our options are becoming somewhat limited.  However, he still has a good performance status.  He is still active.  He still likes to get into his garden.  He has had no fever.  He has had no nausea or vomiting.  He has had no cough.  He does feel a little bit tired.  Medications:  Allergies as of 05/15/2019   No Known Allergies     Medication List       Accurate as of May 15, 2019  9:03 AM. If you have any questions, ask your nurse or doctor.        acetaminophen 500 MG tablet Commonly known as: TYLENOL Take 1,000 mg by mouth 3 (three) times daily as needed.   aspirin 325 MG tablet Take 325 mg by mouth daily.   atorvastatin 10 MG tablet Commonly known as: LIPITOR Take 1  tablet (10 mg total) by mouth at bedtime.   b complex vitamins tablet Take 1 tablet by mouth daily.   calcium carbonate 1500 (600 Ca) MG Tabs tablet Commonly known as: OSCAL Take 600 mg of elemental calcium by mouth daily with breakfast.   dexamethasone 2 MG tablet Commonly known as: DECADRON TAKE 2 TABLETS BY MOUTH DAY AFTER CHEMO THEN 1 TABLET EVERY DAY FOR 4 DAYS What changed:   how much to take  how to take this  when to take this  additional instructions   Fish Oil 1200 MG Caps Take 1,200 mg by mouth daily.   fluticasone 50 MCG/ACT nasal spray Commonly known as: FLONASE Place 1 spray into both nostrils at bedtime.   methocarbamol 500 MG tablet Commonly known as: ROBAXIN Take 1 tablet (500 mg total) by mouth 3 (three) times daily as needed for muscle spasms.   metoprolol tartrate 50 MG tablet Commonly known as: LOPRESSOR Take 1 tablet (50 mg total) by mouth 2 (two) times daily.   multivitamin tablet Take 1 tablet by mouth daily.   oxybutynin 5 MG 24 hr tablet Commonly known as: DITROPAN-XL Take 5 mg by  mouth daily.   pantoprazole 40 MG tablet Commonly known as: PROTONIX Take 1 tablet (40 mg total) by mouth at bedtime.   PROBIOTIC PO Take 1 tablet by mouth daily.   tamsulosin 0.4 MG Caps capsule Commonly known as: FLOMAX Take 0.4 mg by mouth daily.   traMADol 50 MG tablet Commonly known as: ULTRAM Take 1 tablet (50 mg total) by mouth every 6 (six) hours as needed.   VITAMIN B-12 PO Take 5,000 mcg by mouth daily.       Allergies: No Known Allergies  Past Medical History, Surgical history, Social history, and Family History were reviewed and updated.  Review of Systems: Review of Systems  Constitutional: Negative.   HENT: Negative.   Eyes: Negative.   Respiratory: Negative.   Cardiovascular: Negative.   Gastrointestinal: Negative.   Genitourinary: Negative.   Musculoskeletal: Positive for back pain and joint pain.  Skin: Negative.     Neurological: Negative.   Endo/Heme/Allergies: Negative.   Psychiatric/Behavioral: Negative.       Physical Exam:  vitals were not taken for this visit.   Wt Readings from Last 3 Encounters:  05/06/19 203 lb (92.1 kg)  05/06/19 203 lb 12 oz (92.4 kg)  04/22/19 208 lb (94.3 kg)  Is  Physical Exam Vitals reviewed.  HENT:     Head: Normocephalic and atraumatic.  Eyes:     Pupils: Pupils are equal, round, and reactive to light.  Cardiovascular:     Rate and Rhythm: Normal rate and regular rhythm.     Heart sounds: Normal heart sounds.  Pulmonary:     Effort: Pulmonary effort is normal.     Breath sounds: Normal breath sounds.  Abdominal:     General: Bowel sounds are normal.     Palpations: Abdomen is soft.  Musculoskeletal:        General: No tenderness or deformity. Normal range of motion.     Cervical back: Normal range of motion.     Comments: His extremities shows the surgical scar with a dressing on the right knee.  There is no swelling.  There is no obvious erythema.  Lymphadenopathy:     Cervical: No cervical adenopathy.  Skin:    General: Skin is warm and dry.     Findings: No erythema or rash.  Neurological:     Mental Status: He is alert and oriented to person, place, and time.  Psychiatric:        Behavior: Behavior normal.        Thought Content: Thought content normal.        Judgment: Judgment normal.      Lab Results  Component Value Date   WBC 3.5 (L) 05/12/2019   HGB 9.9 (L) 05/12/2019   HCT 30.5 (L) 05/12/2019   MCV 101.7 (H) 05/12/2019   PLT 80 (L) 05/12/2019   Lab Results  Component Value Date   FERRITIN 128 05/06/2019   IRON 99 05/06/2019   TIBC 308 05/06/2019   UIBC 208 05/06/2019   IRONPCTSAT 32 05/06/2019   Lab Results  Component Value Date   RETICCTPCT 2.1 02/25/2019   RBC 3.00 (L) 05/12/2019   Lab Results  Component Value Date   KPAFRELGTCHN 846.9 (H) 05/06/2019   LAMBDASER 2.5 (L) 05/06/2019   KAPLAMBRATIO 338.76 (H)  05/06/2019   Lab Results  Component Value Date   IGGSERUM 114 (L) 05/06/2019   IGA 1,373 (H) 05/06/2019   IGMSERUM <5 (L) 05/06/2019   Lab Results  Component Value  Date   TOTALPROTELP 6.5 05/06/2019   ALBUMINELP 3.5 05/06/2019   A1GS 0.2 05/06/2019   A2GS 0.8 05/06/2019   BETS 1.7 (H) 05/06/2019   BETA2SER 2.0 (H) 12/08/2014   GAMS 0.3 (L) 05/06/2019   MSPIKE 0.7 (H) 05/06/2019   SPEI Comment 09/02/2017     Chemistry      Component Value Date/Time   NA 140 05/12/2019 0958   NA 141 01/21/2017 0741   NA 139 06/14/2016 1246   K 3.8 05/12/2019 0958   K 4.0 01/21/2017 0741   K 3.8 06/14/2016 1246   CL 102 05/12/2019 0958   CL 103 01/21/2017 0741   CO2 27 05/12/2019 0958   CO2 30 01/21/2017 0741   CO2 24 06/14/2016 1246   BUN 26 (H) 05/12/2019 0958   BUN 20 01/21/2017 0741   BUN 15.0 06/14/2016 1246   CREATININE 1.07 05/12/2019 0958   CREATININE 1.0 01/21/2017 0741   CREATININE 0.9 06/14/2016 1246      Component Value Date/Time   CALCIUM 9.8 05/12/2019 0958   CALCIUM 9.0 01/21/2017 0741   CALCIUM 9.6 06/14/2016 1246   ALKPHOS 31 (L) 05/12/2019 0958   ALKPHOS 37 01/21/2017 0741   ALKPHOS 54 06/14/2016 1246   AST 14 (L) 05/12/2019 0958   AST 25 06/14/2016 1246   ALT 16 05/12/2019 0958   ALT 29 01/21/2017 0741   ALT 21 06/14/2016 1246   BILITOT 0.4 05/12/2019 0958   BILITOT 0.37 06/14/2016 1246      Impression and Plan: Mr. Rask is a very pleasant 79 yo caucasian gentleman with history of IgA kappa myeloma. He had high risk cytogenetics with a 17p- abnormality.   We will have to make a change in his protocol.  Again, I think that the Selinexor along with Velcade would be a good idea for him.  I spent about an hour with he and his wife.  I explained the new protocol.  He will take Selinexor at 100 mg p.o. q. weekly.  The Velcade will also be done subcutaneously q. weekly.  I think we can try to hold off on Decadron.  He really has a hard time with  Decadron.  We will try to get things started on May 3.  I will plan to see him back in 1 month.  The light chain will clearly tell us if treatment is working.    [name], MD 4/23/20219:03 AM

## 2019-05-15 NOTE — Telephone Encounter (Signed)
Oral Oncology Patient Advocate Encounter   Received notification from [employer] that prior authorization for Xpovio is required.   CoverMyMeds Key: [id number] Status is Denied - Timed out because responses to questions and clinical documentation was not submitted within their window.   Oral Oncology Clinic will continue to follow.  [place] Patient [place] Phone [phone] Fax [phone] 05/18/2019 1:56 PM

## 2019-05-18 ENCOUNTER — Other Ambulatory Visit: Payer: Self-pay | Admitting: *Deleted

## 2019-05-18 ENCOUNTER — Telehealth: Payer: Self-pay | Admitting: Pharmacist

## 2019-05-18 ENCOUNTER — Telehealth: Payer: Self-pay | Admitting: *Deleted

## 2019-05-18 DIAGNOSIS — C9000 Multiple myeloma not having achieved remission: Secondary | ICD-10-CM

## 2019-05-18 MED ORDER — FAMCICLOVIR 250 MG PO TABS
250.0000 mg | ORAL_TABLET | Freq: Every day | ORAL | 2 refills | Status: DC
Start: 2019-05-18 — End: 2019-07-07

## 2019-05-18 NOTE — Progress Notes (Signed)
Pharmacist Chemotherapy Monitoring - Initial Assessment    Anticipated start date: 05/25/19  Regimen:  . Are orders appropriate based on the patient's diagnosis, regimen, and cycle? Yes . Does the plan date match the patient's scheduled date? Yes . Is the sequencing of drugs appropriate? Yes . Are the premedications appropriate for the patient's regimen? Yes . Prior Authorization for treatment is: Approved o If applicable, is the correct biosimilar selected based on the patient's insurance? not applicable  Organ Function and Labs: Marland Kitchen Are dose adjustments needed based on the patient's renal function, hepatic function, or hematologic function? No . Are appropriate labs ordered prior to the start of patient's treatment? Yes . Other organ system assessment, if indicated: N/A . The following baseline labs, if indicated, have been ordered: N/A  Dose Assessment: . Are the drug doses appropriate? Yes . Are the following correct: o Drug concentrations Yes o IV fluid compatible with drug Yes o Administration routes Yes o Timing of therapy Yes . If applicable, does the patient have documented access for treatment and/or plans for port-a-cath placement? not applicable . If applicable, have lifetime cumulative doses been properly documented and assessed? not applicable Lifetime Dose Tracking  No doses have been documented on this patient for the following tracked chemicals: Doxorubicin, Epirubicin, Idarubicin, Daunorubicin, Mitoxantrone, Bleomycin, Oxaliplatin, Carboplatin, Liposomal Doxorubicin  o   Toxicity Monitoring/Prevention: . The patient has the following take home antiemetics prescribed: Dexamethasone , Coompazine . The patient has the following take home medications prescribed: VZV prophylaxis . Medication allergies and previous infusion related reactions, if applicable, have been reviewed and addressed. Yes . The patient's current medication list has been assessed for drug-drug  interactions with their chemotherapy regimen. no significant drug-drug interactions were identified on review.  Order Review: . Are the treatment plan orders signed? No . Is the patient scheduled to see a provider prior to their treatment? No  I verify that I have reviewed each item in the above checklist and answered each question accordingly.  Kolby Myung, [name] 05/18/2019 2:13 PM

## 2019-05-18 NOTE — Telephone Encounter (Signed)
Oral Oncology Pharmacist Encounter  Received new prescription for Xpovio (selinexor) for the treatment of relapsed multiple myeloma, planned duration until disease progression or unacceptable drug toxicity.  Prescription dose and frequency assessed.   Current medication list in Epic reviewed, no DDIs with selinexor identified.  Prescription has been e-scribed to [employer] for benefits analysis and approval.  Oral Oncology Clinic will continue to follow for insurance authorization, copayment issues, initial counseling and start date.  Darl Pikes, PharmD, BCPS, BCOP, CPP Hematology/Oncology Clinical Pharmacist [facility] [phone]  05/18/2019 4:32 PM

## 2019-05-18 NOTE — Telephone Encounter (Signed)
Message received from patient's wife concerned that Selinexor has been denied per their insurance. Call placed back to patient to inform him that Delice Bison, pharmacy tech is aware and is working on Ware[employer] for [employer].  Pt appreciative of call back and has no further questions at this time.

## 2019-05-19 ENCOUNTER — Inpatient Hospital Stay: Payer: PPO

## 2019-05-20 ENCOUNTER — Ambulatory Visit: Payer: PPO

## 2019-05-20 ENCOUNTER — Other Ambulatory Visit: Payer: PPO

## 2019-05-20 MED ORDER — SELINEXOR (100 MG ONCE WEEKLY) 20 MG PO TBPK: 100.0000 mg | ORAL_TABLET | ORAL | 3 refills | Status: DC

## 2019-05-21 NOTE — Telephone Encounter (Signed)
Oral Oncology Patient Advocate Encounter  An appeal was submitted on 05/20/19 to [employer] to overturn the denial of Xpovio.   Appeal packet has been faxed to [phone].    The insurance company states that a 32 hour turn around time is to be expected for urgent redeterminations of denial.   This encounter will continue to be updated until final appeal determination.    [place] Patient [place] Phone [phone] Fax [phone] 05/21/2019 9:41 AM

## 2019-05-21 NOTE — Telephone Encounter (Signed)
Oral Oncology Patient Advocate Encounter  Received notification from [employer] that the prior authorization denial for Gene Hill has been overturned and is now approved.  Effective dates: 05/20/2019 through 05/19/2020  Patients co-pay is $3126.02  Oral Oncology Clinic will continue to follow.   [place] Patient [place] Phone [phone] Fax [phone] 05/21/2019 9:47 AM

## 2019-05-22 ENCOUNTER — Other Ambulatory Visit: Payer: Self-pay | Admitting: *Deleted

## 2019-05-22 DIAGNOSIS — C9000 Multiple myeloma not having achieved remission: Secondary | ICD-10-CM

## 2019-05-25 ENCOUNTER — Inpatient Hospital Stay: Payer: PPO | Attending: Hematology & Oncology

## 2019-05-25 ENCOUNTER — Other Ambulatory Visit: Payer: Self-pay

## 2019-05-25 ENCOUNTER — Inpatient Hospital Stay: Payer: PPO

## 2019-05-25 VITALS — BP 135/65 | HR 72 | Temp 98.0°F | Resp 20

## 2019-05-25 DIAGNOSIS — Z5112 Encounter for antineoplastic immunotherapy: Secondary | ICD-10-CM | POA: Insufficient documentation

## 2019-05-25 DIAGNOSIS — Z79899 Other long term (current) drug therapy: Secondary | ICD-10-CM | POA: Diagnosis not present

## 2019-05-25 DIAGNOSIS — Z95828 Presence of other vascular implants and grafts: Secondary | ICD-10-CM

## 2019-05-25 DIAGNOSIS — D649 Anemia, unspecified: Secondary | ICD-10-CM | POA: Insufficient documentation

## 2019-05-25 DIAGNOSIS — N200 Calculus of kidney: Secondary | ICD-10-CM | POA: Diagnosis not present

## 2019-05-25 DIAGNOSIS — C9000 Multiple myeloma not having achieved remission: Secondary | ICD-10-CM

## 2019-05-25 DIAGNOSIS — C9002 Multiple myeloma in relapse: Secondary | ICD-10-CM | POA: Insufficient documentation

## 2019-05-25 LAB — CBC WITH DIFFERENTIAL (CANCER CENTER ONLY)
Abs Immature Granulocytes: 0.01 10*3/uL (ref 0.00–0.07)
Basophils Absolute: 0 10*3/uL (ref 0.0–0.1)
Basophils Relative: 0 %
Eosinophils Absolute: 0 10*3/uL (ref 0.0–0.5)
Eosinophils Relative: 1 %
HCT: 28.8 % — ABNORMAL LOW (ref 39.0–52.0)
Hemoglobin: 9.3 g/dL — ABNORMAL LOW (ref 13.0–17.0)
Immature Granulocytes: 0 %
Lymphocytes Relative: 37 %
Lymphs Abs: 1.2 10*3/uL (ref 0.7–4.0)
MCH: 33.3 pg (ref 26.0–34.0)
MCHC: 32.3 g/dL (ref 30.0–36.0)
MCV: 103.2 fL — ABNORMAL HIGH (ref 80.0–100.0)
Monocytes Absolute: 0.3 10*3/uL (ref 0.1–1.0)
Monocytes Relative: 10 %
Neutro Abs: 1.6 10*3/uL — ABNORMAL LOW (ref 1.7–7.7)
Neutrophils Relative %: 52 %
Platelet Count: 98 10*3/uL — ABNORMAL LOW (ref 150–400)
RBC: 2.79 MIL/uL — ABNORMAL LOW (ref 4.22–5.81)
RDW: 15.3 % (ref 11.5–15.5)
WBC Count: 3.2 10*3/uL — ABNORMAL LOW (ref 4.0–10.5)
nRBC: 0 % (ref 0.0–0.2)

## 2019-05-25 LAB — CMP (CANCER CENTER ONLY)
ALT: 15 U/L (ref 0–44)
AST: 16 U/L (ref 15–41)
Albumin: 4.1 g/dL (ref 3.5–5.0)
Alkaline Phosphatase: 41 U/L (ref 38–126)
Anion gap: 9 (ref 5–15)
BUN: 14 mg/dL (ref 8–23)
CO2: 27 mmol/L (ref 22–32)
Calcium: 9.7 mg/dL (ref 8.9–10.3)
Chloride: 103 mmol/L (ref 98–111)
Creatinine: 1.03 mg/dL (ref 0.61–1.24)
GFR, Est AFR Am: >60 mL/min (ref >60)
GFR, Estimated: >60 mL/min (ref >60)
Glucose, Bld: 130 mg/dL — ABNORMAL HIGH (ref 70–99)
Potassium: 3.9 mmol/L (ref 3.5–5.1)
Sodium: 139 mmol/L (ref 135–145)
Total Bilirubin: 0.4 mg/dL (ref 0.3–1.2)
Total Protein: 6.9 g/dL (ref 6.5–8.1)

## 2019-05-25 MED ORDER — PROCHLORPERAZINE MALEATE 10 MG PO TABS
10.0000 mg | ORAL_TABLET | Freq: Once | ORAL | Status: AC
  Administered 2019-05-25: 10 mg via ORAL

## 2019-05-25 MED ORDER — SODIUM CHLORIDE 0.9% FLUSH
10.0000 mL | INTRAVENOUS | Status: DC | PRN
  Filled 2019-05-25: qty 10

## 2019-05-25 MED ORDER — HEPARIN SOD (PORK) LOCK FLUSH 100 UNIT/ML IV SOLN
500.0000 [IU] | Freq: Once | INTRAVENOUS | Status: DC
  Administered 2019-05-25: 500 [IU] via INTRAVENOUS
  Filled 2019-05-25: qty 5

## 2019-05-25 MED ORDER — PROCHLORPERAZINE MALEATE 10 MG PO TABS
ORAL_TABLET | ORAL | Status: AC
  Filled 2019-05-25: qty 1

## 2019-05-25 MED ORDER — BORTEZOMIB CHEMO SQ INJECTION 3.5 MG (2.5MG/ML)
1.3000 mg/m2 | Freq: Once | INTRAMUSCULAR | Status: AC
  Administered 2019-05-25: 2.75 mg via SUBCUTANEOUS
  Filled 2019-05-25: qty 1.1

## 2019-05-25 NOTE — Telephone Encounter (Signed)
Oral Chemotherapy Pharmacist Encounter  Plans to take first dose of Xpovio tonight 05/25/19.  Patient Education I spoke with patient for overview of new oral chemotherapy medication: Xpovio (selinexor) for the treatment of relapsed multiple myeloma, planned duration until disease progression or unacceptable drug toxicity.   Pt is doing well. Counseled patient on administration, dosing, side effects, monitoring, drug-food interactions, safe handling, storage, and disposal. Patient will take 100 mg by mouth once a week.  Side effects include but not limited to: N/V, fatigue, diarrhea.    Reviewed with patient importance of keeping a medication schedule and plan for any missed doses.  Ms. Cambre voiced understanding and appreciation. All questions answered. Medication handout provided to the patient while in office.  Provided patient with [facility] phone number. Patient knows to call the office with questions or concerns. Oral Chemotherapy Navigation Clinic will continue to follow.  Darl Pikes, PharmD, BCPS, BCOP, CPP Hematology/Oncology Clinical Pharmacist [facility] [phone]  05/25/2019 4:37 PM

## 2019-05-25 NOTE — Telephone Encounter (Signed)
Patient was approved for grant funding from the [employer] per [place] at [employer].  Patient will receive medication on 05/25/2019.

## 2019-05-25 NOTE — Patient Instructions (Signed)
Bortezomib injection What is this medicine? BORTEZOMIB (bor TEZ oh mib) is a medicine that targets proteins in cancer cells and stops the cancer cells from growing. It is used to treat multiple myeloma and mantle-cell lymphoma. This medicine may be used for other purposes; ask your health care provider or pharmacist if you have questions. COMMON BRAND NAME(S): Velcade What should I tell my health care provider before I take this medicine? They need to know if you have any of these conditions:  diabetes  heart disease  irregular heartbeat  liver disease  on hemodialysis  low blood counts, like low white blood cells, platelets, or hemoglobin  peripheral neuropathy  taking medicine for blood pressure  an unusual or allergic reaction to bortezomib, mannitol, boron, other medicines, foods, dyes, or preservatives  pregnant or trying to get pregnant  breast-feeding How should I use this medicine? This medicine is for injection into a vein or for injection under the skin. It is given by a health care professional in a hospital or clinic setting. Talk to your pediatrician regarding the use of this medicine in children. Special care may be needed. Overdosage: If you think you have taken too much of this medicine contact a poison control center or emergency room at once. NOTE: This medicine is only for you. Do not share this medicine with others. What if I miss a dose? It is important not to miss your dose. Call your doctor or health care professional if you are unable to keep an appointment. What may interact with this medicine? This medicine may interact with the following medications:  ketoconazole  rifampin  ritonavir  St. John's Wort This list may not describe all possible interactions. Give your health care provider a list of all the medicines, herbs, non-prescription drugs, or dietary supplements you use. Also tell them if you smoke, drink alcohol, or use illegal drugs. Some  items may interact with your medicine. What should I watch for while using this medicine? You may get drowsy or dizzy. Do not drive, use machinery, or do anything that needs mental alertness until you know how this medicine affects you. Do not stand or sit up quickly, especially if you are an older patient. This reduces the risk of dizzy or fainting spells. In some cases, you may be given additional medicines to help with side effects. Follow all directions for their use. Call your doctor or health care professional for advice if you get a fever, chills or sore throat, or other symptoms of a cold or flu. Do not treat yourself. This drug decreases your body's ability to fight infections. Try to avoid being around people who are sick. This medicine may increase your risk to bruise or bleed. Call your doctor or health care professional if you notice any unusual bleeding. You may need blood work done while you are taking this medicine. In some patients, this medicine may cause a serious brain infection that may cause death. If you have any problems seeing, thinking, speaking, walking, or standing, tell your doctor right away. If you cannot reach your doctor, urgently seek other source of medical care. Check with your doctor or health care professional if you get an attack of severe diarrhea, nausea and vomiting, or if you sweat a lot. The loss of too much body fluid can make it dangerous for you to take this medicine. Do not become pregnant while taking this medicine or for at least 7 months after stopping it. Women should inform their doctor   if they wish to become pregnant or think they might be pregnant. Men should not father a child while taking this medicine and for at least 4 months after stopping it. There is a potential for serious side effects to an unborn child. Talk to your health care professional or pharmacist for more information. Do not breast-feed an infant while taking this medicine or for 2  months after stopping it. This medicine may interfere with the ability to have a child. You should talk with your doctor or health care professional if you are concerned about your fertility. What side effects may I notice from receiving this medicine? Side effects that you should report to your doctor or health care professional as soon as possible:  allergic reactions like skin rash, itching or hives, swelling of the face, lips, or tongue  breathing problems  changes in hearing  changes in vision  fast, irregular heartbeat  feeling faint or lightheaded, falls  pain, tingling, numbness in the hands or feet  right upper belly pain  seizures  swelling of the ankles, feet, hands  unusual bleeding or bruising  unusually weak or tired  vomiting  yellowing of the eyes or skin Side effects that usually do not require medical attention (report to your doctor or health care professional if they continue or are bothersome):  changes in emotions or moods  constipation  diarrhea  loss of appetite  headache  irritation at site where injected  nausea This list may not describe all possible side effects. Call your doctor for medical advice about side effects. You may report side effects to FDA at 1-800-FDA-1088. Where should I keep my medicine? This drug is given in a hospital or clinic and will not be stored at home. NOTE: This sheet is a summary. It may not cover all possible information. If you have questions about this medicine, talk to your doctor, pharmacist, or health care provider.  2020 Elsevier/Gold Standard (2017-05-20 16:29:31)  

## 2019-05-25 NOTE — Patient Instructions (Signed)
Implanted Port Insertion, Care After °This sheet gives you information about how to care for yourself after your procedure. Your health care provider may also give you more specific instructions. If you have problems or questions, contact your health care provider. °What can I expect after the procedure? °After the procedure, it is common to have: °· Discomfort at the port insertion site. °· Bruising on the skin over the port. This should improve over 3-4 days. °Follow these instructions at home: °Port care °· After your port is placed, you will get a manufacturer's information card. The card has information about your port. Keep this card with you at all times. °· Take care of the port as told by your health care provider. Ask your health care provider if you or a family member can get training for taking care of the port at home. A home health care nurse may also take care of the port. °· Make sure to remember what type of port you have. °Incision care ° °  ° °· Follow instructions from your health care provider about how to take care of your port insertion site. Make sure you: °? Wash your hands with soap and water before and after you change your bandage (dressing). If soap and water are not available, use hand sanitizer. °? Change your dressing as told by your health care provider. °? Leave stitches (sutures), skin glue, or adhesive strips in place. These skin closures may need to stay in place for 2 weeks or longer. If adhesive strip edges start to loosen and curl up, you may trim the loose edges. Do not remove adhesive strips completely unless your health care provider tells you to do that. °· Check your port insertion site every day for signs of infection. Check for: °? Redness, swelling, or pain. °? Fluid or blood. °? Warmth. °? Pus or a bad smell. °Activity °· Return to your normal activities as told by your health care provider. Ask your health care provider what activities are safe for you. °· Do not  lift anything that is heavier than 10 lb (4.5 kg), or the limit that you are told, until your health care provider says that it is safe. °General instructions °· Take over-the-counter and prescription medicines only as told by your health care provider. °· Do not take baths, swim, or use a hot tub until your health care provider approves. Ask your health care provider if you may take showers. You may only be allowed to take sponge baths. °· Do not drive for 24 hours if you were given a sedative during your procedure. °· Wear a medical alert bracelet in case of an emergency. This will tell any health care providers that you have a port. °· Keep all follow-up visits as told by your health care provider. This is important. °Contact a health care provider if: °· You cannot flush your port with saline as directed, or you cannot draw blood from the port. °· You have a fever or chills. °· You have redness, swelling, or pain around your port insertion site. °· You have fluid or blood coming from your port insertion site. °· Your port insertion site feels warm to the touch. °· You have pus or a bad smell coming from the port insertion site. °Get help right away if: °· You have chest pain or shortness of breath. °· You have bleeding from your port that you cannot control. °Summary °· Take care of the port as told by your health   care provider. Keep the manufacturer's information card with you at all times. °· Change your dressing as told by your health care provider. °· Contact a health care provider if you have a fever or chills or if you have redness, swelling, or pain around your port insertion site. °· Keep all follow-up visits as told by your health care provider. °This information is not intended to replace advice given to you by your health care provider. Make sure you discuss any questions you have with your health care provider. °Document Revised: 08/06/2017 Document Reviewed: 08/06/2017 °Elsevier Patient Education ©  2020 Elsevier Inc. ° °

## 2019-05-25 NOTE — Progress Notes (Signed)
Pharmacist Chemotherapy Monitoring - Follow Up Assessment    I verify that I have reviewed each item in the below checklist:  . Regimen for the patient is scheduled for the appropriate day and plan matches scheduled date. Marland Kitchen Appropriate non-routine labs are ordered dependent on drug ordered. . If applicable, additional medications reviewed and ordered per protocol based on lifetime cumulative doses and/or treatment regimen.   Plan for follow-up and/or issues identified: No . I-vent associated with next due treatment: No . MD and/or nursing notified: No  Gene Hill, Gene Hill 05/25/2019 8:42 AM

## 2019-05-25 NOTE — Progress Notes (Signed)
Ok to treat with platelets of 98 per DR Ennever. dph

## 2019-05-27 ENCOUNTER — Other Ambulatory Visit: Payer: Self-pay | Admitting: *Deleted

## 2019-05-27 MED ORDER — ONDANSETRON HCL 8 MG PO TABS
8.0000 mg | ORAL_TABLET | Freq: Three times a day (TID) | ORAL | 1 refills | Status: DC | PRN
Start: 2019-05-27 — End: 2019-08-24

## 2019-05-29 ENCOUNTER — Other Ambulatory Visit: Payer: Self-pay | Admitting: *Deleted

## 2019-05-29 DIAGNOSIS — C9000 Multiple myeloma not having achieved remission: Secondary | ICD-10-CM

## 2019-06-01 ENCOUNTER — Encounter: Payer: Self-pay | Admitting: Hematology & Oncology

## 2019-06-01 ENCOUNTER — Other Ambulatory Visit: Payer: Self-pay

## 2019-06-01 ENCOUNTER — Inpatient Hospital Stay (HOSPITAL_BASED_OUTPATIENT_CLINIC_OR_DEPARTMENT_OTHER): Payer: PPO | Admitting: Hematology & Oncology

## 2019-06-01 ENCOUNTER — Inpatient Hospital Stay: Payer: PPO

## 2019-06-01 ENCOUNTER — Other Ambulatory Visit: Payer: Self-pay | Admitting: *Deleted

## 2019-06-01 ENCOUNTER — Other Ambulatory Visit: Payer: Self-pay | Admitting: Nurse Practitioner

## 2019-06-01 DIAGNOSIS — C9000 Multiple myeloma not having achieved remission: Secondary | ICD-10-CM | POA: Diagnosis not present

## 2019-06-01 DIAGNOSIS — N281 Cyst of kidney, acquired: Secondary | ICD-10-CM

## 2019-06-01 DIAGNOSIS — Z5112 Encounter for antineoplastic immunotherapy: Secondary | ICD-10-CM | POA: Diagnosis not present

## 2019-06-01 LAB — CBC WITH DIFFERENTIAL (CANCER CENTER ONLY)
Abs Immature Granulocytes: 0.01 10*3/uL (ref 0.00–0.07)
Basophils Absolute: 0 10*3/uL (ref 0.0–0.1)
Basophils Relative: 0 %
Eosinophils Absolute: 0 10*3/uL (ref 0.0–0.5)
Eosinophils Relative: 0 %
HCT: 26.7 % — ABNORMAL LOW (ref 39.0–52.0)
Hemoglobin: 8.8 g/dL — ABNORMAL LOW (ref 13.0–17.0)
Immature Granulocytes: 0 %
Lymphocytes Relative: 50 %
Lymphs Abs: 1.2 10*3/uL (ref 0.7–4.0)
MCH: 33.3 pg (ref 26.0–34.0)
MCHC: 33 g/dL (ref 30.0–36.0)
MCV: 101.1 fL — ABNORMAL HIGH (ref 80.0–100.0)
Monocytes Absolute: 0.3 10*3/uL (ref 0.1–1.0)
Monocytes Relative: 14 %
Neutro Abs: 0.9 10*3/uL — ABNORMAL LOW (ref 1.7–7.7)
Neutrophils Relative %: 36 %
Platelet Count: 77 10*3/uL — ABNORMAL LOW (ref 150–400)
RBC: 2.64 MIL/uL — ABNORMAL LOW (ref 4.22–5.81)
RDW: 15.3 % (ref 11.5–15.5)
WBC Count: 2.4 10*3/uL — ABNORMAL LOW (ref 4.0–10.5)
nRBC: 0 % (ref 0.0–0.2)

## 2019-06-01 LAB — CMP (CANCER CENTER ONLY)
ALT: 13 U/L (ref 0–44)
AST: 14 U/L — ABNORMAL LOW (ref 15–41)
Albumin: 4.1 g/dL (ref 3.5–5.0)
Alkaline Phosphatase: 33 U/L — ABNORMAL LOW (ref 38–126)
Anion gap: 10 (ref 5–15)
BUN: 18 mg/dL (ref 8–23)
CO2: 25 mmol/L (ref 22–32)
Calcium: 9.5 mg/dL (ref 8.9–10.3)
Chloride: 102 mmol/L (ref 98–111)
Creatinine: 0.98 mg/dL (ref 0.61–1.24)
GFR, Est AFR Am: >60 mL/min (ref >60)
GFR, Estimated: >60 mL/min (ref >60)
Glucose, Bld: 112 mg/dL — ABNORMAL HIGH (ref 70–99)
Potassium: 4.1 mmol/L (ref 3.5–5.1)
Sodium: 137 mmol/L (ref 135–145)
Total Bilirubin: 0.3 mg/dL (ref 0.3–1.2)
Total Protein: 6.7 g/dL (ref 6.5–8.1)

## 2019-06-01 MED ORDER — BORTEZOMIB CHEMO SQ INJECTION 3.5 MG (2.5MG/ML)
1.3000 mg/m2 | Freq: Once | INTRAMUSCULAR | Status: AC
  Administered 2019-06-01: 2.75 mg via SUBCUTANEOUS
  Filled 2019-06-01: qty 1.1

## 2019-06-01 MED ORDER — SODIUM CHLORIDE 0.9% FLUSH
10.0000 mL | Freq: Once | INTRAVENOUS | Status: AC
  Administered 2019-06-01: 10 mL via INTRAVENOUS
  Filled 2019-06-01: qty 10

## 2019-06-01 MED ORDER — HEPARIN SOD (PORK) LOCK FLUSH 100 UNIT/ML IV SOLN
500.0000 [IU] | Freq: Once | INTRAVENOUS | Status: AC
  Administered 2019-06-01: 500 [IU] via INTRAVENOUS
  Filled 2019-06-01: qty 5

## 2019-06-01 MED ORDER — PROCHLORPERAZINE MALEATE 10 MG PO TABS
10.0000 mg | ORAL_TABLET | Freq: Once | ORAL | Status: AC
  Administered 2019-06-01: 10 mg via ORAL

## 2019-06-01 MED ORDER — PROCHLORPERAZINE MALEATE 10 MG PO TABS
ORAL_TABLET | ORAL | Status: AC
  Filled 2019-06-01: qty 1

## 2019-06-01 MED ORDER — DRONABINOL 2.5 MG PO CAPS
2.5000 mg | ORAL_CAPSULE | Freq: Two times a day (BID) | ORAL | 0 refills | Status: DC
Start: 2019-06-01 — End: 2019-07-01

## 2019-06-01 NOTE — Addendum Note (Signed)
Addended by: Burney Gauze R on: 06/01/2019 12:27 PM   Modules accepted: Orders

## 2019-06-01 NOTE — Patient Instructions (Signed)
[facility] Discharge Instructions for Patients Receiving Chemotherapy  Today you received the following chemotherapy agents Velcade  To help prevent nausea and vomiting after your treatment, we encourage you to take your nausea medication as prescribed by MD.   If you develop nausea and vomiting that is not controlled by your nausea medication, call the clinic.   BELOW ARE SYMPTOMS THAT SHOULD BE REPORTED IMMEDIATELY:  *FEVER GREATER THAN 100.5 F  *CHILLS WITH OR WITHOUT FEVER  NAUSEA AND VOMITING THAT IS NOT CONTROLLED WITH YOUR NAUSEA MEDICATION  *UNUSUAL SHORTNESS OF BREATH  *UNUSUAL BRUISING OR BLEEDING  TENDERNESS IN MOUTH AND THROAT WITH OR WITHOUT PRESENCE OF ULCERS  *URINARY PROBLEMS  *BOWEL PROBLEMS  UNUSUAL RASH Items with * indicate a potential emergency and should be followed up as soon as possible.  Feel free to call the clinic should you have any questions or concerns. The clinic phone number is [phone].  Please show the [place] at check-in to the Emergency Department and triage nurse.

## 2019-06-01 NOTE — Progress Notes (Unsigned)
Pharmacist Chemotherapy Monitoring - Follow Up Assessment    I verify that I have reviewed each item in the below checklist:  . Regimen for the patient is scheduled for the appropriate day and plan matches scheduled date. Marland Kitchen Appropriate non-routine labs are ordered dependent on drug ordered. . If applicable, additional medications reviewed and ordered per protocol based on lifetime cumulative doses and/or treatment regimen.   Plan for follow-up and/or issues identified: No . I-vent associated with next due treatment: No . MD and/or nursing notified: No  Madalynne Gutmann, [name] 06/01/2019 9:02 AM

## 2019-06-01 NOTE — Patient Instructions (Signed)

## 2019-06-01 NOTE — Progress Notes (Signed)
Hematology and Oncology Follow Up Visit  Gene Hill [id number] 12-24-1940 79 y.o. 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  Apparently, he and his wife got some paperwork back.  On the paperwork, it mention that this was palliative care.  His wife used to work at hospice thought that he was going to be on hospice soon.  I explained to them that the palliative was referring to the reason for doing the Selinexor.  Since he does not have a curable disease, anything that we do is palliative to try to help prevent symptoms.  He has lost some weight.  Is not eating that well.  He might benefit from some Marinol.  I will try him on some Marinol.  Hopefully, this might get his appetite going a little bit.  He has had a little bit of constipation.  There is no diarrhea.  More last saw him, [name] was 0.7 g/dL.  The IgA level was 1370 mg/dL.  His kappa light chain was 85 mg/dL.  He is having no problems urinating.  His labs look pretty good.  His creatinine 0.98.  His total protein is 6.7.  I have to believe this is a indicator that things are working.  Overall, I would say his performance status is ECOG 2.    Medications:  Allergies as of 06/01/2019   No Known Allergies     Medication List       Accurate as of Jun 01, 2019 11:56 AM. If you have any questions, ask your nurse or doctor.        acetaminophen 500 MG tablet Commonly known as: TYLENOL Take 1,000 mg by mouth 3 (three) times daily as  needed.   aspirin 325 MG tablet Take 325 mg by mouth daily.   atorvastatin 10 MG tablet Commonly known as: LIPITOR Take 1 tablet (10 mg total) by mouth at bedtime.   b complex vitamins tablet Take 1 tablet by mouth daily.   calcium carbonate 1500 (600 Ca) MG Tabs tablet Commonly known as: OSCAL Take 600 mg of elemental calcium by mouth daily with breakfast.   dexamethasone 2 MG tablet Commonly known as: DECADRON TAKE 2 TABLETS BY MOUTH DAY AFTER CHEMO THEN 1 TABLET EVERY DAY FOR 4 DAYS What changed:   how much to take  how to take this  when to take this  additional instructions   famciclovir 250 MG tablet Commonly known as: FAMVIR Take 1 tablet (250 mg total) by mouth daily.   Fish Oil 1200 MG Caps Take 1,200 mg by mouth daily.   fluticasone 50 MCG/ACT nasal spray Commonly known as: FLONASE Place 1 spray into both nostrils at bedtime.   methocarbamol 500 MG tablet Commonly known as: ROBAXIN Take 1 tablet (500 mg total) by mouth 3 (three) times daily as needed for muscle spasms.   metoprolol tartrate 50 MG tablet Commonly known as: LOPRESSOR  Take 1 tablet (50 mg total) by mouth 2 (two) times daily.   multivitamin tablet Take 1 tablet by mouth daily.   ondansetron 8 MG tablet Commonly known as: ZOFRAN Take 1 tablet (8 mg total) by mouth every 8 (eight) hours as needed for nausea or vomiting.   oxybutynin 5 MG 24 hr tablet Commonly known as: DITROPAN-XL Take 5 mg by mouth daily.   pantoprazole 40 MG tablet Commonly known as: PROTONIX Take 1 tablet (40 mg total) by mouth at bedtime.   PROBIOTIC PO Take 1 tablet by mouth daily.   selinexor 20 MG Tbpk Take 100 mg by mouth once a week.   tamsulosin 0.4 MG Caps capsule Commonly known as: FLOMAX Take 0.4 mg by mouth daily.   traMADol 50 MG tablet Commonly known as: ULTRAM Take 1 tablet (50 mg total) by mouth every 6 (six) hours as needed.   VITAMIN B-12 PO Take 5,000 mcg by mouth daily.        Allergies: No Known Allergies  Past Medical History, Surgical history, Social history, and Family History were reviewed and updated.  Review of Systems: Review of Systems  Constitutional: Negative.   HENT: Negative.   Eyes: Negative.   Respiratory: Negative.   Cardiovascular: Negative.   Gastrointestinal: Negative.   Genitourinary: Negative.   Musculoskeletal: Positive for back pain and joint pain.  Skin: Negative.   Neurological: Negative.   Endo/Heme/Allergies: Negative.   Psychiatric/Behavioral: Negative.       Physical Exam:  vitals were not taken for this visit.   Wt Readings from Last 3 Encounters:  05/15/19 210 lb (95.3 kg)  05/06/19 203 lb (92.1 kg)  05/06/19 203 lb 12 oz (92.4 kg)  Is  Physical Exam Vitals reviewed.  HENT:     Head: Normocephalic and atraumatic.  Eyes:     Pupils: Pupils are equal, round, and reactive to light.  Cardiovascular:     Rate and Rhythm: Normal rate and regular rhythm.     Heart sounds: Normal heart sounds.  Pulmonary:     Effort: Pulmonary effort is normal.     Breath sounds: Normal breath sounds.  Abdominal:     General: Bowel sounds are normal.     Palpations: Abdomen is soft.  Musculoskeletal:        General: No tenderness or deformity. Normal range of motion.     Cervical back: Normal range of motion.     Comments: His extremities shows the surgical scar with a dressing on the right knee.  There is no swelling.  There is no obvious erythema.  Lymphadenopathy:     Cervical: No cervical adenopathy.  Skin:    General: Skin is warm and dry.     Findings: No erythema or rash.  Neurological:     Mental Status: He is alert and oriented to person, place, and time.  Psychiatric:        Behavior: Behavior normal.        Thought Content: Thought content normal.        Judgment: Judgment normal.      Lab Results  Component Value Date   WBC 2.4 (L) 06/01/2019   HGB 8.8 (L) 06/01/2019   HCT 26.7 (L) 06/01/2019    MCV 101.1 (H) 06/01/2019   PLT 77 (L) 06/01/2019   Lab Results  Component Value Date   FERRITIN 128 05/06/2019   IRON 99 05/06/2019   TIBC 308 05/06/2019   UIBC 208 05/06/2019   IRONPCTSAT 32  05/06/2019   Lab Results  Component Value Date   RETICCTPCT 2.1 02/25/2019   RBC 2.64 (L) 06/01/2019   Lab Results  Component Value Date   KPAFRELGTCHN 846.9 (H) 05/06/2019   LAMBDASER 2.5 (L) 05/06/2019   KAPLAMBRATIO 338.76 (H) 05/06/2019   Lab Results  Component Value Date   IGGSERUM 114 (L) 05/06/2019   IGA 1,373 (H) 05/06/2019   IGMSERUM <5 (L) 05/06/2019   Lab Results  Component Value Date   TOTALPROTELP 6.5 05/06/2019   ALBUMINELP 3.5 05/06/2019   A1GS 0.2 05/06/2019   A2GS 0.8 05/06/2019   BETS 1.7 (H) 05/06/2019   BETA2SER 2.0 (H) 12/08/2014   GAMS 0.3 (L) 05/06/2019   MSPIKE 0.7 (H) 05/06/2019   SPEI Comment 09/02/2017     Chemistry      Component Value Date/Time   NA 137 06/01/2019 1045   NA 141 01/21/2017 0741   NA 139 06/14/2016 1246   K 4.1 06/01/2019 1045   K 4.0 01/21/2017 0741   K 3.8 06/14/2016 1246   CL 102 06/01/2019 1045   CL 103 01/21/2017 0741   CO2 25 06/01/2019 1045   CO2 30 01/21/2017 0741   CO2 24 06/14/2016 1246   BUN 18 06/01/2019 1045   BUN 20 01/21/2017 0741   BUN 15.0 06/14/2016 1246   CREATININE 0.98 06/01/2019 1045   CREATININE 1.0 01/21/2017 0741   CREATININE 0.9 06/14/2016 1246      Component Value Date/Time   CALCIUM 9.5 06/01/2019 1045   CALCIUM 9.0 01/21/2017 0741   CALCIUM 9.6 06/14/2016 1246   ALKPHOS 33 (L) 06/01/2019 1045   ALKPHOS 37 01/21/2017 0741   ALKPHOS 54 06/14/2016 1246   AST 14 (L) 06/01/2019 1045   AST 25 06/14/2016 1246   ALT 13 06/01/2019 1045   ALT 29 01/21/2017 0741   ALT 21 06/14/2016 1246   BILITOT 0.3 06/01/2019 1045   BILITOT 0.37 06/14/2016 1246      Impression and Plan: Mr. Pfuhl is a very pleasant 79 yo caucasian gentleman with history of IgA kappa myeloma. He had high risk  cytogenetics with a 17p- abnormality.   He is now on Selinexor.  Again, I explained to him the palliative nature of treatment and that he is not on palliative care.  I told he and his wife that if I felt he needed palliative care that I would have spoken to myself that he would not have seen it on some paperwork.  I have been with the [facility] for a long time.  I am we give them the courtesy of telling them myself that we were getting to the point that we were focusing on palliative care and ultimately hospice.  We will keep his appointment to see me as it has been scheduled previously.      [name], MD 5/10/202111:56 AM

## 2019-06-01 NOTE — Progress Notes (Signed)
Reviewed ANC and platelets as well as all labs today with Dr. Marin Olp.  Ok to treat today

## 2019-06-02 ENCOUNTER — Telehealth: Payer: Self-pay | Admitting: Hematology & Oncology

## 2019-06-02 NOTE — Telephone Encounter (Signed)
No los 5/10

## 2019-06-03 ENCOUNTER — Other Ambulatory Visit: Payer: PPO

## 2019-06-03 ENCOUNTER — Ambulatory Visit: Payer: PPO | Admitting: Family

## 2019-06-03 ENCOUNTER — Ambulatory Visit: Payer: PPO

## 2019-06-08 ENCOUNTER — Inpatient Hospital Stay: Payer: PPO

## 2019-06-08 ENCOUNTER — Inpatient Hospital Stay (HOSPITAL_BASED_OUTPATIENT_CLINIC_OR_DEPARTMENT_OTHER): Payer: PPO | Admitting: Family

## 2019-06-08 ENCOUNTER — Encounter: Payer: Self-pay | Admitting: Family

## 2019-06-08 ENCOUNTER — Other Ambulatory Visit: Payer: Self-pay

## 2019-06-08 ENCOUNTER — Other Ambulatory Visit: Payer: Self-pay | Admitting: *Deleted

## 2019-06-08 VITALS — BP 130/64 | HR 72 | Temp 97.3°F | Resp 18 | Ht 65.0 in | Wt 206.0 lb

## 2019-06-08 DIAGNOSIS — D649 Anemia, unspecified: Secondary | ICD-10-CM

## 2019-06-08 DIAGNOSIS — C9000 Multiple myeloma not having achieved remission: Secondary | ICD-10-CM

## 2019-06-08 DIAGNOSIS — Z5112 Encounter for antineoplastic immunotherapy: Secondary | ICD-10-CM | POA: Diagnosis not present

## 2019-06-08 LAB — CBC WITH DIFFERENTIAL (CANCER CENTER ONLY)
Abs Immature Granulocytes: 0.01 10*3/uL (ref 0.00–0.07)
Basophils Absolute: 0 10*3/uL (ref 0.0–0.1)
Basophils Relative: 0 %
Eosinophils Absolute: 0 10*3/uL (ref 0.0–0.5)
Eosinophils Relative: 0 %
HCT: 24.2 % — ABNORMAL LOW (ref 39.0–52.0)
Hemoglobin: 8 g/dL — ABNORMAL LOW (ref 13.0–17.0)
Immature Granulocytes: 0 %
Lymphocytes Relative: 43 %
Lymphs Abs: 1 10*3/uL (ref 0.7–4.0)
MCH: 33.8 pg (ref 26.0–34.0)
MCHC: 33.1 g/dL (ref 30.0–36.0)
MCV: 102.1 fL — ABNORMAL HIGH (ref 80.0–100.0)
Monocytes Absolute: 0.3 10*3/uL (ref 0.1–1.0)
Monocytes Relative: 13 %
Neutro Abs: 1 10*3/uL — ABNORMAL LOW (ref 1.7–7.7)
Neutrophils Relative %: 44 %
Platelet Count: 65 10*3/uL — ABNORMAL LOW (ref 150–400)
RBC: 2.37 MIL/uL — ABNORMAL LOW (ref 4.22–5.81)
RDW: 15.4 % (ref 11.5–15.5)
WBC Count: 2.2 10*3/uL — ABNORMAL LOW (ref 4.0–10.5)
nRBC: 0 % (ref 0.0–0.2)

## 2019-06-08 LAB — CMP (CANCER CENTER ONLY)
ALT: 11 U/L (ref 0–44)
AST: 14 U/L — ABNORMAL LOW (ref 15–41)
Albumin: 4 g/dL (ref 3.5–5.0)
Alkaline Phosphatase: 32 U/L — ABNORMAL LOW (ref 38–126)
Anion gap: 9 (ref 5–15)
BUN: 11 mg/dL (ref 8–23)
CO2: 25 mmol/L (ref 22–32)
Calcium: 9 mg/dL (ref 8.9–10.3)
Chloride: 104 mmol/L (ref 98–111)
Creatinine: 0.92 mg/dL (ref 0.61–1.24)
GFR, Est AFR Am: >60 mL/min (ref >60)
GFR, Estimated: >60 mL/min (ref >60)
Glucose, Bld: 111 mg/dL — ABNORMAL HIGH (ref 70–99)
Potassium: 4 mmol/L (ref 3.5–5.1)
Sodium: 138 mmol/L (ref 135–145)
Total Bilirubin: 0.3 mg/dL (ref 0.3–1.2)
Total Protein: 6.3 g/dL — ABNORMAL LOW (ref 6.5–8.1)

## 2019-06-08 LAB — PREPARE RBC (CROSSMATCH)

## 2019-06-08 NOTE — Progress Notes (Signed)
Hematology and Oncology Follow Up Visit  Gene Hill [id number] 10/19/1940 79 y.o. 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 He is symptomatic with fatigue, weakness and SOB with over exertion.  His Hgb is down to 8.0, MCV 102, WBC count 2.2 and platelets 65.  Last month M-spike was 0.7, IgA level 1,295 mg/dL and kappa light chains 84.69 mg/dL. He has not noted any blood loss. No bruising or petechiae.  He still does not have much of an appetite. He finally received his Marinol Friday and has only just started. He will continue this and see if helps boost his appetite.  He is doing his best to stay well hydrated. His weight is down 4 lbs since his last visit at 206 lbs.  No fever, chills, n/v, cough, rash, dizziness, chest pain, palpitations, abdominal pain or changes in bowel or bladder habits.  He has an appointment for MRI of the abdomen with [facility] on 06/11/2019 to better evaluate his renal cyst.  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  No swelling noted in his extremities at this time.  No falls or syncopal episodes to report.   ECOG Performance Status: 2 - Symptomatic, <50% confined to bed  Medications:  Allergies as of 06/08/2019   No Known Allergies     Medication List       Accurate as of Jun 08, 2019 11:17 AM. If you have any questions, ask your nurse or doctor.        acetaminophen 500 MG tablet Commonly known as: TYLENOL Take 1,000 mg by mouth  3 (three) times daily as needed.   aspirin 325 MG tablet Take 325 mg by mouth daily.   atorvastatin 10 MG tablet Commonly known as: LIPITOR Take 1 tablet (10 mg total) by mouth at bedtime.   b complex vitamins tablet Take 1 tablet by mouth daily.   calcium carbonate 1500 (600 Ca) MG Tabs tablet Commonly known as: OSCAL Take 600 mg of elemental calcium by mouth daily with breakfast.   dexamethasone 2 MG tablet Commonly known as: DECADRON TAKE 2 TABLETS BY MOUTH DAY AFTER CHEMO THEN 1 TABLET EVERY DAY FOR 4 DAYS What changed:   how much to take  how to take this  when to take this  additional instructions   dronabinol 2.5 MG capsule Commonly known as: MARINOL Take 1 capsule (2.5 mg total) by mouth 2 (two) times daily before a meal.   famciclovir 250 MG tablet Commonly known as: FAMVIR Take 1 tablet (250 mg total) by mouth daily.   Fish Oil 1200 MG Caps Take 1,200 mg by mouth daily.   fluticasone 50 MCG/ACT nasal spray Commonly known as: FLONASE Place 1 spray into both nostrils at bedtime.   methocarbamol 500 MG tablet Commonly known as: ROBAXIN Take 1 tablet (500 mg total) by mouth 3 (three) times daily as needed for muscle  spasms.   metoprolol tartrate 50 MG tablet Commonly known as: LOPRESSOR Take 1 tablet (50 mg total) by mouth 2 (two) times daily.   multivitamin tablet Take 1 tablet by mouth daily.   ondansetron 8 MG tablet Commonly known as: ZOFRAN Take 1 tablet (8 mg total) by mouth every 8 (eight) hours as needed for nausea or vomiting.   oxybutynin 5 MG 24 hr tablet Commonly known as: DITROPAN-XL Take 5 mg by mouth daily.   pantoprazole 40 MG tablet Commonly known as: PROTONIX Take 1 tablet (40 mg total) by mouth at bedtime.   PROBIOTIC PO Take 1 tablet by mouth daily.   selinexor 20 MG Tbpk Take 100 mg by mouth once a week.   tamsulosin 0.4 MG Caps capsule Commonly known as: FLOMAX Take 0.4 mg by mouth daily.   traMADol 50 MG  tablet Commonly known as: ULTRAM Take 1 tablet (50 mg total) by mouth every 6 (six) hours as needed.   VITAMIN B-12 PO Take 5,000 mcg by mouth daily.       Allergies: No Known Allergies  Past Medical History, Surgical history, Social history, and Family History were reviewed and updated.  Review of Systems: All other 10 point review of systems is negative.   Physical Exam:  vitals were not taken for this visit.   Wt Readings from Last 3 Encounters:  05/15/19 210 lb (95.3 kg)  05/06/19 203 lb (92.1 kg)  05/06/19 203 lb 12 oz (92.4 kg)    Ocular: Sclerae unicteric, pupils equal, round and reactive to light Ear-nose-throat: Oropharynx clear, dentition fair Lymphatic: No cervical or supraclavicular adenopathy Lungs no rales or rhonchi, good excursion bilaterally Heart regular rate and rhythm, no murmur appreciated Abd soft, nontender, positive bowel sounds, no liver or spleen tip palpated on exam, no fluid wave  MSK no focal spinal tenderness, no joint edema Neuro: non-focal, well-oriented, appropriate affect Breasts: Deferred   Lab Results  Component Value Date   WBC 2.4 (L) 06/01/2019   HGB 8.8 (L) 06/01/2019   HCT 26.7 (L) 06/01/2019   MCV 101.1 (H) 06/01/2019   PLT 77 (L) 06/01/2019   Lab Results  Component Value Date   FERRITIN 128 05/06/2019   IRON 99 05/06/2019   TIBC 308 05/06/2019   UIBC 208 05/06/2019   IRONPCTSAT 32 05/06/2019   Lab Results  Component Value Date   RETICCTPCT 2.1 02/25/2019   RBC 2.64 (L) 06/01/2019   Lab Results  Component Value Date   KPAFRELGTCHN 846.9 (H) 05/06/2019   LAMBDASER 2.5 (L) 05/06/2019   KAPLAMBRATIO 338.76 (H) 05/06/2019   Lab Results  Component Value Date   IGGSERUM 114 (L) 05/06/2019   IGA 1,373 (H) 05/06/2019   IGMSERUM <5 (L) 05/06/2019   Lab Results  Component Value Date   TOTALPROTELP 6.5 05/06/2019   ALBUMINELP 3.5 05/06/2019   A1GS 0.2 05/06/2019   A2GS 0.8 05/06/2019   BETS 1.7 (H) 05/06/2019    BETA2SER 2.0 (H) 12/08/2014   GAMS 0.3 (L) 05/06/2019   MSPIKE 0.7 (H) 05/06/2019   SPEI Comment 09/02/2017     Chemistry      Component Value Date/Time   NA 137 06/01/2019 1045   NA 141 01/21/2017 0741   NA 139 06/14/2016 1246   K 4.1 06/01/2019 1045   K 4.0 01/21/2017 0741   K 3.8 06/14/2016 1246   CL 102 06/01/2019 1045   CL 103 01/21/2017 0741   CO2 25 06/01/2019 1045   CO2 30 01/21/2017  0741   CO2 24 06/14/2016 1246   BUN 18 06/01/2019 1045   BUN 20 01/21/2017 0741   BUN 15.0 06/14/2016 1246   CREATININE 0.98 06/01/2019 1045   CREATININE 1.0 01/21/2017 0741   CREATININE 0.9 06/14/2016 1246      Component Value Date/Time   CALCIUM 9.5 06/01/2019 1045   CALCIUM 9.0 01/21/2017 0741   CALCIUM 9.6 06/14/2016 1246   ALKPHOS 33 (L) 06/01/2019 1045   ALKPHOS 37 01/21/2017 0741   ALKPHOS 54 06/14/2016 1246   AST 14 (L) 06/01/2019 1045   AST 25 06/14/2016 1246   ALT 13 06/01/2019 1045   ALT 29 01/21/2017 0741   ALT 21 06/14/2016 1246   BILITOT 0.3 06/01/2019 1045   BILITOT 0.37 06/14/2016 1246       Impression and Plan: Mr. Biter is a very pleasant 80 yo caucasian gentleman with history of IgA kappa myeloma, high risk cytogenetics with a 17p- abnormality.  He is symptomatic with anemia today. We will hold treatment today and have him hold this weeks dose of Selinexor per Dr. Marin Olp.  We will give him 2 units of blood tomorrow and plan to see him back in another 3 weeks.  They are in agreement with the plan and will contact our office with any questions or concerns. We can certainly see him sooner if needed.   [name], NP 5/17/202111:17 AM

## 2019-06-08 NOTE — Patient Instructions (Signed)

## 2019-06-08 NOTE — Progress Notes (Signed)
Type and cross match drawn for blood transfusion 06/09/2019

## 2019-06-08 NOTE — Progress Notes (Signed)
Pharmacist Chemotherapy Monitoring - Follow Up Assessment    I verify that I have reviewed each item in the below checklist:  . Regimen for the patient is scheduled for the appropriate day and plan matches scheduled date. Marland Kitchen Appropriate non-routine labs are ordered dependent on drug ordered. . If applicable, additional medications reviewed and ordered per protocol based on lifetime cumulative doses and/or treatment regimen.   Plan for follow-up and/or issues identified: No . I-vent associated with next due treatment: No . MD and/or nursing notified: No   Kennith Center, Pharm.D., CPP 06/08/2019@3 :05 PM

## 2019-06-09 ENCOUNTER — Other Ambulatory Visit: Payer: Self-pay | Admitting: Hematology & Oncology

## 2019-06-09 ENCOUNTER — Inpatient Hospital Stay: Payer: PPO

## 2019-06-09 ENCOUNTER — Telehealth: Payer: Self-pay | Admitting: Family

## 2019-06-09 DIAGNOSIS — D649 Anemia, unspecified: Secondary | ICD-10-CM

## 2019-06-09 DIAGNOSIS — C9000 Multiple myeloma not having achieved remission: Secondary | ICD-10-CM

## 2019-06-09 DIAGNOSIS — Z5112 Encounter for antineoplastic immunotherapy: Secondary | ICD-10-CM | POA: Diagnosis not present

## 2019-06-09 LAB — KAPPA/LAMBDA LIGHT CHAINS
Kappa free light chain: 950.5 mg/L — ABNORMAL HIGH (ref 3.3–19.4)
Kappa, lambda light chain ratio: 297.03 — ABNORMAL HIGH (ref 0.26–1.65)
Lambda free light chains: 3.2 mg/L — ABNORMAL LOW (ref 5.7–26.3)

## 2019-06-09 LAB — IGG, IGA, IGM
IgA: 1450 mg/dL — ABNORMAL HIGH (ref 61–437)
IgG (Immunoglobin G), Serum: 83 mg/dL — ABNORMAL LOW (ref 603–1613)
IgM (Immunoglobulin M), Srm: <5 mg/dL — ABNORMAL LOW (ref 15–143)

## 2019-06-09 MED ORDER — SODIUM CHLORIDE 0.9% FLUSH
10.0000 mL | INTRAVENOUS | Status: AC | PRN
  Administered 2019-06-09: 10 mL
  Filled 2019-06-09: qty 10

## 2019-06-09 MED ORDER — ACETAMINOPHEN 325 MG PO TABS: 650.0000 mg | ORAL_TABLET | Freq: Once | ORAL | Status: DC

## 2019-06-09 MED ORDER — SODIUM CHLORIDE 0.9% IV SOLUTION
250.0000 mL | Freq: Once | INTRAVENOUS | Status: AC
  Administered 2019-06-09: 250 mL via INTRAVENOUS
  Filled 2019-06-09: qty 250

## 2019-06-09 MED ORDER — DIPHENHYDRAMINE HCL 25 MG PO CAPS
25.0000 mg | ORAL_CAPSULE | Freq: Once | ORAL | Status: AC
  Administered 2019-06-09: 25 mg via ORAL

## 2019-06-09 MED ORDER — ACETAMINOPHEN 325 MG PO TABS
ORAL_TABLET | ORAL | Status: AC
  Filled 2019-06-09: qty 2

## 2019-06-09 MED ORDER — HEPARIN SOD (PORK) LOCK FLUSH 100 UNIT/ML IV SOLN
250.0000 [IU] | INTRAVENOUS | Status: AC | PRN
  Administered 2019-06-09: 500 [IU]
  Filled 2019-06-09: qty 5

## 2019-06-09 MED ORDER — DIPHENHYDRAMINE HCL 25 MG PO CAPS
ORAL_CAPSULE | ORAL | Status: AC
  Filled 2019-06-09: qty 1

## 2019-06-09 NOTE — Patient Instructions (Signed)

## 2019-06-09 NOTE — Telephone Encounter (Signed)
Appointments scheduled calendar was printed per 5/17 los

## 2019-06-10 ENCOUNTER — Other Ambulatory Visit: Payer: PPO

## 2019-06-10 ENCOUNTER — Ambulatory Visit: Payer: PPO

## 2019-06-10 LAB — BPAM RBC
Blood Product Expiration Date: 202106062359
Blood Product Expiration Date: 202106092359
ISSUE DATE / TIME: 202105180803
ISSUE DATE / TIME: 202105180803
Unit Type and Rh: 6200
Unit Type and Rh: 6200

## 2019-06-10 LAB — TYPE AND SCREEN
ABO/RH(D): A POS
Antibody Screen: POSITIVE
Donor AG Type: NEGATIVE
Donor AG Type: NEGATIVE
Unit division: 0
Unit division: 0

## 2019-06-11 ENCOUNTER — Telehealth: Payer: Self-pay

## 2019-06-11 ENCOUNTER — Ambulatory Visit (HOSPITAL_COMMUNITY)
Admission: RE | Admit: 2019-06-11 | Discharge: 2019-06-11 | Disposition: A | Discharge status: Home / Self Care | Payer: PPO | Source: Ambulatory Visit | Attending: Nurse Practitioner | Admitting: Nurse Practitioner

## 2019-06-11 ENCOUNTER — Other Ambulatory Visit: Payer: Self-pay

## 2019-06-11 DIAGNOSIS — N281 Cyst of kidney, acquired: Secondary | ICD-10-CM

## 2019-06-11 LAB — PROTEIN ELECTROPHORESIS, SERUM, WITH REFLEX
A/G Ratio: 1 (ref 0.7–1.7)
Albumin ELP: 3.5 g/dL (ref 2.9–4.4)
Alpha-1-Globulin: 0.2 g/dL (ref 0.0–0.4)
Alpha-2-Globulin: 0.9 g/dL (ref 0.4–1.0)
Beta Globulin: 1.8 g/dL — ABNORMAL HIGH (ref 0.7–1.3)
Gamma Globulin: 0.4 g/dL (ref 0.4–1.8)
Globulin, Total: 3.4 g/dL (ref 2.2–3.9)
M-Spike, %: 0.9 g/dL — ABNORMAL HIGH
SPEP Interpretation: 0
Total Protein ELP: 6.9 g/dL (ref 6.0–8.5)

## 2019-06-11 LAB — IMMUNOFIXATION REFLEX, SERUM
IgA: 1538 mg/dL — ABNORMAL HIGH (ref 61–437)
IgG (Immunoglobin G), Serum: 78 mg/dL — ABNORMAL LOW (ref 603–1613)
IgM (Immunoglobulin M), Srm: <5 mg/dL — ABNORMAL LOW (ref 15–143)

## 2019-06-11 MED ORDER — GADOBUTROL 1 MMOL/ML IV SOLN
10.0000 mL | Freq: Once | INTRAVENOUS | Status: AC | PRN
  Administered 2019-06-11: 10 mL via INTRAVENOUS

## 2019-06-11 NOTE — Telephone Encounter (Signed)
-----   Message from [name], MD sent at 06/11/2019  9:51 AM EDT ----- Call - the myeloma is up just a tad.  I am not worried yet.  Need to keep on the [place] longer!!  Laurey Arrow

## 2019-06-11 NOTE — Telephone Encounter (Signed)
Pt.'s wife answered tho phone. I informed her that Gene Hill's myeloma is up just a tad, that Dr. Marin Olp is not worried yet and that her husband needs to keep on Silenexor longer. She verbalized understanding. She also told me that the patient had an MRI today that was ordered by his urologist. Dr. Marin Olp made aware.

## 2019-06-15 ENCOUNTER — Inpatient Hospital Stay: Payer: PPO

## 2019-06-17 ENCOUNTER — Ambulatory Visit: Payer: PPO

## 2019-06-17 ENCOUNTER — Other Ambulatory Visit: Payer: PPO

## 2019-06-17 ENCOUNTER — Other Ambulatory Visit: Payer: Self-pay | Admitting: Internal Medicine

## 2019-06-19 DIAGNOSIS — R3914 Feeling of incomplete bladder emptying: Secondary | ICD-10-CM | POA: Diagnosis not present

## 2019-06-19 DIAGNOSIS — R351 Nocturia: Secondary | ICD-10-CM | POA: Diagnosis not present

## 2019-06-23 NOTE — Progress Notes (Unsigned)
Pharmacist Chemotherapy Monitoring - Follow Up Assessment    I verify that I have reviewed each item in the below checklist:  . Regimen for the patient is scheduled for the appropriate day and plan matches scheduled date. Marland Kitchen Appropriate non-routine labs are ordered dependent on drug ordered. . If applicable, additional medications reviewed and ordered per protocol based on lifetime cumulative doses and/or treatment regimen.   Plan for follow-up and/or issues identified: No . I-vent associated with next due treatment: No . MD and/or nursing notified: No  Gene Hill, Gene Hill 06/23/2019 8:30 AM

## 2019-06-25 DIAGNOSIS — M25511 Pain in right shoulder: Secondary | ICD-10-CM | POA: Diagnosis not present

## 2019-06-25 DIAGNOSIS — M25512 Pain in left shoulder: Secondary | ICD-10-CM | POA: Diagnosis not present

## 2019-06-30 ENCOUNTER — Inpatient Hospital Stay: Payer: PPO | Attending: Hematology & Oncology

## 2019-06-30 ENCOUNTER — Other Ambulatory Visit: Payer: Self-pay

## 2019-06-30 ENCOUNTER — Inpatient Hospital Stay: Payer: PPO

## 2019-06-30 ENCOUNTER — Encounter: Payer: Self-pay | Admitting: Family

## 2019-06-30 ENCOUNTER — Inpatient Hospital Stay (HOSPITAL_BASED_OUTPATIENT_CLINIC_OR_DEPARTMENT_OTHER): Payer: PPO | Admitting: Family

## 2019-06-30 VITALS — BP 131/63 | HR 64 | Temp 97.1°F | Resp 18 | Ht 65.0 in | Wt 197.1 lb

## 2019-06-30 DIAGNOSIS — R531 Weakness: Secondary | ICD-10-CM | POA: Diagnosis not present

## 2019-06-30 DIAGNOSIS — G629 Polyneuropathy, unspecified: Secondary | ICD-10-CM | POA: Diagnosis not present

## 2019-06-30 DIAGNOSIS — D508 Other iron deficiency anemias: Secondary | ICD-10-CM | POA: Diagnosis not present

## 2019-06-30 DIAGNOSIS — C9002 Multiple myeloma in relapse: Secondary | ICD-10-CM | POA: Diagnosis not present

## 2019-06-30 DIAGNOSIS — R5383 Other fatigue: Secondary | ICD-10-CM | POA: Diagnosis not present

## 2019-06-30 DIAGNOSIS — Z95828 Presence of other vascular implants and grafts: Secondary | ICD-10-CM

## 2019-06-30 DIAGNOSIS — Z79899 Other long term (current) drug therapy: Secondary | ICD-10-CM | POA: Diagnosis not present

## 2019-06-30 DIAGNOSIS — D72819 Decreased white blood cell count, unspecified: Secondary | ICD-10-CM | POA: Insufficient documentation

## 2019-06-30 DIAGNOSIS — C9000 Multiple myeloma not having achieved remission: Secondary | ICD-10-CM | POA: Diagnosis not present

## 2019-06-30 DIAGNOSIS — D649 Anemia, unspecified: Secondary | ICD-10-CM | POA: Diagnosis not present

## 2019-06-30 LAB — CMP (CANCER CENTER ONLY)
ALT: 13 U/L (ref 0–44)
AST: 19 U/L (ref 15–41)
Albumin: 4 g/dL (ref 3.5–5.0)
Alkaline Phosphatase: 27 U/L — ABNORMAL LOW (ref 38–126)
Anion gap: 9 (ref 5–15)
BUN: 9 mg/dL (ref 8–23)
CO2: 25 mmol/L (ref 22–32)
Calcium: 9.2 mg/dL (ref 8.9–10.3)
Chloride: 102 mmol/L (ref 98–111)
Creatinine: 0.99 mg/dL (ref 0.61–1.24)
GFR, Est AFR Am: >60 mL/min (ref >60)
GFR, Estimated: >60 mL/min (ref >60)
Glucose, Bld: 104 mg/dL — ABNORMAL HIGH (ref 70–99)
Potassium: 3.8 mmol/L (ref 3.5–5.1)
Sodium: 136 mmol/L (ref 135–145)
Total Bilirubin: 0.4 mg/dL (ref 0.3–1.2)
Total Protein: 6.9 g/dL (ref 6.5–8.1)

## 2019-06-30 LAB — CBC WITH DIFFERENTIAL (CANCER CENTER ONLY)
Abs Immature Granulocytes: 0.02 10*3/uL (ref 0.00–0.07)
Basophils Absolute: 0 10*3/uL (ref 0.0–0.1)
Basophils Relative: 0 %
Eosinophils Absolute: 0 10*3/uL (ref 0.0–0.5)
Eosinophils Relative: 0 %
HCT: 26.2 % — ABNORMAL LOW (ref 39.0–52.0)
Hemoglobin: 8.8 g/dL — ABNORMAL LOW (ref 13.0–17.0)
Immature Granulocytes: 1 %
Lymphocytes Relative: 46 %
Lymphs Abs: 0.9 10*3/uL (ref 0.7–4.0)
MCH: 33.2 pg (ref 26.0–34.0)
MCHC: 33.6 g/dL (ref 30.0–36.0)
MCV: 98.9 fL (ref 80.0–100.0)
Monocytes Absolute: 0.3 10*3/uL (ref 0.1–1.0)
Monocytes Relative: 13 %
Neutro Abs: 0.8 10*3/uL — ABNORMAL LOW (ref 1.7–7.7)
Neutrophils Relative %: 40 %
Platelet Count: 32 10*3/uL — ABNORMAL LOW (ref 150–400)
RBC: 2.65 MIL/uL — ABNORMAL LOW (ref 4.22–5.81)
RDW: 15.1 % (ref 11.5–15.5)
WBC Count: 1.9 10*3/uL — ABNORMAL LOW (ref 4.0–10.5)
nRBC: 0 % (ref 0.0–0.2)

## 2019-06-30 LAB — LACTATE DEHYDROGENASE: LDH: 190 U/L (ref 98–192)

## 2019-06-30 LAB — SAMPLE TO BLOOD BANK

## 2019-06-30 MED ORDER — SODIUM CHLORIDE 0.9% FLUSH
10.0000 mL | Freq: Once | INTRAVENOUS | Status: AC
  Administered 2019-06-30: 10 mL via INTRAVENOUS
  Filled 2019-06-30: qty 10

## 2019-06-30 MED ORDER — HEPARIN SOD (PORK) LOCK FLUSH 100 UNIT/ML IV SOLN
500.0000 [IU] | Freq: Once | INTRAVENOUS | Status: AC
  Administered 2019-06-30: 500 [IU] via INTRAVENOUS
  Filled 2019-06-30: qty 5

## 2019-06-30 NOTE — Patient Instructions (Signed)
No treatment. Pt discharged. Follow up as scheduled.

## 2019-06-30 NOTE — Progress Notes (Signed)
Hematology and Oncology Follow Up Visit  Gene Hill [id number] 1940/05/08 79 y.o. 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 is here today with his wife for follow-up and treatment. He is having a hard time on Selinexor. He has no appetite and has lost another 9 lbs since his last visit (06/08/2019). He is quite fatigued and weak.  He has not noted any episodes of bleeding. No bruising or petechiae.  His Hgb is 8.8, WBC count 1.9, ANC 0.8 and platelet count 32.  May M-spike was 0.9, IgA level 1,450 mg/dL and kappa light chains 95.05 mg/dL  He has not noted any fever, chills, n/v, cough, rash, dizziness, chest pain, palpitations, abdominal pain or changes in bowel or bladder habits.  No swelling in his extremities at this time.  The neuropathy in his hands and feet is unchanged.  No falls or syncopal episodes to report. He ambulates with a cane for added support.   ECOG Performance Status: 1 - Symptomatic but completely ambulatory  Medications:  Allergies as of 06/30/2019   No Known Allergies     Medication List       Accurate as of June 30, 2019 11:12 AM. If you have any questions, ask your nurse or doctor.        STOP taking these medications   fluticasone 50 MCG/ACT nasal spray Commonly known as: FLONASE Stopped by: [name], NP     TAKE these medications   acetaminophen 500 MG tablet Commonly known as: TYLENOL Take 1,000 mg by mouth 3 (three) times daily as needed.   aspirin 325 MG tablet Take 325 mg by mouth daily.     atorvastatin 10 MG tablet Commonly known as: LIPITOR Take 1 tablet (10 mg total) by mouth at bedtime.   b complex vitamins tablet Take 1 tablet by mouth daily.   calcium carbonate 1500 (600 Ca) MG Tabs tablet Commonly known as: OSCAL Take 600 mg of elemental calcium by mouth daily with breakfast.   dexamethasone 2 MG tablet Commonly known as: DECADRON TAKE 2 TABLETS BY MOUTH DAY AFTER CHEMO THEN 1 TABLET EVERY DAY FOR 4 DAYS   diazepam 10 MG tablet Commonly known as: VALIUM Take one tablet PO one hour prior to MRI.   dronabinol 2.5 MG capsule Commonly known as: MARINOL Take 1 capsule (2.5 mg total) by mouth 2 (two) times daily before a meal.   famciclovir 250 MG tablet Commonly known as: FAMVIR Take 1 tablet (250 mg total) by mouth daily.   Fish Oil 1200 MG Caps Take 1,200 mg by mouth daily.   methocarbamol 500 MG tablet Commonly known as: ROBAXIN Take 1 tablet (500 mg total) by mouth 3 (three) times daily as needed for muscle spasms.   metoprolol tartrate 50 MG tablet Commonly known as: LOPRESSOR Take 1 tablet (50 mg total) by mouth 2 (two) times daily.   multivitamin tablet Take 1 tablet by mouth daily.   ondansetron 8 MG tablet Commonly known as: ZOFRAN Take 1 tablet (8 mg total) by  mouth every 8 (eight) hours as needed for nausea or vomiting.   oxybutynin 10 MG 24 hr tablet Commonly known as: DITROPAN-XL Take 10 mg by mouth daily. What changed: Another medication with the same name was removed. Continue taking this medication, and follow the directions you see here. Changed by: [name], NP   pantoprazole 40 MG tablet Commonly known as: PROTONIX Take 1 tablet (40 mg total) by mouth at bedtime.   PROBIOTIC PO Take 1 tablet by mouth daily.   selinexor 20 MG Tbpk Take 100 mg by mouth once a week.   tamsulosin 0.4 MG Caps capsule Commonly known as: FLOMAX Take 0.4 mg by mouth daily.   traMADol 50 MG tablet Commonly known as: ULTRAM Take 1  tablet (50 mg total) by mouth every 6 (six) hours as needed.   VITAMIN B-12 PO Take 5,000 mcg by mouth daily.       Allergies: No Known Allergies  Past Medical History, Surgical history, Social history, and Family History were reviewed and updated.  Review of Systems: All other 10 point review of systems is negative.   Physical Exam:  height is 5\' 5"  (1.651 m) and weight is 197 lb 1.3 oz (89.4 kg). His temporal temperature is 97.1 F (36.2 C) (abnormal). His blood pressure is 131/63 and his pulse is 64. His respiration is 18 and oxygen saturation is 99%.   Wt Readings from Last 3 Encounters:  06/30/19 197 lb 1.3 oz (89.4 kg)  06/08/19 206 lb 0.6 oz (93.5 kg)  05/15/19 210 lb (95.3 kg)    Ocular: Sclerae unicteric, pupils equal, round and reactive to light Ear-nose-throat: Oropharynx clear, dentition fair Lymphatic: No cervical or supraclavicular adenopathy Lungs no rales or rhonchi, good excursion bilaterally Heart regular rate and rhythm, no murmur appreciated Abd soft, nontender, positive bowel sounds, no liver or spleen tip palpated on exam, no fluid wave  MSK no focal spinal tenderness, no joint edema Neuro: non-focal, well-oriented, appropriate affect Breasts: Deferred   Lab Results  Component Value Date   WBC 1.9 (L) 06/30/2019   HGB 8.8 (L) 06/30/2019   HCT 26.2 (L) 06/30/2019   MCV 98.9 06/30/2019   PLT 32 (L) 06/30/2019   Lab Results  Component Value Date   FERRITIN 128 05/06/2019   IRON 99 05/06/2019   TIBC 308 05/06/2019   UIBC 208 05/06/2019   IRONPCTSAT 32 05/06/2019   Lab Results  Component Value Date   RETICCTPCT 2.1 02/25/2019   RBC 2.65 (L) 06/30/2019   Lab Results  Component Value Date   KPAFRELGTCHN 950.5 (H) 06/08/2019   LAMBDASER 3.2 (L) 06/08/2019   KAPLAMBRATIO 297.03 (H) 06/08/2019   Lab Results  Component Value Date   IGGSERUM 83 (L) 06/08/2019   IGGSERUM 78 (L) 06/08/2019   IGA 1,450 (H) 06/08/2019   IGA 1,538 (H) 06/08/2019    IGMSERUM <5 (L) 06/08/2019   IGMSERUM <5 (L) 06/08/2019   Lab Results  Component Value Date   TOTALPROTELP 6.9 06/08/2019   ALBUMINELP 3.5 06/08/2019   A1GS 0.2 06/08/2019   A2GS 0.9 06/08/2019   BETS 1.8 (H) 06/08/2019   BETA2SER 2.0 (H) 12/08/2014   GAMS 0.4 06/08/2019   MSPIKE 0.9 (H) 06/08/2019   SPEI Comment 09/02/2017     Chemistry      Component Value Date/Time   NA 136 06/30/2019 1023   NA 141 01/21/2017 0741   NA 139 06/14/2016 1246   K 3.8 06/30/2019 1023   K 4.0 01/21/2017 0741  K 3.8 06/14/2016 1246   CL 102 06/30/2019 1023   CL 103 01/21/2017 0741   CO2 25 06/30/2019 1023   CO2 30 01/21/2017 0741   CO2 24 06/14/2016 1246   BUN 9 06/30/2019 1023   BUN 20 01/21/2017 0741   BUN 15.0 06/14/2016 1246   CREATININE 0.99 06/30/2019 1023   CREATININE 1.0 01/21/2017 0741   CREATININE 0.9 06/14/2016 1246      Component Value Date/Time   CALCIUM 9.2 06/30/2019 1023   CALCIUM 9.0 01/21/2017 0741   CALCIUM 9.6 06/14/2016 1246   ALKPHOS 27 (L) 06/30/2019 1023   ALKPHOS 37 01/21/2017 0741   ALKPHOS 54 06/14/2016 1246   AST 19 06/30/2019 1023   AST 25 06/14/2016 1246   ALT 13 06/30/2019 1023   ALT 29 01/21/2017 0741   ALT 21 06/14/2016 1246   BILITOT 0.4 06/30/2019 1023   BILITOT 0.37 06/14/2016 1246       Impression and Plan: [name] is a very pleasant 79 yo caucasian gentleman with history of IgA kappa myeloma, high risk cytogenetics with a 17p- abnormality.  He continues to decline on Silenexor as mentioned above. I spoke with Dr. Marin Olp and we will have him stop this and not treat him with Velcade today.  He will continue the Marinol and work on maintaining his weight.  We will plan to see him again in another 3 weeks for MD follow-up.  They are in agreement with the plan and will contact our office with any questions or concerns. We can certainly see him sooner if needed.   [name], NP 6/8/202111:12 AM

## 2019-06-30 NOTE — Addendum Note (Signed)
Addended by: Lucile Crater on: 06/30/2019 11:54 AM   Modules accepted: Orders

## 2019-06-30 NOTE — Progress Notes (Signed)
No treatment per Judson Roch, [place]

## 2019-06-30 NOTE — Progress Notes (Signed)
Per MD no treatment today, port flushed per protocol.

## 2019-07-01 ENCOUNTER — Other Ambulatory Visit: Payer: Self-pay | Admitting: Hematology & Oncology

## 2019-07-01 LAB — PROTEIN ELECTROPHORESIS, SERUM
A/G Ratio: 1 (ref 0.7–1.7)
Albumin ELP: 3.5 g/dL (ref 2.9–4.4)
Alpha-1-Globulin: 0.2 g/dL (ref 0.0–0.4)
Alpha-2-Globulin: 0.9 g/dL (ref 0.4–1.0)
Beta Globulin: 1.9 g/dL — ABNORMAL HIGH (ref 0.7–1.3)
Gamma Globulin: 0.4 g/dL (ref 0.4–1.8)
Globulin, Total: 3.5 g/dL (ref 2.2–3.9)
M-Spike, %: 1 g/dL — ABNORMAL HIGH
Total Protein ELP: 7 g/dL (ref 6.0–8.5)

## 2019-07-01 LAB — KAPPA/LAMBDA LIGHT CHAINS
Kappa free light chain: 1026.8 mg/L — ABNORMAL HIGH (ref 3.3–19.4)
Kappa, lambda light chain ratio: 354.07 — ABNORMAL HIGH (ref 0.26–1.65)
Lambda free light chains: 2.9 mg/L — ABNORMAL LOW (ref 5.7–26.3)

## 2019-07-02 LAB — IGG, IGA, IGM
IgA: 1879 mg/dL — ABNORMAL HIGH (ref 61–437)
IgG (Immunoglobin G), Serum: 77 mg/dL — ABNORMAL LOW (ref 603–1613)
IgM (Immunoglobulin M), Srm: <5 mg/dL — ABNORMAL LOW (ref 15–143)

## 2019-07-07 ENCOUNTER — Other Ambulatory Visit: Payer: Self-pay | Admitting: *Deleted

## 2019-07-07 MED ORDER — FAMCICLOVIR 250 MG PO TABS: 250.0000 mg | ORAL_TABLET | Freq: Every day | ORAL | 2 refills | Status: DC

## 2019-07-13 DIAGNOSIS — M25561 Pain in right knee: Secondary | ICD-10-CM | POA: Diagnosis not present

## 2019-07-21 ENCOUNTER — Other Ambulatory Visit: Payer: Self-pay | Admitting: *Deleted

## 2019-07-21 ENCOUNTER — Inpatient Hospital Stay (HOSPITAL_BASED_OUTPATIENT_CLINIC_OR_DEPARTMENT_OTHER): Payer: PPO | Admitting: Hematology & Oncology

## 2019-07-21 ENCOUNTER — Inpatient Hospital Stay: Payer: PPO

## 2019-07-21 ENCOUNTER — Encounter: Payer: Self-pay | Admitting: Hematology & Oncology

## 2019-07-21 ENCOUNTER — Telehealth: Payer: Self-pay | Admitting: *Deleted

## 2019-07-21 ENCOUNTER — Ambulatory Visit: Payer: PPO

## 2019-07-21 ENCOUNTER — Other Ambulatory Visit: Payer: Self-pay

## 2019-07-21 VITALS — BP 123/65 | HR 71 | Temp 97.8°F | Resp 18 | Wt 198.2 lb

## 2019-07-21 DIAGNOSIS — D508 Other iron deficiency anemias: Secondary | ICD-10-CM

## 2019-07-21 DIAGNOSIS — D649 Anemia, unspecified: Secondary | ICD-10-CM

## 2019-07-21 DIAGNOSIS — C9000 Multiple myeloma not having achieved remission: Secondary | ICD-10-CM

## 2019-07-21 DIAGNOSIS — Z95828 Presence of other vascular implants and grafts: Secondary | ICD-10-CM

## 2019-07-21 DIAGNOSIS — C9002 Multiple myeloma in relapse: Secondary | ICD-10-CM | POA: Diagnosis not present

## 2019-07-21 LAB — LACTATE DEHYDROGENASE: LDH: 204 U/L — ABNORMAL HIGH (ref 98–192)

## 2019-07-21 LAB — CBC WITH DIFFERENTIAL (CANCER CENTER ONLY)
Abs Immature Granulocytes: 0.01 10*3/uL (ref 0.00–0.07)
Basophils Absolute: 0 10*3/uL (ref 0.0–0.1)
Basophils Relative: 0 %
Eosinophils Absolute: 0 10*3/uL (ref 0.0–0.5)
Eosinophils Relative: 1 %
HCT: 18.6 % — ABNORMAL LOW (ref 39.0–52.0)
Hemoglobin: 6.1 g/dL — CL (ref 13.0–17.0)
Immature Granulocytes: 1 %
Lymphocytes Relative: 50 %
Lymphs Abs: 0.9 10*3/uL (ref 0.7–4.0)
MCH: 33.5 pg (ref 26.0–34.0)
MCHC: 32.8 g/dL (ref 30.0–36.0)
MCV: 102.2 fL — ABNORMAL HIGH (ref 80.0–100.0)
Monocytes Absolute: 0.2 10*3/uL (ref 0.1–1.0)
Monocytes Relative: 13 %
Neutro Abs: 0.6 10*3/uL — ABNORMAL LOW (ref 1.7–7.7)
Neutrophils Relative %: 35 %
Platelet Count: 15 10*3/uL — ABNORMAL LOW (ref 150–400)
RBC: 1.82 MIL/uL — ABNORMAL LOW (ref 4.22–5.81)
RDW: 16 % — ABNORMAL HIGH (ref 11.5–15.5)
WBC Count: 1.8 10*3/uL — ABNORMAL LOW (ref 4.0–10.5)
nRBC: 0 % (ref 0.0–0.2)

## 2019-07-21 LAB — SAMPLE TO BLOOD BANK

## 2019-07-21 LAB — CMP (CANCER CENTER ONLY)
ALT: 11 U/L (ref 0–44)
AST: 14 U/L — ABNORMAL LOW (ref 15–41)
Albumin: 3.8 g/dL (ref 3.5–5.0)
Alkaline Phosphatase: 39 U/L (ref 38–126)
Anion gap: 10 (ref 5–15)
BUN: 15 mg/dL (ref 8–23)
CO2: 25 mmol/L (ref 22–32)
Calcium: 8.9 mg/dL (ref 8.9–10.3)
Chloride: 102 mmol/L (ref 98–111)
Creatinine: 0.85 mg/dL (ref 0.61–1.24)
GFR, Est AFR Am: >60 mL/min (ref >60)
GFR, Estimated: >60 mL/min (ref >60)
Glucose, Bld: 105 mg/dL — ABNORMAL HIGH (ref 70–99)
Potassium: 3.9 mmol/L (ref 3.5–5.1)
Sodium: 137 mmol/L (ref 135–145)
Total Bilirubin: 0.4 mg/dL (ref 0.3–1.2)
Total Protein: 7.1 g/dL (ref 6.5–8.1)

## 2019-07-21 LAB — IRON AND TIBC
Iron: 195 ug/dL — ABNORMAL HIGH (ref 42–163)
Saturation Ratios: 83 % — ABNORMAL HIGH (ref 20–55)
TIBC: 235 ug/dL (ref 202–409)
UIBC: 40 ug/dL — ABNORMAL LOW (ref 117–376)

## 2019-07-21 LAB — FERRITIN: Ferritin: 468 ng/mL — ABNORMAL HIGH (ref 24–336)

## 2019-07-21 LAB — PREPARE RBC (CROSSMATCH)

## 2019-07-21 MED ORDER — PEGFILGRASTIM-CBQV 6 MG/0.6ML ~~LOC~~ SOSY
PREFILLED_SYRINGE | SUBCUTANEOUS | Status: AC
  Filled 2019-07-21: qty 0.6

## 2019-07-21 MED ORDER — PEGFILGRASTIM-CBQV 6 MG/0.6ML ~~LOC~~ SOSY
6.0000 mg | PREFILLED_SYRINGE | Freq: Once | SUBCUTANEOUS | Status: AC
  Administered 2019-07-21: 6 mg via SUBCUTANEOUS

## 2019-07-21 MED ORDER — DIPHENHYDRAMINE HCL 25 MG PO CAPS
25.0000 mg | ORAL_CAPSULE | Freq: Once | ORAL | Status: AC
  Administered 2019-07-21: 25 mg via ORAL

## 2019-07-21 MED ORDER — HEPARIN SOD (PORK) LOCK FLUSH 100 UNIT/ML IV SOLN
500.0000 [IU] | Freq: Once | INTRAVENOUS | Status: AC
  Administered 2019-07-21: 500 [IU] via INTRAVENOUS
  Filled 2019-07-21: qty 5

## 2019-07-21 MED ORDER — SODIUM CHLORIDE 0.9% FLUSH
10.0000 mL | INTRAVENOUS | Status: DC | PRN
  Administered 2019-07-21: 10 mL via INTRAVENOUS
  Filled 2019-07-21: qty 10

## 2019-07-21 MED ORDER — DIPHENHYDRAMINE HCL 25 MG PO CAPS
ORAL_CAPSULE | ORAL | Status: AC
  Filled 2019-07-21: qty 1

## 2019-07-21 NOTE — Patient Instructions (Addendum)
https://www.[facility].org/donate-blood/blood-donation-process/what-happens-to-donated-blood/blood-transfusions/types-of-[email or web address].html"> https://www.hematology.org/education/patients/blood-basics/blood-safety-and-matching"> https://www.nhlbi.nih.gov/health-topics/blood-transfusion">  Blood Transfusion, Adult A blood transfusion is a procedure in which you receive blood or a type of blood cell (blood component) through an IV. You may need a blood transfusion when your blood level is low. This may result from a bleeding disorder, illness, injury, or surgery. The blood may come from a donor. You may also be able to donate blood for yourself (autologous blood donation) before a planned surgery. The blood given in a transfusion is made up of different blood components. You may receive:  Red blood cells. These carry oxygen to the cells in the body.  Platelets. These help your blood to clot.  Plasma. This is the liquid part of your blood. It carries proteins and other substances throughout the body.  White blood cells. These help you fight infections. If you have hemophilia or another clotting disorder, you may also receive other types of blood products. Tell a health care provider about:  Any blood disorders you have.  Any previous reactions you have had during a blood transfusion.  Any allergies you have.  All medicines you are taking, including vitamins, herbs, eye drops, creams, and over-the-counter medicines.  Any surgeries you have had.  Any medical conditions you have, including any recent fever or cold symptoms.  Whether you are pregnant or may be pregnant. What are the risks? Generally, this is a safe procedure. However, problems may occur.  The most common problems include: ? A mild allergic reaction, such as red, swollen areas of skin (hives) and itching. ? Fever or chills. This may be the body's response to new blood cells received. This may occur during or up to 4  hours after the transfusion.  More serious problems may include: ? Transfusion-associated circulatory overload (TACO), or too much fluid in the lungs. This may cause breathing problems. ? A serious allergic reaction, such as difficulty breathing or swelling around the face and lips. ? Transfusion-related acute lung injury (TRALI), which causes breathing difficulty and low oxygen in the blood. This can occur within hours of the transfusion or several days later. ? Iron overload. This can happen after receiving many blood transfusions over a period of time. ? Infection or virus being transmitted. This is rare because donated blood is carefully tested before it is given. ? Hemolytic transfusion reaction. This is rare. It happens when your body's defense system (immune system)tries to attack the new blood cells. Symptoms may include fever, chills, nausea, low blood pressure, and low back or chest pain. ? Transfusion-associated graft-versus-host disease (TAGVHD). This is rare. It happens when donated cells attack your body's healthy tissues. What happens before the procedure? Medicines Ask your health care provider about:  Changing or stopping your regular medicines. This is especially important if you are taking diabetes medicines or blood thinners.  Taking medicines such as aspirin and ibuprofen. These medicines can thin your blood. Do not take these medicines unless your health care provider tells you to take them.  Taking over-the-counter medicines, vitamins, herbs, and supplements. General instructions  Follow instructions from your health care provider about eating and drinking restrictions.  You will have a blood test to determine your blood type. This is necessary to know what kind of blood your body will accept and to match it to the donor blood.  If you are going to have a planned surgery, you may be able to do an autologous blood donation. This may be done in case you need to have a  transfusion.    You will have your temperature, blood pressure, and pulse monitored before the transfusion.  If you have had an allergic reaction to a transfusion in the past, you may be given medicine to help prevent a reaction. This medicine may be given to you by mouth (orally) or through an IV.  Set aside time for the blood transfusion. This procedure generally takes 1-4 hours to complete. What happens during the procedure?   An IV will be inserted into one of your veins.  The bag of donated blood will be attached to your IV. The blood will then enter through your vein.  Your temperature, blood pressure, and pulse will be monitored regularly during the transfusion. This monitoring is done to detect early signs of a transfusion reaction.  Tell your nurse right away if you have any of these symptoms during the transfusion: ? Shortness of breath or trouble breathing. ? Chest or back pain. ? Fever or chills. ? Hives or itching.  If you have any signs or symptoms of a reaction, your transfusion will be stopped and you may be given medicine.  When the transfusion is complete, your IV will be removed.  Pressure may be applied to the IV site for a few minutes.  A bandage (dressing)will be applied. The procedure may vary among health care providers and hospitals. What happens after the procedure?  Your temperature, blood pressure, pulse, breathing rate, and blood oxygen level will be monitored until you leave the hospital or clinic.  Your blood may be tested to see how you are responding to the transfusion.  You may be warmed with fluids or blankets to maintain a normal body temperature.  If you receive your blood transfusion in an outpatient setting, you will be told whom to contact to report any reactions. Where to find more information For more information on blood transfusions, visit the [employer]: redcross.org Summary  A blood transfusion is a procedure in which you  receive blood or a type of blood cell (blood component) through an IV.  The blood you receive may come from a donor or be donated by yourself (autologous blood donation) before a planned surgery.  The blood given in a transfusion is made up of different blood components. You may receive red blood cells, platelets, plasma, or white blood cells depending on the condition treated.  Your temperature, blood pressure, and pulse will be monitored before, during, and after the transfusion.  After the transfusion, your blood may be tested to see how your body has responded. This information is not intended to replace advice given to you by your health care provider. Make sure you discuss any questions you have with your health care provider. Document Revised: 07/03/2018 Document Reviewed: 07/03/2018 Elsevier Patient Education  [place].    Platelet Transfusion A platelet transfusion is a procedure in which you receive donated platelets through an IV. Platelets are tiny pieces of blood cells. When you get an injury, platelets clump together in the area to form a blood clot. This helps stop bleeding and is the beginning of the healing process. If you have too few platelets, your blood may have trouble clotting. This may cause you to bleed and bruise very easily. You may need a platelet transfusion if you have a condition that causes a low number of platelets (thrombocytopenia). A platelet transfusion may be used to stop or prevent excessive bleeding. Tell a health care provider about:  Any reactions you have had during previous transfusions.  Any allergies you have.  All medicines you are taking, including vitamins, herbs, eye drops, creams, and over-the-counter medicines.  Any blood disorders you have.  Any surgeries you have had.  Any medical conditions you have.  Whether you are pregnant or may be pregnant. What are the risks? Generally, this is a safe procedure. However, problems  may occur, including:  Fever.  Infection.  Allergic reaction to the donor platelets.  Your body's disease-fighting system (immune system) attacking the donor platelets (hemolytic reaction). This is rare.  A rare reaction that causes lung damage (transfusion-related acute lung injury). What happens before the procedure? Medicines  Ask your health care provider about: ? Changing or stopping your regular medicines. This is especially important if you are taking diabetes medicines or blood thinners. ? Taking medicines such as aspirin and ibuprofen. These medicines can thin your blood. Do not take these medicines unless your health care provider tells you to take them. ? Taking over-the-counter medicines, vitamins, herbs, and supplements. General instructions  You will have a blood test to determine your blood type. Your blood type determines what kind of platelets you will be given.  Follow instructions from your health care provider about eating or drinking restrictions.  If you have had an allergic reaction to a transfusion in the past, you may be given medicine to help prevent a reaction.  Your temperature, blood pressure, pulse, and breathing will be monitored. What happens during the procedure?   An IV will be inserted into one of your veins.  For your safety, two health care providers will verify your identity along with the donor platelets about to be infused.  A bag of donor platelets will be connected to your IV. The platelets will flow into your bloodstream. This usually takes 30-60 minutes.  Your temperature, blood pressure, pulse, and breathing will be monitored during the transfusion. This helps detect early signs of any reaction.  You will also be monitored for other symptoms that may indicate a reaction, including chills, hives, or itching.  If you have signs of a reaction at any time, your transfusion will be stopped, and you may be given medicine to help manage  the reaction.  When your transfusion is complete, your IV will be removed.  Pressure may be applied to the IV site for a few minutes to stop any bleeding.  The IV site will be covered with a bandage (dressing). The procedure may vary among health care providers and hospitals. What happens after the procedure?  Your blood pressure, temperature, pulse, and breathing will be monitored until you leave the hospital or clinic.  You may have some bruising and soreness at your IV site. Follow these instructions at home: Medicines  Take over-the-counter and prescription medicines only as told by your health care provider.  Talk with your health care provider before you take any medicines that contain aspirin or NSAIDs. These medicines increase your risk for dangerous bleeding. General instructions  Change or remove your dressing as told by your health care provider.  Return to your normal activities as told by your health care provider. Ask your health care provider what activities are safe for you.  Do not take baths, swim, or use a hot tub until your health care provider approves. Ask your health care provider if you may take showers.  Check your IV site every day for signs of infection. Check for: ? Redness, swelling, or pain. ? Fluid or blood. If fluid or blood drains from your  IV site, use your hands to press down firmly on a bandage covering the area for a minute or two. Doing this should stop the bleeding. ? Warmth. ? Pus or a bad smell.  Keep all follow-up visits as told by your health care provider. This is important. Contact a health care provider if you have:  A headache that does not go away with medicine.  Hives, rash, or itchy skin.  Nausea or vomiting.  Unusual tiredness or weakness.  Signs of infection at your IV site. Get help right away if:  You have a fever or chills.  You urinate less often than usual.  Your urine is darker colored than normal.  You  have any of the following: ? Trouble breathing. ? Pain in your back, abdomen, or chest. ? Cool, clammy skin. ? A fast heartbeat. Summary  Platelets are tiny pieces of blood cells that clump together to form a blood clot when you have an injury. If you have too few platelets, your blood may have trouble clotting.  A platelet transfusion is a procedure in which you receive donated platelets through an IV.  A platelet transfusion may be used to stop or prevent excessive bleeding.  After the procedure, check your IV site every day for signs of infection, including redness, swelling, pain, or warmth. This information is not intended to replace advice given to you by your health care provider. Make sure you discuss any questions you have with your health care provider. Document Revised: 02/13/2017 Document Reviewed: 02/13/2017 Elsevier Patient Education  [place]. Pegfilgrastim injection What is this medicine? PEGFILGRASTIM (PEG fil gra stim) is a long-acting granulocyte colony-stimulating factor that stimulates the growth of neutrophils, a type of white blood cell important in the body's fight against infection. It is used to reduce the incidence of fever and infection in patients with certain types of cancer who are receiving chemotherapy that affects the bone marrow, and to increase survival after being exposed to high doses of radiation. This medicine may be used for other purposes; ask your health care provider or pharmacist if you have questions. COMMON BRAND NAME(S): Steve Rattler, Ziextenzo What should I tell my health care provider before I take this medicine? They need to know if you have any of these conditions:  kidney disease  latex allergy  ongoing radiation therapy  sickle cell disease  skin reactions to acrylic adhesives (On-Body Injector only)  an unusual or allergic reaction to pegfilgrastim, filgrastim, other medicines, foods, dyes, or  preservatives  pregnant or trying to get pregnant  breast-feeding How should I use this medicine? This medicine is for injection under the skin. If you get this medicine at home, you will be taught how to prepare and give the pre-filled syringe or how to use the On-body Injector. Refer to the patient Instructions for Use for detailed instructions. Use exactly as directed. Tell your healthcare provider immediately if you suspect that the On-body Injector may not have performed as intended or if you suspect the use of the On-body Injector resulted in a missed or partial dose. It is important that you put your used needles and syringes in a special sharps container. Do not put them in a trash can. If you do not have a sharps container, call your pharmacist or healthcare provider to get one. Talk to your pediatrician regarding the use of this medicine in children. While this drug may be prescribed for selected conditions, precautions do apply. Overdosage: If you think  you have taken too much of this medicine contact a poison control center or emergency room at once. NOTE: This medicine is only for you. Do not share this medicine with others. What if I miss a dose? It is important not to miss your dose. Call your doctor or health care professional if you miss your dose. If you miss a dose due to an On-body Injector failure or leakage, a new dose should be administered as soon as possible using a single prefilled syringe for manual use. What may interact with this medicine? Interactions have not been studied. Give your health care provider a list of all the medicines, herbs, non-prescription drugs, or dietary supplements you use. Also tell them if you smoke, drink alcohol, or use illegal drugs. Some items may interact with your medicine. This list may not describe all possible interactions. Give your health care provider a list of all the medicines, herbs, non-prescription drugs, or dietary supplements you  use. Also tell them if you smoke, drink alcohol, or use illegal drugs. Some items may interact with your medicine. What should I watch for while using this medicine? You may need blood work done while you are taking this medicine. If you are going to need a MRI, CT scan, or other procedure, tell your doctor that you are using this medicine (On-Body Injector only). What side effects may I notice from receiving this medicine? Side effects that you should report to your doctor or health care professional as soon as possible:  allergic reactions like skin rash, itching or hives, swelling of the face, lips, or tongue  back pain  dizziness  fever  pain, redness, or irritation at site where injected  pinpoint red spots on the skin  red or dark-brown urine  shortness of breath or breathing problems  stomach or side pain, or pain at the shoulder  swelling  tiredness  trouble passing urine or change in the amount of urine Side effects that usually do not require medical attention (report to your doctor or health care professional if they continue or are bothersome):  bone pain  muscle pain This list may not describe all possible side effects. Call your doctor for medical advice about side effects. You may report side effects to FDA at 1-800-FDA-1088. Where should I keep my medicine? Keep out of the reach of children. If you are using this medicine at home, you will be instructed on how to store it. Throw away any unused medicine after the expiration date on the label. NOTE: This sheet is a summary. It may not cover all possible information. If you have questions about this medicine, talk to your doctor, pharmacist, or health care provider.  2020 Elsevier/[employer] (2017-04-15 16:57:08)

## 2019-07-21 NOTE — Telephone Encounter (Signed)
Dr. Marin Olp notified of hgb-6.1 and platelets-15.  Order received for pt to get one unit of blood and one unit of platelets today per Dr. Marin Olp.

## 2019-07-21 NOTE — Progress Notes (Signed)
Hematology and Oncology Follow Up Visit  WANE MOLLETT [id number] 1940/02/28 79 y.o. 07/21/2019   Principle Diagnosis:  IgA kappa myeloma - relapsed post ASCT (+4, +11, 13q- and 17p-)  Past Therapy: Pomalyst stopped 12/25/2016 Daratumumab q 4 week dosing -changed on 10/28/2017 - s/p cycle 35  Carfilzomib -- s/p cycle #1 on 04/07/2019 (3 wk on/1 off)  Current Therapy: Selinexor 100 mg po q day - start 05/25/2019 -- d/c on 06/29/2019 Velcade 1.3 mg/m2 sq q week -- start on 05/03/202 Xgeva 120 m sq q 3 months - next dose 05/2019   Interim History:  Mr. Wert is here today with his wife for follow-up.   Unfortunately, he just is not doing all that well.  I have to believe that a lot of this is still in effect from the [place].  He has been off this now for 3 weeks.  I suspect that this is still causing significant marrow suppression.  I know that he had a hard time with the Selinexor.  We are going to have to stop this.  When we last checked his myeloma studies 3 weeks ago, [name] was mildly elevated at 1 g/dL.  His IgA level was up to 1880 mg/dL.  His kappa light chain was 103 mg/dL.  I know that the options are not all that great for him.  However, it looks like the FDA has approved the new IV drug- Melflufen.  I think this would be a reasonable option for him.  Side effect profile is tolerable.  The success rate is probably 60-70%.  He really wants to have good quality of life.  His quality of life is really is dictated by whether or not he can work in his garden.  He is a Ecologist.  He really has a nice garden.  He always brings in vegetables for the staff.  He gives away vegetables to his church to needy people.  His wife is incredibly dedicated to him.  She wants to make sure that no matter what, he is able to be at home.  At this point, I really do not think we have to get hospice involved.  I do think that there is the 1 option of  treatment with the Melflufen.  His appetite is better according to his wife.  He had a little bit of constipation but this is better also.  I would have to say that his performance status for now is ECOG 2.   Medications:  Allergies as of 07/21/2019   No Known Allergies     Medication List       Accurate as of July 21, 2019  9:17 AM. If you have any questions, ask your nurse or doctor.        acetaminophen 500 MG tablet Commonly known as: TYLENOL Take 1,000 mg by mouth 3 (three) times daily as needed.   aspirin 325 MG tablet Take 325 mg by mouth daily.   atorvastatin 10 MG tablet Commonly known as: LIPITOR Take 1 tablet (10 mg total) by mouth at bedtime.   b complex vitamins tablet Take 1 tablet by mouth daily.   calcium carbonate 1500 (600 Ca) MG Tabs tablet Commonly known as: OSCAL Take 600 mg of elemental calcium by mouth daily with breakfast.   dexamethasone 2 MG tablet Commonly known as: DECADRON TAKE 2 TABLETS BY MOUTH DAY AFTER CHEMO THEN 1 TABLET EVERY DAY FOR 4 DAYS   diazepam 10 MG tablet Commonly  known as: VALIUM Take one tablet PO one hour prior to MRI.   dronabinol 2.5 MG capsule Commonly known as: MARINOL TAKE 1 CAPSULE(2.5 MG) BY MOUTH TWICE DAILY BEFORE A MEAL   famciclovir 250 MG tablet Commonly known as: FAMVIR Take 1 tablet (250 mg total) by mouth daily.   Fish Oil 1200 MG Caps Take 1,200 mg by mouth daily.   methocarbamol 500 MG tablet Commonly known as: ROBAXIN Take 1 tablet (500 mg total) by mouth 3 (three) times daily as needed for muscle spasms.   metoprolol tartrate 50 MG tablet Commonly known as: LOPRESSOR Take 1 tablet (50 mg total) by mouth 2 (two) times daily.   multivitamin tablet Take 1 tablet by mouth daily.   ondansetron 8 MG tablet Commonly known as: ZOFRAN Take 1 tablet (8 mg total) by mouth every 8 (eight) hours as needed for nausea or vomiting.   oxybutynin 10 MG 24 hr tablet Commonly known as:  DITROPAN-XL Take 10 mg by mouth daily.   pantoprazole 40 MG tablet Commonly known as: PROTONIX Take 1 tablet (40 mg total) by mouth at bedtime.   PROBIOTIC PO Take 1 tablet by mouth daily.   selinexor 20 MG Tbpk Take 100 mg by mouth once a week.   tamsulosin 0.4 MG Caps capsule Commonly known as: FLOMAX Take 0.4 mg by mouth daily.   traMADol 50 MG tablet Commonly known as: ULTRAM Take 1 tablet (50 mg total) by mouth every 6 (six) hours as needed.   VITAMIN B-12 PO Take 5,000 mcg by mouth daily.       Allergies: No Known Allergies  Past Medical History, Surgical history, Social history, and Family History were reviewed and updated.  Review of Systems: Review of Systems  Constitutional: Positive for malaise/fatigue.  HENT: Negative.   Eyes: Negative.   Respiratory: Positive for shortness of breath.   Cardiovascular: Negative.   Gastrointestinal: Positive for nausea.  Genitourinary: Negative.   Musculoskeletal: Positive for back pain and joint pain.  Skin: Negative.   Neurological: Positive for weakness.  Endo/Heme/Allergies: Bruises/bleeds easily.  Psychiatric/Behavioral: Negative.      Physical Exam:  vitals were not taken for this visit.   Wt Readings from Last 3 Encounters:  07/21/19 198 lb 4 oz (89.9 kg)  06/30/19 197 lb 1.3 oz (89.4 kg)  06/08/19 206 lb 0.6 oz (93.5 kg)    His vital signs are temperature of 97.8.  Pulse 71.  Blood pressure 123/65.  Weight is 198 pounds.  Physical Exam Vitals reviewed.  HENT:     Head: Normocephalic and atraumatic.  Eyes:     Pupils: Pupils are equal, round, and reactive to light.  Cardiovascular:     Rate and Rhythm: Normal rate and regular rhythm.     Heart sounds: Normal heart sounds.  Pulmonary:     Effort: Pulmonary effort is normal.     Breath sounds: Normal breath sounds.  Abdominal:     General: Bowel sounds are normal.     Palpations: Abdomen is soft.  Musculoskeletal:        General: No tenderness  or deformity. Normal range of motion.     Cervical back: Normal range of motion.  Lymphadenopathy:     Cervical: No cervical adenopathy.  Skin:    General: Skin is warm and dry.     Findings: No erythema or rash.  Neurological:     Mental Status: He is alert and oriented to person, place, and time.  Psychiatric:  Behavior: Behavior normal.        Thought Content: Thought content normal.        Judgment: Judgment normal.      Lab Results  Component Value Date   WBC 1.8 (L) 07/21/2019   HGB 6.1 (LL) 07/21/2019   HCT 18.6 (L) 07/21/2019   MCV 102.2 (H) 07/21/2019   PLT 15 (L) 07/21/2019   Lab Results  Component Value Date   FERRITIN 128 05/06/2019   IRON 99 05/06/2019   TIBC 308 05/06/2019   UIBC 208 05/06/2019   IRONPCTSAT 32 05/06/2019   Lab Results  Component Value Date   RETICCTPCT 2.1 02/25/2019   RBC 1.82 (L) 07/21/2019   Lab Results  Component Value Date   KPAFRELGTCHN 1,026.8 (H) 06/30/2019   LAMBDASER 2.9 (L) 06/30/2019   KAPLAMBRATIO 354.07 (H) 06/30/2019   Lab Results  Component Value Date   IGGSERUM 77 (L) 06/30/2019   IGA 1,879 (H) 06/30/2019   IGMSERUM <5 (L) 06/30/2019   Lab Results  Component Value Date   TOTALPROTELP 7.0 06/30/2019   ALBUMINELP 3.5 06/30/2019   A1GS 0.2 06/30/2019   A2GS 0.9 06/30/2019   BETS 1.9 (H) 06/30/2019   BETA2SER 2.0 (H) 12/08/2014   GAMS 0.4 06/30/2019   MSPIKE 1.0 (H) 06/30/2019   SPEI Comment 06/30/2019     Chemistry      Component Value Date/Time   NA 137 07/21/2019 0836   NA 141 01/21/2017 0741   NA 139 06/14/2016 1246   K 3.9 07/21/2019 0836   K 4.0 01/21/2017 0741   K 3.8 06/14/2016 1246   CL 102 07/21/2019 0836   CL 103 01/21/2017 0741   CO2 25 07/21/2019 0836   CO2 30 01/21/2017 0741   CO2 24 06/14/2016 1246   BUN 15 07/21/2019 0836   BUN 20 01/21/2017 0741   BUN 15.0 06/14/2016 1246   CREATININE 0.85 07/21/2019 0836   CREATININE 1.0 01/21/2017 0741   CREATININE 0.9 06/14/2016 1246       Component Value Date/Time   CALCIUM 8.9 07/21/2019 0836   CALCIUM 9.0 01/21/2017 0741   CALCIUM 9.6 06/14/2016 1246   ALKPHOS 39 07/21/2019 0836   ALKPHOS 37 01/21/2017 0741   ALKPHOS 54 06/14/2016 1246   AST 14 (L) 07/21/2019 0836   AST 25 06/14/2016 1246   ALT 11 07/21/2019 0836   ALT 29 01/21/2017 0741   ALT 21 06/14/2016 1246   BILITOT 0.4 07/21/2019 0836   BILITOT 0.37 06/14/2016 1246       Impression and Plan: Mr. Milstein is a very pleasant 79 yo caucasian gentleman with history of IgA kappa myeloma, high risk cytogenetics with a 17p- abnormality.   Actually, I am surprised that he is done so well despite having the 17p-abnormality.  I do think that we have 1 more chance to try to help him.  I would like to think that using the Melflufen will help.  This works a whole different manner than what he has had before.  I think the side effect profile is tolerable.  Again, he just wants quality of life.  That quality of life will really make his life better.  We will have to transfuse him today with blood and platelets.  His white cell count is low.  I do not want him to get infection.  He has a compromised immune system.  We will give him a dose of Neulasta today.  I think this will help prevent an infection with  him.  We will have to probably get him back in a few weeks.  I spent about 45 minutes with him and his wife today.   [name], MD 6/29/20219:17 AM

## 2019-07-22 ENCOUNTER — Inpatient Hospital Stay: Payer: PPO

## 2019-07-22 ENCOUNTER — Other Ambulatory Visit: Payer: Self-pay | Admitting: Family

## 2019-07-22 DIAGNOSIS — C9000 Multiple myeloma not having achieved remission: Secondary | ICD-10-CM

## 2019-07-22 DIAGNOSIS — C9002 Multiple myeloma in relapse: Secondary | ICD-10-CM | POA: Diagnosis not present

## 2019-07-22 DIAGNOSIS — D649 Anemia, unspecified: Secondary | ICD-10-CM

## 2019-07-22 LAB — PREPARE PLATELET PHERESIS: Unit division: 0

## 2019-07-22 LAB — BPAM PLATELET PHERESIS
Blood Product Expiration Date: 202106302359
ISSUE DATE / TIME: 202106290915
Unit Type and Rh: 6200

## 2019-07-22 MED ORDER — FUROSEMIDE 10 MG/ML IJ SOLN: 20.0000 mg | Freq: Once | INTRAMUSCULAR | Status: DC

## 2019-07-22 MED ORDER — SODIUM CHLORIDE 0.9% FLUSH
10.0000 mL | INTRAVENOUS | Status: DC | PRN
  Administered 2019-07-22: 10 mL via INTRAVENOUS
  Filled 2019-07-22: qty 10

## 2019-07-22 MED ORDER — SODIUM CHLORIDE 0.9% IV SOLUTION
250.0000 mL | Freq: Once | INTRAVENOUS | Status: AC
  Administered 2019-07-22: 250 mL via INTRAVENOUS
  Filled 2019-07-22: qty 250

## 2019-07-22 MED ORDER — HEPARIN SOD (PORK) LOCK FLUSH 100 UNIT/ML IV SOLN
500.0000 [IU] | Freq: Once | INTRAVENOUS | Status: AC
  Administered 2019-07-22: 500 [IU] via INTRAVENOUS
  Filled 2019-07-22: qty 5

## 2019-07-22 NOTE — Patient Instructions (Signed)

## 2019-07-23 ENCOUNTER — Other Ambulatory Visit: Payer: Self-pay | Admitting: Hematology & Oncology

## 2019-07-23 LAB — TYPE AND SCREEN
ABO/RH(D): A POS
Antibody Screen: POSITIVE
Donor AG Type: NEGATIVE
Unit division: 0

## 2019-07-23 LAB — BPAM RBC
Blood Product Expiration Date: 202107202359
ISSUE DATE / TIME: 202106300744
Unit Type and Rh: 5100

## 2019-07-28 ENCOUNTER — Other Ambulatory Visit: Payer: Self-pay | Admitting: *Deleted

## 2019-07-28 ENCOUNTER — Other Ambulatory Visit: Payer: Self-pay | Admitting: Family

## 2019-07-28 ENCOUNTER — Other Ambulatory Visit: Payer: Self-pay

## 2019-07-28 ENCOUNTER — Inpatient Hospital Stay: Payer: PPO | Attending: Hematology & Oncology

## 2019-07-28 ENCOUNTER — Telehealth: Payer: Self-pay | Admitting: *Deleted

## 2019-07-28 ENCOUNTER — Inpatient Hospital Stay: Payer: PPO

## 2019-07-28 DIAGNOSIS — Z7982 Long term (current) use of aspirin: Secondary | ICD-10-CM | POA: Diagnosis not present

## 2019-07-28 DIAGNOSIS — C9000 Multiple myeloma not having achieved remission: Secondary | ICD-10-CM

## 2019-07-28 DIAGNOSIS — D649 Anemia, unspecified: Secondary | ICD-10-CM

## 2019-07-28 DIAGNOSIS — C9002 Multiple myeloma in relapse: Secondary | ICD-10-CM | POA: Insufficient documentation

## 2019-07-28 DIAGNOSIS — Z79899 Other long term (current) drug therapy: Secondary | ICD-10-CM | POA: Insufficient documentation

## 2019-07-28 DIAGNOSIS — Z95828 Presence of other vascular implants and grafts: Secondary | ICD-10-CM

## 2019-07-28 LAB — CBC WITH DIFFERENTIAL (CANCER CENTER ONLY)
Abs Immature Granulocytes: 0.07 10*3/uL (ref 0.00–0.07)
Basophils Absolute: 0 10*3/uL (ref 0.0–0.1)
Basophils Relative: 1 %
Eosinophils Absolute: 0 10*3/uL (ref 0.0–0.5)
Eosinophils Relative: 0 %
HCT: 20.4 % — ABNORMAL LOW (ref 39.0–52.0)
Hemoglobin: 6.7 g/dL — CL (ref 13.0–17.0)
Immature Granulocytes: 2 %
Lymphocytes Relative: 37 %
Lymphs Abs: 1.4 10*3/uL (ref 0.7–4.0)
MCH: 33.2 pg (ref 26.0–34.0)
MCHC: 32.8 g/dL (ref 30.0–36.0)
MCV: 101 fL — ABNORMAL HIGH (ref 80.0–100.0)
Monocytes Absolute: 0.3 10*3/uL (ref 0.1–1.0)
Monocytes Relative: 7 %
Neutro Abs: 2 10*3/uL (ref 1.7–7.7)
Neutrophils Relative %: 53 %
Platelet Count: 15 10*3/uL — ABNORMAL LOW (ref 150–400)
RBC: 2.02 MIL/uL — ABNORMAL LOW (ref 4.22–5.81)
RDW: 16.8 % — ABNORMAL HIGH (ref 11.5–15.5)
WBC Count: 3.8 10*3/uL — ABNORMAL LOW (ref 4.0–10.5)
nRBC: 0 % (ref 0.0–0.2)

## 2019-07-28 LAB — SAMPLE TO BLOOD BANK

## 2019-07-28 LAB — PREPARE RBC (CROSSMATCH)

## 2019-07-28 MED ORDER — SODIUM CHLORIDE 0.9% FLUSH
10.0000 mL | INTRAVENOUS | Status: DC | PRN
  Administered 2019-07-28: 10 mL via INTRAVENOUS
  Filled 2019-07-28: qty 10

## 2019-07-28 MED ORDER — HEPARIN SOD (PORK) LOCK FLUSH 100 UNIT/ML IV SOLN
500.0000 [IU] | Freq: Once | INTRAVENOUS | Status: AC
  Administered 2019-07-28: 500 [IU] via INTRAVENOUS
  Filled 2019-07-28: qty 5

## 2019-07-28 NOTE — Telephone Encounter (Signed)
Richardson Landry from lab brought a panic lab value of Hemoglobin 6.7 to my attention. Labs given to MD.

## 2019-07-28 NOTE — Patient Instructions (Signed)
Implanted Port Insertion, Care After °This sheet gives you information about how to care for yourself after your procedure. Your health care provider may also give you more specific instructions. If you have problems or questions, contact your health care provider. °What can I expect after the procedure? °After the procedure, it is common to have: °· Discomfort at the port insertion site. °· Bruising on the skin over the port. This should improve over 3-4 days. °Follow these instructions at home: °Port care °· After your port is placed, you will get a manufacturer's information card. The card has information about your port. Keep this card with you at all times. °· Take care of the port as told by your health care provider. Ask your health care provider if you or a family member can get training for taking care of the port at home. A home health care nurse may also take care of the port. °· Make sure to remember what type of port you have. °Incision care ° °  ° °· Follow instructions from your health care provider about how to take care of your port insertion site. Make sure you: °? Wash your hands with soap and water before and after you change your bandage (dressing). If soap and water are not available, use hand sanitizer. °? Change your dressing as told by your health care provider. °? Leave stitches (sutures), skin glue, or adhesive strips in place. These skin closures may need to stay in place for 2 weeks or longer. If adhesive strip edges start to loosen and curl up, you may trim the loose edges. Do not remove adhesive strips completely unless your health care provider tells you to do that. °· Check your port insertion site every day for signs of infection. Check for: °? Redness, swelling, or pain. °? Fluid or blood. °? Warmth. °? Pus or a bad smell. °Activity °· Return to your normal activities as told by your health care provider. Ask your health care provider what activities are safe for you. °· Do not  lift anything that is heavier than 10 lb (4.5 kg), or the limit that you are told, until your health care provider says that it is safe. °General instructions °· Take over-the-counter and prescription medicines only as told by your health care provider. °· Do not take baths, swim, or use a hot tub until your health care provider approves. Ask your health care provider if you may take showers. You may only be allowed to take sponge baths. °· Do not drive for 24 hours if you were given a sedative during your procedure. °· Wear a medical alert bracelet in case of an emergency. This will tell any health care providers that you have a port. °· Keep all follow-up visits as told by your health care provider. This is important. °Contact a health care provider if: °· You cannot flush your port with saline as directed, or you cannot draw blood from the port. °· You have a fever or chills. °· You have redness, swelling, or pain around your port insertion site. °· You have fluid or blood coming from your port insertion site. °· Your port insertion site feels warm to the touch. °· You have pus or a bad smell coming from the port insertion site. °Get help right away if: °· You have chest pain or shortness of breath. °· You have bleeding from your port that you cannot control. °Summary °· Take care of the port as told by your health   care provider. Keep the manufacturer's information card with you at all times. °· Change your dressing as told by your health care provider. °· Contact a health care provider if you have a fever or chills or if you have redness, swelling, or pain around your port insertion site. °· Keep all follow-up visits as told by your health care provider. °This information is not intended to replace advice given to you by your health care provider. Make sure you discuss any questions you have with your health care provider. °Document Revised: 08/06/2017 Document Reviewed: 08/06/2017 °Elsevier Patient Education ©  2020 Elsevier Inc. ° °

## 2019-07-28 NOTE — Addendum Note (Signed)
Addended by: Rico Ala on: 07/28/2019 12:46 PM   Modules accepted: Orders, SmartSet

## 2019-07-29 ENCOUNTER — Inpatient Hospital Stay: Payer: PPO

## 2019-07-29 DIAGNOSIS — D649 Anemia, unspecified: Secondary | ICD-10-CM

## 2019-07-29 DIAGNOSIS — C9000 Multiple myeloma not having achieved remission: Secondary | ICD-10-CM

## 2019-07-29 MED ORDER — HEPARIN SOD (PORK) LOCK FLUSH 100 UNIT/ML IV SOLN
500.0000 [IU] | Freq: Once | INTRAVENOUS | Status: AC
  Administered 2019-07-29: 500 [IU] via INTRAVENOUS
  Filled 2019-07-29: qty 5

## 2019-07-29 MED ORDER — SODIUM CHLORIDE 0.9% FLUSH
10.0000 mL | INTRAVENOUS | Status: DC | PRN
  Administered 2019-07-29: 10 mL via INTRAVENOUS
  Filled 2019-07-29: qty 10

## 2019-07-29 MED ORDER — SODIUM CHLORIDE 0.9% IV SOLUTION
250.0000 mL | Freq: Once | INTRAVENOUS | Status: AC
  Administered 2019-07-29: 250 mL via INTRAVENOUS
  Filled 2019-07-29: qty 250

## 2019-07-29 NOTE — Progress Notes (Signed)
07/29/2019 Took Tylenol 650mg  and Benadryl 25mg  today at 0900.

## 2019-07-29 NOTE — Patient Instructions (Signed)

## 2019-07-30 ENCOUNTER — Other Ambulatory Visit: Payer: Self-pay | Admitting: Hematology & Oncology

## 2019-07-30 DIAGNOSIS — C9000 Multiple myeloma not having achieved remission: Secondary | ICD-10-CM

## 2019-07-30 LAB — TYPE AND SCREEN
ABO/RH(D): A POS
Antibody Screen: POSITIVE
Donor AG Type: NEGATIVE
Unit division: 0

## 2019-07-30 LAB — BPAM RBC
Blood Product Expiration Date: 202107132359
ISSUE DATE / TIME: 202107070758
Unit Type and Rh: 600

## 2019-08-04 ENCOUNTER — Other Ambulatory Visit: Payer: Self-pay

## 2019-08-04 ENCOUNTER — Ambulatory Visit (INDEPENDENT_AMBULATORY_CARE_PROVIDER_SITE_OTHER): Payer: PPO | Admitting: Internal Medicine

## 2019-08-04 ENCOUNTER — Inpatient Hospital Stay: Payer: PPO

## 2019-08-04 ENCOUNTER — Other Ambulatory Visit: Payer: Self-pay | Admitting: Family

## 2019-08-04 ENCOUNTER — Encounter: Payer: Self-pay | Admitting: Internal Medicine

## 2019-08-04 VITALS — BP 135/62 | HR 72 | Temp 97.2°F | Resp 18

## 2019-08-04 VITALS — BP 112/70 | HR 72 | Temp 97.7°F | Resp 18 | Ht 65.0 in | Wt 198.0 lb

## 2019-08-04 DIAGNOSIS — E785 Hyperlipidemia, unspecified: Secondary | ICD-10-CM

## 2019-08-04 DIAGNOSIS — C9000 Multiple myeloma not having achieved remission: Secondary | ICD-10-CM | POA: Diagnosis not present

## 2019-08-04 DIAGNOSIS — I10 Essential (primary) hypertension: Secondary | ICD-10-CM

## 2019-08-04 DIAGNOSIS — D649 Anemia, unspecified: Secondary | ICD-10-CM

## 2019-08-04 DIAGNOSIS — Z95828 Presence of other vascular implants and grafts: Secondary | ICD-10-CM

## 2019-08-04 DIAGNOSIS — D696 Thrombocytopenia, unspecified: Secondary | ICD-10-CM

## 2019-08-04 DIAGNOSIS — M546 Pain in thoracic spine: Secondary | ICD-10-CM

## 2019-08-04 LAB — CBC WITH DIFFERENTIAL (CANCER CENTER ONLY)
Abs Immature Granulocytes: 0.05 10*3/uL (ref 0.00–0.07)
Basophils Absolute: 0 10*3/uL (ref 0.0–0.1)
Basophils Relative: 0 %
Eosinophils Absolute: 0 10*3/uL (ref 0.0–0.5)
Eosinophils Relative: 0 %
HCT: 22.5 % — ABNORMAL LOW (ref 39.0–52.0)
Hemoglobin: 7.6 g/dL — ABNORMAL LOW (ref 13.0–17.0)
Immature Granulocytes: 2 %
Lymphocytes Relative: 41 %
Lymphs Abs: 1.2 10*3/uL (ref 0.7–4.0)
MCH: 33.8 pg (ref 26.0–34.0)
MCHC: 33.8 g/dL (ref 30.0–36.0)
MCV: 100 fL (ref 80.0–100.0)
Monocytes Absolute: 0.4 10*3/uL (ref 0.1–1.0)
Monocytes Relative: 13 %
Neutro Abs: 1.3 10*3/uL — ABNORMAL LOW (ref 1.7–7.7)
Neutrophils Relative %: 44 %
Platelet Count: 16 10*3/uL — ABNORMAL LOW (ref 150–400)
RBC: 2.25 MIL/uL — ABNORMAL LOW (ref 4.22–5.81)
RDW: 16 % — ABNORMAL HIGH (ref 11.5–15.5)
WBC Count: 2.9 10*3/uL — ABNORMAL LOW (ref 4.0–10.5)
nRBC: 0 % (ref 0.0–0.2)

## 2019-08-04 LAB — PREPARE RBC (CROSSMATCH)

## 2019-08-04 LAB — SAMPLE TO BLOOD BANK

## 2019-08-04 MED ORDER — SODIUM CHLORIDE 0.9% FLUSH
10.0000 mL | INTRAVENOUS | Status: DC | PRN
  Filled 2019-08-04: qty 10

## 2019-08-04 MED ORDER — HEPARIN SOD (PORK) LOCK FLUSH 100 UNIT/ML IV SOLN
500.0000 [IU] | Freq: Once | INTRAVENOUS | Status: AC
  Administered 2019-08-04: 500 [IU] via INTRAVENOUS
  Filled 2019-08-04: qty 5

## 2019-08-04 MED ORDER — HEPARIN SOD (PORK) LOCK FLUSH 100 UNIT/ML IV SOLN
250.0000 [IU] | INTRAVENOUS | Status: DC | PRN
  Filled 2019-08-04: qty 5

## 2019-08-04 MED ORDER — SODIUM CHLORIDE 0.9% IV SOLUTION
250.0000 mL | Freq: Once | INTRAVENOUS | Status: DC
  Filled 2019-08-04: qty 250

## 2019-08-04 MED ORDER — SODIUM CHLORIDE 0.9% FLUSH
10.0000 mL | INTRAVENOUS | Status: DC | PRN
  Administered 2019-08-04: 10 mL via INTRAVENOUS
  Filled 2019-08-04: qty 10

## 2019-08-04 NOTE — Patient Instructions (Signed)
Thrombocytopenia Thrombocytopenia means that you have a low number of platelets in your blood. Platelets are tiny cells in the blood. When you bleed, they clump together at the cut or injury to stop the bleeding. This is called blood clotting. If you do not have enough platelets, it can cause bleeding problems. Some cases of this condition are mild while others are more severe. What are the causes? This condition may be caused by:  Your body not making enough platelets. This may be caused by: ? Your bone marrow not making blood cells (aplastic anemia). ? Cancer in the bone marrow. ? Certain medicines. ? Infection in the bone marrow. ? Drinking a lot of alcohol.  Your body destroying platelets too quickly. This may be caused by: ? Certain immune diseases. ? Certain medicines. ? Certain blood clotting disorders. ? Certain disorders that are passed from parent to child (inherited). ? Certain bleeding disorders. ? Pregnancy. ? Having a spleen that is larger than normal. What are the signs or symptoms?  Bleeding that is not normal.  Nosebleeds.  Heavy menstrual periods.  Blood in the pee (urine) or poop (stool).  A purple-like color to the skin (purpura).  Bruising.  A rash that looks like pinpoint, purple-red spots (petechiae). How is this treated?  Treatment of another condition that is causing the low platelet count.  Medicines to help protect your platelets from being destroyed.  A replacement (transfusion) of platelets to stop or prevent bleeding.  Surgery to remove the spleen. Follow these instructions at home: Activity  Avoid activities that could cause you to get hurt or bruised. Follow instructions about how to prevent falls.  Take care not to cut yourself: ? When you shave. ? When you use scissors, needles, knives, or other tools.  Take care not to burn yourself: ? When you use an iron. ? When you cook. General instructions   Check your skin and the  inside of your mouth for bruises or blood as told by your doctor.  Check to see if there is blood in your spit (sputum), pee, and poop. Do this as told by your doctor.  Do not drink alcohol.  Take over-the-counter and prescription medicines only as told by your doctor.  Do not take any medicines that have aspirin or NSAIDs in them. These medicines can thin your blood and cause you to bleed.  Tell all of your doctors that you have this condition. Be sure to tell your dentist and eye doctor too. Contact a doctor if:  You have bruises and you do not know why. Get help right away if:  You are bleeding anywhere on your body.  You have blood in your spit, pee, or poop. Summary  Thrombocytopenia means that you have a low number of platelets in your blood.  Platelets are needed for blood clotting.  Symptoms of this condition include bleeding that is not normal, and bruising.  Take care not to cut or burn yourself. This information is not intended to replace advice given to you by your health care provider. Make sure you discuss any questions you have with your health care provider. Document Revised: 10/10/2017 Document Reviewed: 10/10/2017 Elsevier Patient Education  2020 [employer] Inc.  

## 2019-08-04 NOTE — Patient Instructions (Addendum)
    GO TO THE FRONT DESK, PLEASE SCHEDULE YOUR APPOINTMENTS Come back for a check up in 4 months  

## 2019-08-04 NOTE — Assessment & Plan Note (Signed)
Multiple myeloma, not on remission: Overall declining, saw oncology 07/21/2019, hemoglobin 6.7 on 07/28/2019, under the care of hematology, doing transfusions, has blood work scheduled for today. HTN: BP today satisfactory on metoprolol. High cholesterol: Not taking Lipitor, "too many things going on, I am having aches and pains".  At this point QOL is the most important thing to him, if he stop Lipitor and feels somewhat better I have no problem with that. End of life: The patient overall is declining, he has palliative care on board.  QOL is his main concern.  He enjoys going to his garden and I encouraged him to do that in safe way to prevent falls. Back pain: As described above, no  clinical evidence of a neurological deficit, bladder incontinence is chronic RTC 4 months

## 2019-08-04 NOTE — Addendum Note (Signed)
Addended by: Rico Ala on: 08/04/2019 10:09 AM   Modules accepted: Orders, SmartSet

## 2019-08-04 NOTE — Progress Notes (Signed)
Subjective:    Patient ID: Gene Hill, male    DOB: 08-01-1940, 79 y.o.   MRN: [id number]  DOS:  08/04/2019 Type of visit - description: Routine office visit Here with his wife. Oncology note reviews. In general he feels extremely weak, hemoglobin was 6.7 approximately a week ago.  He is under the care of hematology. Report bilateral leg weakness back pain. The back pain is helped by Tylenol daily. Denies any saddle anesthesia or focal motor deficit of the lower extremities. He has chronic bladder incontinence but no bowel incontinence. Denies chest pain or lower extremity edema   Review of Systems See above   Past Medical History:  Diagnosis Date  . Arthritis   . 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 Blood dyscrasia    low WBC d/t multiple myeloma  . Bulging lumbar disc    L2-3  . Carotid bruit    3-11 carotid u/s was  (-)  . 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 GERD (gastroesophageal reflux disease)   . Headache   . Heart murmur    doesn't have one  . History of hiatal hernia   . HYPERLIPIDEMIA 05/03/2006  . HYPERTENSION 05/03/2006  . 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 Torn rotator cuff 01/22/2017   left- [place]    Past Surgical History:  Procedure Laterality Date  . BACK SURGERY    . CERVICAL FUSION    . IR FLUORO GUIDE PORT INSERTION RIGHT  11/29/2016  . IR US GUIDE VASC ACCESS RIGHT  11/29/2016  . [place]   right  . REVERSE SHOULDER ARTHROPLASTY Right 06/25/2018   Procedure: REVERSE SHOULDER ARTHROPLASTY;  Surgeon: Hiram Gash, MD;  Location: [facility];  Service: Orthopedics;  Laterality: Right;  . REVERSE SHOULDER ARTHROPLASTY Left 11/19/2018   Procedure: REVERSE SHOULDER ARTHROPLASTY;  Surgeon: Hiram Gash, MD;  Location: [facility];  Service: Orthopedics;  Laterality: Left;  . ROTATOR CUFF REPAIR     right  . SPINAL FUSION     lumbar  . TOTAL KNEE ARTHROPLASTY Right 12/23/2015   Procedure: TOTAL KNEE ARTHROPLASTY;  Surgeon: Earlie Server, MD;  Location: [facility];  Service: Orthopedics;  Laterality: Right;  . TOTAL KNEE REVISION Right 02/18/2019  . TOTAL KNEE REVISION Right 02/18/2019   Procedure: TOTAL KNEE REVISION;  Surgeon: Earlie Server, MD;  Location: [facility];  Service: Orthopedics;  Laterality: Right;    Allergies as of 08/04/2019   No Known Allergies     Medication List       Accurate as of August 04, 2019  9:53 PM. If you have any questions, ask your nurse or doctor.        acetaminophen 500 MG tablet Commonly known as: TYLENOL Take 1,000 mg by mouth 3 (three) times daily as needed.   aspirin 325 MG tablet Take 325 mg by mouth daily.   atorvastatin 10 MG tablet Commonly known as: LIPITOR Take 1 tablet (10 mg total) by mouth at bedtime.   b complex vitamins tablet Take 1 tablet by mouth daily.   calcium carbonate 1500 (600 Ca) MG Tabs tablet Commonly known as: OSCAL Take 600 mg of elemental calcium by mouth daily with breakfast.   dexamethasone  2 MG tablet Commonly known as: DECADRON TAKE 2 TABLETS BY MOUTH DAY AFTER CHEMO THEN 1 TABLET EVERY DAY FOR 4 DAYS   diazepam 10 MG tablet Commonly known as: VALIUM Take one tablet PO one hour prior to MRI.   dronabinol 2.5 MG capsule Commonly known as: MARINOL TAKE 1 CAPSULE(2.5 MG) BY MOUTH TWICE DAILY BEFORE A MEAL   famciclovir 250 MG tablet Commonly known as: FAMVIR Take 1 tablet (250 mg total) by mouth daily.   Fish Oil 1200 MG Caps Take 1,200 mg by mouth daily.   methocarbamol 500 MG tablet Commonly known as: ROBAXIN Take 1 tablet (500 mg total) by mouth 3 (three) times daily as needed for muscle spasms.   metoprolol tartrate 50 MG tablet Commonly known as: LOPRESSOR Take 1 tablet (50 mg total) by mouth 2 (two) times daily.     multivitamin tablet Take 1 tablet by mouth daily.   ondansetron 8 MG tablet Commonly known as: ZOFRAN Take 1 tablet (8 mg total) by mouth every 8 (eight) hours as needed for nausea or vomiting.   oxybutynin 10 MG 24 hr tablet Commonly known as: DITROPAN-XL Take 10 mg by mouth daily.   pantoprazole 40 MG tablet Commonly known as: PROTONIX Take 1 tablet (40 mg total) by mouth at bedtime.   PROBIOTIC PO Take 1 tablet by mouth daily.   selinexor 20 MG Tbpk Take 100 mg by mouth once a week.   tamsulosin 0.4 MG Caps capsule Commonly known as: FLOMAX Take 0.4 mg by mouth daily.   traMADol 50 MG tablet Commonly known as: ULTRAM Take 1 tablet (50 mg total) by mouth every 6 (six) hours as needed.   VITAMIN B-12 PO Take 5,000 mcg by mouth daily.          Objective:   Physical Exam BP 112/70 (BP Location: Right Arm, Patient Position: Sitting, Cuff Size: Normal)   Pulse 72   Temp 97.7 F (36.5 C) (Oral)   Resp 18   Ht 5' 5"  (1.651 m)   Wt 198 lb (89.8 kg)   SpO2 95%   BMI 32.95 kg/m  General:   Well developed, chronically ill noted but in no distress HEENT:  Normocephalic . Face symmetric, atraumatic Lungs:  CTA B Normal respiratory effort, no intercostal retractions, no accessory muscle use. Heart: RRR,  no murmur.  Lower extremities: no pretibial edema bilaterally  Skin: Not pale. Not jaundice Neurologic:  alert & oriented X3.  Speech normal, gait not tested. Motor exam lower extremities: Symmetric. Psych--  Cognition and judgment appear intact.  Cooperative with normal attention span and concentration.  Behavior appropriate. No anxious or depressed appearing.      Assessment     Assessment Pre-diabetes HTN Hyperlipidemia (self dc  simva 11-2014)  CAD by cath 1998, medical management, Cardiolite ~ 2007 ? Multiple myeloma -- stem cell transplant 2011, with recurrence as off 01-2015  GI: --Barrett' esophagus EGD 2010, 2013 --PUD 2010  [facility] --  generalized aches, pains, chronic (no better after dc simvastatin ~ 11-2014) -- DJ[facility] Dr Percell Miller Hypogonadism Carotid bruit, [place] (-) 2011  PLAN Multiple myeloma, not on remission: Overall declining, saw oncology 07/21/2019, hemoglobin 6.7 on 07/28/2019, under the care of hematology, doing transfusions, has blood work scheduled for today. HTN: BP today satisfactory on metoprolol. High cholesterol: Not taking Lipitor, "too many things going on, I am having aches and pains".  At this point QOL is the most important thing to him, if he stop Lipitor and  feels somewhat better I have no problem with that. End of life: The patient overall is declining, he has palliative care on board.  QOL is his main concern.  He enjoys going to his garden and I encouraged him to do that in safe way to prevent falls. Back pain: As described above, no  clinical evidence of a neurological deficit, bladder incontinence is chronic RTC 4 months   This visit occurred during the SARS-CoV-2 public health emergency.  Safety protocols were in place, including screening questions prior to the visit, additional usage of staff PPE, and extensive cleaning of exam room while observing appropriate contact time as indicated for disinfecting solutions.

## 2019-08-04 NOTE — Progress Notes (Signed)
Pre visit review using our clinic review tool, if applicable. No additional management support is needed unless otherwise documented below in the visit note. 

## 2019-08-04 NOTE — Patient Instructions (Signed)
Implanted Port Insertion, Care After °This sheet gives you information about how to care for yourself after your procedure. Your health care provider may also give you more specific instructions. If you have problems or questions, contact your health care provider. °What can I expect after the procedure? °After the procedure, it is common to have: °· Discomfort at the port insertion site. °· Bruising on the skin over the port. This should improve over 3-4 days. °Follow these instructions at home: °Port care °· After your port is placed, you will get a manufacturer's information card. The card has information about your port. Keep this card with you at all times. °· Take care of the port as told by your health care provider. Ask your health care provider if you or a family member can get training for taking care of the port at home. A home health care nurse may also take care of the port. °· Make sure to remember what type of port you have. °Incision care ° °  ° °· Follow instructions from your health care provider about how to take care of your port insertion site. Make sure you: °? Wash your hands with soap and water before and after you change your bandage (dressing). If soap and water are not available, use hand sanitizer. °? Change your dressing as told by your health care provider. °? Leave stitches (sutures), skin glue, or adhesive strips in place. These skin closures may need to stay in place for 2 weeks or longer. If adhesive strip edges start to loosen and curl up, you may trim the loose edges. Do not remove adhesive strips completely unless your health care provider tells you to do that. °· Check your port insertion site every day for signs of infection. Check for: °? Redness, swelling, or pain. °? Fluid or blood. °? Warmth. °? Pus or a bad smell. °Activity °· Return to your normal activities as told by your health care provider. Ask your health care provider what activities are safe for you. °· Do not  lift anything that is heavier than 10 lb (4.5 kg), or the limit that you are told, until your health care provider says that it is safe. °General instructions °· Take over-the-counter and prescription medicines only as told by your health care provider. °· Do not take baths, swim, or use a hot tub until your health care provider approves. Ask your health care provider if you may take showers. You may only be allowed to take sponge baths. °· Do not drive for 24 hours if you were given a sedative during your procedure. °· Wear a medical alert bracelet in case of an emergency. This will tell any health care providers that you have a port. °· Keep all follow-up visits as told by your health care provider. This is important. °Contact a health care provider if: °· You cannot flush your port with saline as directed, or you cannot draw blood from the port. °· You have a fever or chills. °· You have redness, swelling, or pain around your port insertion site. °· You have fluid or blood coming from your port insertion site. °· Your port insertion site feels warm to the touch. °· You have pus or a bad smell coming from the port insertion site. °Get help right away if: °· You have chest pain or shortness of breath. °· You have bleeding from your port that you cannot control. °Summary °· Take care of the port as told by your health   care provider. Keep the manufacturer's information card with you at all times. °· Change your dressing as told by your health care provider. °· Contact a health care provider if you have a fever or chills or if you have redness, swelling, or pain around your port insertion site. °· Keep all follow-up visits as told by your health care provider. °This information is not intended to replace advice given to you by your health care provider. Make sure you discuss any questions you have with your health care provider. °Document Revised: 08/06/2017 Document Reviewed: 08/06/2017 °Elsevier Patient Education ©  2020 Elsevier Inc. ° °

## 2019-08-05 ENCOUNTER — Inpatient Hospital Stay: Payer: PPO

## 2019-08-05 ENCOUNTER — Other Ambulatory Visit: Payer: Self-pay | Admitting: Family

## 2019-08-05 DIAGNOSIS — C9000 Multiple myeloma not having achieved remission: Secondary | ICD-10-CM

## 2019-08-05 DIAGNOSIS — D649 Anemia, unspecified: Secondary | ICD-10-CM

## 2019-08-05 LAB — PREPARE PLATELET PHERESIS: Unit division: 0

## 2019-08-05 LAB — BPAM PLATELET PHERESIS
Blood Product Expiration Date: 202107152359
ISSUE DATE / TIME: 202107131052
Unit Type and Rh: 600

## 2019-08-05 MED ORDER — SODIUM CHLORIDE 0.9% IV SOLUTION
250.0000 mL | Freq: Once | INTRAVENOUS | Status: AC
  Administered 2019-08-05: 250 mL via INTRAVENOUS
  Filled 2019-08-05: qty 250

## 2019-08-05 MED ORDER — FUROSEMIDE 10 MG/ML IJ SOLN: 20.0000 mg | Freq: Once | INTRAMUSCULAR | Status: DC

## 2019-08-05 MED ORDER — DRONABINOL 2.5 MG PO CAPS: ORAL_CAPSULE | ORAL | 0 refills | Status: DC

## 2019-08-05 MED ORDER — HEPARIN SOD (PORK) LOCK FLUSH 100 UNIT/ML IV SOLN
250.0000 [IU] | INTRAVENOUS | Status: AC | PRN
  Administered 2019-08-05: 250 [IU]
  Filled 2019-08-05: qty 5

## 2019-08-05 MED ORDER — DIPHENHYDRAMINE HCL 25 MG PO CAPS: 25.0000 mg | ORAL_CAPSULE | Freq: Once | ORAL | Status: DC

## 2019-08-05 MED ORDER — SODIUM CHLORIDE 0.9% FLUSH
10.0000 mL | INTRAVENOUS | Status: AC | PRN
  Administered 2019-08-05: 10 mL
  Filled 2019-08-05: qty 10

## 2019-08-05 MED ORDER — SODIUM CHLORIDE 0.9% IV SOLUTION
250.0000 mL | Freq: Once | INTRAVENOUS | Status: DC
  Filled 2019-08-05: qty 250

## 2019-08-05 MED ORDER — ACETAMINOPHEN 325 MG PO TABS: 650.0000 mg | ORAL_TABLET | Freq: Once | ORAL | Status: DC

## 2019-08-05 NOTE — Patient Instructions (Signed)

## 2019-08-06 LAB — BPAM RBC
Blood Product Expiration Date: 202107282359
ISSUE DATE / TIME: 202107140748
Unit Type and Rh: 6200

## 2019-08-06 LAB — TYPE AND SCREEN
ABO/RH(D): A POS
Antibody Screen: NEGATIVE
Unit division: 0

## 2019-08-10 ENCOUNTER — Other Ambulatory Visit: Payer: Self-pay | Admitting: Hematology & Oncology

## 2019-08-10 DIAGNOSIS — C9000 Multiple myeloma not having achieved remission: Secondary | ICD-10-CM

## 2019-08-10 NOTE — Progress Notes (Unsigned)
Pharmacist Chemotherapy Monitoring - Initial Assessment    Anticipated start date: 08/17/2019  Regimen:  . Are orders appropriate based on the patient's diagnosis, regimen, and cycle? Yes . Does the plan date match the patient's scheduled date? Yes . Is the sequencing of drugs appropriate? Yes . Are the premedications appropriate for the patient's regimen? Yes . Prior Authorization for treatment is: Not Started o If applicable, is the correct biosimilar selected based on the patient's insurance? not applicable  Organ Function and Labs: Marland Kitchen Are dose adjustments needed based on the patient's renal function, hepatic function, or hematologic function? No . Are appropriate labs ordered prior to the start of patient's treatment? Yes . Other organ system assessment, if indicated: N/A . The following baseline labs, if indicated, have been ordered: N/A  Dose Assessment: . Are the drug doses appropriate? Yes . Are the following correct: o Drug concentrations Yes o IV fluid compatible with drug Yes o Administration routes Yes o Timing of therapy Yes . If applicable, does the patient have documented access for treatment and/or plans for port-a-cath placement? yes . If applicable, have lifetime cumulative doses been properly documented and assessed? not applicable Lifetime Dose Tracking  No doses have been documented on this patient for the following tracked chemicals: Doxorubicin, Epirubicin, Idarubicin, Daunorubicin, Mitoxantrone, Bleomycin, Oxaliplatin, Carboplatin, Liposomal Doxorubicin  o   Toxicity Monitoring/Prevention: . The patient has the following take home antiemetics prescribed: Ondansetron . The patient has the following take home medications prescribed: N/A . Medication allergies and previous infusion related reactions, if applicable, have been reviewed and addressed. No . The patient's current medication list has been assessed for drug-drug interactions with their chemotherapy  regimen. no significant drug-drug interactions were identified on review.  Order Review: . Are the treatment plan orders signed? Yes . Is the patient scheduled to see a provider prior to their treatment? Yes  I verify that I have reviewed each item in the above checklist and answered each question accordingly.  Sommer Spickard, [name] 08/10/2019 1:40 PM

## 2019-08-11 ENCOUNTER — Other Ambulatory Visit: Payer: Self-pay | Admitting: Family

## 2019-08-11 ENCOUNTER — Other Ambulatory Visit: Payer: Self-pay

## 2019-08-11 ENCOUNTER — Inpatient Hospital Stay: Payer: PPO

## 2019-08-11 ENCOUNTER — Ambulatory Visit: Payer: PPO | Admitting: Family

## 2019-08-11 ENCOUNTER — Telehealth: Payer: Self-pay | Admitting: *Deleted

## 2019-08-11 VITALS — BP 140/71 | HR 72 | Temp 98.6°F | Resp 18 | Wt 200.2 lb

## 2019-08-11 DIAGNOSIS — C9000 Multiple myeloma not having achieved remission: Secondary | ICD-10-CM

## 2019-08-11 DIAGNOSIS — D649 Anemia, unspecified: Secondary | ICD-10-CM | POA: Diagnosis not present

## 2019-08-11 DIAGNOSIS — Z95828 Presence of other vascular implants and grafts: Secondary | ICD-10-CM

## 2019-08-11 LAB — CBC WITH DIFFERENTIAL (CANCER CENTER ONLY)
Abs Immature Granulocytes: 0.11 10*3/uL — ABNORMAL HIGH (ref 0.00–0.07)
Basophils Absolute: 0 10*3/uL (ref 0.0–0.1)
Basophils Relative: 0 %
Eosinophils Absolute: 0 10*3/uL (ref 0.0–0.5)
Eosinophils Relative: 0 %
HCT: 22.3 % — ABNORMAL LOW (ref 39.0–52.0)
Hemoglobin: 7.3 g/dL — ABNORMAL LOW (ref 13.0–17.0)
Immature Granulocytes: 4 %
Lymphocytes Relative: 55 %
Lymphs Abs: 1.4 10*3/uL (ref 0.7–4.0)
MCH: 31.9 pg (ref 26.0–34.0)
MCHC: 32.7 g/dL (ref 30.0–36.0)
MCV: 97.4 fL (ref 80.0–100.0)
Monocytes Absolute: 0.5 10*3/uL (ref 0.1–1.0)
Monocytes Relative: 21 %
Neutro Abs: 0.5 10*3/uL — ABNORMAL LOW (ref 1.7–7.7)
Neutrophils Relative %: 20 %
Platelet Count: 18 10*3/uL — ABNORMAL LOW (ref 150–400)
RBC: 2.29 MIL/uL — ABNORMAL LOW (ref 4.22–5.81)
RDW: 16.4 % — ABNORMAL HIGH (ref 11.5–15.5)
WBC Count: 2.6 10*3/uL — ABNORMAL LOW (ref 4.0–10.5)
nRBC: 0 % (ref 0.0–0.2)

## 2019-08-11 LAB — SAMPLE TO BLOOD BANK

## 2019-08-11 MED ORDER — HEPARIN SOD (PORK) LOCK FLUSH 100 UNIT/ML IV SOLN
500.0000 [IU] | Freq: Once | INTRAVENOUS | Status: AC
  Administered 2019-08-11: 500 [IU] via INTRAVENOUS
  Filled 2019-08-11: qty 5

## 2019-08-11 MED ORDER — SODIUM CHLORIDE 0.9% FLUSH
10.0000 mL | Freq: Once | INTRAVENOUS | Status: AC
  Administered 2019-08-11: 10 mL via INTRAVENOUS
  Filled 2019-08-11: qty 10

## 2019-08-11 MED ORDER — MIRTAZAPINE 7.5 MG PO TABS: 7.5000 mg | ORAL_TABLET | Freq: Every day | ORAL | 3 refills | Status: DC

## 2019-08-11 NOTE — Telephone Encounter (Signed)
Dr. Marin Olp notified of hgb-7.3 and platelet count-18.  No new orders received at this time.

## 2019-08-11 NOTE — Addendum Note (Signed)
Addended by: Tyler Aas A on: 08/11/2019 11:14 AM   Modules accepted: Orders

## 2019-08-11 NOTE — Patient Instructions (Signed)
Implanted Port Insertion, Care After °This sheet gives you information about how to care for yourself after your procedure. Your health care provider may also give you more specific instructions. If you have problems or questions, contact your health care provider. °What can I expect after the procedure? °After the procedure, it is common to have: °· Discomfort at the port insertion site. °· Bruising on the skin over the port. This should improve over 3-4 days. °Follow these instructions at home: °Port care °· After your port is placed, you will get a manufacturer's information card. The card has information about your port. Keep this card with you at all times. °· Take care of the port as told by your health care provider. Ask your health care provider if you or a family member can get training for taking care of the port at home. A home health care nurse may also take care of the port. °· Make sure to remember what type of port you have. °Incision care ° °  ° °· Follow instructions from your health care provider about how to take care of your port insertion site. Make sure you: °? Wash your hands with soap and water before and after you change your bandage (dressing). If soap and water are not available, use hand sanitizer. °? Change your dressing as told by your health care provider. °? Leave stitches (sutures), skin glue, or adhesive strips in place. These skin closures may need to stay in place for 2 weeks or longer. If adhesive strip edges start to loosen and curl up, you may trim the loose edges. Do not remove adhesive strips completely unless your health care provider tells you to do that. °· Check your port insertion site every day for signs of infection. Check for: °? Redness, swelling, or pain. °? Fluid or blood. °? Warmth. °? Pus or a bad smell. °Activity °· Return to your normal activities as told by your health care provider. Ask your health care provider what activities are safe for you. °· Do not  lift anything that is heavier than 10 lb (4.5 kg), or the limit that you are told, until your health care provider says that it is safe. °General instructions °· Take over-the-counter and prescription medicines only as told by your health care provider. °· Do not take baths, swim, or use a hot tub until your health care provider approves. Ask your health care provider if you may take showers. You may only be allowed to take sponge baths. °· Do not drive for 24 hours if you were given a sedative during your procedure. °· Wear a medical alert bracelet in case of an emergency. This will tell any health care providers that you have a port. °· Keep all follow-up visits as told by your health care provider. This is important. °Contact a health care provider if: °· You cannot flush your port with saline as directed, or you cannot draw blood from the port. °· You have a fever or chills. °· You have redness, swelling, or pain around your port insertion site. °· You have fluid or blood coming from your port insertion site. °· Your port insertion site feels warm to the touch. °· You have pus or a bad smell coming from the port insertion site. °Get help right away if: °· You have chest pain or shortness of breath. °· You have bleeding from your port that you cannot control. °Summary °· Take care of the port as told by your health   care provider. Keep the manufacturer's information card with you at all times. °· Change your dressing as told by your health care provider. °· Contact a health care provider if you have a fever or chills or if you have redness, swelling, or pain around your port insertion site. °· Keep all follow-up visits as told by your health care provider. °This information is not intended to replace advice given to you by your health care provider. Make sure you discuss any questions you have with your health care provider. °Document Revised: 08/06/2017 Document Reviewed: 08/06/2017 °Elsevier Patient Education ©  2020 Elsevier Inc. ° °

## 2019-08-11 NOTE — Progress Notes (Signed)
Port [place] accessed and patient dicharged home, no transfusion needed today per Dr.Ennever

## 2019-08-11 NOTE — Progress Notes (Signed)
He requested a refill on the Remeron which he states he has been taking long term at 1/2 tablet (7.5 mg) PO daily. Script refilled.

## 2019-08-13 ENCOUNTER — Other Ambulatory Visit: Payer: Self-pay | Admitting: Hematology & Oncology

## 2019-08-17 ENCOUNTER — Inpatient Hospital Stay: Payer: PPO

## 2019-08-17 ENCOUNTER — Inpatient Hospital Stay (HOSPITAL_BASED_OUTPATIENT_CLINIC_OR_DEPARTMENT_OTHER): Payer: PPO | Admitting: Family

## 2019-08-17 ENCOUNTER — Other Ambulatory Visit: Payer: Self-pay

## 2019-08-17 ENCOUNTER — Other Ambulatory Visit: Payer: Self-pay | Admitting: *Deleted

## 2019-08-17 ENCOUNTER — Telehealth: Payer: Self-pay | Admitting: Family

## 2019-08-17 ENCOUNTER — Encounter: Payer: Self-pay | Admitting: Family

## 2019-08-17 ENCOUNTER — Other Ambulatory Visit: Payer: Self-pay | Admitting: Hematology & Oncology

## 2019-08-17 ENCOUNTER — Telehealth: Payer: Self-pay | Admitting: *Deleted

## 2019-08-17 ENCOUNTER — Other Ambulatory Visit: Payer: Self-pay | Admitting: Family

## 2019-08-17 VITALS — BP 140/62 | HR 68 | Temp 98.6°F | Resp 18 | Ht 65.0 in | Wt 197.4 lb

## 2019-08-17 DIAGNOSIS — D649 Anemia, unspecified: Secondary | ICD-10-CM

## 2019-08-17 DIAGNOSIS — Z95828 Presence of other vascular implants and grafts: Secondary | ICD-10-CM

## 2019-08-17 DIAGNOSIS — C9000 Multiple myeloma not having achieved remission: Secondary | ICD-10-CM | POA: Diagnosis not present

## 2019-08-17 DIAGNOSIS — D696 Thrombocytopenia, unspecified: Secondary | ICD-10-CM

## 2019-08-17 LAB — SAMPLE TO BLOOD BANK

## 2019-08-17 LAB — CMP (CANCER CENTER ONLY)
ALT: 12 U/L (ref 0–44)
AST: 19 U/L (ref 15–41)
Albumin: 3.9 g/dL (ref 3.5–5.0)
Alkaline Phosphatase: 35 U/L — ABNORMAL LOW (ref 38–126)
Anion gap: 10 (ref 5–15)
BUN: 16 mg/dL (ref 8–23)
CO2: 26 mmol/L (ref 22–32)
Calcium: 9.8 mg/dL (ref 8.9–10.3)
Chloride: 101 mmol/L (ref 98–111)
Creatinine: 0.99 mg/dL (ref 0.61–1.24)
GFR, Est AFR Am: >60 mL/min (ref >60)
GFR, Estimated: >60 mL/min (ref >60)
Glucose, Bld: 104 mg/dL — ABNORMAL HIGH (ref 70–99)
Potassium: 4 mmol/L (ref 3.5–5.1)
Sodium: 137 mmol/L (ref 135–145)
Total Bilirubin: 0.4 mg/dL (ref 0.3–1.2)
Total Protein: 7.6 g/dL (ref 6.5–8.1)

## 2019-08-17 LAB — CBC WITH DIFFERENTIAL (CANCER CENTER ONLY)
Abs Immature Granulocytes: 0.07 10*3/uL (ref 0.00–0.07)
Basophils Absolute: 0 10*3/uL (ref 0.0–0.1)
Basophils Relative: 0 %
Eosinophils Absolute: 0 10*3/uL (ref 0.0–0.5)
Eosinophils Relative: 0 %
HCT: 20.1 % — ABNORMAL LOW (ref 39.0–52.0)
Hemoglobin: 6.7 g/dL — CL (ref 13.0–17.0)
Immature Granulocytes: 3 %
Lymphocytes Relative: 52 %
Lymphs Abs: 1.4 10*3/uL (ref 0.7–4.0)
MCH: 32.5 pg (ref 26.0–34.0)
MCHC: 33.3 g/dL (ref 30.0–36.0)
MCV: 97.6 fL (ref 80.0–100.0)
Monocytes Absolute: 0.5 10*3/uL (ref 0.1–1.0)
Monocytes Relative: 21 %
Neutro Abs: 0.6 10*3/uL — ABNORMAL LOW (ref 1.7–7.7)
Neutrophils Relative %: 24 %
Platelet Count: 21 10*3/uL — ABNORMAL LOW (ref 150–400)
RBC: 2.06 MIL/uL — ABNORMAL LOW (ref 4.22–5.81)
RDW: 16.2 % — ABNORMAL HIGH (ref 11.5–15.5)
WBC Count: 2.6 10*3/uL — ABNORMAL LOW (ref 4.0–10.5)
nRBC: 0.8 % — ABNORMAL HIGH (ref 0.0–0.2)

## 2019-08-17 LAB — PREPARE RBC (CROSSMATCH)

## 2019-08-17 LAB — LACTATE DEHYDROGENASE: LDH: 294 U/L — ABNORMAL HIGH (ref 98–192)

## 2019-08-17 MED ORDER — SODIUM CHLORIDE 0.9% FLUSH
10.0000 mL | Freq: Once | INTRAVENOUS | Status: AC
  Administered 2019-08-17: 10 mL via INTRAVENOUS
  Filled 2019-08-17: qty 10

## 2019-08-17 MED ORDER — HEPARIN SOD (PORK) LOCK FLUSH 100 UNIT/ML IV SOLN
500.0000 [IU] | Freq: Once | INTRAVENOUS | Status: AC
  Administered 2019-08-17: 500 [IU] via INTRAVENOUS
  Filled 2019-08-17: qty 5

## 2019-08-17 NOTE — Addendum Note (Signed)
Addended by: Shelda Altes on: 08/17/2019 11:25 AM   Modules accepted: Orders

## 2019-08-17 NOTE — Telephone Encounter (Signed)
Chauncey Cruel [place] NP notified of hgb-6.7, wbc-2.6, platelets-21 and anc-0.6.  Order received for patient to get two units of PRBC's tomorrow per S. [place] NP.

## 2019-08-17 NOTE — Telephone Encounter (Signed)
Appointments scheduled patient is aware of times per 7/26 los

## 2019-08-17 NOTE — Patient Instructions (Signed)

## 2019-08-17 NOTE — Progress Notes (Signed)
Hematology and Oncology Follow Up Visit  Gene Hill [id number] 13-Jan-1941 79 y.o. 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 He is feeling fatigued today and SOB with or without exertion at times.  He is ambulating with a cane for added support and has not had any falls or syncopal episodes.  He has not noted any episodes of bleeding. No abnormal bleeding. No petechiae.  Hgb is 6.7, WBC count 2.6 and platelets 21.  M-spike last month was 1.0, IgA level 1,879 mg/dL and kappa light chains 1,026 mg/L.  No fever, chills, cough, rash, dizziness, chest pain, palpitations, abdominal pain or changes in bowel or bladder habits.  He has occasional episodes or nausea and takes a Zofran when needed which is effective.  No swelling or tenderness in his extremities.  He does have intermittent numbness and tingling in the left neck, shoulder, right arm and occasionally the feet. He feels that his appetite is better and he is hydrating properly. His weight is up 2 lbs since his last visit.   ECOG Performance Status: 1 - Symptomatic but completely ambulatory  Medications:  Allergies as of 08/17/2019   No Known Allergies     Medication List       Accurate as of August 17, 2019 10:42 AM. If you have any questions, ask your nurse or doctor.        STOP taking these medications   atorvastatin 10 MG tablet Commonly known as: LIPITOR Stopped by: [name], NP   dexamethasone 2 MG  tablet Commonly known as: DECADRON Stopped by: [name], NP   diazepam 10 MG tablet Commonly known as: VALIUM Stopped by: [name], NP   selinexor 20 MG Tbpk Stopped by: [name], NP     TAKE these medications   acetaminophen 500 MG tablet Commonly known as: TYLENOL Take 1,000 mg by mouth 3 (three) times daily as needed.   aspirin 325 MG tablet Take 325 mg by mouth daily.   b complex vitamins tablet Take 1 tablet by mouth daily.   calcium carbonate 1500 (600 Ca) MG Tabs tablet Commonly known as: OSCAL Take 600 mg of elemental calcium by mouth daily with breakfast.   dronabinol 2.5 MG capsule Commonly known as: MARINOL TAKE 1 CAPSULE(2.5 MG) BY MOUTH TWICE DAILY BEFORE A MEAL   famciclovir 250 MG tablet Commonly known as: FAMVIR Take 1 tablet (250 mg total) by mouth daily.   Fish Oil 1200 MG Caps Take 1,200 mg by mouth daily.   methocarbamol 500 MG tablet Commonly known as: ROBAXIN Take 1 tablet (500 mg total) by mouth 3 (three) times daily as needed for muscle spasms.   metoprolol tartrate 50 MG tablet Commonly known as: LOPRESSOR Take 1 tablet (50 mg total) by mouth 2 (two) times daily.   mirtazapine 7.5 MG tablet Commonly known as: REMERON Take 1 tablet (7.5 mg total) by mouth at bedtime.   multivitamin tablet Take 1 tablet by mouth daily.  ondansetron 8 MG tablet Commonly known as: ZOFRAN Take 1 tablet (8 mg total) by mouth every 8 (eight) hours as needed for nausea or vomiting.   oxybutynin 10 MG 24 hr tablet Commonly known as: DITROPAN-XL Take 10 mg by mouth daily.   pantoprazole 40 MG tablet Commonly known as: PROTONIX Take 1 tablet (40 mg total) by mouth at bedtime.   PROBIOTIC PO Take 1 tablet by mouth daily.   tamsulosin 0.4 MG Caps capsule Commonly known as: FLOMAX Take 0.4 mg by mouth daily.   traMADol 50 MG tablet Commonly known as: ULTRAM Take 1 tablet (50 mg total) by mouth every 6 (six) hours as  needed.   VITAMIN B-12 PO Take 5,000 mcg by mouth daily.       Allergies: No Known Allergies  Past Medical History, Surgical history, Social history, and Family History were reviewed and updated.  Review of Systems: All other 10 point review of systems is negative.   Physical Exam:  vitals were not taken for this visit.   Wt Readings from Last 3 Encounters:  08/11/19 200 lb 3.2 oz (90.8 kg)  08/04/19 198 lb (89.8 kg)  07/21/19 198 lb 4 oz (89.9 kg)    Ocular: Sclerae unicteric, pupils equal, round and reactive to light Ear-nose-throat: Oropharynx clear, dentition fair Lymphatic: No cervical or supraclavicular adenopathy Lungs no rales or rhonchi, good excursion bilaterally Heart regular rate and rhythm, no murmur appreciated Abd soft, nontender, positive bowel sounds, no liver or spleen tip palpated on exam, no fluid wave  MSK no focal spinal tenderness, no joint edema Neuro: non-focal, well-oriented, appropriate affect Breasts: Deferred   Lab Results  Component Value Date   WBC 2.6 (L) 08/17/2019   HGB 6.7 (LL) 08/17/2019   HCT 20.1 (L) 08/17/2019   MCV 97.6 08/17/2019   PLT 21 (L) 08/17/2019   Lab Results  Component Value Date   FERRITIN 468 (H) 07/21/2019   IRON 195 (H) 07/21/2019   TIBC 235 07/21/2019   UIBC 40 (L) 07/21/2019   IRONPCTSAT 83 (H) 07/21/2019   Lab Results  Component Value Date   RETICCTPCT 2.1 02/25/2019   RBC 2.06 (L) 08/17/2019   Lab Results  Component Value Date   KPAFRELGTCHN 1,026.8 (H) 06/30/2019   LAMBDASER 2.9 (L) 06/30/2019   KAPLAMBRATIO 354.07 (H) 06/30/2019   Lab Results  Component Value Date   IGGSERUM 77 (L) 06/30/2019   IGA 1,879 (H) 06/30/2019   IGMSERUM <5 (L) 06/30/2019   Lab Results  Component Value Date   TOTALPROTELP 7.0 06/30/2019   ALBUMINELP 3.5 06/30/2019   A1GS 0.2 06/30/2019   A2GS 0.9 06/30/2019   BETS 1.9 (H) 06/30/2019   BETA2SER 2.0 (H) 12/08/2014   GAMS 0.4 06/30/2019   MSPIKE 1.0 (H)  06/30/2019   SPEI Comment 06/30/2019     Chemistry      Component Value Date/Time   NA 137 07/21/2019 0836   NA 141 01/21/2017 0741   NA 139 06/14/2016 1246   K 3.9 07/21/2019 0836   K 4.0 01/21/2017 0741   K 3.8 06/14/2016 1246   CL 102 07/21/2019 0836   CL 103 01/21/2017 0741   CO2 25 07/21/2019 0836   CO2 30 01/21/2017 0741   CO2 24 06/14/2016 1246   BUN 15 07/21/2019 0836   BUN 20 01/21/2017 0741   BUN 15.0 06/14/2016 1246   CREATININE 0.85 07/21/2019 0836   CREATININE 1.0 01/21/2017 0741   CREATININE 0.9 06/14/2016 1246  Component Value Date/Time   CALCIUM 8.9 07/21/2019 0836   CALCIUM 9.0 01/21/2017 0741   CALCIUM 9.6 06/14/2016 1246   ALKPHOS 39 07/21/2019 0836   ALKPHOS 37 01/21/2017 0741   ALKPHOS 54 06/14/2016 1246   AST 14 (L) 07/21/2019 0836   AST 25 06/14/2016 1246   ALT 11 07/21/2019 0836   ALT 29 01/21/2017 0741   ALT 21 06/14/2016 1246   BILITOT 0.4 07/21/2019 0836   BILITOT 0.37 06/14/2016 1246       Impression and Plan: Gene Hill is a very pleasant 79 yo gentleman with IgA kappa myeloma,high risk cytogenetics with a 17p- abnormality.  He was due to start Melflufen today but is quite anemia. I spoke with Dr. Marin Olp and we will hold treatment today and give 2 units of blood tomorrow.  He will be here tomorrow at 9 am. He is going home accessed tonight for tomorrows infusion per his request.  We will then see him back in a week and resume treatment if his counts have improved.  They are in agreement with the plan and will contact our office with any questions or concerns.   [name], NP 7/26/202110:42 AM

## 2019-08-18 ENCOUNTER — Telehealth: Payer: Self-pay | Admitting: Internal Medicine

## 2019-08-18 ENCOUNTER — Inpatient Hospital Stay: Payer: PPO

## 2019-08-18 DIAGNOSIS — C9000 Multiple myeloma not having achieved remission: Secondary | ICD-10-CM

## 2019-08-18 DIAGNOSIS — D649 Anemia, unspecified: Secondary | ICD-10-CM

## 2019-08-18 LAB — IGG, IGA, IGM
IgA: 2536 mg/dL — ABNORMAL HIGH (ref 61–437)
IgG (Immunoglobin G), Serum: 78 mg/dL — ABNORMAL LOW (ref 603–1613)
IgM (Immunoglobulin M), Srm: <5 mg/dL — ABNORMAL LOW (ref 15–143)

## 2019-08-18 LAB — KAPPA/LAMBDA LIGHT CHAINS
Kappa free light chain: 1575.6 mg/L — ABNORMAL HIGH (ref 3.3–19.4)
Kappa, lambda light chain ratio: 235.16 — ABNORMAL HIGH (ref 0.26–1.65)
Lambda free light chains: 6.7 mg/L (ref 5.7–26.3)

## 2019-08-18 MED ORDER — SODIUM CHLORIDE 0.9% IV SOLUTION
250.0000 mL | Freq: Once | INTRAVENOUS | Status: DC
  Filled 2019-08-18: qty 250

## 2019-08-18 MED ORDER — FUROSEMIDE 10 MG/ML IJ SOLN
20.0000 mg | Freq: Once | INTRAMUSCULAR | Status: AC
  Administered 2019-08-18: 20 mg via INTRAVENOUS

## 2019-08-18 MED ORDER — FUROSEMIDE 10 MG/ML IJ SOLN
INTRAMUSCULAR | Status: AC
  Filled 2019-08-18: qty 4

## 2019-08-18 MED ORDER — DIPHENHYDRAMINE HCL 25 MG PO CAPS: 25.0000 mg | ORAL_CAPSULE | Freq: Once | ORAL | Status: DC

## 2019-08-18 MED ORDER — ACETAMINOPHEN 325 MG PO TABS: 650.0000 mg | ORAL_TABLET | Freq: Once | ORAL | Status: DC

## 2019-08-18 MED ORDER — SODIUM CHLORIDE 0.9% FLUSH
10.0000 mL | INTRAVENOUS | Status: AC | PRN
  Administered 2019-08-18: 10 mL
  Filled 2019-08-18: qty 10

## 2019-08-18 MED ORDER — HEPARIN SOD (PORK) LOCK FLUSH 100 UNIT/ML IV SOLN
500.0000 [IU] | Freq: Every day | INTRAVENOUS | Status: AC | PRN
  Administered 2019-08-18: 500 [IU]
  Filled 2019-08-18: qty 5

## 2019-08-18 NOTE — Progress Notes (Signed)
  Chronic Care Management   Note  08/18/2019 Name: [name] MRN: [id number] DOB: October 13, 1940  Gene Hill is a 79 y.o. year old male who is a primary care patient of Colon Branch, MD. I reached out to Gene Hill by phone today in response to a referral sent by Mr. Bain Whichard Schrier's PCP, Colon Branch, MD.   Mr. Stamos was given information about Chronic Care Management services today including:  1. CCM service includes personalized support from designated clinical staff supervised by his physician, including individualized plan of care and coordination with other care providers 2. 24/7 contact phone numbers for assistance for urgent and routine care needs. 3. Service will only be billed when office clinical staff spend 20 minutes or more in a month to coordinate care. 4. Only one practitioner may furnish and bill the service in a calendar month. 5. The patient may stop CCM services at any time (effective at the end of the month) by phone call to the office staff.   Patient wishes to consider information provided and/or speak with a member of the care team before deciding about enrollment in care management services.   Follow up plan:   Carley Perdue [employer] Scheduler

## 2019-08-19 ENCOUNTER — Other Ambulatory Visit: Payer: Self-pay | Admitting: Hematology & Oncology

## 2019-08-19 LAB — BPAM RBC
Blood Product Expiration Date: 202108022359
Blood Product Expiration Date: 202108172359
ISSUE DATE / TIME: 202107270755
ISSUE DATE / TIME: 202107270755
Unit Type and Rh: 600
Unit Type and Rh: 6200

## 2019-08-19 LAB — TYPE AND SCREEN
ABO/RH(D): A POS
Antibody Screen: NEGATIVE
Unit division: 0
Unit division: 0

## 2019-08-21 DIAGNOSIS — R350 Frequency of micturition: Secondary | ICD-10-CM | POA: Diagnosis not present

## 2019-08-21 DIAGNOSIS — R351 Nocturia: Secondary | ICD-10-CM | POA: Diagnosis not present

## 2019-08-21 LAB — PROTEIN ELECTROPHORESIS, SERUM, WITH REFLEX
A/G Ratio: 0.9 (ref 0.7–1.7)
Albumin ELP: 3.7 g/dL (ref 2.9–4.4)
Alpha-1-Globulin: 0.2 g/dL (ref 0.0–0.4)
Alpha-2-Globulin: 0.8 g/dL (ref 0.4–1.0)
Beta Globulin: 2.5 g/dL — ABNORMAL HIGH (ref 0.7–1.3)
Gamma Globulin: 0.6 g/dL (ref 0.4–1.8)
Globulin, Total: 4 g/dL — ABNORMAL HIGH (ref 2.2–3.9)
M-Spike, %: 1.1 g/dL — ABNORMAL HIGH
SPEP Interpretation: 0
Total Protein ELP: 7.7 g/dL (ref 6.0–8.5)

## 2019-08-21 LAB — IMMUNOFIXATION REFLEX, SERUM
IgA: 6233 mg/dL — ABNORMAL HIGH (ref 61–437)
IgG (Immunoglobin G), Serum: 103 mg/dL — ABNORMAL LOW (ref 603–1613)
IgM (Immunoglobulin M), Srm: <5 mg/dL — ABNORMAL LOW (ref 15–143)

## 2019-08-24 ENCOUNTER — Inpatient Hospital Stay: Payer: PPO | Attending: Hematology & Oncology

## 2019-08-24 ENCOUNTER — Other Ambulatory Visit: Payer: Self-pay

## 2019-08-24 ENCOUNTER — Encounter: Payer: Self-pay | Admitting: Hematology & Oncology

## 2019-08-24 ENCOUNTER — Inpatient Hospital Stay: Payer: PPO

## 2019-08-24 ENCOUNTER — Inpatient Hospital Stay (HOSPITAL_BASED_OUTPATIENT_CLINIC_OR_DEPARTMENT_OTHER): Payer: PPO | Admitting: Hematology & Oncology

## 2019-08-24 VITALS — Wt 196.0 lb

## 2019-08-24 DIAGNOSIS — D649 Anemia, unspecified: Secondary | ICD-10-CM

## 2019-08-24 DIAGNOSIS — C9000 Multiple myeloma not having achieved remission: Secondary | ICD-10-CM

## 2019-08-24 DIAGNOSIS — Z5111 Encounter for antineoplastic chemotherapy: Secondary | ICD-10-CM | POA: Insufficient documentation

## 2019-08-24 DIAGNOSIS — C9002 Multiple myeloma in relapse: Secondary | ICD-10-CM | POA: Diagnosis not present

## 2019-08-24 DIAGNOSIS — Z79899 Other long term (current) drug therapy: Secondary | ICD-10-CM | POA: Diagnosis not present

## 2019-08-24 DIAGNOSIS — D61810 Antineoplastic chemotherapy induced pancytopenia: Secondary | ICD-10-CM | POA: Diagnosis not present

## 2019-08-24 DIAGNOSIS — Z5189 Encounter for other specified aftercare: Secondary | ICD-10-CM | POA: Insufficient documentation

## 2019-08-24 DIAGNOSIS — T451X5A Adverse effect of antineoplastic and immunosuppressive drugs, initial encounter: Secondary | ICD-10-CM | POA: Diagnosis not present

## 2019-08-24 LAB — CMP (CANCER CENTER ONLY)
ALT: 11 U/L (ref 0–44)
AST: 22 U/L (ref 15–41)
Albumin: 3.9 g/dL (ref 3.5–5.0)
Alkaline Phosphatase: 41 U/L (ref 38–126)
Anion gap: 10 (ref 5–15)
BUN: 13 mg/dL (ref 8–23)
CO2: 26 mmol/L (ref 22–32)
Calcium: 9.9 mg/dL (ref 8.9–10.3)
Chloride: 101 mmol/L (ref 98–111)
Creatinine: 0.94 mg/dL (ref 0.61–1.24)
GFR, Est AFR Am: >60 mL/min (ref >60)
GFR, Estimated: >60 mL/min (ref >60)
Glucose, Bld: 102 mg/dL — ABNORMAL HIGH (ref 70–99)
Potassium: 3.9 mmol/L (ref 3.5–5.1)
Sodium: 137 mmol/L (ref 135–145)
Total Bilirubin: 0.4 mg/dL (ref 0.3–1.2)
Total Protein: 7.8 g/dL (ref 6.5–8.1)

## 2019-08-24 LAB — CBC WITH DIFFERENTIAL (CANCER CENTER ONLY)
Abs Immature Granulocytes: 0.16 10*3/uL — ABNORMAL HIGH (ref 0.00–0.07)
Basophils Absolute: 0 10*3/uL (ref 0.0–0.1)
Basophils Relative: 0 %
Eosinophils Absolute: 0 10*3/uL (ref 0.0–0.5)
Eosinophils Relative: 1 %
HCT: 24.2 % — ABNORMAL LOW (ref 39.0–52.0)
Hemoglobin: 8 g/dL — ABNORMAL LOW (ref 13.0–17.0)
Immature Granulocytes: 5 %
Lymphocytes Relative: 45 %
Lymphs Abs: 1.5 10*3/uL (ref 0.7–4.0)
MCH: 31.7 pg (ref 26.0–34.0)
MCHC: 33.1 g/dL (ref 30.0–36.0)
MCV: 96 fL (ref 80.0–100.0)
Monocytes Absolute: 0.7 10*3/uL (ref 0.1–1.0)
Monocytes Relative: 22 %
Neutro Abs: 0.9 10*3/uL — ABNORMAL LOW (ref 1.7–7.7)
Neutrophils Relative %: 27 %
Platelet Count: 36 10*3/uL — ABNORMAL LOW (ref 150–400)
RBC: 2.52 MIL/uL — ABNORMAL LOW (ref 4.22–5.81)
RDW: 16 % — ABNORMAL HIGH (ref 11.5–15.5)
WBC Count: 3.2 10*3/uL — ABNORMAL LOW (ref 4.0–10.5)
nRBC: 6 % — ABNORMAL HIGH (ref 0.0–0.2)

## 2019-08-24 MED ORDER — LIDOCAINE-PRILOCAINE 2.5-2.5 % EX CREA: TOPICAL_CREAM | CUTANEOUS | 3 refills | Status: DC

## 2019-08-24 MED ORDER — LORAZEPAM 0.5 MG PO TABS: 0.5000 mg | ORAL_TABLET | Freq: Four times a day (QID) | ORAL | 0 refills | Status: DC | PRN

## 2019-08-24 MED ORDER — SODIUM CHLORIDE 0.9 % IV SOLN
40.0000 mg | Freq: Once | INTRAVENOUS | Status: AC
  Administered 2019-08-24: 40 mg via INTRAVENOUS
  Filled 2019-08-24: qty 4

## 2019-08-24 MED ORDER — PALONOSETRON HCL INJECTION 0.25 MG/5ML
0.2500 mg | Freq: Once | INTRAVENOUS | Status: AC
  Administered 2019-08-24: 0.25 mg via INTRAVENOUS

## 2019-08-24 MED ORDER — HEPARIN SOD (PORK) LOCK FLUSH 100 UNIT/ML IV SOLN
500.0000 [IU] | Freq: Once | INTRAVENOUS | Status: AC | PRN
  Administered 2019-08-24: 500 [IU]
  Filled 2019-08-24: qty 5

## 2019-08-24 MED ORDER — SODIUM CHLORIDE 0.9% FLUSH
10.0000 mL | INTRAVENOUS | Status: DC | PRN
  Administered 2019-08-24: 10 mL
  Filled 2019-08-24: qty 10

## 2019-08-24 MED ORDER — PALONOSETRON HCL INJECTION 0.25 MG/5ML
INTRAVENOUS | Status: AC
  Filled 2019-08-24: qty 5

## 2019-08-24 MED ORDER — SODIUM CHLORIDE 0.9 % IV SOLN
Freq: Once | INTRAVENOUS | Status: AC
  Filled 2019-08-24: qty 250

## 2019-08-24 MED ORDER — SODIUM CHLORIDE 0.9 % IV SOLN
40.0000 mg | Freq: Once | INTRAVENOUS | Status: AC
  Administered 2019-08-24: 40 mg via INTRAVENOUS
  Filled 2019-08-24: qty 80

## 2019-08-24 MED ORDER — PROCHLORPERAZINE MALEATE 10 MG PO TABS: 10.0000 mg | ORAL_TABLET | Freq: Four times a day (QID) | ORAL | 1 refills | Status: DC | PRN

## 2019-08-24 MED ORDER — ONDANSETRON HCL 8 MG PO TABS: 8.0000 mg | ORAL_TABLET | Freq: Two times a day (BID) | ORAL | 1 refills | Status: DC | PRN

## 2019-08-24 NOTE — Progress Notes (Signed)
Hematology and Oncology Follow Up Visit  Gene Hill [id number] 05/08/1940 79 y.o. 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 He just did not tolerate the Selinexor all that well.  He has had profound pancytopenia.  We have had to transfuse him red blood cells and platelets.  More last saw him in July, his monoclonal spike was 1.1 g/dL.  His IgA level was 2536 mg/dL.  The kappa light chain was 154 mg/dL.  His blood counts are starting to improve a little bit.  I have to believe that this is from the [place] following getting out of his system.  I do think that Melflufen would be a good idea for him.  I think he could tolerate it.  I think the toxicity profile would be tolerable.  He has had no problems with fever.  He has had no issues with nausea or vomiting.  He does feel a little weak.  He has had no bleeding.  Apparently had a little bit of blood from the right ear over the weekend.  I looked into the ear canal, there was a little bit of irritated area in the external ear canal.  He has had no cough or shortness of breath.  His back has been doing okay.  His back does bother him a little bit.  This is been chronic.  Overall, his performance status is ECOG 1.    Medications:  Allergies as of 08/24/2019   No Known Allergies     Medication List       Accurate as of August 24, 2019  2:10 PM. If you have any  questions, ask your nurse or doctor.        acetaminophen 500 MG tablet Commonly known as: TYLENOL Take 1,000 mg by mouth 3 (three) times daily as needed.   aspirin 325 MG tablet Take 325 mg by mouth daily.   b complex vitamins tablet Take 1 tablet by mouth daily.   calcium carbonate 1500 (600 Ca) MG Tabs tablet Commonly known as: OSCAL Take 600 mg of elemental calcium by mouth daily with breakfast.   dronabinol 2.5 MG capsule Commonly known as: MARINOL TAKE 1 CAPSULE(2.5 MG) BY MOUTH TWICE DAILY BEFORE A MEAL   famciclovir 250 MG tablet Commonly known as: FAMVIR Take 1 tablet (250 mg total) by mouth daily.   Fish Oil 1200 MG Caps Take 1,200 mg by mouth daily.   lidocaine-prilocaine cream Commonly known as: EMLA Apply to affected area once   LORazepam 0.5 MG tablet Commonly known as: Ativan Take 1 tablet (0.5 mg total) by mouth every 6 (six) hours as needed (Nausea or vomiting).   methocarbamol 500 MG tablet Commonly known as: ROBAXIN Take 1 tablet (500 mg total) by mouth 3 (three) times daily as needed for muscle spasms.   metoprolol tartrate 50 MG tablet Commonly known as: LOPRESSOR Take 1  tablet (50 mg total) by mouth 2 (two) times daily.   mirtazapine 7.5 MG tablet Commonly known as: REMERON Take 1 tablet (7.5 mg total) by mouth at bedtime.   multivitamin tablet Take 1 tablet by mouth daily.   ondansetron 8 MG tablet Commonly known as: ZOFRAN Take 1 tablet (8 mg total) by mouth every 8 (eight) hours as needed for nausea or vomiting. What changed: Another medication with the same name was added. Make sure you understand how and when to take each.   ondansetron 8 MG tablet Commonly known as: Zofran Take 1 tablet (8 mg total) by mouth 2 (two) times daily as needed for refractory nausea / vomiting. Start on day 3 after chemotherapy. What changed: You were already taking a medication with the same name, and this prescription was added. Make sure you  understand how and when to take each.   oxybutynin 10 MG 24 hr tablet Commonly known as: DITROPAN-XL Take 10 mg by mouth daily.   pantoprazole 40 MG tablet Commonly known as: PROTONIX Take 1 tablet (40 mg total) by mouth at bedtime.   PROBIOTIC PO Take 1 tablet by mouth daily.   prochlorperazine 10 MG tablet Commonly known as: COMPAZINE Take 1 tablet (10 mg total) by mouth every 6 (six) hours as needed (Nausea or vomiting).   tamsulosin 0.4 MG Caps capsule Commonly known as: FLOMAX Take 0.4 mg by mouth daily.   traMADol 50 MG tablet Commonly known as: ULTRAM Take 1 tablet (50 mg total) by mouth every 6 (six) hours as needed.   VITAMIN B-12 PO Take 5,000 mcg by mouth daily.       Allergies: No Known Allergies  Past Medical History, Surgical history, Social history, and Family History were reviewed and updated.  Review of Systems: Review of Systems  Constitutional: Positive for malaise/fatigue.  HENT: Negative.   Eyes: Negative.   Respiratory: Positive for shortness of breath.   Cardiovascular: Negative.   Gastrointestinal: Positive for nausea.  Genitourinary: Negative.   Musculoskeletal: Positive for back pain and joint pain.  Skin: Negative.   Neurological: Positive for weakness.  Endo/Heme/Allergies: Bruises/bleeds easily.  Psychiatric/Behavioral: Negative.      Physical Exam:  vitals were not taken for this visit.   Wt Readings from Last 3 Encounters:  08/24/19 196 lb (88.9 kg)  08/17/19 197 lb 6.4 oz (89.5 kg)  08/11/19 200 lb 3.2 oz (90.8 kg)    His vital signs are temperature of 97.8.  Pulse 71.  Blood pressure 123/65.  Weight is 198 pounds.  Physical Exam Vitals reviewed.  HENT:     Head: Normocephalic and atraumatic.  Eyes:     Pupils: Pupils are equal, round, and reactive to light.  Cardiovascular:     Rate and Rhythm: Normal rate and regular rhythm.     Heart sounds: Normal heart sounds.  Pulmonary:     Effort: Pulmonary effort is  normal.     Breath sounds: Normal breath sounds.  Abdominal:     General: Bowel sounds are normal.     Palpations: Abdomen is soft.  Musculoskeletal:        General: No tenderness or deformity. Normal range of motion.     Cervical back: Normal range of motion.  Lymphadenopathy:     Cervical: No cervical adenopathy.  Skin:    General: Skin is warm and dry.     Findings: No erythema or rash.  Neurological:     Mental Status: He is alert and oriented  to person, place, and time.  Psychiatric:        Behavior: Behavior normal.        Thought Content: Thought content normal.        Judgment: Judgment normal.      Lab Results  Component Value Date   WBC 3.2 (L) 08/24/2019   HGB 8.0 (L) 08/24/2019   HCT 24.2 (L) 08/24/2019   MCV 96.0 08/24/2019   PLT 36 (L) 08/24/2019   Lab Results  Component Value Date   FERRITIN 468 (H) 07/21/2019   IRON 195 (H) 07/21/2019   TIBC 235 07/21/2019   UIBC 40 (L) 07/21/2019   IRONPCTSAT 83 (H) 07/21/2019   Lab Results  Component Value Date   RETICCTPCT 2.1 02/25/2019   RBC 2.52 (L) 08/24/2019   Lab Results  Component Value Date   KPAFRELGTCHN 1,575.6 (H) 08/17/2019   LAMBDASER 6.7 08/17/2019   KAPLAMBRATIO 235.16 (H) 08/17/2019   Lab Results  Component Value Date   IGGSERUM 103 (L) 08/17/2019   IGA 6,233 (H) 08/17/2019   IGMSERUM <5 (L) 08/17/2019   Lab Results  Component Value Date   TOTALPROTELP 7.7 08/17/2019   ALBUMINELP 3.7 08/17/2019   A1GS 0.2 08/17/2019   A2GS 0.8 08/17/2019   BETS 2.5 (H) 08/17/2019   BETA2SER 2.0 (H) 12/08/2014   GAMS 0.6 08/17/2019   MSPIKE 1.1 (H) 08/17/2019   SPEI Comment 06/30/2019     Chemistry      Component Value Date/Time   NA 137 08/24/2019 1140   NA 141 01/21/2017 0741   NA 139 06/14/2016 1246   K 3.9 08/24/2019 1140   K 4.0 01/21/2017 0741   K 3.8 06/14/2016 1246   CL 101 08/24/2019 1140   CL 103 01/21/2017 0741   CO2 26 08/24/2019 1140   CO2 30 01/21/2017 0741   CO2 24  06/14/2016 1246   BUN 13 08/24/2019 1140   BUN 20 01/21/2017 0741   BUN 15.0 06/14/2016 1246   CREATININE 0.94 08/24/2019 1140   CREATININE 1.0 01/21/2017 0741   CREATININE 0.9 06/14/2016 1246      Component Value Date/Time   CALCIUM 9.9 08/24/2019 1140   CALCIUM 9.0 01/21/2017 0741   CALCIUM 9.6 06/14/2016 1246   ALKPHOS 41 08/24/2019 1140   ALKPHOS 37 01/21/2017 0741   ALKPHOS 54 06/14/2016 1246   AST 22 08/24/2019 1140   AST 25 06/14/2016 1246   ALT 11 08/24/2019 1140   ALT 29 01/21/2017 0741   ALT 21 06/14/2016 1246   BILITOT 0.4 08/24/2019 1140   BILITOT 0.37 06/14/2016 1246       Impression and Plan: Mr. Ransford is a very pleasant 79 yo caucasian gentleman with history of IgA kappa myeloma, high risk cytogenetics with a 17p- abnormality.   Actually, I am surprised that he is done so well despite having the 17p-abnormality.  I do think that we have 1 more chance to try to help him.  I would like to think that using the Melflufen will help.  This works in a whole different manner than what he has had before.  I think the side effect profile is tolerable.  Again, he just wants quality of life.  And to help with his quality of life, he would be able to work in his garden.  Has a very nice garden and he brings in vegetables all the time.  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  We  will go ahead with his first cycle today.  We slide to follow him weekly.  It is conceivable that he still may need to be transfused.    [name], MD 8/2/20212:10 PM

## 2019-08-24 NOTE — Patient Instructions (Signed)
[facility] Discharge Instructions for Patients Receiving Chemotherapy  Today you received the following chemotherapy agents Pepaxto  To help prevent nausea and vomiting after your treatment, we encourage you to take your nausea medication as prescribed by MD. **DO NOT TAKE ZOFRAN FOR 3 DAYS AFTER CHEMOTHERAPY**   If you develop nausea and vomiting that is not controlled by your nausea medication, call the clinic.   BELOW ARE SYMPTOMS THAT SHOULD BE REPORTED IMMEDIATELY:  *FEVER GREATER THAN 100.5 F  *CHILLS WITH OR WITHOUT FEVER  NAUSEA AND VOMITING THAT IS NOT CONTROLLED WITH YOUR NAUSEA MEDICATION  *UNUSUAL SHORTNESS OF BREATH  *UNUSUAL BRUISING OR BLEEDING  TENDERNESS IN MOUTH AND THROAT WITH OR WITHOUT PRESENCE OF ULCERS  *URINARY PROBLEMS  *BOWEL PROBLEMS  UNUSUAL RASH Items with * indicate a potential emergency and should be followed up as soon as possible.  Feel free to call the clinic should you have any questions or concerns. The clinic phone number is [phone].  Please show the CHEMO ALERT CARD at check-in to the Emergency Department and triage nurse.   

## 2019-08-24 NOTE — Patient Instructions (Signed)

## 2019-08-24 NOTE — Progress Notes (Signed)
Labs reviewed by MD ok to treat despite counts 

## 2019-08-25 LAB — LACTATE DEHYDROGENASE: LDH: 404 U/L — ABNORMAL HIGH (ref 98–192)

## 2019-08-28 ENCOUNTER — Other Ambulatory Visit: Payer: Self-pay | Admitting: Hematology & Oncology

## 2019-08-28 DIAGNOSIS — R350 Frequency of micturition: Secondary | ICD-10-CM | POA: Diagnosis not present

## 2019-08-31 ENCOUNTER — Other Ambulatory Visit: Payer: Self-pay | Admitting: Family

## 2019-08-31 ENCOUNTER — Inpatient Hospital Stay: Payer: PPO

## 2019-08-31 ENCOUNTER — Other Ambulatory Visit: Payer: Self-pay

## 2019-08-31 ENCOUNTER — Other Ambulatory Visit: Payer: Self-pay | Admitting: *Deleted

## 2019-08-31 ENCOUNTER — Other Ambulatory Visit: Payer: Self-pay | Admitting: Hematology & Oncology

## 2019-08-31 ENCOUNTER — Other Ambulatory Visit: Payer: Self-pay | Admitting: Pharmacist

## 2019-08-31 ENCOUNTER — Telehealth: Payer: Self-pay | Admitting: *Deleted

## 2019-08-31 DIAGNOSIS — C9000 Multiple myeloma not having achieved remission: Secondary | ICD-10-CM

## 2019-08-31 DIAGNOSIS — D649 Anemia, unspecified: Secondary | ICD-10-CM

## 2019-08-31 DIAGNOSIS — Z5111 Encounter for antineoplastic chemotherapy: Secondary | ICD-10-CM | POA: Diagnosis not present

## 2019-08-31 LAB — CBC WITH DIFFERENTIAL (CANCER CENTER ONLY)
Abs Immature Granulocytes: 0 10*3/uL (ref 0.00–0.07)
Basophils Absolute: 0 10*3/uL (ref 0.0–0.1)
Basophils Relative: 1 %
Eosinophils Absolute: 0 10*3/uL (ref 0.0–0.5)
Eosinophils Relative: 0 %
HCT: 22 % — ABNORMAL LOW (ref 39.0–52.0)
Hemoglobin: 7.3 g/dL — ABNORMAL LOW (ref 13.0–17.0)
Immature Granulocytes: 0 %
Lymphocytes Relative: 31 %
Lymphs Abs: 0.2 10*3/uL — ABNORMAL LOW (ref 0.7–4.0)
MCH: 32.2 pg (ref 26.0–34.0)
MCHC: 33.2 g/dL (ref 30.0–36.0)
MCV: 96.9 fL (ref 80.0–100.0)
Monocytes Absolute: 0.1 10*3/uL (ref 0.1–1.0)
Monocytes Relative: 15 %
Neutro Abs: 0.4 10*3/uL — CL (ref 1.7–7.7)
Neutrophils Relative %: 53 %
Platelet Count: 32 10*3/uL — ABNORMAL LOW (ref 150–400)
RBC: 2.27 MIL/uL — ABNORMAL LOW (ref 4.22–5.81)
RDW: 16.1 % — ABNORMAL HIGH (ref 11.5–15.5)
WBC Count: 0.7 10*3/uL — CL (ref 4.0–10.5)
nRBC: 2.8 % — ABNORMAL HIGH (ref 0.0–0.2)

## 2019-08-31 LAB — BASIC METABOLIC PANEL - CANCER CENTER ONLY
Anion gap: 8 (ref 5–15)
BUN: 24 mg/dL — ABNORMAL HIGH (ref 8–23)
CO2: 27 mmol/L (ref 22–32)
Calcium: 9.5 mg/dL (ref 8.9–10.3)
Chloride: 102 mmol/L (ref 98–111)
Creatinine: 0.91 mg/dL (ref 0.61–1.24)
GFR, Est AFR Am: >60 mL/min (ref >60)
GFR, Estimated: >60 mL/min (ref >60)
Glucose, Bld: 102 mg/dL — ABNORMAL HIGH (ref 70–99)
Potassium: 4.1 mmol/L (ref 3.5–5.1)
Sodium: 137 mmol/L (ref 135–145)

## 2019-08-31 LAB — PREPARE RBC (CROSSMATCH)

## 2019-08-31 MED ORDER — PEGFILGRASTIM-CBQV 6 MG/0.6ML ~~LOC~~ SOSY
6.0000 mg | PREFILLED_SYRINGE | Freq: Once | SUBCUTANEOUS | Status: AC
  Administered 2019-08-31: 6 mg via SUBCUTANEOUS

## 2019-08-31 MED ORDER — PEGFILGRASTIM-CBQV 6 MG/0.6ML ~~LOC~~ SOSY
PREFILLED_SYRINGE | SUBCUTANEOUS | Status: AC
  Filled 2019-08-31: qty 0.6

## 2019-08-31 MED ORDER — LORAZEPAM 0.5 MG PO TABS: 0.5000 mg | ORAL_TABLET | Freq: Four times a day (QID) | ORAL | 0 refills | Status: DC | PRN

## 2019-08-31 NOTE — Telephone Encounter (Signed)
Dr. Marin Olp notified of hgb-7.3, wbc-0.7, platelets-32 and anc-0.4.  Order received from Dr. Marin Olp for pt to get two units of PRBC's and Neupogen. Pt will get Neupogen today and will get two units of blood tomorrow per his request.  Message sent to scheduling.

## 2019-08-31 NOTE — Patient Instructions (Signed)

## 2019-09-01 ENCOUNTER — Inpatient Hospital Stay: Payer: PPO

## 2019-09-01 VITALS — BP 149/74 | HR 74 | Temp 97.8°F | Resp 17

## 2019-09-01 DIAGNOSIS — D649 Anemia, unspecified: Secondary | ICD-10-CM

## 2019-09-01 DIAGNOSIS — Z95828 Presence of other vascular implants and grafts: Secondary | ICD-10-CM

## 2019-09-01 DIAGNOSIS — Z5111 Encounter for antineoplastic chemotherapy: Secondary | ICD-10-CM | POA: Diagnosis not present

## 2019-09-01 DIAGNOSIS — C9000 Multiple myeloma not having achieved remission: Secondary | ICD-10-CM

## 2019-09-01 MED ORDER — HEPARIN SOD (PORK) LOCK FLUSH 100 UNIT/ML IV SOLN
500.0000 [IU] | Freq: Once | INTRAVENOUS | Status: AC
  Administered 2019-09-01: 500 [IU] via INTRAVENOUS
  Filled 2019-09-01: qty 5

## 2019-09-01 MED ORDER — SODIUM CHLORIDE 0.9% FLUSH
10.0000 mL | Freq: Once | INTRAVENOUS | Status: AC
  Administered 2019-09-01: 10 mL via INTRAVENOUS
  Filled 2019-09-01: qty 10

## 2019-09-01 NOTE — Patient Instructions (Signed)

## 2019-09-02 LAB — TYPE AND SCREEN
ABO/RH(D): A POS
Antibody Screen: NEGATIVE
Unit division: 0
Unit division: 0

## 2019-09-02 LAB — BPAM RBC
Blood Product Expiration Date: 202108312359
Blood Product Expiration Date: 202109012359
ISSUE DATE / TIME: 202108100736
ISSUE DATE / TIME: 202108100736
Unit Type and Rh: 6200
Unit Type and Rh: 6200

## 2019-09-04 DIAGNOSIS — R350 Frequency of micturition: Secondary | ICD-10-CM | POA: Diagnosis not present

## 2019-09-07 ENCOUNTER — Other Ambulatory Visit: Payer: Self-pay

## 2019-09-07 ENCOUNTER — Inpatient Hospital Stay: Payer: PPO

## 2019-09-07 ENCOUNTER — Other Ambulatory Visit: Payer: Self-pay | Admitting: Family

## 2019-09-07 ENCOUNTER — Other Ambulatory Visit: Payer: Self-pay | Admitting: *Deleted

## 2019-09-07 DIAGNOSIS — C9000 Multiple myeloma not having achieved remission: Secondary | ICD-10-CM

## 2019-09-07 DIAGNOSIS — Z5111 Encounter for antineoplastic chemotherapy: Secondary | ICD-10-CM | POA: Diagnosis not present

## 2019-09-07 DIAGNOSIS — D696 Thrombocytopenia, unspecified: Secondary | ICD-10-CM

## 2019-09-07 LAB — CMP (CANCER CENTER ONLY)
ALT: 11 U/L (ref 0–44)
AST: 15 U/L (ref 15–41)
Albumin: 4 g/dL (ref 3.5–5.0)
Alkaline Phosphatase: 50 U/L (ref 38–126)
Anion gap: 9 (ref 5–15)
BUN: 18 mg/dL (ref 8–23)
CO2: 28 mmol/L (ref 22–32)
Calcium: 9.7 mg/dL (ref 8.9–10.3)
Chloride: 102 mmol/L (ref 98–111)
Creatinine: 0.99 mg/dL (ref 0.61–1.24)
GFR, Est AFR Am: >60 mL/min (ref >60)
GFR, Estimated: >60 mL/min (ref >60)
Glucose, Bld: 105 mg/dL — ABNORMAL HIGH (ref 70–99)
Potassium: 4 mmol/L (ref 3.5–5.1)
Sodium: 139 mmol/L (ref 135–145)
Total Bilirubin: 0.5 mg/dL (ref 0.3–1.2)
Total Protein: 6.7 g/dL (ref 6.5–8.1)

## 2019-09-07 LAB — CBC WITH DIFFERENTIAL (CANCER CENTER ONLY)
Abs Immature Granulocytes: 0.09 10*3/uL — ABNORMAL HIGH (ref 0.00–0.07)
Basophils Absolute: 0 10*3/uL (ref 0.0–0.1)
Basophils Relative: 1 %
Eosinophils Absolute: 0 10*3/uL (ref 0.0–0.5)
Eosinophils Relative: 0 %
HCT: 26.3 % — ABNORMAL LOW (ref 39.0–52.0)
Hemoglobin: 8.8 g/dL — ABNORMAL LOW (ref 13.0–17.0)
Immature Granulocytes: 2 %
Lymphocytes Relative: 9 %
Lymphs Abs: 0.3 10*3/uL — ABNORMAL LOW (ref 0.7–4.0)
MCH: 31.4 pg (ref 26.0–34.0)
MCHC: 33.5 g/dL (ref 30.0–36.0)
MCV: 93.9 fL (ref 80.0–100.0)
Monocytes Absolute: 0.4 10*3/uL (ref 0.1–1.0)
Monocytes Relative: 10 %
Neutro Abs: 3 10*3/uL (ref 1.7–7.7)
Neutrophils Relative %: 78 %
Platelet Count: 11 10*3/uL — ABNORMAL LOW (ref 150–400)
RBC: 2.8 MIL/uL — ABNORMAL LOW (ref 4.22–5.81)
RDW: 15.9 % — ABNORMAL HIGH (ref 11.5–15.5)
WBC Count: 3.8 10*3/uL — ABNORMAL LOW (ref 4.0–10.5)
nRBC: 0 % (ref 0.0–0.2)

## 2019-09-07 LAB — SAMPLE TO BLOOD BANK

## 2019-09-07 NOTE — Progress Notes (Signed)
Patient opted to get platelets tomorrow at 12:00 noon, per JoEllen bartko rn.  sorba view dsg with antibacterial patch in place, flushed per protocol.

## 2019-09-08 ENCOUNTER — Inpatient Hospital Stay: Payer: PPO

## 2019-09-08 VITALS — BP 137/66 | HR 60 | Temp 97.9°F | Resp 17

## 2019-09-08 DIAGNOSIS — Z95828 Presence of other vascular implants and grafts: Secondary | ICD-10-CM

## 2019-09-08 DIAGNOSIS — D696 Thrombocytopenia, unspecified: Secondary | ICD-10-CM

## 2019-09-08 DIAGNOSIS — Z5111 Encounter for antineoplastic chemotherapy: Secondary | ICD-10-CM | POA: Diagnosis not present

## 2019-09-08 DIAGNOSIS — C9000 Multiple myeloma not having achieved remission: Secondary | ICD-10-CM

## 2019-09-08 MED ORDER — HEPARIN SOD (PORK) LOCK FLUSH 100 UNIT/ML IV SOLN
500.0000 [IU] | Freq: Once | INTRAVENOUS | Status: AC
  Administered 2019-09-08: 500 [IU] via INTRAVENOUS
  Filled 2019-09-08: qty 5

## 2019-09-08 MED ORDER — SODIUM CHLORIDE 0.9% FLUSH
10.0000 mL | Freq: Once | INTRAVENOUS | Status: AC
  Administered 2019-09-08: 10 mL via INTRAVENOUS
  Filled 2019-09-08: qty 10

## 2019-09-08 NOTE — Patient Instructions (Signed)
Thrombocytopenia Thrombocytopenia means that you have a low number of platelets in your blood. Platelets are tiny cells in the blood. When you bleed, they clump together at the cut or injury to stop the bleeding. This is called blood clotting. If you do not have enough platelets, it can cause bleeding problems. Some cases of this condition are mild while others are more severe. What are the causes? This condition may be caused by:  Your body not making enough platelets. This may be caused by: ? Your bone marrow not making blood cells (aplastic anemia). ? Cancer in the bone marrow. ? Certain medicines. ? Infection in the bone marrow. ? Drinking a lot of alcohol.  Your body destroying platelets too quickly. This may be caused by: ? Certain immune diseases. ? Certain medicines. ? Certain blood clotting disorders. ? Certain disorders that are passed from parent to child (inherited). ? Certain bleeding disorders. ? Pregnancy. ? Having a spleen that is larger than normal. What are the signs or symptoms?  Bleeding that is not normal.  Nosebleeds.  Heavy menstrual periods.  Blood in the pee (urine) or poop (stool).  A purple-like color to the skin (purpura).  Bruising.  A rash that looks like pinpoint, purple-red spots (petechiae). How is this treated?  Treatment of another condition that is causing the low platelet count.  Medicines to help protect your platelets from being destroyed.  A replacement (transfusion) of platelets to stop or prevent bleeding.  Surgery to remove the spleen. Follow these instructions at home: Activity  Avoid activities that could cause you to get hurt or bruised. Follow instructions about how to prevent falls.  Take care not to cut yourself: ? When you shave. ? When you use scissors, needles, knives, or other tools.  Take care not to burn yourself: ? When you use an iron. ? When you cook. General instructions   Check your skin and the  inside of your mouth for bruises or blood as told by your doctor.  Check to see if there is blood in your spit (sputum), pee, and poop. Do this as told by your doctor.  Do not drink alcohol.  Take over-the-counter and prescription medicines only as told by your doctor.  Do not take any medicines that have aspirin or NSAIDs in them. These medicines can thin your blood and cause you to bleed.  Tell all of your doctors that you have this condition. Be sure to tell your dentist and eye doctor too. Contact a doctor if:  You have bruises and you do not know why. Get help right away if:  You are bleeding anywhere on your body.  You have blood in your spit, pee, or poop. Summary  Thrombocytopenia means that you have a low number of platelets in your blood.  Platelets are needed for blood clotting.  Symptoms of this condition include bleeding that is not normal, and bruising.  Take care not to cut or burn yourself. This information is not intended to replace advice given to you by your health care provider. Make sure you discuss any questions you have with your health care provider. Document Revised: 10/10/2017 Document Reviewed: 10/10/2017 Elsevier Patient Education  2020 [employer] Inc.  

## 2019-09-09 ENCOUNTER — Other Ambulatory Visit: Payer: Self-pay | Admitting: Hematology & Oncology

## 2019-09-09 LAB — BPAM PLATELET PHERESIS
Blood Product Expiration Date: 202108192359
ISSUE DATE / TIME: 202108170726
Unit Type and Rh: 6200

## 2019-09-09 LAB — PREPARE PLATELET PHERESIS: Unit division: 0

## 2019-09-11 DIAGNOSIS — R350 Frequency of micturition: Secondary | ICD-10-CM | POA: Diagnosis not present

## 2019-09-14 ENCOUNTER — Telehealth: Payer: Self-pay | Admitting: Internal Medicine

## 2019-09-14 ENCOUNTER — Telehealth: Payer: Self-pay | Admitting: *Deleted

## 2019-09-14 ENCOUNTER — Inpatient Hospital Stay: Payer: PPO

## 2019-09-14 ENCOUNTER — Other Ambulatory Visit: Payer: Self-pay | Admitting: *Deleted

## 2019-09-14 ENCOUNTER — Telehealth: Payer: Self-pay | Admitting: Pharmacist

## 2019-09-14 ENCOUNTER — Other Ambulatory Visit: Payer: Self-pay | Admitting: Family

## 2019-09-14 ENCOUNTER — Other Ambulatory Visit: Payer: Self-pay

## 2019-09-14 DIAGNOSIS — Z5111 Encounter for antineoplastic chemotherapy: Secondary | ICD-10-CM | POA: Diagnosis not present

## 2019-09-14 DIAGNOSIS — C9000 Multiple myeloma not having achieved remission: Secondary | ICD-10-CM

## 2019-09-14 DIAGNOSIS — D696 Thrombocytopenia, unspecified: Secondary | ICD-10-CM

## 2019-09-14 LAB — CBC WITH DIFFERENTIAL (CANCER CENTER ONLY)
Abs Immature Granulocytes: 0.03 10*3/uL (ref 0.00–0.07)
Basophils Absolute: 0 10*3/uL (ref 0.0–0.1)
Basophils Relative: 1 %
Eosinophils Absolute: 0 10*3/uL (ref 0.0–0.5)
Eosinophils Relative: 2 %
HCT: 20.9 % — ABNORMAL LOW (ref 39.0–52.0)
Hemoglobin: 7 g/dL — ABNORMAL LOW (ref 13.0–17.0)
Immature Granulocytes: 2 %
Lymphocytes Relative: 11 %
Lymphs Abs: 0.2 10*3/uL — ABNORMAL LOW (ref 0.7–4.0)
MCH: 31.8 pg (ref 26.0–34.0)
MCHC: 33.5 g/dL (ref 30.0–36.0)
MCV: 95 fL (ref 80.0–100.0)
Monocytes Absolute: 0.2 10*3/uL (ref 0.1–1.0)
Monocytes Relative: 11 %
Neutro Abs: 1.4 10*3/uL — ABNORMAL LOW (ref 1.7–7.7)
Neutrophils Relative %: 73 %
Platelet Count: 10 10*3/uL — ABNORMAL LOW (ref 150–400)
RBC: 2.2 MIL/uL — ABNORMAL LOW (ref 4.22–5.81)
RDW: 16.3 % — ABNORMAL HIGH (ref 11.5–15.5)
WBC Count: 2 10*3/uL — ABNORMAL LOW (ref 4.0–10.5)
nRBC: 0 % (ref 0.0–0.2)

## 2019-09-14 LAB — BASIC METABOLIC PANEL - CANCER CENTER ONLY
Anion gap: 8 (ref 5–15)
BUN: 17 mg/dL (ref 8–23)
CO2: 28 mmol/L (ref 22–32)
Calcium: 9.1 mg/dL (ref 8.9–10.3)
Chloride: 102 mmol/L (ref 98–111)
Creatinine: 1.04 mg/dL (ref 0.61–1.24)
GFR, Est AFR Am: >60 mL/min (ref >60)
GFR, Estimated: >60 mL/min (ref >60)
Glucose, Bld: 101 mg/dL — ABNORMAL HIGH (ref 70–99)
Potassium: 3.9 mmol/L (ref 3.5–5.1)
Sodium: 138 mmol/L (ref 135–145)

## 2019-09-14 LAB — PREPARE RBC (CROSSMATCH)

## 2019-09-14 LAB — SAMPLE TO BLOOD BANK

## 2019-09-14 MED ORDER — SODIUM CHLORIDE 0.9% IV SOLUTION
250.0000 mL | Freq: Once | INTRAVENOUS | Status: DC
  Filled 2019-09-14: qty 250

## 2019-09-14 MED ORDER — DRONABINOL 2.5 MG PO CAPS: ORAL_CAPSULE | ORAL | 0 refills | Status: AC

## 2019-09-14 MED ORDER — MIRTAZAPINE 7.5 MG PO TABS: 7.5000 mg | ORAL_TABLET | Freq: Every day | ORAL | 0 refills | Status: DC

## 2019-09-14 MED ORDER — TRAMADOL HCL 50 MG PO TABS
50.0000 mg | ORAL_TABLET | Freq: Four times a day (QID) | ORAL | 0 refills | Status: AC | PRN
Start: 2019-09-14 — End: ?

## 2019-09-14 MED ORDER — FAMCICLOVIR 250 MG PO TABS: 250.0000 mg | ORAL_TABLET | Freq: Every day | ORAL | 0 refills | Status: AC

## 2019-09-14 MED ORDER — SODIUM CHLORIDE 0.9% FLUSH
10.0000 mL | INTRAVENOUS | Status: AC | PRN
  Administered 2019-09-14: 10 mL
  Filled 2019-09-14: qty 10

## 2019-09-14 MED ORDER — HEPARIN SOD (PORK) LOCK FLUSH 100 UNIT/ML IV SOLN
500.0000 [IU] | Freq: Every day | INTRAVENOUS | Status: AC | PRN
  Administered 2019-09-14: 500 [IU]
  Filled 2019-09-14: qty 5

## 2019-09-14 MED ORDER — METHOCARBAMOL 500 MG PO TABS: 500.0000 mg | ORAL_TABLET | Freq: Three times a day (TID) | ORAL | 0 refills | Status: AC | PRN

## 2019-09-14 NOTE — Patient Instructions (Signed)

## 2019-09-14 NOTE — Telephone Encounter (Signed)
Rx sent 

## 2019-09-14 NOTE — Telephone Encounter (Signed)
Dr. Marin Olp notified of hgb-7.0 and platelet count-10.  Order received for pt to get one unit of platelets today and one unit of PRBC's tomorrow per Dr. Marin Olp.

## 2019-09-14 NOTE — Addendum Note (Signed)
Addended byDamita Dunnings D on: 09/14/2019 02:45 PM   Modules accepted: Orders

## 2019-09-14 NOTE — Telephone Encounter (Signed)
Patient would like a 3 months supply  Medication: methocarbamol (ROBAXIN) 500 MG tablet [[id number]]       Has the patient contacted their pharmacy?  (If no, request that the patient contact the pharmacy for the refill.) (If yes, when and what did the pharmacy advise?)     Preferred Pharmacy (with phone number or street name): [facility], [place] - [address]  [place], [place] [address]  Phone:  [phone] Fax:  [phone]     Agent: Please be advised that RX refills may take up to 3 business days. We ask that you follow-up with your pharmacy.

## 2019-09-14 NOTE — Progress Notes (Addendum)
Chronic Care Management Pharmacy Assistant   Name: Gene Hill  MRN: [id number] DOB: November 27, 1940  Reason for Encounter: Disease State  Patient Questions:  1.  Have you seen any other providers since your last visit? Yes  2.  Any changes in your medicines or health? Yes  PCP : Colon Branch, MD   Their chronic conditions include: HTN, CAD, HLD, Barrett's Esophagus, Arthritis/Pain, BPH, Multiple Myeloma  Office Visits: 03-31-2019 (Oncology) Patient presented in the office with Dr. Marin Olp for follow-up of IgA kappa myeloma. Medication changes to chemo regimen.; carfilzomib to be given three weeks on and one week off.  Consults: 03-31-2019 (PCP) Patient presented in the office with Dr. Larose Kells for annual physical exam. There were no medication changes.  Allergies:  No Known Allergies  Medications: Outpatient Encounter Medications as of 09/14/2019  Medication Sig  . acetaminophen (TYLENOL) 500 MG tablet Take 1,000 mg by mouth 3 (three) times daily as needed.  Marland Kitchen aspirin 325 MG tablet Take 325 mg by mouth daily.  Marland Kitchen b complex vitamins tablet Take 1 tablet by mouth daily.  . calcium carbonate (OSCAL) 1500 (600 Ca) MG TABS tablet Take 600 mg of elemental calcium by mouth daily with breakfast.   . Cyanocobalamin (VITAMIN B-12 PO) Take 5,000 mcg by mouth daily.   Marland Kitchen dronabinol (MARINOL) 2.5 MG capsule TAKE 1 CAPSULE(2.5 MG) BY MOUTH TWICE DAILY BEFORE A MEAL  . famciclovir (FAMVIR) 250 MG tablet Take 1 tablet (250 mg total) by mouth daily.  Marland Kitchen lidocaine-prilocaine (EMLA) cream Apply to affected area once  . LORazepam (ATIVAN) 0.5 MG tablet Take 1 tablet (0.5 mg total) by mouth every 6 (six) hours as needed (Nausea or vomiting).  . methocarbamol (ROBAXIN) 500 MG tablet Take 1 tablet (500 mg total) by mouth 3 (three) times daily as needed for muscle spasms.  . metoprolol tartrate (LOPRESSOR) 50 MG tablet Take 1 tablet (50 mg total) by mouth 2 (two) times daily.  . mirtazapine (REMERON) 7.5 MG  tablet Take 1 tablet (7.5 mg total) by mouth at bedtime.  . Multiple Vitamin (MULTIVITAMIN) tablet Take 1 tablet by mouth daily.  . Omega-3 Fatty Acids (FISH OIL) 1200 MG CAPS Take 1,200 mg by mouth daily.  . ondansetron (ZOFRAN) 8 MG tablet Take 1 tablet (8 mg total) by mouth 2 (two) times daily as needed for refractory nausea / vomiting. Start on day 3 after chemotherapy.  Marland Kitchen oxybutynin (DITROPAN-XL) 10 MG 24 hr tablet Take 10 mg by mouth daily.  . pantoprazole (PROTONIX) 40 MG tablet Take 1 tablet (40 mg total) by mouth at bedtime.  . Probiotic Product (PROBIOTIC PO) Take 1 tablet by mouth daily.   . prochlorperazine (COMPAZINE) 10 MG tablet Take 1 tablet (10 mg total) by mouth every 6 (six) hours as needed (Nausea or vomiting).  . tamsulosin (FLOMAX) 0.4 MG CAPS capsule Take 0.4 mg by mouth daily.  . traMADol (ULTRAM) 50 MG tablet Take 1 tablet (50 mg total) by mouth every 6 (six) hours as needed. 90 day supply please   Facility-Administered Encounter Medications as of 09/14/2019  Medication  . daratumumab (DARZALEX) 1,600 mg in sodium chloride 0.9 % 420 mL (3.2 mg/mL) chemo infusion  . sodium chloride flush (NS) 0.9 % injection 10 mL  . sodium chloride flush (NS) 0.9 % injection 10 mL    Current Diagnosis: Patient Active Problem List   Diagnosis Date Noted  . 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 Degenerative arthritis of left  shoulder region 11/19/2018  . 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 Primary localized osteoarthritis of right knee 12/23/2015  . Acute left-sided thoracic back pain   . Atherosclerosis of native coronary artery of native heart without angina pectoris   . PCP NOTES >>>>>>>>>>>>>>>>>>>>>>>> 08/22/2015  . Arthritis-- aches, pain 02/05/2014  . Dizziness 02/05/2014  . 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 Annual physical exam 08/14/2010  . 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  Multiple myeloma not having achieved remission ([place]) 04/15/2009  . CAROTID BRUIT 04/14/2009  . 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 DIVERTICULOSIS, COLON 08/13/2008  . Backache 07/22/2006  . FATIGUE 07/22/2006  . Hyperlipidemia 05/03/2006  . Essential hypertension 05/03/2006  . Coronary atherosclerosis 05/03/2006  . COLONOSCOPY WITH BIOPSY, HX OF 09/22/2002    Goals Addressed   None    Reviewed chart prior to disease state call. Spoke with patient regarding BP  Recent Office Vitals: BP Readings from Last 3 Encounters:  09/15/19 123/71  09/14/19 132/71  09/14/19 133/76   Pulse Readings from Last 3 Encounters:  09/15/19 64  09/14/19 67  09/14/19 70    Wt Readings from Last 3 Encounters:  09/07/19 194 lb (88 kg)  08/24/19 196 lb (88.9 kg)  08/17/19 197 lb 6.4 oz (89.5 kg)     Kidney Function Lab Results  Component Value Date/Time   CREATININE 1.04 09/14/2019 09:40 AM   CREATININE 0.99 09/07/2019 10:20 AM   CREATININE 1.0 01/21/2017 07:41 AM   CREATININE 0.8 01/14/2017 08:25 AM   CREATININE 0.9 06/14/2016 12:46 PM   CREATININE 1.0 03/30/2016 09:37 AM   GFRNONAA >60 09/14/2019 09:40 AM   GFRAA >60 09/14/2019 09:40 AM    BMP Latest Ref Rng & Units 09/14/2019 09/07/2019 08/31/2019  Glucose 70 - 99 mg/dL 101(H) 105(H) 102(H)  BUN 8 - 23 mg/dL 17 18 24(H)  Creatinine 0.61 - 1.24 mg/dL 1.04 0.99 0.91  Sodium 135 - 145 mmol/L 138 139 137  Potassium 3.5 - 5.1 mmol/L 3.9 4.0 4.1  Chloride 98 - 111 mmol/L 102 102 102  CO2 22 - 32 mmol/L 28 28 27   Calcium 8.9 - 10.3 mg/dL 9.1 9.7 9.5    . Current antihypertensive regimen:   metoprolol tartrate 100mg  1/2 tab BID . How often are you checking your Blood Pressure? 1-2x per week. Patient has his blood pressure checked several times a week at doctor appointments. . Current BP readings from office visits: 120/63 on 8-24, 132/71 on 8-23, 133/76 on 8-23, 134/66 on 8-17, 121/86 on 8-16. . What recent interventions/DTPs have been made by  any provider to improve Blood Pressure control since last CPP Visit: Yes . Any recent hospitalizations or ED visits since last visit with CPP? No . What diet changes have been made to improve Blood Pressure Control?  o Patient's wife states they have fresh vegetable from their garden. They do not have much beef, but if there is meat with meals it's grilled. . What exercise is being done to improve your Blood Pressure Control?  o Patient does not get much exercise due to being unsteady on his feet. He does take small walks around the house and goes up and down the stairs with help.  Spoke with patient's wife, Bartolo Darter in regards to Mr. Creason's health. She stated the patient's mental and physical health have improved since the Spring.  Adherence Review: Is the patient currently on ACE/ARB medication? Yes Does the patient have >  5 day gap between last estimated fill dates? No CPP please confirm.   Follow-Up:  Scheduled Follow-Up With Clinical Pharmacist please schedule both Mr. And Mrs [facility] follow-up appointments for Wednesday September 29th at [place], [place] Pharmacist Assistant [phone]  Reviewed by: De Blanch, PharmD Clinical Pharmacist [facility] [phone]

## 2019-09-14 NOTE — Patient Instructions (Signed)
Implanted Port Insertion, Care After °This sheet gives you information about how to care for yourself after your procedure. Your health care provider may also give you more specific instructions. If you have problems or questions, contact your health care provider. °What can I expect after the procedure? °After the procedure, it is common to have: °· Discomfort at the port insertion site. °· Bruising on the skin over the port. This should improve over 3-4 days. °Follow these instructions at home: °Port care °· After your port is placed, you will get a manufacturer's information card. The card has information about your port. Keep this card with you at all times. °· Take care of the port as told by your health care provider. Ask your health care provider if you or a family member can get training for taking care of the port at home. A home health care nurse may also take care of the port. °· Make sure to remember what type of port you have. °Incision care ° °  ° °· Follow instructions from your health care provider about how to take care of your port insertion site. Make sure you: °? Wash your hands with soap and water before and after you change your bandage (dressing). If soap and water are not available, use hand sanitizer. °? Change your dressing as told by your health care provider. °? Leave stitches (sutures), skin glue, or adhesive strips in place. These skin closures may need to stay in place for 2 weeks or longer. If adhesive strip edges start to loosen and curl up, you may trim the loose edges. Do not remove adhesive strips completely unless your health care provider tells you to do that. °· Check your port insertion site every day for signs of infection. Check for: °? Redness, swelling, or pain. °? Fluid or blood. °? Warmth. °? Pus or a bad smell. °Activity °· Return to your normal activities as told by your health care provider. Ask your health care provider what activities are safe for you. °· Do not  lift anything that is heavier than 10 lb (4.5 kg), or the limit that you are told, until your health care provider says that it is safe. °General instructions °· Take over-the-counter and prescription medicines only as told by your health care provider. °· Do not take baths, swim, or use a hot tub until your health care provider approves. Ask your health care provider if you may take showers. You may only be allowed to take sponge baths. °· Do not drive for 24 hours if you were given a sedative during your procedure. °· Wear a medical alert bracelet in case of an emergency. This will tell any health care providers that you have a port. °· Keep all follow-up visits as told by your health care provider. This is important. °Contact a health care provider if: °· You cannot flush your port with saline as directed, or you cannot draw blood from the port. °· You have a fever or chills. °· You have redness, swelling, or pain around your port insertion site. °· You have fluid or blood coming from your port insertion site. °· Your port insertion site feels warm to the touch. °· You have pus or a bad smell coming from the port insertion site. °Get help right away if: °· You have chest pain or shortness of breath. °· You have bleeding from your port that you cannot control. °Summary °· Take care of the port as told by your health   care provider. Keep the manufacturer's information card with you at all times. °· Change your dressing as told by your health care provider. °· Contact a health care provider if you have a fever or chills or if you have redness, swelling, or pain around your port insertion site. °· Keep all follow-up visits as told by your health care provider. °This information is not intended to replace advice given to you by your health care provider. Make sure you discuss any questions you have with your health care provider. °Document Revised: 08/06/2017 Document Reviewed: 08/06/2017 °Elsevier Patient Education ©  2020 Elsevier Inc. ° °

## 2019-09-15 ENCOUNTER — Inpatient Hospital Stay: Payer: PPO

## 2019-09-15 ENCOUNTER — Other Ambulatory Visit: Payer: Self-pay | Admitting: Hematology & Oncology

## 2019-09-15 ENCOUNTER — Telehealth: Payer: PPO

## 2019-09-15 DIAGNOSIS — Z5111 Encounter for antineoplastic chemotherapy: Secondary | ICD-10-CM | POA: Diagnosis not present

## 2019-09-15 DIAGNOSIS — C9000 Multiple myeloma not having achieved remission: Secondary | ICD-10-CM

## 2019-09-15 DIAGNOSIS — D696 Thrombocytopenia, unspecified: Secondary | ICD-10-CM

## 2019-09-15 LAB — PREPARE PLATELET PHERESIS: Unit division: 0

## 2019-09-15 LAB — BPAM PLATELET PHERESIS
Blood Product Expiration Date: 202108232359
ISSUE DATE / TIME: 202108231059
Unit Type and Rh: 5100

## 2019-09-15 MED ORDER — ACETAMINOPHEN 325 MG PO TABS: 650.0000 mg | ORAL_TABLET | Freq: Once | ORAL | Status: DC

## 2019-09-15 MED ORDER — SODIUM CHLORIDE 0.9% IV SOLUTION
250.0000 mL | Freq: Once | INTRAVENOUS | Status: AC
  Administered 2019-09-15: 250 mL via INTRAVENOUS
  Filled 2019-09-15: qty 250

## 2019-09-15 MED ORDER — DIPHENHYDRAMINE HCL 25 MG PO CAPS: 25.0000 mg | ORAL_CAPSULE | Freq: Once | ORAL | Status: DC

## 2019-09-15 NOTE — Patient Instructions (Signed)

## 2019-09-16 DIAGNOSIS — C9000 Multiple myeloma not having achieved remission: Secondary | ICD-10-CM | POA: Diagnosis not present

## 2019-09-16 DIAGNOSIS — H0288A Meibomian gland dysfunction right eye, upper and lower eyelids: Secondary | ICD-10-CM | POA: Diagnosis not present

## 2019-09-16 DIAGNOSIS — H35373 Puckering of macula, bilateral: Secondary | ICD-10-CM | POA: Diagnosis not present

## 2019-09-16 DIAGNOSIS — H2512 Age-related nuclear cataract, left eye: Secondary | ICD-10-CM | POA: Diagnosis not present

## 2019-09-16 DIAGNOSIS — H25811 Combined forms of age-related cataract, right eye: Secondary | ICD-10-CM | POA: Diagnosis not present

## 2019-09-16 DIAGNOSIS — H3563 Retinal hemorrhage, bilateral: Secondary | ICD-10-CM | POA: Diagnosis not present

## 2019-09-16 DIAGNOSIS — H0288B Meibomian gland dysfunction left eye, upper and lower eyelids: Secondary | ICD-10-CM | POA: Diagnosis not present

## 2019-09-16 DIAGNOSIS — H11041 Peripheral pterygium, stationary, right eye: Secondary | ICD-10-CM | POA: Diagnosis not present

## 2019-09-16 DIAGNOSIS — H04123 Dry eye syndrome of bilateral lacrimal glands: Secondary | ICD-10-CM | POA: Diagnosis not present

## 2019-09-16 LAB — BPAM RBC
Blood Product Expiration Date: 202109152359
ISSUE DATE / TIME: 202108240809
Unit Type and Rh: 6200

## 2019-09-16 LAB — TYPE AND SCREEN
ABO/RH(D): A POS
Antibody Screen: NEGATIVE
Unit division: 0

## 2019-09-17 ENCOUNTER — Other Ambulatory Visit: Payer: Self-pay | Admitting: Physical Medicine and Rehabilitation

## 2019-09-17 DIAGNOSIS — M545 Low back pain: Secondary | ICD-10-CM | POA: Diagnosis not present

## 2019-09-17 DIAGNOSIS — R29898 Other symptoms and signs involving the musculoskeletal system: Secondary | ICD-10-CM

## 2019-09-17 DIAGNOSIS — R202 Paresthesia of skin: Secondary | ICD-10-CM

## 2019-09-17 DIAGNOSIS — M542 Cervicalgia: Secondary | ICD-10-CM | POA: Diagnosis not present

## 2019-09-18 DIAGNOSIS — R350 Frequency of micturition: Secondary | ICD-10-CM | POA: Diagnosis not present

## 2019-09-21 ENCOUNTER — Encounter: Payer: Self-pay | Admitting: Hematology & Oncology

## 2019-09-21 ENCOUNTER — Inpatient Hospital Stay: Payer: PPO

## 2019-09-21 ENCOUNTER — Other Ambulatory Visit: Payer: Self-pay | Admitting: Family

## 2019-09-21 ENCOUNTER — Telehealth: Payer: Self-pay | Admitting: *Deleted

## 2019-09-21 ENCOUNTER — Inpatient Hospital Stay (HOSPITAL_BASED_OUTPATIENT_CLINIC_OR_DEPARTMENT_OTHER): Payer: PPO | Admitting: Hematology & Oncology

## 2019-09-21 ENCOUNTER — Other Ambulatory Visit: Payer: Self-pay

## 2019-09-21 ENCOUNTER — Other Ambulatory Visit: Payer: Self-pay | Admitting: *Deleted

## 2019-09-21 VITALS — BP 150/73 | HR 70 | Temp 98.4°F | Resp 18

## 2019-09-21 VITALS — BP 150/73 | HR 70 | Temp 98.4°F | Resp 18 | Wt 191.0 lb

## 2019-09-21 DIAGNOSIS — C9000 Multiple myeloma not having achieved remission: Secondary | ICD-10-CM | POA: Diagnosis not present

## 2019-09-21 DIAGNOSIS — Z95828 Presence of other vascular implants and grafts: Secondary | ICD-10-CM

## 2019-09-21 DIAGNOSIS — D649 Anemia, unspecified: Secondary | ICD-10-CM

## 2019-09-21 DIAGNOSIS — Z5111 Encounter for antineoplastic chemotherapy: Secondary | ICD-10-CM | POA: Diagnosis not present

## 2019-09-21 LAB — CMP (CANCER CENTER ONLY)
ALT: 13 U/L (ref 0–44)
AST: 19 U/L (ref 15–41)
Albumin: 4.2 g/dL (ref 3.5–5.0)
Alkaline Phosphatase: 49 U/L (ref 38–126)
Anion gap: 10 (ref 5–15)
BUN: 16 mg/dL (ref 8–23)
CO2: 28 mmol/L (ref 22–32)
Calcium: 10 mg/dL (ref 8.9–10.3)
Chloride: 101 mmol/L (ref 98–111)
Creatinine: 0.96 mg/dL (ref 0.61–1.24)
GFR, Est AFR Am: >60 mL/min (ref >60)
GFR, Estimated: >60 mL/min (ref >60)
Glucose, Bld: 95 mg/dL (ref 70–99)
Potassium: 3.9 mmol/L (ref 3.5–5.1)
Sodium: 139 mmol/L (ref 135–145)
Total Bilirubin: 0.6 mg/dL (ref 0.3–1.2)
Total Protein: 7.6 g/dL (ref 6.5–8.1)

## 2019-09-21 LAB — CBC WITH DIFFERENTIAL (CANCER CENTER ONLY)
Abs Immature Granulocytes: 0.13 10*3/uL — ABNORMAL HIGH (ref 0.00–0.07)
Basophils Absolute: 0 10*3/uL (ref 0.0–0.1)
Basophils Relative: 0 %
Eosinophils Absolute: 0 10*3/uL (ref 0.0–0.5)
Eosinophils Relative: 1 %
HCT: 22.8 % — ABNORMAL LOW (ref 39.0–52.0)
Hemoglobin: 7.7 g/dL — ABNORMAL LOW (ref 13.0–17.0)
Immature Granulocytes: 5 %
Lymphocytes Relative: 22 %
Lymphs Abs: 0.6 10*3/uL — ABNORMAL LOW (ref 0.7–4.0)
MCH: 32.6 pg (ref 26.0–34.0)
MCHC: 33.8 g/dL (ref 30.0–36.0)
MCV: 96.6 fL (ref 80.0–100.0)
Monocytes Absolute: 0.5 10*3/uL (ref 0.1–1.0)
Monocytes Relative: 18 %
Neutro Abs: 1.4 10*3/uL — ABNORMAL LOW (ref 1.7–7.7)
Neutrophils Relative %: 54 %
Platelet Count: 21 10*3/uL — ABNORMAL LOW (ref 150–400)
RBC: 2.36 MIL/uL — ABNORMAL LOW (ref 4.22–5.81)
RDW: 18.3 % — ABNORMAL HIGH (ref 11.5–15.5)
WBC Count: 2.6 10*3/uL — ABNORMAL LOW (ref 4.0–10.5)
nRBC: 0 % (ref 0.0–0.2)

## 2019-09-21 LAB — SAMPLE TO BLOOD BANK

## 2019-09-21 LAB — RETICULOCYTES
Immature Retic Fract: 24.5 % — ABNORMAL HIGH (ref 2.3–15.9)
RBC.: 2.38 MIL/uL — ABNORMAL LOW (ref 4.22–5.81)
Retic Count, Absolute: 48.8 10*3/uL (ref 19.0–186.0)
Retic Ct Pct: 2.1 % (ref 0.4–3.1)

## 2019-09-21 LAB — PREPARE RBC (CROSSMATCH)

## 2019-09-21 LAB — LACTATE DEHYDROGENASE: LDH: 290 U/L — ABNORMAL HIGH (ref 98–192)

## 2019-09-21 MED ORDER — SODIUM CHLORIDE 0.9% FLUSH
10.0000 mL | Freq: Once | INTRAVENOUS | Status: AC
  Administered 2019-09-21: 10 mL via INTRAVENOUS
  Filled 2019-09-21: qty 10

## 2019-09-21 MED ORDER — HEPARIN SOD (PORK) LOCK FLUSH 100 UNIT/ML IV SOLN
500.0000 [IU] | Freq: Once | INTRAVENOUS | Status: AC
  Administered 2019-09-21: 500 [IU] via INTRAVENOUS
  Filled 2019-09-21: qty 5

## 2019-09-21 NOTE — Telephone Encounter (Signed)
Dr. Marin Olp notified of HGB-7.7, WBC-2.6, ANC-1.4 and platelet count-21.  No new orders received at this time.

## 2019-09-21 NOTE — Patient Instructions (Signed)

## 2019-09-21 NOTE — Addendum Note (Signed)
Addended by: Lucile Crater on: 09/21/2019 11:19 AM   Modules accepted: Orders

## 2019-09-21 NOTE — Progress Notes (Signed)
Hematology and Oncology Follow Up Visit  Gene Hill [id number] 10-27-1940 79 y.o. 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  I think that he actually did pretty well with the Melflufen.  His blood counts were down which is expected.  However, I really do not get sick.  He does feel tired.  His hemoglobin is 7.7 today.  I will have to give him 1 unit of blood.  I think this will help him.  I think it is still a little bit too early to know if the Melflufen has helped.  We will see what his light chains are, as this is really where the recurrence has been.  He is not hurting.  He has had no problems with his appetite.  He has been taking a lot of ice cream and candy.  He has had no cough.  There has been no fever.  He has had no leg swelling.  He has a little bit of unsteadiness when he gets up.  Overall, I would say his performance status is probably ECOG 1.    Medications:  Allergies as of 09/21/2019   No Known Allergies     Medication List       Accurate as of September 21, 2019  9:52 AM. If you have any questions, ask your nurse or doctor.        acetaminophen 500 MG tablet Commonly known as: TYLENOL Take 1,000 mg by mouth 3 (three) times daily as needed.   aspirin 325 MG tablet Take 325 mg by mouth daily.   b complex vitamins tablet Take 1 tablet by mouth daily.   calcium carbonate 1500 (600 Ca) MG  Tabs tablet Commonly known as: OSCAL Take 600 mg of elemental calcium by mouth daily with breakfast.   dronabinol 2.5 MG capsule Commonly known as: MARINOL TAKE 1 CAPSULE(2.5 MG) BY MOUTH TWICE DAILY BEFORE A MEAL   famciclovir 250 MG tablet Commonly known as: FAMVIR Take 1 tablet (250 mg total) by mouth daily.   Fish Oil 1200 MG Caps Take 1,200 mg by mouth daily.   lidocaine-prilocaine cream Commonly known as: EMLA Apply to affected area once   LORazepam 0.5 MG tablet Commonly known as: Ativan Take 1 tablet (0.5 mg total) by mouth every 6 (six) hours as needed (Nausea or vomiting).   methocarbamol 500 MG tablet Commonly known as: ROBAXIN Take 1 tablet (500 mg total) by mouth 3 (three) times daily as needed for muscle spasms.   metoprolol tartrate 50 MG tablet Commonly known as: LOPRESSOR Take 1 tablet (50 mg total) by mouth 2 (two) times daily.   mirtazapine 7.5 MG tablet Commonly known as: REMERON Take 1 tablet (7.5 mg total) by mouth at bedtime.   multivitamin tablet Take 1 tablet by mouth daily.   ondansetron 8 MG tablet Commonly known as: Zofran Take 1 tablet (8 mg  total) by mouth 2 (two) times daily as needed for refractory nausea / vomiting. Start on day 3 after chemotherapy.   oxybutynin 10 MG 24 hr tablet Commonly known as: DITROPAN-XL Take 10 mg by mouth daily.   pantoprazole 40 MG tablet Commonly known as: PROTONIX Take 1 tablet (40 mg total) by mouth at bedtime.   PROBIOTIC PO Take 1 tablet by mouth daily.   prochlorperazine 10 MG tablet Commonly known as: COMPAZINE Take 1 tablet (10 mg total) by mouth every 6 (six) hours as needed (Nausea or vomiting).   tamsulosin 0.4 MG Caps capsule Commonly known as: FLOMAX Take 0.4 mg by mouth daily.   traMADol 50 MG tablet Commonly known as: ULTRAM Take 1 tablet (50 mg total) by mouth every 6 (six) hours as needed. 90 day supply please   VITAMIN B-12 PO Take 5,000 mcg by mouth daily.        Allergies: No Known Allergies  Past Medical History, Surgical history, Social history, and Family History were reviewed and updated.  Review of Systems: Review of Systems  Constitutional: Positive for malaise/fatigue.  HENT: Negative.   Eyes: Negative.   Respiratory: Positive for shortness of breath.   Cardiovascular: Negative.   Gastrointestinal: Positive for nausea.  Genitourinary: Negative.   Musculoskeletal: Positive for back pain and joint pain.  Skin: Negative.   Neurological: Positive for weakness.  Endo/Heme/Allergies: Bruises/bleeds easily.  Psychiatric/Behavioral: Negative.      Physical Exam:  vitals were not taken for this visit.   Wt Readings from Last 3 Encounters:  09/07/19 194 lb (88 kg)  08/24/19 196 lb (88.9 kg)  08/17/19 197 lb 6.4 oz (89.5 kg)    His vital signs are temperature of 97.8.  Pulse 71.  Blood pressure 123/65.  Weight is 198 pounds.  Physical Exam Vitals reviewed.  HENT:     Head: Normocephalic and atraumatic.  Eyes:     Pupils: Pupils are equal, round, and reactive to light.  Cardiovascular:     Rate and Rhythm: Normal rate and regular rhythm.     Heart sounds: Normal heart sounds.  Pulmonary:     Effort: Pulmonary effort is normal.     Breath sounds: Normal breath sounds.  Abdominal:     General: Bowel sounds are normal.     Palpations: Abdomen is soft.  Musculoskeletal:        General: No tenderness or deformity. Normal range of motion.     Cervical back: Normal range of motion.  Lymphadenopathy:     Cervical: No cervical adenopathy.  Skin:    General: Skin is warm and dry.     Findings: No erythema or rash.  Neurological:     Mental Status: He is alert and oriented to person, place, and time.  Psychiatric:        Behavior: Behavior normal.        Thought Content: Thought content normal.        Judgment: Judgment normal.      Lab Results  Component Value Date   WBC 2.0 (L) 09/14/2019   HGB 7.0 (L) 09/14/2019    HCT 20.9 (L) 09/14/2019   MCV 95.0 09/14/2019   PLT 10 (L) 09/14/2019   Lab Results  Component Value Date   FERRITIN 468 (H) 07/21/2019   IRON 195 (H) 07/21/2019   TIBC 235 07/21/2019   UIBC 40 (L) 07/21/2019   IRONPCTSAT 83 (H) 07/21/2019   Lab Results  Component Value Date   RETICCTPCT 2.1  02/25/2019   RBC 2.20 (L) 09/14/2019   Lab Results  Component Value Date   KPAFRELGTCHN 1,575.6 (H) 08/17/2019   LAMBDASER 6.7 08/17/2019   KAPLAMBRATIO 235.16 (H) 08/17/2019   Lab Results  Component Value Date   IGGSERUM 103 (L) 08/17/2019   IGA 6,233 (H) 08/17/2019   IGMSERUM <5 (L) 08/17/2019   Lab Results  Component Value Date   TOTALPROTELP 7.7 08/17/2019   ALBUMINELP 3.7 08/17/2019   A1GS 0.2 08/17/2019   A2GS 0.8 08/17/2019   BETS 2.5 (H) 08/17/2019   BETA2SER 2.0 (H) 12/08/2014   GAMS 0.6 08/17/2019   MSPIKE 1.1 (H) 08/17/2019   SPEI Comment 06/30/2019     Chemistry      Component Value Date/Time   NA 138 09/14/2019 0940   NA 141 01/21/2017 0741   NA 139 06/14/2016 1246   K 3.9 09/14/2019 0940   K 4.0 01/21/2017 0741   K 3.8 06/14/2016 1246   CL 102 09/14/2019 0940   CL 103 01/21/2017 0741   CO2 28 09/14/2019 0940   CO2 30 01/21/2017 0741   CO2 24 06/14/2016 1246   BUN 17 09/14/2019 0940   BUN 20 01/21/2017 0741   BUN 15.0 06/14/2016 1246   CREATININE 1.04 09/14/2019 0940   CREATININE 1.0 01/21/2017 0741   CREATININE 0.9 06/14/2016 1246      Component Value Date/Time   CALCIUM 9.1 09/14/2019 0940   CALCIUM 9.0 01/21/2017 0741   CALCIUM 9.6 06/14/2016 1246   ALKPHOS 50 09/07/2019 1020   ALKPHOS 37 01/21/2017 0741   ALKPHOS 54 06/14/2016 1246   AST 15 09/07/2019 1020   AST 25 06/14/2016 1246   ALT 11 09/07/2019 1020   ALT 29 01/21/2017 0741   ALT 21 06/14/2016 1246   BILITOT 0.5 09/07/2019 1020   BILITOT 0.37 06/14/2016 1246       Impression and Plan: Mr. Caples is a very pleasant 79 yo caucasian gentleman with history of IgA kappa myeloma,  high risk cytogenetics with a 17p- abnormality.   Actually, I am surprised that he is done so well despite having the 17p-abnormality.  I do think that we have to hold the Melflufen for today.  I do still think that he is able to take treatment given his blood counts and the fact that he does not feel all that well.  We will hold his treatments for 1 week.  We may have to make a dosage adjustment on his treatments.  We will see about transfusing him tomorrow, 31 August.  We will plan to see him back next week.  We will see how he is feeling hopefully go with his second cycle of Melflufen.    [name], MD 8/30/20219:52 AM

## 2019-09-22 ENCOUNTER — Inpatient Hospital Stay: Payer: PPO

## 2019-09-22 DIAGNOSIS — Z5111 Encounter for antineoplastic chemotherapy: Secondary | ICD-10-CM | POA: Diagnosis not present

## 2019-09-22 DIAGNOSIS — C9000 Multiple myeloma not having achieved remission: Secondary | ICD-10-CM

## 2019-09-22 DIAGNOSIS — D649 Anemia, unspecified: Secondary | ICD-10-CM

## 2019-09-22 LAB — IGG, IGA, IGM
IgA: 1809 mg/dL — ABNORMAL HIGH (ref 61–437)
IgG (Immunoglobin G), Serum: 89 mg/dL — ABNORMAL LOW (ref 603–1613)
IgM (Immunoglobulin M), Srm: <5 mg/dL — ABNORMAL LOW (ref 15–143)

## 2019-09-22 LAB — KAPPA/LAMBDA LIGHT CHAINS
Kappa free light chain: 1697.7 mg/L — ABNORMAL HIGH (ref 3.3–19.4)
Kappa, lambda light chain ratio: 166.44 — ABNORMAL HIGH (ref 0.26–1.65)
Lambda free light chains: 10.2 mg/L (ref 5.7–26.3)

## 2019-09-22 MED ORDER — SODIUM CHLORIDE 0.9% FLUSH
10.0000 mL | Freq: Once | INTRAVENOUS | Status: AC
  Administered 2019-09-22: 10 mL via INTRAVENOUS
  Filled 2019-09-22: qty 10

## 2019-09-22 MED ORDER — HEPARIN SOD (PORK) LOCK FLUSH 100 UNIT/ML IV SOLN
500.0000 [IU] | Freq: Once | INTRAVENOUS | Status: AC
  Administered 2019-09-22: 500 [IU] via INTRAVENOUS
  Filled 2019-09-22: qty 5

## 2019-09-22 MED ORDER — SODIUM CHLORIDE 0.9% IV SOLUTION
250.0000 mL | Freq: Once | INTRAVENOUS | Status: DC
  Filled 2019-09-22: qty 250

## 2019-09-22 NOTE — Patient Instructions (Signed)
Anemia  Anemia is a condition in which you do not have enough red blood cells or hemoglobin. Hemoglobin is a substance in red blood cells that carries oxygen. When you do not have enough red blood cells or hemoglobin (are anemic), your body cannot get enough oxygen and your organs may not work properly. As a result, you may feel very tired or have other problems. What are the causes? Common causes of anemia include:  Excessive bleeding. Anemia can be caused by excessive bleeding inside or outside the body, including bleeding from the intestine or from periods in women.  Poor nutrition.  Long-lasting (chronic) kidney, thyroid, and liver disease.  Bone marrow disorders.  Cancer and treatments for cancer.  HIV (human immunodeficiency virus) and AIDS (acquired immunodeficiency syndrome).  Treatments for HIV and AIDS.  Spleen problems.  Blood disorders.  Infections, medicines, and autoimmune disorders that destroy red blood cells. What are the signs or symptoms? Symptoms of this condition include:  Minor weakness.  Dizziness.  Headache.  Feeling heartbeats that are irregular or faster than normal (palpitations).  Shortness of breath, especially with exercise.  Paleness.  Cold sensitivity.  Indigestion.  Nausea.  Difficulty sleeping.  Difficulty concentrating. Symptoms may occur suddenly or develop slowly. If your anemia is mild, you may not have symptoms. How is this diagnosed? This condition is diagnosed based on:  Blood tests.  Your medical history.  A physical exam.  Bone marrow biopsy. Your health care provider may also check your stool (feces) for blood and may do additional testing to look for the cause of your bleeding. You may also have other tests, including:  Imaging tests, such as a CT scan or MRI.  Endoscopy.  Colonoscopy. How is this treated? Treatment for this condition depends on the cause. If you continue to lose a lot of blood, you may  need to be treated at a hospital. Treatment may include:  Taking supplements of iron, vitamin S31, or folic acid.  Taking a hormone medicine (erythropoietin) that can help to stimulate red blood cell growth.  Having a blood transfusion. This may be needed if you lose a lot of blood.  Making changes to your diet.  Having surgery to remove your spleen. Follow these instructions at home:  Take over-the-counter and prescription medicines only as told by your health care provider.  Take supplements only as told by your health care provider.  Follow any diet instructions that you were given.  Keep all follow-up visits as told by your health care provider. This is important. Contact a health care provider if:  You develop new bleeding anywhere in the body. Get help right away if:  You are very weak.  You are short of breath.  You have pain in your abdomen or chest.  You are dizzy or feel faint.  You have trouble concentrating.  You have bloody or black, tarry stools.  You vomit repeatedly or you vomit up blood. Summary  Anemia is a condition in which you do not have enough red blood cells or enough of a substance in your red blood cells that carries oxygen (hemoglobin).  Symptoms may occur suddenly or develop slowly.  If your anemia is mild, you may not have symptoms.  This condition is diagnosed with blood tests as well as a medical history and physical exam. Other tests may be needed.  Treatment for this condition depends on the cause of the anemia. This information is not intended to replace advice given to you by  your health care provider. Make sure you discuss any questions you have with your health care provider. Document Revised: 12/21/2016 Document Reviewed: 02/10/2016 Elsevier Patient Education  [place].

## 2019-09-23 LAB — TYPE AND SCREEN
ABO/RH(D): A POS
Antibody Screen: NEGATIVE
Unit division: 0

## 2019-09-23 LAB — BPAM RBC
Blood Product Expiration Date: 202109192359
ISSUE DATE / TIME: 202108310755
Unit Type and Rh: 6200

## 2019-09-24 ENCOUNTER — Other Ambulatory Visit: Payer: Self-pay | Admitting: Hematology & Oncology

## 2019-09-24 LAB — PROTEIN ELECTROPHORESIS, SERUM, WITH REFLEX
A/G Ratio: 0.9 (ref 0.7–1.7)
Albumin ELP: 3.6 g/dL (ref 2.9–4.4)
Alpha-1-Globulin: 0.2 g/dL (ref 0.0–0.4)
Alpha-2-Globulin: 0.9 g/dL (ref 0.4–1.0)
Beta Globulin: 2.3 g/dL — ABNORMAL HIGH (ref 0.7–1.3)
Gamma Globulin: 0.4 g/dL (ref 0.4–1.8)
Globulin, Total: 3.9 g/dL (ref 2.2–3.9)
M-Spike, %: 1.3 g/dL — ABNORMAL HIGH
SPEP Interpretation: 0
Total Protein ELP: 7.5 g/dL (ref 6.0–8.5)

## 2019-09-24 LAB — IMMUNOFIXATION REFLEX, SERUM
IgA: 2090 mg/dL — ABNORMAL HIGH (ref 61–437)
IgG (Immunoglobin G), Serum: 110 mg/dL — ABNORMAL LOW (ref 603–1613)
IgM (Immunoglobulin M), Srm: <5 mg/dL — ABNORMAL LOW (ref 15–143)

## 2019-09-25 DIAGNOSIS — R350 Frequency of micturition: Secondary | ICD-10-CM | POA: Diagnosis not present

## 2019-09-25 DIAGNOSIS — R351 Nocturia: Secondary | ICD-10-CM | POA: Diagnosis not present

## 2019-09-29 ENCOUNTER — Inpatient Hospital Stay: Payer: PPO

## 2019-09-29 ENCOUNTER — Inpatient Hospital Stay: Payer: PPO | Attending: Hematology & Oncology

## 2019-09-29 ENCOUNTER — Inpatient Hospital Stay (HOSPITAL_BASED_OUTPATIENT_CLINIC_OR_DEPARTMENT_OTHER): Payer: PPO | Admitting: Hematology & Oncology

## 2019-09-29 ENCOUNTER — Encounter: Payer: Self-pay | Admitting: Hematology & Oncology

## 2019-09-29 ENCOUNTER — Other Ambulatory Visit: Payer: Self-pay

## 2019-09-29 ENCOUNTER — Telehealth: Payer: Self-pay | Admitting: *Deleted

## 2019-09-29 VITALS — BP 150/72 | HR 71 | Temp 98.2°F | Resp 20 | Wt 191.1 lb

## 2019-09-29 DIAGNOSIS — C9000 Multiple myeloma not having achieved remission: Secondary | ICD-10-CM

## 2019-09-29 DIAGNOSIS — C9002 Multiple myeloma in relapse: Secondary | ICD-10-CM | POA: Diagnosis not present

## 2019-09-29 DIAGNOSIS — Z79899 Other long term (current) drug therapy: Secondary | ICD-10-CM | POA: Insufficient documentation

## 2019-09-29 DIAGNOSIS — Z5111 Encounter for antineoplastic chemotherapy: Secondary | ICD-10-CM | POA: Insufficient documentation

## 2019-09-29 DIAGNOSIS — D649 Anemia, unspecified: Secondary | ICD-10-CM | POA: Insufficient documentation

## 2019-09-29 LAB — CBC WITH DIFFERENTIAL (CANCER CENTER ONLY)
Abs Immature Granulocytes: 0.17 10*3/uL — ABNORMAL HIGH (ref 0.00–0.07)
Basophils Absolute: 0 10*3/uL (ref 0.0–0.1)
Basophils Relative: 0 %
Eosinophils Absolute: 0 10*3/uL (ref 0.0–0.5)
Eosinophils Relative: 1 %
HCT: 23.9 % — ABNORMAL LOW (ref 39.0–52.0)
Hemoglobin: 7.9 g/dL — ABNORMAL LOW (ref 13.0–17.0)
Immature Granulocytes: 6 %
Lymphocytes Relative: 21 %
Lymphs Abs: 0.6 10*3/uL — ABNORMAL LOW (ref 0.7–4.0)
MCH: 32.4 pg (ref 26.0–34.0)
MCHC: 33.1 g/dL (ref 30.0–36.0)
MCV: 98 fL (ref 80.0–100.0)
Monocytes Absolute: 0.6 10*3/uL (ref 0.1–1.0)
Monocytes Relative: 21 %
Neutro Abs: 1.5 10*3/uL — ABNORMAL LOW (ref 1.7–7.7)
Neutrophils Relative %: 51 %
Platelet Count: 42 10*3/uL — ABNORMAL LOW (ref 150–400)
RBC: 2.44 MIL/uL — ABNORMAL LOW (ref 4.22–5.81)
RDW: 19.8 % — ABNORMAL HIGH (ref 11.5–15.5)
WBC Count: 2.9 10*3/uL — ABNORMAL LOW (ref 4.0–10.5)
nRBC: 0.7 % — ABNORMAL HIGH (ref 0.0–0.2)

## 2019-09-29 LAB — CMP (CANCER CENTER ONLY)
ALT: 14 U/L (ref 0–44)
AST: 29 U/L (ref 15–41)
Albumin: 3.9 g/dL (ref 3.5–5.0)
Alkaline Phosphatase: 48 U/L (ref 38–126)
Anion gap: 12 (ref 5–15)
BUN: 19 mg/dL (ref 8–23)
CO2: 26 mmol/L (ref 22–32)
Calcium: 9.9 mg/dL (ref 8.9–10.3)
Chloride: 100 mmol/L (ref 98–111)
Creatinine: 1.03 mg/dL (ref 0.61–1.24)
GFR, Est AFR Am: >60 mL/min (ref >60)
GFR, Estimated: >60 mL/min (ref >60)
Glucose, Bld: 96 mg/dL (ref 70–99)
Potassium: 4 mmol/L (ref 3.5–5.1)
Sodium: 138 mmol/L (ref 135–145)
Total Bilirubin: 0.5 mg/dL (ref 0.3–1.2)
Total Protein: 7.8 g/dL (ref 6.5–8.1)

## 2019-09-29 LAB — SAMPLE TO BLOOD BANK

## 2019-09-29 MED ORDER — HEPARIN SOD (PORK) LOCK FLUSH 100 UNIT/ML IV SOLN
500.0000 [IU] | Freq: Once | INTRAVENOUS | Status: AC | PRN
  Administered 2019-09-29: 500 [IU]
  Filled 2019-09-29: qty 5

## 2019-09-29 MED ORDER — SODIUM CHLORIDE 0.9 % IV SOLN
Freq: Once | INTRAVENOUS | Status: AC
  Filled 2019-09-29: qty 250

## 2019-09-29 MED ORDER — SODIUM CHLORIDE 0.9% FLUSH
10.0000 mL | INTRAVENOUS | Status: DC | PRN
  Administered 2019-09-29: 10 mL
  Filled 2019-09-29: qty 10

## 2019-09-29 MED ORDER — SODIUM CHLORIDE 0.9 % IV SOLN
20.0000 mg | Freq: Once | INTRAVENOUS | Status: AC
  Administered 2019-09-29: 20 mg via INTRAVENOUS
  Filled 2019-09-29: qty 20

## 2019-09-29 MED ORDER — SODIUM CHLORIDE 0.9 % IV SOLN
30.0000 mg | Freq: Once | INTRAVENOUS | Status: AC
  Administered 2019-09-29: 30 mg via INTRAVENOUS
  Filled 2019-09-29: qty 60

## 2019-09-29 MED ORDER — PALONOSETRON HCL INJECTION 0.25 MG/5ML
0.2500 mg | Freq: Once | INTRAVENOUS | Status: AC
  Administered 2019-09-29: 0.25 mg via INTRAVENOUS

## 2019-09-29 MED ORDER — PALONOSETRON HCL INJECTION 0.25 MG/5ML
INTRAVENOUS | Status: AC
  Filled 2019-09-29: qty 5

## 2019-09-29 NOTE — Progress Notes (Signed)
Hematology and Oncology Follow Up Visit  Gene Hill [id number] 12-23-1940 79 y.o. 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 Seipp is here today with his wife for follow-up.  His blood counts had a little bit of a tough time at the last cycle of treatment.  We will have to cut back for his second cycle of treatment.    So far, the myeloma numbers really have not changed much.  [name] is 1.3 g/dL.  The IgA level is 1900 mg/dL.  The kappa light chain is 1700 mg/dL.  I really hope that we will see a drop in his numbers after this cycle.  I will have to cut back on this cycle of Melflufen because of the pancytopenia that he has.  I just wonder if the low blood counts were not reflective of marrow involvement by myeloma.  He really has no problems with pain.  He does get tired quite easily.  He has had no nausea or vomiting.  There has been no diarrhea.  He has had no headache.  There has been no leg swelling.  He has had a number of orthopedic surgeries.  Thankfully his joints are doing pretty well right now.  Overall, I would say his performance status is probably ECOG 1.    Medications:  Allergies as of 09/29/2019   No Known Allergies     Medication List       Accurate as of September 29, 2019 11:18 AM. If you have any questions, ask your nurse or doctor.        STOP taking these medications   atorvastatin 10 MG tablet Commonly  known as: LIPITOR Stopped by: [name], MD   methylPREDNISolone acetate 80 MG/ML injection Commonly known as: DEPO-MEDROL Stopped by: [name], MD   Xpovio (100 MG Once Weekly) Therapy Pack (100 mg once weekly) Generic drug: selinexor Stopped by: [name], MD     TAKE these medications   acetaminophen 500 MG tablet Commonly known as: TYLENOL Take 1,000 mg by mouth 3 (three) times daily as needed.   aspirin 325 MG tablet Take 325 mg by mouth daily.   b complex vitamins tablet Take 1 tablet by mouth daily.   calcium carbonate 1500 (600 Ca) MG Tabs tablet Commonly known as: OSCAL Take 600 mg of elemental calcium by mouth daily with breakfast.   dronabinol 2.5 MG capsule Commonly known as: MARINOL TAKE 1 CAPSULE(2.5 MG) BY MOUTH TWICE DAILY BEFORE A MEAL   famciclovir 250 MG tablet Commonly known as: FAMVIR Take 1 tablet (250 mg total) by mouth daily.   Fish Oil 1200 MG Caps Take 1,200 mg by mouth daily.   lidocaine-prilocaine cream Commonly known as: EMLA Apply to affected area once   LORazepam 0.5 MG tablet Commonly known as: Ativan Take 1 tablet (0.5 mg total) by mouth every 6 (six) hours as  needed (Nausea or vomiting).   methocarbamol 500 MG tablet Commonly known as: ROBAXIN Take 1 tablet (500 mg total) by mouth 3 (three) times daily as needed for muscle spasms.   metoprolol tartrate 50 MG tablet Commonly known as: LOPRESSOR Take 1 tablet (50 mg total) by mouth 2 (two) times daily.   mirtazapine 7.5 MG tablet Commonly known as: REMERON Take 1 tablet (7.5 mg total) by mouth at bedtime.   multivitamin tablet Take 1 tablet by mouth daily.   ondansetron 8 MG tablet Commonly known as: Zofran Take 1 tablet (8 mg total) by mouth 2 (two) times daily as needed for refractory nausea / vomiting. Start on day 3 after chemotherapy.   oxybutynin 10 MG 24 hr tablet Commonly known as: DITROPAN-XL Take 10 mg by mouth daily.   pantoprazole 40 MG  tablet Commonly known as: PROTONIX Take 1 tablet (40 mg total) by mouth at bedtime.   PROBIOTIC PO Take 1 tablet by mouth daily.   prochlorperazine 10 MG tablet Commonly known as: COMPAZINE Take 1 tablet (10 mg total) by mouth every 6 (six) hours as needed (Nausea or vomiting).   tamsulosin 0.4 MG Caps capsule Commonly known as: FLOMAX Take 0.4 mg by mouth daily.   traMADol 50 MG tablet Commonly known as: ULTRAM Take 1 tablet (50 mg total) by mouth every 6 (six) hours as needed. 90 day supply please   VITAMIN B-12 PO Take 5,000 mcg by mouth daily.       Allergies: No Known Allergies  Past Medical History, Surgical history, Social history, and Family History were reviewed and updated.  Review of Systems: Review of Systems  Constitutional: Positive for malaise/fatigue.  HENT: Negative.   Eyes: Negative.   Respiratory: Positive for shortness of breath.   Cardiovascular: Negative.   Gastrointestinal: Positive for nausea.  Genitourinary: Negative.   Musculoskeletal: Positive for back pain and joint pain.  Skin: Negative.   Neurological: Positive for weakness.  Endo/Heme/Allergies: Bruises/bleeds easily.  Psychiatric/Behavioral: Negative.      Physical Exam:  weight is 191 lb 1.9 oz (86.7 kg). His oral temperature is 98.2 F (36.8 C). His blood pressure is 150/72 (abnormal) and his pulse is 71. His respiration is 20 and oxygen saturation is 99%.   Wt Readings from Last 3 Encounters:  09/29/19 191 lb 1.9 oz (86.7 kg)  09/21/19 191 lb (86.6 kg)  09/07/19 194 lb (88 kg)    His vital signs are temperature of 97.8.  Pulse 71.  Blood pressure 123/65.  Weight is 198 pounds.  Physical Exam Vitals reviewed.  HENT:     Head: Normocephalic and atraumatic.  Eyes:     Pupils: Pupils are equal, round, and reactive to light.  Cardiovascular:     Rate and Rhythm: Normal rate and regular rhythm.     Heart sounds: Normal heart sounds.  Pulmonary:     Effort: Pulmonary  effort is normal.     Breath sounds: Normal breath sounds.  Abdominal:     General: Bowel sounds are normal.     Palpations: Abdomen is soft.  Musculoskeletal:        General: No tenderness or deformity. Normal range of motion.     Cervical back: Normal range of motion.  Lymphadenopathy:     Cervical: No cervical adenopathy.  Skin:    General: Skin is warm and dry.     Findings: No erythema or rash.  Neurological:     Mental Status: He is alert and oriented  to person, place, and time.  Psychiatric:        Behavior: Behavior normal.        Thought Content: Thought content normal.        Judgment: Judgment normal.      Lab Results  Component Value Date   WBC 2.9 (L) 09/29/2019   HGB 7.9 (L) 09/29/2019   HCT 23.9 (L) 09/29/2019   MCV 98.0 09/29/2019   PLT 42 (L) 09/29/2019   Lab Results  Component Value Date   FERRITIN 468 (H) 07/21/2019   IRON 195 (H) 07/21/2019   TIBC 235 07/21/2019   UIBC 40 (L) 07/21/2019   IRONPCTSAT 83 (H) 07/21/2019   Lab Results  Component Value Date   RETICCTPCT 2.1 09/21/2019   RBC 2.44 (L) 09/29/2019   Lab Results  Component Value Date   KPAFRELGTCHN 1,697.7 (H) 09/21/2019   LAMBDASER 10.2 09/21/2019   KAPLAMBRATIO 166.44 (H) 09/21/2019   Lab Results  Component Value Date   IGGSERUM 89 (L) 09/21/2019   IGGSERUM 110 (L) 09/21/2019   IGA 1,809 (H) 09/21/2019   IGA 2,090 (H) 09/21/2019   IGMSERUM <5 (L) 09/21/2019   IGMSERUM <5 (L) 09/21/2019   Lab Results  Component Value Date   TOTALPROTELP 7.5 09/21/2019   ALBUMINELP 3.6 09/21/2019   A1GS 0.2 09/21/2019   A2GS 0.9 09/21/2019   BETS 2.3 (H) 09/21/2019   BETA2SER 2.0 (H) 12/08/2014   GAMS 0.4 09/21/2019   MSPIKE 1.3 (H) 09/21/2019   SPEI Comment 06/30/2019     Chemistry      Component Value Date/Time   NA 138 09/29/2019 0951   NA 141 01/21/2017 0741   NA 139 06/14/2016 1246   K 4.0 09/29/2019 0951   K 4.0 01/21/2017 0741   K 3.8 06/14/2016 1246   CL 100 09/29/2019  0951   CL 103 01/21/2017 0741   CO2 26 09/29/2019 0951   CO2 30 01/21/2017 0741   CO2 24 06/14/2016 1246   BUN 19 09/29/2019 0951   BUN 20 01/21/2017 0741   BUN 15.0 06/14/2016 1246   CREATININE 1.03 09/29/2019 0951   CREATININE 1.0 01/21/2017 0741   CREATININE 0.9 06/14/2016 1246      Component Value Date/Time   CALCIUM 9.9 09/29/2019 0951   CALCIUM 9.0 01/21/2017 0741   CALCIUM 9.6 06/14/2016 1246   ALKPHOS 48 09/29/2019 0951   ALKPHOS 37 01/21/2017 0741   ALKPHOS 54 06/14/2016 1246   AST 29 09/29/2019 0951   AST 25 06/14/2016 1246   ALT 14 09/29/2019 0951   ALT 29 01/21/2017 0741   ALT 21 06/14/2016 1246   BILITOT 0.5 09/29/2019 0951   BILITOT 0.37 06/14/2016 1246       Impression and Plan: Mr. Phillis is a very pleasant 79 yo caucasian gentleman with history of IgA kappa myeloma, high risk cytogenetics with a 17p- abnormality.   Actually, I am surprised that he is done so well despite having the 17p-abnormality.  I do think that we have to move ahead with the Melflufen.  We will have to check his blood counts weekly for right now.  We may have to transfuse him.  I would not be surprised if this happened.  If I find that his numbers are still going upward, we will stop the Melflufen.  At that point, I am not sure we could really offer him any other treatment.     [name], MD 9/7/202111:18 AM

## 2019-09-29 NOTE — Progress Notes (Signed)
Reviewed labs with Dr. Marin Olp.  Ok to treat today.  No blood or platelets today.

## 2019-09-29 NOTE — Patient Instructions (Signed)
[facility] Discharge Instructions for Patients Receiving Chemotherapy  Today you received the following chemotherapy agents Pepaxto  To help prevent nausea and vomiting after your treatment, we encourage you to take your nausea medication as prescribed by MD. **DO NOT TAKE ZOFRAN FOR 3 DAYS AFTER CHEMOTHERAPY**   If you develop nausea and vomiting that is not controlled by your nausea medication, call the clinic.   BELOW ARE SYMPTOMS THAT SHOULD BE REPORTED IMMEDIATELY:  *FEVER GREATER THAN 100.5 F  *CHILLS WITH OR WITHOUT FEVER  NAUSEA AND VOMITING THAT IS NOT CONTROLLED WITH YOUR NAUSEA MEDICATION  *UNUSUAL SHORTNESS OF BREATH  *UNUSUAL BRUISING OR BLEEDING  TENDERNESS IN MOUTH AND THROAT WITH OR WITHOUT PRESENCE OF ULCERS  *URINARY PROBLEMS  *BOWEL PROBLEMS  UNUSUAL RASH Items with * indicate a potential emergency and should be followed up as soon as possible.  Feel free to call the clinic should you have any questions or concerns. The clinic phone number is [phone].  Please show the [place] at check-in to the Emergency Department and triage nurse.

## 2019-09-29 NOTE — Telephone Encounter (Signed)
Dr. Marin Olp notified of HGB-7.9, WBC-2.9, platelet count-42 and ANC-1.5.  No new orders received at this time.

## 2019-09-29 NOTE — Patient Instructions (Signed)
Implanted Port Insertion, Care After °This sheet gives you information about how to care for yourself after your procedure. Your health care provider may also give you more specific instructions. If you have problems or questions, contact your health care provider. °What can I expect after the procedure? °After the procedure, it is common to have: °· Discomfort at the port insertion site. °· Bruising on the skin over the port. This should improve over 3-4 days. °Follow these instructions at home: °Port care °· After your port is placed, you will get a manufacturer's information card. The card has information about your port. Keep this card with you at all times. °· Take care of the port as told by your health care provider. Ask your health care provider if you or a family member can get training for taking care of the port at home. A home health care nurse may also take care of the port. °· Make sure to remember what type of port you have. °Incision care ° °  ° °· Follow instructions from your health care provider about how to take care of your port insertion site. Make sure you: °? Wash your hands with soap and water before and after you change your bandage (dressing). If soap and water are not available, use hand sanitizer. °? Change your dressing as told by your health care provider. °? Leave stitches (sutures), skin glue, or adhesive strips in place. These skin closures may need to stay in place for 2 weeks or longer. If adhesive strip edges start to loosen and curl up, you may trim the loose edges. Do not remove adhesive strips completely unless your health care provider tells you to do that. °· Check your port insertion site every day for signs of infection. Check for: °? Redness, swelling, or pain. °? Fluid or blood. °? Warmth. °? Pus or a bad smell. °Activity °· Return to your normal activities as told by your health care provider. Ask your health care provider what activities are safe for you. °· Do not  lift anything that is heavier than 10 lb (4.5 kg), or the limit that you are told, until your health care provider says that it is safe. °General instructions °· Take over-the-counter and prescription medicines only as told by your health care provider. °· Do not take baths, swim, or use a hot tub until your health care provider approves. Ask your health care provider if you may take showers. You may only be allowed to take sponge baths. °· Do not drive for 24 hours if you were given a sedative during your procedure. °· Wear a medical alert bracelet in case of an emergency. This will tell any health care providers that you have a port. °· Keep all follow-up visits as told by your health care provider. This is important. °Contact a health care provider if: °· You cannot flush your port with saline as directed, or you cannot draw blood from the port. °· You have a fever or chills. °· You have redness, swelling, or pain around your port insertion site. °· You have fluid or blood coming from your port insertion site. °· Your port insertion site feels warm to the touch. °· You have pus or a bad smell coming from the port insertion site. °Get help right away if: °· You have chest pain or shortness of breath. °· You have bleeding from your port that you cannot control. °Summary °· Take care of the port as told by your health   care provider. Keep the manufacturer's information card with you at all times. °· Change your dressing as told by your health care provider. °· Contact a health care provider if you have a fever or chills or if you have redness, swelling, or pain around your port insertion site. °· Keep all follow-up visits as told by your health care provider. °This information is not intended to replace advice given to you by your health care provider. Make sure you discuss any questions you have with your health care provider. °Document Revised: 08/06/2017 Document Reviewed: 08/06/2017 °Elsevier Patient Education ©  2020 Elsevier Inc. ° °

## 2019-09-29 NOTE — Progress Notes (Signed)
Dr. Marin Olp has reviewed today's CBC and is aware of pltc 53. He would like to proceed with Pepaxto today. He will reduce the dose to 30 mg. Dose reduced per his instructions.

## 2019-09-30 LAB — IGG, IGA, IGM
IgA: 2656 mg/dL — ABNORMAL HIGH (ref 61–437)
IgG (Immunoglobin G), Serum: 101 mg/dL — ABNORMAL LOW (ref 603–1613)
IgM (Immunoglobulin M), Srm: <5 mg/dL — ABNORMAL LOW (ref 15–143)

## 2019-09-30 LAB — KAPPA/LAMBDA LIGHT CHAINS
Kappa free light chain: 3169 mg/L — ABNORMAL HIGH (ref 3.3–19.4)
Kappa, lambda light chain ratio: 660.21 — ABNORMAL HIGH (ref 0.26–1.65)
Lambda free light chains: 4.8 mg/L — ABNORMAL LOW (ref 5.7–26.3)

## 2019-10-01 LAB — PROTEIN ELECTROPHORESIS, SERUM, WITH REFLEX
A/G Ratio: 0.9 (ref 0.7–1.7)
Albumin ELP: 3.7 g/dL (ref 2.9–4.4)
Alpha-1-Globulin: 0.3 g/dL (ref 0.0–0.4)
Alpha-2-Globulin: 0.9 g/dL (ref 0.4–1.0)
Beta Globulin: 2.5 g/dL — ABNORMAL HIGH (ref 0.7–1.3)
Gamma Globulin: 0.6 g/dL (ref 0.4–1.8)
Globulin, Total: 4.3 g/dL — ABNORMAL HIGH (ref 2.2–3.9)
M-Spike, %: 1 g/dL — ABNORMAL HIGH
SPEP Interpretation: 0
Total Protein ELP: 8 g/dL (ref 6.0–8.5)

## 2019-10-01 LAB — IMMUNOFIXATION REFLEX, SERUM
IgA: 2625 mg/dL — ABNORMAL HIGH (ref 61–437)
IgG (Immunoglobin G), Serum: 103 mg/dL — ABNORMAL LOW (ref 603–1613)
IgM (Immunoglobulin M), Srm: <5 mg/dL — ABNORMAL LOW (ref 15–143)

## 2019-10-02 DIAGNOSIS — R350 Frequency of micturition: Secondary | ICD-10-CM | POA: Diagnosis not present

## 2019-10-05 ENCOUNTER — Inpatient Hospital Stay: Payer: PPO

## 2019-10-06 ENCOUNTER — Inpatient Hospital Stay: Payer: PPO

## 2019-10-06 ENCOUNTER — Other Ambulatory Visit: Payer: Self-pay | Admitting: Family

## 2019-10-06 ENCOUNTER — Other Ambulatory Visit: Payer: PPO

## 2019-10-06 ENCOUNTER — Telehealth: Payer: Self-pay | Admitting: *Deleted

## 2019-10-06 ENCOUNTER — Encounter: Payer: Self-pay | Admitting: *Deleted

## 2019-10-06 ENCOUNTER — Other Ambulatory Visit: Payer: Self-pay | Admitting: *Deleted

## 2019-10-06 ENCOUNTER — Other Ambulatory Visit: Payer: Self-pay

## 2019-10-06 VITALS — BP 148/61 | HR 73 | Temp 98.7°F | Resp 20

## 2019-10-06 DIAGNOSIS — C9000 Multiple myeloma not having achieved remission: Secondary | ICD-10-CM

## 2019-10-06 DIAGNOSIS — Z95828 Presence of other vascular implants and grafts: Secondary | ICD-10-CM

## 2019-10-06 DIAGNOSIS — Z5111 Encounter for antineoplastic chemotherapy: Secondary | ICD-10-CM | POA: Diagnosis not present

## 2019-10-06 DIAGNOSIS — D649 Anemia, unspecified: Secondary | ICD-10-CM

## 2019-10-06 LAB — BASIC METABOLIC PANEL - CANCER CENTER ONLY
Anion gap: 11 (ref 5–15)
BUN: 23 mg/dL (ref 8–23)
CO2: 26 mmol/L (ref 22–32)
Calcium: 9.6 mg/dL (ref 8.9–10.3)
Chloride: 101 mmol/L (ref 98–111)
Creatinine: 0.93 mg/dL (ref 0.61–1.24)
GFR, Est AFR Am: >60 mL/min (ref >60)
GFR, Estimated: >60 mL/min (ref >60)
Glucose, Bld: 99 mg/dL (ref 70–99)
Potassium: 4.1 mmol/L (ref 3.5–5.1)
Sodium: 138 mmol/L (ref 135–145)

## 2019-10-06 LAB — CBC WITH DIFFERENTIAL (CANCER CENTER ONLY)
Abs Immature Granulocytes: 0.01 10*3/uL (ref 0.00–0.07)
Basophils Absolute: 0 10*3/uL (ref 0.0–0.1)
Basophils Relative: 0 %
Eosinophils Absolute: 0 10*3/uL (ref 0.0–0.5)
Eosinophils Relative: 1 %
HCT: 19.9 % — ABNORMAL LOW (ref 39.0–52.0)
Hemoglobin: 6.7 g/dL — CL (ref 13.0–17.0)
Immature Granulocytes: 1 %
Lymphocytes Relative: 19 %
Lymphs Abs: 0.2 10*3/uL — ABNORMAL LOW (ref 0.7–4.0)
MCH: 32.5 pg (ref 26.0–34.0)
MCHC: 33.7 g/dL (ref 30.0–36.0)
MCV: 96.6 fL (ref 80.0–100.0)
Monocytes Absolute: 0.2 10*3/uL (ref 0.1–1.0)
Monocytes Relative: 24 %
Neutro Abs: 0.5 10*3/uL — ABNORMAL LOW (ref 1.7–7.7)
Neutrophils Relative %: 55 %
Platelet Count: 38 10*3/uL — ABNORMAL LOW (ref 150–400)
RBC: 2.06 MIL/uL — ABNORMAL LOW (ref 4.22–5.81)
RDW: 19.7 % — ABNORMAL HIGH (ref 11.5–15.5)
WBC Count: 1 10*3/uL — ABNORMAL LOW (ref 4.0–10.5)
nRBC: 0 % (ref 0.0–0.2)

## 2019-10-06 LAB — SAMPLE TO BLOOD BANK

## 2019-10-06 LAB — PREPARE RBC (CROSSMATCH)

## 2019-10-06 MED ORDER — HEPARIN SOD (PORK) LOCK FLUSH 100 UNIT/ML IV SOLN
500.0000 [IU] | Freq: Once | INTRAVENOUS | Status: AC
  Administered 2019-10-06: 500 [IU] via INTRAVENOUS
  Filled 2019-10-06: qty 5

## 2019-10-06 MED ORDER — SODIUM CHLORIDE 0.9% FLUSH
10.0000 mL | Freq: Once | INTRAVENOUS | Status: AC
  Administered 2019-10-06: 10 mL via INTRAVENOUS
  Filled 2019-10-06: qty 10

## 2019-10-06 NOTE — Telephone Encounter (Signed)
Dr. Marin Olp notified of hgb-6.7.  Order received for pt to get two units of PRBC's tomorrow per Dr. Marin Olp.

## 2019-10-06 NOTE — Progress Notes (Signed)
Patient will receive blood transfusion tomorrow per Dr.Ennever. Patient requested to be left accessed. biopatch placed with sorbaview dressing, patient understands the precautions with leaving needle in. He will be back in tomorrow at 930 for blood transfusion.

## 2019-10-06 NOTE — Patient Instructions (Signed)

## 2019-10-06 NOTE — Progress Notes (Signed)
Dr. Marin Olp notified of WBC-1.0 and ANC-0.5.  Order received for pt to be seen by Dr Marin Olp tomorrow.  Message sent to scheduling.

## 2019-10-07 ENCOUNTER — Inpatient Hospital Stay (HOSPITAL_BASED_OUTPATIENT_CLINIC_OR_DEPARTMENT_OTHER): Payer: PPO | Admitting: Hematology & Oncology

## 2019-10-07 ENCOUNTER — Other Ambulatory Visit: Payer: Self-pay | Admitting: Hematology & Oncology

## 2019-10-07 ENCOUNTER — Encounter: Payer: Self-pay | Admitting: Hematology & Oncology

## 2019-10-07 ENCOUNTER — Inpatient Hospital Stay: Payer: PPO

## 2019-10-07 DIAGNOSIS — Z5111 Encounter for antineoplastic chemotherapy: Secondary | ICD-10-CM | POA: Diagnosis not present

## 2019-10-07 DIAGNOSIS — C9000 Multiple myeloma not having achieved remission: Secondary | ICD-10-CM | POA: Diagnosis not present

## 2019-10-07 DIAGNOSIS — D649 Anemia, unspecified: Secondary | ICD-10-CM

## 2019-10-07 MED ORDER — ACETAMINOPHEN 325 MG PO TABS: 650.0000 mg | ORAL_TABLET | Freq: Once | ORAL | Status: DC

## 2019-10-07 MED ORDER — SODIUM CHLORIDE 0.9% FLUSH
3.0000 mL | INTRAVENOUS | Status: DC | PRN
  Filled 2019-10-07: qty 10

## 2019-10-07 MED ORDER — FUROSEMIDE 10 MG/ML IJ SOLN: 20.0000 mg | Freq: Once | INTRAMUSCULAR | Status: DC

## 2019-10-07 MED ORDER — DIPHENHYDRAMINE HCL 25 MG PO CAPS: 25.0000 mg | ORAL_CAPSULE | Freq: Once | ORAL | Status: DC

## 2019-10-07 MED ORDER — HEPARIN SOD (PORK) LOCK FLUSH 100 UNIT/ML IV SOLN
500.0000 [IU] | Freq: Once | INTRAVENOUS | Status: AC
  Administered 2019-10-07: 500 [IU] via INTRAVENOUS
  Filled 2019-10-07: qty 5

## 2019-10-07 MED ORDER — SODIUM CHLORIDE 0.9% FLUSH
10.0000 mL | Freq: Once | INTRAVENOUS | Status: AC
  Administered 2019-10-07: 10 mL via INTRAVENOUS
  Filled 2019-10-07: qty 10

## 2019-10-07 NOTE — Patient Instructions (Signed)
Anemia  Anemia is a condition in which you do not have enough red blood cells or hemoglobin. Hemoglobin is a substance in red blood cells that carries oxygen. When you do not have enough red blood cells or hemoglobin (are anemic), your body cannot get enough oxygen and your organs may not work properly. As a result, you may feel very tired or have other problems. What are the causes? Common causes of anemia include:  Excessive bleeding. Anemia can be caused by excessive bleeding inside or outside the body, including bleeding from the intestine or from periods in women.  Poor nutrition.  Long-lasting (chronic) kidney, thyroid, and liver disease.  Bone marrow disorders.  Cancer and treatments for cancer.  HIV (human immunodeficiency virus) and AIDS (acquired immunodeficiency syndrome).  Treatments for HIV and AIDS.  Spleen problems.  Blood disorders.  Infections, medicines, and autoimmune disorders that destroy red blood cells. What are the signs or symptoms? Symptoms of this condition include:  Minor weakness.  Dizziness.  Headache.  Feeling heartbeats that are irregular or faster than normal (palpitations).  Shortness of breath, especially with exercise.  Paleness.  Cold sensitivity.  Indigestion.  Nausea.  Difficulty sleeping.  Difficulty concentrating. Symptoms may occur suddenly or develop slowly. If your anemia is mild, you may not have symptoms. How is this diagnosed? This condition is diagnosed based on:  Blood tests.  Your medical history.  A physical exam.  Bone marrow biopsy. Your health care provider may also check your stool (feces) for blood and may do additional testing to look for the cause of your bleeding. You may also have other tests, including:  Imaging tests, such as a CT scan or MRI.  Endoscopy.  Colonoscopy. How is this treated? Treatment for this condition depends on the cause. If you continue to lose a lot of blood, you may  need to be treated at a hospital. Treatment may include:  Taking supplements of iron, vitamin S31, or folic acid.  Taking a hormone medicine (erythropoietin) that can help to stimulate red blood cell growth.  Having a blood transfusion. This may be needed if you lose a lot of blood.  Making changes to your diet.  Having surgery to remove your spleen. Follow these instructions at home:  Take over-the-counter and prescription medicines only as told by your health care provider.  Take supplements only as told by your health care provider.  Follow any diet instructions that you were given.  Keep all follow-up visits as told by your health care provider. This is important. Contact a health care provider if:  You develop new bleeding anywhere in the body. Get help right away if:  You are very weak.  You are short of breath.  You have pain in your abdomen or chest.  You are dizzy or feel faint.  You have trouble concentrating.  You have bloody or black, tarry stools.  You vomit repeatedly or you vomit up blood. Summary  Anemia is a condition in which you do not have enough red blood cells or enough of a substance in your red blood cells that carries oxygen (hemoglobin).  Symptoms may occur suddenly or develop slowly.  If your anemia is mild, you may not have symptoms.  This condition is diagnosed with blood tests as well as a medical history and physical exam. Other tests may be needed.  Treatment for this condition depends on the cause of the anemia. This information is not intended to replace advice given to you by  your health care provider. Make sure you discuss any questions you have with your health care provider. Document Revised: 12/21/2016 Document Reviewed: 02/10/2016 Elsevier Patient Education  [place].

## 2019-10-07 NOTE — Progress Notes (Signed)
Hematology and Oncology Follow Up Visit  Gene Hill [id number] 11/06/1940 79 y.o. 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, +11, 13q- and 17p-)  Past Therapy: Pomalyst stopped 12/25/2016 Daratumumab q 4 week dosing -changed on 10/28/2017 - s/p cycle 35  Carfilzomib -- s/p cycle #1 on 04/07/2019 (3 wk on/1 off)  Current Therapy: Selinexor 100 mg po q day - start 05/25/2019 -- d/c on 06/29/2019 Velcade 1.3 mg/m2 sq q week -- start on 05/03/202 - d/c on 06/2019 Melflufen 40 mg IV q month -status post cycle #1 - started on 08/24/2019 -- d/coc on 10/06/2019 Bendamustine 80 mg/m2 day 1,2 -- start cycle #1 on 10/13/2019 Xgeva 120 m sq q 3 months - next dose 09/2019   Interim History:  Gene Hill is here today for an early appointment.  Unfortunately, it is so it looks like the Melflufen is not working.  His kappa light chain has gone up quite quickly.  His kappa light chain we last checked over a week ago was almost 3200 mg/L.  Again I think this is an indicator that things are not improving.  His IgA level was 2650 mg/dL.  I just hate that we are losing options for him.  He still is in pretty good shape.  Unfortunate, his bone marrow is having a hard time.  I think the bone marrow is probably infiltrated with myeloma and it is not able to make normal blood cells.  Our options are limited.  I think we could consider him for bendamustine.  I think this would be reasonable.  It would be tolerable.  The other option would be Blenrep.  The problem with this is all the eye exams that patients needed.  We clearly are talking about quality of life for Gene Hill.  He is a was valued quality of life.  He has a nice garden that he helps provide vegetables for church members.  He is getting blood today.  He does feel tired.  However, he does well with transfusions.  He has had no problems with bowels or bladder.  His appetite has been  okay.  He has had no nausea or vomiting.  Overall, I would say his performance status is probably ECOG 1.    Medications:  Allergies as of 10/07/2019   No Known Allergies     Medication List       Accurate as of October 07, 2019  1:29 PM. If you have any questions, ask your nurse or doctor.        STOP taking these medications   lidocaine-prilocaine cream Commonly known as: EMLA Stopped by: [name], MD   LORazepam 0.5 MG tablet Commonly known as: Ativan Stopped by: [name], MD   ondansetron 8 MG tablet Commonly known as: Zofran Stopped by: [name], MD   prochlorperazine 10 MG tablet Commonly known as: COMPAZINE Stopped by: [name], MD     TAKE these medications   acetaminophen 500 MG tablet Commonly known as: TYLENOL Take 1,000 mg by mouth 3 (three) times daily as needed.   aspirin 325 MG tablet Take 325 mg by mouth daily.   b complex vitamins tablet Take 1 tablet by mouth daily.   calcium carbonate 1500 (600 Ca) MG Tabs tablet Commonly known as: OSCAL Take 600 mg of elemental calcium by mouth daily with breakfast.   dronabinol 2.5 MG capsule Commonly known as: MARINOL TAKE 1 CAPSULE(2.5 MG)  BY MOUTH TWICE DAILY BEFORE A MEAL   famciclovir 250 MG tablet Commonly known as: FAMVIR Take 1 tablet (250 mg total) by mouth daily.   Fish Oil 1200 MG Caps Take 1,200 mg by mouth daily.   methocarbamol 500 MG tablet Commonly known as: ROBAXIN Take 1 tablet (500 mg total) by mouth 3 (three) times daily as needed for muscle spasms.   metoprolol tartrate 50 MG tablet Commonly known as: LOPRESSOR Take 1 tablet (50 mg total) by mouth 2 (two) times daily.   mirtazapine 7.5 MG tablet Commonly known as: REMERON Take 1 tablet (7.5 mg total) by mouth at bedtime.   multivitamin tablet Take 1 tablet by mouth daily.   oxybutynin 10 MG 24 hr tablet Commonly known as: DITROPAN-XL Take 10 mg by mouth daily.   pantoprazole 40 MG  tablet Commonly known as: PROTONIX Take 1 tablet (40 mg total) by mouth at bedtime.   PROBIOTIC PO Take 1 tablet by mouth daily.   tamsulosin 0.4 MG Caps capsule Commonly known as: FLOMAX Take 0.4 mg by mouth daily.   traMADol 50 MG tablet Commonly known as: ULTRAM Take 1 tablet (50 mg total) by mouth every 6 (six) hours as needed. 90 day supply please   VITAMIN B-12 PO Take 5,000 mcg by mouth daily.       Allergies: No Known Allergies  Past Medical History, Surgical history, Social history, and Family History were reviewed and updated.  Review of Systems: Review of Systems  Constitutional: Positive for malaise/fatigue.  HENT: Negative.   Eyes: Negative.   Respiratory: Positive for shortness of breath.   Cardiovascular: Negative.   Gastrointestinal: Positive for nausea.  Genitourinary: Negative.   Musculoskeletal: Positive for back pain and joint pain.  Skin: Negative.   Neurological: Positive for weakness.  Endo/Heme/Allergies: Bruises/bleeds easily.  Psychiatric/Behavioral: Negative.      Physical Exam:  vitals were not taken for this visit.   Wt Readings from Last 3 Encounters:  09/29/19 191 lb 1.9 oz (86.7 kg)  09/21/19 191 lb (86.6 kg)  09/07/19 194 lb (88 kg)    His vital signs are temperature of 97.8.  Pulse 71.  Blood pressure 123/65.  Weight is 198 pounds.  Physical Exam Vitals reviewed.  HENT:     Head: Normocephalic and atraumatic.  Eyes:     Pupils: Pupils are equal, round, and reactive to light.  Cardiovascular:     Rate and Rhythm: Normal rate and regular rhythm.     Heart sounds: Normal heart sounds.  Pulmonary:     Effort: Pulmonary effort is normal.     Breath sounds: Normal breath sounds.  Abdominal:     General: Bowel sounds are normal.     Palpations: Abdomen is soft.  Musculoskeletal:        General: No tenderness or deformity. Normal range of motion.     Cervical back: Normal range of motion.  Lymphadenopathy:     Cervical:  No cervical adenopathy.  Skin:    General: Skin is warm and dry.     Findings: No erythema or rash.  Neurological:     Mental Status: He is alert and oriented to person, place, and time.  Psychiatric:        Behavior: Behavior normal.        Thought Content: Thought content normal.        Judgment: Judgment normal.      Lab Results  Component Value Date   WBC 1.0 (L) 10/06/2019  HGB 6.7 (LL) 10/06/2019   HCT 19.9 (L) 10/06/2019   MCV 96.6 10/06/2019   PLT 38 (L) 10/06/2019   Lab Results  Component Value Date   FERRITIN 468 (H) 07/21/2019   IRON 195 (H) 07/21/2019   TIBC 235 07/21/2019   UIBC 40 (L) 07/21/2019   IRONPCTSAT 83 (H) 07/21/2019   Lab Results  Component Value Date   RETICCTPCT 2.1 09/21/2019   RBC 2.06 (L) 10/06/2019   Lab Results  Component Value Date   KPAFRELGTCHN 3,169.0 (H) 09/29/2019   LAMBDASER 4.8 (L) 09/29/2019   KAPLAMBRATIO 660.21 (H) 09/29/2019   Lab Results  Component Value Date   IGGSERUM 101 (L) 09/29/2019   IGA 2,656 (H) 09/29/2019   IGMSERUM <5 (L) 09/29/2019   Lab Results  Component Value Date   TOTALPROTELP 8.0 09/29/2019   ALBUMINELP 3.7 09/29/2019   A1GS 0.3 09/29/2019   A2GS 0.9 09/29/2019   BETS 2.5 (H) 09/29/2019   BETA2SER 2.0 (H) 12/08/2014   GAMS 0.6 09/29/2019   MSPIKE 1.0 (H) 09/29/2019   SPEI Comment 06/30/2019     Chemistry      Component Value Date/Time   NA 138 10/06/2019 1009   NA 141 01/21/2017 0741   NA 139 06/14/2016 1246   K 4.1 10/06/2019 1009   K 4.0 01/21/2017 0741   K 3.8 06/14/2016 1246   CL 101 10/06/2019 1009   CL 103 01/21/2017 0741   CO2 26 10/06/2019 1009   CO2 30 01/21/2017 0741   CO2 24 06/14/2016 1246   BUN 23 10/06/2019 1009   BUN 20 01/21/2017 0741   BUN 15.0 06/14/2016 1246   CREATININE 0.93 10/06/2019 1009   CREATININE 1.0 01/21/2017 0741   CREATININE 0.9 06/14/2016 1246      Component Value Date/Time   CALCIUM 9.6 10/06/2019 1009   CALCIUM 9.0 01/21/2017 0741    CALCIUM 9.6 06/14/2016 1246   ALKPHOS 48 09/29/2019 0951   ALKPHOS 37 01/21/2017 0741   ALKPHOS 54 06/14/2016 1246   AST 29 09/29/2019 0951   AST 25 06/14/2016 1246   ALT 14 09/29/2019 0951   ALT 29 01/21/2017 0741   ALT 21 06/14/2016 1246   BILITOT 0.5 09/29/2019 0951   BILITOT 0.37 06/14/2016 1246       Impression and Plan: Gene Hill is a very pleasant 79 yo caucasian gentleman with history of IgA kappa myeloma, high risk cytogenetics with a 17p- abnormality.   Actually, I am surprised that he is done so well despite having the 17p-abnormality.  I we will try him on bendamustine.  Again I realize that his blood counts not that great.  I do still think that we have many other options for him.  He may need to have colony-stimulating factors in order to get his blood counts up.  We will have to watch his blood counts weekly.  We will get him back next week.  I will put the orders in.  Hopefully, everything will be approved.    [name], MD 9/15/20211:29 PM

## 2019-10-07 NOTE — Progress Notes (Signed)
Pharmacist Chemotherapy Monitoring - Initial Assessment    Anticipated start date: 10/12/20  Regimen:  . Are orders appropriate based on the patient's diagnosis, regimen, and cycle? Yes . Does the plan date match the patient's scheduled date? Yes . Is the sequencing of drugs appropriate? Yes . Are the premedications appropriate for the patient's regimen? Yes . Prior Authorization for treatment is: Not Started o If applicable, is the correct biosimilar selected based on the patient's insurance? not applicable  Organ Function and Labs: Marland Kitchen Are dose adjustments needed based on the patient's renal function, hepatic function, or hematologic function? No . Are appropriate labs ordered prior to the start of patient's treatment? Yes . Other organ system assessment, if indicated: N/A . The following baseline labs, if indicated, have been ordered: N/A  Dose Assessment: . Are the drug doses appropriate? Yes . Are the following correct: o Drug concentrations Yes o IV fluid compatible with drug Yes o Administration routes Yes o Timing of therapy No (YES) . If applicable, does the patient have documented access for treatment and/or plans for port-a-cath placement? yes . If applicable, have lifetime cumulative doses been properly documented and assessed? not applicable Lifetime Dose Tracking  No doses have been documented on this patient for the following tracked chemicals: Doxorubicin, Epirubicin, Idarubicin, Daunorubicin, Mitoxantrone, Bleomycin, Oxaliplatin, Carboplatin, Liposomal Doxorubicin  o   Toxicity Monitoring/Prevention: . The patient has the following take home antiemetics prescribed: Ondansetron, Prochlorperazine and Lorazepam . The patient has the following take home medications prescribed: N/A . Medication allergies and previous infusion related reactions, if applicable, have been reviewed and addressed. No . The patient's current medication list has been assessed for drug-drug  interactions with their chemotherapy regimen. no significant drug-drug interactions were identified on review.  Order Review: . Are the treatment plan orders signed? Yes . Is the patient scheduled to see a provider prior to their treatment? Yes  I verify that I have reviewed each item in the above checklist and answered each question accordingly.  Gene Hill, Gene Hill 10/07/2019 3:02 PM

## 2019-10-08 DIAGNOSIS — R350 Frequency of micturition: Secondary | ICD-10-CM | POA: Diagnosis not present

## 2019-10-08 LAB — BPAM RBC
Blood Product Expiration Date: 202109222359
Blood Product Expiration Date: 202109222359
ISSUE DATE / TIME: 202109150807
ISSUE DATE / TIME: 202109150807
Unit Type and Rh: 6200
Unit Type and Rh: 6200

## 2019-10-08 LAB — TYPE AND SCREEN
ABO/RH(D): A POS
Antibody Screen: NEGATIVE
Unit division: 0
Unit division: 0

## 2019-10-12 ENCOUNTER — Telehealth: Payer: Self-pay | Admitting: *Deleted

## 2019-10-12 NOTE — Telephone Encounter (Signed)
Message received from patient's wife wanting to know if patient should continue with MRI of cervical/spine this week ordered by [facility].  Call placed back to patient's wife and patient's wife notified per order of Dr. Marin Olp that it is ok to cancel MRI of the cervical/spine.  Pt.'s wife is appreciative of call back and has no further questions at this time.

## 2019-10-13 ENCOUNTER — Telehealth: Payer: Self-pay | Admitting: *Deleted

## 2019-10-13 ENCOUNTER — Inpatient Hospital Stay: Payer: PPO

## 2019-10-13 ENCOUNTER — Inpatient Hospital Stay (HOSPITAL_BASED_OUTPATIENT_CLINIC_OR_DEPARTMENT_OTHER): Payer: PPO | Admitting: Hematology & Oncology

## 2019-10-13 ENCOUNTER — Encounter: Payer: Self-pay | Admitting: Hematology & Oncology

## 2019-10-13 ENCOUNTER — Other Ambulatory Visit: Payer: Self-pay

## 2019-10-13 ENCOUNTER — Other Ambulatory Visit: Payer: Self-pay | Admitting: *Deleted

## 2019-10-13 VITALS — BP 131/65 | HR 82 | Temp 98.0°F | Resp 20

## 2019-10-13 DIAGNOSIS — C9000 Multiple myeloma not having achieved remission: Secondary | ICD-10-CM

## 2019-10-13 DIAGNOSIS — M546 Pain in thoracic spine: Secondary | ICD-10-CM

## 2019-10-13 DIAGNOSIS — Z5111 Encounter for antineoplastic chemotherapy: Secondary | ICD-10-CM | POA: Diagnosis not present

## 2019-10-13 DIAGNOSIS — D649 Anemia, unspecified: Secondary | ICD-10-CM

## 2019-10-13 DIAGNOSIS — Z9484 Stem cells transplant status: Secondary | ICD-10-CM

## 2019-10-13 DIAGNOSIS — Z9481 Bone marrow transplant status: Secondary | ICD-10-CM

## 2019-10-13 LAB — CBC WITH DIFFERENTIAL (CANCER CENTER ONLY)
Abs Immature Granulocytes: 0.02 10*3/uL (ref 0.00–0.07)
Basophils Absolute: 0 10*3/uL (ref 0.0–0.1)
Basophils Relative: 1 %
Eosinophils Absolute: 0 10*3/uL (ref 0.0–0.5)
Eosinophils Relative: 1 %
HCT: 21.5 % — ABNORMAL LOW (ref 39.0–52.0)
Hemoglobin: 7.2 g/dL — ABNORMAL LOW (ref 13.0–17.0)
Immature Granulocytes: 2 %
Lymphocytes Relative: 24 %
Lymphs Abs: 0.3 10*3/uL — ABNORMAL LOW (ref 0.7–4.0)
MCH: 32.4 pg (ref 26.0–34.0)
MCHC: 33.5 g/dL (ref 30.0–36.0)
MCV: 96.8 fL (ref 80.0–100.0)
Monocytes Absolute: 0.3 10*3/uL (ref 0.1–1.0)
Monocytes Relative: 23 %
Neutro Abs: 0.5 10*3/uL — ABNORMAL LOW (ref 1.7–7.7)
Neutrophils Relative %: 49 %
Platelet Count: 19 10*3/uL — ABNORMAL LOW (ref 150–400)
RBC: 2.22 MIL/uL — ABNORMAL LOW (ref 4.22–5.81)
RDW: 17.9 % — ABNORMAL HIGH (ref 11.5–15.5)
WBC Count: 1.1 10*3/uL — ABNORMAL LOW (ref 4.0–10.5)
nRBC: 0 % (ref 0.0–0.2)

## 2019-10-13 LAB — CMP (CANCER CENTER ONLY)
ALT: 12 U/L (ref 0–44)
AST: 22 U/L (ref 15–41)
Albumin: 3.7 g/dL (ref 3.5–5.0)
Alkaline Phosphatase: 49 U/L (ref 38–126)
Anion gap: 11 (ref 5–15)
BUN: 22 mg/dL (ref 8–23)
CO2: 25 mmol/L (ref 22–32)
Calcium: 9.3 mg/dL (ref 8.9–10.3)
Chloride: 103 mmol/L (ref 98–111)
Creatinine: 1 mg/dL (ref 0.61–1.24)
GFR, Est AFR Am: >60 mL/min (ref >60)
GFR, Estimated: >60 mL/min (ref >60)
Glucose, Bld: 94 mg/dL (ref 70–99)
Potassium: 3.8 mmol/L (ref 3.5–5.1)
Sodium: 139 mmol/L (ref 135–145)
Total Bilirubin: 0.5 mg/dL (ref 0.3–1.2)
Total Protein: 8 g/dL (ref 6.5–8.1)

## 2019-10-13 LAB — SAMPLE TO BLOOD BANK

## 2019-10-13 LAB — PREPARE RBC (CROSSMATCH)

## 2019-10-13 MED ORDER — HEPARIN SOD (PORK) LOCK FLUSH 100 UNIT/ML IV SOLN
500.0000 [IU] | Freq: Once | INTRAVENOUS | Status: AC | PRN
  Administered 2019-10-13: 500 [IU]
  Filled 2019-10-13: qty 5

## 2019-10-13 MED ORDER — SODIUM CHLORIDE 0.9% FLUSH
10.0000 mL | INTRAVENOUS | Status: DC | PRN
  Administered 2019-10-13: 10 mL
  Filled 2019-10-13: qty 10

## 2019-10-13 MED ORDER — SODIUM CHLORIDE 0.9 % IV SOLN
80.0000 mg/m2 | Freq: Once | INTRAVENOUS | Status: AC
  Administered 2019-10-13: 150 mg via INTRAVENOUS
  Filled 2019-10-13: qty 6

## 2019-10-13 MED ORDER — ACETAMINOPHEN 325 MG PO TABS
ORAL_TABLET | ORAL | Status: AC
  Filled 2019-10-13: qty 2

## 2019-10-13 MED ORDER — PALONOSETRON HCL INJECTION 0.25 MG/5ML
0.2500 mg | Freq: Once | INTRAVENOUS | Status: AC
  Administered 2019-10-13: 0.25 mg via INTRAVENOUS

## 2019-10-13 MED ORDER — DENOSUMAB 120 MG/1.7ML ~~LOC~~ SOLN
SUBCUTANEOUS | Status: AC
  Filled 2019-10-13: qty 1.7

## 2019-10-13 MED ORDER — DIPHENHYDRAMINE HCL 25 MG PO CAPS
ORAL_CAPSULE | ORAL | Status: AC
  Filled 2019-10-13: qty 1

## 2019-10-13 MED ORDER — DENOSUMAB 120 MG/1.7ML ~~LOC~~ SOLN
120.0000 mg | Freq: Once | SUBCUTANEOUS | Status: AC
  Administered 2019-10-13: 120 mg via SUBCUTANEOUS

## 2019-10-13 MED ORDER — PALONOSETRON HCL INJECTION 0.25 MG/5ML
INTRAVENOUS | Status: AC
  Filled 2019-10-13: qty 5

## 2019-10-13 MED ORDER — SODIUM CHLORIDE 0.9 % IV SOLN
20.0000 mg | Freq: Once | INTRAVENOUS | Status: AC
  Administered 2019-10-13: 20 mg via INTRAVENOUS
  Filled 2019-10-13: qty 2

## 2019-10-13 MED ORDER — SODIUM CHLORIDE 0.9 % IV SOLN
Freq: Once | INTRAVENOUS | Status: AC
  Filled 2019-10-13: qty 250

## 2019-10-13 NOTE — Telephone Encounter (Signed)
Dr. Marin Olp notified of HGB-7.2, WBC-1.1 and platelet count-19.  Order received from Dr. Marin Olp for pt to get one unit of platelets, two units of blood and neupogen or neulasta the day after Bendamustine.  Message sent to scheduling.

## 2019-10-13 NOTE — Patient Instructions (Signed)

## 2019-10-13 NOTE — Progress Notes (Addendum)
Hematology and Oncology Follow Up Visit  Gene Hill [id number] 08-13-1940 79 y.o. 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, +11, 13q- and 17p-)  Past Therapy: Pomalyst stopped 12/25/2016 Daratumumab q 4 week dosing -changed on 10/28/2017 - s/p cycle 35  Carfilzomib -- s/p cycle #1 on 04/07/2019 (3 wk on/1 off)  Current Therapy: Selinexor 100 mg po q day - start 05/25/2019 -- d/c on 06/29/2019 Velcade 1.3 mg/m2 sq q week -- start on 05/03/202 - d/c on 06/2019 Melflufen 40 mg IV q month -status post cycle #1 - started on 08/24/2019 -- d/coc on 10/06/2019 Bendamustine 80 mg/m2 day 1,2 -- start cycle #1 on 10/13/2019 Xgeva 120 m sq q 3 months - next dose 12/2019   Interim History:  Gene Hill is here today for an early appointment.  Unfortunately, it is so it looks like the Melflufen is not working.  His kappa light chain has gone up quite quickly.  His kappa light chain we last checked over a week ago was almost 3200 mg/L.  Again I think this is an indicator that things are not improving.  His IgA level was 2650 mg/dL.  I just hate that we are losing options for him.  He still is in pretty good shape.  Unfortunate, his bone marrow is having a hard time.  I think the bone marrow is probably infiltrated with myeloma and it is not able to make normal blood cells.  Our options are limited.  I think we could consider him for bendamustine.  I think this would be reasonable.  It would be tolerable.  The other option would be Blenrep.  The problem with this is all the eye exams that patients needed.  We clearly are talking about quality of life for Gene Hill.  He is a was valued quality of life.  He has a nice garden that he helps provide vegetables for church members.  He is getting blood today.  He does feel tired.  However, he does well with transfusions.  He has had no problems with bowels or bladder.  His appetite has been  okay.  He has had no nausea or vomiting.  Overall, I would say his performance status is probably ECOG 1.    Medications:  Allergies as of 10/13/2019   No Known Allergies     Medication List       Accurate as of October 13, 2019 10:47 AM. If you have any questions, ask your nurse or doctor.        acetaminophen 500 MG tablet Commonly known as: TYLENOL Take 1,000 mg by mouth 3 (three) times daily as needed.   aspirin 325 MG tablet Take 325 mg by mouth daily.   b complex vitamins tablet Take 1 tablet by mouth daily.   calcium carbonate 1500 (600 Ca) MG Tabs tablet Commonly known as: OSCAL Take 600 mg of elemental calcium by mouth daily with breakfast.   dronabinol 2.5 MG capsule Commonly known as: MARINOL TAKE 1 CAPSULE(2.5 MG) BY MOUTH TWICE DAILY BEFORE A MEAL   famciclovir 250 MG tablet Commonly known as: FAMVIR Take 1 tablet (250 mg total) by mouth daily.   Fish Oil 1200 MG Caps Take 1,200 mg by mouth daily.   methocarbamol 500 MG tablet Commonly known as: ROBAXIN Take 1 tablet (500 mg total) by mouth 3 (three) times daily as needed for muscle spasms.   metoprolol tartrate 50 MG tablet Commonly known as:  LOPRESSOR Take 1 tablet (50 mg total) by mouth 2 (two) times daily.   mirtazapine 7.5 MG tablet Commonly known as: REMERON Take 1 tablet (7.5 mg total) by mouth at bedtime.   multivitamin tablet Take 1 tablet by mouth daily.   oxybutynin 10 MG 24 hr tablet Commonly known as: DITROPAN-XL Take 10 mg by mouth daily.   pantoprazole 40 MG tablet Commonly known as: PROTONIX Take 1 tablet (40 mg total) by mouth at bedtime.   PROBIOTIC PO Take 1 tablet by mouth daily.   tamsulosin 0.4 MG Caps capsule Commonly known as: FLOMAX Take 0.4 mg by mouth daily.   traMADol 50 MG tablet Commonly known as: ULTRAM Take 1 tablet (50 mg total) by mouth every 6 (six) hours as needed. 90 day supply please   VITAMIN B-12 PO Take 5,000 mcg by mouth daily.        Allergies: No Known Allergies  Past Medical History, Surgical history, Social history, and Family History were reviewed and updated.  Review of Systems: Review of Systems  Constitutional: Positive for malaise/fatigue.  HENT: Negative.   Eyes: Negative.   Respiratory: Positive for shortness of breath.   Cardiovascular: Negative.   Gastrointestinal: Positive for nausea.  Genitourinary: Negative.   Musculoskeletal: Positive for back pain and joint pain.  Skin: Negative.   Neurological: Positive for weakness.  Endo/Heme/Allergies: Bruises/bleeds easily.  Psychiatric/Behavioral: Negative.      Physical Exam:  vitals were not taken for this visit.   Wt Readings from Last 3 Encounters:  10/13/19 191 lb 6.4 oz (86.8 kg)  09/29/19 191 lb 1.9 oz (86.7 kg)  09/21/19 191 lb (86.6 kg)    His vital signs are temperature of 97.8.  Pulse 71.  Blood pressure 123/65.  Weight is 198 pounds.  Physical Exam Vitals reviewed.  HENT:     Head: Normocephalic and atraumatic.  Eyes:     Pupils: Pupils are equal, round, and reactive to light.  Cardiovascular:     Rate and Rhythm: Normal rate and regular rhythm.     Heart sounds: Normal heart sounds.  Pulmonary:     Effort: Pulmonary effort is normal.     Breath sounds: Normal breath sounds.  Abdominal:     General: Bowel sounds are normal.     Palpations: Abdomen is soft.  Musculoskeletal:        General: No tenderness or deformity. Normal range of motion.     Cervical back: Normal range of motion.  Lymphadenopathy:     Cervical: No cervical adenopathy.  Skin:    General: Skin is warm and dry.     Findings: No erythema or rash.  Neurological:     Mental Status: He is alert and oriented to person, place, and time.  Psychiatric:        Behavior: Behavior normal.        Thought Content: Thought content normal.        Judgment: Judgment normal.      Lab Results  Component Value Date   WBC 1.1 (L) 10/13/2019   HGB 7.2 (L)  10/13/2019   HCT 21.5 (L) 10/13/2019   MCV 96.8 10/13/2019   PLT 19 (L) 10/13/2019   Lab Results  Component Value Date   FERRITIN 468 (H) 07/21/2019   IRON 195 (H) 07/21/2019   TIBC 235 07/21/2019   UIBC 40 (L) 07/21/2019   IRONPCTSAT 83 (H) 07/21/2019   Lab Results  Component Value Date   RETICCTPCT 2.1 09/21/2019  RBC 2.22 (L) 10/13/2019   Lab Results  Component Value Date   KPAFRELGTCHN 3,169.0 (H) 09/29/2019   LAMBDASER 4.8 (L) 09/29/2019   KAPLAMBRATIO 660.21 (H) 09/29/2019   Lab Results  Component Value Date   IGGSERUM 101 (L) 09/29/2019   IGA 2,656 (H) 09/29/2019   IGMSERUM <5 (L) 09/29/2019   Lab Results  Component Value Date   TOTALPROTELP 8.0 09/29/2019   ALBUMINELP 3.7 09/29/2019   A1GS 0.3 09/29/2019   A2GS 0.9 09/29/2019   BETS 2.5 (H) 09/29/2019   BETA2SER 2.0 (H) 12/08/2014   GAMS 0.6 09/29/2019   MSPIKE 1.0 (H) 09/29/2019   SPEI Comment 06/30/2019     Chemistry      Component Value Date/Time   NA 139 10/13/2019 0940   NA 141 01/21/2017 0741   NA 139 06/14/2016 1246   K 3.8 10/13/2019 0940   K 4.0 01/21/2017 0741   K 3.8 06/14/2016 1246   CL 103 10/13/2019 0940   CL 103 01/21/2017 0741   CO2 25 10/13/2019 0940   CO2 30 01/21/2017 0741   CO2 24 06/14/2016 1246   BUN 22 10/13/2019 0940   BUN 20 01/21/2017 0741   BUN 15.0 06/14/2016 1246   CREATININE 1.00 10/13/2019 0940   CREATININE 1.0 01/21/2017 0741   CREATININE 0.9 06/14/2016 1246      Component Value Date/Time   CALCIUM 9.3 10/13/2019 0940   CALCIUM 9.0 01/21/2017 0741   CALCIUM 9.6 06/14/2016 1246   ALKPHOS 49 10/13/2019 0940   ALKPHOS 37 01/21/2017 0741   ALKPHOS 54 06/14/2016 1246   AST 22 10/13/2019 0940   AST 25 06/14/2016 1246   ALT 12 10/13/2019 0940   ALT 29 01/21/2017 0741   ALT 21 06/14/2016 1246   BILITOT 0.5 10/13/2019 0940   BILITOT 0.37 06/14/2016 1246       Impression and Plan: Gene Hill is a very pleasant 79 yo caucasian gentleman with history of IgA  kappa myeloma, high risk cytogenetics with a 17p- abnormality.   Actually, I am surprised that he is done so well despite having the 17p-abnormality.  I we will try him on bendamustine.  Again I realize that his blood counts not that great.  I do still think that we have many other options for him.  We will go ahead and give him Zometa today.  He may need to have colony-stimulating factors in order to get his blood counts up.  We will have to watch his blood counts weekly.  We will get him back next week for labs.[name], MD 9/21/202110:47 AM

## 2019-10-13 NOTE — Patient Instructions (Signed)
Thrombocytopenia Thrombocytopenia means that you have a low number of platelets in your blood. Platelets are tiny cells in the blood. When you bleed, they clump together at the cut or injury to stop the bleeding. This is called blood clotting. If you do not have enough platelets, it can cause bleeding problems. Some cases of this condition are mild while others are more severe. What are the causes? This condition may be caused by:  Your body not making enough platelets. This may be caused by: ? Your bone marrow not making blood cells (aplastic anemia). ? Cancer in the bone marrow. ? Certain medicines. ? Infection in the bone marrow. ? Drinking a lot of alcohol.  Your body destroying platelets too quickly. This may be caused by: ? Certain immune diseases. ? Certain medicines. ? Certain blood clotting disorders. ? Certain disorders that are passed from parent to child (inherited). ? Certain bleeding disorders. ? Pregnancy. ? Having a spleen that is larger than normal. What are the signs or symptoms?  Bleeding that is not normal.  Nosebleeds.  Heavy menstrual periods.  Blood in the pee (urine) or poop (stool).  A purple-like color to the skin (purpura).  Bruising.  A rash that looks like pinpoint, purple-red spots (petechiae). How is this treated?  Treatment of another condition that is causing the low platelet count.  Medicines to help protect your platelets from being destroyed.  A replacement (transfusion) of platelets to stop or prevent bleeding.  Surgery to remove the spleen. Follow these instructions at home: Activity  Avoid activities that could cause you to get hurt or bruised. Follow instructions about how to prevent falls.  Take care not to cut yourself: ? When you shave. ? When you use scissors, needles, knives, or other tools.  Take care not to burn yourself: ? When you use an iron. ? When you cook. General instructions   Check your skin and the  inside of your mouth for bruises or blood as told by your doctor.  Check to see if there is blood in your spit (sputum), pee, and poop. Do this as told by your doctor.  Do not drink alcohol.  Take over-the-counter and prescription medicines only as told by your doctor.  Do not take any medicines that have aspirin or NSAIDs in them. These medicines can thin your blood and cause you to bleed.  Tell all of your doctors that you have this condition. Be sure to tell your dentist and eye doctor too. Contact a doctor if:  You have bruises and you do not know why. Get help right away if:  You are bleeding anywhere on your body.  You have blood in your spit, pee, or poop. Summary  Thrombocytopenia means that you have a low number of platelets in your blood.  Platelets are needed for blood clotting.  Symptoms of this condition include bleeding that is not normal, and bruising.  Take care not to cut or burn yourself. This information is not intended to replace advice given to you by your health care provider. Make sure you discuss any questions you have with your health care provider. Document Revised: 10/10/2017 Document Reviewed: 10/10/2017 Elsevier Patient Education  2020 [employer] Inc.  

## 2019-10-13 NOTE — Progress Notes (Signed)
OK to treat with ANC-0.5, HGB-7.2 and platelet count-19 per order of Dr. Marin Olp.

## 2019-10-14 ENCOUNTER — Inpatient Hospital Stay: Payer: PPO

## 2019-10-14 ENCOUNTER — Other Ambulatory Visit: Payer: Self-pay | Admitting: Hematology & Oncology

## 2019-10-14 VITALS — BP 150/59 | HR 82 | Temp 97.8°F | Resp 17

## 2019-10-14 DIAGNOSIS — C9000 Multiple myeloma not having achieved remission: Secondary | ICD-10-CM

## 2019-10-14 DIAGNOSIS — Z5111 Encounter for antineoplastic chemotherapy: Secondary | ICD-10-CM | POA: Diagnosis not present

## 2019-10-14 LAB — PREPARE PLATELET PHERESIS: Unit division: 0

## 2019-10-14 LAB — KAPPA/LAMBDA LIGHT CHAINS
Kappa free light chain: 2535.9 mg/L — ABNORMAL HIGH (ref 3.3–19.4)
Kappa, lambda light chain ratio: 539.55 — ABNORMAL HIGH (ref 0.26–1.65)
Lambda free light chains: 4.7 mg/L — ABNORMAL LOW (ref 5.7–26.3)

## 2019-10-14 LAB — BPAM PLATELET PHERESIS
Blood Product Expiration Date: 202109232359
ISSUE DATE / TIME: 202109211025
Unit Type and Rh: 7300

## 2019-10-14 MED ORDER — HEPARIN SOD (PORK) LOCK FLUSH 100 UNIT/ML IV SOLN
500.0000 [IU] | Freq: Once | INTRAVENOUS | Status: DC | PRN
  Filled 2019-10-14: qty 5

## 2019-10-14 MED ORDER — SODIUM CHLORIDE 0.9% IV SOLUTION
250.0000 mL | Freq: Once | INTRAVENOUS | Status: DC
  Filled 2019-10-14: qty 250

## 2019-10-14 MED ORDER — SODIUM CHLORIDE 0.9 % IV SOLN
80.0000 mg/m2 | Freq: Once | INTRAVENOUS | Status: AC
  Administered 2019-10-14: 150 mg via INTRAVENOUS
  Filled 2019-10-14: qty 6

## 2019-10-14 MED ORDER — HEPARIN SOD (PORK) LOCK FLUSH 100 UNIT/ML IV SOLN
500.0000 [IU] | Freq: Once | INTRAVENOUS | Status: AC
  Administered 2019-10-14: 500 [IU] via INTRAVENOUS
  Filled 2019-10-14: qty 5

## 2019-10-14 MED ORDER — SODIUM CHLORIDE 0.9 % IV SOLN
10.0000 mg | Freq: Once | INTRAVENOUS | Status: AC
  Administered 2019-10-14: 10 mg via INTRAVENOUS
  Filled 2019-10-14: qty 10

## 2019-10-14 MED ORDER — SODIUM CHLORIDE 0.9 % IV SOLN
Freq: Once | INTRAVENOUS | Status: AC
  Filled 2019-10-14: qty 250

## 2019-10-14 MED ORDER — FUROSEMIDE 10 MG/ML IJ SOLN: 20.0000 mg | Freq: Once | INTRAMUSCULAR | Status: DC

## 2019-10-14 MED ORDER — SODIUM CHLORIDE 0.9% FLUSH
10.0000 mL | Freq: Once | INTRAVENOUS | Status: AC
  Administered 2019-10-14: 10 mL via INTRAVENOUS
  Filled 2019-10-14: qty 10

## 2019-10-14 MED ORDER — SODIUM CHLORIDE 0.9% FLUSH
10.0000 mL | INTRAVENOUS | Status: DC | PRN
  Filled 2019-10-14: qty 10

## 2019-10-15 ENCOUNTER — Other Ambulatory Visit: Payer: Self-pay

## 2019-10-15 ENCOUNTER — Other Ambulatory Visit: Payer: Self-pay | Admitting: Hematology & Oncology

## 2019-10-15 ENCOUNTER — Other Ambulatory Visit: Payer: PPO

## 2019-10-15 ENCOUNTER — Inpatient Hospital Stay: Payer: PPO

## 2019-10-15 VITALS — BP 146/78 | HR 70 | Temp 98.4°F | Resp 16

## 2019-10-15 DIAGNOSIS — C9000 Multiple myeloma not having achieved remission: Secondary | ICD-10-CM

## 2019-10-15 DIAGNOSIS — Z5111 Encounter for antineoplastic chemotherapy: Secondary | ICD-10-CM | POA: Diagnosis not present

## 2019-10-15 LAB — IMMUNOFIXATION REFLEX, SERUM
IgA: 2853 mg/dL — ABNORMAL HIGH (ref 61–437)
IgG (Immunoglobin G), Serum: 92 mg/dL — ABNORMAL LOW (ref 603–1613)
IgM (Immunoglobulin M), Srm: <5 mg/dL — ABNORMAL LOW (ref 15–143)

## 2019-10-15 LAB — PROTEIN ELECTROPHORESIS, SERUM, WITH REFLEX
A/G Ratio: 0.8 (ref 0.7–1.7)
Albumin ELP: 3.4 g/dL (ref 2.9–4.4)
Alpha-1-Globulin: 0.2 g/dL (ref 0.0–0.4)
Alpha-2-Globulin: 0.8 g/dL (ref 0.4–1.0)
Beta Globulin: 1.2 g/dL (ref 0.7–1.3)
Gamma Globulin: 2.1 g/dL — ABNORMAL HIGH (ref 0.4–1.8)
Globulin, Total: 4.4 g/dL — ABNORMAL HIGH (ref 2.2–3.9)
M-Spike, %: 1 g/dL — ABNORMAL HIGH
SPEP Interpretation: 0
Total Protein ELP: 7.8 g/dL (ref 6.0–8.5)

## 2019-10-15 LAB — BPAM RBC
Blood Product Expiration Date: 202109242359
Blood Product Expiration Date: 202110072359
ISSUE DATE / TIME: 202109220801
ISSUE DATE / TIME: 202109220801
Unit Type and Rh: 6200
Unit Type and Rh: 6200

## 2019-10-15 LAB — TYPE AND SCREEN
ABO/RH(D): A POS
Antibody Screen: NEGATIVE
Unit division: 0
Unit division: 0

## 2019-10-15 LAB — IGG, IGA, IGM
IgA: 2939 mg/dL — ABNORMAL HIGH (ref 61–437)
IgG (Immunoglobin G), Serum: 99 mg/dL — ABNORMAL LOW (ref 603–1613)
IgM (Immunoglobulin M), Srm: <5 mg/dL — ABNORMAL LOW (ref 15–143)

## 2019-10-15 MED ORDER — PEGFILGRASTIM-JMDB 6 MG/0.6ML ~~LOC~~ SOSY
6.0000 mg | PREFILLED_SYRINGE | Freq: Once | SUBCUTANEOUS | Status: AC
  Administered 2019-10-15: 6 mg via SUBCUTANEOUS

## 2019-10-15 NOTE — Patient Instructions (Signed)

## 2019-10-16 DIAGNOSIS — R350 Frequency of micturition: Secondary | ICD-10-CM | POA: Diagnosis not present

## 2019-10-20 ENCOUNTER — Other Ambulatory Visit: Payer: PPO

## 2019-10-20 ENCOUNTER — Other Ambulatory Visit: Payer: Self-pay | Admitting: *Deleted

## 2019-10-20 ENCOUNTER — Telehealth: Payer: Self-pay | Admitting: *Deleted

## 2019-10-20 ENCOUNTER — Ambulatory Visit: Payer: PPO | Admitting: Hematology & Oncology

## 2019-10-20 ENCOUNTER — Other Ambulatory Visit: Payer: Self-pay | Admitting: Family

## 2019-10-20 ENCOUNTER — Inpatient Hospital Stay: Payer: PPO

## 2019-10-20 ENCOUNTER — Other Ambulatory Visit: Payer: Self-pay

## 2019-10-20 ENCOUNTER — Ambulatory Visit: Payer: PPO

## 2019-10-20 DIAGNOSIS — I10 Essential (primary) hypertension: Secondary | ICD-10-CM | POA: Diagnosis not present

## 2019-10-20 DIAGNOSIS — L89896 Pressure-induced deep tissue damage of other site: Secondary | ICD-10-CM | POA: Diagnosis present

## 2019-10-20 DIAGNOSIS — Z515 Encounter for palliative care: Secondary | ICD-10-CM | POA: Diagnosis not present

## 2019-10-20 DIAGNOSIS — A411 Sepsis due to other specified staphylococcus: Secondary | ICD-10-CM | POA: Diagnosis present

## 2019-10-20 DIAGNOSIS — Z6835 Body mass index (BMI) 35.0-35.9, adult: Secondary | ICD-10-CM | POA: Diagnosis not present

## 2019-10-20 DIAGNOSIS — N400 Enlarged prostate without lower urinary tract symptoms: Secondary | ICD-10-CM | POA: Diagnosis present

## 2019-10-20 DIAGNOSIS — R531 Weakness: Secondary | ICD-10-CM | POA: Diagnosis not present

## 2019-10-20 DIAGNOSIS — Z7189 Other specified counseling: Secondary | ICD-10-CM | POA: Diagnosis not present

## 2019-10-20 DIAGNOSIS — D649 Anemia, unspecified: Secondary | ICD-10-CM

## 2019-10-20 DIAGNOSIS — Z87891 Personal history of nicotine dependence: Secondary | ICD-10-CM | POA: Diagnosis not present

## 2019-10-20 DIAGNOSIS — C9000 Multiple myeloma not having achieved remission: Secondary | ICD-10-CM

## 2019-10-20 DIAGNOSIS — I110 Hypertensive heart disease with heart failure: Secondary | ICD-10-CM | POA: Diagnosis present

## 2019-10-20 DIAGNOSIS — U071 COVID-19: Secondary | ICD-10-CM | POA: Diagnosis present

## 2019-10-20 DIAGNOSIS — Z66 Do not resuscitate: Secondary | ICD-10-CM | POA: Diagnosis present

## 2019-10-20 DIAGNOSIS — D709 Neutropenia, unspecified: Secondary | ICD-10-CM | POA: Diagnosis present

## 2019-10-20 DIAGNOSIS — R6521 Severe sepsis with septic shock: Secondary | ICD-10-CM | POA: Diagnosis not present

## 2019-10-20 DIAGNOSIS — K219 Gastro-esophageal reflux disease without esophagitis: Secondary | ICD-10-CM | POA: Diagnosis present

## 2019-10-20 DIAGNOSIS — Z9484 Stem cells transplant status: Secondary | ICD-10-CM | POA: Diagnosis not present

## 2019-10-20 DIAGNOSIS — F32A Depression, unspecified: Secondary | ICD-10-CM | POA: Diagnosis present

## 2019-10-20 DIAGNOSIS — J1282 Pneumonia due to coronavirus disease 2019: Secondary | ICD-10-CM | POA: Diagnosis present

## 2019-10-20 DIAGNOSIS — E876 Hypokalemia: Secondary | ICD-10-CM | POA: Diagnosis present

## 2019-10-20 DIAGNOSIS — R509 Fever, unspecified: Secondary | ICD-10-CM | POA: Diagnosis not present

## 2019-10-20 DIAGNOSIS — C9002 Multiple myeloma in relapse: Secondary | ICD-10-CM | POA: Diagnosis not present

## 2019-10-20 DIAGNOSIS — D61818 Other pancytopenia: Secondary | ICD-10-CM | POA: Diagnosis present

## 2019-10-20 DIAGNOSIS — R7989 Other specified abnormal findings of blood chemistry: Secondary | ICD-10-CM | POA: Diagnosis not present

## 2019-10-20 DIAGNOSIS — I2510 Atherosclerotic heart disease of native coronary artery without angina pectoris: Secondary | ICD-10-CM | POA: Diagnosis present

## 2019-10-20 DIAGNOSIS — B9689 Other specified bacterial agents as the cause of diseases classified elsewhere: Secondary | ICD-10-CM | POA: Diagnosis not present

## 2019-10-20 DIAGNOSIS — N390 Urinary tract infection, site not specified: Secondary | ICD-10-CM | POA: Diagnosis present

## 2019-10-20 DIAGNOSIS — R7881 Bacteremia: Secondary | ICD-10-CM | POA: Diagnosis not present

## 2019-10-20 DIAGNOSIS — I5032 Chronic diastolic (congestive) heart failure: Secondary | ICD-10-CM | POA: Diagnosis present

## 2019-10-20 DIAGNOSIS — B957 Other staphylococcus as the cause of diseases classified elsewhere: Secondary | ICD-10-CM | POA: Diagnosis not present

## 2019-10-20 DIAGNOSIS — B9562 Methicillin resistant Staphylococcus aureus infection as the cause of diseases classified elsewhere: Secondary | ICD-10-CM | POA: Diagnosis not present

## 2019-10-20 DIAGNOSIS — A403 Sepsis due to Streptococcus pneumoniae: Secondary | ICD-10-CM | POA: Diagnosis not present

## 2019-10-20 DIAGNOSIS — D849 Immunodeficiency, unspecified: Secondary | ICD-10-CM | POA: Diagnosis present

## 2019-10-20 DIAGNOSIS — Z9481 Bone marrow transplant status: Secondary | ICD-10-CM | POA: Diagnosis not present

## 2019-10-20 DIAGNOSIS — R0902 Hypoxemia: Secondary | ICD-10-CM | POA: Diagnosis not present

## 2019-10-20 DIAGNOSIS — E785 Hyperlipidemia, unspecified: Secondary | ICD-10-CM | POA: Diagnosis present

## 2019-10-20 DIAGNOSIS — J9811 Atelectasis: Secondary | ICD-10-CM | POA: Diagnosis not present

## 2019-10-20 DIAGNOSIS — A419 Sepsis, unspecified organism: Secondary | ICD-10-CM | POA: Diagnosis not present

## 2019-10-20 DIAGNOSIS — R6520 Severe sepsis without septic shock: Secondary | ICD-10-CM | POA: Diagnosis not present

## 2019-10-20 DIAGNOSIS — E669 Obesity, unspecified: Secondary | ICD-10-CM | POA: Diagnosis present

## 2019-10-20 DIAGNOSIS — I340 Nonrheumatic mitral (valve) insufficiency: Secondary | ICD-10-CM | POA: Diagnosis not present

## 2019-10-20 DIAGNOSIS — I248 Other forms of acute ischemic heart disease: Secondary | ICD-10-CM | POA: Diagnosis present

## 2019-10-20 DIAGNOSIS — R5381 Other malaise: Secondary | ICD-10-CM | POA: Diagnosis not present

## 2019-10-20 DIAGNOSIS — N178 Other acute kidney failure: Secondary | ICD-10-CM | POA: Diagnosis not present

## 2019-10-20 DIAGNOSIS — R000 Tachycardia, unspecified: Secondary | ICD-10-CM | POA: Diagnosis not present

## 2019-10-20 DIAGNOSIS — F419 Anxiety disorder, unspecified: Secondary | ICD-10-CM | POA: Diagnosis present

## 2019-10-20 LAB — CMP (CANCER CENTER ONLY)
ALT: 11 U/L (ref 0–44)
AST: 19 U/L (ref 15–41)
Albumin: 3.6 g/dL (ref 3.5–5.0)
Alkaline Phosphatase: 52 U/L (ref 38–126)
Anion gap: 11 (ref 5–15)
BUN: 24 mg/dL — ABNORMAL HIGH (ref 8–23)
CO2: 27 mmol/L (ref 22–32)
Calcium: 10.3 mg/dL (ref 8.9–10.3)
Chloride: 103 mmol/L (ref 98–111)
Creatinine: 1.13 mg/dL (ref 0.61–1.24)
GFR, Est AFR Am: >60 mL/min (ref >60)
GFR, Estimated: >60 mL/min (ref >60)
Glucose, Bld: 98 mg/dL (ref 70–99)
Potassium: 3.8 mmol/L (ref 3.5–5.1)
Sodium: 141 mmol/L (ref 135–145)
Total Bilirubin: 0.7 mg/dL (ref 0.3–1.2)
Total Protein: 8 g/dL (ref 6.5–8.1)

## 2019-10-20 LAB — CBC WITH DIFFERENTIAL (CANCER CENTER ONLY)
Abs Immature Granulocytes: 0.24 10*3/uL — ABNORMAL HIGH (ref 0.00–0.07)
Basophils Absolute: 0 10*3/uL (ref 0.0–0.1)
Basophils Relative: 1 %
Eosinophils Absolute: 0 10*3/uL (ref 0.0–0.5)
Eosinophils Relative: 1 %
HCT: 21.4 % — ABNORMAL LOW (ref 39.0–52.0)
Hemoglobin: 7.2 g/dL — ABNORMAL LOW (ref 13.0–17.0)
Immature Granulocytes: 9 %
Lymphocytes Relative: 7 %
Lymphs Abs: 0.2 10*3/uL — ABNORMAL LOW (ref 0.7–4.0)
MCH: 31.6 pg (ref 26.0–34.0)
MCHC: 33.6 g/dL (ref 30.0–36.0)
MCV: 93.9 fL (ref 80.0–100.0)
Monocytes Absolute: 0.4 10*3/uL (ref 0.1–1.0)
Monocytes Relative: 12 %
Neutro Abs: 2 10*3/uL (ref 1.7–7.7)
Neutrophils Relative %: 70 %
Platelet Count: 10 10*3/uL — ABNORMAL LOW (ref 150–400)
RBC: 2.28 MIL/uL — ABNORMAL LOW (ref 4.22–5.81)
RDW: 20.1 % — ABNORMAL HIGH (ref 11.5–15.5)
WBC Count: 2.8 10*3/uL — ABNORMAL LOW (ref 4.0–10.5)
nRBC: 0 % (ref 0.0–0.2)

## 2019-10-20 LAB — SAMPLE TO BLOOD BANK

## 2019-10-20 LAB — PREPARE RBC (CROSSMATCH)

## 2019-10-20 MED ORDER — SODIUM CHLORIDE 0.9 % IV SOLN
INTRAVENOUS | Status: DC
  Filled 2019-10-20 (×2): qty 250

## 2019-10-20 MED ORDER — ACETAMINOPHEN 325 MG PO TABS: 650.0000 mg | ORAL_TABLET | Freq: Once | ORAL | Status: DC

## 2019-10-20 MED ORDER — DIPHENHYDRAMINE HCL 25 MG PO CAPS
ORAL_CAPSULE | ORAL | Status: AC
  Filled 2019-10-20: qty 1

## 2019-10-20 MED ORDER — DIPHENHYDRAMINE HCL 25 MG PO CAPS
25.0000 mg | ORAL_CAPSULE | Freq: Once | ORAL | Status: AC
  Administered 2019-10-20: 25 mg via ORAL

## 2019-10-20 MED ORDER — ACETAMINOPHEN 325 MG PO TABS
ORAL_TABLET | ORAL | Status: AC
  Filled 2019-10-20: qty 2

## 2019-10-20 NOTE — Telephone Encounter (Signed)
Dr. Marin Olp notified of platelet count-10 and HGB-7.2.  Order received for pt to get one unit of platelets today, one unit of blood today and one unit of blood tomorrow.  Message sent to scheduling.

## 2019-10-20 NOTE — Patient Instructions (Signed)
Platelet Transfusion A platelet transfusion is a procedure in which you receive donated platelets through an IV. Platelets are tiny pieces of blood cells. When you get an injury, platelets clump together in the area to form a blood clot. This helps stop bleeding and is the beginning of the healing process. If you have too few platelets, your blood may have trouble clotting. This may cause you to bleed and bruise very easily. You may need a platelet transfusion if you have a condition that causes a low number of platelets (thrombocytopenia). A platelet transfusion may be used to stop or prevent excessive bleeding. Tell a health care provider about:  Any reactions you have had during previous transfusions.  Any allergies you have.  All medicines you are taking, including vitamins, herbs, eye drops, creams, and over-the-counter medicines.  Any blood disorders you have.  Any surgeries you have had.  Any medical conditions you have.  Whether you are pregnant or may be pregnant. What are the risks? Generally, this is a safe procedure. However, problems may occur, including:  Fever.  Infection.  Allergic reaction to the donor platelets.  Your body's disease-fighting system (immune system) attacking the donor platelets (hemolytic reaction). This is rare.  A rare reaction that causes lung damage (transfusion-related acute lung injury). What happens before the procedure? Medicines  Ask your health care provider about: ? Changing or stopping your regular medicines. This is especially important if you are taking diabetes medicines or blood thinners. ? Taking medicines such as aspirin and ibuprofen. These medicines can thin your blood. Do not take these medicines unless your health care provider tells you to take them. ? Taking over-the-counter medicines, vitamins, herbs, and supplements. General instructions  You will have a blood test to determine your blood type. Your blood type  determines what kind of platelets you will be given.  Follow instructions from your health care provider about eating or drinking restrictions.  If you have had an allergic reaction to a transfusion in the past, you may be given medicine to help prevent a reaction.  Your temperature, blood pressure, pulse, and breathing will be monitored. What happens during the procedure?   An IV will be inserted into one of your veins.  For your safety, two health care providers will verify your identity along with the donor platelets about to be infused.  A bag of donor platelets will be connected to your IV. The platelets will flow into your bloodstream. This usually takes 30-60 minutes.  Your temperature, blood pressure, pulse, and breathing will be monitored during the transfusion. This helps detect early signs of any reaction.  You will also be monitored for other symptoms that may indicate a reaction, including chills, hives, or itching.  If you have signs of a reaction at any time, your transfusion will be stopped, and you may be given medicine to help manage the reaction.  When your transfusion is complete, your IV will be removed.  Pressure may be applied to the IV site for a few minutes to stop any bleeding.  The IV site will be covered with a bandage (dressing). The procedure may vary among health care providers and hospitals. What happens after the procedure?  Your blood pressure, temperature, pulse, and breathing will be monitored until you leave the hospital or clinic.  You may have some bruising and soreness at your IV site. Follow these instructions at home: Medicines  Take over-the-counter and prescription medicines only as told by your health care provider.  Talk with your health care provider before you take any medicines that contain aspirin or NSAIDs. These medicines increase your risk for dangerous bleeding. General instructions  Change or remove your dressing as told  by your health care provider.  Return to your normal activities as told by your health care provider. Ask your health care provider what activities are safe for you.  Do not take baths, swim, or use a hot tub until your health care provider approves. Ask your health care provider if you may take showers.  Check your IV site every day for signs of infection. Check for: ? Redness, swelling, or pain. ? Fluid or blood. If fluid or blood drains from your IV site, use your hands to press down firmly on a bandage covering the area for a minute or two. Doing this should stop the bleeding. ? Warmth. ? Pus or a bad smell.  Keep all follow-up visits as told by your health care provider. This is important. Contact a health care provider if you have:  A headache that does not go away with medicine.  Hives, rash, or itchy skin.  Nausea or vomiting.  Unusual tiredness or weakness.  Signs of infection at your IV site. Get help right away if:  You have a fever or chills.  You urinate less often than usual.  Your urine is darker colored than normal.  You have any of the following: ? Trouble breathing. ? Pain in your back, abdomen, or chest. ? Cool, clammy skin. ? A fast heartbeat. Summary  Platelets are tiny pieces of blood cells that clump together to form a blood clot when you have an injury. If you have too few platelets, your blood may have trouble clotting.  A platelet transfusion is a procedure in which you receive donated platelets through an IV.  A platelet transfusion may be used to stop or prevent excessive bleeding.  After the procedure, check your IV site every day for signs of infection, including redness, swelling, pain, or warmth. This information is not intended to replace advice given to you by your health care provider. Make sure you discuss any questions you have with your health care provider. Document Revised: 02/13/2017 Document Reviewed: 02/13/2017 Elsevier  Patient Education  2020 [employer]. https://www.redcrossblood.org/donate-blood/blood-donation-process/what-happens-to-donated-blood/blood-transfusions/types-of-blood-transfusions.html"> https://www.hematology.org/education/patients/blood-basics/blood-safety-and-matching"> https://www.nhlbi.nih.gov/health-topics/blood-transfusion">  Blood Transfusion, Adult A blood transfusion is a procedure in which you receive blood or a type of blood cell (blood component) through an IV. You may need a blood transfusion when your blood level is low. This may result from a bleeding disorder, illness, injury, or surgery. The blood may come from a donor. You may also be able to donate blood for yourself (autologous blood donation) before a planned surgery. The blood given in a transfusion is made up of different blood components. You may receive:  Red blood cells. These carry oxygen to the cells in the body.  Platelets. These help your blood to clot.  Plasma. This is the liquid part of your blood. It carries proteins and other substances throughout the body.  White blood cells. These help you fight infections. If you have hemophilia or another clotting disorder, you may also receive other types of blood products. Tell a health care provider about:  Any blood disorders you have.  Any previous reactions you have had during a blood transfusion.  Any allergies you have.  All medicines you are taking, including vitamins, herbs, eye drops, creams, and over-the-counter medicines.  Any surgeries you have had.  Any medical conditions  you have, including any recent fever or cold symptoms.  Whether you are pregnant or may be pregnant. What are the risks? Generally, this is a safe procedure. However, problems may occur.  The most common problems include: ? A mild allergic reaction, such as red, swollen areas of skin (hives) and itching. ? Fever or chills. This may be the body's response to new blood cells  received. This may occur during or up to 4 hours after the transfusion.  More serious problems may include: ? Transfusion-associated circulatory overload (TACO), or too much fluid in the lungs. This may cause breathing problems. ? A serious allergic reaction, such as difficulty breathing or swelling around the face and lips. ? Transfusion-related acute lung injury (TRALI), which causes breathing difficulty and low oxygen in the blood. This can occur within hours of the transfusion or several days later. ? Iron overload. This can happen after receiving many blood transfusions over a period of time. ? Infection or virus being transmitted. This is rare because donated blood is carefully tested before it is given. ? Hemolytic transfusion reaction. This is rare. It happens when your body's defense system (immune system)tries to attack the new blood cells. Symptoms may include fever, chills, nausea, low blood pressure, and low back or chest pain. ? Transfusion-associated graft-versus-host disease (TAGVHD). This is rare. It happens when donated cells attack your body's healthy tissues. What happens before the procedure? Medicines Ask your health care provider about:  Changing or stopping your regular medicines. This is especially important if you are taking diabetes medicines or blood thinners.  Taking medicines such as aspirin and ibuprofen. These medicines can thin your blood. Do not take these medicines unless your health care provider tells you to take them.  Taking over-the-counter medicines, vitamins, herbs, and supplements. General instructions  Follow instructions from your health care provider about eating and drinking restrictions.  You will have a blood test to determine your blood type. This is necessary to know what kind of blood your body will accept and to match it to the donor blood.  If you are going to have a planned surgery, you may be able to do an autologous blood donation.  This may be done in case you need to have a transfusion.  You will have your temperature, blood pressure, and pulse monitored before the transfusion.  If you have had an allergic reaction to a transfusion in the past, you may be given medicine to help prevent a reaction. This medicine may be given to you by mouth (orally) or through an IV.  Set aside time for the blood transfusion. This procedure generally takes 1-4 hours to complete. What happens during the procedure?   An IV will be inserted into one of your veins.  The bag of donated blood will be attached to your IV. The blood will then enter through your vein.  Your temperature, blood pressure, and pulse will be monitored regularly during the transfusion. This monitoring is done to detect early signs of a transfusion reaction.  Tell your nurse right away if you have any of these symptoms during the transfusion: ? Shortness of breath or trouble breathing. ? Chest or back pain. ? Fever or chills. ? Hives or itching.  If you have any signs or symptoms of a reaction, your transfusion will be stopped and you may be given medicine.  When the transfusion is complete, your IV will be removed.  Pressure may be applied to the IV site for a few  minutes.  A bandage (dressing)will be applied. The procedure may vary among health care providers and hospitals. What happens after the procedure?  Your temperature, blood pressure, pulse, breathing rate, and blood oxygen level will be monitored until you leave the hospital or clinic.  Your blood may be tested to see how you are responding to the transfusion.  You may be warmed with fluids or blankets to maintain a normal body temperature.  If you receive your blood transfusion in an outpatient setting, you will be told whom to contact to report any reactions. Where to find more information For more information on blood transfusions, visit the American Red Cross: redcross.org Summary  A  blood transfusion is a procedure in which you receive blood or a type of blood cell (blood component) through an IV.  The blood you receive may come from a donor or be donated by yourself (autologous blood donation) before a planned surgery.  The blood given in a transfusion is made up of different blood components. You may receive red blood cells, platelets, plasma, or white blood cells depending on the condition treated.  Your temperature, blood pressure, and pulse will be monitored before, during, and after the transfusion.  After the transfusion, your blood may be tested to see how your body has responded. This information is not intended to replace advice given to you by your health care provider. Make sure you discuss any questions you have with your health care provider. Document Revised: 07/03/2018 Document Reviewed: 07/03/2018 Elsevier Patient Education  [place].

## 2019-10-20 NOTE — Patient Instructions (Signed)

## 2019-10-21 ENCOUNTER — Emergency Department (HOSPITAL_COMMUNITY): Payer: PPO

## 2019-10-21 ENCOUNTER — Other Ambulatory Visit: Payer: Self-pay

## 2019-10-21 ENCOUNTER — Telehealth: Payer: PPO

## 2019-10-21 ENCOUNTER — Inpatient Hospital Stay (HOSPITAL_COMMUNITY)
Admission: EM | Admit: 2019-10-21 | Discharge: 2019-10-28 | DRG: 871 | Disposition: A | Discharge status: Home Health | Payer: PPO | Attending: Internal Medicine | Admitting: Internal Medicine

## 2019-10-21 ENCOUNTER — Telehealth: Payer: Self-pay | Admitting: *Deleted

## 2019-10-21 ENCOUNTER — Encounter (HOSPITAL_COMMUNITY): Payer: Self-pay | Admitting: Emergency Medicine

## 2019-10-21 DIAGNOSIS — U071 COVID-19: Secondary | ICD-10-CM | POA: Diagnosis not present

## 2019-10-21 DIAGNOSIS — R531 Weakness: Secondary | ICD-10-CM | POA: Diagnosis not present

## 2019-10-21 DIAGNOSIS — I110 Hypertensive heart disease with heart failure: Secondary | ICD-10-CM | POA: Diagnosis present

## 2019-10-21 DIAGNOSIS — Z96651 Presence of right artificial knee joint: Secondary | ICD-10-CM | POA: Diagnosis present

## 2019-10-21 DIAGNOSIS — R6520 Severe sepsis without septic shock: Secondary | ICD-10-CM

## 2019-10-21 DIAGNOSIS — R630 Anorexia: Secondary | ICD-10-CM | POA: Diagnosis present

## 2019-10-21 DIAGNOSIS — I2510 Atherosclerotic heart disease of native coronary artery without angina pectoris: Secondary | ICD-10-CM | POA: Diagnosis present

## 2019-10-21 DIAGNOSIS — Z8051 Family history of malignant neoplasm of kidney: Secondary | ICD-10-CM

## 2019-10-21 DIAGNOSIS — R7989 Other specified abnormal findings of blood chemistry: Secondary | ICD-10-CM | POA: Diagnosis present

## 2019-10-21 DIAGNOSIS — Z79899 Other long term (current) drug therapy: Secondary | ICD-10-CM

## 2019-10-21 DIAGNOSIS — Z981 Arthrodesis status: Secondary | ICD-10-CM

## 2019-10-21 DIAGNOSIS — B9562 Methicillin resistant Staphylococcus aureus infection as the cause of diseases classified elsewhere: Secondary | ICD-10-CM | POA: Diagnosis not present

## 2019-10-21 DIAGNOSIS — F32A Depression, unspecified: Secondary | ICD-10-CM | POA: Diagnosis present

## 2019-10-21 DIAGNOSIS — D61818 Other pancytopenia: Secondary | ICD-10-CM | POA: Diagnosis present

## 2019-10-21 DIAGNOSIS — K219 Gastro-esophageal reflux disease without esophagitis: Secondary | ICD-10-CM | POA: Diagnosis present

## 2019-10-21 DIAGNOSIS — Z8249 Family history of ischemic heart disease and other diseases of the circulatory system: Secondary | ICD-10-CM

## 2019-10-21 DIAGNOSIS — R7881 Bacteremia: Secondary | ICD-10-CM | POA: Diagnosis not present

## 2019-10-21 DIAGNOSIS — R509 Fever, unspecified: Secondary | ICD-10-CM | POA: Diagnosis not present

## 2019-10-21 DIAGNOSIS — Z823 Family history of stroke: Secondary | ICD-10-CM

## 2019-10-21 DIAGNOSIS — Z6835 Body mass index (BMI) 35.0-35.9, adult: Secondary | ICD-10-CM

## 2019-10-21 DIAGNOSIS — Z87891 Personal history of nicotine dependence: Secondary | ICD-10-CM

## 2019-10-21 DIAGNOSIS — C9002 Multiple myeloma in relapse: Secondary | ICD-10-CM | POA: Diagnosis not present

## 2019-10-21 DIAGNOSIS — N400 Enlarged prostate without lower urinary tract symptoms: Secondary | ICD-10-CM | POA: Diagnosis present

## 2019-10-21 DIAGNOSIS — B957 Other staphylococcus as the cause of diseases classified elsewhere: Secondary | ICD-10-CM | POA: Diagnosis present

## 2019-10-21 DIAGNOSIS — A411 Sepsis due to other specified staphylococcus: Secondary | ICD-10-CM | POA: Diagnosis present

## 2019-10-21 DIAGNOSIS — D849 Immunodeficiency, unspecified: Secondary | ICD-10-CM | POA: Diagnosis present

## 2019-10-21 DIAGNOSIS — I10 Essential (primary) hypertension: Secondary | ICD-10-CM | POA: Diagnosis present

## 2019-10-21 DIAGNOSIS — E669 Obesity, unspecified: Secondary | ICD-10-CM | POA: Diagnosis present

## 2019-10-21 DIAGNOSIS — Z7982 Long term (current) use of aspirin: Secondary | ICD-10-CM

## 2019-10-21 DIAGNOSIS — Z9481 Bone marrow transplant status: Secondary | ICD-10-CM | POA: Diagnosis not present

## 2019-10-21 DIAGNOSIS — Z96612 Presence of left artificial shoulder joint: Secondary | ICD-10-CM | POA: Diagnosis present

## 2019-10-21 DIAGNOSIS — Z96611 Presence of right artificial shoulder joint: Secondary | ICD-10-CM | POA: Diagnosis present

## 2019-10-21 DIAGNOSIS — I340 Nonrheumatic mitral (valve) insufficiency: Secondary | ICD-10-CM | POA: Diagnosis not present

## 2019-10-21 DIAGNOSIS — Z66 Do not resuscitate: Secondary | ICD-10-CM | POA: Diagnosis present

## 2019-10-21 DIAGNOSIS — L89896 Pressure-induced deep tissue damage of other site: Secondary | ICD-10-CM | POA: Diagnosis present

## 2019-10-21 DIAGNOSIS — N390 Urinary tract infection, site not specified: Secondary | ICD-10-CM | POA: Diagnosis present

## 2019-10-21 DIAGNOSIS — A419 Sepsis, unspecified organism: Secondary | ICD-10-CM | POA: Diagnosis present

## 2019-10-21 DIAGNOSIS — E785 Hyperlipidemia, unspecified: Secondary | ICD-10-CM | POA: Diagnosis present

## 2019-10-21 DIAGNOSIS — D709 Neutropenia, unspecified: Secondary | ICD-10-CM | POA: Diagnosis present

## 2019-10-21 DIAGNOSIS — I5032 Chronic diastolic (congestive) heart failure: Secondary | ICD-10-CM | POA: Diagnosis present

## 2019-10-21 DIAGNOSIS — A403 Sepsis due to Streptococcus pneumoniae: Secondary | ICD-10-CM | POA: Diagnosis not present

## 2019-10-21 DIAGNOSIS — J1282 Pneumonia due to coronavirus disease 2019: Secondary | ICD-10-CM | POA: Diagnosis present

## 2019-10-21 DIAGNOSIS — N178 Other acute kidney failure: Secondary | ICD-10-CM | POA: Diagnosis not present

## 2019-10-21 DIAGNOSIS — C9000 Multiple myeloma not having achieved remission: Secondary | ICD-10-CM | POA: Diagnosis present

## 2019-10-21 DIAGNOSIS — Z806 Family history of leukemia: Secondary | ICD-10-CM

## 2019-10-21 DIAGNOSIS — F419 Anxiety disorder, unspecified: Secondary | ICD-10-CM | POA: Diagnosis present

## 2019-10-21 DIAGNOSIS — Z9484 Stem cells transplant status: Secondary | ICD-10-CM | POA: Diagnosis not present

## 2019-10-21 DIAGNOSIS — R6521 Severe sepsis with septic shock: Secondary | ICD-10-CM | POA: Diagnosis not present

## 2019-10-21 DIAGNOSIS — Z515 Encounter for palliative care: Secondary | ICD-10-CM | POA: Diagnosis not present

## 2019-10-21 DIAGNOSIS — I248 Other forms of acute ischemic heart disease: Secondary | ICD-10-CM | POA: Diagnosis present

## 2019-10-21 DIAGNOSIS — E876 Hypokalemia: Secondary | ICD-10-CM | POA: Diagnosis present

## 2019-10-21 DIAGNOSIS — Z7189 Other specified counseling: Secondary | ICD-10-CM | POA: Diagnosis not present

## 2019-10-21 DIAGNOSIS — B9689 Other specified bacterial agents as the cause of diseases classified elsewhere: Secondary | ICD-10-CM | POA: Diagnosis not present

## 2019-10-21 LAB — COMPREHENSIVE METABOLIC PANEL
ALT: 18 U/L (ref 0–44)
ALT: 17 U/L (ref 0–44)
AST: 42 U/L — ABNORMAL HIGH (ref 15–41)
AST: 45 U/L — ABNORMAL HIGH (ref 15–41)
Albumin: 3.4 g/dL — ABNORMAL LOW (ref 3.5–5.0)
Albumin: 3 g/dL — ABNORMAL LOW (ref 3.5–5.0)
Alkaline Phosphatase: 50 U/L (ref 38–126)
Alkaline Phosphatase: 44 U/L (ref 38–126)
Anion gap: 14 (ref 5–15)
Anion gap: 11 (ref 5–15)
BUN: 26 mg/dL — ABNORMAL HIGH (ref 8–23)
BUN: 25 mg/dL — ABNORMAL HIGH (ref 8–23)
CO2: 22 mmol/L (ref 22–32)
CO2: 22 mmol/L (ref 22–32)
Calcium: 9.5 mg/dL (ref 8.9–10.3)
Calcium: 8.9 mg/dL (ref 8.9–10.3)
Chloride: 104 mmol/L (ref 98–111)
Chloride: 106 mmol/L (ref 98–111)
Creatinine, Ser: 1.24 mg/dL (ref 0.61–1.24)
Creatinine, Ser: 1.17 mg/dL (ref 0.61–1.24)
GFR calc Af Amer: >60 mL/min (ref >60)
GFR calc Af Amer: >60 mL/min (ref >60)
GFR calc non Af Amer: 54 mL/min — ABNORMAL LOW (ref >60)
GFR calc non Af Amer: 58 mL/min — ABNORMAL LOW (ref >60)
Glucose, Bld: 123 mg/dL — ABNORMAL HIGH (ref 70–99)
Glucose, Bld: 109 mg/dL — ABNORMAL HIGH (ref 70–99)
Potassium: 3 mmol/L — ABNORMAL LOW (ref 3.5–5.1)
Potassium: 3.2 mmol/L — ABNORMAL LOW (ref 3.5–5.1)
Sodium: 140 mmol/L (ref 135–145)
Sodium: 139 mmol/L (ref 135–145)
Total Bilirubin: 0.8 mg/dL (ref 0.3–1.2)
Total Bilirubin: 1.1 mg/dL (ref 0.3–1.2)
Total Protein: 7.9 g/dL (ref 6.5–8.1)
Total Protein: 7.1 g/dL (ref 6.5–8.1)

## 2019-10-21 LAB — CBC WITH DIFFERENTIAL/PLATELET
Abs Immature Granulocytes: 0.1 10*3/uL — ABNORMAL HIGH (ref 0.00–0.07)
Basophils Absolute: 0 10*3/uL (ref 0.0–0.1)
Basophils Relative: 0 %
Eosinophils Absolute: 0 10*3/uL (ref 0.0–0.5)
Eosinophils Relative: 0 %
HCT: 27.2 % — ABNORMAL LOW (ref 39.0–52.0)
Hemoglobin: 9.4 g/dL — ABNORMAL LOW (ref 13.0–17.0)
Lymphocytes Relative: 6 %
Lymphs Abs: 0.1 10*3/uL — ABNORMAL LOW (ref 0.7–4.0)
MCH: 32.3 pg (ref 26.0–34.0)
MCHC: 34.6 g/dL (ref 30.0–36.0)
MCV: 93.5 fL (ref 80.0–100.0)
Metamyelocytes Relative: 2 %
Monocytes Absolute: 0.2 10*3/uL (ref 0.1–1.0)
Monocytes Relative: 10 %
Myelocytes: 2 %
Neutro Abs: 1.9 10*3/uL (ref 1.7–7.7)
Neutrophils Relative %: 80 %
Platelets: 14 10*3/uL — CL (ref 150–400)
RBC: 2.91 MIL/uL — ABNORMAL LOW (ref 4.22–5.81)
RDW: 19.9 % — ABNORMAL HIGH (ref 11.5–15.5)
WBC: 2.4 10*3/uL — ABNORMAL LOW (ref 4.0–10.5)
nRBC: 0 % (ref 0.0–0.2)

## 2019-10-21 LAB — URINALYSIS, ROUTINE W REFLEX MICROSCOPIC
Bilirubin Urine: NEGATIVE
Glucose, UA: NEGATIVE mg/dL
Ketones, ur: NEGATIVE mg/dL
Leukocytes,Ua: NEGATIVE
Nitrite: NEGATIVE
Protein, ur: 100 mg/dL — AB
Specific Gravity, Urine: 1.015 (ref 1.005–1.030)
pH: 5 (ref 5.0–8.0)

## 2019-10-21 LAB — HEPATITIS B SURFACE ANTIGEN: Hepatitis B Surface Ag: NONREACTIVE

## 2019-10-21 LAB — LACTIC ACID, PLASMA
Lactic Acid, Venous: 2 mmol/L (ref 0.5–1.9)
Lactic Acid, Venous: 2 mmol/L (ref 0.5–1.9)

## 2019-10-21 LAB — APTT: aPTT: 39 seconds — ABNORMAL HIGH (ref 24–36)

## 2019-10-21 LAB — PREPARE PLATELET PHERESIS: Unit division: 0

## 2019-10-21 LAB — BPAM PLATELET PHERESIS
Blood Product Expiration Date: 202109292359
ISSUE DATE / TIME: 202109281032
Unit Type and Rh: 6200

## 2019-10-21 LAB — RESPIRATORY PANEL BY RT PCR (FLU A&B, COVID)
Influenza A by PCR: NEGATIVE
Influenza B by PCR: NEGATIVE
SARS Coronavirus 2 by RT PCR: POSITIVE — AB

## 2019-10-21 LAB — C-REACTIVE PROTEIN: CRP: 17.6 mg/dL — ABNORMAL HIGH (ref <1.0)

## 2019-10-21 LAB — TROPONIN I (HIGH SENSITIVITY)
Troponin I (High Sensitivity): 253 ng/L (ref <18)
Troponin I (High Sensitivity): 275 ng/L (ref <18)
Troponin I (High Sensitivity): 307 ng/L (ref <18)

## 2019-10-21 LAB — FERRITIN: Ferritin: 1489 ng/mL — ABNORMAL HIGH (ref 24–336)

## 2019-10-21 LAB — D-DIMER, QUANTITATIVE: D-Dimer, Quant: 0.77 ug/mL-FEU — ABNORMAL HIGH (ref 0.00–0.50)

## 2019-10-21 LAB — BRAIN NATRIURETIC PEPTIDE: B Natriuretic Peptide: 248 pg/mL — ABNORMAL HIGH (ref 0.0–100.0)

## 2019-10-21 LAB — FIBRINOGEN: Fibrinogen: 462 mg/dL (ref 210–475)

## 2019-10-21 LAB — MAGNESIUM: Magnesium: 1.9 mg/dL (ref 1.7–2.4)

## 2019-10-21 LAB — PHOSPHORUS: Phosphorus: 3.2 mg/dL (ref 2.5–4.6)

## 2019-10-21 LAB — PROCALCITONIN: Procalcitonin: 4.33 ng/mL

## 2019-10-21 LAB — LACTATE DEHYDROGENASE: LDH: 263 U/L — ABNORMAL HIGH (ref 98–192)

## 2019-10-21 LAB — POC SARS CORONAVIRUS 2 AG -  ED: SARS Coronavirus 2 Ag: NEGATIVE

## 2019-10-21 LAB — PROTIME-INR
INR: 1.3 — ABNORMAL HIGH (ref 0.8–1.2)
Prothrombin Time: 15.2 seconds (ref 11.4–15.2)

## 2019-10-21 LAB — TSH: TSH: 0.94 u[IU]/mL (ref 0.350–4.500)

## 2019-10-21 MED ORDER — SODIUM CHLORIDE 0.9 % IV SOLN
2.0000 g | Freq: Two times a day (BID) | INTRAVENOUS | Status: DC
  Administered 2019-10-21 – 2019-10-25 (×8): 2 g via INTRAVENOUS
  Filled 2019-10-21 (×9): qty 2

## 2019-10-21 MED ORDER — ZINC SULFATE 220 (50 ZN) MG PO CAPS
220.0000 mg | ORAL_CAPSULE | Freq: Every day | ORAL | Status: DC
  Administered 2019-10-21 – 2019-10-28 (×8): 220 mg via ORAL
  Filled 2019-10-21 (×8): qty 1

## 2019-10-21 MED ORDER — ACETAMINOPHEN 650 MG RE SUPP: 650.0000 mg | Freq: Four times a day (QID) | RECTAL | Status: DC | PRN

## 2019-10-21 MED ORDER — VANCOMYCIN HCL IN DEXTROSE 1-5 GM/200ML-% IV SOLN: 1000.0000 mg | Freq: Once | INTRAVENOUS | Status: DC

## 2019-10-21 MED ORDER — ONDANSETRON HCL 4 MG PO TABS: 4.0000 mg | ORAL_TABLET | Freq: Four times a day (QID) | ORAL | Status: DC | PRN

## 2019-10-21 MED ORDER — SODIUM CHLORIDE 0.9 % IV SOLN: 2.0000 g | Freq: Once | INTRAVENOUS | Status: DC

## 2019-10-21 MED ORDER — SODIUM CHLORIDE 0.9 % IV SOLN: 200.0000 mg | Freq: Once | INTRAVENOUS | Status: DC

## 2019-10-21 MED ORDER — LACTATED RINGERS IV BOLUS
1000.0000 mL | Freq: Once | INTRAVENOUS | Status: AC
  Administered 2019-10-21: 1000 mL via INTRAVENOUS

## 2019-10-21 MED ORDER — SODIUM CHLORIDE 0.9 % IV SOLN
2.0000 g | Freq: Once | INTRAVENOUS | Status: AC
  Administered 2019-10-21: 2 g via INTRAVENOUS
  Filled 2019-10-21: qty 2

## 2019-10-21 MED ORDER — LORAZEPAM 0.5 MG PO TABS: 0.5000 mg | ORAL_TABLET | Freq: Four times a day (QID) | ORAL | Status: DC | PRN

## 2019-10-21 MED ORDER — VANCOMYCIN HCL 1500 MG/300ML IV SOLN
1500.0000 mg | Freq: Once | INTRAVENOUS | Status: DC
  Administered 2019-10-21: 1500 mg via INTRAVENOUS
  Filled 2019-10-21: qty 300

## 2019-10-21 MED ORDER — VANCOMYCIN HCL 750 MG/150ML IV SOLN: 750.0000 mg | Freq: Two times a day (BID) | INTRAVENOUS | Status: DC

## 2019-10-21 MED ORDER — ONDANSETRON HCL 4 MG/2ML IJ SOLN
4.0000 mg | Freq: Four times a day (QID) | INTRAMUSCULAR | Status: DC | PRN
  Administered 2019-10-21: 4 mg via INTRAVENOUS
  Filled 2019-10-21: qty 2

## 2019-10-21 MED ORDER — SODIUM CHLORIDE 0.9 % IV SOLN
100.0000 mg | Freq: Every day | INTRAVENOUS | Status: AC
  Administered 2019-10-22 – 2019-10-25 (×4): 100 mg via INTRAVENOUS
  Filled 2019-10-21 (×4): qty 20

## 2019-10-21 MED ORDER — MIRTAZAPINE 15 MG PO TABS
7.5000 mg | ORAL_TABLET | Freq: Every day | ORAL | Status: DC
  Administered 2019-10-21 – 2019-10-27 (×7): 7.5 mg via ORAL
  Filled 2019-10-21 (×7): qty 1

## 2019-10-21 MED ORDER — SODIUM CHLORIDE 0.9 % IV SOLN: INTRAVENOUS | Status: DC

## 2019-10-21 MED ORDER — ACETAMINOPHEN 325 MG PO TABS
650.0000 mg | ORAL_TABLET | Freq: Four times a day (QID) | ORAL | Status: DC | PRN
  Administered 2019-10-21 – 2019-10-23 (×4): 650 mg via ORAL
  Filled 2019-10-21 (×4): qty 2

## 2019-10-21 MED ORDER — METRONIDAZOLE IN NACL 5-0.79 MG/ML-% IV SOLN
500.0000 mg | Freq: Once | INTRAVENOUS | Status: AC
  Administered 2019-10-21: 500 mg via INTRAVENOUS
  Filled 2019-10-21: qty 100

## 2019-10-21 MED ORDER — DRONABINOL 2.5 MG PO CAPS
2.5000 mg | ORAL_CAPSULE | Freq: Two times a day (BID) | ORAL | Status: DC
  Administered 2019-10-25 – 2019-10-28 (×6): 2.5 mg via ORAL
  Filled 2019-10-21 (×7): qty 1

## 2019-10-21 MED ORDER — SODIUM CHLORIDE 0.9 % IV SOLN
200.0000 mg | Freq: Once | INTRAVENOUS | Status: AC
  Administered 2019-10-21: 200 mg via INTRAVENOUS
  Filled 2019-10-21: qty 200

## 2019-10-21 MED ORDER — ASCORBIC ACID 500 MG PO TABS
500.0000 mg | ORAL_TABLET | Freq: Every day | ORAL | Status: DC
  Administered 2019-10-21 – 2019-10-28 (×8): 500 mg via ORAL
  Filled 2019-10-21 (×8): qty 1

## 2019-10-21 MED ORDER — LACTATED RINGERS IV SOLN: INTRAVENOUS | Status: AC

## 2019-10-21 MED ORDER — VALACYCLOVIR HCL 500 MG PO TABS
1000.0000 mg | ORAL_TABLET | Freq: Every day | ORAL | Status: DC
  Administered 2019-10-21 – 2019-10-28 (×8): 1000 mg via ORAL
  Filled 2019-10-21 (×8): qty 2

## 2019-10-21 MED ORDER — METHOCARBAMOL 500 MG PO TABS
500.0000 mg | ORAL_TABLET | Freq: Three times a day (TID) | ORAL | Status: DC | PRN
  Administered 2019-10-23 – 2019-10-26 (×6): 500 mg via ORAL
  Filled 2019-10-21 (×6): qty 1

## 2019-10-21 MED ORDER — SODIUM CHLORIDE 0.9 % IV SOLN: 100.0000 mg | Freq: Every day | INTRAVENOUS | Status: DC

## 2019-10-21 MED ORDER — PANTOPRAZOLE SODIUM 40 MG PO TBEC: 40.0000 mg | DELAYED_RELEASE_TABLET | Freq: Every day | ORAL | Status: DC

## 2019-10-21 MED ORDER — LACTATED RINGERS IV BOLUS (SEPSIS)
1000.0000 mL | Freq: Once | INTRAVENOUS | Status: AC
  Administered 2019-10-21: 1000 mL via INTRAVENOUS

## 2019-10-21 MED ORDER — POTASSIUM CHLORIDE CRYS ER 20 MEQ PO TBCR
20.0000 meq | EXTENDED_RELEASE_TABLET | Freq: Once | ORAL | Status: AC
  Administered 2019-10-21: 20 meq via ORAL
  Filled 2019-10-21: qty 1

## 2019-10-21 MED ORDER — TAMSULOSIN HCL 0.4 MG PO CAPS
0.4000 mg | ORAL_CAPSULE | Freq: Every day | ORAL | Status: DC
  Administered 2019-10-21 – 2019-10-27 (×7): 0.4 mg via ORAL
  Filled 2019-10-21 (×7): qty 1

## 2019-10-21 MED ORDER — TRAMADOL HCL 50 MG PO TABS
50.0000 mg | ORAL_TABLET | Freq: Four times a day (QID) | ORAL | Status: DC | PRN
  Administered 2019-10-22 – 2019-10-25 (×6): 50 mg via ORAL
  Filled 2019-10-21 (×6): qty 1

## 2019-10-21 NOTE — Telephone Encounter (Signed)
Call received from patient's wife stating that she does not feel as though she can physically get patient here today for blood transfusion, d/t pt had "rough night."  Gene Hill states that patient got up to go to the bathroom last night, was unable to get back to bed and that she had to call an ambulance to assist getting pt back to bed.  She states that pt did have "sweating episodes" times two during the night.  Dr. Marin Olp notified and would like for pt to go to the [facility] ED now.  Gene Hill states that she will call an ambulance to take him to the [facility] ED now and is appreciative of assistance.

## 2019-10-21 NOTE — ED Notes (Signed)
Date and time results received: 10/21/19 11:20 AM  (use smartphrase ".now" to insert current time)  Test: Platelets  Critical Value: 14  Name of Provider Notified: Denton Ar, RN   Orders Received? Or Actions Taken?: Actions Taken: Dr. Jeanell Sparrow & Denton Ar, RN

## 2019-10-21 NOTE — H&P (Addendum)
History and Physical    Gene Hill XKG:[id number] DOB: [birth year] DOA: 10/21/2019  PCP: Colon Branch, MD  Patient coming from: Home  I have personally briefly reviewed patient's old medical records in [place]  Chief Complaint: "Iam not feeling well"  HPI: Gene Hill is a 79 y.o. male with medical history significant of multiple myeloma, hypertension, hyperlipidemia, GERD, coronary artery disease status post cath in 1998, lower back pain, arthritis, pancytopenia presents to emergency department with generalized weakness and not feeling well overall.  Patient tells me that he has not been feeling well, has fever and generalized weakness started last night. He received blood transfusion yesterday. Reports that last night he was feeling extremely weak and had a fever. He received second dose of COVID-19 vaccine on Friday and thinks that his symptoms could be related to his second shot. Reports decreased appetite as well.  He denies headache, blurry vision, lightheadedness, dizziness, cough, congestion, nausea, vomiting, abdominal pain, urinary or bowel changes. Denies melena, hematemesis, hemoptysis, epistaxis, petechiae or bruises on his body.  He has a port on right upper chest however he denies redness or swelling around the port site.  No history of smoking, alcohol, illicit drug use. His wife called EMS and reported his temperature was 100.4 in route to ED.  ED Course: Upon arrival to ED: Patient had fever of 101.4, tachycardic, tachypneic, lactic acid of 2.0, WBC of 2.4, platelet: 14, potassium: 3.0, UA positive for rare bacteria, POC COVID-19 negative. Chest x-ray shows minimal bibasilar atelectasis. Patient received IV fluid, cefepime, vancomycin, Flagyl and potassium in ED. [facility] hospitalist consulted for admission for sepsis secondary to unknown cause.  Review of Systems: As per HPI otherwise negative.    Past Medical History:  Diagnosis Date  . Arthritis   .  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 Blood dyscrasia    low WBC d/t multiple myeloma  . Bulging lumbar disc    L2-3  . Carotid bruit    3-11 carotid u/s was  (-)  . 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 GERD (gastroesophageal reflux disease)   . Headache   . Heart murmur    doesn't have one  . History of hiatal hernia   . HYPERLIPIDEMIA 05/03/2006  . HYPERTENSION 05/03/2006  . 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 Torn rotator cuff 01/22/2017   left- [place]    Past Surgical History:  Procedure Laterality Date  . BACK SURGERY    . CERVICAL FUSION    . IR FLUORO GUIDE PORT INSERTION RIGHT  11/29/2016  . IR US GUIDE VASC ACCESS RIGHT  11/29/2016  . [place]   right  . REVERSE SHOULDER ARTHROPLASTY Right 06/25/2018   Procedure: REVERSE SHOULDER ARTHROPLASTY;  Surgeon: Hiram Gash, MD;  Location: [facility];  Service: Orthopedics;  Laterality: Right;  . REVERSE SHOULDER ARTHROPLASTY Left 11/19/2018   Procedure: REVERSE SHOULDER ARTHROPLASTY;  Surgeon: Hiram Gash, MD;  Location: [facility];  Service: Orthopedics;  Laterality: Left;  . ROTATOR CUFF REPAIR     right  . SPINAL FUSION     lumbar  . TOTAL KNEE ARTHROPLASTY Right 12/23/2015   Procedure: TOTAL KNEE ARTHROPLASTY;  Surgeon: Earlie Server, MD;  Location: [facility];  Service: Orthopedics;  Laterality: Right;  . TOTAL KNEE REVISION Right 02/18/2019  . TOTAL KNEE REVISION Right 02/18/2019   Procedure: TOTAL KNEE REVISION;  Surgeon: Earlie Server, MD;  Location: [facility];  Service: Orthopedics;  Laterality: Right;     reports that he quit smoking about 44 years ago. His smoking use included cigarettes. He started smoking about 65 years ago. He has a 5.00 pack-year smoking history. He has never used smokeless  tobacco. He reports current alcohol use. He reports that he does not use drugs.  No Known Allergies  Family History  Problem Relation Age of Onset  . Kidney cancer Father   . Leukemia Mother   . Pulmonary embolism Brother   . Stroke Maternal Grandmother   . Heart attack Maternal Grandfather   . Stroke Paternal Grandmother   . Cerebral palsy Son   . Prostate cancer Neg Hx   . Colon cancer Neg Hx   . Esophageal cancer Neg Hx   . Rectal cancer Neg Hx   . Stomach cancer Neg Hx     Prior to Admission medications   Medication Sig Start Date End Date Taking? Authorizing Provider  acetaminophen (TYLENOL) 500 MG tablet Take 1,000 mg by mouth 3 (three) times daily as needed (pain).    Yes [provider]  aspirin 325 MG tablet Take 325 mg by mouth daily.   Yes [provider]  b complex vitamins tablet Take 1 tablet by mouth daily.   Yes [provider]  calcium carbonate (OSCAL) 1500 (600 Ca) MG TABS tablet Take 600 mg of elemental calcium by mouth daily with breakfast.    Yes [provider]  Cyanocobalamin (VITAMIN B-12 PO) Take 5,000 mcg by mouth daily.    Yes [provider]  dronabinol (MARINOL) 2.5 MG capsule TAKE 1 CAPSULE(2.5 MG) BY MOUTH TWICE DAILY BEFORE A MEAL Patient taking differently: Take 2.5 mg by mouth 2 (two) times daily before lunch and supper.  09/14/19  Yes [name], MD  famciclovir (FAMVIR) 250 MG tablet Take 1 tablet (250 mg total) by mouth daily. 09/14/19  Yes Ennever, Rudell Cobb, MD  LORazepam (ATIVAN) 0.5 MG tablet Take 0.5 mg by mouth every 6 (six) hours as needed (nausea/vomiting).   Yes [provider]  methocarbamol (ROBAXIN) 500 MG tablet Take 1 tablet (500 mg total) by mouth 3 (three) times daily as needed for muscle spasms. 09/14/19  Yes Paz, Alda Berthold, MD  metoprolol tartrate (LOPRESSOR) 50 MG tablet Take 1 tablet (50 mg total) by mouth 2 (two) times daily. 03/23/19  Yes Paz, Alda Berthold, MD  mirtazapine  (REMERON) 7.5 MG tablet Take 1 tablet (7.5 mg total) by mouth at bedtime. 09/14/19  Yes [name], MD  Multiple Vitamin (MULTIVITAMIN) tablet Take 1 tablet by mouth daily.   Yes [provider]  Omega-3 Fatty Acids (FISH OIL) 1200 MG CAPS Take 1,200 mg by mouth daily.   Yes [provider]  ondansetron (ZOFRAN) 8 MG tablet Take 8 mg by mouth every 8 (eight) hours as needed for nausea or vomiting.   Yes [provider]  OVER THE COUNTER MEDICATION Take 2.5 mg by mouth daily. Marijuana (Appetite stimulant)   Yes [provider]  pantoprazole (PROTONIX) 40 MG tablet Take 1 tablet (40 mg total) by mouth at bedtime. 06/17/19  Yes Colon Branch, MD  Probiotic Product (PROBIOTIC PO) Take 1 tablet by mouth daily.    Yes [provider]  tamsulosin (FLOMAX) 0.4 MG CAPS capsule Take 0.4 mg by mouth at bedtime.    Yes [provider]  traMADol (ULTRAM) 50 MG tablet Take 1 tablet (50 mg total) by mouth every 6 (six) hours as needed. 90 day supply please 09/14/19  Yes Ennever, Rudell Cobb, MD  prochlorperazine (COMPAZINE) 10 MG tablet Take 1 tablet (10 mg total) by mouth every 6 (six) hours as needed (Nausea or vomiting). Patient not taking: Reported on 09/29/2019 08/24/19 10/07/19  [name], MD    Physical Exam: Vitals:   10/21/19 1200 10/21/19 1215 10/21/19 1300 10/21/19 1315  BP: 133/76 137/76 (!) 146/79 134/76  Pulse: (!) 105 (!) 105 (!) 102 (!) 103  Resp: (!) 28 (!) 25 (!) 28 (!) 27  Temp:      TempSrc:      SpO2: 95% 96% 99% 98%    Constitutional: NAD, calm, comfortable, on room air, appears weak and lethargic, communicating well Eyes: PERRL, lids and conjunctivae normal ENMT: Mucous membranes are moist. Posterior pharynx clear of any exudate or lesions.Normal dentition.  Neck: normal, supple, no masses, no thyromegaly Respiratory: clear to auscultation bilaterally, no wheezing, no crackles. Normal respiratory effort. No accessory muscle  use.  Cardiovascular: Regular rate and rhythm, no murmurs / rubs / gallops. No extremity edema. 2+ pedal pulses. No carotid bruits.  His port on right upper chest.  No redness or swelling noted around port site. Abdomen: no tenderness, no masses palpated. No hepatosplenomegaly. Bowel sounds positive.  Musculoskeletal: no clubbing / cyanosis. No joint deformity upper and lower extremities. Good ROM, no contractures. Normal muscle tone.  Skin: no rashes, lesions, ulcers. No induration Neurologic: CN 2-12 grossly intact. Sensation intact, DTR normal. Strength 5/5 in all 4.  Psychiatric: Normal judgment and insight. Alert and oriented x 3. Normal mood.    Labs on Admission: I have personally reviewed following labs and imaging studies  CBC: Recent Labs  Lab 10/20/19 0950 10/21/19 1028  WBC 2.8* 2.4*  NEUTROABS 2.0 1.9  HGB 7.2* 9.4*  HCT 21.4* 27.2*  MCV 93.9 93.5  PLT 10* 14*   Basic Metabolic Panel: Recent Labs  Lab 10/20/19 0950 10/21/19 1028  NA 141 140  K 3.8 3.0*  CL 103 104  CO2 27 22  GLUCOSE 98 123*  BUN 24* 26*  CREATININE 1.13 1.24  CALCIUM 10.3 9.5   GFR: Estimated Creatinine Clearance: 47.3 mL/min (by C-G formula based on SCr of 1.24 mg/dL). Liver Function Tests: Recent Labs  Lab 10/20/19 0950 10/21/19 1028  AST 19 42*  ALT 11 18  ALKPHOS 52 50  BILITOT 0.7 0.8  PROT 8.0 7.9  ALBUMIN 3.6 3.4*   No results for input(s): LIPASE, AMYLASE in the last 168 hours. No results for input(s): AMMONIA in the last 168 hours. Coagulation Profile: Recent Labs  Lab 10/21/19 1028  INR 1.3*   Cardiac Enzymes: No results for input(s): CKTOTAL, CKMB, CKMBINDEX, TROPONINI in the last 168 hours. BNP (last 3 results) No results for input(s): PROBNP in the last 8760 hours. HbA1C: No results for input(s): HGBA1C in the last 72 hours. CBG: No results for input(s): GLUCAP in the last 168 hours. Lipid Profile: No results for input(s): CHOL, HDL, LDLCALC, TRIG,  CHOLHDL, LDLDIRECT in the last 72 hours. Thyroid Function Tests: No results for input(s): TSH, T4TOTAL, FREET4, T3FREE, THYROIDAB in the last 72 hours. Anemia Panel: No results for input(s): VITAMINB12, FOLATE, FERRITIN, TIBC, IRON, RETICCTPCT in the last 72 hours.  Urine analysis:    Component Value Date/Time   COLORURINE AMBER (A) 10/21/2019 1028   APPEARANCEUR CLOUDY (A) 10/21/2019 1028   LABSPEC 1.015 10/21/2019 1028   PHURINE 5.0 10/21/2019 1028   GLUCOSEU NEGATIVE 10/21/2019 1028   HGBUR LARGE (A) 10/21/2019 1028   HGBUR small 07/07/2007 0000   BILIRUBINUR NEGATIVE 10/21/2019 1028   KETONESUR NEGATIVE 10/21/2019 1028   PROTEINUR 100 (A) 10/21/2019 1028   UROBILINOGEN 0.2 07/07/2007 0000   NITRITE NEGATIVE 10/21/2019 1028   LEUKOCYTESUR NEGATIVE 10/21/2019 1028    Radiological Exams on Admission: DG Chest Port 1 View  Result Date: 10/21/2019 CLINICAL DATA:  Fever, generalized weakness EXAM: PORTABLE CHEST 1 VIEW COMPARISON:  11/18/2015 FINDINGS: Right-sided chest port terminates at the level of the distal SVC. Heart size is normal. Atherosclerotic calcification of the aortic knob. Minimal linear bibasilar atelectasis. No focal airspace consolidation, pleural effusion, or pneumothorax. Bilateral reverse shoulder arthroplasties. IMPRESSION: Minimal bibasilar atelectasis. Electronically Signed   By: Davina Poke D.O.   On: 10/21/2019 10:22    EKG: Independently reviewed.  Sinus tachycardia.  No ST elevation or depression noted.  Assessment/Plan Principal Problem:   Sepsis ([place]) Active Problems:   Multiple myeloma ([facility])   Hyperlipidemia   Essential hypertension   GERD (gastroesophageal reflux disease)   Pancytopenia ([place])    Sepsis: -In the setting of COVID-19.  Patient is fully vaccinated against OVPCH-40 however he presented with  fever of 101.4, tachycardic, tachypneic, lactic acid of 2.0, WBC of 2.4.  Respiratory panel positive for COVID-19., chest x-ray negative  for pneumonia.  He is maintaining oxygen saturation on room air -Patient received IV fluid, cefepime, vancomycin and Flagyl in ED. -BC, UC, procalcitonin level: Pending -Admit patient stepdown unit for close monitoring.  On telemetry. -Check inflammatory markers.  Start patient on remdesivir. -Patient maintaining oxygen saturation on room air-no indication of steroids or baricitinib at this time.  Hold IV fluids and IV antibiotics at this time.  Follow inflammatory markers. -Start on p.o. vitamins. -Monitor vitals closely.  Hypertension: Blood pressure is on lower side. -Hold metoprolol for now.  Monitor blood pressure closely and resume metoprolol once blood pressure is back to baseline  Multiple myeloma: -Currently getting chemo.  Received blood transfusion yesterday -Followed by oncology Dr. Marin Olp outpatient. -EDP consulted oncology-await recommendations  Pancytopenia: -In the setting of multiple myeloma -WBC 2.4, H&H: 9.4/27.2, platelet: 14 -No signs of active bleeding -Await oncology recommendations.  GERD: PPI due to severe thrombocytopenia  Depression/anxiety: Continue Remeron, Ativan as needed  BPH: Continue Flomax  Poor appetite: Continue Marinol  Please note: Patient initial troponin came back elevated-253.  Patient denies ACS symptoms.  Likely demand ischemia secondary to underlying infection.  Will trend troponin and will get stat EKG.  DVT prophylaxis: SCD, no chemical anticoagulation due to severe thrombocytopenia Code Status: DNR-confirmed with the patient Family Communication: None present at bedside.  Plan of care discussed with patient in length and he verbalized understanding and agreed with it. Disposition Plan: Home in 2 to 3 days Consults called: Oncology by EDP Admission status: Inpatient   Mckinley Jewel MD [facility]  If 7PM-7AM, please contact night-coverage [email or web address] Password [facility]  10/21/2019, 1:43 PM

## 2019-10-21 NOTE — ED Notes (Signed)
Date and time results received: 10/21/19 11:32 AM    Test: Lactic Acid Critical Value: 2.0  Name of Provider Notified: Ray  Orders Received? Or Actions Taken?:

## 2019-10-21 NOTE — ED Provider Notes (Signed)
[facility] DEPT Provider Note   CSN: [id number] Arrival date & time: 10/21/19  9678     History Chief Complaint  Patient presents with  . Fever    Gene Hill is a 79 y.o. male.  HPI     79 year old male history of multiple myeloma on current chemo therapeutics with last infusion 921.  Patient reports that he was called back to have blood transfusion yesterday.  He states that during the night he began feeling extremely weak.  He thinks that he had a fever.  He describes the weakness is diffuse in his legs feeling like water.  He received his second Covid vaccine within this past week.  He denies any nasal congestion, cough, abdominal pain, nausea, vomiting, diarrhea, or urinary tract infection symptoms.  He states he has not had an appetite today.  He was transported here via EMS.  He did EMS reported his temperature of 100.4 in route.  Past Medical History:  Diagnosis Date  . Arthritis   . 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 Blood dyscrasia    low WBC d/t multiple myeloma  . Bulging lumbar disc    L2-3  . Carotid bruit    3-11 carotid u/s was  (-)  . 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 GERD (gastroesophageal reflux disease)   . Headache   . Heart murmur    doesn't have one  . History of hiatal hernia   . HYPERLIPIDEMIA 05/03/2006  . HYPERTENSION 05/03/2006  . 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 Torn rotator cuff 01/22/2017   left- [place]    Patient Active Problem List   Diagnosis Date Noted  . 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 Primary localized osteoarthritis of right knee 12/23/2015  . Acute left-sided thoracic  back pain   . Atherosclerosis of native coronary artery of native heart without angina pectoris   . PCP NOTES >>>>>>>>>>>>>>>>>>>>>>>> 08/22/2015  . Arthritis-- aches, pain 02/05/2014  . Dizziness 02/05/2014  . 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 Annual physical exam 08/14/2010  . 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 Multiple myeloma not having achieved remission ([place]) 04/15/2009  . CAROTID BRUIT 04/14/2009  . 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 Diverticulosis of colon 08/13/2008  . Backache 07/22/2006  . FATIGUE 07/22/2006  . Hyperlipidemia 05/03/2006  . Essential hypertension 05/03/2006  . Coronary atherosclerosis 05/03/2006  . COLONOSCOPY WITH BIOPSY, HX OF 09/22/2002  . Other personal history presenting hazards to health 09/22/2002    Past Surgical History:  Procedure Laterality Date  . BACK SURGERY    . CERVICAL FUSION    . IR FLUORO GUIDE PORT INSERTION RIGHT  11/29/2016  . IR US GUIDE VASC ACCESS RIGHT  11/29/2016  . [place]   right  . REVERSE SHOULDER ARTHROPLASTY Right 06/25/2018   Procedure: REVERSE SHOULDER ARTHROPLASTY;  Surgeon: Hiram Gash, MD;  Location: [facility];  Service: Orthopedics;  Laterality: Right;  . REVERSE SHOULDER ARTHROPLASTY Left 11/19/2018   Procedure: REVERSE SHOULDER ARTHROPLASTY;  Surgeon: Hiram Gash, MD;  Location: [facility];  Service: Orthopedics;  Laterality: Left;  . ROTATOR CUFF REPAIR     right  . SPINAL FUSION     lumbar  . TOTAL KNEE ARTHROPLASTY Right 12/23/2015   Procedure: TOTAL KNEE ARTHROPLASTY;  Surgeon: Earlie Server, MD;  Location: [facility];  Service: Orthopedics;  Laterality: Right;  . TOTAL KNEE REVISION Right 02/18/2019  . TOTAL KNEE REVISION Right 02/18/2019   Procedure: TOTAL KNEE REVISION;  Surgeon: Earlie Server, MD;  Location: [facility];  Service: Orthopedics;  Laterality: Right;       Family History  Problem Relation Age of  Onset  . Kidney cancer Father   . Leukemia Mother   . Pulmonary embolism Brother   . Stroke Maternal Grandmother   . Heart attack Maternal Grandfather   . Stroke Paternal Grandmother   . Cerebral palsy Son   . Prostate cancer Neg Hx   . Colon cancer Neg Hx   . Esophageal cancer Neg Hx   . Rectal cancer Neg Hx   . Stomach cancer Neg Hx     Social History   Tobacco Use  . Smoking status: Former Smoker    Packs/day: 0.25    Years: 20.00    Pack years: 5.00    Types: Cigarettes    Start date: 04/28/1954    Quit date: 11/28/1974    Years since quitting: 44.9  . Smokeless tobacco: Never Used  . Tobacco comment: quit 35 years ago, 1976  Vaping Use  . Vaping Use: Never used  Substance Use Topics  . Alcohol use: Yes    Alcohol/week: 0.0 standard drinks    Comment: occ  . Drug use: No    Home Medications Prior to Admission medications   Medication Sig Start Date End Date Taking? Authorizing Provider  acetaminophen (TYLENOL) 500 MG tablet Take 1,000 mg by mouth 3 (three) times daily as needed.    [provider]  aspirin 325 MG tablet Take 325 mg by mouth daily.    [provider]  b complex vitamins tablet Take 1 tablet by mouth daily.    [provider]  calcium carbonate (OSCAL) 1500 (600 Ca) MG TABS tablet Take 600 mg of elemental calcium by mouth daily with breakfast.     [provider]  Cyanocobalamin (VITAMIN B-12 PO) Take 5,000 mcg by mouth daily.     [provider]  dronabinol (MARINOL) 2.5 MG capsule TAKE 1 CAPSULE(2.5 MG) BY MOUTH TWICE DAILY BEFORE A MEAL 09/14/19   [name], MD  famciclovir (FAMVIR) 250 MG tablet Take 1 tablet (250 mg total) by mouth daily. 09/14/19   [name], MD  methocarbamol (ROBAXIN) 500 MG tablet Take 1 tablet (500 mg total) by mouth 3 (three) times daily as needed for muscle spasms. Patient not taking: Reported on 09/29/2019 09/14/19   Colon Branch, MD  metoprolol tartrate (LOPRESSOR) 50  MG tablet Take 1 tablet (50 mg total) by mouth 2 (two) times daily. 03/23/19   Colon Branch, MD  mirtazapine (REMERON) 7.5 MG tablet Take 1 tablet (7.5 mg total) by mouth at bedtime. 09/14/19   [name], MD  Multiple Vitamin (MULTIVITAMIN) tablet Take 1 tablet by mouth daily.    [provider]  Omega-3 Fatty Acids (FISH OIL) 1200 MG CAPS Take 1,200 mg by mouth daily.    [provider]  oxybutynin (DITROPAN-XL) 10 MG 24 hr tablet Take 10 mg by mouth daily. 06/25/19   [provider]  pantoprazole (PROTONIX) 40 MG tablet Take 1 tablet (40 mg total) by mouth at bedtime. 06/17/19   Colon Branch, MD  Probiotic Product (PROBIOTIC PO) Take 1 tablet by mouth daily.     [provider]  tamsulosin (FLOMAX) 0.4 MG CAPS capsule Take 0.4 mg by mouth daily.    [provider]  traMADol (ULTRAM) 50 MG tablet Take 1 tablet (50 mg total) by mouth every 6 (six) hours as needed. 90 day supply please 09/14/19   [name], MD  prochlorperazine (COMPAZINE) 10 MG tablet Take 1 tablet (10 mg total) by mouth every 6 (six) hours as needed (Nausea or vomiting). Patient not taking: Reported on 09/29/2019 08/24/19 10/07/19  [name], MD    Allergies    Patient has no known allergies.  Review of Systems   Review of Systems  All other systems reviewed and are negative.   Physical Exam Updated Vital Signs BP 128/84 (BP Location: Left Arm)   Pulse (!) 117   Temp (!) 101.4 F (38.6 C) (Rectal)   Resp (!) 24   SpO2 99%   Physical Exam Vitals and nursing note reviewed.  Constitutional:      Appearance: Normal appearance.  HENT:     Head: Normocephalic.     Right Ear: External ear normal.     Left Ear: External ear normal.     Nose: Nose normal.     Mouth/Throat:     Mouth: Mucous membranes are dry.  Eyes:     Extraocular Movements: Extraocular movements intact.     Pupils: Pupils are equal, round, and reactive to light.  Cardiovascular:     Rate and  Rhythm: Regular rhythm. Tachycardia present.  Pulmonary:     Effort: Pulmonary effort is normal.     Breath sounds: Normal breath sounds.  Abdominal:     General: Abdomen is flat.     Palpations: Abdomen is soft.  Musculoskeletal:        General: Normal range of motion.     Cervical back: Normal range of motion.  Skin:    General: Skin is warm.     Capillary Refill: Capillary refill takes less than 2 seconds.  Neurological:     General: No focal deficit present.     Mental Status: He is alert and oriented to person, place, and time.  Psychiatric:        Mood and Affect: Mood normal.        Behavior: Behavior normal.     ED Results / Procedures / Treatments   Labs (all labs ordered are listed, but only abnormal results are displayed) Labs Reviewed  CULTURE, BLOOD (ROUTINE X 2)  CULTURE, BLOOD (ROUTINE X 2)  URINE CULTURE  LACTIC ACID, PLASMA  LACTIC ACID, PLASMA  COMPREHENSIVE METABOLIC PANEL  CBC WITH DIFFERENTIAL/PLATELET  PROTIME-INR  APTT  URINALYSIS, ROUTINE W REFLEX MICROSCOPIC  POC SARS CORONAVIRUS 2 AG -  ED    EKG EKG Interpretation  Date/Time:  Wednesday October 21 2019 09:57:37 EDT Ventricular Rate:  115 PR Interval:    QRS Duration: 79 QT Interval:  302 QTC Calculation: 418 R Axis:   40 Text Interpretation: Sinus tachycardia Abnormal R-wave progression, early transition Minimal ST depression, anterolateral leads Confirmed by Pattricia Boss [phone]) on 10/21/2019 10:25:03 AM   Radiology DG Chest Port 1 View  Result Date: 10/21/2019 CLINICAL DATA:  Fever, generalized weakness EXAM: PORTABLE CHEST 1 VIEW COMPARISON:  11/18/2015 FINDINGS: Right-sided chest port terminates at  the level of the distal SVC. Heart size is normal. Atherosclerotic calcification of the aortic knob. Minimal linear bibasilar atelectasis. No focal airspace consolidation, pleural effusion, or pneumothorax. Bilateral reverse shoulder arthroplasties. IMPRESSION: Minimal bibasilar  atelectasis. Electronically Signed   By: Davina Poke D.O.   On: 10/21/2019 10:22    Procedures .Critical Care Performed by: Pattricia Boss, MD Authorized by: Pattricia Boss, MD   Critical care provider statement:    Critical care time (minutes):  45   Critical care end time:  10/21/2019 12:32 PM   Critical care was necessary to treat or prevent imminent or life-threatening deterioration of the following conditions:  Sepsis   Critical care was time spent personally by me on the following activities:  Discussions with consultants, evaluation of patient's response to treatment, examination of patient, ordering and performing treatments and interventions, ordering and review of laboratory studies, ordering and review of radiographic studies, pulse oximetry, re-evaluation of patient's condition, obtaining history from patient or surrogate and review of old charts   (including critical care time)  Medications Ordered in ED Medications  lactated ringers infusion (has no administration in time range)  ceFEPIme (MAXIPIME) 2 g in sodium chloride 0.9 % 100 mL IVPB (has no administration in time range)  metroNIDAZOLE (FLAGYL) IVPB 500 mg (has no administration in time range)  vancomycin (VANCOCIN) IVPB 1000 mg/200 mL premix (has no administration in time range)  lactated ringers bolus 1,000 mL (has no administration in time range)    ED Course  I have reviewed the triage vital signs and the nursing notes.  Pertinent labs & imaging results that were available during my care of the patient were reviewed by me and considered in my medical decision making (see chart for details).  Clinical Course as of Oct 20 1137  Wed Oct 21, 2019  1135 Thrombocytopenia noted.  No active bleeding  Platelets(!!): 14 [DR]  1136 Patient's platelets increased from 10-14,000 today.  Yesterday were 10,000.   [DR]  1137 Patient received platelets at oncology clinic yesterday.   [DR]  1137 Patient placed on cell  count is low at 2400 with absolute neutrophil count 1650.   [DR]  1137 Hemoglobin(!): 9.4 [DR]  1138 Hemoglobin is increased by 2 g from yesterday's measurement pretransfusion   [DR]    Clinical Course User Index [DR] Pattricia Boss, MD   MDM Rules/Calculators/A&P                         Sepsis - Repeat Assessment  Performed at:    12:31 PM   Vitals     Blood pressure 137/76, pulse (!) 105, temperature (!) 101.4 F (38.6 C), temperature source Rectal, resp. rate (!) 25, SpO2 96 %.  Heart:     Tachycardic  Lungs:    CTA  Capillary Refill:   <2 sec  Peripheral Pulse:   Radial pulse palpable  Skin:     Pale    Patient with mm and on chemotherapy with known pancytopenia presents today with fever and weakness.  Broad spectrum abx initiated.  Patient tachycardiac with fever,normotensive.  Patient has received 2 liters ns. Lactic 2 on first draw Kidney function normal but increased from baseline CXR clear, patient is vaccinated against covid- poc covid negative, pcr pending Plan admission for ongoing evaluation and treatment Discussed with Dr. Doristine Bosworth covid returned positive Oncology paged Discussed with Dr. Pollyann Savoy.  She advises no platelet transfusion unless platelets are under 10 without active bleeding.  Transfuse hemoglobin as needed to support patient. She will alert Dr. Marin Olp to consult  Gene Hill was evaluated in Emergency Department on 10/21/2019 for the symptoms described in the history of present illness. He was evaluated in the context of the global COVID-19 pandemic, which necessitated consideration that the patient might be at risk for infection with the SARS-CoV-2 virus that causes COVID-19. Institutional protocols and algorithms that pertain to the evaluation of patients at risk for COVID-19 are in a state of rapid change based on information released by regulatory bodies including the CDC and federal and state organizations. These policies and algorithms were  followed during the patient's care in the ED.  Final Clinical Impression(s) / ED Diagnoses Final diagnoses:  Sepsis, due to unspecified organism, unspecified whether acute organ dysfunction present ([place])  Pancytopenia ([place])  COVID-19    Rx / DC Orders ED Discharge Orders    None       Pattricia Boss, MD 10/21/19 1449

## 2019-10-21 NOTE — ED Triage Notes (Signed)
Pt arrived via EMS, from home, cc fever and generalized wkns since last night. Received second dose COVID vaccine 9/24. Denies any n/v or diarrhea.

## 2019-10-21 NOTE — ED Notes (Signed)
Notified Pahwani, MD and Denton Ar, RN that pt is Covid+

## 2019-10-21 NOTE — Progress Notes (Signed)
Received phone call about consult on this patient of Dr. Marin Olp I recommend full admission and management of neutropenic fever. No need to transfuse unless platelet count is less than 10,000 or bleeding Dr. Marin Olp is informed of his admission

## 2019-10-21 NOTE — Progress Notes (Signed)
A consult was received from an ED physician for Cefepime and vancomycin per pharmacy dosing.  The patient's profile has been reviewed for ht/wt/allergies/indication/available labs.   A one time order has been placed for Cefepime 2g IV, Vancomycin 1500mg  IV .  Further antibiotics/pharmacy consults should be ordered by admitting physician if indicated.                   Thank you, Gretta Arab PharmD, BCPS Clinical Pharmacist [facility] main pharmacy [phone] 10/21/2019 10:17 AM

## 2019-10-21 NOTE — Progress Notes (Signed)
Pharmacy Antibiotic Note  Gene Hill is a 79 y.o. male admitted on 10/21/2019 with suspected sepsis from an unknown source.  PMH significant for multiple myeloma (undergoing chemo).  Pharmacy has been consulted for Vancomycin and Cefepime dosing.  Plan:  Cefepime 2gm IV q12h  Vancomycin 749m IV q12h  Follow renal function  F/u culture results/sensitivities  Monitor vancomycin trough levels as needed     Temp (24hrs), Avg:99.2 F (37.3 C), Min:98.3 F (36.8 C), Max:101.4 F (38.6 C)  Recent Labs  Lab 10/20/19 0950 10/21/19 1028  WBC 2.8* 2.4*  CREATININE 1.13 1.24  LATICACIDVEN  --  2.0*  2.0*    Estimated Creatinine Clearance: 47.3 mL/min (by C-G formula based on SCr of 1.24 mg/dL).    No Known Allergies  Antimicrobials this admission: 9/29 Metronidazole x 1 9/29 Vanc >>   9/29 Cefepime >>  Dose adjustments this admission:   Microbiology results: 9/29 BCx:   9/29 UCx:    9/29 Resp Panel:  Thank you for allowing pharmacy to be a part of this patient's care.  PEverette Rank PharmD 10/21/2019 1:50 PM

## 2019-10-21 NOTE — ED Notes (Signed)
Pt spouse updated on POC and pt condition.

## 2019-10-21 NOTE — ED Notes (Signed)
Date and time results received: 10/21/19 1707   Test: Troponin  Critical Value: 253  Name of Provider Notified:Pahwani

## 2019-10-22 DIAGNOSIS — A403 Sepsis due to Streptococcus pneumoniae: Secondary | ICD-10-CM

## 2019-10-22 DIAGNOSIS — R6521 Severe sepsis with septic shock: Secondary | ICD-10-CM

## 2019-10-22 DIAGNOSIS — N178 Other acute kidney failure: Secondary | ICD-10-CM

## 2019-10-22 DIAGNOSIS — C9002 Multiple myeloma in relapse: Secondary | ICD-10-CM

## 2019-10-22 LAB — BLOOD CULTURE ID PANEL (REFLEXED) - BCID2

## 2019-10-22 LAB — BPAM RBC
Blood Product Expiration Date: 202110162359
Blood Product Expiration Date: 202110162359
ISSUE DATE / TIME: 202109281340
ISSUE DATE / TIME: 202109290800
Unit Type and Rh: 6200
Unit Type and Rh: 6200

## 2019-10-22 LAB — CBC WITH DIFFERENTIAL/PLATELET
Abs Immature Granulocytes: 0.1 10*3/uL — ABNORMAL HIGH (ref 0.00–0.07)
Basophils Absolute: 0 10*3/uL (ref 0.0–0.1)
Basophils Relative: 0 %
Eosinophils Absolute: 0 10*3/uL (ref 0.0–0.5)
Eosinophils Relative: 0 %
HCT: 18 % — ABNORMAL LOW (ref 39.0–52.0)
Hemoglobin: 6.3 g/dL — CL (ref 13.0–17.0)
Immature Granulocytes: 8 %
Lymphocytes Relative: 9 %
Lymphs Abs: 0.1 10*3/uL — ABNORMAL LOW (ref 0.7–4.0)
MCH: 32.8 pg (ref 26.0–34.0)
MCHC: 35 g/dL (ref 30.0–36.0)
MCV: 93.8 fL (ref 80.0–100.0)
Monocytes Absolute: 0.1 10*3/uL (ref 0.1–1.0)
Monocytes Relative: 11 %
Neutro Abs: 0.9 10*3/uL — ABNORMAL LOW (ref 1.7–7.7)
Neutrophils Relative %: 72 %
Platelets: 10 10*3/uL — CL (ref 150–400)
RBC: 1.92 MIL/uL — ABNORMAL LOW (ref 4.22–5.81)
RDW: 19.9 % — ABNORMAL HIGH (ref 11.5–15.5)
WBC: 1.2 10*3/uL — CL (ref 4.0–10.5)
nRBC: 0 % (ref 0.0–0.2)

## 2019-10-22 LAB — TYPE AND SCREEN
ABO/RH(D): A POS
Antibody Screen: NEGATIVE
Unit division: 0
Unit division: 0

## 2019-10-22 LAB — PROCALCITONIN: Procalcitonin: 3.68 ng/mL

## 2019-10-22 LAB — COMPREHENSIVE METABOLIC PANEL
ALT: 19 U/L (ref 0–44)
AST: 54 U/L — ABNORMAL HIGH (ref 15–41)
Albumin: 2.7 g/dL — ABNORMAL LOW (ref 3.5–5.0)
Alkaline Phosphatase: 40 U/L (ref 38–126)
Anion gap: 13 (ref 5–15)
BUN: 22 mg/dL (ref 8–23)
CO2: 20 mmol/L — ABNORMAL LOW (ref 22–32)
Calcium: 8.9 mg/dL (ref 8.9–10.3)
Chloride: 106 mmol/L (ref 98–111)
Creatinine, Ser: 1.1 mg/dL (ref 0.61–1.24)
GFR calc Af Amer: >60 mL/min (ref >60)
GFR calc non Af Amer: >60 mL/min (ref >60)
Glucose, Bld: 99 mg/dL (ref 70–99)
Potassium: 3.4 mmol/L — ABNORMAL LOW (ref 3.5–5.1)
Sodium: 139 mmol/L (ref 135–145)
Total Bilirubin: 1.3 mg/dL — ABNORMAL HIGH (ref 0.3–1.2)
Total Protein: 6.9 g/dL (ref 6.5–8.1)

## 2019-10-22 LAB — PHOSPHORUS: Phosphorus: 2.2 mg/dL — ABNORMAL LOW (ref 2.5–4.6)

## 2019-10-22 LAB — D-DIMER, QUANTITATIVE: D-Dimer, Quant: 0.7 ug/mL-FEU — ABNORMAL HIGH (ref 0.00–0.50)

## 2019-10-22 LAB — PROTIME-INR
INR: 1.6 — ABNORMAL HIGH (ref 0.8–1.2)
Prothrombin Time: 18.7 seconds — ABNORMAL HIGH (ref 11.4–15.2)

## 2019-10-22 LAB — PREPARE RBC (CROSSMATCH)

## 2019-10-22 LAB — CORTISOL-AM, BLOOD: Cortisol - AM: 21.2 ug/dL (ref 6.7–22.6)

## 2019-10-22 LAB — C-REACTIVE PROTEIN: CRP: 24.8 mg/dL — ABNORMAL HIGH (ref <1.0)

## 2019-10-22 LAB — FERRITIN: Ferritin: 1831 ng/mL — ABNORMAL HIGH (ref 24–336)

## 2019-10-22 LAB — MAGNESIUM: Magnesium: 1.8 mg/dL (ref 1.7–2.4)

## 2019-10-22 MED ORDER — POTASSIUM PHOSPHATES 15 MMOLE/5ML IV SOLN
30.0000 mmol | Freq: Once | INTRAVENOUS | Status: AC
  Administered 2019-10-22: 30 mmol via INTRAVENOUS
  Filled 2019-10-22: qty 10

## 2019-10-22 MED ORDER — SODIUM CHLORIDE 0.9% IV SOLUTION: Freq: Once | INTRAVENOUS | Status: AC

## 2019-10-22 NOTE — Progress Notes (Signed)
PHARMACY - PHYSICIAN COMMUNICATION CRITICAL VALUE ALERT - BLOOD CULTURE IDENTIFICATION (BCID)  Gene Hill is an 79 y.o. male who presented to [facility] on 10/21/2019 with a chief complaint of not feeling well, fever, weakness.  PMH of multiple myeloma with most recent chemo, Bendamustine given on 10/13/19, Pegfilgrastim on 9/23, s/p blood transfusion on 9/28, s/p second COVID vaccine on 9/24.   Assessment: Admitted with sepsis, unknown source, fever, and pancytopenia related to chemotherapy.  Found to be COVID positive.  Blood cultures now positive for Staph Epi, MecA positive, in 1/4 bottles, likely contaminant.  However, given immunocompromise (ANC decreased to 0.9 today) and elevated Procalcitonin (4.33 > 3.68), reasonable to continue Cefepime, but no need to escalate coverage.  Name of physician (or Provider) Contacted: Dr. Sherral Hammers  Current antimicrobials: Cefepime, Remdesivir  Changes to prescribed antibiotics recommended:  Continue Cefepime for now, no changes needed.  Results for orders placed or performed during the hospital encounter of 10/21/19  Blood Culture ID Panel (Reflexed) (Collected: 10/21/2019 10:30 AM)  Result Value Ref Range   Enterococcus faecalis NOT DETECTED NOT DETECTED   Enterococcus Faecium NOT DETECTED NOT DETECTED   Listeria monocytogenes NOT DETECTED NOT DETECTED   Staphylococcus species DETECTED (A) NOT DETECTED   Staphylococcus aureus (BCID) NOT DETECTED NOT DETECTED   Staphylococcus epidermidis DETECTED (A) NOT DETECTED   Staphylococcus lugdunensis NOT DETECTED NOT DETECTED   Streptococcus species NOT DETECTED NOT DETECTED   Streptococcus agalactiae NOT DETECTED NOT DETECTED   Streptococcus pneumoniae NOT DETECTED NOT DETECTED   Streptococcus pyogenes NOT DETECTED NOT DETECTED   A.calcoaceticus-baumannii NOT DETECTED NOT DETECTED   Bacteroides fragilis NOT DETECTED NOT DETECTED   Enterobacterales NOT DETECTED NOT DETECTED   Enterobacter cloacae complex  NOT DETECTED NOT DETECTED   Escherichia coli NOT DETECTED NOT DETECTED   Klebsiella aerogenes NOT DETECTED NOT DETECTED   Klebsiella oxytoca NOT DETECTED NOT DETECTED   Klebsiella pneumoniae NOT DETECTED NOT DETECTED   Proteus species NOT DETECTED NOT DETECTED   Salmonella species NOT DETECTED NOT DETECTED   Serratia marcescens NOT DETECTED NOT DETECTED   Haemophilus influenzae NOT DETECTED NOT DETECTED   Neisseria meningitidis NOT DETECTED NOT DETECTED   Pseudomonas aeruginosa NOT DETECTED NOT DETECTED   Stenotrophomonas maltophilia NOT DETECTED NOT DETECTED   Candida albicans NOT DETECTED NOT DETECTED   Candida auris NOT DETECTED NOT DETECTED   Candida glabrata NOT DETECTED NOT DETECTED   Candida krusei NOT DETECTED NOT DETECTED   Candida parapsilosis NOT DETECTED NOT DETECTED   Candida tropicalis NOT DETECTED NOT DETECTED   Cryptococcus neoformans/gattii NOT DETECTED NOT DETECTED   Methicillin resistance mecA/C DETECTED (A) NOT DETECTED    Gretta Arab PharmD, BCPS Clinical Pharmacist [facility] main pharmacy [phone] 10/22/2019 7:26 AM

## 2019-10-22 NOTE — ED Notes (Signed)
Offered patient food, states he is not hungry. Given ginger ale, repositioned in bed.

## 2019-10-22 NOTE — Progress Notes (Signed)
PROGRESS NOTE    Gene Hill  OBS:[id number] DOB: Sep 11, 1940 DOA: 10/21/2019 PCP: Colon Branch, MD     Brief Narrative:    79 y.o. WM PMHx Multiple myeloma, HTN, CAD, s/p cath 1998, HLD, GERD,  lower back pain, arthritis, pancytopenia   Presents to emergency department with generalized weakness and not feeling well overall.  Patient tells me that he has not been feeling well, has fever and generalized weakness started last night. He received blood transfusion yesterday. Reports that last night he was feeling extremely weak and had a fever. He received second dose of COVID-19 vaccine on Friday and thinks that his symptoms could be related to his second shot. Reports decreased appetite as well.  He denies headache, blurry vision, lightheadedness, dizziness, cough, congestion, nausea, vomiting, abdominal pain, urinary or bowel changes. Denies melena, hematemesis, hemoptysis, epistaxis, petechiae or bruises on his body.  He has a port on right upper chest however he denies redness or swelling around the port site.  No history of smoking, alcohol, illicit drug use. His wife called EMS and reported his temperature was 100.4 in route to ED.  ED Course: Upon arrival to ED: Patient had fever of 101.4, tachycardic, tachypneic, lactic acid of 2.0, WBC of 2.4, platelet: 14, potassium: 3.0, UA positive for rare bacteria, POC COVID-19 negative. Chest x-ray shows minimal bibasilar atelectasis. Patient received IV fluid, cefepime, vancomycin, Flagyl and potassium in ED. [facility] consulted for admission for sepsis secondary to unknown cause.   Subjective: T-max overnight 38.6 C, A/O x4, patient says Dr. Marin Olp oncology sent him over to the ED.  Negative abdominal pain, negative nausea, negative vomiting, negative S OB   Assessment & Plan: Covid vaccination; vaccinated second shot on Friday   Principal Problem:   Sepsis ([place]) Active Problems:   Multiple myeloma ([place])    Hyperlipidemia   Essential hypertension   GERD (gastroesophageal reflux disease)   Pancytopenia ([place])    Sepsis: -In the setting of COVID-19.  Patient is fully vaccinated against UTMLY-65 however he presented with  fever of 101.4, tachycardic, tachypneic, lactic acid of 2.0, WBC of 2.4.  Respiratory panel positive for COVID-19., chest x-ray negative for pneumonia.  He is maintaining oxygen saturation on room air -Patient received IV fluid, cefepime, vancomycin and Flagyl in ED. -BC, UC, procalcitonin level: Pending -Admit patient stepdown unit for close monitoring.  On telemetry. -Check inflammatory markers.  Start patient on remdesivir. -Patient maintaining oxygen saturation on room air-no indication of steroids or baricitinib at this time.  Hold IV fluids and IV antibiotics at this time.  Follow inflammatory markers. -Start on p.o. vitamins. -Monitor vitals closely.  Covid infection COVID-19 Labs  Recent Labs    10/21/19 1606 10/22/19 0330  DDIMER 0.77* 0.70*  FERRITIN 1,489* 1,831*  LDH 263*  --   CRP 17.6* 24.8*    Lab Results  Component Value Date   SARSCOV2NAA POSITIVE (A) 10/21/2019   SARSCOV2NAA NEGATIVE 02/16/2019   SARSCOV2NAA NOT DETECTED 11/15/2018   [place] NEGATIVE 06/23/2018  -Remdesivir per pharmacy protocol -Vitamin C and zinc per Covid protocol -Patient does not meet criteria for Steroids or Baricitinib  Hypertension: Blood pressure is on lower side. -Hold metoprolol for now.  Monitor blood pressure closely and resume metoprolol once blood pressure is back to baseline  Multiple myeloma: -Currently getting chemo.  Received blood transfusion yesterday -Followed by oncology Dr. Marin Olp oncology outpatient. -EDP consulted oncology-await recommendations  Pancytopenia: -In the setting of multiple myeloma -WBC 2.4, H&H: 9.4/27.2,  platelet: 14 -No signs of active bleeding -Await oncology recommendations.  GERD: PPI due to severe  thrombocytopenia  Depression/anxiety: Continue Remeron, Ativan as needed  BPH: Continue Flomax  Poor appetite: Continue Marinol  Please note: Patient initial troponin came back elevated-253.  Patient denies ACS symptoms.  Likely demand ischemia secondary to underlying infection.  Will trend troponin and will get stat EKG.  Hypokalemia -Potassium goal> 4 -K-Phos 30 mmol  Hypophosphatemia -Phosphorus goal> 2.5 -See hypokalemia   DVT prophylaxis: SCD, no chemical anticoagulation due to severe thrombocytopenia Code Status: DNR Family Communication:  Status is: Inpatient    Dispo: The patient is from: Home              Anticipated d/c is to: Home              Anticipated d/c date is: 10/4              Patient currently unstable      Consultants:  Dr. Marin Olp oncology  Procedures/Significant Events:    I have personally reviewed and interpreted all radiology studies and my findings are as above.  VENTILATOR SETTINGS: Room air 9/30 SPO2 95%   Cultures 9/29 SARS coronavirus positive 9/29 influenza A/B negative   Antimicrobials: Anti-infectives (From admission, onward)   Start     Dose/Rate Route Frequency Ordered Stop   10/22/19 1000  remdesivir 100 mg in sodium chloride 0.9 % 100 mL IVPB  Status:  Discontinued       "Followed by" Linked Group Details   100 mg 200 mL/hr over 30 Minutes Intravenous Daily 10/21/19 1421 10/21/19 1426   10/22/19 1000  remdesivir 100 mg in sodium chloride 0.9 % 100 mL IVPB       "Followed by" Linked Group Details   100 mg 200 mL/hr over 30 Minutes Intravenous Daily 10/21/19 1428 10/26/19 0959   10/22/19 0100  vancomycin (VANCOREADY) IVPB 750 mg/150 mL  Status:  Discontinued        750 mg 150 mL/hr over 60 Minutes Intravenous Every 12 hours 10/21/19 1350 10/21/19 1422   10/21/19 1500  valACYclovir (VALTREX) tablet 1,000 mg        1,000 mg Oral Daily 10/21/19 1409     10/21/19 1430  remdesivir 200 mg in sodium chloride 0.9%  250 mL IVPB  Status:  Discontinued       "Followed by" Linked Group Details   200 mg 580 mL/hr over 30 Minutes Intravenous Once 10/21/19 1421 10/21/19 1426   10/21/19 1430  remdesivir 200 mg in sodium chloride 0.9% 250 mL IVPB       "Followed by" Linked Group Details   200 mg 580 mL/hr over 30 Minutes Intravenous Once 10/21/19 1428 10/21/19 1633   10/21/19 1400  ceFEPIme (MAXIPIME) 2 g in sodium chloride 0.9 % 100 mL IVPB        2 g 200 mL/hr over 30 Minutes Intravenous Every 12 hours 10/21/19 1350     10/21/19 1345  ceFEPIme (MAXIPIME) 2 g in sodium chloride 0.9 % 100 mL IVPB  Status:  Discontinued        2 g 200 mL/hr over 30 Minutes Intravenous  Once 10/21/19 1343 10/21/19 1346   10/21/19 1345  vancomycin (VANCOCIN) IVPB 1000 mg/200 mL premix  Status:  Discontinued        1,000 mg 200 mL/hr over 60 Minutes Intravenous  Once 10/21/19 1343 10/21/19 1346   10/21/19 1030  vancomycin (VANCOREADY) IVPB 1500 mg/300 mL  Status:  Discontinued        1,500 mg 150 mL/hr over 120 Minutes Intravenous  Once 10/21/19 1017 10/21/19 1448   10/21/19 1015  ceFEPIme (MAXIPIME) 2 g in sodium chloride 0.9 % 100 mL IVPB        2 g 200 mL/hr over 30 Minutes Intravenous  Once 10/21/19 1000 10/21/19 1103   10/21/19 1015  metroNIDAZOLE (FLAGYL) IVPB 500 mg        500 mg 100 mL/hr over 60 Minutes Intravenous  Once 10/21/19 1000 10/21/19 1247   10/21/19 1015  vancomycin (VANCOCIN) IVPB 1000 mg/200 mL premix  Status:  Discontinued        1,000 mg 200 mL/hr over 60 Minutes Intravenous  Once 10/21/19 1000 10/21/19 1017       Devices    LINES / TUBES:      Continuous Infusions:  ceFEPime (MAXIPIME) IV 2 g (10/21/19 2132)   remdesivir 100 mg in NS 100 mL       Objective: Vitals:   10/22/19 0300 10/22/19 0400 10/22/19 0600 10/22/19 0700  BP: (!) 143/70 129/70 (!) 142/69 132/74  Pulse: (!) 111 (!) 107 (!) 107 (!) 113  Resp: (!) 30 (!) 26 (!) 24 (!) 30  Temp:      TempSrc:      SpO2: 95% 96%  97% 95%    Intake/Output Summary (Last 24 hours) at 10/22/2019 0737 Last data filed at 10/22/2019 0353 Gross per 24 hour  Intake 3850 ml  Output 700 ml  Net 3150 ml   There were no vitals filed for this visit.  Examination:  General: A/O x4, No acute respiratory distress Eyes: negative scleral hemorrhage, negative anisocoria, negative icterus ENT: Negative Runny nose, negative gingival bleeding, Neck:  Negative scars, masses, torticollis, lymphadenopathy, JVD Lungs: Clear to auscultation bilaterally without wheezes or crackles, port right chest wall Cardiovascular: Regular rate and rhythm without murmur gallop or rub normal S1 and S2 Abdomen: negative abdominal pain, nondistended, positive soft, bowel sounds, no rebound, no ascites, no appreciable mass Extremities: No significant cyanosis, clubbing, or edema bilateral lower extremities Skin: Negative rashes, lesions, ulcers Psychiatric:  Negative depression, negative anxiety, negative fatigue, negative mania  Central nervous system:  Cranial nerves II through XII intact, negative dysarthria, negative expressive aphasia, negative receptive aphasia.  .     Data Reviewed: Care during the described time interval was provided by me .  I have reviewed this patient's available data, including medical history, events of note, physical examination, and all test results as part of my evaluation.  CBC: Recent Labs  Lab 10/20/19 0950 10/21/19 1028 10/22/19 0330  WBC 2.8* 2.4* 1.2*  NEUTROABS 2.0 1.9 0.9*  HGB 7.2* 9.4* 6.3*  HCT 21.4* 27.2* 18.0*  MCV 93.9 93.5 93.8  PLT 10* 14* 10*   Basic Metabolic Panel: Recent Labs  Lab 10/20/19 0950 10/21/19 1028 10/21/19 1606 10/22/19 0330  NA 141 140 139 139  K 3.8 3.0* 3.2* 3.4*  CL 103 104 106 106  CO2 _0 20*  GLUCOSE 98 123* 109* 99  BUN 24* 26* 25* 22  CREATININE 1.13 1.24 1.17 1.10  CALCIUM 10.3 9.5 8.9 8.9  MG  --  1.9  --  1.8  PHOS  --  3.2  --  2.2*    GFR: Estimated Creatinine Clearance: 53.3 mL/min (by C-G formula based on SCr of 1.1 mg/dL). Liver Function Tests: Recent Labs  Lab 10/20/19 0950 10/21/19 1028 10/21/19 1606 10/22/19 0330  AST 19  42* 45* 54*  ALT _0 ALKPHOS 52 50 44 40  BILITOT 0.7 0.8 1.1 1.3*  PROT 8.0 7.9 7.1 6.9  ALBUMIN 3.6 3.4* 3.0* 2.7*   No results for input(s): LIPASE, AMYLASE in the last 168 hours. No results for input(s): AMMONIA in the last 168 hours. Coagulation Profile: Recent Labs  Lab 10/21/19 1028 10/22/19 0330  INR 1.3* 1.6*   Cardiac Enzymes: No results for input(s): CKTOTAL, CKMB, CKMBINDEX, TROPONINI in the last 168 hours. BNP (last 3 results) No results for input(s): PROBNP in the last 8760 hours. HbA1C: No results for input(s): HGBA1C in the last 72 hours. CBG: No results for input(s): GLUCAP in the last 168 hours. Lipid Profile: No results for input(s): CHOL, HDL, LDLCALC, TRIG, CHOLHDL, LDLDIRECT in the last 72 hours. Thyroid Function Tests: Recent Labs    10/21/19 1028  TSH 0.940   Anemia Panel: Recent Labs    10/21/19 1606 10/22/19 0330  FERRITIN 1,489* 1,831*   Sepsis Labs: Recent Labs  Lab 10/21/19 1028 10/21/19 1606 10/22/19 0330  PROCALCITON  --  4.33 3.68  LATICACIDVEN 2.0*   2.0*  --   --     Recent Results (from the past 240 hour(s))  Blood Culture (routine x 2)     Status: None (Preliminary result)   Collection Time: 10/21/19 10:30 AM   Specimen: BLOOD  Result Value Ref Range Status   Specimen Description   Final    BLOOD RIGHT ANTECUBITAL Performed at [facility], [place] [address], [place], [place] [address]    Special Requests   Final    BOTTLES DRAWN AEROBIC AND ANAEROBIC Blood Culture adequate volume Performed at [place] [address] [place], [place] [address]    Culture  Setup Time   Final    GRAM POSITIVE COCCI ANAEROBIC BOTTLE ONLY Organism ID to follow CRITICAL RESULT CALLED  TO, READ BACK BY AND VERIFIED WITH: PHARM D M.LLIASTON AT 9794 ON 10/22/2019 BY T.SAAD Performed at [facility], [place] [address], [place], [place] [address]    Culture GRAM POSITIVE COCCI  Final   Report Status PENDING  Incomplete  Blood Culture ID Panel (Reflexed)     Status: Abnormal   Collection Time: 10/21/19 10:30 AM  Result Value Ref Range Status   Enterococcus faecalis NOT DETECTED NOT DETECTED Final   Enterococcus Faecium NOT DETECTED NOT DETECTED Final   Listeria monocytogenes NOT DETECTED NOT DETECTED Final   Staphylococcus species DETECTED (A) NOT DETECTED Final    Comment: CRITICAL RESULT CALLED TO, READ BACK BY AND VERIFIED WITH: PHARM D M.LLIASTON AT 0724 ON 10/22/2019 BY T.SAAD    Staphylococcus aureus (BCID) NOT DETECTED NOT DETECTED Final   Staphylococcus epidermidis DETECTED (A) NOT DETECTED Final    Comment: Methicillin (oxacillin) resistant coagulase negative staphylococcus. Possible blood culture contaminant (unless isolated from more than one blood culture draw or clinical case suggests pathogenicity). No antibiotic treatment is indicated for blood  culture contaminants. PHARM D M.LLIASTON AT 5374 ON 10/22/2019 BY T.SAAD    Staphylococcus lugdunensis NOT DETECTED NOT DETECTED Final   Streptococcus species NOT DETECTED NOT DETECTED Final   Streptococcus agalactiae NOT DETECTED NOT DETECTED Final   Streptococcus pneumoniae NOT DETECTED NOT DETECTED Final   Streptococcus pyogenes NOT DETECTED NOT DETECTED Final   A.calcoaceticus-baumannii NOT DETECTED NOT DETECTED Final   Bacteroides fragilis NOT DETECTED NOT DETECTED Final   Enterobacterales NOT DETECTED NOT DETECTED Final   Enterobacter  cloacae complex NOT DETECTED NOT DETECTED Final   Escherichia coli NOT DETECTED NOT DETECTED Final   Klebsiella aerogenes NOT DETECTED NOT DETECTED Final   Klebsiella oxytoca NOT DETECTED NOT DETECTED Final   Klebsiella pneumoniae NOT DETECTED NOT DETECTED Final   Proteus  species NOT DETECTED NOT DETECTED Final   Salmonella species NOT DETECTED NOT DETECTED Final   Serratia marcescens NOT DETECTED NOT DETECTED Final   Haemophilus influenzae NOT DETECTED NOT DETECTED Final   Neisseria meningitidis NOT DETECTED NOT DETECTED Final   Pseudomonas aeruginosa NOT DETECTED NOT DETECTED Final   Stenotrophomonas maltophilia NOT DETECTED NOT DETECTED Final   Candida albicans NOT DETECTED NOT DETECTED Final   Candida auris NOT DETECTED NOT DETECTED Final   Candida glabrata NOT DETECTED NOT DETECTED Final   Candida krusei NOT DETECTED NOT DETECTED Final   Candida parapsilosis NOT DETECTED NOT DETECTED Final   Candida tropicalis NOT DETECTED NOT DETECTED Final   Cryptococcus neoformans/gattii NOT DETECTED NOT DETECTED Final   Methicillin resistance mecA/C DETECTED (A) NOT DETECTED Final    Comment: PHARM D M.LLIASTON AT 9767 ON 10/22/2019 BY T.SAAD Performed at [facility], [place] [address] [place], [place] [address]   Respiratory Panel by RT PCR (Flu A&B, Covid) - Nasopharyngeal Swab     Status: Abnormal   Collection Time: 10/21/19 12:51 PM   Specimen: Nasopharyngeal Swab  Result Value Ref Range Status   SARS Coronavirus 2 by RT PCR POSITIVE (A) NEGATIVE Final    Comment: RESULT CALLED TO, READ BACK BY AND VERIFIED WITH: CLAPP,S. RN _0  ON 09.29.2021 BY COHEN,K (NOTE) SARS-CoV-2 target nucleic acids are DETECTED.  SARS-CoV-2 RNA is generally detectable in upper respiratory specimens  during the acute phase of infection. Positive results are indicative of the presence of the identified virus, but do not rule out bacterial infection or co-infection with other pathogens not detected by the test. Clinical correlation with patient history and other diagnostic information is necessary to determine patient infection status. The expected result is Negative.  Fact Sheet for Patients:  PinkCheek.be  Fact Sheet for Healthcare  Providers: GravelBags.it  This test is not yet approved or cleared by the [place] FDA and  has been authorized for detection and/or diagnosis of SARS-CoV-2 by FDA under an Emergency Use Authorization (EUA).  This EUA will remain in effect (meaning this test  can be used) for the duration of  the COVID-19 declaration under Section 564(b)(1) of the Act, 21 U.S.C. section 360bbb-3(b)(1), unless the authorization is terminated or revoked sooner.      Influenza A by PCR NEGATIVE NEGATIVE Final   Influenza B by PCR NEGATIVE NEGATIVE Final    Comment: (NOTE) The Xpert Xpress SARS-CoV-2/FLU/RSV assay is intended as an aid in  the diagnosis of influenza from Nasopharyngeal swab specimens and  should not be used as a sole basis for treatment. Nasal washings and  aspirates are unacceptable for Xpert Xpress SARS-CoV-2/FLU/RSV  testing.  Fact Sheet for Patients: PinkCheek.be  Fact Sheet for Healthcare Providers: GravelBags.it  This test is not yet approved or cleared by the [place] FDA and  has been authorized for detection and/or diagnosis of SARS-CoV-2 by  FDA under an Emergency Use Authorization (EUA). This EUA will remain  in effect (meaning this test can be used) for the duration of the  Covid-19 declaration under Section 564(b)(1) of the Act, 21  U.S.C. section 360bbb-3(b)(1), unless the authorization is  terminated or revoked. Performed at [facility],  [place] [address] [place], [place] [address]          Radiology Studies: DG Chest Port 1 View  Result Date: 10/21/2019 CLINICAL DATA:  Fever, generalized weakness EXAM: PORTABLE CHEST 1 VIEW COMPARISON:  11/18/2015 FINDINGS: Right-sided chest port terminates at the level of the distal SVC. Heart size is normal. Atherosclerotic calcification of the aortic knob. Minimal linear bibasilar atelectasis. No focal airspace  consolidation, pleural effusion, or pneumothorax. Bilateral reverse shoulder arthroplasties. IMPRESSION: Minimal bibasilar atelectasis. Electronically Signed   By: Davina Poke D.O.   On: 10/21/2019 10:22        Scheduled Meds:  sodium chloride   Intravenous Once   vitamin C  500 mg Oral Daily   dronabinol  2.5 mg Oral BID AC   mirtazapine  7.5 mg Oral QHS   tamsulosin  0.4 mg Oral QHS   valACYclovir  1,000 mg Oral Daily   zinc sulfate  220 mg Oral Daily   Continuous Infusions:  ceFEPime (MAXIPIME) IV 2 g (10/21/19 2132)   remdesivir 100 mg in NS 100 mL       LOS: 1 day    Time spent:40 min    Keigen Caddell, Geraldo Docker, MD [facility] Pager [phone]  If 7PM-7AM, please contact night-coverage [email or web address] Password [facility] 10/22/2019, 7:37 AM

## 2019-10-23 ENCOUNTER — Other Ambulatory Visit: Payer: Self-pay | Admitting: Hematology & Oncology

## 2019-10-23 ENCOUNTER — Inpatient Hospital Stay (HOSPITAL_COMMUNITY)
Admission: EM | Admit: 2019-10-23 | Discharge: 2019-10-23 | Disposition: A | Discharge status: Home / Self Care | Payer: PPO | Source: Home / Self Care | Attending: Internal Medicine | Admitting: Internal Medicine

## 2019-10-23 DIAGNOSIS — Z7189 Other specified counseling: Secondary | ICD-10-CM

## 2019-10-23 DIAGNOSIS — Z515 Encounter for palliative care: Secondary | ICD-10-CM

## 2019-10-23 DIAGNOSIS — R7989 Other specified abnormal findings of blood chemistry: Secondary | ICD-10-CM

## 2019-10-23 DIAGNOSIS — R531 Weakness: Secondary | ICD-10-CM

## 2019-10-23 DIAGNOSIS — R509 Fever, unspecified: Secondary | ICD-10-CM

## 2019-10-23 DIAGNOSIS — C9000 Multiple myeloma not having achieved remission: Secondary | ICD-10-CM

## 2019-10-23 DIAGNOSIS — Z9484 Stem cells transplant status: Secondary | ICD-10-CM

## 2019-10-23 DIAGNOSIS — D61818 Other pancytopenia: Secondary | ICD-10-CM

## 2019-10-23 DIAGNOSIS — I2510 Atherosclerotic heart disease of native coronary artery without angina pectoris: Secondary | ICD-10-CM

## 2019-10-23 DIAGNOSIS — I10 Essential (primary) hypertension: Secondary | ICD-10-CM

## 2019-10-23 DIAGNOSIS — Z87891 Personal history of nicotine dependence: Secondary | ICD-10-CM

## 2019-10-23 DIAGNOSIS — U071 COVID-19: Secondary | ICD-10-CM

## 2019-10-23 LAB — COMPREHENSIVE METABOLIC PANEL
ALT: 26 U/L (ref 0–44)
AST: 67 U/L — ABNORMAL HIGH (ref 15–41)
Albumin: 2.9 g/dL — ABNORMAL LOW (ref 3.5–5.0)
Alkaline Phosphatase: 46 U/L (ref 38–126)
Anion gap: 11 (ref 5–15)
BUN: 25 mg/dL — ABNORMAL HIGH (ref 8–23)
CO2: 22 mmol/L (ref 22–32)
Calcium: 8.3 mg/dL — ABNORMAL LOW (ref 8.9–10.3)
Chloride: 107 mmol/L (ref 98–111)
Creatinine, Ser: 1.01 mg/dL (ref 0.61–1.24)
GFR calc Af Amer: >60 mL/min (ref >60)
GFR calc non Af Amer: >60 mL/min (ref >60)
Glucose, Bld: 99 mg/dL (ref 70–99)
Potassium: 3.3 mmol/L — ABNORMAL LOW (ref 3.5–5.1)
Sodium: 140 mmol/L (ref 135–145)
Total Bilirubin: 1.1 mg/dL (ref 0.3–1.2)
Total Protein: 7.1 g/dL (ref 6.5–8.1)

## 2019-10-23 LAB — CBC WITH DIFFERENTIAL/PLATELET
Abs Immature Granulocytes: 0.04 10*3/uL (ref 0.00–0.07)
Basophils Absolute: 0 10*3/uL (ref 0.0–0.1)
Basophils Relative: 0 %
Eosinophils Absolute: 0 10*3/uL (ref 0.0–0.5)
Eosinophils Relative: 1 %
HCT: 20.8 % — ABNORMAL LOW (ref 39.0–52.0)
Hemoglobin: 7.1 g/dL — ABNORMAL LOW (ref 13.0–17.0)
Immature Granulocytes: 4 %
Lymphocytes Relative: 9 %
Lymphs Abs: 0.1 10*3/uL — ABNORMAL LOW (ref 0.7–4.0)
MCH: 31.6 pg (ref 26.0–34.0)
MCHC: 34.1 g/dL (ref 30.0–36.0)
MCV: 92.4 fL (ref 80.0–100.0)
Monocytes Absolute: 0.1 10*3/uL (ref 0.1–1.0)
Monocytes Relative: 9 %
Neutro Abs: 0.7 10*3/uL — ABNORMAL LOW (ref 1.7–7.7)
Neutrophils Relative %: 77 %
Platelets: 11 10*3/uL — CL (ref 150–400)
RBC: 2.25 MIL/uL — ABNORMAL LOW (ref 4.22–5.81)
RDW: 19.9 % — ABNORMAL HIGH (ref 11.5–15.5)
WBC: 1 10*3/uL — CL (ref 4.0–10.5)
nRBC: 2.1 % — ABNORMAL HIGH (ref 0.0–0.2)

## 2019-10-23 LAB — LACTATE DEHYDROGENASE: LDH: 253 U/L — ABNORMAL HIGH (ref 98–192)

## 2019-10-23 LAB — URINE CULTURE: Culture: 3000 — AB

## 2019-10-23 LAB — C-REACTIVE PROTEIN: CRP: 30.4 mg/dL — ABNORMAL HIGH (ref <1.0)

## 2019-10-23 LAB — FERRITIN: Ferritin: 2548 ng/mL — ABNORMAL HIGH (ref 24–336)

## 2019-10-23 LAB — D-DIMER, QUANTITATIVE: D-Dimer, Quant: 1.24 ug/mL-FEU — ABNORMAL HIGH (ref 0.00–0.50)

## 2019-10-23 LAB — PHOSPHORUS: Phosphorus: 2.6 mg/dL (ref 2.5–4.6)

## 2019-10-23 LAB — MAGNESIUM: Magnesium: 2 mg/dL (ref 1.7–2.4)

## 2019-10-23 MED ORDER — MORPHINE SULFATE (PF) 2 MG/ML IV SOLN: 1.0000 mg | INTRAVENOUS | Status: DC | PRN

## 2019-10-23 MED ORDER — TBO-FILGRASTIM 480 MCG/0.8ML ~~LOC~~ SOSY
480.0000 ug | PREFILLED_SYRINGE | Freq: Once | SUBCUTANEOUS | Status: AC
  Administered 2019-10-23: 480 ug via SUBCUTANEOUS
  Filled 2019-10-23: qty 0.8

## 2019-10-23 MED ORDER — METOPROLOL TARTRATE 50 MG PO TABS: 50.0000 mg | ORAL_TABLET | Freq: Two times a day (BID) | ORAL | Status: DC

## 2019-10-23 MED ORDER — POTASSIUM CHLORIDE CRYS ER 20 MEQ PO TBCR
50.0000 meq | EXTENDED_RELEASE_TABLET | Freq: Once | ORAL | Status: AC
  Administered 2019-10-23: 50 meq via ORAL
  Filled 2019-10-23: qty 1

## 2019-10-23 MED ORDER — METHYLPREDNISOLONE SODIUM SUCC 125 MG IJ SOLR
100.0000 mg | Freq: Two times a day (BID) | INTRAMUSCULAR | Status: DC
  Administered 2019-10-23 – 2019-10-28 (×10): 100 mg via INTRAVENOUS
  Filled 2019-10-23 (×10): qty 2

## 2019-10-23 MED ORDER — CHLORHEXIDINE GLUCONATE CLOTH 2 % EX PADS
6.0000 | MEDICATED_PAD | Freq: Every day | CUTANEOUS | Status: DC
  Administered 2019-10-23 – 2019-10-28 (×6): 6 via TOPICAL

## 2019-10-23 MED ORDER — METOPROLOL TARTRATE 25 MG PO TABS
25.0000 mg | ORAL_TABLET | Freq: Two times a day (BID) | ORAL | Status: DC
  Administered 2019-10-23 – 2019-10-28 (×10): 25 mg via ORAL
  Filled 2019-10-23 (×11): qty 1

## 2019-10-23 NOTE — Progress Notes (Signed)
Pharmacy Antibiotic Note  Gene Hill is a 79 y.o. male admitted on 10/21/2019 with suspected sepsis from an unknown source.  PMH significant for multiple myeloma (undergoing chemo).  Blood cultures now positive for Staph Epi, MecA positive, in 1/4 bottles, likely contaminant.  Pharmacy consulted to dose cefepime.   10/23/2019 Afebrile Scr 1.01, CrCl ~ 58.44ms/min WBC 1.0  Plan:  Continue Cefepime 2gm IV q12h  Follow renal function  F/u culture results/sensitivities     Temp (24hrs), Avg:98.8 F (37.1 C), Min:98 F (36.7 C), Max:100 F (37.8 C)  Recent Labs  Lab 10/20/19 0950 10/21/19 1028 10/21/19 1606 10/22/19 0330 10/23/19 0251  WBC 2.8* 2.4*  --  1.2* 1.0*  CREATININE 1.13 1.24 1.17 1.10 1.01  LATICACIDVEN  --  2.0*  2.0*  --   --   --     Estimated Creatinine Clearance: 58.1 mL/min (by C-G formula based on SCr of 1.01 mg/dL).    No Known Allergies  Antimicrobials this admission: 9/29 Metronidazole x 1 9/29 Vanc >>  9/29 9/29 Cefepime >>  Dose adjustments this admission:   Microbiology results:  9/29 BCx: 1/4 bottles GPC 9/29 UCx:   9/29 Resp Panel: Influenza A/B neg, COVID positive 9/30 BCID:  Staphylococcus epidermidis, mecA+  Thank you for allowing pharmacy to be a part of this patient's care.  EDolly RiasRPh 10/23/2019, 10:34 AM

## 2019-10-23 NOTE — Consult Note (Signed)
Referral MD  Reason for Referral: Covid-19 infection; IgA kappa myeloma-refractory  Chief Complaint  Patient presents with  . Fever  : I just got very weak.  HPI: Gene Hill is well-known to me. Is a 79 year old white male. He has IgA kappa myeloma. He has had this now for probably 10 years. 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 He has had recurrent disease that we have been treating for probably 5 years or so. Unfortunately, his disease is becoming refractory.  He now is on chemotherapy with bendamustine.  He has been dealing with marked pancytopenia. This probably is from myeloma involvement of his marrow. Could also be from past treatments.  He has been receiving Melflufen. When we last checked his myeloma studies, his IgA level was 2900 mg/dL. His kappa light chain was 254 mg/dL.  He started bendamustine last week. He has had his first cycle.  On Wednesday, he began to get weak. He had no cough. He has had his coronavirus vaccines.  Got to the point where he cannot really get up because of weakness. He subsequently was sent to the emergency room. Surprisingly, he was found to be COVID-19+.  He may have had a little of a temperature. He had no diarrhea. There is no bleeding.  His chest x-ray on a mission showed minimal bibasilar atelectasis.  His labs this morning show sodium 140 potassium 3.3. BUN 25 creatinine 1.01. Calcium 8.3. Albumin 2.9. His LDH is 253. His white cell count is 1. Hemoglobin 7.1. Platelet count 11,000.  He does not have much of an appetite. He has had no vomiting. He has had no rashes. He has had a little bit of leg swelling.  He is having a lot of pain in his left shoulder. He is having some slight discomfort in the right thigh.  Overall, his performance status is ECOG 2.    Past Medical History:  Diagnosis Date  . Arthritis   . 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 Blood dyscrasia    low WBC d/t multiple myeloma  . Bulging lumbar disc     L2-3  . Carotid bruit    3-11 carotid u/s was  (-)  . 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 GERD (gastroesophageal reflux disease)   . Headache   . Heart murmur    doesn't have one  . History of hiatal hernia   . HYPERLIPIDEMIA 05/03/2006  . HYPERTENSION 05/03/2006  . 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 Torn rotator cuff 01/22/2017   left- [place]  :  Past Surgical History:  Procedure Laterality Date  . BACK SURGERY    . CERVICAL FUSION    . IR FLUORO GUIDE PORT INSERTION RIGHT  11/29/2016  . IR US GUIDE VASC ACCESS RIGHT  11/29/2016  . [place]   right  . REVERSE SHOULDER ARTHROPLASTY Right 06/25/2018   Procedure: REVERSE SHOULDER ARTHROPLASTY;  Surgeon: Hiram Gash, MD;  Location: [facility];  Service: Orthopedics;  Laterality: Right;  . REVERSE SHOULDER ARTHROPLASTY Left 11/19/2018   Procedure: REVERSE SHOULDER ARTHROPLASTY;  Surgeon: Hiram Gash, MD;  Location: [facility];  Service: Orthopedics;  Laterality: Left;  . ROTATOR CUFF REPAIR     right  . SPINAL FUSION  lumbar  . TOTAL KNEE ARTHROPLASTY Right 12/23/2015   Procedure: TOTAL KNEE ARTHROPLASTY;  Surgeon: Earlie Server, MD;  Location: [facility];  Service: Orthopedics;  Laterality: Right;  . TOTAL KNEE REVISION Right 02/18/2019  . TOTAL KNEE REVISION Right 02/18/2019   Procedure: TOTAL KNEE REVISION;  Surgeon: Earlie Server, MD;  Location: [facility];  Service: Orthopedics;  Laterality: Right;  :   Current Facility-Administered Medications:  .  acetaminophen (TYLENOL) tablet 650 mg, 650 mg, Oral, Q6H PRN, 650 mg at 10/22/19 2127 **OR** acetaminophen (TYLENOL) suppository 650 mg, 650 mg, Rectal, Q6H PRN, Pahwani, Rinka R, MD .  ascorbic acid (VITAMIN C) tablet 500 mg, 500 mg, Oral, Daily, Pahwani, Rinka R, MD, 500  mg at 10/22/19 1103 .  ceFEPIme (MAXIPIME) 2 g in sodium chloride 0.9 % 100 mL IVPB, 2 g, Intravenous, Q12H, Poindexter, Leann T, RPH, Stopped at 10/23/19 0320 .  dronabinol (MARINOL) capsule 2.5 mg, 2.5 mg, Oral, BID AC, Pahwani, Rinka R, MD .  LORazepam (ATIVAN) tablet 0.5 mg, 0.5 mg, Oral, Q6H PRN, Pahwani, Rinka R, MD .  methocarbamol (ROBAXIN) tablet 500 mg, 500 mg, Oral, TID PRN, Pahwani, Rinka R, MD, 500 mg at 10/23/19 0259 .  mirtazapine (REMERON) tablet 7.5 mg, 7.5 mg, Oral, QHS, Pahwani, Rinka R, MD, 7.5 mg at 10/22/19 2127 .  ondansetron (ZOFRAN) tablet 4 mg, 4 mg, Oral, Q6H PRN **OR** ondansetron (ZOFRAN) injection 4 mg, 4 mg, Intravenous, Q6H PRN, Pahwani, Rinka R, MD, 4 mg at 10/21/19 1722 .  [COMPLETED] remdesivir 200 mg in sodium chloride 0.9% 250 mL IVPB, 200 mg, Intravenous, Once, Stopped at 10/21/19 1633 **FOLLOWED BY** remdesivir 100 mg in sodium chloride 0.9 % 100 mL IVPB, 100 mg, Intravenous, Daily, Poindexter, Leann T, RPH, Stopped at 10/22/19 1311 .  tamsulosin (FLOMAX) capsule 0.4 mg, 0.4 mg, Oral, QHS, Pahwani, Rinka R, MD, 0.4 mg at 10/22/19 2126 .  traMADol (ULTRAM) tablet 50 mg, 50 mg, Oral, Q6H PRN, Pahwani, Rinka R, MD, 50 mg at 10/22/19 2127 .  valACYclovir (VALTREX) tablet 1,000 mg, 1,000 mg, Oral, Daily, Pahwani, Rinka R, MD, 1,000 mg at 10/22/19 1103 .  zinc sulfate capsule 220 mg, 220 mg, Oral, Daily, Pahwani, Rinka R, MD, 220 mg at 10/22/19 1103  Current Outpatient Medications:  .  acetaminophen (TYLENOL) 500 MG tablet, Take 1,000 mg by mouth 3 (three) times daily as needed (pain). , Disp: , Rfl:  .  aspirin 325 MG tablet, Take 325 mg by mouth daily., Disp: , Rfl:  .  b complex vitamins tablet, Take 1 tablet by mouth daily., Disp: , Rfl:  .  calcium carbonate (OSCAL) 1500 (600 Ca) MG TABS tablet, Take 600 mg of elemental calcium by mouth daily with breakfast. , Disp: , Rfl:  .  Cyanocobalamin (VITAMIN B-12 PO), Take 5,000 mcg by mouth daily. , Disp: , Rfl:  .   dronabinol (MARINOL) 2.5 MG capsule, TAKE 1 CAPSULE(2.5 MG) BY MOUTH TWICE DAILY BEFORE A MEAL (Patient taking differently: Take 2.5 mg by mouth 2 (two) times daily before lunch and supper. ), Disp: 60 capsule, Rfl: 0 .  famciclovir (FAMVIR) 250 MG tablet, Take 1 tablet (250 mg total) by mouth daily., Disp: 90 tablet, Rfl: 0 .  LORazepam (ATIVAN) 0.5 MG tablet, Take 0.5 mg by mouth every 6 (six) hours as needed (nausea/vomiting)., Disp: , Rfl:  .  methocarbamol (ROBAXIN) 500 MG tablet, Take 1 tablet (500 mg total) by mouth 3 (three) times daily as needed for muscle spasms., Disp:  270 tablet, Rfl: 0 .  metoprolol tartrate (LOPRESSOR) 50 MG tablet, Take 1 tablet (50 mg total) by mouth 2 (two) times daily., Disp: 180 tablet, Rfl: 3 .  mirtazapine (REMERON) 7.5 MG tablet, Take 1 tablet (7.5 mg total) by mouth at bedtime., Disp: 90 tablet, Rfl: 0 .  Multiple Vitamin (MULTIVITAMIN) tablet, Take 1 tablet by mouth daily., Disp: , Rfl:  .  Omega-3 Fatty Acids (FISH OIL) 1200 MG CAPS, Take 1,200 mg by mouth daily., Disp: , Rfl:  .  ondansetron (ZOFRAN) 8 MG tablet, Take 8 mg by mouth every 8 (eight) hours as needed for nausea or vomiting., Disp: , Rfl:  .  OVER THE COUNTER MEDICATION, Take 2.5 mg by mouth daily. Marijuana (Appetite stimulant), Disp: , Rfl:  .  pantoprazole (PROTONIX) 40 MG tablet, Take 1 tablet (40 mg total) by mouth at bedtime., Disp: 90 tablet, Rfl: 3 .  Probiotic Product (PROBIOTIC PO), Take 1 tablet by mouth daily. , Disp: , Rfl:  .  tamsulosin (FLOMAX) 0.4 MG CAPS capsule, Take 0.4 mg by mouth at bedtime. , Disp: , Rfl:  .  traMADol (ULTRAM) 50 MG tablet, Take 1 tablet (50 mg total) by mouth every 6 (six) hours as needed. 90 day supply please, Disp: 360 tablet, Rfl: 0:  . vitamin C  500 mg Oral Daily  . dronabinol  2.5 mg Oral BID AC  . mirtazapine  7.5 mg Oral QHS  . tamsulosin  0.4 mg Oral QHS  . valACYclovir  1,000 mg Oral Daily  . zinc sulfate  220 mg Oral Daily  :  No Known  Allergies:  Family History  Problem Relation Age of Onset  . Kidney cancer Father   . Leukemia Mother   . Pulmonary embolism Brother   . Stroke Maternal Grandmother   . Heart attack Maternal Grandfather   . Stroke Paternal Grandmother   . Cerebral palsy Son   . Prostate cancer Neg Hx   . Colon cancer Neg Hx   . Esophageal cancer Neg Hx   . Rectal cancer Neg Hx   . Stomach cancer Neg Hx   :  Social History   Socioeconomic History  . Marital status: Married    Spouse name: Not on file  . Number of children: 4  . Years of education: Not on file  . Highest education level: Not on file  Occupational History  . Occupation: retired  Tobacco Use  . Smoking status: Former Smoker    Packs/day: 0.25    Years: 20.00    Pack years: 5.00    Types: Cigarettes    Start date: 04/28/1954    Quit date: 11/28/1974    Years since quitting: 44.9  . Smokeless tobacco: Never Used  . Tobacco comment: quit 35 years ago, 1976  Vaping Use  . Vaping Use: Never used  Substance and Sexual Activity  . Alcohol use: Yes    Alcohol/week: 0.0 standard drinks    Comment: occ  . Drug use: No  . Sexual activity: Not Currently  Other Topics Concern  . Not on file  Social History Narrative   Household-- pt, wife, son   Social Determinants of Health   Financial Resource Strain: Low Risk   . Difficulty of Paying Living Expenses: Not hard at all  Food Insecurity: No Food Insecurity  . Worried About Charity fundraiser in the Last Year: Never true  . Ran Out of Food in the Last Year: Never true  Transportation  Needs: No Transportation Needs  . Lack of Transportation (Medical): No  . Lack of Transportation (Non-Medical): No  Physical Activity:   . Days of Exercise per Week: Not on file  . Minutes of Exercise per Session: Not on file  Stress:   . Feeling of Stress : Not on file  Social Connections:   . Frequency of Communication with Friends and Family: Not on file  . Frequency of Social  Gatherings with Friends and Family: Not on file  . Attends Religious Services: Not on file  . Active Member of Clubs or Organizations: Not on file  . Attends Archivist Meetings: Not on file  . Marital Status: Not on file  Intimate Partner Violence:   . Fear of Current or Ex-Partner: Not on file  . Emotionally Abused: Not on file  . Physically Abused: Not on file  . Sexually Abused: Not on file  :  Review of Systems  Constitutional: Positive for fever and malaise/fatigue.  HENT: Negative.   Eyes: Negative.   Respiratory: Negative.   Cardiovascular: Negative.   Gastrointestinal: Negative.   Genitourinary: Negative.   Musculoskeletal: Positive for joint pain.  Skin: Negative.   Neurological: Negative.   Endo/Heme/Allergies: Negative.   Psychiatric/Behavioral: Negative.      Exam:  This is a fairly well-developed and well-nourished white male in no obvious distress. Vital signs show temperature of 99. Pulse 100. Blood pressure 140/71. His head and neck exam shows no scleral icterus. Conjunctiva are pale. There is no oral lesions. He has no adenopathy in the neck. Lungs are clear bilaterally. He has good air movement bilaterally. Cardiac exam slightly tachycardic but regular. He has no murmurs. Abdomen is soft. He is mildly obese. There is no fluid wave. There is no guarding or rebound tenderness. There is no palpable liver or spleen tip. Back exam shows a laminectomy scar in the lumbar spine. Extremities shows some tenderness to range of motion of the left shoulder. There is no swelling of the left shoulder. He has some mild edema in the lower legs. Skin exam shows no rashes, ecchymoses or petechia. Neurological exam is nonfocal.  Patient Vitals for the past 24 hrs:  BP Temp Temp src Pulse Resp SpO2  10/23/19 0600 140/71 -- -- 98 (!) 24 96 %  10/23/19 0500 130/70 -- -- 98 (!) 26 96 %  10/23/19 0400 136/71 -- -- 96 (!) 22 95 %  10/23/19 0300 139/79 98 F (36.7 C) -- (!)  104 (!) 24 96 %  10/23/19 0200 136/72 -- -- 97 18 96 %  10/23/19 0100 131/70 -- -- 90 20 95 %  10/23/19 0000 (!) 149/86 -- -- 96 20 96 %  10/22/19 2300 136/71 -- -- 91 (!) 22 93 %  10/22/19 2200 130/67 -- -- 98 12 94 %  10/22/19 2100 (!) 144/75 -- -- (!) 105 (!) 23 96 %  10/22/19 2000 134/77 100 F (37.8 C) -- (!) 102 (!) 22 96 %  10/22/19 1900 (!) 142/75 -- -- (!) 103 20 96 %  10/22/19 1800 (!) 161/81 -- -- (!) 128 16 98 %  10/22/19 1700 140/78 -- -- (!) 107 -- 96 %  10/22/19 1600 139/69 -- -- 100 -- 95 %  10/22/19 1414 (!) 146/74 -- -- 74 20 97 %  10/22/19 1311 (!) 143/70 -- -- 100 20 100 %  10/22/19 1217 139/69 98.4 F (36.9 C) Oral 68 20 95 %  10/22/19 1100 126/67 -- -- 97 --  96 %  10/22/19 0915 122/82 99.9 F (37.7 C) Oral (!) 115 20 95 %  10/22/19 0847 140/66 (!) 100.4 F (38 C) Oral 96 18 95 %  10/22/19 0800 (!) 144/70 -- -- (!) 114 (!) 31 97 %  10/22/19 0700 132/74 -- -- (!) 113 (!) 30 95 %     Recent Labs    10/22/19 0330 10/23/19 0251  WBC 1.2* 1.0*  HGB 6.3* 7.1*  HCT 18.0* 20.8*  PLT 10* 11*   Recent Labs    10/22/19 0330 10/23/19 0251  NA 139 140  K 3.4* 3.3*  CL 106 107  CO2 20* 22  GLUCOSE 99 99  BUN 22 25*  CREATININE 1.10 1.01  CALCIUM 8.9 8.3*    Blood smear review: None  Pathology: None    Assessment and Plan: Gene Hill is a very nice 79 year old white male. He has had a long history of IgA kappa myeloma. He is getting to the point where our options are very limited. He is not a candidate for a second transplant. He is not a candidate for CAR-T therapy.  He is only had 1 cycle of the bendamustine. We will have to see if this really does help.  I feel bad that he has the Covid. I suspect that with his treatment, may be the vaccines could not have been all that effective.  His real problem is the pancytopenia. This is been a real stumbling block for [place].  I will put him on Neupogen to check his white cell count up.  I would not  transfuse him right now.  Again, I feel bad that he has the Covid-19.  What ever treatment needs to be given for the COVID-19 would be fine from my point of view.  I know that he will get fantastic care from the staff. I feel bad that has been the emergency room for over a day now.  Lattie Haw, MD  1 Corinthians 16:13-14

## 2019-10-23 NOTE — Progress Notes (Signed)
PROGRESS NOTE    Gene Hill  OZY:[id number] DOB: Dec 10, 1940 DOA: 10/21/2019 PCP: Colon Branch, MD     Brief Narrative:    79 y.o. WM PMHx Multiple myeloma, HTN, CAD, s/p cath 1998, HLD, GERD,  lower back pain, arthritis, pancytopenia   Presents to emergency department with generalized weakness and not feeling well overall.  Patient tells me that he has not been feeling well, has fever and generalized weakness started last night. He received blood transfusion yesterday. Reports that last night he was feeling extremely weak and had a fever. He received second dose of COVID-19 vaccine on Friday and thinks that his symptoms could be related to his second shot. Reports decreased appetite as well.  He denies headache, blurry vision, lightheadedness, dizziness, cough, congestion, nausea, vomiting, abdominal pain, urinary or bowel changes. Denies melena, hematemesis, hemoptysis, epistaxis, petechiae or bruises on his body.  He has a port on right upper chest however he denies redness or swelling around the port site.  No history of smoking, alcohol, illicit drug use. His wife called EMS and reported his temperature was 100.4 in route to ED.  ED Course: Upon arrival to ED: Patient had fever of 101.4, tachycardic, tachypneic, lactic acid of 2.0, WBC of 2.4, platelet: 14, potassium: 3.0, UA positive for rare bacteria, POC COVID-19 negative. Chest x-ray shows minimal bibasilar atelectasis. Patient received IV fluid, cefepime, vancomycin, Flagyl and potassium in ED. [facility] consulted for admission for sepsis secondary to unknown cause.   Subjective: Afebrile overnight although temperature elevated to 37.8 C, A/O x4, patient understands that he is not doing well on the current chemotherapy.  Negative abdominal pain, negative nausea, negative vomiting.  Negative S OB   Assessment & Plan: Covid vaccination; vaccinated second shot on Friday   Principal Problem:   Sepsis ([place]) Active  Problems:   Multiple myeloma ([place])   Hyperlipidemia   Essential hypertension   GERD (gastroesophageal reflux disease)   Pancytopenia ([place])    Sepsis: -On admission patient met criteria for sepsis HR> 90, RR> 20, CBC<4, pancytopenia - Respiratory panel positive for COVID-19.,  -Chest x-ray negative for pneumonia.  He is maintaining oxygen saturation on room air -Patient received IV fluid, cefepime, vancomycin and Flagyl in ED. -10/1 patient currently on cefepime -BC, UC, pending Results for Gene Hill, Gene Hill (MRN [id number]) as of 10/23/2019 19:59  Ref. Range 10/21/2019 16:06 10/22/2019 03:30  Procalcitonin Latest Units: ng/mL 4.33 3.68   Covid infection COVID-19 Labs  Recent Labs    10/21/19 1606 10/22/19 0330 10/23/19 0251  DDIMER 0.77* 0.70* 1.24*  FERRITIN 1,489* 1,831* 2,548*  LDH 263*  --  253*  CRP 17.6* 24.8* 30.4*    Lab Results  Component Value Date   SARSCOV2NAA POSITIVE (A) 10/21/2019   [place] NEGATIVE 02/16/2019   [place] NOT DETECTED 11/15/2018   [place] NEGATIVE 06/23/2018  -Patient fully vaccinated against Covid 19 -Remdesivir per pharmacy protocol -10/1 Solu-Medrol 100 mg BID.  Patient CRP> 30, which now qualifies him for steroids -Vitamin C and zinc per Covid protocol -Patient does not meet criteria Baricitinib on active chemotherapy -Given patient's: Multiple comorbidities Covid positive multiple myeloma resistant to chemotherapy, pancytopenia, will continue treatment for both bacterial as well as Covid infection. -10/2 discuss with ID  Elevated D-dimer -Patient with new onset right inner thigh pain.  Lump on palpation feels like a cord.  DVT? -10/1 lower extremity Doppler pending  Essential HTN -10/1 Metoprolol 25 mg  BID (home dose 50 mg)  Multiple myeloma: -Currently getting chemo.  Received blood transfusion yesterday -Followed by oncology Dr. Marin Olp oncology outpatient. -EDP consulted oncology -Per Dr. Antonieta Pert note will  start Neupogen   Pancytopenia: -In the setting of multiple myeloma -WBC 2.4, H&H: 9.4/27.2, platelet: 14 -No signs of active bleeding -See multiple myeloma.  GERD: -PPI due to severe thrombocytopenia  Depression/anxiety:  -Continue Remeron, Ativan as needed  BPH: Continue Flomax  Poor appetite:  Continue Marinol  Please note:  Patient initial troponin came back elevated-253.  Patient denies ACS symptoms.  Likely demand ischemia secondary to underlying infection.  Will trend troponin and will get stat EKG.  Hypokalemia -Potassium goal> 4 -10/1 K-Dur 50 mEq  Hypophosphatemia -Phosphorus goal> 2.5  Goals of care -10/1 Palliative Care; consult placed for patient who has multiple myeloma not responding to therapy, and Covid pneumonia.  Discuss goals of care and possible hospice.   DVT prophylaxis: SCD, no chemical anticoagulation due to severe thrombocytopenia Code Status: DNR Family Communication:  Status is: Inpatient    Dispo: The patient is from: Home              Anticipated d/c is to: Home              Anticipated d/c date is: 10/4              Patient currently unstable      Consultants:  Dr. Marin Olp oncology  Procedures/Significant Events:    I have personally reviewed and interpreted all radiology studies and my findings are as above.  VENTILATOR SETTINGS: Room air 10/1 SPO2 96%   Cultures 9/29 SARS coronavirus positive 9/29 influenza A/B negative    Antimicrobials: Anti-infectives (From admission, onward)   Start     Ordered Stop   10/22/19 1000  remdesivir 100 mg in sodium chloride 0.9 % 100 mL IVPB  Status:  Discontinued       "Followed by" Linked Group Details   10/21/19 1421 10/21/19 1426   10/22/19 1000  remdesivir 100 mg in sodium chloride 0.9 % 100 mL IVPB       "Followed by" Linked Group Details   10/21/19 1428 10/26/19 0959   10/22/19 0100  vancomycin (VANCOREADY) IVPB 750 mg/150 mL  Status:  Discontinued         10/21/19 1350 10/21/19 1422   10/21/19 1500  valACYclovir (VALTREX) tablet 1,000 mg        10/21/19 1409     10/21/19 1430  remdesivir 200 mg in sodium chloride 0.9% 250 mL IVPB  Status:  Discontinued       "Followed by" Linked Group Details   10/21/19 1421 10/21/19 1426   10/21/19 1430  remdesivir 200 mg in sodium chloride 0.9% 250 mL IVPB       "Followed by" Linked Group Details   10/21/19 1428 10/21/19 1633   10/21/19 1400  ceFEPIme (MAXIPIME) 2 g in sodium chloride 0.9 % 100 mL IVPB        10/21/19 1350     10/21/19 1345  ceFEPIme (MAXIPIME) 2 g in sodium chloride 0.9 % 100 mL IVPB  Status:  Discontinued        10/21/19 1343 10/21/19 1346   10/21/19 1345  vancomycin (VANCOCIN) IVPB 1000 mg/200 mL premix  Status:  Discontinued        10/21/19 1343 10/21/19 1346   10/21/19 1030  vancomycin (VANCOREADY) IVPB 1500 mg/300 mL  Status:  Discontinued        10/21/19 1017 10/21/19 1448  10/21/19 1015  ceFEPIme (MAXIPIME) 2 g in sodium chloride 0.9 % 100 mL IVPB        10/21/19 1000 10/21/19 1103   10/21/19 1015  metroNIDAZOLE (FLAGYL) IVPB 500 mg        10/21/19 1000 10/21/19 1247   10/21/19 1015  vancomycin (VANCOCIN) IVPB 1000 mg/200 mL premix  Status:  Discontinued        10/21/19 1000 10/21/19 1017       Devices    LINES / TUBES:      Continuous Infusions:  ceFEPime (MAXIPIME) IV Stopped (10/23/19 0320)   remdesivir 100 mg in NS 100 mL 100 mg (10/23/19 0910)     Objective: Vitals:   10/23/19 0700 10/23/19 0800 10/23/19 0911 10/23/19 1000  BP: 137/68 (!) 142/70 140/77 (!) 141/78  Pulse: (!) 111 99 (!) 104 (!) 102  Resp: 15 20 (!) 23 20  Temp:      TempSrc:      SpO2: 97% 98% 99% 96%    Intake/Output Summary (Last 24 hours) at 10/23/2019 1042 Last data filed at 10/23/2019 0407 Gross per 24 hour  Intake 1529.5 ml  Output 1800 ml  Net -270.5 ml   There were no vitals filed for this visit.  Physical Exam:  General: A/O x4, No acute respiratory distress Eyes:  negative scleral hemorrhage, negative anisocoria, negative icterus ENT: Negative Runny nose, negative gingival bleeding, Neck:  Negative scars, masses, torticollis, lymphadenopathy, JVD Lungs: Clear to auscultation bilaterally without wheezes or crackles Cardiovascular: Regular rate and rhythm without murmur gallop or rub normal S1 and S2 Abdomen: negative abdominal pain, nondistended, positive soft, bowel sounds, no rebound, no ascites, no appreciable mass Extremities: No significant cyanosis, clubbing, or edema bilateral lower extremities Skin: Negative rashes, lesions, ulcers Psychiatric:  Negative depression, negative anxiety, negative fatigue, negative mania  Central nervous system:  Cranial nerves II through XII intact, tongue/uvula midline, all extremities muscle strength 5/5, sensation intact throughout, negative dysarthria, negative expressive aphasia, negative receptive aphasia.  .     Data Reviewed: Care during the described time interval was provided by me .  I have reviewed this patient's available data, including medical history, events of note, physical examination, and all test results as part of my evaluation.  CBC: Recent Labs  Lab 10/20/19 0950 10/21/19 1028 10/22/19 0330 10/23/19 0251  WBC 2.8* 2.4* 1.2* 1.0*  NEUTROABS 2.0 1.9 0.9* 0.7*  HGB 7.2* 9.4* 6.3* 7.1*  HCT 21.4* 27.2* 18.0* 20.8*  MCV 93.9 93.5 93.8 92.4  PLT 10* 14* 10* 11*   Basic Metabolic Panel: Recent Labs  Lab 10/20/19 0950 10/21/19 1028 10/21/19 1606 10/22/19 0330 10/23/19 0251  NA 141 140 139 139 140  K 3.8 3.0* 3.2* 3.4* 3.3*  CL 103 104 106 106 107  CO2 _0 20* 22  GLUCOSE 98 123* 109* 99 99  BUN 24* 26* 25* 22 25*  CREATININE 1.13 1.24 1.17 1.10 1.01  CALCIUM 10.3 9.5 8.9 8.9 8.3*  MG  --  1.9  --  1.8 2.0  PHOS  --  3.2  --  2.2* 2.6   GFR: Estimated Creatinine Clearance: 58.1 mL/min (by C-G formula based on SCr of 1.01 mg/dL). Liver Function Tests: Recent Labs  Lab  10/20/19 0950 10/21/19 1028 10/21/19 1606 10/22/19 0330 10/23/19 0251  AST 19 42* 45* 54* 67*  ALT _1 ALKPHOS 52 50 44 40 46  BILITOT 0.7 0.8 1.1 1.3* 1.1  PROT 8.0  7.9 7.1 6.9 7.1  ALBUMIN 3.6 3.4* 3.0* 2.7* 2.9*   No results for input(s): LIPASE, AMYLASE in the last 168 hours. No results for input(s): AMMONIA in the last 168 hours. Coagulation Profile: Recent Labs  Lab 10/21/19 1028 10/22/19 0330  INR 1.3* 1.6*   Cardiac Enzymes: No results for input(s): CKTOTAL, CKMB, CKMBINDEX, TROPONINI in the last 168 hours. BNP (last 3 results) No results for input(s): PROBNP in the last 8760 hours. HbA1C: No results for input(s): HGBA1C in the last 72 hours. CBG: No results for input(s): GLUCAP in the last 168 hours. Lipid Profile: No results for input(s): CHOL, HDL, LDLCALC, TRIG, CHOLHDL, LDLDIRECT in the last 72 hours. Thyroid Function Tests: Recent Labs    10/21/19 1028  TSH 0.940   Anemia Panel: Recent Labs    10/22/19 0330 10/23/19 0251  FERRITIN 1,831* 2,548*   Sepsis Labs: Recent Labs  Lab 10/21/19 1028 10/21/19 1606 10/22/19 0330  PROCALCITON  --  4.33 3.68  LATICACIDVEN 2.0*   2.0*  --   --     Recent Results (from the past 240 hour(s))  Blood Culture (routine x 2)     Status: None (Preliminary result)   Collection Time: 10/21/19 10:20 AM   Specimen: BLOOD  Result Value Ref Range Status   Specimen Description   Final    BLOOD RIGHT ARM Performed at [facility] [address], [place], [place] [address]    Special Requests   Final    BOTTLES DRAWN AEROBIC AND ANAEROBIC Blood Culture results may not be optimal due to an inadequate volume of blood received in culture bottles Performed at [facility] [address] [place], [place] [address]    Culture   Final    NO GROWTH 2 DAYS Performed at [facility] [address], [place], [place] [address]    Report Status PENDING  Incomplete    Urine culture     Status: Abnormal   Collection Time: 10/21/19 10:28 AM   Specimen: In/Out Cath Urine  Result Value Ref Range Status   Specimen Description   Final    IN/OUT CATH URINE Performed at [facility] [address], [place], [place] [address]    Special Requests   Final    NONE Performed at [facility], [place] [address], [place], [place] [address]    Culture 3,000 COLONIES/mL STAPHYLOCOCCUS HAEMOLYTICUS (A)  Final   Report Status 10/23/2019 FINAL  Final   Organism ID, Bacteria STAPHYLOCOCCUS HAEMOLYTICUS (A)  Final      Susceptibility   Staphylococcus haemolyticus - MIC*    CIPROFLOXACIN >=8 RESISTANT Resistant     GENTAMICIN <=0.5 SENSITIVE Sensitive     NITROFURANTOIN 32 SENSITIVE Sensitive     OXACILLIN >=4 RESISTANT Resistant     TETRACYCLINE <=1 SENSITIVE Sensitive     VANCOMYCIN <=0.5 SENSITIVE Sensitive     TRIMETH/SULFA <=10 SENSITIVE Sensitive     CLINDAMYCIN RESISTANT Resistant     RIFAMPIN <=0.5 SENSITIVE Sensitive     Inducible Clindamycin POSITIVE Resistant     * 3,000 COLONIES/mL STAPHYLOCOCCUS HAEMOLYTICUS  Blood Culture (routine x 2)     Status: Abnormal (Preliminary result)   Collection Time: 10/21/19 10:30 AM   Specimen: BLOOD  Result Value Ref Range Status   Specimen Description   Final    BLOOD RIGHT ANTECUBITAL Performed at [facility], [place] [address] [place], [place] [address]    Special  Requests   Final    BOTTLES DRAWN AEROBIC AND ANAEROBIC Blood Culture adequate volume Performed at [facility] [address], [place], [place] [address]    Culture  Setup Time   Final    GRAM POSITIVE COCCI IN BOTH AEROBIC AND ANAEROBIC BOTTLES CRITICAL RESULT CALLED TO, READ BACK BY AND VERIFIED WITH: PHARM D M.LLIASTON AT 0762 ON 10/22/2019 BY T.SAAD    Culture (A)  Final    STAPHYLOCOCCUS EPIDERMIDIS THE SIGNIFICANCE OF ISOLATING THIS ORGANISM FROM A SINGLE SET OF BLOOD  CULTURES WHEN MULTIPLE SETS ARE DRAWN IS UNCERTAIN. PLEASE NOTIFY THE MICROBIOLOGY DEPARTMENT WITHIN ONE WEEK IF SPECIATION AND SENSITIVITIES ARE REQUIRED. Performed at [facility], [place] [address], [place], [place] [address]    Report Status PENDING  Incomplete  Blood Culture ID Panel (Reflexed)     Status: Abnormal   Collection Time: 10/21/19 10:30 AM  Result Value Ref Range Status   Enterococcus faecalis NOT DETECTED NOT DETECTED Final   Enterococcus Faecium NOT DETECTED NOT DETECTED Final   Listeria monocytogenes NOT DETECTED NOT DETECTED Final   Staphylococcus species DETECTED (A) NOT DETECTED Final    Comment: CRITICAL RESULT CALLED TO, READ BACK BY AND VERIFIED WITH: PHARM D M.LLIASTON AT 0724 ON 10/22/2019 BY T.SAAD    Staphylococcus aureus (BCID) NOT DETECTED NOT DETECTED Final   Staphylococcus epidermidis DETECTED (A) NOT DETECTED Final    Comment: Methicillin (oxacillin) resistant coagulase negative staphylococcus. Possible blood culture contaminant (unless isolated from more than one blood culture draw or clinical case suggests pathogenicity). No antibiotic treatment is indicated for blood  culture contaminants. PHARM D M.LLIASTON AT 5456 ON 10/22/2019 BY T.SAAD    Staphylococcus lugdunensis NOT DETECTED NOT DETECTED Final   Streptococcus species NOT DETECTED NOT DETECTED Final   Streptococcus agalactiae NOT DETECTED NOT DETECTED Final   Streptococcus pneumoniae NOT DETECTED NOT DETECTED Final   Streptococcus pyogenes NOT DETECTED NOT DETECTED Final   A.calcoaceticus-baumannii NOT DETECTED NOT DETECTED Final   Bacteroides fragilis NOT DETECTED NOT DETECTED Final   Enterobacterales NOT DETECTED NOT DETECTED Final   Enterobacter cloacae complex NOT DETECTED NOT DETECTED Final   Escherichia coli NOT DETECTED NOT DETECTED Final   Klebsiella aerogenes NOT DETECTED NOT DETECTED Final   Klebsiella oxytoca NOT DETECTED NOT DETECTED Final   Klebsiella pneumoniae NOT DETECTED  NOT DETECTED Final   Proteus species NOT DETECTED NOT DETECTED Final   Salmonella species NOT DETECTED NOT DETECTED Final   Serratia marcescens NOT DETECTED NOT DETECTED Final   Haemophilus influenzae NOT DETECTED NOT DETECTED Final   Neisseria meningitidis NOT DETECTED NOT DETECTED Final   Pseudomonas aeruginosa NOT DETECTED NOT DETECTED Final   Stenotrophomonas maltophilia NOT DETECTED NOT DETECTED Final   Candida albicans NOT DETECTED NOT DETECTED Final   Candida auris NOT DETECTED NOT DETECTED Final   Candida glabrata NOT DETECTED NOT DETECTED Final   Candida krusei NOT DETECTED NOT DETECTED Final   Candida parapsilosis NOT DETECTED NOT DETECTED Final   Candida tropicalis NOT DETECTED NOT DETECTED Final   Cryptococcus neoformans/gattii NOT DETECTED NOT DETECTED Final   Methicillin resistance mecA/C DETECTED (A) NOT DETECTED Final    Comment: PHARM D M.LLIASTON AT 2563 ON 10/22/2019 BY T.SAAD Performed at [facility], [place] [address] [place] [address], [place], [place] [address]   Respiratory Panel by RT PCR (Flu A&B, Covid) - Nasopharyngeal Swab     Status: Abnormal   Collection Time: 10/21/19 12:51 PM   Specimen: Nasopharyngeal Swab  Result Value Ref Range Status   SARS Coronavirus 2 by RT PCR POSITIVE (A) NEGATIVE Final    Comment: RESULT CALLED TO, READ BACK BY AND VERIFIED WITH: CLAPP,S. RN _0  ON 09.29.2021 BY COHEN,K (NOTE) SARS-CoV-2 target nucleic acids are DETECTED.  SARS-CoV-2 RNA is generally detectable in upper respiratory specimens  during the acute phase of infection. Positive results are indicative of the presence of the identified virus, but do not rule out bacterial infection or co-infection with other pathogens not detected by the test. Clinical correlation with patient history and other diagnostic information is necessary to determine patient infection status. The expected result is Negative.  Fact Sheet for Patients:   PinkCheek.be  Fact Sheet for Healthcare Providers: GravelBags.it  This test is not yet approved or cleared by the [place] FDA and  has been authorized for detection and/or diagnosis of SARS-CoV-2 by FDA under an Emergency Use Authorization (EUA).  This EUA will remain in effect (meaning this test  can be used) for the duration of  the COVID-19 declaration under Section 564(b)(1) of the Act, 21 U.S.C. section 360bbb-3(b)(1), unless the authorization is terminated or revoked sooner.      Influenza A by PCR NEGATIVE NEGATIVE Final   Influenza B by PCR NEGATIVE NEGATIVE Final    Comment: (NOTE) The Xpert Xpress SARS-CoV-2/FLU/RSV assay is intended as an aid in  the diagnosis of influenza from Nasopharyngeal swab specimens and  should not be used as a sole basis for treatment. Nasal washings and  aspirates are unacceptable for Xpert Xpress SARS-CoV-2/FLU/RSV  testing.  Fact Sheet for Patients: PinkCheek.be  Fact Sheet for Healthcare Providers: GravelBags.it  This test is not yet approved or cleared by the [place] FDA and  has been authorized for detection and/or diagnosis of SARS-CoV-2 by  FDA under an Emergency Use Authorization (EUA). This EUA will remain  in effect (meaning this test can be used) for the duration of the  Covid-19 declaration under Section 564(b)(1) of the Act, 21  U.S.C. section 360bbb-3(b)(1), unless the authorization is  terminated or revoked. Performed at [facility], [place] [address], [place], [place] [address]          Radiology Studies: No results found.      Scheduled Meds:  vitamin C  500 mg Oral Daily   dronabinol  2.5 mg Oral BID AC   mirtazapine  7.5 mg Oral QHS   tamsulosin  0.4 mg Oral QHS   valACYclovir  1,000 mg Oral Daily   zinc sulfate  220 mg Oral Daily   Continuous  Infusions:  ceFEPime (MAXIPIME) IV Stopped (10/23/19 0320)   remdesivir 100 mg in NS 100 mL 100 mg (10/23/19 0910)     LOS: 2 days    Time spent:40 min    Nollie Shiflett, Geraldo Docker, MD [facility] Pager [phone]  If 7PM-7AM, please contact night-coverage [email or web address] Password [facility] 10/23/2019, 10:42 AM

## 2019-10-23 NOTE — Plan of Care (Signed)
Pt admitted to the unit with no s/s of sepsis. Pt admitted to the unit from the ED, report given at 1558 and pt arrival time 47. Skin has rash all over back, buttocks and sacrum red but intact a form dressing was applied, black mold or spot on right and left feet, scrap on left knee cap and a bruise on his left medial thigh. Pt is Alert and oriented times 3. Room air and has a chest implanted port used for chemo. Telemetry in place. Stable vital signs. Problem: Education: Goal: Knowledge of risk factors and measures for prevention of condition will improve Outcome: Progressing   Problem: Coping: Goal: Psychosocial and spiritual needs will be supported Outcome: Progressing   Problem: Respiratory: Goal: Will maintain a patent airway Outcome: Progressing Goal: Complications related to the disease process, condition or treatment will be avoided or minimized Outcome: Progressing

## 2019-10-23 NOTE — ED Notes (Signed)
Report given.

## 2019-10-23 NOTE — Progress Notes (Signed)
Bilateral lower extremity venous duplex has been completed. Preliminary results can be found in CV Proc through chart review.   10/23/19 12:42 PM Carlos Levering RVT

## 2019-10-23 NOTE — Consult Note (Signed)
Consultation Note Date: 10/23/2019   Patient Name: Gene Hill  DOB: 19-Feb-1940  MRN: [id number]  Age / Sex: 79 y.o., male  PCP: Colon Branch, MD Referring Physician: Allie Bossier, MD  Reason for Consultation: Establishing goals of care  HPI/Patient Profile: 79 y.o. male admitted on 10/21/2019    Clinical Assessment and Goals of Care: 79 year old gentleman who is extremely pleasant and cooperative, currently resting in bed in his stretcher in the emergency department.  He has a past medical history of myeloma and is under the care of Dr. Marin Olp, currently admitted to hospital medicine service for COVID-19 infection.  Palliative service for goals of care discussions has been requested. Patient is awake alert resting in bed.  He states that he is actually feeling better than when he came in.  He denies shortness of breath.  He is not on supplemental oxygen.  He does complain of some discomfort ongoing in the left shoulder area.  He states that he now begins to feel he has an appetite coming back and was able to tolerate food earlier today.  He denies any chest discomfort. I introduced myself and palliative care as follows: Palliative medicine is specialized medical care for people living with serious illness. It focuses on providing relief from the symptoms and stress of a serious illness. The goal is to improve quality of life for both the patient and the family.  Goals of care: Broad aims of medical therapy in relation to the patient's values and preferences. Our aim is to provide medical care aimed at enabling patients to achieve the goals that matter most to them, given the circumstances of their particular medical situation and their constraints.   See additional discussions below.  NEXT OF KIN Lives at home in [place], [place] with wife and son.  SUMMARY OF RECOMMENDATIONS   Agree with DO  NOT RESUSCITATE Continue current mode of care.  Continue current pain and nonpain symptom management.  Goals of care discussions: Patient wishes to receive any and all life maintaining/life-prolonging therapies available.  Continue with current treatment of COVID-19 infection.  He wishes to correspond with Dr. Marin Olp for his oncologic care and next steps.  Offered active listening and supportive care.  Goals are not for comfort measures only at this time.  Patient is hopeful for ongoing meaningful recovery.  For now, palliative medicine services will follow hospital course and overall disease trajectory peripherally and will reengage if the patient has precipitous decline.  Thank you for the consult. Code Status/Advance Care Planning:  DNR    Symptom Management:      Palliative Prophylaxis:   Delirium Protocol  Additional Recommendations (Limitations, Scope, Preferences):  Full Scope Treatment  Psycho-social/Spiritual:   Desire for further Chaplaincy support:yes  Additional Recommendations: Caregiving  Support/Resources  Prognosis:   Unable to determine  Discharge Planning: To Be Determined      Primary Diagnoses: Present on Admission: . Hyperlipidemia . Essential hypertension . GERD (gastroesophageal reflux disease) . Multiple myeloma ([place]) . Sepsis ([place]) .  Pancytopenia ([place])   I have reviewed the medical record, interviewed the patient and family, and examined the patient. The following aspects are pertinent.  Past Medical History:  Diagnosis Date  . Arthritis   . 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 Blood dyscrasia    low WBC d/t multiple myeloma  . Bulging lumbar disc    L2-3  . Carotid bruit    3-11 carotid u/s was  (-)  . 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 GERD (gastroesophageal reflux disease)   . Headache   . Heart murmur    doesn't have one  . History of hiatal hernia   .  HYPERLIPIDEMIA 05/03/2006  . HYPERTENSION 05/03/2006  . 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 Torn rotator cuff 01/22/2017   left- [place]   Social History   Socioeconomic History  . Marital status: Married    Spouse name: Not on file  . Number of children: 4  . Years of education: Not on file  . Highest education level: Not on file  Occupational History  . Occupation: retired  Tobacco Use  . Smoking status: Former Smoker    Packs/day: 0.25    Years: 20.00    Pack years: 5.00    Types: Cigarettes    Start date: 04/28/1954    Quit date: 11/28/1974    Years since quitting: 44.9  . Smokeless tobacco: Never Used  . Tobacco comment: quit 35 years ago, 1976  Vaping Use  . Vaping Use: Never used  Substance and Sexual Activity  . Alcohol use: Yes    Alcohol/week: 0.0 standard drinks    Comment: occ  . Drug use: No  . Sexual activity: Not Currently  Other Topics Concern  . Not on file  Social History Narrative   Household-- pt, wife, son   Social Determinants of Health   Financial Resource Strain: Low Risk   . Difficulty of Paying Living Expenses: Not hard at all  Food Insecurity: No Food Insecurity  . Worried About Charity fundraiser in the Last Year: Never true  . Ran Out of Food in the Last Year: Never true  Transportation Needs: No Transportation Needs  . Lack of Transportation (Medical): No  . Lack of Transportation (Non-Medical): No  Physical Activity:   . Days of Exercise per Week: Not on file  . Minutes of Exercise per Session: Not on file  Stress:   . Feeling of Stress : Not on file  Social Connections:   . Frequency of Communication with Friends and Family: Not on file  . Frequency of Social Gatherings with Friends and Family: Not on file  . Attends Religious Services: Not on file  . Active Member of Clubs or Organizations: Not on  file  . Attends Archivist Meetings: Not on file  . Marital Status: Not on file   Family History  Problem Relation Age of Onset  . Kidney cancer Father   . Leukemia Mother   . Pulmonary embolism Brother   . Stroke Maternal Grandmother   . Heart attack Maternal Grandfather   . Stroke Paternal Grandmother   . Cerebral palsy Son   . Prostate cancer Neg Hx   . Colon cancer Neg Hx   . Esophageal cancer Neg Hx   .  Rectal cancer Neg Hx   . Stomach cancer Neg Hx    Scheduled Meds: . vitamin C  500 mg Oral Daily  . dronabinol  2.5 mg Oral BID AC  . mirtazapine  7.5 mg Oral QHS  . tamsulosin  0.4 mg Oral QHS  . valACYclovir  1,000 mg Oral Daily  . zinc sulfate  220 mg Oral Daily   Continuous Infusions: . ceFEPime (MAXIPIME) IV Stopped (10/23/19 1413)  . remdesivir 100 mg in NS 100 mL Stopped (10/23/19 1120)   PRN Meds:.acetaminophen **OR** acetaminophen, LORazepam, methocarbamol, morphine injection, ondansetron **OR** ondansetron (ZOFRAN) IV, traMADol Medications Prior to Admission:  Prior to Admission medications   Medication Sig Start Date End Date Taking? Authorizing Provider  acetaminophen (TYLENOL) 500 MG tablet Take 1,000 mg by mouth 3 (three) times daily as needed (pain).    Yes [provider]  aspirin 325 MG tablet Take 325 mg by mouth daily.   Yes [provider]  b complex vitamins tablet Take 1 tablet by mouth daily.   Yes [provider]  calcium carbonate (OSCAL) 1500 (600 Ca) MG TABS tablet Take 600 mg of elemental calcium by mouth daily with breakfast.    Yes [provider]  Cyanocobalamin (VITAMIN B-12 PO) Take 5,000 mcg by mouth daily.    Yes [provider]  dronabinol (MARINOL) 2.5 MG capsule TAKE 1 CAPSULE(2.5 MG) BY MOUTH TWICE DAILY BEFORE A MEAL Patient taking differently: Take 2.5 mg by mouth 2 (two) times daily before lunch and supper.  09/14/19  Yes [name], MD  famciclovir (FAMVIR) 250 MG  tablet Take 1 tablet (250 mg total) by mouth daily. 09/14/19  Yes Ennever, Rudell Cobb, MD  LORazepam (ATIVAN) 0.5 MG tablet Take 0.5 mg by mouth every 6 (six) hours as needed (nausea/vomiting).   Yes [provider]  methocarbamol (ROBAXIN) 500 MG tablet Take 1 tablet (500 mg total) by mouth 3 (three) times daily as needed for muscle spasms. 09/14/19  Yes Paz, Alda Berthold, MD  metoprolol tartrate (LOPRESSOR) 50 MG tablet Take 1 tablet (50 mg total) by mouth 2 (two) times daily. 03/23/19  Yes Paz, Alda Berthold, MD  mirtazapine (REMERON) 7.5 MG tablet Take 1 tablet (7.5 mg total) by mouth at bedtime. 09/14/19  Yes [name], MD  Multiple Vitamin (MULTIVITAMIN) tablet Take 1 tablet by mouth daily.   Yes [provider]  Omega-3 Fatty Acids (FISH OIL) 1200 MG CAPS Take 1,200 mg by mouth daily.   Yes [provider]  ondansetron (ZOFRAN) 8 MG tablet Take 8 mg by mouth every 8 (eight) hours as needed for nausea or vomiting.   Yes [provider]  OVER THE COUNTER MEDICATION Take 2.5 mg by mouth daily. Marijuana (Appetite stimulant)   Yes [provider]  pantoprazole (PROTONIX) 40 MG tablet Take 1 tablet (40 mg total) by mouth at bedtime. 06/17/19  Yes Colon Branch, MD  Probiotic Product (PROBIOTIC PO) Take 1 tablet by mouth daily.    Yes [provider]  tamsulosin (FLOMAX) 0.4 MG CAPS capsule Take 0.4 mg by mouth at bedtime.    Yes [provider]  traMADol (ULTRAM) 50 MG tablet Take 1 tablet (50 mg total) by mouth every 6 (six) hours as needed. 90 day supply please 09/14/19  Yes Ennever, Rudell Cobb, MD  prochlorperazine (COMPAZINE) 10 MG tablet Take 1 tablet (10 mg total) by mouth every 6 (six) hours as needed (Nausea or vomiting). Patient not  taking: Reported on 09/29/2019 08/24/19 10/07/19  [name], MD   No Known Allergies Review of Systems Shoulder pain Physical Exam Awake alert oriented Well-developed well-nourished No distress Tenderness left  shoulder Regular work of breathing S1-S2 Lungs clear Abdomen mildly distended but not tender Does not have edema Nonfocal  Vital Signs: BP (!) 158/87   Pulse (!) 111   Temp 98 F (36.7 C)   Resp 16   SpO2 98%  Pain Scale: 0-10   Pain Score: 8    SpO2: SpO2: 98 % O2 Device:SpO2: 98 % O2 Flow Rate: .   IO: Intake/output summary:   Intake/Output Summary (Last 24 hours) at 10/23/2019 1422 Last data filed at 10/23/2019 0407 Gross per 24 hour  Intake 1529.5 ml  Output 1800 ml  Net -270.5 ml    LBM:   Baseline Weight:   Most recent weight:       Palliative Assessment/Data:   PPS 50%  Time In:  1300 Time Out:  1400 Time Total:  60  Greater than 50%  of this time was spent counseling and coordinating care related to the above assessment and plan.  Signed by: Loistine Chance, MD   Please contact Palliative Medicine Team phone at [phone] for questions and concerns.  For individual provider: See Shea Evans

## 2019-10-24 ENCOUNTER — Inpatient Hospital Stay (HOSPITAL_COMMUNITY): Payer: PPO

## 2019-10-24 DIAGNOSIS — I340 Nonrheumatic mitral (valve) insufficiency: Secondary | ICD-10-CM

## 2019-10-24 DIAGNOSIS — A419 Sepsis, unspecified organism: Secondary | ICD-10-CM

## 2019-10-24 DIAGNOSIS — B9689 Other specified bacterial agents as the cause of diseases classified elsewhere: Secondary | ICD-10-CM

## 2019-10-24 DIAGNOSIS — R7881 Bacteremia: Secondary | ICD-10-CM

## 2019-10-24 DIAGNOSIS — N390 Urinary tract infection, site not specified: Secondary | ICD-10-CM

## 2019-10-24 DIAGNOSIS — B957 Other staphylococcus as the cause of diseases classified elsewhere: Secondary | ICD-10-CM | POA: Diagnosis present

## 2019-10-24 LAB — CULTURE, BLOOD (ROUTINE X 2): Special Requests: ADEQUATE

## 2019-10-24 LAB — ECHOCARDIOGRAM LIMITED
AR max vel: 2.73 cm2
AV Area VTI: 2.95 cm2
AV Area mean vel: 2.85 cm2
AV Mean grad: 6.4 mmHg
AV Peak grad: 12.3 mmHg
Ao pk vel: 1.75 m/s
Area-P 1/2: 4.8 cm2
Calc EF: 65.6 %
Height: 65 in
MV M vel: 5.16 m/s
MV Peak grad: 106.5 mmHg
Single Plane A2C EF: 65.1 %
Single Plane A4C EF: 66 %
Weight: 3104 oz

## 2019-10-24 LAB — COMPREHENSIVE METABOLIC PANEL
ALT: 32 U/L (ref 0–44)
AST: 58 U/L — ABNORMAL HIGH (ref 15–41)
Albumin: 2.5 g/dL — ABNORMAL LOW (ref 3.5–5.0)
Alkaline Phosphatase: 49 U/L (ref 38–126)
Anion gap: 11 (ref 5–15)
BUN: 28 mg/dL — ABNORMAL HIGH (ref 8–23)
CO2: 22 mmol/L (ref 22–32)
Calcium: 8.5 mg/dL — ABNORMAL LOW (ref 8.9–10.3)
Chloride: 107 mmol/L (ref 98–111)
Creatinine, Ser: 0.87 mg/dL (ref 0.61–1.24)
GFR calc Af Amer: >60 mL/min (ref >60)
GFR calc non Af Amer: >60 mL/min (ref >60)
Glucose, Bld: 177 mg/dL — ABNORMAL HIGH (ref 70–99)
Potassium: 4 mmol/L (ref 3.5–5.1)
Sodium: 140 mmol/L (ref 135–145)
Total Bilirubin: 0.7 mg/dL (ref 0.3–1.2)
Total Protein: 7.2 g/dL (ref 6.5–8.1)

## 2019-10-24 LAB — D-DIMER, QUANTITATIVE: D-Dimer, Quant: 1.99 ug/mL-FEU — ABNORMAL HIGH (ref 0.00–0.50)

## 2019-10-24 LAB — CBC WITH DIFFERENTIAL/PLATELET
Abs Immature Granulocytes: 0.41 10*3/uL — ABNORMAL HIGH (ref 0.00–0.07)
Basophils Absolute: 0 10*3/uL (ref 0.0–0.1)
Basophils Relative: 1 %
Eosinophils Absolute: 0 10*3/uL (ref 0.0–0.5)
Eosinophils Relative: 0 %
HCT: 22.2 % — ABNORMAL LOW (ref 39.0–52.0)
Hemoglobin: 7.2 g/dL — ABNORMAL LOW (ref 13.0–17.0)
Immature Granulocytes: 34 %
Lymphocytes Relative: 5 %
Lymphs Abs: 0.1 10*3/uL — ABNORMAL LOW (ref 0.7–4.0)
MCH: 31.6 pg (ref 26.0–34.0)
MCHC: 32.4 g/dL (ref 30.0–36.0)
MCV: 97.4 fL (ref 80.0–100.0)
Monocytes Absolute: 0.1 10*3/uL (ref 0.1–1.0)
Monocytes Relative: 7 %
Neutro Abs: 0.7 10*3/uL — ABNORMAL LOW (ref 1.7–7.7)
Neutrophils Relative %: 53 %
Platelets: 9 10*3/uL — CL (ref 150–400)
RBC: 2.28 MIL/uL — ABNORMAL LOW (ref 4.22–5.81)
RDW: 19.9 % — ABNORMAL HIGH (ref 11.5–15.5)
WBC: 1.2 10*3/uL — CL (ref 4.0–10.5)
nRBC: 0 % (ref 0.0–0.2)

## 2019-10-24 LAB — MAGNESIUM: Magnesium: 2.3 mg/dL (ref 1.7–2.4)

## 2019-10-24 LAB — FERRITIN: Ferritin: 2818 ng/mL — ABNORMAL HIGH (ref 24–336)

## 2019-10-24 LAB — C-REACTIVE PROTEIN: CRP: 28.4 mg/dL — ABNORMAL HIGH (ref <1.0)

## 2019-10-24 LAB — PHOSPHORUS: Phosphorus: 2.4 mg/dL — ABNORMAL LOW (ref 2.5–4.6)

## 2019-10-24 LAB — LACTATE DEHYDROGENASE: LDH: 263 U/L — ABNORMAL HIGH (ref 98–192)

## 2019-10-24 MED ORDER — VANCOMYCIN HCL 1250 MG/250ML IV SOLN
1250.0000 mg | INTRAVENOUS | Status: DC
  Administered 2019-10-24 – 2019-10-28 (×5): 1250 mg via INTRAVENOUS
  Filled 2019-10-24 (×7): qty 250

## 2019-10-24 MED ORDER — SODIUM CHLORIDE 0.9% IV SOLUTION: Freq: Once | INTRAVENOUS | Status: AC

## 2019-10-24 MED ORDER — PROSOURCE PLUS PO LIQD
30.0000 mL | Freq: Two times a day (BID) | ORAL | Status: DC
  Administered 2019-10-24 – 2019-10-28 (×8): 30 mL via ORAL
  Filled 2019-10-24 (×7): qty 30

## 2019-10-24 MED ORDER — ADULT MULTIVITAMIN W/MINERALS CH
1.0000 | ORAL_TABLET | Freq: Every day | ORAL | Status: DC
  Administered 2019-10-24 – 2019-10-28 (×5): 1 via ORAL
  Filled 2019-10-24 (×4): qty 1

## 2019-10-24 MED ORDER — SODIUM CHLORIDE 0.9% FLUSH: 10.0000 mL | INTRAVENOUS | Status: DC | PRN

## 2019-10-24 MED ORDER — SODIUM CHLORIDE 0.9% FLUSH
10.0000 mL | Freq: Two times a day (BID) | INTRAVENOUS | Status: DC
  Administered 2019-10-24 – 2019-10-28 (×5): 10 mL

## 2019-10-24 NOTE — Progress Notes (Signed)
  Echocardiogram 2D Echocardiogram has been performed.  Michiel Cowboy 10/24/2019, 3:29 PM

## 2019-10-24 NOTE — Progress Notes (Signed)
PROGRESS NOTE    Gene Hill  HBZ:[id number] DOB: [birth year] DOA: 10/21/2019 PCP: Colon Branch, MD     Brief Narrative:    79 y.o. WM PMHx Multiple myeloma, HTN, CAD, s/p cath 1998, HLD, GERD,  lower back pain, arthritis, pancytopenia   Presents to emergency department with generalized weakness and not feeling well overall.  Patient tells me that he has not been feeling well, has fever and generalized weakness started last night. He received blood transfusion yesterday. Reports that last night he was feeling extremely weak and had a fever. He received second dose of COVID-19 vaccine on Friday and thinks that his symptoms could be related to his second shot. Reports decreased appetite as well.  He denies headache, blurry vision, lightheadedness, dizziness, cough, congestion, nausea, vomiting, abdominal pain, urinary or bowel changes. Denies melena, hematemesis, hemoptysis, epistaxis, petechiae or bruises on his body.  He has a port on right upper chest however he denies redness or swelling around the port site.  No history of smoking, alcohol, illicit drug use. His wife called EMS and reported his temperature was 100.4 in route to ED.  ED Course: Upon arrival to ED: Patient had fever of 101.4, tachycardic, tachypneic, lactic acid of 2.0, WBC of 2.4, platelet: 14, potassium: 3.0, UA positive for rare bacteria, POC COVID-19 negative. Chest x-ray shows minimal bibasilar atelectasis. Patient received IV fluid, cefepime, vancomycin, Flagyl and potassium in ED. [facility] hospitalist consulted for admission for sepsis secondary to unknown cause.   Subjective: 10/2 all overnight   afebrile overnight although temperature elevated to 37.8 C, A/O x4, patient understands that he is not doing well on the current chemotherapy.  Negative abdominal pain, negative nausea, negative vomiting.  Negative S OB   Assessment & Plan: Covid vaccination; vaccinated second shot on Friday   Principal Problem:    Sepsis ([place]) Active Problems:   Multiple myeloma ([place])   Hyperlipidemia   Essential hypertension   GERD (gastroesophageal reflux disease)   Pancytopenia ([place])   Sepsis secondary to UTI ([place])   Coag negative Staphylococcus bacteremia    Sepsis UTI/positive staph hemolyticus (only 3K colonies but patient immunocompromised)  -On admission patient met criteria for sepsis HR> 90, RR> 20, CBC<4, pancytopenia - Respiratory panel positive for COVID-19.,  -Chest x-ray negative for pneumonia.  He is maintaining oxygen saturation on room air -Patient received IV fluid, cefepime, vancomycin and Flagyl in ED. -10/1 patient currently on cefepime -BC, UC, positive see results below Results for JEREMAIH, KLIMA (MRN [id number]) as of 10/23/2019 19:59  Ref. Range 10/21/2019 16:06 10/22/2019 03:30  Procalcitonin Latest Units: ng/mL 4.33 3.68  -Although procalcitonin and other inflammatory markers can be elevated secondary to Covid believe most likely patient has true bacterial infection secondary to his compromised immune system.  Positive Staph Epidermidis Bacteremia (1 bottle but patient immunocompromised) -Very concerned for patient given that he has compromised immune system, afraid this is a true infection. -Discussed case with Dr. Carlyle Basques, ID who will weigh in on case given patient's multiple medical problems. -Concurs with vancomycin for now -10/2 repeat blood cultures, if positive will need to remove Port-A-Cath    Covid infection COVID-19 Labs  Recent Labs    10/22/19 0330 10/23/19 0251 10/24/19 0414  DDIMER 0.70* 1.24* 1.99*  FERRITIN 1,831* 2,548* 2,818*  LDH  --  253* 263*  CRP 24.8* 30.4* 28.4*    Lab Results  Component Value Date   SARSCOV2NAA POSITIVE (A) 10/21/2019   [place] NEGATIVE 02/16/2019  [place] NOT DETECTED 11/15/2018   [place] NEGATIVE 06/23/2018  -Patient fully vaccinated against Covid 19 -Remdesivir per pharmacy protocol -10/1  Solu-Medrol 100 mg BID.  Patient CRP> 30, which now qualifies him for steroids -Vitamin C and zinc per Covid protocol -Patient does not meet criteria Baricitinib on active chemotherapy -Given patient's: Multiple comorbidities Covid positive multiple myeloma resistant to chemotherapy, pancytopenia, will continue treatment for both bacterial as well as Covid infection. -10/2 discussed with ID Dr Carlyle Basques who concurred with current treatment plan  -Explained to patient that lack of symptoms most likely secondary to the lack of his immune system.  COVID-19 does the majority of his damage by causing an immune cascade, but since his immune system is more or less nonexistent secondary to ongoing chemotherapy cannot mount an an effective immune response.  Counseled believe this is why he is not experienced any serious pulmonary, GI etc. Covid related symptoms.  Elevated D-dimer -Patient with new onset right inner thigh pain.  Lump on palpation feels like a cord.  DVT? -10/1 lower extremity Doppler negative for DVT  Essential HTN -10/1 Metoprolol 25 mg  BID (home dose 50 mg)   Multiple myeloma: -Currently getting chemo.  Received blood transfusion yesterday -Followed by oncology Dr. Marin Olp oncology outpatient. -EDP consulted oncology -Per Dr. Antonieta Pert note will start Neupogen   Pancytopenia: -In the setting of multiple myeloma -WBC 2.4, H&H: 9.4/27.2, platelet: 14 -No signs of active bleeding -See multiple myeloma.  GERD: -PPI due to severe thrombocytopenia  Depression/anxiety:  -Continue Remeron, Ativan as needed  BPH: -Continue Flomax  Poor appetite:  -Marinol 2.5 mg BID  Please note:  Patient initial troponin came back elevated-253.  Patient denies ACS symptoms.  Likely demand ischemia secondary to underlying infection.  Will trend troponin and will get stat EKG.  Hypokalemia -Potassium goal> 4 -10/1 K-Dur 50 mEq  Hypophosphatemia -Phosphorus goal> 2.5  Goals  of care -10/1 Palliative Care; consult placed for patient who has multiple myeloma not responding to therapy, and Covid pneumonia.  Discuss goals of care and possible hospice.   DVT prophylaxis: SCD, no chemical anticoagulation due to severe thrombocytopenia Code Status: DNR Family Communication:  Status is: Inpatient    Dispo: The patient is from: Home              Anticipated d/c is to: Home              Anticipated d/c date is: 10/4              Patient currently unstable      Consultants:  Dr. Marin Olp oncology Phone consult  ID Dr Carlyle Basques   Procedures/Significant Events:  10/1 bilateral lower extremity Doppler; negative DVT 10/2 echocardiogram; reading pending    I have personally reviewed and interpreted all radiology studies and my findings are as above.  VENTILATOR SETTINGS: Room air 10/1 SPO2 96%   Cultures 9/29 SARS coronavirus positive 9/29 influenza A/B negative 9/29 urine positive staph hemolyticus 9/29 blood RIGHT AC Positive Staph Epidermidis    Antimicrobials: Anti-infectives (From admission, onward)   Start     Ordered Stop   10/22/19 1000  remdesivir 100 mg in sodium chloride 0.9 % 100 mL IVPB  Status:  Discontinued       "Followed by" Linked Group Details   10/21/19 1421 10/21/19 1426   10/22/19 1000  remdesivir 100 mg in sodium chloride 0.9 % 100 mL IVPB       "Followed by" Linked Group Details  10/21/19 1428 10/26/19 0959   10/22/19 0100  vancomycin (VANCOREADY) IVPB 750 mg/150 mL  Status:  Discontinued        10/21/19 1350 10/21/19 1422   10/21/19 1500  valACYclovir (VALTREX) tablet 1,000 mg        10/21/19 1409     10/21/19 1430  remdesivir 200 mg in sodium chloride 0.9% 250 mL IVPB  Status:  Discontinued       "Followed by" Linked Group Details   10/21/19 1421 10/21/19 1426   10/21/19 1430  remdesivir 200 mg in sodium chloride 0.9% 250 mL IVPB       "Followed by" Linked Group Details   10/21/19 1428 10/21/19 1633    10/21/19 1400  ceFEPIme (MAXIPIME) 2 g in sodium chloride 0.9 % 100 mL IVPB        10/21/19 1350     10/21/19 1345  ceFEPIme (MAXIPIME) 2 g in sodium chloride 0.9 % 100 mL IVPB  Status:  Discontinued        10/21/19 1343 10/21/19 1346   10/21/19 1345  vancomycin (VANCOCIN) IVPB 1000 mg/200 mL premix  Status:  Discontinued        10/21/19 1343 10/21/19 1346   10/21/19 1030  vancomycin (VANCOREADY) IVPB 1500 mg/300 mL  Status:  Discontinued        10/21/19 1017 10/21/19 1448   10/21/19 1015  ceFEPIme (MAXIPIME) 2 g in sodium chloride 0.9 % 100 mL IVPB        10/21/19 1000 10/21/19 1103   10/21/19 1015  metroNIDAZOLE (FLAGYL) IVPB 500 mg        10/21/19 1000 10/21/19 1247   10/21/19 1015  vancomycin (VANCOCIN) IVPB 1000 mg/200 mL premix  Status:  Discontinued        10/21/19 1000 10/21/19 1017       Devices    LINES / TUBES:      Continuous Infusions: . ceFEPime (MAXIPIME) IV 2 g (10/24/19 1323)  . remdesivir 100 mg in NS 100 mL 100 mg (10/24/19 0914)  . vancomycin 1,250 mg (10/24/19 1059)     Objective: Vitals:   10/23/19 1659 10/23/19 2142 10/24/19 0157 10/24/19 0600  BP: (!) 158/83 130/76 139/77 122/77  Pulse: (!) 112 (!) 111 94 86  Resp: _0 Temp: 98.8 F (37.1 C) 98.8 F (37.1 C) 97.9 F (36.6 C) 98.2 F (36.8 C)  TempSrc: Oral Oral Oral Oral  SpO2: 98% 95% 96% 95%  Weight:      Height:        Intake/Output Summary (Last 24 hours) at 10/24/2019 1643 Last data filed at 10/24/2019 0400 Gross per 24 hour  Intake 1340 ml  Output 525 ml  Net 815 ml   Filed Weights   10/23/19 1521  Weight: 88 kg    Physical Exam:  General: A/O x4, No acute respiratory distress Eyes: negative scleral hemorrhage, negative anisocoria, negative icterus ENT: Negative Runny nose, negative gingival bleeding, Neck:  Negative scars, masses, torticollis, lymphadenopathy, JVD Lungs: Clear to auscultation bilaterally without wheezes or crackles Cardiovascular: Regular rate  and rhythm without murmur gallop or rub normal S1 and S2 Abdomen: negative abdominal pain, nondistended, positive soft, bowel sounds, no rebound, no ascites, no appreciable mass Extremities: No significant cyanosis, clubbing, or edema bilateral lower extremities Skin: Negative rashes, lesions, ulcers Psychiatric:  Negative depression, negative anxiety, negative fatigue, negative mania  Central nervous system:  Cranial nerves II through XII intact, tongue/uvula midline, all extremities muscle strength 5/5,  sensation intact throughout, negative dysarthria, negative expressive aphasia, negative receptive aphasia.  .     Data Reviewed: Care during the described time interval was provided by me .  I have reviewed this patient's available data, including medical history, events of note, physical examination, and all test results as part of my evaluation.  CBC: Recent Labs  Lab 10/20/19 0950 10/21/19 1028 10/22/19 0330 10/23/19 0251 10/24/19 0414  WBC 2.8* 2.4* 1.2* 1.0* 1.2*  NEUTROABS 2.0 1.9 0.9* 0.7* 0.7*  HGB 7.2* 9.4* 6.3* 7.1* 7.2*  HCT 21.4* 27.2* 18.0* 20.8* 22.2*  MCV 93.9 93.5 93.8 92.4 97.4  PLT 10* 14* 10* 11* 9*   Basic Metabolic Panel: Recent Labs  Lab 10/21/19 1028 10/21/19 1606 10/22/19 0330 10/23/19 0251 10/24/19 0414  NA 140 139 139 140 140  K 3.0* 3.2* 3.4* 3.3* 4.0  CL 104 106 106 107 107  CO2 22 22 20* 22 22  GLUCOSE 123* 109* 99 99 177*  BUN 26* 25* 22 25* 28*  CREATININE 1.24 1.17 1.10 1.01 0.87  CALCIUM 9.5 8.9 8.9 8.3* 8.5*  MG 1.9  --  1.8 2.0 2.3  PHOS 3.2  --  2.2* 2.6 2.4*   GFR: Estimated Creatinine Clearance: 67.9 mL/min (by C-G formula based on SCr of 0.87 mg/dL). Liver Function Tests: Recent Labs  Lab 10/21/19 1028 10/21/19 1606 10/22/19 0330 10/23/19 0251 10/24/19 0414  AST 42* 45* 54* 67* 58*  ALT _0 32  ALKPHOS 50 44 40 46 49  BILITOT 0.8 1.1 1.3* 1.1 0.7  PROT 7.9 7.1 6.9 7.1 7.2  ALBUMIN 3.4* 3.0* 2.7* 2.9* 2.5*    No results for input(s): LIPASE, AMYLASE in the last 168 hours. No results for input(s): AMMONIA in the last 168 hours. Coagulation Profile: Recent Labs  Lab 10/21/19 1028 10/22/19 0330  INR 1.3* 1.6*   Cardiac Enzymes: No results for input(s): CKTOTAL, CKMB, CKMBINDEX, TROPONINI in the last 168 hours. BNP (last 3 results) No results for input(s): PROBNP in the last 8760 hours. HbA1C: No results for input(s): HGBA1C in the last 72 hours. CBG: No results for input(s): GLUCAP in the last 168 hours. Lipid Profile: No results for input(s): CHOL, HDL, LDLCALC, TRIG, CHOLHDL, LDLDIRECT in the last 72 hours. Thyroid Function Tests: No results for input(s): TSH, T4TOTAL, FREET4, T3FREE, THYROIDAB in the last 72 hours. Anemia Panel: Recent Labs    10/23/19 0251 10/24/19 0414  FERRITIN 2,548* 2,818*   Sepsis Labs: Recent Labs  Lab 10/21/19 1028 10/21/19 1606 10/22/19 0330  PROCALCITON  --  4.33 3.68  LATICACIDVEN 2.0*  2.0*  --   --     Recent Results (from the past 240 hour(s))  Blood Culture (routine x 2)     Status: None (Preliminary result)   Collection Time: 10/21/19 10:20 AM   Specimen: BLOOD  Result Value Ref Range Status   Specimen Description   Final    BLOOD RIGHT ARM Performed at [facility], [place] [address], [place], [place] [address]    Special Requests   Final    BOTTLES DRAWN AEROBIC AND ANAEROBIC Blood Culture results may not be optimal due to an inadequate volume of blood received in culture bottles Performed at [place] [address] [place], [place] [address]    Culture   Final    NO GROWTH 3 DAYS Performed at [facility], [place] [address], [place], [place] [address]    Report Status  PENDING  Incomplete  Urine culture     Status: Abnormal   Collection Time: 10/21/19 10:28 AM   Specimen: In/Out Cath Urine  Result Value Ref Range Status   Specimen Description   Final    IN/OUT CATH  URINE Performed at [facility], [place] [address] [place], [place] [address]    Special Requests   Final    NONE Performed at [facility], [place] [address], [place], [place] [address]    Culture 3,000 COLONIES/mL STAPHYLOCOCCUS HAEMOLYTICUS (A)  Final   Report Status 10/23/2019 FINAL  Final   Organism ID, Bacteria STAPHYLOCOCCUS HAEMOLYTICUS (A)  Final      Susceptibility   Staphylococcus haemolyticus - MIC*    CIPROFLOXACIN >=8 RESISTANT Resistant     GENTAMICIN <=0.5 SENSITIVE Sensitive     NITROFURANTOIN 32 SENSITIVE Sensitive     OXACILLIN >=4 RESISTANT Resistant     TETRACYCLINE <=1 SENSITIVE Sensitive     VANCOMYCIN <=0.5 SENSITIVE Sensitive     TRIMETH/SULFA <=10 SENSITIVE Sensitive     CLINDAMYCIN RESISTANT Resistant     RIFAMPIN <=0.5 SENSITIVE Sensitive     Inducible Clindamycin POSITIVE Resistant     * 3,000 COLONIES/mL STAPHYLOCOCCUS HAEMOLYTICUS  Blood Culture (routine x 2)     Status: Abnormal   Collection Time: 10/21/19 10:30 AM   Specimen: BLOOD  Result Value Ref Range Status   Specimen Description   Final    BLOOD RIGHT ANTECUBITAL Performed at [place] [address], [place], [place] [address]    Special Requests   Final    BOTTLES DRAWN AEROBIC AND ANAEROBIC Blood Culture adequate volume Performed at [place] [address] [place], [place] [address]    Culture  Setup Time   Final    GRAM POSITIVE COCCI IN BOTH AEROBIC AND ANAEROBIC BOTTLES CRITICAL RESULT CALLED TO, READ BACK BY AND VERIFIED WITH: PHARM D M.LLIASTON AT 6734 ON 10/22/2019 BY T.SAAD    Culture (A)  Final    STAPHYLOCOCCUS EPIDERMIDIS THE SIGNIFICANCE OF ISOLATING THIS ORGANISM FROM A SINGLE SET OF BLOOD CULTURES WHEN MULTIPLE SETS ARE DRAWN IS UNCERTAIN. PLEASE NOTIFY THE MICROBIOLOGY DEPARTMENT WITHIN ONE WEEK IF SPECIATION AND SENSITIVITIES ARE REQUIRED. Performed at [facility], [place] [address] [place], [place] [address]    Report Status 10/24/2019 FINAL  Final  Blood Culture ID Panel (Reflexed)     Status: Abnormal   Collection Time: 10/21/19 10:30 AM  Result Value Ref Range Status   Enterococcus faecalis NOT DETECTED NOT DETECTED Final   Enterococcus Faecium NOT DETECTED NOT DETECTED Final   Listeria monocytogenes NOT DETECTED NOT DETECTED Final   Staphylococcus species DETECTED (A) NOT DETECTED Final    Comment: CRITICAL RESULT CALLED TO, READ BACK BY AND VERIFIED WITH: PHARM D M.LLIASTON AT 0724 ON 10/22/2019 BY T.SAAD    Staphylococcus aureus (BCID) NOT DETECTED NOT DETECTED Final   Staphylococcus epidermidis DETECTED (A) NOT DETECTED Final    Comment: Methicillin (oxacillin) resistant coagulase negative staphylococcus. Possible blood culture contaminant (unless isolated from more than one blood culture draw or clinical case suggests pathogenicity). No antibiotic treatment is indicated for blood  culture contaminants. PHARM D M.LLIASTON AT 0240 ON 10/22/2019 BY T.SAAD    Staphylococcus lugdunensis NOT DETECTED NOT DETECTED Final   Streptococcus species NOT DETECTED NOT DETECTED Final   Streptococcus agalactiae NOT DETECTED NOT DETECTED Final   Streptococcus pneumoniae NOT DETECTED NOT DETECTED Final   Streptococcus  pyogenes NOT DETECTED NOT DETECTED Final   A.calcoaceticus-baumannii NOT DETECTED NOT DETECTED Final   Bacteroides fragilis NOT DETECTED NOT DETECTED Final   Enterobacterales NOT DETECTED NOT DETECTED Final   Enterobacter cloacae complex NOT DETECTED NOT DETECTED Final   Escherichia coli NOT DETECTED NOT DETECTED Final   Klebsiella aerogenes NOT DETECTED NOT DETECTED Final   Klebsiella oxytoca NOT DETECTED NOT DETECTED Final   Klebsiella pneumoniae NOT DETECTED NOT DETECTED Final   Proteus species NOT DETECTED NOT DETECTED Final   Salmonella species NOT DETECTED NOT DETECTED Final   Serratia marcescens NOT DETECTED NOT DETECTED Final   Haemophilus  influenzae NOT DETECTED NOT DETECTED Final   Neisseria meningitidis NOT DETECTED NOT DETECTED Final   Pseudomonas aeruginosa NOT DETECTED NOT DETECTED Final   Stenotrophomonas maltophilia NOT DETECTED NOT DETECTED Final   Candida albicans NOT DETECTED NOT DETECTED Final   Candida auris NOT DETECTED NOT DETECTED Final   Candida glabrata NOT DETECTED NOT DETECTED Final   Candida krusei NOT DETECTED NOT DETECTED Final   Candida parapsilosis NOT DETECTED NOT DETECTED Final   Candida tropicalis NOT DETECTED NOT DETECTED Final   Cryptococcus neoformans/gattii NOT DETECTED NOT DETECTED Final   Methicillin resistance mecA/C DETECTED (A) NOT DETECTED Final    Comment: PHARM D M.LLIASTON AT 1610 ON 10/22/2019 BY T.SAAD Performed at [facility], [place] [address] [place],  [address]   Respiratory Panel by RT PCR (Flu A&B, Covid) - Nasopharyngeal Swab     Status: Abnormal   Collection Time: 10/21/19 12:51 PM   Specimen: Nasopharyngeal Swab  Result Value Ref Range Status   SARS Coronavirus 2 by RT PCR POSITIVE (A) NEGATIVE Final    Comment: RESULT CALLED TO, READ BACK BY AND VERIFIED WITH: CLAPP,S. RN _0  ON 09.29.2021 BY COHEN,K (NOTE) SARS-CoV-2 target nucleic acids are DETECTED.  SARS-CoV-2 RNA is generally detectable in upper respiratory specimens  during the acute phase of infection. Positive results are indicative of the presence of the identified virus, but do not rule out bacterial infection or co-infection with other pathogens not detected by the test. Clinical correlation with patient history and other diagnostic information is necessary to determine patient infection status. The expected result is Negative.  Fact Sheet for Patients:  PinkCheek.be  Fact Sheet for Healthcare Providers: GravelBags.it  This test is not yet approved or cleared by the [place] FDA and  has been authorized for detection and/or  diagnosis of SARS-CoV-2 by FDA under an Emergency Use Authorization (EUA).  This EUA will remain in effect (meaning this test  can be used) for the duration of  the COVID-19 declaration under Section 564(b)(1) of the Act, 21 U.S.C. section 360bbb-3(b)(1), unless the authorization is terminated or revoked sooner.      Influenza A by PCR NEGATIVE NEGATIVE Final   Influenza B by PCR NEGATIVE NEGATIVE Final    Comment: (NOTE) The Xpert Xpress SARS-CoV-2/FLU/RSV assay is intended as an aid in  the diagnosis of influenza from Nasopharyngeal swab specimens and  should not be used as a sole basis for treatment. Nasal washings and  aspirates are unacceptable for Xpert Xpress SARS-CoV-2/FLU/RSV  testing.  Fact Sheet for Patients: PinkCheek.be  Fact Sheet for Healthcare Providers: GravelBags.it  This test is not yet approved or cleared by the [place] FDA and  has been authorized for detection and/or diagnosis of SARS-CoV-2 by  FDA under an Emergency Use Authorization (EUA). This EUA will remain  in effect (meaning this test can be  used) for the duration of the  Covid-19 declaration under Section 564(b)(1) of the Act, 21  U.S.C. section 360bbb-3(b)(1), unless the authorization is  terminated or revoked. Performed at [facility], [place] [address] [place], [place] [address]          Radiology Studies: VAS [place] LOWER EXTREMITY VENOUS (DVT)  Result Date: 10/24/2019  Lower Venous DVT Study Indications: Elevated Ddimer.  Risk Factors: COVID 19 positive. Limitations: Poor ultrasound/tissue interface. Comparison Study: No prior studies. Performing Technologist: Oliver Hum RVT  Examination Guidelines: A complete evaluation includes B-mode imaging, spectral Doppler, color Doppler, and power Doppler as needed of all accessible portions of each vessel. Bilateral testing is considered an integral part of a complete  examination. Limited examinations for reoccurring indications may be performed as noted. The reflux portion of the exam is performed with the patient in reverse Trendelenburg.  +---------+---------------+---------+-----------+----------+--------------+ RIGHT    CompressibilityPhasicitySpontaneityPropertiesThrombus Aging +---------+---------------+---------+-----------+----------+--------------+ CFV      Full           Yes      Yes                                 +---------+---------------+---------+-----------+----------+--------------+ SFJ      Full                                                        +---------+---------------+---------+-----------+----------+--------------+ FV Prox  Full                                                        +---------+---------------+---------+-----------+----------+--------------+ FV Mid   Full                                                        +---------+---------------+---------+-----------+----------+--------------+ FV DistalFull                                                        +---------+---------------+---------+-----------+----------+--------------+ PFV      Full                                                        +---------+---------------+---------+-----------+----------+--------------+ POP      Full           Yes      Yes                                 +---------+---------------+---------+-----------+----------+--------------+ PTV      Full                                                        +---------+---------------+---------+-----------+----------+--------------+  PERO     Full                                                        +---------+---------------+---------+-----------+----------+--------------+   +---------+---------------+---------+-----------+----------+--------------+ LEFT     CompressibilityPhasicitySpontaneityPropertiesThrombus Aging  +---------+---------------+---------+-----------+----------+--------------+ CFV      Full           Yes      Yes                                 +---------+---------------+---------+-----------+----------+--------------+ SFJ      Full                                                        +---------+---------------+---------+-----------+----------+--------------+ FV Prox  Full                                                        +---------+---------------+---------+-----------+----------+--------------+ FV Mid   Full                                                        +---------+---------------+---------+-----------+----------+--------------+ FV DistalFull                                                        +---------+---------------+---------+-----------+----------+--------------+ PFV      Full                                                        +---------+---------------+---------+-----------+----------+--------------+ POP      Full           Yes      Yes                                 +---------+---------------+---------+-----------+----------+--------------+ PTV      Full                                                        +---------+---------------+---------+-----------+----------+--------------+ PERO     Full                                                        +---------+---------------+---------+-----------+----------+--------------+  Summary: RIGHT: - There is no evidence of deep vein thrombosis in the lower extremity.  - No cystic structure found in the popliteal fossa.  LEFT: - There is no evidence of deep vein thrombosis in the lower extremity.  - No cystic structure found in the popliteal fossa.  *See table(s) above for measurements and observations. Electronically signed by Harold Barban MD on 10/24/2019 at 1:07:18 PM.    Final    ECHOCARDIOGRAM LIMITED  Result Date: 10/24/2019    ECHOCARDIOGRAM LIMITED REPORT    Patient Name:   Gene Hill Date of Exam: 10/24/2019 Medical Rec #:  [id number]       Height:       65.0 in Accession #:    [id number]      Weight:       194.0 lb Date of Birth:  [birth year]       BSA:          1.953 m Patient Age:    77 years        BP:           122/77 mmHg Patient Gender: M               HR:           89 bpm. Exam Location:  Inpatient Procedure: Limited Echo, Limited Color Doppler and Cardiac Doppler Indications:    Bacteremia 790.7 / R78.81  History:        Patient has prior history of Echocardiogram examinations, most                 recent 12/13/2015. Signs/Symptoms:Murmur; Risk                 Factors:Hypertension, Dyslipidemia and Former Smoker. GERD.  Sonographer:    Alvino Chapel RCS Referring Phys: [place] Comments: Covid positive. IMPRESSIONS  1. Left ventricular ejection fraction, by estimation, is 65 to 70%. The left ventricle has normal function. Left ventricular diastolic parameters are consistent with Grade I diastolic dysfunction (impaired relaxation).  2. Right ventricular systolic function was not well visualized. The right ventricular size is not well visualized.  3. The mitral valve is normal in structure. Mild mitral valve regurgitation. No evidence of mitral stenosis.  4. The aortic valve is tricuspid. There is mild calcification of the aortic valve. There is mild thickening of the aortic valve. Aortic valve regurgitation is not visualized. No aortic stenosis is present.  5. The inferior vena cava is dilated in size with >50% respiratory variability, suggesting right atrial pressure of 8 mmHg.  6. Limited echo for bacteremia. No evidence of endocarditis. FINDINGS  Left Ventricle: Left ventricular ejection fraction, by estimation, is 65 to 70%. The left ventricle has normal function. The left ventricular internal cavity size was normal in size. There is no left ventricular hypertrophy. Left ventricular diastolic parameters are consistent with Grade I  diastolic dysfunction (impaired relaxation). Normal left ventricular filling pressure. Right Ventricle: The right ventricular size is not well visualized. Right vetricular wall thickness was not well visualized. Right ventricular systolic function was not well visualized. Left Atrium: Left atrial size was not well visualized. Right Atrium: Right atrial size was not well visualized. Pericardium: There is no evidence of pericardial effusion. Mitral Valve: The mitral valve is normal in structure. There is mild thickening of the mitral valve leaflet(s). There is mild calcification of the mitral valve leaflet(s). Mild mitral annular calcification. Mild mitral valve regurgitation. No evidence of  mitral valve stenosis. Tricuspid Valve: The tricuspid valve is normal in structure. Tricuspid valve regurgitation is not demonstrated. No evidence of tricuspid stenosis. Aortic Valve: The aortic valve is tricuspid. There is mild calcification of the aortic valve. There is mild thickening of the aortic valve. There is mild aortic valve annular calcification. Aortic valve regurgitation is not visualized. No aortic stenosis  is present. Aortic valve mean gradient measures 6.4 mmHg. Aortic valve peak gradient measures 12.3 mmHg. Aortic valve area, by VTI measures 2.95 cm. Pulmonic Valve: The pulmonic valve was not well visualized. Pulmonic valve regurgitation is not visualized. No evidence of pulmonic stenosis. Aorta: The aortic root is normal in size and structure. Pulmonary Artery: Indeterminant PASP, inadequate TR jet. Venous: The inferior vena cava is dilated in size with greater than 50% respiratory variability, suggesting right atrial pressure of 8 mmHg. LEFT VENTRICLE PLAX 2D LVOT diam:     2.10 cm      Diastology LV SV:         96           LV e' medial:    8.05 cm/s LV SV Index:   49           LV E/e' medial:  10.6 LVOT Area:     3.46 cm     LV e' lateral:   9.90 cm/s                             LV E/e' lateral: 8.6  LV  Volumes (MOD) LV vol d, MOD A2C: 91.8 ml LV vol d, MOD A4C: 158.0 ml LV vol s, MOD A2C: 32.0 ml LV vol s, MOD A4C: 53.7 ml LV SV MOD A2C:     59.8 ml LV SV MOD A4C:     158.0 ml LV SV MOD BP:      80.1 ml AORTIC VALVE AV Area (Vmax):    2.73 cm AV Area (Vmean):   2.85 cm AV Area (VTI):     2.95 cm AV Vmax:           175.10 cm/s AV Vmean:          118.296 cm/s AV VTI:            0.325 m AV Peak Grad:      12.3 mmHg AV Mean Grad:      6.4 mmHg LVOT Vmax:         138.00 cm/s LVOT Vmean:        97.400 cm/s LVOT VTI:          0.277 m LVOT/AV VTI ratio: 0.85  AORTA Ao Root diam: 3.50 cm MITRAL VALVE MV Area (PHT): 4.80 cm     SHUNTS MV Decel Time: 158 msec     Systemic VTI:  0.28 m MR Peak grad: 106.5 mmHg    Systemic Diam: 2.10 cm MR Vmax:      516.00 cm/s MV E velocity: 85.30 cm/s MV A velocity: 111.00 cm/s MV E/A ratio:  0.77 Carlyle Dolly MD Electronically signed by Carlyle Dolly MD Signature Date/Time: 10/24/2019/3:54:13 PM    Final         Scheduled Meds: . (feeding supplement) PROSource Plus  30 mL Oral BID BM  . sodium chloride   Intravenous Once  . vitamin C  500 mg Oral Daily  . Chlorhexidine Gluconate Cloth  6 each Topical Daily  . dronabinol  2.5 mg Oral BID AC  .  methylPREDNISolone (SOLU-MEDROL) injection  100 mg Intravenous Q12H  . metoprolol tartrate  25 mg Oral BID  . mirtazapine  7.5 mg Oral QHS  . multivitamin with minerals  1 tablet Oral Daily  . tamsulosin  0.4 mg Oral QHS  . valACYclovir  1,000 mg Oral Daily  . zinc sulfate  220 mg Oral Daily   Continuous Infusions: . ceFEPime (MAXIPIME) IV 2 g (10/24/19 1323)  . remdesivir 100 mg in NS 100 mL 100 mg (10/24/19 0914)  . vancomycin 1,250 mg (10/24/19 1059)     LOS: 3 days    Time spent:40 min    Brynnan Rodenbaugh, Geraldo Docker, MD [facility] Pager [phone]  If 7PM-7AM, please contact night-coverage [email or web address] Password [facility] 10/24/2019, 4:43 PM

## 2019-10-24 NOTE — Plan of Care (Signed)
Pt has no s/s of sepsis at this time. Problem: Education: Goal: Knowledge of risk factors and measures for prevention of condition will improve Outcome: Progressing   Problem: Coping: Goal: Psychosocial and spiritual needs will be supported Outcome: Progressing   Problem: Respiratory: Goal: Will maintain a patent airway Outcome: Progressing Goal: Complications related to the disease process, condition or treatment will be avoided or minimized Outcome: Progressing

## 2019-10-24 NOTE — Progress Notes (Signed)
Initial Nutrition Assessment  DOCUMENTATION CODES:   Obesity unspecified  INTERVENTION:  - will order Hormel Shake once/day, each supplement provides 500 kcal and 22 grams protein. - will order Magic Cup with dinner meals, each supplement provides 290 kcal and 9 grams of protein. - will order 30 ml Prosource Plus BID, each supplement provides 100 kcal and 15 grams protein.  - will order 1 tablet multivitamin with minerals/day.   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 male with medical history of multiple myeloma, HTN, HLD, GERD, CAD s/p cath in 1998, chronic lower back pain, arthritis, and pancytopenia. He presented to the ED with generalized weakness, fever, decreased appetite, and not feeling well overall. He received the second dose of the COVID vaccine on 9/24. He denied N/V or abdominal pain PTA. He was admitted for sepsis from unknown cause/source.  No intakes documented since diet advanced from NPO to Heart Healthy on 9/30 at 1950. Unable to reach patient by phone--noted currently listed as "cardiac ultrasound".   Weight yesterday was 194 lb and weight has been stable over the past 3 months. No comment/information listed in the MST report.   Per notes: - no N/V/D or abdominal pain - improving appetite with marinol ordered - met sepsis criteria on admission - respiratory panel positive for COVID-19; CXR negative for x-ray - fully vaccinated against COVID-19 - multiple myeloma    Labs reviewed; BUN: 28 mg/dl, Ca: 8.5 mg/dl, Phos: 2,4 mg/dl. Medications reviewed; 500 mg ascorbic acid/day, 1.5 mg marinol BID, 100 mg IV remdesivir x1 dose/day x4 days  (9/30-10/3), 220 mg zinc sulfate/day.     NUTRITION - FOCUSED PHYSICAL EXAM:  unable to complete at this time.   Diet Order:   Diet Order            Diet Heart Room service appropriate? Yes; Fluid consistency: Thin  Diet effective now                 EDUCATION NEEDS:   No education needs have been identified at this time  Skin:  Skin Assessment: Skin Integrity Issues: Skin Integrity Issues:: DTI DTI: bilateral great toes  Last BM:  PTA/unknown  Height:   Ht Readings from Last 1 Encounters:  10/23/19 5' 5"  (1.651 m)    Weight:   Wt Readings from Last 1 Encounters:  10/23/19 88 kg    Estimated Nutritional Needs:  Kcal:  1760-1935 kcal Protein:  90-105 grams Fluid:  >/= 2 L/day     Jarome Matin, MS, RD, LDN, CNSC Inpatient Clinical Dietitian RD pager # available in AMION  After hours/weekend pager # available in [facility]

## 2019-10-24 NOTE — Progress Notes (Signed)
Looks like Gene Hill has gram-positive cocci in his blood and urine.  This is staph epi.  I do have to worry that this is a pathologic organism.  2 blood cultures are positive.  The urine is positive.  He has the Port-A-Cath in place.  We will start him on vancomycin.  He is on Maxipime already.  We may be able to stop the Maxipime.  He says he feels a little bit better.  His appetite might be a little bit better.  His labs show white cell count 1.2.  Hemoglobin 7.2.  Platelet count 9000.  His BUN is 20 creatinine 0.87.  His albumin is 2.5.  Potassium 4.0.  He does look a little bit better.  He looks a bit stronger.  He has had no problems with nausea or vomiting.  There is no diarrhea.  He is afebrile.  Temperature 98.2.  Pulse 86.  Blood pressure 112/77.  His lungs sound pretty clear bilaterally.  Cardiac exam regular rate and rhythm.  I cannot hear any obvious murmur.  Abdomen is soft.  Bowel sounds are present.  He has no guarding or rebound tenderness.  Extremities show some slight edema in his legs.  Skin exam shows no rashes.  Neurological exam is nonfocal.  Again, [name] is growing staph his blood.  Again I worry that this is a true pathologic organism because of his Port-A-Cath and multiple cultures that are positive.  Vancomycin is needed.  It is still hard to believe that this COVID is a real issue.  He has had no temperatures.  He really has had no shortness of breath or cough.  I know he is getting outstanding care from all staff upon 4 W.  I appreciate their compassion and their professional approach to patient care.  Lattie Haw, MD  [place] 14:14

## 2019-10-24 NOTE — Progress Notes (Signed)
Pharmacy Antibiotic Note  Gene Hill is a 79 y.o. male admitted on 10/21/2019 with suspected sepsis from an unknown source.  PMH significant for multiple myeloma (undergoing chemo).  Blood cultures now positive for Staph Epi, MecA positive, in 1/4 bottles, likely contaminant.  Pharmacy consulted to dose cefepime. 10/2: Oncology is concerned that the Staph epi may be a pathologic organisim and is consulting pharmacy for vancomycin dosing.   10/24/2019 Afebrile Scr 1.01>0.87 WBC 1.2  Plan:  Increase cefepime to 2g iv q 8 hours   Initiate vancomycin 1250 mg iv q 24 hours   Predicted auc 541  Levels if indicated  Follow renal function  F/u culture results/sensitivities  Height: 5' 5"  (165.1 cm) Weight: 88 kg (194 lb) IBW/kg (Calculated) : 61.5  Temp (24hrs), Avg:98.6 F (37 C), Min:97.9 F (36.6 C), Max:99.2 F (37.3 C)  Recent Labs  Lab 10/20/19 0950 10/21/19 1028 10/21/19 1606 10/22/19 0330 10/23/19 0251 10/24/19 0414  WBC 2.8* 2.4*  --  1.2* 1.0* 1.2*  CREATININE 1.13 1.24 1.17 1.10 1.01 0.87  LATICACIDVEN  --  2.0*  2.0*  --   --   --   --     Estimated Creatinine Clearance: 67.9 mL/min (by C-G formula based on SCr of 0.87 mg/dL).    No Known Allergies  Antimicrobials this admission: 9/29 Metronidazole x 1 9/29 Vanc >>  9/29, 10/2 vanc >> 9/29 Cefepime >>  Dose adjustments this admission:   Microbiology results:  9/29 BCx: 2/4 bottles GPC 9/29 UCx:  3k colonies/ml Staph haemolyticus 9/29 Resp Panel: Influenza A/B neg, COVID positive 9/30 BCID:  Staphylococcus epidermidis, mecA+  Thank you for allowing pharmacy to be a part of this patient's care.  Ulice Dash, PharmD, BCPS [phone] 10/24/2019, 8:16 AM

## 2019-10-25 DIAGNOSIS — B957 Other staphylococcus as the cause of diseases classified elsewhere: Secondary | ICD-10-CM

## 2019-10-25 DIAGNOSIS — R7881 Bacteremia: Secondary | ICD-10-CM

## 2019-10-25 LAB — CBC WITH DIFFERENTIAL/PLATELET
Abs Immature Granulocytes: 0.16 10*3/uL — ABNORMAL HIGH (ref 0.00–0.07)
Basophils Absolute: 0 10*3/uL (ref 0.0–0.1)
Basophils Relative: 1 %
Eosinophils Absolute: 0 10*3/uL (ref 0.0–0.5)
Eosinophils Relative: 0 %
HCT: 18.7 % — ABNORMAL LOW (ref 39.0–52.0)
Hemoglobin: 6.3 g/dL — CL (ref 13.0–17.0)
Immature Granulocytes: 15 %
Lymphocytes Relative: 8 %
Lymphs Abs: 0.1 10*3/uL — ABNORMAL LOW (ref 0.7–4.0)
MCH: 32 pg (ref 26.0–34.0)
MCHC: 33.7 g/dL (ref 30.0–36.0)
MCV: 94.9 fL (ref 80.0–100.0)
Monocytes Absolute: 0.1 10*3/uL (ref 0.1–1.0)
Monocytes Relative: 7 %
Neutro Abs: 0.8 10*3/uL — ABNORMAL LOW (ref 1.7–7.7)
Neutrophils Relative %: 69 %
Platelets: 17 10*3/uL — CL (ref 150–400)
RBC: 1.97 MIL/uL — ABNORMAL LOW (ref 4.22–5.81)
RDW: 19.7 % — ABNORMAL HIGH (ref 11.5–15.5)
WBC: 1.1 10*3/uL — CL (ref 4.0–10.5)
nRBC: 1.8 % — ABNORMAL HIGH (ref 0.0–0.2)

## 2019-10-25 LAB — PHOSPHORUS: Phosphorus: 2.5 mg/dL (ref 2.5–4.6)

## 2019-10-25 LAB — BPAM PLATELET PHERESIS
Blood Product Expiration Date: 202110052359
ISSUE DATE / TIME: 202110022042
Unit Type and Rh: 6200

## 2019-10-25 LAB — COMPREHENSIVE METABOLIC PANEL
ALT: 36 U/L (ref 0–44)
AST: 47 U/L — ABNORMAL HIGH (ref 15–41)
Albumin: 2.4 g/dL — ABNORMAL LOW (ref 3.5–5.0)
Alkaline Phosphatase: 44 U/L (ref 38–126)
Anion gap: 14 (ref 5–15)
BUN: 39 mg/dL — ABNORMAL HIGH (ref 8–23)
CO2: 21 mmol/L — ABNORMAL LOW (ref 22–32)
Calcium: 8.1 mg/dL — ABNORMAL LOW (ref 8.9–10.3)
Chloride: 108 mmol/L (ref 98–111)
Creatinine, Ser: 0.78 mg/dL (ref 0.61–1.24)
GFR calc Af Amer: >60 mL/min (ref >60)
GFR calc non Af Amer: >60 mL/min (ref >60)
Glucose, Bld: 152 mg/dL — ABNORMAL HIGH (ref 70–99)
Potassium: 4 mmol/L (ref 3.5–5.1)
Sodium: 143 mmol/L (ref 135–145)
Total Bilirubin: 0.8 mg/dL (ref 0.3–1.2)
Total Protein: 6.8 g/dL (ref 6.5–8.1)

## 2019-10-25 LAB — FERRITIN: Ferritin: 2629 ng/mL — ABNORMAL HIGH (ref 24–336)

## 2019-10-25 LAB — LACTATE DEHYDROGENASE: LDH: 242 U/L — ABNORMAL HIGH (ref 98–192)

## 2019-10-25 LAB — PREPARE RBC (CROSSMATCH)

## 2019-10-25 LAB — PREPARE PLATELET PHERESIS: Unit division: 0

## 2019-10-25 LAB — C-REACTIVE PROTEIN: CRP: 17.5 mg/dL — ABNORMAL HIGH (ref <1.0)

## 2019-10-25 LAB — MAGNESIUM: Magnesium: 2.5 mg/dL — ABNORMAL HIGH (ref 1.7–2.4)

## 2019-10-25 LAB — D-DIMER, QUANTITATIVE: D-Dimer, Quant: 1.39 ug/mL-FEU — ABNORMAL HIGH (ref 0.00–0.50)

## 2019-10-25 MED ORDER — SODIUM CHLORIDE 0.9% IV SOLUTION: Freq: Once | INTRAVENOUS | Status: DC

## 2019-10-25 MED ORDER — SODIUM CHLORIDE 0.9 % IV SOLN
2.0000 g | Freq: Three times a day (TID) | INTRAVENOUS | Status: DC
  Administered 2019-10-25 – 2019-10-26 (×4): 2 g via INTRAVENOUS
  Filled 2019-10-25 (×4): qty 2

## 2019-10-25 NOTE — Progress Notes (Signed)
Blood bank called to state blood is now ready for transfusion. Will update oncoming shift. SRP, RN

## 2019-10-25 NOTE — Progress Notes (Signed)
Received call from blood bank inquiring about this patient's order to transfuse" Patient generally requires a phenotypical match and his current sample is close to expiration, meaning that if the physician wants this unit to be a direct match it would have to come from [place] and a new sample will need to be drawn from the patient. If however the physician would like to proceed with the closest compatible unit then the blood bank can match from the current sample.

## 2019-10-25 NOTE — Progress Notes (Addendum)
PROGRESS NOTE    Gene Hill  ZHY:[id number] DOB: 06-Mar-1940 DOA: 10/21/2019 PCP: Colon Branch, MD     Brief Narrative:    79 y.o. WM PMHx Multiple myeloma, HTN, CAD, s/p cath 1998, HLD, GERD,  lower back pain, arthritis, pancytopenia   Presents to emergency department with generalized weakness and not feeling well overall.  Patient tells me that he has not been feeling well, has fever and generalized weakness started last night. He received blood transfusion yesterday. Reports that last night he was feeling extremely weak and had a fever. He received second dose of COVID-19 vaccine on Friday and thinks that his symptoms could be related to his second shot. Reports decreased appetite as well.  He denies headache, blurry vision, lightheadedness, dizziness, cough, congestion, nausea, vomiting, abdominal pain, urinary or bowel changes. Denies melena, hematemesis, hemoptysis, epistaxis, petechiae or bruises on his body.  He has a port on right upper chest however he denies redness or swelling around the port site.  No history of smoking, alcohol, illicit drug use. His wife called EMS and reported his temperature was 100.4 in route to ED.  ED Course: Upon arrival to ED: Patient had fever of 101.4, tachycardic, tachypneic, lactic acid of 2.0, WBC of 2.4, platelet: 14, potassium: 3.0, UA positive for rare bacteria, POC COVID-19 negative. Chest x-ray shows minimal bibasilar atelectasis. Patient received IV fluid, cefepime, vancomycin, Flagyl and potassium in ED. [facility] hospitalist consulted for admission for sepsis secondary to unknown cause.   Subjective: 10/3 afebrile overnight A/O x4, negative S OB, negative abdominal pain, negative nausea, negative vomiting.  States did receive platelets last night.  Assessment & Plan: Covid vaccination; vaccinated second shot on Friday   Principal Problem:   Sepsis ([place]) Active Problems:   Multiple myeloma ([place])   Hyperlipidemia   Essential  hypertension   GERD (gastroesophageal reflux disease)   Pancytopenia ([place])   Sepsis secondary to UTI ([place])   Coag negative Staphylococcus bacteremia    Sepsis UTI/positive staph hemolyticus (only 3K colonies but patient immunocompromised)  -On admission patient met criteria for sepsis HR> 90, RR> 20, CBC<4, pancytopenia - Respiratory panel positive for COVID-19.,  -Chest x-ray negative for pneumonia.  He is maintaining oxygen saturation on room air -Patient received IV fluid, cefepime, vancomycin and Flagyl in ED. -10/1 patient currently on cefepime -BC, UC, positive see results below Results for DESHONE, LYSSY (MRN [id number]) as of 10/23/2019 19:59  Ref. Range 10/21/2019 16:06 10/22/2019 03:30  Procalcitonin Latest Units: ng/mL 4.33 3.68  -Although procalcitonin and other inflammatory markers can be elevated secondary to Covid believe most likely patient has true bacterial infection secondary to his compromised immune system.  Positive Staph Epidermidis Bacteremia (1 bottle but patient immunocompromised) -Very concerned for patient given that he has compromised immune system, afraid this is a true infection. -Discussed case with Dr. Carlyle Basques, ID who will weigh in on case given patient's multiple medical problems. -Concurs with vancomycin for now -10/2 repeat blood cultures, if positive will need to remove Port-A-Cath    Covid infection COVID-19 Labs  Recent Labs    10/23/19 0251 10/24/19 0414 10/25/19 0400  DDIMER 1.24* 1.99* 1.39*  FERRITIN 2,548* 2,818* 2,629*  LDH 253* 263* 242*  CRP 30.4* 28.4* 17.5*    Lab Results  Component Value Date   SARSCOV2NAA POSITIVE (A) 10/21/2019   [place] NEGATIVE 02/16/2019   [place] NOT DETECTED 11/15/2018   [place] NEGATIVE 06/23/2018  -Patient fully vaccinated against Covid 19 -Remdesivir per pharmacy  protocol -10/1 Solu-Medrol 100 mg BID.  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 -10/2 discussed with ID Dr Carlyle Basques who concurred with current treatment plan  -Explained to patient that lack of symptoms most likely secondary to the lack of his immune system.  COVID-19 does the majority of his damage by causing an immune cascade, but since his immune system is more or less nonexistent secondary to ongoing chemotherapy cannot mount an an effective immune response.  Counseled believe this is why he is not experienced any serious pulmonary, GI etc. Covid related symptoms.  Elevated D-dimer -Patient with new onset right inner thigh pain.  Lump on palpation feels like a cord.  DVT? -10/1 lower extremity Doppler negative for DVT  Essential HTN -10/1 Metoprolol 25 mg  BID (home dose 50 mg)  -Strict in and out -Daily weight  Chronic diastolic CHF grade 1 -See HTN  Multiple myeloma: -Currently getting chemo.  Received blood transfusion yesterday -Followed by oncology Dr. Marin Olp oncology outpatient. -EDP consulted oncology -Per Dr. Antonieta Pert note will start Neupogen   Pancytopenia: -In the setting of multiple myeloma -WBC 2.4, H&H: 9.4/27.2, platelet: 14 -No signs of active bleeding -See multiple myeloma. -10/30 discussed case with Dr. Marin Olp oncology do not transfuse platelets unless<10K or active signs of bleeding.  GERD: -PPI due to severe thrombocytopenia  Depression/anxiety:  -Continue Remeron, Ativan as needed  BPH: -Continue Flomax  Poor appetite:  -Marinol 2.5 mg BID  Please note:  Patient initial troponin came back elevated-253.  Patient denies ACS symptoms.  Likely demand ischemia secondary to underlying infection.  Will trend troponin and will get stat  EKG.  Hypokalemia -Potassium goal> 4  Hypophosphatemia -Phosphorus goal> 2.5  Goals of care -10/1 Palliative Care; consult placed for patient who has multiple myeloma not responding to therapy, and Covid pneumonia.  Discuss goals of care and possible hospice.   DVT prophylaxis: SCD, no chemical anticoagulation due to severe thrombocytopenia Code Status: DNR Family Communication: 10/3 spoke with Lelan Pons (wife) counseled on plan of care and answered all questions  Status is: Inpatient    Dispo: The patient is from: Home              Anticipated d/c is to: Home              Anticipated d/c date is: 10/4              Patient currently unstable      Consultants:  Dr. Marin Olp oncology Phone consult  ID Dr Carlyle Basques   Procedures/Significant Events:  9/30 transfuse 1 unit PRBC 10/1 bilateral lower extremity Doppler; negative DVT 10/2 echocardiogram; Left Ventricle: LVEF= 65 to 70%.  - Grade I diastolic dysfunction (impaired relaxation).  10/2 transfuse 1 unit platelets    I have personally reviewed and interpreted all radiology studies and my findings are as above.  VENTILATOR SETTINGS: Room air 10/3 SPO2 94%   Cultures 9/29 SARS coronavirus positive 9/29 influenza A/B negative 9/29 urine positive staph hemolyticus 9/29 blood RIGHT AC Positive Staph Epidermidis    Antimicrobials: Anti-infectives (From admission, onward)   Start     Ordered Stop   10/22/19 1000  remdesivir 100 mg in sodium chloride 0.9 % 100 mL IVPB  Status:  Discontinued       "Followed by" Linked Group Details   10/21/19 1421 10/21/19 1426   10/22/19 1000  remdesivir 100 mg in sodium chloride 0.9 % 100 mL IVPB       "Followed by" Linked Group Details   10/21/19 1428 10/26/19 0959   10/22/19 0100  vancomycin (VANCOREADY) IVPB 750 mg/150 mL  Status:  Discontinued        10/21/19 1350 10/21/19 1422   10/21/19 1500  valACYclovir (VALTREX) tablet 1,000 mg        10/21/19 1409     10/21/19  1430  remdesivir 200 mg in sodium chloride 0.9% 250 mL IVPB  Status:  Discontinued       "Followed by" Linked Group Details   10/21/19 1421 10/21/19 1426   10/21/19 1430  remdesivir 200 mg in sodium chloride 0.9% 250 mL IVPB       "Followed by" Linked Group Details   10/21/19 1428 10/21/19 1633   10/21/19 1400  ceFEPIme (MAXIPIME) 2 g in sodium chloride 0.9 % 100 mL IVPB        10/21/19 1350     10/21/19 1345  ceFEPIme (MAXIPIME) 2 g in sodium chloride 0.9 % 100 mL IVPB  Status:  Discontinued        10/21/19 1343 10/21/19 1346   10/21/19 1345  vancomycin (VANCOCIN) IVPB 1000 mg/200 mL premix  Status:  Discontinued        10/21/19 1343 10/21/19 1346   10/21/19 1030  vancomycin (VANCOREADY) IVPB 1500 mg/300 mL  Status:  Discontinued        10/21/19 1017 10/21/19 1448   10/21/19 1015  ceFEPIme (MAXIPIME) 2 g in sodium chloride 0.9 % 100 mL IVPB        10/21/19 1000 10/21/19 1103   10/21/19 1015  metroNIDAZOLE (FLAGYL) IVPB 500 mg        10/21/19 1000 10/21/19 1247   10/21/19 1015  vancomycin (VANCOCIN) IVPB 1000 mg/200 mL premix  Status:  Discontinued        10/21/19 1000 10/21/19 1017       Devices    LINES / TUBES:      Continuous Infusions: . ceFEPime (MAXIPIME) IV 2 g (10/25/19 0021)  . remdesivir 100 mg in NS 100 mL Stopped (10/24/19 2101)  . vancomycin Stopped (10/24/19 2102)     Objective: Vitals:   10/24/19 2035 10/24/19 2108 10/24/19 2241 10/25/19 0554  BP: 139/70 125/74 130/78 125/84  Pulse: 95 92 86 78  Resp: $Remo'18 18 16 20  'FBrUY$ Temp: 98.5 F (36.9 C) 98.4 F (36.9 C) 98.2 F (36.8 C) 97.8 F (36.6 C)  TempSrc: Oral Oral Oral Oral  SpO2: 95% 95% 97% 94%  Weight:      Height:        Intake/Output Summary (Last 24 hours) at 10/25/2019 0945 Last data filed at 10/25/2019 0600 Gross per 24 hour  Intake 1137.26 ml  Output 1700 ml  Net -562.74 ml   Filed Weights   10/23/19 1521  Weight: 88 kg    Physical Exam:  General: A/O x4, No acute respiratory  distress Eyes: negative scleral hemorrhage, negative anisocoria, negative icterus ENT: Negative Runny nose, negative gingival bleeding, Neck:  Negative scars, masses, torticollis, lymphadenopathy, JVD Lungs: Clear to auscultation bilaterally without wheezes or crackles Cardiovascular: Regular rate and rhythm without murmur gallop or rub normal S1 and S2 Abdomen: negative abdominal pain, nondistended, positive soft, bowel sounds, no rebound, no ascites, no appreciable mass Extremities:  No significant cyanosis, clubbing, or edema bilateral lower extremities Skin: Negative rashes, lesions, ulcers Psychiatric:  Negative depression, negative anxiety, negative fatigue, negative mania  Central nervous system:  Cranial nerves II through XII intact, tongue/uvula midline, all extremities muscle strength 5/5, sensation intact throughout, negative dysarthria, negative expressive aphasia, negative receptive aphasia.  .     Data Reviewed: Care during the described time interval was provided by me .  I have reviewed this patient's available data, including medical history, events of note, physical examination, and all test results as part of my evaluation.  CBC: Recent Labs  Lab 10/21/19 1028 10/22/19 0330 10/23/19 0251 10/24/19 0414 10/25/19 0400  WBC 2.4* 1.2* 1.0* 1.2* 1.1*  NEUTROABS 1.9 0.9* 0.7* 0.7* 0.8*  HGB 9.4* 6.3* 7.1* 7.2* 6.3*  HCT 27.2* 18.0* 20.8* 22.2* 18.7*  MCV 93.5 93.8 92.4 97.4 94.9  PLT 14* 10* 11* 9* 17*   Basic Metabolic Panel: Recent Labs  Lab 10/21/19 1028 10/21/19 1028 10/21/19 1606 10/22/19 0330 10/23/19 0251 10/24/19 0414 10/25/19 0400  NA 140   < > 139 139 140 140 143  K 3.0*   < > 3.2* 3.4* 3.3* 4.0 4.0  CL 104   < > 106 106 107 107 108  CO2 22   < > 22 20* 22 22 21*  GLUCOSE 123*   < > 109* 99 99 177* 152*  BUN 26*   < > 25* 22 25* 28* 39*  CREATININE 1.24   < > 1.17 1.10 1.01 0.87 0.78  CALCIUM 9.5   < > 8.9 8.9 8.3* 8.5* 8.1*  MG 1.9  --   --   1.8 2.0 2.3 2.5*  PHOS 3.2  --   --  2.2* 2.6 2.4* 2.5   < > = values in this interval not displayed.   GFR: Estimated Creatinine Clearance: 73.9 mL/min (by C-G formula based on SCr of 0.78 mg/dL). Liver Function Tests: Recent Labs  Lab 10/21/19 1606 10/22/19 0330 10/23/19 0251 10/24/19 0414 10/25/19 0400  AST 45* 54* 67* 58* 47*  ALT $Re'17 19 26 'DDz$ 32 36  ALKPHOS 44 40 46 49 44  BILITOT 1.1 1.3* 1.1 0.7 0.8  PROT 7.1 6.9 7.1 7.2 6.8  ALBUMIN 3.0* 2.7* 2.9* 2.5* 2.4*   No results for input(s): LIPASE, AMYLASE in the last 168 hours. No results for input(s): AMMONIA in the last 168 hours. Coagulation Profile: Recent Labs  Lab 10/21/19 1028 10/22/19 0330  INR 1.3* 1.6*   Cardiac Enzymes: No results for input(s): CKTOTAL, CKMB, CKMBINDEX, TROPONINI in the last 168 hours. BNP (last 3 results) No results for input(s): PROBNP in the last 8760 hours. HbA1C: No results for input(s): HGBA1C in the last 72 hours. CBG: No results for input(s): GLUCAP in the last 168 hours. Lipid Profile: No results for input(s): CHOL, HDL, LDLCALC, TRIG, CHOLHDL, LDLDIRECT in the last 72 hours. Thyroid Function Tests: No results for input(s): TSH, T4TOTAL, FREET4, T3FREE, THYROIDAB in the last 72 hours. Anemia Panel: Recent Labs    10/24/19 0414 10/25/19 0400  FERRITIN 2,818* 2,629*   Sepsis Labs: Recent Labs  Lab 10/21/19 1028 10/21/19 1606 10/22/19 0330  PROCALCITON  --  4.33 3.68  LATICACIDVEN 2.0*  2.0*  --   --     Recent Results (from the past 240 hour(s))  Blood Culture (routine x 2)     Status: None (Preliminary result)   Collection Time: 10/21/19 10:20 AM   Specimen: BLOOD  Result Value Ref Range Status  Specimen Description   Final    BLOOD RIGHT ARM Performed at [facility], [address], [place], [place] [address]    Special Requests   Final    BOTTLES DRAWN AEROBIC AND ANAEROBIC Blood Culture results may not be optimal due to an inadequate volume  of blood received in culture bottles Performed at [facility], [address], [place], [place] [address]    Culture   Final    NO GROWTH 3 DAYS Performed at [facility], [address] [place], [place] [address]    Report Status PENDING  Incomplete  Urine culture     Status: Abnormal   Collection Time: 10/21/19 10:28 AM   Specimen: In/Out Cath Urine  Result Value Ref Range Status   Specimen Description   Final    IN/OUT CATH URINE Performed at [facility], [address] [place], [place] [address]    Special Requests   Final    NONE Performed at [facility], [address], [place], [place] [address]    Culture 3,000 COLONIES/mL STAPHYLOCOCCUS HAEMOLYTICUS (A)  Final   Report Status 10/23/2019 FINAL  Final   Organism ID, Bacteria STAPHYLOCOCCUS HAEMOLYTICUS (A)  Final      Susceptibility   Staphylococcus haemolyticus - MIC*    CIPROFLOXACIN >=8 RESISTANT Resistant     GENTAMICIN <=0.5 SENSITIVE Sensitive     NITROFURANTOIN 32 SENSITIVE Sensitive     OXACILLIN >=4 RESISTANT Resistant     TETRACYCLINE <=1 SENSITIVE Sensitive     VANCOMYCIN <=0.5 SENSITIVE Sensitive     TRIMETH/SULFA <=10 SENSITIVE Sensitive     CLINDAMYCIN RESISTANT Resistant     RIFAMPIN <=0.5 SENSITIVE Sensitive     Inducible Clindamycin POSITIVE Resistant     * 3,000 COLONIES/mL STAPHYLOCOCCUS HAEMOLYTICUS  Blood Culture (routine x 2)     Status: Abnormal   Collection Time: 10/21/19 10:30 AM   Specimen: BLOOD  Result Value Ref Range Status   Specimen Description   Final    BLOOD RIGHT ANTECUBITAL Performed at [facility], [address] [place], [place] [address]    Special Requests   Final    BOTTLES DRAWN AEROBIC AND ANAEROBIC Blood Culture adequate volume Performed at [facility], [address], [place], [place] [address]    Culture  Setup Time   Final    GRAM POSITIVE COCCI IN BOTH  AEROBIC AND ANAEROBIC BOTTLES CRITICAL RESULT CALLED TO, READ BACK BY AND VERIFIED WITH: PHARM D M.LLIASTON AT 0724 ON 10/22/2019 BY T.SAAD    Culture (A)  Final    STAPHYLOCOCCUS EPIDERMIDIS THE SIGNIFICANCE OF ISOLATING THIS ORGANISM FROM A SINGLE SET OF BLOOD CULTURES WHEN MULTIPLE SETS ARE DRAWN IS UNCERTAIN. PLEASE NOTIFY THE MICROBIOLOGY DEPARTMENT WITHIN ONE WEEK IF SPECIATION AND SENSITIVITIES ARE REQUIRED. Performed at [facility], [address] [place], [place] [address]    Report Status 10/24/2019 FINAL  Final  Blood Culture ID Panel (Reflexed)     Status: Abnormal   Collection Time: 10/21/19 10:30 AM  Result Value Ref Range Status   Enterococcus faecalis NOT DETECTED NOT DETECTED Final   Enterococcus Faecium NOT DETECTED NOT DETECTED Final   Listeria monocytogenes NOT DETECTED NOT DETECTED Final   Staphylococcus species DETECTED (A) NOT DETECTED Final    Comment: CRITICAL RESULT CALLED TO, READ BACK BY AND VERIFIED WITH: PHARM D M.LLIASTON AT 0724 ON 10/22/2019 BY T.SAAD    Staphylococcus  aureus (BCID) NOT DETECTED NOT DETECTED Final   Staphylococcus epidermidis DETECTED (A) NOT DETECTED Final    Comment: Methicillin (oxacillin) resistant coagulase negative staphylococcus. Possible blood culture contaminant (unless isolated from more than one blood culture draw or clinical case suggests pathogenicity). No antibiotic treatment is indicated for blood  culture contaminants. PHARM D M.LLIASTON AT 9326 ON 10/22/2019 BY T.SAAD    Staphylococcus lugdunensis NOT DETECTED NOT DETECTED Final   Streptococcus species NOT DETECTED NOT DETECTED Final   Streptococcus agalactiae NOT DETECTED NOT DETECTED Final   Streptococcus pneumoniae NOT DETECTED NOT DETECTED Final   Streptococcus pyogenes NOT DETECTED NOT DETECTED Final   A.calcoaceticus-baumannii NOT DETECTED NOT DETECTED Final   Bacteroides fragilis NOT DETECTED NOT DETECTED Final   Enterobacterales NOT DETECTED NOT  DETECTED Final   Enterobacter cloacae complex NOT DETECTED NOT DETECTED Final   Escherichia coli NOT DETECTED NOT DETECTED Final   Klebsiella aerogenes NOT DETECTED NOT DETECTED Final   Klebsiella oxytoca NOT DETECTED NOT DETECTED Final   Klebsiella pneumoniae NOT DETECTED NOT DETECTED Final   Proteus species NOT DETECTED NOT DETECTED Final   Salmonella species NOT DETECTED NOT DETECTED Final   Serratia marcescens NOT DETECTED NOT DETECTED Final   Haemophilus influenzae NOT DETECTED NOT DETECTED Final   Neisseria meningitidis NOT DETECTED NOT DETECTED Final   Pseudomonas aeruginosa NOT DETECTED NOT DETECTED Final   Stenotrophomonas maltophilia NOT DETECTED NOT DETECTED Final   Candida albicans NOT DETECTED NOT DETECTED Final   Candida auris NOT DETECTED NOT DETECTED Final   Candida glabrata NOT DETECTED NOT DETECTED Final   Candida krusei NOT DETECTED NOT DETECTED Final   Candida parapsilosis NOT DETECTED NOT DETECTED Final   Candida tropicalis NOT DETECTED NOT DETECTED Final   Cryptococcus neoformans/gattii NOT DETECTED NOT DETECTED Final   Methicillin resistance mecA/C DETECTED (A) NOT DETECTED Final    Comment: PHARM D M.LLIASTON AT 7124 ON 10/22/2019 BY T.SAAD Performed at [facility], [place] [address] [place], [place] [address]   Respiratory Panel by RT PCR (Flu A&B, Covid) - Nasopharyngeal Swab     Status: Abnormal   Collection Time: 10/21/19 12:51 PM   Specimen: Nasopharyngeal Swab  Result Value Ref Range Status   SARS Coronavirus 2 by RT PCR POSITIVE (A) NEGATIVE Final    Comment: RESULT CALLED TO, READ BACK BY AND VERIFIED WITH: CLAPP,S. RN $RemoveBef'@1412'UlgChiMBpZ$  ON 09.29.2021 BY COHEN,K (NOTE) SARS-CoV-2 target nucleic acids are DETECTED.  SARS-CoV-2 RNA is generally detectable in upper respiratory specimens  during the acute phase of infection. Positive results are indicative of the presence of the identified virus, but do not rule out bacterial infection or co-infection with  other pathogens not detected by the test. Clinical correlation with patient history and other diagnostic information is necessary to determine patient infection status. The expected result is Negative.  Fact Sheet for Patients:  PinkCheek.be  Fact Sheet for Healthcare Providers: GravelBags.it  This test is not yet approved or cleared by the [place] FDA and  has been authorized for detection and/or diagnosis of SARS-CoV-2 by FDA under an Emergency Use Authorization (EUA).  This EUA will remain in effect (meaning this test  can be used) for the duration of  the COVID-19 declaration under Section 564(b)(1) of the Act, 21 U.S.C. section 360bbb-3(b)(1), unless the authorization is terminated or revoked sooner.      Influenza A by PCR NEGATIVE NEGATIVE Final   Influenza B by PCR NEGATIVE NEGATIVE Final    Comment: (NOTE) The  Xpert Xpress SARS-CoV-2/FLU/RSV assay is intended as an aid in  the diagnosis of influenza from Nasopharyngeal swab specimens and  should not be used as a sole basis for treatment. Nasal washings and  aspirates are unacceptable for Xpert Xpress SARS-CoV-2/FLU/RSV  testing.  Fact Sheet for Patients: PinkCheek.be  Fact Sheet for Healthcare Providers: GravelBags.it  This test is not yet approved or cleared by the [place] FDA and  has been authorized for detection and/or diagnosis of SARS-CoV-2 by  FDA under an Emergency Use Authorization (EUA). This EUA will remain  in effect (meaning this test can be used) for the duration of the  Covid-19 declaration under Section 564(b)(1) of the Act, 21  U.S.C. section 360bbb-3(b)(1), unless the authorization is  terminated or revoked. Performed at [facility], [place] [address] [place], [place] [address]   Culture, blood (routine x 2)     Status: None (Preliminary result)    Collection Time: 10/24/19  6:37 PM   Specimen: BLOOD  Result Value Ref Range Status   Specimen Description   Final    BLOOD BLOOD RIGHT FOREARM Performed at [place] [address] [place], [place] [address]    Special Requests   Final    BOTTLES DRAWN AEROBIC ONLY Blood Culture results may not be optimal due to an excessive volume of blood received in culture bottles Performed at [place] [address], [place], [place] [address]    Culture PENDING  Incomplete   Report Status PENDING  Incomplete         Radiology Studies: VAS [place] LOWER EXTREMITY VENOUS (DVT)  Result Date: 10/24/2019  Lower Venous DVT Study Indications: Elevated Ddimer.  Risk Factors: COVID 19 positive. Limitations: Poor ultrasound/tissue interface. Comparison Study: No prior studies. Performing Technologist: Oliver Hum RVT  Examination Guidelines: A complete evaluation includes B-mode imaging, spectral Doppler, color Doppler, and power Doppler as needed of all accessible portions of each vessel. Bilateral testing is considered an integral part of a complete examination. Limited examinations for reoccurring indications may be performed as noted. The reflux portion of the exam is performed with the patient in reverse Trendelenburg.  +---------+---------------+---------+-----------+----------+--------------+ RIGHT    CompressibilityPhasicitySpontaneityPropertiesThrombus Aging +---------+---------------+---------+-----------+----------+--------------+ CFV      Full           Yes      Yes                                 +---------+---------------+---------+-----------+----------+--------------+ SFJ      Full                                                        +---------+---------------+---------+-----------+----------+--------------+ FV Prox  Full                                                         +---------+---------------+---------+-----------+----------+--------------+ FV Mid   Full                                                        +---------+---------------+---------+-----------+----------+--------------+  FV DistalFull                                                        +---------+---------------+---------+-----------+----------+--------------+ PFV      Full                                                        +---------+---------------+---------+-----------+----------+--------------+ POP      Full           Yes      Yes                                 +---------+---------------+---------+-----------+----------+--------------+ PTV      Full                                                        +---------+---------------+---------+-----------+----------+--------------+ PERO     Full                                                        +---------+---------------+---------+-----------+----------+--------------+   +---------+---------------+---------+-----------+----------+--------------+ LEFT     CompressibilityPhasicitySpontaneityPropertiesThrombus Aging +---------+---------------+---------+-----------+----------+--------------+ CFV      Full           Yes      Yes                                 +---------+---------------+---------+-----------+----------+--------------+ SFJ      Full                                                        +---------+---------------+---------+-----------+----------+--------------+ FV Prox  Full                                                        +---------+---------------+---------+-----------+----------+--------------+ FV Mid   Full                                                        +---------+---------------+---------+-----------+----------+--------------+ FV DistalFull                                                         +---------+---------------+---------+-----------+----------+--------------+  PFV      Full                                                        +---------+---------------+---------+-----------+----------+--------------+ POP      Full           Yes      Yes                                 +---------+---------------+---------+-----------+----------+--------------+ PTV      Full                                                        +---------+---------------+---------+-----------+----------+--------------+ PERO     Full                                                        +---------+---------------+---------+-----------+----------+--------------+     Summary: RIGHT: - There is no evidence of deep vein thrombosis in the lower extremity.  - No cystic structure found in the popliteal fossa.  LEFT: - There is no evidence of deep vein thrombosis in the lower extremity.  - No cystic structure found in the popliteal fossa.  *See table(s) above for measurements and observations. Electronically signed by Harold Barban MD on 10/24/2019 at 1:07:18 PM.    Final    ECHOCARDIOGRAM LIMITED  Result Date: 10/24/2019    ECHOCARDIOGRAM LIMITED REPORT   Patient Name:   ESWIN WORRELL Date of Exam: 10/24/2019 Medical Rec #:  [id number]       Height:       65.0 in Accession #:    [id number]      Weight:       194.0 lb Date of Birth:  1940-06-20       BSA:          1.953 m Patient Age:    42 years        BP:           122/77 mmHg Patient Gender: M               HR:           89 bpm. Exam Location:  Inpatient Procedure: Limited Echo, Limited Color Doppler and Cardiac Doppler Indications:    Bacteremia 790.7 / R78.81  History:        Patient has prior history of Echocardiogram examinations, most                 recent 12/13/2015. Signs/Symptoms:Murmur; Risk                 Factors:Hypertension, Dyslipidemia and Former Smoker. GERD.  Sonographer:    Alvino Chapel RCS Referring Phys: [place] Comments: Covid positive. IMPRESSIONS  1. Left ventricular ejection fraction, by estimation, is 65 to 70%. The left ventricle has normal function. Left ventricular diastolic parameters are consistent with Grade I diastolic dysfunction (  impaired relaxation).  2. Right ventricular systolic function was not well visualized. The right ventricular size is not well visualized.  3. The mitral valve is normal in structure. Mild mitral valve regurgitation. No evidence of mitral stenosis.  4. The aortic valve is tricuspid. There is mild calcification of the aortic valve. There is mild thickening of the aortic valve. Aortic valve regurgitation is not visualized. No aortic stenosis is present.  5. The inferior vena cava is dilated in size with >50% respiratory variability, suggesting right atrial pressure of 8 mmHg.  6. Limited echo for bacteremia. No evidence of endocarditis. FINDINGS  Left Ventricle: Left ventricular ejection fraction, by estimation, is 65 to 70%. The left ventricle has normal function. The left ventricular internal cavity size was normal in size. There is no left ventricular hypertrophy. Left ventricular diastolic parameters are consistent with Grade I diastolic dysfunction (impaired relaxation). Normal left ventricular filling pressure. Right Ventricle: The right ventricular size is not well visualized. Right vetricular wall thickness was not well visualized. Right ventricular systolic function was not well visualized. Left Atrium: Left atrial size was not well visualized. Right Atrium: Right atrial size was not well visualized. Pericardium: There is no evidence of pericardial effusion. Mitral Valve: The mitral valve is normal in structure. There is mild thickening of the mitral valve leaflet(s). There is mild calcification of the mitral valve leaflet(s). Mild mitral annular calcification. Mild mitral valve regurgitation. No evidence of  mitral valve stenosis. Tricuspid Valve: The tricuspid  valve is normal in structure. Tricuspid valve regurgitation is not demonstrated. No evidence of tricuspid stenosis. Aortic Valve: The aortic valve is tricuspid. There is mild calcification of the aortic valve. There is mild thickening of the aortic valve. There is mild aortic valve annular calcification. Aortic valve regurgitation is not visualized. No aortic stenosis  is present. Aortic valve mean gradient measures 6.4 mmHg. Aortic valve peak gradient measures 12.3 mmHg. Aortic valve area, by VTI measures 2.95 cm. Pulmonic Valve: The pulmonic valve was not well visualized. Pulmonic valve regurgitation is not visualized. No evidence of pulmonic stenosis. Aorta: The aortic root is normal in size and structure. Pulmonary Artery: Indeterminant PASP, inadequate TR jet. Venous: The inferior vena cava is dilated in size with greater than 50% respiratory variability, suggesting right atrial pressure of 8 mmHg. LEFT VENTRICLE PLAX 2D LVOT diam:     2.10 cm      Diastology LV SV:         96           LV e' medial:    8.05 cm/s LV SV Index:   49           LV E/e' medial:  10.6 LVOT Area:     3.46 cm     LV e' lateral:   9.90 cm/s                             LV E/e' lateral: 8.6  LV Volumes (MOD) LV vol d, MOD A2C: 91.8 ml LV vol d, MOD A4C: 158.0 ml LV vol s, MOD A2C: 32.0 ml LV vol s, MOD A4C: 53.7 ml LV SV MOD A2C:     59.8 ml LV SV MOD A4C:     158.0 ml LV SV MOD BP:      80.1 ml AORTIC VALVE AV Area (Vmax):    2.73 cm AV Area (Vmean):   2.85 cm AV Area (VTI):  2.95 cm AV Vmax:           175.10 cm/s AV Vmean:          118.296 cm/s AV VTI:            0.325 m AV Peak Grad:      12.3 mmHg AV Mean Grad:      6.4 mmHg LVOT Vmax:         138.00 cm/s LVOT Vmean:        97.400 cm/s LVOT VTI:          0.277 m LVOT/AV VTI ratio: 0.85  AORTA Ao Root diam: 3.50 cm MITRAL VALVE MV Area (PHT): 4.80 cm     SHUNTS MV Decel Time: 158 msec     Systemic VTI:  0.28 m MR Peak grad: 106.5 mmHg    Systemic Diam: 2.10 cm MR Vmax:       516.00 cm/s MV E velocity: 85.30 cm/s MV A velocity: 111.00 cm/s MV E/A ratio:  0.77 Carlyle Dolly MD Electronically signed by Carlyle Dolly MD Signature Date/Time: 10/24/2019/3:54:13 PM    Final         Scheduled Meds: . (feeding supplement) PROSource Plus  30 mL Oral BID BM  . sodium chloride   Intravenous Once  . vitamin C  500 mg Oral Daily  . Chlorhexidine Gluconate Cloth  6 each Topical Daily  . dronabinol  2.5 mg Oral BID AC  . methylPREDNISolone (SOLU-MEDROL) injection  100 mg Intravenous Q12H  . metoprolol tartrate  25 mg Oral BID  . mirtazapine  7.5 mg Oral QHS  . multivitamin with minerals  1 tablet Oral Daily  . sodium chloride flush  10-40 mL Intracatheter Q12H  . tamsulosin  0.4 mg Oral QHS  . valACYclovir  1,000 mg Oral Daily  . zinc sulfate  220 mg Oral Daily   Continuous Infusions: . ceFEPime (MAXIPIME) IV 2 g (10/25/19 0021)  . remdesivir 100 mg in NS 100 mL Stopped (10/24/19 2101)  . vancomycin Stopped (10/24/19 2102)     LOS: 4 days    Time spent:40 min    Aletha Allebach, Geraldo Docker, MD [facility] Pager [phone]  If 7PM-7AM, please contact night-coverage [email or web address] Password [email or web address] 10/25/2019, 9:45 AM

## 2019-10-25 NOTE — Consult Note (Addendum)
[facility] for Infectious Disease  Total days of antibiotics 5               Reason for Consult: bacteremia    Referring Physician: woods  Principal Problem:   Sepsis ([place]) Active Problems:   Multiple myeloma ([place])   Hyperlipidemia   Essential hypertension   GERD (gastroesophageal reflux disease)   Pancytopenia (HCC)   Sepsis secondary to UTI ([place])   Coag negative Staphylococcus bacteremia    HPI: [place] is a 79 y.o. male with multiple myeloma who was admitted for flu like illness and weakness but also had recently received the covid vaccine. He is currently on chemo for MM: bendamustine on 9/21, pegfilgrastrim 9/23, and blood tsf on 9/28. On admit, he was pancytopenic but also positive for covid-19. procalcitonin of 4.33, His infectious work up showed staph epi (MRSE on BCID) in 1 of 2 sets of blood cx. Urine cx had 3,000 cols staph species. He was started on remdesivir and steroids for covid-19. cxr showing minimal atelectesis no patchy infiltrates. He has required intermittent blood tsf. Wbc of 1.1, hgb 6.3, plt 17, anc 800 on today's labs. Repeat blood cx ngtd from 10/2. He remains on vanco/cefepime for febrile neutropenia protocol. He is on 5th day of remdesivir.  ID asked to weign in on whether we need to pull port given bacteremia    Past Medical History:  Diagnosis Date  . Arthritis   . 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 Blood dyscrasia    low WBC d/t multiple myeloma  . Bulging lumbar disc    L2-3  . Carotid bruit    3-11 carotid u/s was  (-)  . 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 GERD (gastroesophageal reflux disease)   . Headache   . Heart murmur    doesn't have one  . History of hiatal hernia   . HYPERLIPIDEMIA 05/03/2006  . HYPERTENSION 05/03/2006  . 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 Torn rotator cuff 01/22/2017   left- [place]    Allergies: No Known Allergies   MEDICATIONS: . (feeding supplement) PROSource Plus  30 mL Oral BID BM  . sodium chloride   Intravenous Once  . vitamin C  500 mg Oral Daily  . Chlorhexidine Gluconate Cloth  6 each Topical Daily  . dronabinol  2.5 mg Oral BID AC  . methylPREDNISolone (SOLU-MEDROL) injection  100 mg Intravenous Q12H  . metoprolol tartrate  25 mg Oral BID  . mirtazapine  7.5 mg Oral QHS  . multivitamin with minerals  1 tablet Oral Daily  . sodium chloride flush  10-40 mL Intracatheter Q12H  . tamsulosin  0.4 mg Oral QHS  . valACYclovir  1,000 mg Oral Daily  . zinc sulfate  220 mg Oral Daily    Social History   Tobacco Use  . Smoking status: Former Smoker    Packs/day: 0.25    Years: 20.00    Pack years: 5.00    Types: Cigarettes    Start date: 04/28/1954    Quit date: 11/28/1974    Years since quitting: 44.9  . Smokeless tobacco: Never Used  . Tobacco comment: quit 35 years ago, 1976  Vaping Use  . Vaping Use: Never used  Substance  Use Topics  . Alcohol use: Yes    Alcohol/week: 0.0 standard drinks    Comment: occ  . Drug use: No    Family History  Problem Relation Age of Onset  . Kidney cancer Father   . Leukemia Mother   . Pulmonary embolism Brother   . Stroke Maternal Grandmother   . Heart attack Maternal Grandfather   . Stroke Paternal Grandmother   . Cerebral palsy Son   . Prostate cancer Neg Hx   . Colon cancer Neg Hx   . Esophageal cancer Neg Hx   . Rectal cancer Neg Hx   . Stomach cancer Neg Hx     Review of Systems - Fever, chills, myalgias, no pain at port site. 12 point ros is negative   OBJECTIVE: Temp:  [97.8 F (36.6 C)-98.5 F (36.9 C)] 98 F (36.7 C) (10/03 1226) Pulse Rate:  [78-100] 85 (10/03 1226) Resp:  [16-20] 18 (10/03 1226) BP: (125-145)/(70-87) 129/74 (10/03 1226) SpO2:  [94 %-97 %] 96 % (10/03 1226) Physical Exam    Constitutional: He is oriented to person, place, and time. He appears well-developed and well-nourished. No distress.  HENT:  Mouth/Throat: Oropharynx is clear and moist. No oropharyngeal exudate.  Cardiovascular: Normal rate, regular rhythm and normal heart sounds. Exam reveals no gallop and no friction rub.  No murmur heard.  Chest wall = right sided port is c/d/i no surrounding erythema Pulmonary/Chest: Effort normal and breath sounds normal. No respiratory distress. He has no wheezes.  Abdominal: Soft. Bowel sounds are normal. He exhibits no distension. There is no tenderness.  Lymphadenopathy:  He has no cervical adenopathy.  Neurological: He is alert and oriented to person, place, and time.  Skin: Skin is warm and dry. No rash noted. No erythema.  Psychiatric: He has a normal mood and affect. His behavior is normal.     LABS: Results for orders placed or performed during the hospital encounter of 10/21/19 (from the past 48 hour(s))  C-reactive protein     Status: Abnormal   Collection Time: 10/24/19  4:14 AM  Result Value Ref Range   CRP 28.4 (H) <1.0 mg/dL    Comment: Performed at [facility], [place] [address] [place], [place] [address]  CBC with Differential/Platelet     Status: Abnormal   Collection Time: 10/24/19  4:14 AM  Result Value Ref Range   WBC 1.2 (LL) 4.0 - 10.5 K/uL    Comment: REPEATED TO VERIFY CRITICAL VALUE NOTED.  VALUE IS CONSISTENT WITH PREVIOUSLY REPORTED AND CALLED VALUE.    RBC 2.28 (L) 4.22 - 5.81 MIL/uL   Hemoglobin 7.2 (L) 13.0 - 17.0 g/dL   HCT 22.2 (L) 39 - 52 %   MCV 97.4 80.0 - 100.0 fL   MCH 31.6 26.0 - 34.0 pg   MCHC 32.4 30.0 - 36.0 g/dL   RDW 19.9 (H) 11.5 - 15.5 %   Platelets 9 (LL) 150 - 400 K/uL    Comment: REPEATED TO VERIFY Immature Platelet Fraction may be clinically indicated, consider ordering this additional test [id number] CRITICAL VALUE NOTED.  VALUE IS CONSISTENT WITH PREVIOUSLY REPORTED AND CALLED  VALUE.    nRBC 0.0 0.0 - 0.2 %   Neutrophils Relative % 53 %   Neutro Abs 0.7 (L) 1.7 - 7.7 K/uL   Lymphocytes Relative 5 %   Lymphs Abs 0.1 (L) 0.7 - 4.0 K/uL   Monocytes Relative 7 %   Monocytes Absolute 0.1 0 - 1 K/uL  Eosinophils Relative 0 %   Eosinophils Absolute 0.0 0 - 0 K/uL   Basophils Relative 1 %   Basophils Absolute 0.0 0 - 0 K/uL   Immature Granulocytes 34 %   Abs Immature Granulocytes 0.41 (H) 0.00 - 0.07 K/uL    Comment: Performed at [facility], [place] [address], [place], [place] [address]  Comprehensive metabolic panel     Status: Abnormal   Collection Time: 10/24/19  4:14 AM  Result Value Ref Range   Sodium 140 135 - 145 mmol/L   Potassium 4.0 3.5 - 5.1 mmol/L    Comment: DELTA CHECK NOTED   Chloride 107 98 - 111 mmol/L   CO2 22 22 - 32 mmol/L   Glucose, Bld 177 (H) 70 - 99 mg/dL    Comment: Glucose reference range applies only to samples taken after fasting for at least 8 hours.   BUN 28 (H) 8 - 23 mg/dL   Creatinine, Ser 0.87 0.61 - 1.24 mg/dL   Calcium 8.5 (L) 8.9 - 10.3 mg/dL   Total Protein 7.2 6.5 - 8.1 g/dL   Albumin 2.5 (L) 3.5 - 5.0 g/dL   AST 58 (H) 15 - 41 U/L   ALT 32 0 - 44 U/L   Alkaline Phosphatase 49 38 - 126 U/L   Total Bilirubin 0.7 0.3 - 1.2 mg/dL   GFR calc non Af Amer >60 >60 mL/min   GFR calc Af Amer >60 >60 mL/min   Anion gap 11 5 - 15    Comment: Performed at [facility], [place] [address], [place], [place] [address]  D-dimer, quantitative (not at [facility])     Status: Abnormal   Collection Time: 10/24/19  4:14 AM  Result Value Ref Range   D-Dimer, Quant 1.99 (H) 0.00 - 0.50 ug/mL-FEU    Comment: (NOTE) At the manufacturer cut-off of 0.50 ug/mL FEU, this assay has been documented to exclude PE with a sensitivity and negative predictive value of 97 to 99%.  At this time, this assay has not been approved by the FDA to exclude DVT/VTE. Results should be correlated with clinical  presentation. Performed at [facility], [place] [address], [place], [place] [address]   Ferritin     Status: Abnormal   Collection Time: 10/24/19  4:14 AM  Result Value Ref Range   Ferritin 2,818 (H) 24 - 336 ng/mL    Comment: Performed at [facility], [place] [address] [place], [place] [address]  Magnesium     Status: None   Collection Time: 10/24/19  4:14 AM  Result Value Ref Range   Magnesium 2.3 1.7 - 2.4 mg/dL    Comment: Performed at [facility], [place] [address] [place], [place] [address]  Phosphorus     Status: Abnormal   Collection Time: 10/24/19  4:14 AM  Result Value Ref Range   Phosphorus 2.4 (L) 2.5 - 4.6 mg/dL    Comment: Performed at [facility], [place] [address], [place], [place] [address]  Lactate dehydrogenase     Status: Abnormal   Collection Time: 10/24/19  4:14 AM  Result Value Ref Range   LDH 263 (H) 98 - 192 U/L    Comment: Performed at [facility] [place] [address] [place], [place] [address]  Prepare Pheresed Platelets     Status: None   Collection Time: 10/24/19  8:38 AM  Result Value Ref Range   Unit Number [id number]  Blood Component Type PSORALEN TREATED    Unit division 00    Status of Unit ISSUED,FINAL    Transfusion Status      OK TO TRANSFUSE Performed at [place] [address] [place], [place] [address]   Culture, blood (routine x 2)     Status: None (Preliminary result)   Collection Time: 10/24/19  6:37 PM   Specimen: BLOOD  Result Value Ref Range   Specimen Description      BLOOD BLOOD RIGHT FOREARM Performed at [place] [address], [place], [place] [address]    Special Requests      BOTTLES DRAWN AEROBIC ONLY Blood Culture results may not be optimal due to an excessive volume of blood received in culture bottles   Culture      NO GROWTH < 24 HOURS Performed at [place] [address] [place], [place] [address]    Report Status PENDING   Culture, blood (routine x 2)     Status: None (Preliminary result)   Collection Time: 10/24/19  7:15 PM   Specimen: BLOOD  Result Value Ref Range   Specimen Description      BLOOD PORTA CATH Performed at [place] [address], [place], [place] [address]    Special Requests      BOTTLES DRAWN AEROBIC ONLY Blood Culture adequate volume Performed at [place] [address] [place], [place] [address]    Culture      NO GROWTH < 12 HOURS Performed at [place] [address], [place], [place] [address]    Report Status PENDING   C-reactive protein     Status: Abnormal   Collection Time: 10/25/19  4:00 AM  Result Value Ref Range   CRP 17.5 (H) <1.0 mg/dL    Comment: Performed at [facility], [place] [address], [place],  [address]  CBC with Differential/Platelet     Status: Abnormal   Collection Time: 10/25/19  4:00 AM  Result Value Ref Range   WBC 1.1 (LL) 4.0 - 10.5 K/uL    Comment: REPEATED TO VERIFY CRITICAL VALUE NOTED.  VALUE IS CONSISTENT WITH PREVIOUSLY REPORTED AND CALLED VALUE. THIS CRITICAL RESULT HAS VERIFIED AND BEEN CALLED TO BAKER,T.,RN BY TURNER,SHAWANA ON 10 03 2021 AT 0530, AND HAS BEEN READ BACK.     RBC 1.97 (L) 4.22 - 5.81 MIL/uL   Hemoglobin 6.3 (LL) 13.0 - 17.0 g/dL    Comment: REPEATED TO VERIFY THIS CRITICAL RESULT HAS VERIFIED AND BEEN CALLED TO BAKER,T.,RN BY TURNER,SHAWANA ON 10 03 2021 AT 0530, AND HAS BEEN READ BACK.     HCT 18.7 (L) 39 - 52 %   MCV 94.9 80.0 - 100.0 fL   MCH 32.0 26.0 - 34.0 pg   MCHC 33.7 30.0 - 36.0 g/dL   RDW 19.7 (H) 11.5 - 15.5 %   Platelets 17 (LL) 150 - 400 K/uL    Comment: REPEATED TO VERIFY Immature Platelet Fraction may be clinically indicated, consider ordering this additional test [id number] CRITICAL VALUE NOTED.  VALUE IS CONSISTENT WITH PREVIOUSLY REPORTED AND CALLED  VALUE. THIS CRITICAL RESULT HAS VERIFIED AND BEEN CALLED TO BAKER,T.,RN BY TURNER,SHAWANA ON 10 03 2021 AT 0530, AND HAS BEEN READ BACK.     nRBC 1.8 (H) 0.0 - 0.2 %   Neutrophils Relative % 69 %   Neutro Abs 0.8 (L) 1.7 - 7.7 K/uL  Lymphocytes Relative 8 %   Lymphs Abs 0.1 (L) 0.7 - 4.0 K/uL   Monocytes Relative 7 %   Monocytes Absolute 0.1 0 - 1 K/uL   Eosinophils Relative 0 %   Eosinophils Absolute 0.0 0 - 0 K/uL   Basophils Relative 1 %   Basophils Absolute 0.0 0 - 0 K/uL   Immature Granulocytes 15 %   Abs Immature Granulocytes 0.16 (H) 0.00 - 0.07 K/uL    Comment: Performed at [facility], [place] [address] [place], [place] [address]  Comprehensive metabolic panel     Status: Abnormal   Collection Time: 10/25/19  4:00 AM  Result Value Ref Range   Sodium 143 135 - 145 mmol/L   Potassium 4.0 3.5 - 5.1 mmol/L   Chloride 108 98 - 111 mmol/L   CO2 21 (L) 22 - 32 mmol/L   Glucose, Bld 152 (H) 70 - 99 mg/dL    Comment: Glucose reference range applies only to samples taken after fasting for at least 8 hours.   BUN 39 (H) 8 - 23 mg/dL   Creatinine, Ser 0.78 0.61 - 1.24 mg/dL   Calcium 8.1 (L) 8.9 - 10.3 mg/dL   Total Protein 6.8 6.5 - 8.1 g/dL   Albumin 2.4 (L) 3.5 - 5.0 g/dL   AST 47 (H) 15 - 41 U/L   ALT 36 0 - 44 U/L   Alkaline Phosphatase 44 38 - 126 U/L   Total Bilirubin 0.8 0.3 - 1.2 mg/dL   GFR calc non Af Amer >60 >60 mL/min   GFR calc Af Amer >60 >60 mL/min   Anion gap 14 5 - 15    Comment: Performed at [facility], [place] [address] [place], [place] [address]  D-dimer, quantitative (not at [facility])     Status: Abnormal   Collection Time: 10/25/19  4:00 AM  Result Value Ref Range   D-Dimer, Quant 1.39 (H) 0.00 - 0.50 ug/mL-FEU    Comment: (NOTE) At the manufacturer cut-off of 0.50 ug/mL FEU, this assay has been documented to exclude PE with a sensitivity and negative predictive value of 97 to 99%.  At this time, this assay has not  been approved by the FDA to exclude DVT/VTE. Results should be correlated with clinical presentation. Performed at [facility], [place] [address] [place], [place] [address]   Ferritin     Status: Abnormal   Collection Time: 10/25/19  4:00 AM  Result Value Ref Range   Ferritin 2,629 (H) 24 - 336 ng/mL    Comment: Performed at [facility], [place] [address], [place], [place] [address]  Magnesium     Status: Abnormal   Collection Time: 10/25/19  4:00 AM  Result Value Ref Range   Magnesium 2.5 (H) 1.7 - 2.4 mg/dL    Comment: Performed at [facility], [place] [address] [place], [place] [address]  Phosphorus     Status: None   Collection Time: 10/25/19  4:00 AM  Result Value Ref Range   Phosphorus 2.5 2.5 - 4.6 mg/dL    Comment: Performed at [facility], [place] [address], [place], [place] [address]  Lactate dehydrogenase     Status: Abnormal   Collection Time: 10/25/19  4:00 AM  Result Value Ref Range   LDH 242 (H) 98 - 192 U/L    Comment: Performed at [facility], [place] [address], [place], [place] [address]  Prepare RBC (crossmatch)  Status: None   Collection Time: 10/25/19  5:58 AM  Result Value Ref Range   Order Confirmation      ORDER PROCESSED BY BLOOD BANK Performed at [facility], [place] [address] [place],  [address]    *Note: Due to a large number of results and/or encounters for the requested time period, some results have not been displayed. A complete set of results can be found in Results Review.    MICRO: 10/2 blood cx NGTD 9/29 blood cx 1 set staph epi(BCID MRSE) IMAGING: ECHOCARDIOGRAM LIMITED  Result Date: 10/24/2019    ECHOCARDIOGRAM LIMITED REPORT   Patient Name:   [place] Date of Exam: 10/24/2019 Medical Rec #:  [id number]       Height:       65.0 in Accession #:    [id number]      Weight:       194.0 lb Date of Birth:  06-Sep-1940        BSA:          1.953 m Patient Age:    89 years        BP:           122/77 mmHg Patient Gender: M               HR:           89 bpm. Exam Location:  Inpatient Procedure: Limited Echo, Limited Color Doppler and Cardiac Doppler Indications:    Bacteremia 790.7 / R78.81  History:        Patient has prior history of Echocardiogram examinations, most                 recent 12/13/2015. Signs/Symptoms:Murmur; Risk                 Factors:Hypertension, Dyslipidemia and Former Smoker. GERD.  Sonographer:    Alvino Chapel RCS Referring Phys: [place] Comments: Covid positive. IMPRESSIONS  1. Left ventricular ejection fraction, by estimation, is 65 to 70%. The left ventricle has normal function. Left ventricular diastolic parameters are consistent with Grade I diastolic dysfunction (impaired relaxation).  2. Right ventricular systolic function was not well visualized. The right ventricular size is not well visualized.  3. The mitral valve is normal in structure. Mild mitral valve regurgitation. No evidence of mitral stenosis.  4. The aortic valve is tricuspid. There is mild calcification of the aortic valve. There is mild thickening of the aortic valve. Aortic valve regurgitation is not visualized. No aortic stenosis is present.  5. The inferior vena cava is dilated in size with >50% respiratory variability, suggesting right atrial pressure of 8 mmHg.  6. Limited echo for bacteremia. No evidence of endocarditis. FINDINGS  Left Ventricle: Left ventricular ejection fraction, by estimation, is 65 to 70%. The left ventricle has normal function. The left ventricular internal cavity size was normal in size. There is no left ventricular hypertrophy. Left ventricular diastolic parameters are consistent with Grade I diastolic dysfunction (impaired relaxation). Normal left ventricular filling pressure. Right Ventricle: The right ventricular size is not well visualized. Right vetricular wall thickness was not  well visualized. Right ventricular systolic function was not well visualized. Left Atrium: Left atrial size was not well visualized. Right Atrium: Right atrial size was not well visualized. Pericardium: There is no evidence of pericardial effusion. Mitral Valve: The mitral valve is normal in structure. There is mild thickening of the mitral valve leaflet(s). There is mild  calcification of the mitral valve leaflet(s). Mild mitral annular calcification. Mild mitral valve regurgitation. No evidence of  mitral valve stenosis. Tricuspid Valve: The tricuspid valve is normal in structure. Tricuspid valve regurgitation is not demonstrated. No evidence of tricuspid stenosis. Aortic Valve: The aortic valve is tricuspid. There is mild calcification of the aortic valve. There is mild thickening of the aortic valve. There is mild aortic valve annular calcification. Aortic valve regurgitation is not visualized. No aortic stenosis  is present. Aortic valve mean gradient measures 6.4 mmHg. Aortic valve peak gradient measures 12.3 mmHg. Aortic valve area, by VTI measures 2.95 cm. Pulmonic Valve: The pulmonic valve was not well visualized. Pulmonic valve regurgitation is not visualized. No evidence of pulmonic stenosis. Aorta: The aortic root is normal in size and structure. Pulmonary Artery: Indeterminant PASP, inadequate TR jet. Venous: The inferior vena cava is dilated in size with greater than 50% respiratory variability, suggesting right atrial pressure of 8 mmHg. LEFT VENTRICLE PLAX 2D LVOT diam:     2.10 cm      Diastology LV SV:         96           LV e' medial:    8.05 cm/s LV SV Index:   49           LV E/e' medial:  10.6 LVOT Area:     3.46 cm     LV e' lateral:   9.90 cm/s                             LV E/e' lateral: 8.6  LV Volumes (MOD) LV vol d, MOD A2C: 91.8 ml LV vol d, MOD A4C: 158.0 ml LV vol s, MOD A2C: 32.0 ml LV vol s, MOD A4C: 53.7 ml LV SV MOD A2C:     59.8 ml LV SV MOD A4C:     158.0 ml LV SV MOD BP:       80.1 ml AORTIC VALVE AV Area (Vmax):    2.73 cm AV Area (Vmean):   2.85 cm AV Area (VTI):     2.95 cm AV Vmax:           175.10 cm/s AV Vmean:          118.296 cm/s AV VTI:            0.325 m AV Peak Grad:      12.3 mmHg AV Mean Grad:      6.4 mmHg LVOT Vmax:         138.00 cm/s LVOT Vmean:        97.400 cm/s LVOT VTI:          0.277 m LVOT/AV VTI ratio: 0.85  AORTA Ao Root diam: 3.50 cm MITRAL VALVE MV Area (PHT): 4.80 cm     SHUNTS MV Decel Time: 158 msec     Systemic VTI:  0.28 m MR Peak grad: 106.5 mmHg    Systemic Diam: 2.10 cm MR Vmax:      516.00 cm/s MV E velocity: 85.30 cm/s MV A velocity: 111.00 cm/s MV E/A ratio:  0.77 Carlyle Dolly MD Electronically signed by Carlyle Dolly MD Signature Date/Time: 10/24/2019/3:54:13 PM    Final     Assessment/Plan:  79yo M with immunocompromised host from MM and recent chemo cycle, admitted for fever, covid illness found to have MRSE bacteremia and febrile neutropenia  MRSE bacteremia = continue on vancomycin. Await to  see repeat blood cx. If still +, then will need to treat remove port. Recommend TTE  Febrile neutropenia = for now okay to continue cefepime, given cell counts not improved. Defer to oncology if he would benefit from GCSF  covid-19 illness = continue with steroids, and complete remdesivir today. Will need to be on isolation for 21days given immune suppression.

## 2019-10-25 NOTE — Progress Notes (Signed)
Spoke to [place] in the lab, confirmed that one unit of blood will be available to transfuse tonight and need to be complete prior to midnight before crossmatch is scheduled to expire. SRP, RN

## 2019-10-25 NOTE — Progress Notes (Addendum)
Spoke with Merrilyn Puma [phone]) in the blood bank concerning blood administration. Will transfuse blood when appropriate supply become available. Merrilyn Puma will call when blood is available to give. SRP, RN

## 2019-10-26 LAB — CBC WITH DIFFERENTIAL/PLATELET
Abs Immature Granulocytes: 0.1 10*3/uL — ABNORMAL HIGH (ref 0.00–0.07)
Basophils Absolute: 0 10*3/uL (ref 0.0–0.1)
Basophils Relative: 1 %
Eosinophils Absolute: 0 10*3/uL (ref 0.0–0.5)
Eosinophils Relative: 0 %
HCT: 20.6 % — ABNORMAL LOW (ref 39.0–52.0)
Hemoglobin: 7.1 g/dL — ABNORMAL LOW (ref 13.0–17.0)
Immature Granulocytes: 8 %
Lymphocytes Relative: 5 %
Lymphs Abs: 0.1 10*3/uL — ABNORMAL LOW (ref 0.7–4.0)
MCH: 32.3 pg (ref 26.0–34.0)
MCHC: 34.5 g/dL (ref 30.0–36.0)
MCV: 93.6 fL (ref 80.0–100.0)
Monocytes Absolute: 0.1 10*3/uL (ref 0.1–1.0)
Monocytes Relative: 11 %
Neutro Abs: 1 10*3/uL — ABNORMAL LOW (ref 1.7–7.7)
Neutrophils Relative %: 75 %
Platelets: 15 10*3/uL — CL (ref 150–400)
RBC: 2.2 MIL/uL — ABNORMAL LOW (ref 4.22–5.81)
RDW: 18.7 % — ABNORMAL HIGH (ref 11.5–15.5)
WBC: 1.3 10*3/uL — CL (ref 4.0–10.5)
nRBC: 1.5 % — ABNORMAL HIGH (ref 0.0–0.2)

## 2019-10-26 LAB — COMPREHENSIVE METABOLIC PANEL
ALT: 42 U/L (ref 0–44)
AST: 43 U/L — ABNORMAL HIGH (ref 15–41)
Albumin: 2.4 g/dL — ABNORMAL LOW (ref 3.5–5.0)
Alkaline Phosphatase: 39 U/L (ref 38–126)
Anion gap: 10 (ref 5–15)
BUN: 42 mg/dL — ABNORMAL HIGH (ref 8–23)
CO2: 23 mmol/L (ref 22–32)
Calcium: 7.8 mg/dL — ABNORMAL LOW (ref 8.9–10.3)
Chloride: 106 mmol/L (ref 98–111)
Creatinine, Ser: 0.91 mg/dL (ref 0.61–1.24)
GFR calc Af Amer: >60 mL/min (ref >60)
GFR calc non Af Amer: >60 mL/min (ref >60)
Glucose, Bld: 157 mg/dL — ABNORMAL HIGH (ref 70–99)
Potassium: 3.6 mmol/L (ref 3.5–5.1)
Sodium: 139 mmol/L (ref 135–145)
Total Bilirubin: 0.7 mg/dL (ref 0.3–1.2)
Total Protein: 6.5 g/dL (ref 6.5–8.1)

## 2019-10-26 LAB — TYPE AND SCREEN
ABO/RH(D): A POS
Antibody Screen: NEGATIVE
Unit division: 0
Unit division: 0

## 2019-10-26 LAB — BPAM RBC
Blood Product Expiration Date: 202110092359
Blood Product Expiration Date: 202110312359
ISSUE DATE / TIME: 202109300829
ISSUE DATE / TIME: 202110031945
Unit Type and Rh: 6200
Unit Type and Rh: 6200

## 2019-10-26 LAB — CULTURE, BLOOD (ROUTINE X 2): Culture: NO GROWTH

## 2019-10-26 LAB — C-REACTIVE PROTEIN: CRP: 11.8 mg/dL — ABNORMAL HIGH (ref <1.0)

## 2019-10-26 LAB — PHOSPHORUS: Phosphorus: 2.3 mg/dL — ABNORMAL LOW (ref 2.5–4.6)

## 2019-10-26 LAB — MAGNESIUM: Magnesium: 2.3 mg/dL (ref 1.7–2.4)

## 2019-10-26 LAB — D-DIMER, QUANTITATIVE: D-Dimer, Quant: 1.31 ug/mL-FEU — ABNORMAL HIGH (ref 0.00–0.50)

## 2019-10-26 LAB — LACTATE DEHYDROGENASE: LDH: 218 U/L — ABNORMAL HIGH (ref 98–192)

## 2019-10-26 LAB — FERRITIN: Ferritin: 2683 ng/mL — ABNORMAL HIGH (ref 24–336)

## 2019-10-26 NOTE — TOC Progression Note (Signed)
Transition of Care [facility]) - Progression Note    Patient Details  Name: [place] MRN: [id number] Date of Birth: 20-Jan-1941  Transition of [facility]) CM/SW Contact  Purcell Mouton, RN Phone Number: 10/26/2019, 1:04 PM  Clinical Narrative:    Pt from home with spouse. PT to Eval.    Expected Discharge Plan: Home/Self Care Barriers to Discharge: No Barriers Identified  Expected Discharge Plan and Services Expected Discharge Plan: Home/Self Care       Living arrangements for the past 2 months: Single Family Home                                       Social Determinants of Health (SDOH) Interventions    Readmission Risk Interventions No flowsheet data found.

## 2019-10-26 NOTE — Plan of Care (Signed)
No s/s of sepsis this shift. Problem: Education: Goal: Knowledge of risk factors and measures for prevention of condition will improve Outcome: Progressing   Problem: Coping: Goal: Psychosocial and spiritual needs will be supported Outcome: Progressing   Problem: Respiratory: Goal: Will maintain a patent airway Outcome: Progressing Goal: Complications related to the disease process, condition or treatment will be avoided or minimized Outcome: Progressing

## 2019-10-26 NOTE — Progress Notes (Signed)
[facility]    Date of Admission:  10/21/2019   Total days of antibiotics 6/day 3 of vanco           ID: Gene Hill is a 79 y.o. male with MM, covid-19 plus staph epi bacteremia Principal Problem:   Sepsis [name] Active Problems:   Multiple myeloma ([place])   Hyperlipidemia   Essential hypertension   GERD (gastroesophageal reflux disease)   Pancytopenia ([facility])   Sepsis secondary to UTI ([place])   Coag negative Staphylococcus bacteremia    Subjective: Feeling better. Not having any myalgias, no difficulty or pain at his port site  Medications:  . (feeding supplement) PROSource Plus  30 mL Oral BID BM  . sodium chloride   Intravenous Once  . vitamin C  500 mg Oral Daily  . Chlorhexidine Gluconate Cloth  6 each Topical Daily  . dronabinol  2.5 mg Oral BID AC  . methylPREDNISolone (SOLU-MEDROL) injection  100 mg Intravenous Q12H  . metoprolol tartrate  25 mg Oral BID  . mirtazapine  7.5 mg Oral QHS  . multivitamin with minerals  1 tablet Oral Daily  . sodium chloride flush  10-40 mL Intracatheter Q12H  . tamsulosin  0.4 mg Oral QHS  . valACYclovir  1,000 mg Oral Daily  . zinc sulfate  220 mg Oral Daily    Objective: Vital signs in last 24 hours: Temp:  [97.6 F (36.4 C)-98.5 F (36.9 C)] 98 F (36.7 C) (10/04 1314) Pulse Rate:  [64-79] 69 (10/04 1314) Resp:  [16-18] 18 (10/04 1314) BP: (125-144)/(67-82) 133/68 (10/04 1314) SpO2:  [96 %-98 %] 96 % (10/04 1314) Weight:  [92.5 kg] 92.5 kg (10/04 0500)  Physical Exam  Constitutional: He is oriented to person, place, and time. He appears well-developed and well-nourished. No distress.  HENT:  Mouth/Throat: Oropharynx is clear and moist. No oropharyngeal exudate.  Cardiovascular: Normal rate, regular rhythm and normal heart sounds. Exam reveals no gallop and no friction rub.  No murmur heard.  Chest wall = port is c/d/i Pulmonary/Chest: Effort normal and breath sounds normal. No respiratory  distress. He has no wheezes.  Abdominal: Soft. Bowel sounds are normal. He exhibits no distension. There is no tenderness.  Lymphadenopathy:  He has no cervical adenopathy.  Neurological: He is alert and oriented to person, place, and time.  Skin: Skin is warm and dry. No rash noted. No erythema.  Psychiatric: He has a normal mood and affect. His behavior is normal.     Lab Results Recent Labs    10/25/19 0400 10/26/19 0100  WBC 1.1* 1.3*  HGB 6.3* 7.1*  HCT 18.7* 20.6*  NA 143 139  K 4.0 3.6  CL 108 106  CO2 21* 23  BUN 39* 42*  CREATININE 0.78 0.91   Liver Panel Recent Labs    10/25/19 0400 10/26/19 0100  PROT 6.8 6.5  ALBUMIN 2.4* 2.4*  AST 47* 43*  ALT 36 42  ALKPHOS 44 39  BILITOT 0.8 0.7   Sedimentation Rate No results for input(s): ESRSEDRATE in the last 72 hours. C-Reactive Protein Recent Labs    10/25/19 0400 10/26/19 0100  CRP 17.5* 11.8*    Microbiology:  Studies/Results: No results found.   Assessment/Plan: Staph epi bacteremia = plan to treat for a total of 14 day, OPAT placed by ID pharmacist in chart for end date. Currently day 3 of vanco  covid-19 = steroid taper per dr Grandville Silos. Should remain in isolation for  total of 21 days since positive test. Markers appear improved.--oct 19th is end of isolation at home.  Pancytopenia = being supported by [facility] and plt tsf, still recovering from recent chemo for MM. To follow up with dr Marin Olp. Appears improving. No longer having febrile neutropenia. Can stop cefepime  [facility] Cell: [phone] Pager: [phone]  10/26/2019, 5:00 PM

## 2019-10-26 NOTE — Progress Notes (Signed)
PROGRESS NOTE    Gene Hill  FAO:[id number] DOB: 1940/03/27 DOA: 10/21/2019 PCP: Colon Branch, MD     Brief Narrative:    79 y.o. WM PMHx Multiple myeloma, HTN, CAD, s/p cath 1998, HLD, GERD,  lower back pain, arthritis, pancytopenia   Presents to emergency department with generalized weakness and not feeling well overall.  Patient tells me that he has not been feeling well, has fever and generalized weakness started last night. He received blood transfusion yesterday. Reports that last night he was feeling extremely weak and had a fever. He received second dose of COVID-19 vaccine on Friday and thinks that his symptoms could be related to his second shot. Reports decreased appetite as well.  He denies headache, blurry vision, lightheadedness, dizziness, cough, congestion, nausea, vomiting, abdominal pain, urinary or bowel changes. Denies melena, hematemesis, hemoptysis, epistaxis, petechiae or bruises on his body.  He has a port on right upper chest however he denies redness or swelling around the port site.  No history of smoking, alcohol, illicit drug use. His wife called EMS and reported his temperature was 100.4 in route to ED.  ED Course: Upon arrival to ED: Patient had fever of 101.4, tachycardic, tachypneic, lactic acid of 2.0, WBC of 2.4, platelet: 14, potassium: 3.0, UA positive for rare bacteria, POC COVID-19 negative. Chest x-ray shows minimal bibasilar atelectasis. Patient received IV fluid, cefepime, vancomycin, Flagyl and potassium in ED. [facility] hospitalist consulted for admission for sepsis secondary to unknown cause.   Subjective: 10/4 Afebrile overnight A/O x4, negative S OB, negative abdominal pain    Assessment & Plan: Covid vaccination; vaccinated second shot on Friday   Principal Problem:   Sepsis ([place]) Active Problems:   Multiple myeloma ([place])   Hyperlipidemia   Essential hypertension   GERD (gastroesophageal reflux disease)   Pancytopenia ([facility])    Sepsis secondary to UTI ([place])   Coag negative Staphylococcus bacteremia    Sepsis UTI/positive staph hemolyticus (only 3K colonies but patient immunocompromised)  -On admission patient met criteria for sepsis HR> 90, RR> 20, CBC<4, pancytopenia - Respiratory panel positive for COVID-19.,  -Chest x-ray negative for pneumonia.  He is maintaining oxygen saturation on room air -Patient received IV fluid, cefepime, vancomycin and Flagyl in ED. -10/1 patient currently on cefepime -BC, UC, positive see results below Results for Gene, Hill (MRN [id number]) as of 10/23/2019 19:59  Ref. Range 10/21/2019 16:06 10/22/2019 03:30  Procalcitonin Latest Units: ng/mL 4.33 3.68  -Although procalcitonin and other inflammatory markers can be elevated secondary to Covid believe most likely patient has true bacterial infection secondary to his compromised immune system.  Positive Staph Epidermidis Bacteremia (1 bottle but patient immunocompromised) -Very concerned for patient given that he has compromised immune system, afraid this is a true infection. -Discussed case with Dr. Carlyle Basques, ID who will weigh in on case given patient's multiple medical problems. -Concurs with vancomycin for now -10/2 repeat blood cultures, if positive will need to remove Port-A-Cath.  Cultures NGTD -10/4 DC cefepime no gram-negative organisms in any cultures.  Continue vancomycin for 2 weeks and then reculture.  Covid infection COVID-19 Labs  Recent Labs    10/24/19 0414 10/25/19 0400 10/26/19 0100  DDIMER 1.99* 1.39* 1.31*  FERRITIN 2,818* 2,629* 2,683*  LDH 263* 242* 218*  CRP 28.4* 17.5* 11.8*    Lab Results  Component Value Date   SARSCOV2NAA POSITIVE (A) 10/21/2019   [place] NEGATIVE 02/16/2019   SARSCOV2NAA NOT DETECTED 11/15/2018   [place] NEGATIVE 06/23/2018  -  Patient fully vaccinated against Covid 19 -Remdesivir per pharmacy protocol -10/1 Solu-Medrol 100 mg BID.  Patient CRP> 30, which  now qualifies him for steroids -Vitamin C and zinc per Covid protocol -Patient does not meet criteria Baricitinib on active chemotherapy -Given patient's: Multiple comorbidities Covid positive multiple myeloma resistant to chemotherapy, pancytopenia, will continue treatment for both bacterial as well as Covid infection. -10/2 discussed with ID Dr Carlyle Basques who concurred with current treatment plan  -Explained to patient that lack of symptoms most likely secondary to the lack of his immune system.  COVID-19 does the majority of his damage by causing an immune cascade, but since his immune system is more or less nonexistent secondary to ongoing chemotherapy cannot mount an an effective immune response.  Counseled believe this is why he is not experienced any serious pulmonary, GI etc. Covid related symptoms.  Elevated D-dimer -Patient with new onset right inner thigh pain.  Lump on palpation feels like a cord.  DVT? -10/1 lower extremity Doppler negative for DVT  Essential HTN -10/1 Metoprolol 25 mg  BID (home dose 50 mg)  -Strict in and out +2.2 L -Daily weight Filed Weights   10/23/19 1521 10/26/19 0500  Weight: 88 kg 92.5 kg    Chronic diastolic CHF grade 1 -See HTN  Multiple myeloma: -Currently getting chemo.  Received blood transfusion yesterday -Followed by oncology Dr. Marin Olp oncology outpatient. -EDP consulted oncology -Per Dr. Antonieta Pert note will start Neupogen   Pancytopenia: -In the setting of multiple myeloma -WBC 2.4, H&H: 9.4/27.2, platelet: 14 -No signs of active bleeding -See multiple myeloma. -10/30 discussed case with Dr. Marin Olp oncology do not transfuse platelets unless<10K or active signs of bleeding. -10/4-If WBCs drop below 1000 start Neupogen Lab Results  Component Value Date   WBC 1.3 (LL) 10/26/2019   WBC 1.1 (LL) 10/25/2019   WBC 1.2 (LL) 10/24/2019   WBC 1.0 (LL) 10/23/2019   WBC 1.2 (LL) 10/22/2019  -Transfuse for hemoglobin<7 -9/30  transfuse 1 unit PRBC -10/3 transfuse 1 unit PRBC  GERD: -PPI due to severe thrombocytopenia  Depression/anxiety:  -Continue Remeron, Ativan as needed  BPH: -Continue Flomax  Poor appetite:  -Marinol 2.5 mg BID  Please note:  Patient initial troponin came back elevated-253.  Patient denies ACS symptoms.  Likely demand ischemia secondary to underlying infection.  Will trend troponin and will get stat EKG.  Hypokalemia -Potassium goal> 4  Hypophosphatemia -Phosphorus goal> 2.5  Goals of care -10/1 Palliative Care; consult placed for patient who has multiple myeloma not responding to therapy, and Covid pneumonia.  Discuss goals of care and possible hospice.   DVT prophylaxis: SCD, no chemical anticoagulation due to severe thrombocytopenia Code Status: DNR Family Communication: 10/3 spoke with Lelan Pons (wife) counseled on plan of care and answered all questions  Status is: Inpatient    Dispo: The patient is from: Home              Anticipated d/c is to: Home              Anticipated d/c date is: 10/4              Patient currently unstable      Consultants:  Dr. Marin Olp oncology Phone consult  ID Dr Carlyle Basques   Procedures/Significant Events:  9/30 transfuse 1 unit PRBC 10/1 bilateral lower extremity Doppler; negative DVT 10/2 echocardiogram; Left Ventricle: LVEF= 65 to 70%.  - Grade I diastolic dysfunction (impaired relaxation).  10/2 transfuse 1 unit platelets 10/3  transfuse 1 unit PRBC   I have personally reviewed and interpreted all radiology studies and my findings are as above.  VENTILATOR SETTINGS: Room air 10/3 SPO2 94%   Cultures 9/29 SARS coronavirus positive 9/29 influenza A/B negative 9/29 urine positive staph hemolyticus 9/29 blood RIGHT AC Positive Staph Epidermidis 10/2 blood RIGHT forearm NGTD 10/2 blood  Blood PORTA cath NGTD    Antimicrobials: Anti-infectives (From admission, onward)   Start     Ordered Stop   10/22/19  1000  remdesivir 100 mg in sodium chloride 0.9 % 100 mL IVPB  Status:  Discontinued       "Followed by" Linked Group Details   10/21/19 1421 10/21/19 1426   10/22/19 1000  remdesivir 100 mg in sodium chloride 0.9 % 100 mL IVPB       "Followed by" Linked Group Details   10/21/19 1428 10/26/19 0959   10/22/19 0100  vancomycin (VANCOREADY) IVPB 750 mg/150 mL  Status:  Discontinued        10/21/19 1350 10/21/19 1422   10/21/19 1500  valACYclovir (VALTREX) tablet 1,000 mg        10/21/19 1409     10/21/19 1430  remdesivir 200 mg in sodium chloride 0.9% 250 mL IVPB  Status:  Discontinued       "Followed by" Linked Group Details   10/21/19 1421 10/21/19 1426   10/21/19 1430  remdesivir 200 mg in sodium chloride 0.9% 250 mL IVPB       "Followed by" Linked Group Details   10/21/19 1428 10/21/19 1633   10/21/19 1400  ceFEPIme (MAXIPIME) 2 g in sodium chloride 0.9 % 100 mL IVPB        10/21/19 1350     10/21/19 1345  ceFEPIme (MAXIPIME) 2 g in sodium chloride 0.9 % 100 mL IVPB  Status:  Discontinued        10/21/19 1343 10/21/19 1346   10/21/19 1345  vancomycin (VANCOCIN) IVPB 1000 mg/200 mL premix  Status:  Discontinued        10/21/19 1343 10/21/19 1346   10/21/19 1030  vancomycin (VANCOREADY) IVPB 1500 mg/300 mL  Status:  Discontinued        10/21/19 1017 10/21/19 1448   10/21/19 1015  ceFEPIme (MAXIPIME) 2 g in sodium chloride 0.9 % 100 mL IVPB        10/21/19 1000 10/21/19 1103   10/21/19 1015  metroNIDAZOLE (FLAGYL) IVPB 500 mg        10/21/19 1000 10/21/19 1247   10/21/19 1015  vancomycin (VANCOCIN) IVPB 1000 mg/200 mL premix  Status:  Discontinued        10/21/19 1000 10/21/19 1017       Devices    LINES / TUBES:      Continuous Infusions: . ceFEPime (MAXIPIME) IV 2 g (10/26/19 1427)  . vancomycin 1,250 mg (10/26/19 0920)     Objective: Vitals:   10/25/19 2146 10/26/19 0500 10/26/19 0612 10/26/19 1314  BP: 127/71  (!) 144/75 133/68  Pulse: 74  64 69  Resp: _0 Temp: 98 F (36.7 C)  97.6 F (36.4 C) 98 F (36.7 C)  TempSrc: Oral  Oral Oral  SpO2: 97%  97% 96%  Weight:  92.5 kg    Height:        Intake/Output Summary (Last 24 hours) at 10/26/2019 1429 Last data filed at 10/26/2019 1315 Gross per 24 hour  Intake 1495 ml  Output 2400 ml  Net -905  ml   Filed Weights   10/23/19 1521 10/26/19 0500  Weight: 88 kg 92.5 kg    Physical Exam:  General: A/O x4, No acute respiratory distress Eyes: negative scleral hemorrhage, negative anisocoria, negative icterus ENT: Negative Runny nose, negative gingival bleeding, Neck:  Negative scars, masses, torticollis, lymphadenopathy, JVD Lungs: Clear to auscultation bilaterally without wheezes or crackles Cardiovascular: Regular rate and rhythm without murmur gallop or rub normal S1 and S2 Abdomen: negative abdominal pain, nondistended, positive soft, bowel sounds, no rebound, no ascites, no appreciable mass Extremities: No significant cyanosis, clubbing, or edema bilateral lower extremities Skin: Negative rashes, lesions, ulcers Psychiatric:  Negative depression, negative anxiety, negative fatigue, negative mania  Central nervous system:  Cranial nerves II through XII intact, tongue/uvula midline, all extremities muscle strength 5/5, sensation intact throughout, negative dysarthria, negative expressive aphasia, negative receptive aphasia.  .     Data Reviewed: Care during the described time interval was provided by me .  I have reviewed this patient's available data, including medical history, events of note, physical examination, and all test results as part of my evaluation.  CBC: Recent Labs  Lab 10/22/19 0330 10/23/19 0251 10/24/19 0414 10/25/19 0400 10/26/19 0100  WBC 1.2* 1.0* 1.2* 1.1* 1.3*  NEUTROABS 0.9* 0.7* 0.7* 0.8* 1.0*  HGB 6.3* 7.1* 7.2* 6.3* 7.1*  HCT 18.0* 20.8* 22.2* 18.7* 20.6*  MCV 93.8 92.4 97.4 94.9 93.6  PLT 10* 11* 9* 17* 15*   Basic Metabolic Panel: Recent Labs   Lab 10/22/19 0330 10/23/19 0251 10/24/19 0414 10/25/19 0400 10/26/19 0100  NA 139 140 140 143 139  K 3.4* 3.3* 4.0 4.0 3.6  CL 106 107 107 108 106  CO2 20* 22 22 21* 23  GLUCOSE 99 99 177* 152* 157*  BUN 22 25* 28* 39* 42*  CREATININE 1.10 1.01 0.87 0.78 0.91  CALCIUM 8.9 8.3* 8.5* 8.1* 7.8*  MG 1.8 2.0 2.3 2.5* 2.3  PHOS 2.2* 2.6 2.4* 2.5 2.3*   GFR: Estimated Creatinine Clearance: 66.5 mL/min (by C-G formula based on SCr of 0.91 mg/dL). Liver Function Tests: Recent Labs  Lab 10/22/19 0330 10/23/19 0251 10/24/19 0414 10/25/19 0400 10/26/19 0100  AST 54* 67* 58* 47* 43*  ALT 19 26 32 36 42  ALKPHOS 40 46 49 44 39  BILITOT 1.3* 1.1 0.7 0.8 0.7  PROT 6.9 7.1 7.2 6.8 6.5  ALBUMIN 2.7* 2.9* 2.5* 2.4* 2.4*   No results for input(s): LIPASE, AMYLASE in the last 168 hours. No results for input(s): AMMONIA in the last 168 hours. Coagulation Profile: Recent Labs  Lab 10/21/19 1028 10/22/19 0330  INR 1.3* 1.6*   Cardiac Enzymes: No results for input(s): CKTOTAL, CKMB, CKMBINDEX, TROPONINI in the last 168 hours. BNP (last 3 results) No results for input(s): PROBNP in the last 8760 hours. HbA1C: No results for input(s): HGBA1C in the last 72 hours. CBG: No results for input(s): GLUCAP in the last 168 hours. Lipid Profile: No results for input(s): CHOL, HDL, LDLCALC, TRIG, CHOLHDL, LDLDIRECT in the last 72 hours. Thyroid Function Tests: No results for input(s): TSH, T4TOTAL, FREET4, T3FREE, THYROIDAB in the last 72 hours. Anemia Panel: Recent Labs    10/25/19 0400 10/26/19 0100  FERRITIN 2,629* 2,683*   Sepsis Labs: Recent Labs  Lab 10/21/19 1028 10/21/19 1606 10/22/19 0330  PROCALCITON  --  4.33 3.68  LATICACIDVEN 2.0*  2.0*  --   --     Recent Results (from the past 240 hour(s))  Blood Culture (routine x  2)     Status: None   Collection Time: 10/21/19 10:20 AM   Specimen: BLOOD  Result Value Ref Range Status   Specimen Description   Final    BLOOD  RIGHT ARM Performed at [facility] [address], [place], [place] [address]    Special Requests   Final    BOTTLES DRAWN AEROBIC AND ANAEROBIC Blood Culture results may not be optimal due to an inadequate volume of blood received in culture bottles Performed at [facility] [address], [place], [place] [address]    Culture   Final    NO GROWTH 5 DAYS Performed at [facility], [place] [address], [place], [place] [address]    Report Status 10/26/2019 FINAL  Final  Urine culture     Status: Abnormal   Collection Time: 10/21/19 10:28 AM   Specimen: In/Out Cath Urine  Result Value Ref Range Status   Specimen Description   Final    IN/OUT CATH URINE Performed at [facility] [address] [place], [place] [address]    Special Requests   Final    NONE Performed at [facility], [place] [address], [place], [place] [address]    Culture 3,000 COLONIES/mL STAPHYLOCOCCUS HAEMOLYTICUS (A)  Final   Report Status 10/23/2019 FINAL  Final   Organism ID, Bacteria STAPHYLOCOCCUS HAEMOLYTICUS (A)  Final      Susceptibility   Staphylococcus haemolyticus - MIC*    CIPROFLOXACIN >=8 RESISTANT Resistant     GENTAMICIN <=0.5 SENSITIVE Sensitive     NITROFURANTOIN 32 SENSITIVE Sensitive     OXACILLIN >=4 RESISTANT Resistant     TETRACYCLINE <=1 SENSITIVE Sensitive     VANCOMYCIN <=0.5 SENSITIVE Sensitive     TRIMETH/SULFA <=10 SENSITIVE Sensitive     CLINDAMYCIN RESISTANT Resistant     RIFAMPIN <=0.5 SENSITIVE Sensitive     Inducible Clindamycin POSITIVE Resistant     * 3,000 COLONIES/mL STAPHYLOCOCCUS HAEMOLYTICUS  Blood Culture (routine x 2)     Status: Abnormal   Collection Time: 10/21/19 10:30 AM   Specimen: BLOOD  Result Value Ref Range Status   Specimen Description   Final    BLOOD RIGHT ANTECUBITAL Performed at [facility] [address] [place], [place] [address]    Special  Requests   Final    BOTTLES DRAWN AEROBIC AND ANAEROBIC Blood Culture adequate volume Performed at [facility] [address] [place], [place] [address]    Culture  Setup Time   Final    GRAM POSITIVE COCCI IN BOTH AEROBIC AND ANAEROBIC BOTTLES CRITICAL RESULT CALLED TO, READ BACK BY AND VERIFIED WITH: PHARM D M.LLIASTON AT 7342 ON 10/22/2019 BY T.SAAD    Culture (A)  Final    STAPHYLOCOCCUS EPIDERMIDIS THE SIGNIFICANCE OF ISOLATING THIS ORGANISM FROM A SINGLE SET OF BLOOD CULTURES WHEN MULTIPLE SETS ARE DRAWN IS UNCERTAIN. PLEASE NOTIFY THE MICROBIOLOGY DEPARTMENT WITHIN ONE WEEK IF SPECIATION AND SENSITIVITIES ARE REQUIRED. Performed at [facility], [place] [address], [place], [place] [address]    Report Status 10/24/2019 FINAL  Final  Blood Culture ID Panel (Reflexed)     Status: Abnormal   Collection Time: 10/21/19 10:30 AM  Result Value Ref Range Status   Enterococcus faecalis NOT DETECTED NOT DETECTED Final   Enterococcus Faecium NOT DETECTED NOT DETECTED Final   Listeria monocytogenes NOT DETECTED NOT DETECTED Final   Staphylococcus species DETECTED (A) NOT DETECTED Final  Comment: CRITICAL RESULT CALLED TO, READ BACK BY AND VERIFIED WITH: PHARM D M.LLIASTON AT 0724 ON 10/22/2019 BY T.SAAD    Staphylococcus aureus (BCID) NOT DETECTED NOT DETECTED Final   Staphylococcus epidermidis DETECTED (A) NOT DETECTED Final    Comment: Methicillin (oxacillin) resistant coagulase negative staphylococcus. Possible blood culture contaminant (unless isolated from more than one blood culture draw or clinical case suggests pathogenicity). No antibiotic treatment is indicated for blood  culture contaminants. PHARM D M.LLIASTON AT 5284 ON 10/22/2019 BY T.SAAD    Staphylococcus lugdunensis NOT DETECTED NOT DETECTED Final   Streptococcus species NOT DETECTED NOT DETECTED Final   Streptococcus agalactiae NOT DETECTED NOT DETECTED Final   Streptococcus pneumoniae NOT  DETECTED NOT DETECTED Final   Streptococcus pyogenes NOT DETECTED NOT DETECTED Final   A.calcoaceticus-baumannii NOT DETECTED NOT DETECTED Final   Bacteroides fragilis NOT DETECTED NOT DETECTED Final   Enterobacterales NOT DETECTED NOT DETECTED Final   Enterobacter cloacae complex NOT DETECTED NOT DETECTED Final   Escherichia coli NOT DETECTED NOT DETECTED Final   Klebsiella aerogenes NOT DETECTED NOT DETECTED Final   Klebsiella oxytoca NOT DETECTED NOT DETECTED Final   Klebsiella pneumoniae NOT DETECTED NOT DETECTED Final   Proteus species NOT DETECTED NOT DETECTED Final   Salmonella species NOT DETECTED NOT DETECTED Final   Serratia marcescens NOT DETECTED NOT DETECTED Final   Haemophilus influenzae NOT DETECTED NOT DETECTED Final   Neisseria meningitidis NOT DETECTED NOT DETECTED Final   Pseudomonas aeruginosa NOT DETECTED NOT DETECTED Final   Stenotrophomonas maltophilia NOT DETECTED NOT DETECTED Final   Candida albicans NOT DETECTED NOT DETECTED Final   Candida auris NOT DETECTED NOT DETECTED Final   Candida glabrata NOT DETECTED NOT DETECTED Final   Candida krusei NOT DETECTED NOT DETECTED Final   Candida parapsilosis NOT DETECTED NOT DETECTED Final   Candida tropicalis NOT DETECTED NOT DETECTED Final   Cryptococcus neoformans/gattii NOT DETECTED NOT DETECTED Final   Methicillin resistance mecA/C DETECTED (A) NOT DETECTED Final    Comment: PHARM D M.LLIASTON AT 1324 ON 10/22/2019 BY T.SAAD Performed at [facility], [place] [address], [place], [place] [address]   Respiratory Panel by RT PCR (Flu A&B, Covid) - Nasopharyngeal Swab     Status: Abnormal   Collection Time: 10/21/19 12:51 PM   Specimen: Nasopharyngeal Swab  Result Value Ref Range Status   SARS Coronavirus 2 by RT PCR POSITIVE (A) NEGATIVE Final    Comment: RESULT CALLED TO, READ BACK BY AND VERIFIED WITH: CLAPP,S. RN _0  ON 09.29.2021 BY COHEN,K (NOTE) SARS-CoV-2 target nucleic acids are  DETECTED.  SARS-CoV-2 RNA is generally detectable in upper respiratory specimens  during the acute phase of infection. Positive results are indicative of the presence of the identified virus, but do not rule out bacterial infection or co-infection with other pathogens not detected by the test. Clinical correlation with patient history and other diagnostic information is necessary to determine patient infection status. The expected result is Negative.  Fact Sheet for Patients:  PinkCheek.be  Fact Sheet for Healthcare Providers: GravelBags.it  This test is not yet approved or cleared by the [place] FDA and  has been authorized for detection and/or diagnosis of SARS-CoV-2 by FDA under an Emergency Use Authorization (EUA).  This EUA will remain in effect (meaning this test  can be used) for the duration of  the COVID-19 declaration under Section 564(b)(1) of the Act, 21 U.S.C. section 360bbb-3(b)(1), unless the authorization is terminated or revoked sooner.  Influenza A by PCR NEGATIVE NEGATIVE Final   Influenza B by PCR NEGATIVE NEGATIVE Final    Comment: (NOTE) The Xpert Xpress SARS-CoV-2/FLU/RSV assay is intended as an aid in  the diagnosis of influenza from Nasopharyngeal swab specimens and  should not be used as a sole basis for treatment. Nasal washings and  aspirates are unacceptable for Xpert Xpress SARS-CoV-2/FLU/RSV  testing.  Fact Sheet for Patients: PinkCheek.be  Fact Sheet for Healthcare Providers: GravelBags.it  This test is not yet approved or cleared by the [place] FDA and  has been authorized for detection and/or diagnosis of SARS-CoV-2 by  FDA under an Emergency Use Authorization (EUA). This EUA will remain  in effect (meaning this test can be used) for the duration of the  Covid-19 declaration under Section 564(b)(1) of the Act,  21  U.S.C. section 360bbb-3(b)(1), unless the authorization is  terminated or revoked. Performed at [facility], [place] [address], [place], [place] [address]   Culture, blood (routine x 2)     Status: None (Preliminary result)   Collection Time: 10/24/19  6:37 PM   Specimen: BLOOD  Result Value Ref Range Status   Specimen Description   Final    BLOOD BLOOD RIGHT FOREARM Performed at [facility] [address] [place], [place] [address]    Special Requests   Final    BOTTLES DRAWN AEROBIC ONLY Blood Culture results may not be optimal due to an excessive volume of blood received in culture bottles   Culture   Final    NO GROWTH 2 DAYS Performed at [facility], [place] [address], [place], [place] [address]    Report Status PENDING  Incomplete  Culture, blood (routine x 2)     Status: None (Preliminary result)   Collection Time: 10/24/19  7:15 PM   Specimen: BLOOD  Result Value Ref Range Status   Specimen Description   Final    BLOOD PORTA CATH Performed at [facility] [address] [place], [place] [address]    Special Requests   Final    BOTTLES DRAWN AEROBIC ONLY Blood Culture adequate volume Performed at [facility] [address], [place], [place] [address]    Culture   Final    NO GROWTH 1 DAY Performed at [facility], [place] [address] [place], [place] [address]    Report Status PENDING  Incomplete         Radiology Studies: ECHOCARDIOGRAM LIMITED  Result Date: 10/24/2019    ECHOCARDIOGRAM LIMITED REPORT   Patient Name:   TAYSEAN WAGER Date of Exam: 10/24/2019 Medical Rec #:  [id number]       Height:       65.0 in Accession #:    [id number]      Weight:       194.0 lb Date of Birth:  01/18/1941       BSA:          1.953 m Patient Age:    50 years        BP:           122/77 mmHg Patient Gender: M               HR:           89 bpm. Exam Location:  Inpatient Procedure: Limited  Echo, Limited Color Doppler and Cardiac Doppler Indications:    Bacteremia 790.7 / R78.81  History:  Patient has prior history of Echocardiogram examinations, most                 recent 12/13/2015. Signs/Symptoms:Murmur; Risk                 Factors:Hypertension, Dyslipidemia and Former Smoker. GERD.  Sonographer:    Alvino Chapel RCS Referring Phys: [place] Comments: Covid positive. IMPRESSIONS  1. Left ventricular ejection fraction, by estimation, is 65 to 70%. The left ventricle has normal function. Left ventricular diastolic parameters are consistent with Grade I diastolic dysfunction (impaired relaxation).  2. Right ventricular systolic function was not well visualized. The right ventricular size is not well visualized.  3. The mitral valve is normal in structure. Mild mitral valve regurgitation. No evidence of mitral stenosis.  4. The aortic valve is tricuspid. There is mild calcification of the aortic valve. There is mild thickening of the aortic valve. Aortic valve regurgitation is not visualized. No aortic stenosis is present.  5. The inferior vena cava is dilated in size with >50% respiratory variability, suggesting right atrial pressure of 8 mmHg.  6. Limited echo for bacteremia. No evidence of endocarditis. FINDINGS  Left Ventricle: Left ventricular ejection fraction, by estimation, is 65 to 70%. The left ventricle has normal function. The left ventricular internal cavity size was normal in size. There is no left ventricular hypertrophy. Left ventricular diastolic parameters are consistent with Grade I diastolic dysfunction (impaired relaxation). Normal left ventricular filling pressure. Right Ventricle: The right ventricular size is not well visualized. Right vetricular wall thickness was not well visualized. Right ventricular systolic function was not well visualized. Left Atrium: Left atrial size was not well visualized. Right Atrium: Right atrial size was not well  visualized. Pericardium: There is no evidence of pericardial effusion. Mitral Valve: The mitral valve is normal in structure. There is mild thickening of the mitral valve leaflet(s). There is mild calcification of the mitral valve leaflet(s). Mild mitral annular calcification. Mild mitral valve regurgitation. No evidence of  mitral valve stenosis. Tricuspid Valve: The tricuspid valve is normal in structure. Tricuspid valve regurgitation is not demonstrated. No evidence of tricuspid stenosis. Aortic Valve: The aortic valve is tricuspid. There is mild calcification of the aortic valve. There is mild thickening of the aortic valve. There is mild aortic valve annular calcification. Aortic valve regurgitation is not visualized. No aortic stenosis  is present. Aortic valve mean gradient measures 6.4 mmHg. Aortic valve peak gradient measures 12.3 mmHg. Aortic valve area, by VTI measures 2.95 cm. Pulmonic Valve: The pulmonic valve was not well visualized. Pulmonic valve regurgitation is not visualized. No evidence of pulmonic stenosis. Aorta: The aortic root is normal in size and structure. Pulmonary Artery: Indeterminant PASP, inadequate TR jet. Venous: The inferior vena cava is dilated in size with greater than 50% respiratory variability, suggesting right atrial pressure of 8 mmHg. LEFT VENTRICLE PLAX 2D LVOT diam:     2.10 cm      Diastology LV SV:         96           LV e' medial:    8.05 cm/s LV SV Index:   49           LV E/e' medial:  10.6 LVOT Area:     3.46 cm     LV e' lateral:   9.90 cm/s  LV E/e' lateral: 8.6  LV Volumes (MOD) LV vol d, MOD A2C: 91.8 ml LV vol d, MOD A4C: 158.0 ml LV vol s, MOD A2C: 32.0 ml LV vol s, MOD A4C: 53.7 ml LV SV MOD A2C:     59.8 ml LV SV MOD A4C:     158.0 ml LV SV MOD BP:      80.1 ml AORTIC VALVE AV Area (Vmax):    2.73 cm AV Area (Vmean):   2.85 cm AV Area (VTI):     2.95 cm AV Vmax:           175.10 cm/s AV Vmean:          118.296 cm/s AV VTI:             0.325 m AV Peak Grad:      12.3 mmHg AV Mean Grad:      6.4 mmHg LVOT Vmax:         138.00 cm/s LVOT Vmean:        97.400 cm/s LVOT VTI:          0.277 m LVOT/AV VTI ratio: 0.85  AORTA Ao Root diam: 3.50 cm MITRAL VALVE MV Area (PHT): 4.80 cm     SHUNTS MV Decel Time: 158 msec     Systemic VTI:  0.28 m MR Peak grad: 106.5 mmHg    Systemic Diam: 2.10 cm MR Vmax:      516.00 cm/s MV E velocity: 85.30 cm/s MV A velocity: 111.00 cm/s MV E/A ratio:  0.77 Carlyle Dolly MD Electronically signed by Carlyle Dolly MD Signature Date/Time: 10/24/2019/3:54:13 PM    Final         Scheduled Meds: . (feeding supplement) PROSource Plus  30 mL Oral BID BM  . sodium chloride   Intravenous Once  . vitamin C  500 mg Oral Daily  . Chlorhexidine Gluconate Cloth  6 each Topical Daily  . dronabinol  2.5 mg Oral BID AC  . methylPREDNISolone (SOLU-MEDROL) injection  100 mg Intravenous Q12H  . metoprolol tartrate  25 mg Oral BID  . mirtazapine  7.5 mg Oral QHS  . multivitamin with minerals  1 tablet Oral Daily  . sodium chloride flush  10-40 mL Intracatheter Q12H  . tamsulosin  0.4 mg Oral QHS  . valACYclovir  1,000 mg Oral Daily  . zinc sulfate  220 mg Oral Daily   Continuous Infusions: . ceFEPime (MAXIPIME) IV 2 g (10/26/19 1427)  . vancomycin 1,250 mg (10/26/19 0920)     LOS: 5 days    Time spent:40 min    [place], Geraldo Docker, MD [facility] Pager [phone]  If 7PM-7AM, please contact night-coverage [email or web address] Password [email or web address] 10/26/2019, 2:29 PM

## 2019-10-26 NOTE — Care Management Important Message (Signed)
Important Message  Patient Details IM Letter given to the Patient Name: Gene Hill MRN: [id number] Date of Birth: 03-Feb-1940   Medicare Important Message Given:  Yes     Kerin Salen 10/26/2019, 12:47 PM

## 2019-10-26 NOTE — Progress Notes (Signed)
PHARMACY CONSULT NOTE FOR:  OUTPATIENT  PARENTERAL ANTIBIOTIC THERAPY (OPAT)  Indication: MRSE Bacteremia Regimen: Vancomycin 1250mg  IV q24h  End date: 11/07/2019  IV antibiotic discharge orders are pended. To discharging provider:  please sign these orders via discharge navigator,  Select New Orders & click on the button choice - Manage This Unsigned Work.     Thank you for allowing pharmacy to be a part of this patient's care.  Phillis Haggis 10/26/2019, 4:57 PM

## 2019-10-27 ENCOUNTER — Inpatient Hospital Stay: Payer: PPO

## 2019-10-27 ENCOUNTER — Other Ambulatory Visit: Payer: PPO

## 2019-10-27 ENCOUNTER — Ambulatory Visit: Payer: PPO | Admitting: Hematology & Oncology

## 2019-10-27 ENCOUNTER — Ambulatory Visit: Payer: PPO

## 2019-10-27 DIAGNOSIS — B9562 Methicillin resistant Staphylococcus aureus infection as the cause of diseases classified elsewhere: Secondary | ICD-10-CM

## 2019-10-27 LAB — PREPARE RBC (CROSSMATCH)

## 2019-10-27 LAB — FERRITIN: Ferritin: 2363 ng/mL — ABNORMAL HIGH (ref 24–336)

## 2019-10-27 LAB — COMPREHENSIVE METABOLIC PANEL
ALT: 38 U/L (ref 0–44)
AST: 34 U/L (ref 15–41)
Albumin: 2.4 g/dL — ABNORMAL LOW (ref 3.5–5.0)
Alkaline Phosphatase: 37 U/L — ABNORMAL LOW (ref 38–126)
Anion gap: 9 (ref 5–15)
BUN: 38 mg/dL — ABNORMAL HIGH (ref 8–23)
CO2: 23 mmol/L (ref 22–32)
Calcium: 8 mg/dL — ABNORMAL LOW (ref 8.9–10.3)
Chloride: 110 mmol/L (ref 98–111)
Creatinine, Ser: 0.91 mg/dL (ref 0.61–1.24)
GFR calc Af Amer: >60 mL/min (ref >60)
GFR calc non Af Amer: >60 mL/min (ref >60)
Glucose, Bld: 172 mg/dL — ABNORMAL HIGH (ref 70–99)
Potassium: 3.8 mmol/L (ref 3.5–5.1)
Sodium: 142 mmol/L (ref 135–145)
Total Bilirubin: 0.5 mg/dL (ref 0.3–1.2)
Total Protein: 6.5 g/dL (ref 6.5–8.1)

## 2019-10-27 LAB — CBC WITH DIFFERENTIAL/PLATELET
Abs Immature Granulocytes: 0.15 10*3/uL — ABNORMAL HIGH (ref 0.00–0.07)
Basophils Absolute: 0 10*3/uL (ref 0.0–0.1)
Basophils Relative: 1 %
Eosinophils Absolute: 0 10*3/uL (ref 0.0–0.5)
Eosinophils Relative: 0 %
HCT: 21 % — ABNORMAL LOW (ref 39.0–52.0)
Hemoglobin: 7 g/dL — ABNORMAL LOW (ref 13.0–17.0)
Immature Granulocytes: 10 %
Lymphocytes Relative: 3 %
Lymphs Abs: 0.1 10*3/uL — ABNORMAL LOW (ref 0.7–4.0)
MCH: 31.7 pg (ref 26.0–34.0)
MCHC: 33.3 g/dL (ref 30.0–36.0)
MCV: 95 fL (ref 80.0–100.0)
Monocytes Absolute: 0.2 10*3/uL (ref 0.1–1.0)
Monocytes Relative: 12 %
Neutro Abs: 1.1 10*3/uL — ABNORMAL LOW (ref 1.7–7.7)
Neutrophils Relative %: 74 %
Platelets: 12 10*3/uL — CL (ref 150–400)
RBC: 2.21 MIL/uL — ABNORMAL LOW (ref 4.22–5.81)
RDW: 18.6 % — ABNORMAL HIGH (ref 11.5–15.5)
WBC: 1.5 10*3/uL — ABNORMAL LOW (ref 4.0–10.5)
nRBC: 3.3 % — ABNORMAL HIGH (ref 0.0–0.2)

## 2019-10-27 LAB — D-DIMER, QUANTITATIVE: D-Dimer, Quant: 1.06 ug/mL-FEU — ABNORMAL HIGH (ref 0.00–0.50)

## 2019-10-27 LAB — MAGNESIUM: Magnesium: 2.4 mg/dL (ref 1.7–2.4)

## 2019-10-27 LAB — PHOSPHORUS: Phosphorus: 2.2 mg/dL — ABNORMAL LOW (ref 2.5–4.6)

## 2019-10-27 LAB — LACTATE DEHYDROGENASE: LDH: 219 U/L — ABNORMAL HIGH (ref 98–192)

## 2019-10-27 LAB — C-REACTIVE PROTEIN: CRP: 6.2 mg/dL — ABNORMAL HIGH (ref <1.0)

## 2019-10-27 MED ORDER — TBO-FILGRASTIM 480 MCG/0.8ML ~~LOC~~ SOSY
480.0000 ug | PREFILLED_SYRINGE | Freq: Once | SUBCUTANEOUS | Status: DC
  Filled 2019-10-27: qty 0.8

## 2019-10-27 MED ORDER — SODIUM CHLORIDE 0.9% IV SOLUTION: Freq: Once | INTRAVENOUS | Status: DC

## 2019-10-27 NOTE — Progress Notes (Signed)
PROGRESS NOTE    MONTERRIO Hill  JEH:[id number] DOB: 04/15/1940 DOA: 10/21/2019 PCP: Colon Branch, MD     Brief Narrative:    79 y.o. WM PMHx Multiple myeloma, HTN, CAD, s/p cath 1998, HLD, GERD,  lower back pain, arthritis, pancytopenia   Presents to emergency department with generalized weakness and not feeling well overall.  Patient tells me that he has not been feeling well, has fever and generalized weakness started last night. He received blood transfusion yesterday. Reports that last night he was feeling extremely weak and had a fever. He received second dose of COVID-19 vaccine on Friday and thinks that his symptoms could be related to his second shot. Reports decreased appetite as well.  He denies headache, blurry vision, lightheadedness, dizziness, cough, congestion, nausea, vomiting, abdominal pain, urinary or bowel changes. Denies melena, hematemesis, hemoptysis, epistaxis, petechiae or bruises on his body.  He has a port on right upper chest however he denies redness or swelling around the port site.  No history of smoking, alcohol, illicit drug use. His wife called EMS and reported his temperature was 100.4 in route to ED.  ED Course: Upon arrival to ED: Patient had fever of 101.4, tachycardic, tachypneic, lactic acid of 2.0, WBC of 2.4, platelet: 14, potassium: 3.0, UA positive for rare bacteria, POC COVID-19 negative. Chest x-ray shows minimal bibasilar atelectasis. Patient received IV fluid, cefepime, vancomycin, Flagyl and potassium in ED. [facility] consulted for admission for sepsis secondary to unknown cause.   Subjective: 10/5 afebrile overnight, A/O x4, negative S OB.  Negative abdominal pain.   Assessment & Plan: Covid vaccination; vaccinated second shot on Friday   Principal Problem:   Sepsis ([place]) Active Problems:   Multiple myeloma ([place])   Hyperlipidemia   Essential hypertension   GERD (gastroesophageal reflux disease)   Pancytopenia ([place])    Sepsis secondary to UTI ([place])   Coag negative Staphylococcus bacteremia    Sepsis UTI/positive staph hemolyticus (only 3K colonies but patient immunocompromised)  -On admission patient met criteria for sepsis HR> 90, RR> 20, CBC<4, pancytopenia - Respiratory panel positive for COVID-19.,  -Chest x-ray negative for pneumonia.  He is maintaining oxygen saturation on room air -Patient received IV fluid, cefepime, vancomycin and Flagyl in ED. -10/1 patient currently on cefepime -BC, UC, positive see results below Results for Gene, Hill (MRN [id number]) as of 10/23/2019 19:59  Ref. Range 10/21/2019 16:06 10/22/2019 03:30  Procalcitonin Latest Units: ng/mL 4.33 3.68  -Although procalcitonin and other inflammatory markers can be elevated secondary to Covid believe most likely patient has true bacterial infection secondary to his compromised immune system.  Positive Staph Epidermidis Bacteremia (1 bottle but patient immunocompromised) -Very concerned for patient given that he has compromised immune system, afraid this is a true infection. -Discussed case with Gene Hill, ID who will weigh in on case given patient's multiple medical problems. -Concurs with vancomycin for now -10/2 repeat blood cultures, if positive will need to remove Port-A-Cath.  Cultures NGTD -10/4 DC cefepime no gram-negative organisms in any cultures.  Continue vancomycin for 2 weeks and then reculture. -10/5 NCM Gene Hill has arranged for [facility] home health to come out to patient's house and administer vancomycin IV course of treatment.  And draw cultures at the end of his treatment. -10/5 Home health and home health RN have been requested  Covid infection COVID-19 Labs  Recent Labs    10/25/19 0400 10/26/19 0100 10/27/19 0434  DDIMER 1.39* 1.31* 1.06*  FERRITIN 2,629* 2,683*  2,363*  LDH 242* 218* 219*  CRP 17.5* 11.8* 6.2*    Lab Results  Component Value Date   SARSCOV2NAA POSITIVE (A) 10/21/2019    [place] NEGATIVE 02/16/2019   [place] NOT DETECTED 11/15/2018   [place] NEGATIVE 06/23/2018  -Patient fully vaccinated against Covid 19 -Remdesivir per pharmacy protocol -10/1 Solu-Medrol 100 mg BID.  Patient CRP> 30, which now qualifies him for steroids -Vitamin C and zinc per Covid protocol -Patient does not meet criteria Baricitinib on active chemotherapy -Given patient's: Multiple comorbidities Covid positive multiple myeloma resistant to chemotherapy, pancytopenia, will continue treatment for both bacterial as well as Covid infection. -10/2 discussed with ID Dr Carlyle Hill who concurred with current treatment plan  -Explained to patient that lack of symptoms most likely secondary to the lack of his immune system.  COVID-19 does the majority of his damage by causing an immune cascade, but since his immune system is more or less nonexistent secondary to ongoing chemotherapy cannot mount an an effective immune response.  Counseled believe this is why he is not experienced any serious pulmonary, GI etc. Covid related symptoms. -Patient will be considered contagious until 11/11/2019 and should follow the below instructions to protect himself as well as family members.  Elevated D-dimer -Patient with new onset right inner thigh pain.  Lump on palpation feels like a cord.  DVT? -10/1 lower extremity Doppler negative for DVT  Essential HTN -10/1 Metoprolol 25 mg  BID (home dose 50 mg)  -Strict in and out +56.66m -Daily weight Filed Weights   10/23/19 1521 10/26/19 0500  Weight: 88 kg 92.5 kg    Chronic diastolic CHF grade 1 -See HTN  Multiple myeloma: -Currently getting chemo.  Received blood transfusion yesterday -Followed by oncology Gene Hill outpatient. -EDP consulted oncology  Pancytopenia: -In the setting of multiple myeloma -WBC 2.4, H&H: 9.4/27.2, platelet: 14 -No signs of active bleeding -See multiple myeloma. -10/30 discussed case with Dr.  [name] do not transfuse platelets unless<10K or active signs of bleeding. -10/4-If WBCs drop below 1000 start Neupogen Lab Results  Component Value Date   WBC 1.5 (L) 10/27/2019   WBC 1.3 (LL) 10/26/2019   WBC 1.1 (LL) 10/25/2019   WBC 1.2 (LL) 10/24/2019   WBC 1.0 (LL) 10/23/2019  -Transfuse for hemoglobin<7 -9/30 transfuse 1 unit PRBC -10/3 transfuse 1 unit PRBC -10/5 transfuse 1 unit PRBC  GERD: -PPI due to severe thrombocytopenia  Depression/anxiety:  -Continue Remeron, Ativan as needed  BPH: -Continue Flomax  Poor appetite:  -Marinol 2.5 mg BID  Please note:  Patient initial troponin came back elevated-253.  Patient denies ACS symptoms.  Likely demand ischemia secondary to underlying infection.  Will trend troponin and will get stat EKG.  Hypokalemia -Potassium goal> 4  Hypophosphatemia -Phosphorus goal> 2.5  Goals of care -10/1 Palliative Care; consult placed for patient who has multiple myeloma not responding to therapy, and Covid pneumonia.  Discuss goals of care and possible hospice.   DVT prophylaxis: SCD, no chemical anticoagulation due to severe thrombocytopenia Code Status: DNR Family Communication: 10/5 spoke with MLelan Pons(wife) counseled on plan of care and answered all questions  Status is: Inpatient    Dispo: The patient is from: Home              Anticipated d/c is to: Home              Anticipated d/c date is: 10/4  Patient currently unstable      Consultants:  Dr. Marin Olp oncology Phone consult  ID Dr Carlyle Hill   Procedures/Significant Events:  9/30 transfuse 1 unit PRBC 10/1 bilateral lower extremity Doppler; negative DVT 10/2 echocardiogram; Left Ventricle: LVEF= 65 to 70%.  - Grade I diastolic dysfunction (impaired relaxation).  10/2 transfuse 1 unit platelets 10/3 transfuse 1 unit PRBC   I have personally reviewed and interpreted all radiology studies and my findings are as above.  VENTILATOR  SETTINGS: Room air 10/3 SPO2 94%   Cultures 9/29 SARS coronavirus positive 9/29 influenza A/B negative 9/29 urine positive staph hemolyticus 9/29 blood RIGHT AC Positive Staph Epidermidis 10/2 blood RIGHT forearm NGTD 10/2 blood  Blood PORTA cath NGTD    Antimicrobials: Anti-infectives (From admission, onward)   Start     Ordered Stop   10/22/19 1000  remdesivir 100 mg in sodium chloride 0.9 % 100 mL IVPB  Status:  Discontinued       "Followed by" Linked Group Details   10/21/19 1421 10/21/19 1426   10/22/19 1000  remdesivir 100 mg in sodium chloride 0.9 % 100 mL IVPB       "Followed by" Linked Group Details   10/21/19 1428 10/26/19 0959   10/22/19 0100  vancomycin (VANCOREADY) IVPB 750 mg/150 mL  Status:  Discontinued        10/21/19 1350 10/21/19 1422   10/21/19 1500  valACYclovir (VALTREX) tablet 1,000 mg        10/21/19 1409     10/21/19 1430  remdesivir 200 mg in sodium chloride 0.9% 250 mL IVPB  Status:  Discontinued       "Followed by" Linked Group Details   10/21/19 1421 10/21/19 1426   10/21/19 1430  remdesivir 200 mg in sodium chloride 0.9% 250 mL IVPB       "Followed by" Linked Group Details   10/21/19 1428 10/21/19 1633   10/21/19 1400  ceFEPIme (MAXIPIME) 2 g in sodium chloride 0.9 % 100 mL IVPB        10/21/19 1350     10/21/19 1345  ceFEPIme (MAXIPIME) 2 g in sodium chloride 0.9 % 100 mL IVPB  Status:  Discontinued        10/21/19 1343 10/21/19 1346   10/21/19 1345  vancomycin (VANCOCIN) IVPB 1000 mg/200 mL premix  Status:  Discontinued        10/21/19 1343 10/21/19 1346   10/21/19 1030  vancomycin (VANCOREADY) IVPB 1500 mg/300 mL  Status:  Discontinued        10/21/19 1017 10/21/19 1448   10/21/19 1015  ceFEPIme (MAXIPIME) 2 g in sodium chloride 0.9 % 100 mL IVPB        10/21/19 1000 10/21/19 1103   10/21/19 1015  metroNIDAZOLE (FLAGYL) IVPB 500 mg        10/21/19 1000 10/21/19 1247   10/21/19 1015  vancomycin (VANCOCIN) IVPB 1000 mg/200 mL premix   Status:  Discontinued        10/21/19 1000 10/21/19 1017       Devices    LINES / TUBES:      Continuous Infusions: . vancomycin 1,250 mg (10/26/19 0920)     Objective: Vitals:   10/26/19 1314 10/26/19 2144 10/27/19 0649 10/27/19 1202  BP: 133/68 (!) 149/77 (!) 154/81 (!) 167/81  Pulse: 69 76 76 75  Resp: 18 20 16 19   Temp: 98 F (36.7 C) 98.1 F (36.7 C) 97.9 F (36.6 C) 98.2 F (36.8 C)  TempSrc:  Oral Oral Oral Oral  SpO2: 96% 94% 94% 99%  Weight:      Height:        Intake/Output Summary (Last 24 hours) at 10/27/2019 1313 Last data filed at 10/27/2019 1213 Gross per 24 hour  Intake 1200 ml  Output 3375 ml  Net -2175 ml   Filed Weights   10/23/19 1521 10/26/19 0500  Weight: 88 kg 92.5 kg    Physical Exam:  General: A/O x4, No acute respiratory distress Eyes: negative scleral hemorrhage, negative anisocoria, negative icterus ENT: Negative Runny nose, negative gingival bleeding, Neck:  Negative scars, masses, torticollis, lymphadenopathy, JVD Lungs: Clear to auscultation bilaterally without wheezes or crackles Cardiovascular: Regular rate and rhythm without murmur gallop or rub normal S1 and S2 Abdomen: negative abdominal pain, nondistended, positive soft, bowel sounds, no rebound, no ascites, no appreciable mass Extremities: No significant cyanosis, clubbing, or edema bilateral lower extremities Skin: Negative rashes, lesions, ulcers Psychiatric:  Negative depression, negative anxiety, negative fatigue, negative mania  Central nervous system:  Cranial nerves II through XII intact, tongue/uvula midline, all extremities muscle strength 5/5, sensation intact throughout, negative dysarthria, negative expressive aphasia, negative receptive aphasia.  .     Data Reviewed: Care during the described time interval was provided by me .  I have reviewed this patient's available data, including medical history, events of note, physical examination, and all test  results as part of my evaluation.  CBC: Recent Labs  Lab 10/23/19 0251 10/24/19 0414 10/25/19 0400 10/26/19 0100 10/27/19 0434  WBC 1.0* 1.2* 1.1* 1.3* 1.5*  NEUTROABS 0.7* 0.7* 0.8* 1.0* 1.1*  HGB 7.1* 7.2* 6.3* 7.1* 7.0*  HCT 20.8* 22.2* 18.7* 20.6* 21.0*  MCV 92.4 97.4 94.9 93.6 95.0  PLT 11* 9* 17* 15* 12*   Basic Metabolic Panel: Recent Labs  Lab 10/23/19 0251 10/24/19 0414 10/25/19 0400 10/26/19 0100 10/27/19 0434  NA 140 140 143 139 142  K 3.3* 4.0 4.0 3.6 3.8  CL 107 107 108 106 110  CO2 22 22 21* 23 23  GLUCOSE 99 177* 152* 157* 172*  BUN 25* 28* 39* 42* 38*  CREATININE 1.01 0.87 0.78 0.91 0.91  CALCIUM 8.3* 8.5* 8.1* 7.8* 8.0*  MG 2.0 2.3 2.5* 2.3 2.4  PHOS 2.6 2.4* 2.5 2.3* 2.2*   GFR: Estimated Creatinine Clearance: 66.5 mL/min (by C-G formula based on SCr of 0.91 mg/dL). Liver Function Tests: Recent Labs  Lab 10/23/19 0251 10/24/19 0414 10/25/19 0400 10/26/19 0100 10/27/19 0434  AST 67* 58* 47* 43* 34  ALT 26 32 36 42 38  ALKPHOS 46 49 44 39 37*  BILITOT 1.1 0.7 0.8 0.7 0.5  PROT 7.1 7.2 6.8 6.5 6.5  ALBUMIN 2.9* 2.5* 2.4* 2.4* 2.4*   No results for input(s): LIPASE, AMYLASE in the last 168 hours. No results for input(s): AMMONIA in the last 168 hours. Coagulation Profile: Recent Labs  Lab 10/21/19 1028 10/22/19 0330  INR 1.3* 1.6*   Cardiac Enzymes: No results for input(s): CKTOTAL, CKMB, CKMBINDEX, TROPONINI in the last 168 hours. BNP (last 3 results) No results for input(s): PROBNP in the last 8760 hours. HbA1C: No results for input(s): HGBA1C in the last 72 hours. CBG: No results for input(s): GLUCAP in the last 168 hours. Lipid Profile: No results for input(s): CHOL, HDL, LDLCALC, TRIG, CHOLHDL, LDLDIRECT in the last 72 hours. Thyroid Function Tests: No results for input(s): TSH, T4TOTAL, FREET4, T3FREE, THYROIDAB in the last 72 hours. Anemia Panel: Recent Labs    10/26/19  0100 10/27/19 0434  FERRITIN 2,683* 2,363*    Sepsis Labs: Recent Labs  Lab 10/21/19 1028 10/21/19 1606 10/22/19 0330  PROCALCITON  --  4.33 3.68  LATICACIDVEN 2.0*  2.0*  --   --     Recent Results (from the past 240 hour(s))  Blood Culture (routine x 2)     Status: None   Collection Time: 10/21/19 10:20 AM   Specimen: BLOOD  Result Value Ref Range Status   Specimen Description   Final    BLOOD RIGHT ARM Performed at [facility] [address], [place], [place] [address]    Special Requests   Final    BOTTLES DRAWN AEROBIC AND ANAEROBIC Blood Culture results may not be optimal due to an inadequate volume of blood received in culture bottles Performed at [facility] [address], [place], [place] [address]    Culture   Final    NO GROWTH 5 DAYS Performed at [facility], [place] [address] [place], [place] [address]    Report Status 10/26/2019 FINAL  Final  Urine culture     Status: Abnormal   Collection Time: 10/21/19 10:28 AM   Specimen: In/Out Cath Urine  Result Value Ref Range Status   Specimen Description   Final    IN/OUT CATH URINE Performed at [facility] [address] [place], [place] [address]    Special Requests   Final    NONE Performed at [facility], [place] [address], [place], [place] [address]    Culture 3,000 COLONIES/mL STAPHYLOCOCCUS HAEMOLYTICUS (A)  Final   Report Status 10/23/2019 FINAL  Final   Organism ID, Bacteria STAPHYLOCOCCUS HAEMOLYTICUS (A)  Final      Susceptibility   Staphylococcus haemolyticus - MIC*    CIPROFLOXACIN >=8 RESISTANT Resistant     GENTAMICIN <=0.5 SENSITIVE Sensitive     NITROFURANTOIN 32 SENSITIVE Sensitive     OXACILLIN >=4 RESISTANT Resistant     TETRACYCLINE <=1 SENSITIVE Sensitive     VANCOMYCIN <=0.5 SENSITIVE Sensitive     TRIMETH/SULFA <=10 SENSITIVE Sensitive     CLINDAMYCIN RESISTANT Resistant     RIFAMPIN <=0.5 SENSITIVE Sensitive     Inducible Clindamycin  POSITIVE Resistant     * 3,000 COLONIES/mL STAPHYLOCOCCUS HAEMOLYTICUS  Blood Culture (routine x 2)     Status: Abnormal   Collection Time: 10/21/19 10:30 AM   Specimen: BLOOD  Result Value Ref Range Status   Specimen Description   Final    BLOOD RIGHT ANTECUBITAL Performed at [facility] [address], [place], [place] [address]    Special Requests   Final    BOTTLES DRAWN AEROBIC AND ANAEROBIC Blood Culture adequate volume Performed at [facility] [address], [place], [place] [address]    Culture  Setup Time   Final    GRAM POSITIVE COCCI IN BOTH AEROBIC AND ANAEROBIC BOTTLES CRITICAL RESULT CALLED TO, READ BACK BY AND VERIFIED WITH: PHARM D M.LLIASTON AT 0092 ON 10/22/2019 BY T.SAAD    Culture (A)  Final    STAPHYLOCOCCUS EPIDERMIDIS THE SIGNIFICANCE OF ISOLATING THIS ORGANISM FROM A SINGLE SET OF BLOOD CULTURES WHEN MULTIPLE SETS ARE DRAWN IS UNCERTAIN. PLEASE NOTIFY THE MICROBIOLOGY DEPARTMENT WITHIN ONE WEEK IF SPECIATION AND SENSITIVITIES ARE REQUIRED. Performed at [facility], [place] [address] [place], [place] [address]    Report Status 10/24/2019 FINAL  Final  Blood Culture ID Panel (Reflexed)  Status: Abnormal   Collection Time: 10/21/19 10:30 AM  Result Value Ref Range Status   Enterococcus faecalis NOT DETECTED NOT DETECTED Final   Enterococcus Faecium NOT DETECTED NOT DETECTED Final   Listeria monocytogenes NOT DETECTED NOT DETECTED Final   Staphylococcus species DETECTED (A) NOT DETECTED Final    Comment: CRITICAL RESULT CALLED TO, READ BACK BY AND VERIFIED WITH: PHARM D M.LLIASTON AT 0724 ON 10/22/2019 BY T.SAAD    Staphylococcus aureus (BCID) NOT DETECTED NOT DETECTED Final   Staphylococcus epidermidis DETECTED (A) NOT DETECTED Final    Comment: Methicillin (oxacillin) resistant coagulase negative staphylococcus. Possible blood culture contaminant (unless isolated from more than one blood culture draw or  clinical case suggests pathogenicity). No antibiotic treatment is indicated for blood  culture contaminants. PHARM D M.LLIASTON AT 4801 ON 10/22/2019 BY T.SAAD    Staphylococcus lugdunensis NOT DETECTED NOT DETECTED Final   Streptococcus species NOT DETECTED NOT DETECTED Final   Streptococcus agalactiae NOT DETECTED NOT DETECTED Final   Streptococcus pneumoniae NOT DETECTED NOT DETECTED Final   Streptococcus pyogenes NOT DETECTED NOT DETECTED Final   A.calcoaceticus-baumannii NOT DETECTED NOT DETECTED Final   Bacteroides fragilis NOT DETECTED NOT DETECTED Final   Enterobacterales NOT DETECTED NOT DETECTED Final   Enterobacter cloacae complex NOT DETECTED NOT DETECTED Final   Escherichia coli NOT DETECTED NOT DETECTED Final   Klebsiella aerogenes NOT DETECTED NOT DETECTED Final   Klebsiella oxytoca NOT DETECTED NOT DETECTED Final   Klebsiella pneumoniae NOT DETECTED NOT DETECTED Final   Proteus species NOT DETECTED NOT DETECTED Final   Salmonella species NOT DETECTED NOT DETECTED Final   Serratia marcescens NOT DETECTED NOT DETECTED Final   Haemophilus influenzae NOT DETECTED NOT DETECTED Final   Neisseria meningitidis NOT DETECTED NOT DETECTED Final   Pseudomonas aeruginosa NOT DETECTED NOT DETECTED Final   Stenotrophomonas maltophilia NOT DETECTED NOT DETECTED Final   Candida albicans NOT DETECTED NOT DETECTED Final   Candida auris NOT DETECTED NOT DETECTED Final   Candida glabrata NOT DETECTED NOT DETECTED Final   Candida krusei NOT DETECTED NOT DETECTED Final   Candida parapsilosis NOT DETECTED NOT DETECTED Final   Candida tropicalis NOT DETECTED NOT DETECTED Final   Cryptococcus neoformans/gattii NOT DETECTED NOT DETECTED Final   Methicillin resistance mecA/C DETECTED (A) NOT DETECTED Final    Comment: PHARM D M.LLIASTON AT 6553 ON 10/22/2019 BY T.SAAD Performed at [facility], [place] [address], [place], [place] [address]   Respiratory Panel by RT PCR (Flu A&B, Covid) -  Nasopharyngeal Swab     Status: Abnormal   Collection Time: 10/21/19 12:51 PM   Specimen: Nasopharyngeal Swab  Result Value Ref Range Status   SARS Coronavirus 2 by RT PCR POSITIVE (A) NEGATIVE Final    Comment: RESULT CALLED TO, READ BACK BY AND VERIFIED WITH: CLAPP,S. RN @1412  ON 09.29.2021 BY COHEN,K (NOTE) SARS-CoV-2 target nucleic acids are DETECTED.  SARS-CoV-2 RNA is generally detectable in upper respiratory specimens  during the acute phase of infection. Positive results are indicative of the presence of the identified virus, but do not rule out bacterial infection or co-infection with other pathogens not detected by the test. Clinical correlation with patient history and other diagnostic information is necessary to determine patient infection status. The expected result is Negative.  Fact Sheet for Patients:  PinkCheek.be  Fact Sheet for Healthcare Providers: GravelBags.it  This test is not yet approved or cleared by the [place] FDA and  has been authorized for detection and/or  diagnosis of SARS-CoV-2 by FDA under an Emergency Use Authorization (EUA).  This EUA will remain in effect (meaning this test  can be used) for the duration of  the COVID-19 declaration under Section 564(b)(1) of the Act, 21 U.S.C. section 360bbb-3(b)(1), unless the authorization is terminated or revoked sooner.      Influenza A by PCR NEGATIVE NEGATIVE Final   Influenza B by PCR NEGATIVE NEGATIVE Final    Comment: (NOTE) The Xpert Xpress SARS-CoV-2/FLU/RSV assay is intended as an aid in  the diagnosis of influenza from Nasopharyngeal swab specimens and  should not be used as a sole basis for treatment. Nasal washings and  aspirates are unacceptable for Xpert Xpress SARS-CoV-2/FLU/RSV  testing.  Fact Sheet for Patients: PinkCheek.be  Fact Sheet for Healthcare  Providers: GravelBags.it  This test is not yet approved or cleared by the [place] FDA and  has been authorized for detection and/or diagnosis of SARS-CoV-2 by  FDA under an Emergency Use Authorization (EUA). This EUA will remain  in effect (meaning this test can be used) for the duration of the  Covid-19 declaration under Section 564(b)(1) of the Act, 21  U.S.C. section 360bbb-3(b)(1), unless the authorization is  terminated or revoked. Performed at [facility], [place] [address] [place], [place] [address]   Culture, blood (routine x 2)     Status: None (Preliminary result)   Collection Time: 10/24/19  6:37 PM   Specimen: BLOOD  Result Value Ref Range Status   Specimen Description   Final    BLOOD BLOOD RIGHT FOREARM Performed at [facility] [address] [place], [place] [address]    Special Requests   Final    BOTTLES DRAWN AEROBIC ONLY Blood Culture results may not be optimal due to an excessive volume of blood received in culture bottles   Culture   Final    NO GROWTH 3 DAYS Performed at [facility], [place] [address] [place] [address], [place], [place] [address]    Report Status PENDING  Incomplete  Culture, blood (routine x 2)     Status: None (Preliminary result)   Collection Time: 10/24/19  7:15 PM   Specimen: BLOOD  Result Value Ref Range Status   Specimen Description   Final    BLOOD PORTA CATH Performed at [facility] [address], [place], [place] [address]    Special Requests   Final    BOTTLES DRAWN AEROBIC ONLY Blood Culture adequate volume Performed at [facility] [address] [place], [place] [address]    Culture   Final    NO GROWTH 2 DAYS Performed at [facility] [address], [place],  [address]    Report Status PENDING  Incomplete         Radiology Studies: No results found.      Scheduled Meds: . (feeding  supplement) PROSource Plus  30 mL Oral BID BM  . sodium chloride   Intravenous Once  . vitamin C  500 mg Oral Daily  . Chlorhexidine Gluconate Cloth  6 each Topical Daily  . dronabinol  2.5 mg Oral BID AC  . methylPREDNISolone (SOLU-MEDROL) injection  100 mg Intravenous Q12H  . metoprolol tartrate  25 mg Oral BID  . mirtazapine  7.5 mg Oral QHS  . multivitamin with minerals  1 tablet Oral Daily  . sodium chloride flush  10-40 mL Intracatheter Q12H  . tamsulosin  0.4 mg Oral QHS  .  Tbo-Filgrastim  480 mcg Subcutaneous Once  . valACYclovir  1,000 mg Oral Daily  . zinc sulfate  220 mg Oral Daily   Continuous Infusions: . vancomycin 1,250 mg (10/26/19 0920)     LOS: 6 days    Time spent:40 min    Gwenneth Whiteman, Geraldo Docker, MD [facility] Pager [phone]  If 7PM-7AM, please contact night-coverage [email or web address] Password [email or web address] 10/27/2019, 1:13 PM

## 2019-10-27 NOTE — Progress Notes (Signed)
Overall, I think Mr. Flaten is getting better.  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  He is on vancomycin for this.  His C-reactive protein is coming down.  I will know if this is reflective of the bacteremia being treated or the COVID being treated.  He just sounds better.  He will need outpatient vancomycin.  Unfortunately, he needs to be in isolation because of the Covid.  Otherwise, he would be able to come to the office for the vancomycin.  This will be really easy for him but we cannot do this secondary to the Covid.  His labs shows white cell count be 1.5.  Hemoglobin 7.  Platelet count 12,000.  His BUN is 38 creatinine 0.91.  Calcium is 8.  Albumin 2.4.  His appetite is picked up.  He is more active.  He is walking little bit.  He has had no diarrhea.  He has had no mouth sores.  He has had no rashes.  There has been no fever.  He had a echocardiogram done on Saturday.  His LVEF was 65-70%.  His valves looked okay.  There is no obvious vegetation seen on transthoracic echo.  His vital signs are temperature 97.9.  Pulse 76.  Blood pressure 154/81.  Oxygen saturations 94%.  His lungs sound clear.  Cardiac exam regular rate and rhythm with no murmurs, rubs or bruits.  Abdomen is soft.  Bowel sounds are present.  Mr. Sangalang has a Staph epi bacteremia.  He also has the [place].  He is on vancomycin.  He will need 14 days of vancomycin.  So far, the Port-A-Cath seems to be doing okay.  He has had no temperature spikes.  I would like to think that he can get the vancomycin at home.  I am sure that there are home health companies that do this.  I know that he has gotten great care.  I am thinking about transfusing him.  We will have to see what his hemoglobin is tomorrow.  I will give him 1 more dose of Neupogen.    Lattie Haw, MD  Psalm 124:8

## 2019-10-27 NOTE — TOC Progression Note (Signed)
Transition of Care [facility]) - Progression Note    Patient Details  Name: Gene Hill MRN: [id number] Date of Birth: 22-May-1940  [facility] CM/SW Contact  Purcell Mouton, RN Phone Number: 10/27/2019, 1:51 PM  Clinical Narrative:    Pt will discharge home with [facility] for [facility].   Expected Discharge Plan: Home/Self Care Barriers to Discharge: No Barriers Identified  Expected Discharge Plan and Services Expected Discharge Plan: Home/Self Care       Living arrangements for the past 2 months: Single Family Home                                       Social Determinants of Health (SDOH) Interventions    Readmission Risk Interventions No flowsheet data found.

## 2019-10-27 NOTE — Progress Notes (Signed)
Patient was transferred to room 1539 with belongings A/O x4 no distress noted.

## 2019-10-28 ENCOUNTER — Telehealth: Payer: Self-pay | Admitting: Internal Medicine

## 2019-10-28 LAB — FERRITIN: Ferritin: 2353 ng/mL — ABNORMAL HIGH (ref 24–336)

## 2019-10-28 LAB — CBC WITH DIFFERENTIAL/PLATELET
Abs Immature Granulocytes: 0.23 10*3/uL — ABNORMAL HIGH (ref 0.00–0.07)
Abs Immature Granulocytes: 0 10*3/uL (ref 0.00–0.07)
Band Neutrophils: 1 %
Basophils Absolute: 0 10*3/uL (ref 0.0–0.1)
Basophils Absolute: 0 10*3/uL (ref 0.0–0.1)
Basophils Relative: 0 %
Basophils Relative: 0 %
Blasts: 0 %
Eosinophils Absolute: 0 10*3/uL (ref 0.0–0.5)
Eosinophils Absolute: 0 10*3/uL (ref 0.0–0.5)
Eosinophils Relative: 0 %
Eosinophils Relative: 0 %
HCT: 22.1 % — ABNORMAL LOW (ref 39.0–52.0)
HCT: 22.4 % — ABNORMAL LOW (ref 39.0–52.0)
Hemoglobin: 7.3 g/dL — ABNORMAL LOW (ref 13.0–17.0)
Hemoglobin: 7.7 g/dL — ABNORMAL LOW (ref 13.0–17.0)
Immature Granulocytes: 10 %
Lymphocytes Relative: 7 %
Lymphocytes Relative: 5 %
Lymphs Abs: 0.2 10*3/uL — ABNORMAL LOW (ref 0.7–4.0)
Lymphs Abs: 0.1 10*3/uL — ABNORMAL LOW (ref 0.7–4.0)
MCH: 30.7 pg (ref 26.0–34.0)
MCH: 31.6 pg (ref 26.0–34.0)
MCHC: 33 g/dL (ref 30.0–36.0)
MCHC: 34.4 g/dL (ref 30.0–36.0)
MCV: 92.9 fL (ref 80.0–100.0)
MCV: 91.8 fL (ref 80.0–100.0)
Metamyelocytes Relative: 0 %
Monocytes Absolute: 0.2 10*3/uL (ref 0.1–1.0)
Monocytes Absolute: 0.1 10*3/uL (ref 0.1–1.0)
Monocytes Relative: 8 %
Monocytes Relative: 4 %
Myelocytes: 0 %
Neutro Abs: 1.7 10*3/uL (ref 1.7–7.7)
Neutro Abs: 2.4 10*3/uL (ref 1.7–7.7)
Neutrophils Relative %: 75 %
Neutrophils Relative %: 90 %
Other: 0 %
Platelets: 9 10*3/uL — CL (ref 150–400)
Platelets: 24 10*3/uL — CL (ref 150–400)
Promyelocytes Relative: 0 %
RBC: 2.38 MIL/uL — ABNORMAL LOW (ref 4.22–5.81)
RBC: 2.44 MIL/uL — ABNORMAL LOW (ref 4.22–5.81)
RDW: 19.4 % — ABNORMAL HIGH (ref 11.5–15.5)
RDW: 19.8 % — ABNORMAL HIGH (ref 11.5–15.5)
WBC: 2.3 10*3/uL — ABNORMAL LOW (ref 4.0–10.5)
WBC: 2.6 10*3/uL — ABNORMAL LOW (ref 4.0–10.5)
nRBC: 2.2 % — ABNORMAL HIGH (ref 0.0–0.2)
nRBC: 2 /100 WBC — ABNORMAL HIGH
nRBC: 2.7 % — ABNORMAL HIGH (ref 0.0–0.2)

## 2019-10-28 LAB — TYPE AND SCREEN
ABO/RH(D): A POS
Antibody Screen: NEGATIVE
Unit division: 0

## 2019-10-28 LAB — COMPREHENSIVE METABOLIC PANEL
ALT: 36 U/L (ref 0–44)
AST: 34 U/L (ref 15–41)
Albumin: 2.3 g/dL — ABNORMAL LOW (ref 3.5–5.0)
Alkaline Phosphatase: 36 U/L — ABNORMAL LOW (ref 38–126)
Anion gap: 9 (ref 5–15)
BUN: 31 mg/dL — ABNORMAL HIGH (ref 8–23)
CO2: 23 mmol/L (ref 22–32)
Calcium: 7.6 mg/dL — ABNORMAL LOW (ref 8.9–10.3)
Chloride: 109 mmol/L (ref 98–111)
Creatinine, Ser: 0.77 mg/dL (ref 0.61–1.24)
GFR calc non Af Amer: >60 mL/min (ref >60)
Glucose, Bld: 135 mg/dL — ABNORMAL HIGH (ref 70–99)
Potassium: 4 mmol/L (ref 3.5–5.1)
Sodium: 141 mmol/L (ref 135–145)
Total Bilirubin: 0.8 mg/dL (ref 0.3–1.2)
Total Protein: 6.3 g/dL — ABNORMAL LOW (ref 6.5–8.1)

## 2019-10-28 LAB — MAGNESIUM: Magnesium: 2.3 mg/dL (ref 1.7–2.4)

## 2019-10-28 LAB — LACTATE DEHYDROGENASE: LDH: 229 U/L — ABNORMAL HIGH (ref 98–192)

## 2019-10-28 LAB — BPAM RBC
Blood Product Expiration Date: 202110162359
ISSUE DATE / TIME: 202110052300
Unit Type and Rh: 6200

## 2019-10-28 LAB — D-DIMER, QUANTITATIVE: D-Dimer, Quant: 1.3 ug/mL-FEU — ABNORMAL HIGH (ref 0.00–0.50)

## 2019-10-28 LAB — C-REACTIVE PROTEIN: CRP: 3.4 mg/dL — ABNORMAL HIGH (ref <1.0)

## 2019-10-28 LAB — PHOSPHORUS: Phosphorus: 2.2 mg/dL — ABNORMAL LOW (ref 2.5–4.6)

## 2019-10-28 MED ORDER — HEPARIN SOD (PORK) LOCK FLUSH 100 UNIT/ML IV SOLN
500.0000 [IU] | INTRAVENOUS | Status: AC | PRN
  Administered 2019-10-28: 500 [IU]

## 2019-10-28 MED ORDER — ASCORBIC ACID 500 MG PO TABS: 500.0000 mg | ORAL_TABLET | Freq: Every day | ORAL | 0 refills | Status: AC

## 2019-10-28 MED ORDER — PREDNISONE 10 MG PO TABS: ORAL_TABLET | ORAL | 0 refills | Status: DC

## 2019-10-28 MED ORDER — ZINC SULFATE 220 (50 ZN) MG PO CAPS: 220.0000 mg | ORAL_CAPSULE | Freq: Every day | ORAL | 0 refills | Status: AC

## 2019-10-28 MED ORDER — VANCOMYCIN IV (FOR PTA / DISCHARGE USE ONLY): 1250.0000 mg | INTRAVENOUS | 0 refills | Status: AC

## 2019-10-28 MED ORDER — SODIUM CHLORIDE 0.9% IV SOLUTION: Freq: Once | INTRAVENOUS | Status: AC

## 2019-10-28 NOTE — Discharge Summary (Signed)
[facility] Discharge Summary   Patient: Gene Hill FVC:[id number]  PCP: Wanda Plump, MD  Date of admission: 10/21/2019   Date of discharge:  10/28/2019     Discharge Diagnoses:  Principal diagnosis Sepsis secondary to staph hemolyticus bacteremia as well as COVID-19 pneumonia.  Principal Problem:   Sepsis (HCC) Active Problems:   Multiple myeloma (HCC)   Hyperlipidemia   Essential hypertension   GERD (gastroesophageal reflux disease)   Pancytopenia (HCC)   Sepsis secondary to UTI (HCC)   Coag negative Staphylococcus bacteremia  Admitted From: Home Disposition:  Home   Recommendations for Outpatient Follow-up:  1. PCP: Follow-up with PCP in 1 week. 2. Follow up LABS/TEST: CBC weekly.   Follow-up Information    [facility], [facility] Follow up.   Specialty: Home Health Services Why: Will follow for home health RN. Please call the above number if you have any questions.  Contact information: [address] [place] [address] [phone]        Wanda Plump, MD. Schedule an appointment as soon as possible for a visit in 1 week(s).   Specialty: Internal Medicine Contact information: [address] [place] [address] [phone]              Diet recommendation: Cardiac diet  Activity: The patient is advised to gradually reintroduce usual activities, as tolerated  Discharge Condition: stable  Code Status: DNR   History of present illness: As per the H and P dictated on admission, "Gene Hill is a 79 y.o. male with medical history significant of multiple myeloma, hypertension, hyperlipidemia, GERD, coronary artery disease status post cath in 1998, lower back pain, arthritis, pancytopenia presents to emergency department with generalized weakness and not feeling well overall.  Patient tells me that he has not been feeling well, has fever and generalized weakness started last night. He received blood transfusion  yesterday. Reports that last night he was feeling extremely weak and had a fever. He received second dose of COVID-19 vaccine on Friday and thinks that his symptoms could be related to his second shot. Reports decreased appetite as well.  He denies headache, blurry vision, lightheadedness, dizziness, cough, congestion, nausea, vomiting, abdominal pain, urinary or bowel changes. Denies melena, hematemesis, hemoptysis, epistaxis, petechiae or bruises on his body.  He has a port on right upper chest however he denies redness or swelling around the port site.  No history of smoking, alcohol, illicit drug use. His wife called EMS and reported his temperature was 100.4 in route to ED."  Hospital Course:  Summary of his active problems in the hospital is as following. Sepsis due to staph epi bacteremia. Staph hemolyticus UTI -On admission patient met criteria for sepsis HR> 90, RR> 20, CBC<4, pancytopenia -Respiratory panel positive for COVID-19.,  -Chest x-ray negative forpneumonia. He is maintaining oxygen saturation on room air -Patient received IV fluid, cefepime, vancomycin and Flagyl in ED. Sepsis physiology resolved.  Staph Epidermidis Bacteremia (1 bottle but patient immunocompromised) 1 out of 4 culture positive. ID consulted. Given patient's immunocompromise status patient will be treated with IV vancomycin for 2 weeks. Repeat cultures on 10/24/2019 no growth. Port-A-Cath was left in place because of this. Home health for home antibiotics arranged with vancomycin.  Covid infection -Patient fully vaccinated against Covid 19 -Patient was given Remdesivir per pharmacy protocol -Treated with Solu-Medrol. Will be on oral prednisone taper. -Vitamin C and zinc per Covid protocol -Patient does not meet criteria  Baricitinib on active chemotherapy  Elevated D-dimer -Patient with new onset right inner thigh -10/1 lower extremity Doppler negative for DVT  Essential HTN -10/1  Metoprolol 25 mg  BID (home dose 50 mg)   Chronic diastolic CHF grade 1 -See HTN, volume stable. No volume overload.  Multiple myeloma: -Currently getting chemo.Received blood transfusion -Followed by oncology Dr. Marin Olp oncology outpatient. -EDP consulted oncology  Pancytopenia: -In the setting of multiple myeloma -No signs of active bleeding -See multiple myeloma. - Patient received platelet transfusion based on recommendation from oncology. Repeat platelet 24.  GERD: -PPI due to severe thrombocytopenia  Depression/anxiety:  -Continue Remeron, Ativan as needed  BPH: -Continue Flomax  Poor appetite:  -Marinol 2.5 mg BID  Elevated troponin. Demand ischemia. Patient initial troponin came back elevated-253. Patient denies ACS symptoms. Likely demand ischemia secondary to underlying infection.   Hypokalemia Hypophosphatemia Corrected.  Goals of care -10/1 Palliative Care; consult placed for patient who has multiple myeloma not responding to therapy, and Covid pneumonia. Prior provider Discuss goals of care and possible hospice.  Obesity At risk for poor outcomes from COVID-19 pneumonia. Body mass index is 35.04 kg/m.  Nutrition Problem: Increased nutrient needs Etiology: acute illness, catabolic illness (URKYH-06 infection) Nutrition Interventions: Interventions: MVI, Magic cup, Other (Comment), Hormel Shake (prosource plus)   Bilateral leg deep tissue injury without any pressure ulcers present on admission.  Pressure Injury 10/23/19 Other (Comment) Right;Left Deep Tissue Pressure Injury - Purple or maroon localized area of discolored intact skin or blood-filled blister due to damage of underlying soft tissue from pressure and/or shear. intact, black (Active)  10/23/19 1900  Location: Other (Comment)  Location Orientation: Right;Left  Staging: Deep Tissue Pressure Injury - Purple or maroon localized area of discolored intact skin or blood-filled blister  due to damage of underlying soft tissue from pressure and/or shear.  Wound Description (Comments): intact, black  Present on Admission: Yes   Patient was seen by physical therapy, who recommended no therapy needed on discharge. On the day of the discharge the patient's vitals were stable, and no other acute medical condition were reported by patient. The patient was felt safe to be discharge at Home with no therapy needed on discharge.  Consultants: Oncology  Procedures: 1 PRBC transfusion, 1 platelet transfusion  Discharge Exam: General: Appear in no distress, no Rash; Oral Mucosa Clear, moist. no Abnormal Neck Mass Or lumps, Conjunctiva normal  Cardiovascular: S1 and S2 Present, no Murmur Respiratory: good respiratory effort, Bilateral Air entry present and CTA, no Crackles, no wheezes Abdomen: Bowel Sound present, Soft and no tenderness Extremities: no Pedal edema Neurology: alert and oriented to time, place, and person affect appropriate. no new focal deficit  Filed Weights   10/23/19 1521 10/26/19 0500 10/28/19 0500  Weight: 88 kg 92.5 kg 95.5 kg   Vitals:   10/28/19 1346 10/28/19 1515  BP: (!) 148/76 133/71  Pulse: 73 74  Resp: (!) 21 20  Temp: 98.2 F (36.8 C) 98.1 F (36.7 C)  SpO2: 97% 97%    DISCHARGE MEDICATION: Allergies as of 10/28/2019   No Known Allergies     Medication List    STOP taking these medications   aspirin 325 MG tablet     TAKE these medications   acetaminophen 500 MG tablet Commonly known as: TYLENOL Take 1,000 mg by mouth 3 (three) times daily as needed (pain).   ascorbic acid 500 MG tablet Commonly known as: VITAMIN C Take 1 tablet (500 mg total) by mouth daily.  Start taking on: October 29, 2019   b complex vitamins tablet Take 1 tablet by mouth daily.   calcium carbonate 1500 (600 Ca) MG Tabs tablet Commonly known as: OSCAL Take 600 mg of elemental calcium by mouth daily with breakfast.   dronabinol 2.5 MG capsule Commonly  known as: MARINOL TAKE 1 CAPSULE(2.5 MG) BY MOUTH TWICE DAILY BEFORE A MEAL What changed:   how much to take  how to take this  when to take this  additional instructions   famciclovir 250 MG tablet Commonly known as: FAMVIR Take 1 tablet (250 mg total) by mouth daily.   Fish Oil 1200 MG Caps Take 1,200 mg by mouth daily.   LORazepam 0.5 MG tablet Commonly known as: ATIVAN Take 0.5 mg by mouth every 6 (six) hours as needed (nausea/vomiting).   methocarbamol 500 MG tablet Commonly known as: ROBAXIN Take 1 tablet (500 mg total) by mouth 3 (three) times daily as needed for muscle spasms.   metoprolol tartrate 50 MG tablet Commonly known as: LOPRESSOR Take 1 tablet (50 mg total) by mouth 2 (two) times daily.   mirtazapine 7.5 MG tablet Commonly known as: REMERON Take 1 tablet (7.5 mg total) by mouth at bedtime.   multivitamin tablet Take 1 tablet by mouth daily.   ondansetron 8 MG tablet Commonly known as: ZOFRAN Take 8 mg by mouth every 8 (eight) hours as needed for nausea or vomiting.   OVER THE COUNTER MEDICATION Take 2.5 mg by mouth daily. Marijuana (Appetite stimulant)   pantoprazole 40 MG tablet Commonly known as: PROTONIX Take 1 tablet (40 mg total) by mouth at bedtime.   predniSONE 10 MG tablet Commonly known as: DELTASONE Take $RemoveBefore'50mg'XxAQEAmUQTItv$  daily for 3days,Take $RemoveBefore'40mg'cKTAzjNjXgdDG$  daily for 3days,Take $RemoveBefore'30mg'jgnGdxBMDePKa$  daily for 3days,Take $RemoveBefore'20mg'YwHAqXVLiiHCS$  daily for 3days,Take $RemoveBefore'10mg'OsPuFsqWWiGZj$  daily for 3days, then stop   PROBIOTIC PO Take 1 tablet by mouth daily.   tamsulosin 0.4 MG Caps capsule Commonly known as: FLOMAX Take 0.4 mg by mouth at bedtime.   traMADol 50 MG tablet Commonly known as: ULTRAM Take 1 tablet (50 mg total) by mouth every 6 (six) hours as needed. 90 day supply please   vancomycin  IVPB Inject 1,250 mg into the vein daily for 12 days. Indication:  MRSE Bacteremia  First Dose: No Last Day of Therapy:  11/07/2019 Labs - $Remo"Sunday/Monday:  CBC/D, BMP, and vancomycin trough. Labs -  Thursday:  BMP and vancomycin trough Labs - Every other week:  ESR and CRP Method of administration:Elastomeric Method of administration may be changed at the discretion of the patient and/or caregiver's ability to self-administer the medication ordered.   VITAMIN B-12 PO Take 5,000 mcg by mouth daily.   zinc sulfate 220 (50 Zn) MG capsule Take 1 capsule (220 mg total) by mouth daily. Start taking on: October 29, 2019            Discharge Care Instructions  (From admission, onward)         Start     Ordered   10/28/19 0000  Change dressing on IV access line weekly and PRN  (Home infusion instructions - [facility] )        10"edvCB$ /06/21 1521         No Known Allergies Discharge Instructions    [facility] pharmacist to adjust dose for Vancomycin, Aminoglycosides and other anti-infective therapies as requested by physician.   Complete by: As directed    [facility] to provide Cath Flo $Remove'2mg'gxjkuGb$   Complete by: As directed    Administer for PICC line occlusion and as ordered by physician for other access device issues.   Anaphylaxis Kit: Provided to treat any anaphylactic reaction to the medication being provided to the patient if First Dose or when requested by physician   Complete by: As directed    Epinephrine 1mg /ml vial / amp: Administer 0.3mg  (0.66ml) subcutaneously once for moderate to severe anaphylaxis, nurse to call physician and pharmacy when reaction occurs and call 911 if needed for immediate care   Diphenhydramine 50mg /ml IV vial: Administer 25-50mg  IV/IM PRN for first dose reaction, rash, itching, mild reaction, nurse to call physician and pharmacy when reaction occurs   Sodium Chloride 0.9% NS 54ml IV: Administer if needed for hypovolemic blood pressure drop or as ordered by physician after call to physician with anaphylactic reaction   Change dressing on IV access line weekly and PRN   Complete by: As directed    Diet - low sodium heart  healthy   Complete by: As directed    Flush IV access with Sodium Chloride 0.9% and Heparin 10 units/ml or 100 units/ml   Complete by: As directed    Home infusion instructions - [facility]   Complete by: As directed    Instructions: Flush IV access with Sodium Chloride 0.9% and Heparin 10units/ml or 100units/ml   Change dressing on IV access line: Weekly and PRN   Instructions Cath Flo 2mg : Administer for PICC Line occlusion and as ordered by physician for other access device   [facility] pharmacist to adjust dose for: Vancomycin, Aminoglycosides and other anti-infective therapies as requested by physician   Increase activity slowly   Complete by: As directed    Method of administration may be changed at the discretion of home infusion pharmacist based upon assessment of the patient and/or caregiver's ability to self-administer the medication ordered   Complete by: As directed    No wound care   Complete by: As directed       The results of significant diagnostics from this hospitalization (including imaging, microbiology, ancillary and laboratory) are listed below for reference.    Significant Diagnostic Studies: DG Chest Port 1 View  Result Date: 10/21/2019 CLINICAL DATA:  Fever, generalized weakness EXAM: PORTABLE CHEST 1 VIEW COMPARISON:  11/18/2015 FINDINGS: Right-sided chest port terminates at the level of the distal SVC. Heart size is normal. Atherosclerotic calcification of the aortic knob. Minimal linear bibasilar atelectasis. No focal airspace consolidation, pleural effusion, or pneumothorax. Bilateral reverse shoulder arthroplasties. IMPRESSION: Minimal bibasilar atelectasis. Electronically Signed   By: Davina Poke D.O.   On: 10/21/2019 10:22   VAS [place] LOWER EXTREMITY VENOUS (DVT)  Result Date: 10/24/2019  Lower Venous DVT Study Indications: Elevated Ddimer.  Risk Factors: COVID 19 positive. Limitations: Poor ultrasound/tissue interface. Comparison  Study: No prior studies. Performing Technologist: Oliver Hum RVT  Examination Guidelines: A complete evaluation includes B-mode imaging, spectral Doppler, color Doppler, and power Doppler as needed of all accessible portions of each vessel. Bilateral testing is considered an integral part of a complete examination. Limited examinations for reoccurring indications may be performed as noted. The reflux portion of the exam is performed with the patient in reverse Trendelenburg.  +---------+---------------+---------+-----------+----------+--------------+ RIGHT    CompressibilityPhasicitySpontaneityPropertiesThrombus Aging +---------+---------------+---------+-----------+----------+--------------+ CFV      Full           Yes      Yes                                 +---------+---------------+---------+-----------+----------+--------------+  SFJ      Full                                                        +---------+---------------+---------+-----------+----------+--------------+ FV Prox  Full                                                        +---------+---------------+---------+-----------+----------+--------------+ FV Mid   Full                                                        +---------+---------------+---------+-----------+----------+--------------+ FV DistalFull                                                        +---------+---------------+---------+-----------+----------+--------------+ PFV      Full                                                        +---------+---------------+---------+-----------+----------+--------------+ POP      Full           Yes      Yes                                 +---------+---------------+---------+-----------+----------+--------------+ PTV      Full                                                        +---------+---------------+---------+-----------+----------+--------------+ PERO     Full                                                         +---------+---------------+---------+-----------+----------+--------------+   +---------+---------------+---------+-----------+----------+--------------+ LEFT     CompressibilityPhasicitySpontaneityPropertiesThrombus Aging +---------+---------------+---------+-----------+----------+--------------+ CFV      Full           Yes      Yes                                 +---------+---------------+---------+-----------+----------+--------------+ SFJ      Full                                                        +---------+---------------+---------+-----------+----------+--------------+  FV Prox  Full                                                        +---------+---------------+---------+-----------+----------+--------------+ FV Mid   Full                                                        +---------+---------------+---------+-----------+----------+--------------+ FV DistalFull                                                        +---------+---------------+---------+-----------+----------+--------------+ PFV      Full                                                        +---------+---------------+---------+-----------+----------+--------------+ POP      Full           Yes      Yes                                 +---------+---------------+---------+-----------+----------+--------------+ PTV      Full                                                        +---------+---------------+---------+-----------+----------+--------------+ PERO     Full                                                        +---------+---------------+---------+-----------+----------+--------------+     Summary: RIGHT: - There is no evidence of deep vein thrombosis in the lower extremity.  - No cystic structure found in the popliteal fossa.  LEFT: - There is no evidence of deep vein thrombosis in the lower  extremity.  - No cystic structure found in the popliteal fossa.  *See table(s) above for measurements and observations. Electronically signed by Harold Barban MD on 10/24/2019 at 1:07:18 PM.    Final    ECHOCARDIOGRAM LIMITED  Result Date: 10/24/2019    ECHOCARDIOGRAM LIMITED REPORT   Patient Name:   JOHNNELL LIOU Date of Exam: 10/24/2019 Medical Rec #:  [id number]       Height:       65.0 in Accession #:    [id number]      Weight:       194.0 lb Date of Birth:  04-25-1940       BSA:          1.953 m Patient Age:    71 years  BP:           122/77 mmHg Patient Gender: M               HR:           89 bpm. Exam Location:  Inpatient Procedure: Limited Echo, Limited Color Doppler and Cardiac Doppler Indications:    Bacteremia 790.7 / R78.81  History:        Patient has prior history of Echocardiogram examinations, most                 recent 12/13/2015. Signs/Symptoms:Murmur; Risk                 Factors:Hypertension, Dyslipidemia and Former Smoker. GERD.  Sonographer:    Alvino Chapel RCS Referring Phys: [place] Comments: Covid positive. IMPRESSIONS  1. Left ventricular ejection fraction, by estimation, is 65 to 70%. The left ventricle has normal function. Left ventricular diastolic parameters are consistent with Grade I diastolic dysfunction (impaired relaxation).  2. Right ventricular systolic function was not well visualized. The right ventricular size is not well visualized.  3. The mitral valve is normal in structure. Mild mitral valve regurgitation. No evidence of mitral stenosis.  4. The aortic valve is tricuspid. There is mild calcification of the aortic valve. There is mild thickening of the aortic valve. Aortic valve regurgitation is not visualized. No aortic stenosis is present.  5. The inferior vena cava is dilated in size with >50% respiratory variability, suggesting right atrial pressure of 8 mmHg.  6. Limited echo for bacteremia. No evidence of endocarditis. FINDINGS   Left Ventricle: Left ventricular ejection fraction, by estimation, is 65 to 70%. The left ventricle has normal function. The left ventricular internal cavity size was normal in size. There is no left ventricular hypertrophy. Left ventricular diastolic parameters are consistent with Grade I diastolic dysfunction (impaired relaxation). Normal left ventricular filling pressure. Right Ventricle: The right ventricular size is not well visualized. Right vetricular wall thickness was not well visualized. Right ventricular systolic function was not well visualized. Left Atrium: Left atrial size was not well visualized. Right Atrium: Right atrial size was not well visualized. Pericardium: There is no evidence of pericardial effusion. Mitral Valve: The mitral valve is normal in structure. There is mild thickening of the mitral valve leaflet(s). There is mild calcification of the mitral valve leaflet(s). Mild mitral annular calcification. Mild mitral valve regurgitation. No evidence of  mitral valve stenosis. Tricuspid Valve: The tricuspid valve is normal in structure. Tricuspid valve regurgitation is not demonstrated. No evidence of tricuspid stenosis. Aortic Valve: The aortic valve is tricuspid. There is mild calcification of the aortic valve. There is mild thickening of the aortic valve. There is mild aortic valve annular calcification. Aortic valve regurgitation is not visualized. No aortic stenosis  is present. Aortic valve mean gradient measures 6.4 mmHg. Aortic valve peak gradient measures 12.3 mmHg. Aortic valve area, by VTI measures 2.95 cm. Pulmonic Valve: The pulmonic valve was not well visualized. Pulmonic valve regurgitation is not visualized. No evidence of pulmonic stenosis. Aorta: The aortic root is normal in size and structure. Pulmonary Artery: Indeterminant PASP, inadequate TR jet. Venous: The inferior vena cava is dilated in size with greater than 50% respiratory variability, suggesting right atrial  pressure of 8 mmHg. LEFT VENTRICLE PLAX 2D LVOT diam:     2.10 cm      Diastology LV SV:         96  LV e' medial:    8.05 cm/s LV SV Index:   49           LV E/e' medial:  10.6 LVOT Area:     3.46 cm     LV e' lateral:   9.90 cm/s                             LV E/e' lateral: 8.6  LV Volumes (MOD) LV vol d, MOD A2C: 91.8 ml LV vol d, MOD A4C: 158.0 ml LV vol s, MOD A2C: 32.0 ml LV vol s, MOD A4C: 53.7 ml LV SV MOD A2C:     59.8 ml LV SV MOD A4C:     158.0 ml LV SV MOD BP:      80.1 ml AORTIC VALVE AV Area (Vmax):    2.73 cm AV Area (Vmean):   2.85 cm AV Area (VTI):     2.95 cm AV Vmax:           175.10 cm/s AV Vmean:          118.296 cm/s AV VTI:            0.325 m AV Peak Grad:      12.3 mmHg AV Mean Grad:      6.4 mmHg LVOT Vmax:         138.00 cm/s LVOT Vmean:        97.400 cm/s LVOT VTI:          0.277 m LVOT/AV VTI ratio: 0.85  AORTA Ao Root diam: 3.50 cm MITRAL VALVE MV Area (PHT): 4.80 cm     SHUNTS MV Decel Time: 158 msec     Systemic VTI:  0.28 m MR Peak grad: 106.5 mmHg    Systemic Diam: 2.10 cm MR Vmax:      516.00 cm/s MV E velocity: 85.30 cm/s MV A velocity: 111.00 cm/s MV E/A ratio:  0.77 Carlyle Dolly MD Electronically signed by Carlyle Dolly MD Signature Date/Time: 10/24/2019/3:54:13 PM    Final     Microbiology: Recent Results (from the past 240 hour(s))  Blood Culture (routine x 2)     Status: None   Collection Time: 10/21/19 10:20 AM   Specimen: BLOOD  Result Value Ref Range Status   Specimen Description   Final    BLOOD RIGHT ARM Performed at [facility], [place] [address] [place], [place] [address]    Special Requests   Final    BOTTLES DRAWN AEROBIC AND ANAEROBIC Blood Culture results may not be optimal due to an inadequate volume of blood received in culture bottles Performed at [facility] [address], [place], [place] [address]    Culture   Final    NO GROWTH 5 DAYS Performed at [facility], [place] [address], [place], [place] [address]    Report Status 10/26/2019 FINAL  Final  Urine culture     Status: Abnormal   Collection Time: 10/21/19 10:28 AM   Specimen: In/Out Cath Urine  Result Value Ref Range Status   Specimen Description   Final    IN/OUT CATH URINE Performed at [facility] [address] [place], [place] [address]    Special Requests   Final    NONE Performed at [facility], [place] [address] [place], [place] [address]    Culture 3,000 COLONIES/mL STAPHYLOCOCCUS HAEMOLYTICUS (A)  Final  Report Status 10/23/2019 FINAL  Final   Organism ID, Bacteria STAPHYLOCOCCUS HAEMOLYTICUS (A)  Final      Susceptibility   Staphylococcus haemolyticus - MIC*    CIPROFLOXACIN >=8 RESISTANT Resistant     GENTAMICIN <=0.5 SENSITIVE Sensitive     NITROFURANTOIN 32 SENSITIVE Sensitive     OXACILLIN >=4 RESISTANT Resistant     TETRACYCLINE <=1 SENSITIVE Sensitive     VANCOMYCIN <=0.5 SENSITIVE Sensitive     TRIMETH/SULFA <=10 SENSITIVE Sensitive     CLINDAMYCIN RESISTANT Resistant     RIFAMPIN <=0.5 SENSITIVE Sensitive     Inducible Clindamycin POSITIVE Resistant     * 3,000 COLONIES/mL STAPHYLOCOCCUS HAEMOLYTICUS  Blood Culture (routine x 2)     Status: Abnormal   Collection Time: 10/21/19 10:30 AM   Specimen: BLOOD  Result Value Ref Range Status   Specimen Description   Final    BLOOD RIGHT ANTECUBITAL Performed at [facility], [address] [place], [place] [address]    Special Requests   Final    BOTTLES DRAWN AEROBIC AND ANAEROBIC Blood Culture adequate volume Performed at [facility], [address], [place], [place] [address]    Culture  Setup Time   Final    GRAM POSITIVE COCCI IN BOTH AEROBIC AND ANAEROBIC BOTTLES CRITICAL RESULT CALLED TO, READ BACK BY AND VERIFIED WITH: PHARM D M.LLIASTON AT 0724 ON 10/22/2019 BY T.SAAD    Culture (A)  Final    STAPHYLOCOCCUS EPIDERMIDIS THE SIGNIFICANCE OF  ISOLATING THIS ORGANISM FROM A SINGLE SET OF BLOOD CULTURES WHEN MULTIPLE SETS ARE DRAWN IS UNCERTAIN. PLEASE NOTIFY THE MICROBIOLOGY DEPARTMENT WITHIN ONE WEEK IF SPECIATION AND SENSITIVITIES ARE REQUIRED. Performed at [facility], [address], [place], [place] [address]    Report Status 10/24/2019 FINAL  Final  Blood Culture ID Panel (Reflexed)     Status: Abnormal   Collection Time: 10/21/19 10:30 AM  Result Value Ref Range Status   Enterococcus faecalis NOT DETECTED NOT DETECTED Final   Enterococcus Faecium NOT DETECTED NOT DETECTED Final   Listeria monocytogenes NOT DETECTED NOT DETECTED Final   Staphylococcus species DETECTED (A) NOT DETECTED Final    Comment: CRITICAL RESULT CALLED TO, READ BACK BY AND VERIFIED WITH: PHARM D M.LLIASTON AT 0724 ON 10/22/2019 BY T.SAAD    Staphylococcus aureus (BCID) NOT DETECTED NOT DETECTED Final   Staphylococcus epidermidis DETECTED (A) NOT DETECTED Final    Comment: Methicillin (oxacillin) resistant coagulase negative staphylococcus. Possible blood culture contaminant (unless isolated from more than one blood culture draw or clinical case suggests pathogenicity). No antibiotic treatment is indicated for blood  culture contaminants. PHARM D M.LLIASTON AT 0724 ON 10/22/2019 BY T.SAAD    Staphylococcus lugdunensis NOT DETECTED NOT DETECTED Final   Streptococcus species NOT DETECTED NOT DETECTED Final   Streptococcus agalactiae NOT DETECTED NOT DETECTED Final   Streptococcus pneumoniae NOT DETECTED NOT DETECTED Final   Streptococcus pyogenes NOT DETECTED NOT DETECTED Final   A.calcoaceticus-baumannii NOT DETECTED NOT DETECTED Final   Bacteroides fragilis NOT DETECTED NOT DETECTED Final   Enterobacterales NOT DETECTED NOT DETECTED Final   Enterobacter cloacae complex NOT DETECTED NOT DETECTED Final   Escherichia coli NOT DETECTED NOT DETECTED Final   Klebsiella aerogenes NOT DETECTED NOT DETECTED Final   Klebsiella oxytoca NOT DETECTED  NOT DETECTED Final   Klebsiella pneumoniae NOT DETECTED NOT DETECTED Final   Proteus species NOT DETECTED NOT DETECTED Final   Salmonella species NOT DETECTED NOT DETECTED Final  Serratia marcescens NOT DETECTED NOT DETECTED Final   Haemophilus influenzae NOT DETECTED NOT DETECTED Final   Neisseria meningitidis NOT DETECTED NOT DETECTED Final   Pseudomonas aeruginosa NOT DETECTED NOT DETECTED Final   Stenotrophomonas maltophilia NOT DETECTED NOT DETECTED Final   Candida albicans NOT DETECTED NOT DETECTED Final   Candida auris NOT DETECTED NOT DETECTED Final   Candida glabrata NOT DETECTED NOT DETECTED Final   Candida krusei NOT DETECTED NOT DETECTED Final   Candida parapsilosis NOT DETECTED NOT DETECTED Final   Candida tropicalis NOT DETECTED NOT DETECTED Final   Cryptococcus neoformans/gattii NOT DETECTED NOT DETECTED Final   Methicillin resistance mecA/C DETECTED (A) NOT DETECTED Final    Comment: PHARM D M.LLIASTON AT 8144 ON 10/22/2019 BY T.SAAD Performed at [facility], [place] [address], [place], [place] [address]   Respiratory Panel by RT PCR (Flu A&B, Covid) - Nasopharyngeal Swab     Status: Abnormal   Collection Time: 10/21/19 12:51 PM   Specimen: Nasopharyngeal Swab  Result Value Ref Range Status   SARS Coronavirus 2 by RT PCR POSITIVE (A) NEGATIVE Final    Comment: RESULT CALLED TO, READ BACK BY AND VERIFIED WITH: CLAPP,S. RN $RemoveBef'@1412'[id number]$  ON 09.29.2021 BY COHEN,K (NOTE) SARS-CoV-2 target nucleic acids are DETECTED.  SARS-CoV-2 RNA is generally detectable in upper respiratory specimens  during the acute phase of infection. Positive results are indicative of the presence of the identified virus, but do not rule out bacterial infection or co-infection with other pathogens not detected by the test. Clinical correlation with patient history and other diagnostic information is necessary to determine patient infection status. The expected result is Negative.  Fact Sheet for  Patients:  PinkCheek.be  Fact Sheet for Healthcare Providers: GravelBags.it  This test is not yet approved or cleared by the [place] FDA and  has been authorized for detection and/or diagnosis of SARS-CoV-2 by FDA under an Emergency Use Authorization (EUA).  This EUA will remain in effect (meaning this test  can be used) for the duration of  the COVID-19 declaration under Section 564(b)(1) of the Act, 21 U.S.C. section 360bbb-3(b)(1), unless the authorization is terminated or revoked sooner.      Influenza A by PCR NEGATIVE NEGATIVE Final   Influenza B by PCR NEGATIVE NEGATIVE Final    Comment: (NOTE) The Xpert Xpress SARS-CoV-2/FLU/RSV assay is intended as an aid in  the diagnosis of influenza from Nasopharyngeal swab specimens and  should not be used as a sole basis for treatment. Nasal washings and  aspirates are unacceptable for Xpert Xpress SARS-CoV-2/FLU/RSV  testing.  Fact Sheet for Patients: PinkCheek.be  Fact Sheet for Healthcare Providers: GravelBags.it  This test is not yet approved or cleared by the [place] FDA and  has been authorized for detection and/or diagnosis of SARS-CoV-2 by  FDA under an Emergency Use Authorization (EUA). This EUA will remain  in effect (meaning this test can be used) for the duration of the  Covid-19 declaration under Section 564(b)(1) of the Act, 21  U.S.C. section 360bbb-3(b)(1), unless the authorization is  terminated or revoked. Performed at [facility], [place] [address], [place], [place] [address]   Culture, blood (routine x 2)     Status: None (Preliminary result)   Collection Time: 10/24/19  6:37 PM   Specimen: BLOOD  Result Value Ref Range Status   Specimen Description   Final    BLOOD BLOOD RIGHT FOREARM Performed at [facility] [address]  [place], [place] [address]    Special Requests   Final    BOTTLES DRAWN AEROBIC ONLY Blood Culture results may not be optimal due to an excessive volume of blood received in culture bottles   Culture   Final    NO GROWTH 4 DAYS Performed at [facility], [place] [address], [place], [place] [address]    Report Status PENDING  Incomplete  Culture, blood (routine x 2)     Status: None (Preliminary result)   Collection Time: 10/24/19  7:15 PM   Specimen: BLOOD  Result Value Ref Range Status   Specimen Description   Final    BLOOD PORTA CATH Performed at [facility] [address], [place], [place] [address]    Special Requests   Final    BOTTLES DRAWN AEROBIC ONLY Blood Culture adequate volume Performed at [facility] [address], [place], [place] [address]    Culture   Final    NO GROWTH 3 DAYS Performed at [facility], [place] [address], [place], [place] [address]    Report Status PENDING  Incomplete     Labs: CBC: Recent Labs  Lab 10/24/19 0414 10/25/19 0400 10/26/19 0100 10/27/19 0434 10/28/19 0330  WBC 1.2* 1.1* 1.3* 1.5* 2.3*  NEUTROABS 0.7* 0.8* 1.0* 1.1* 1.7  HGB 7.2* 6.3* 7.1* 7.0* 7.3*  HCT 22.2* 18.7* 20.6* 21.0* 22.1*  MCV 97.4 94.9 93.6 95.0 92.9  PLT 9* 17* 15* 12* 9*   Basic Metabolic Panel: Recent Labs  Lab 10/24/19 0414 10/25/19 0400 10/26/19 0100 10/27/19 0434 10/28/19 0330  NA 140 143 139 142 141  K 4.0 4.0 3.6 3.8 4.0  CL 107 108 106 110 109  CO2 22 21* $Remo'23 23 23  'EBCUu$ GLUCOSE 177* 152* 157* 172* 135*  BUN 28* 39* 42* 38* 31*  CREATININE 0.87 0.78 0.91 0.91 0.77  CALCIUM 8.5* 8.1* 7.8* 8.0* 7.6*  MG 2.3 2.5* 2.3 2.4 2.3  PHOS 2.4* 2.5 2.3* 2.2* 2.2*   Liver Function Tests: Recent Labs  Lab 10/24/19 0414 10/25/19 0400 10/26/19 0100 10/27/19 0434 10/28/19 0330  AST 58* 47* 43* 34 34  ALT 32 36 42 38 36  ALKPHOS 49 44 39 37* 36*  BILITOT 0.7 0.8 0.7 0.5 0.8  PROT 7.2 6.8 6.5 6.5 6.3*    ALBUMIN 2.5* 2.4* 2.4* 2.4* 2.3*   CBG: No results for input(s): GLUCAP in the last 168 hours.  Time spent: 35 minutes  Signed:  Berle Mull  [facility]  10/28/2019 6:28 PM

## 2019-10-28 NOTE — Progress Notes (Signed)
Gave pt discharge instructions. Pt verbalized understanding. Pt awaiting ride home.

## 2019-10-28 NOTE — Plan of Care (Signed)

## 2019-10-28 NOTE — Progress Notes (Signed)
Escorted off unit to Wife in awaiting car.  Verbalized good understanding of d/c plan

## 2019-10-28 NOTE — Telephone Encounter (Signed)
Please arrange a hospital follow-up, TCM, within a week, was discharge today.

## 2019-10-28 NOTE — Discharge Instructions (Signed)
COVID-19 Frequently Asked Questions COVID-19 (coronavirus disease) is an infection that is caused by a large family of viruses. Some viruses cause illness in people and others cause illness in animals like camels, cats, and bats. In some cases, the viruses that cause illness in animals can spread to humans. Where did the coronavirus come from? In December 2019, [place] told the [facility]) of several cases of lung disease (human respiratory illness). These cases were linked to an open seafood and livestock market in the city of [place]. The link to the seafood and livestock market suggests that the virus may have spread from animals to humans. However, since that first outbreak in December, the virus has also been shown to spread from person to person. What is the name of the disease and the virus? Disease name Early on, this disease was called novel coronavirus. This is because scientists determined that the disease was caused by a new (novel) respiratory virus. The World Health Organization [facility]) has now named the disease COVID-19, or coronavirus disease. Virus name The virus that causes the disease is called severe acute respiratory syndrome coronavirus 2 (SARS-CoV-2). More information on disease and virus naming World Health Organization [facility]): www.who.int/emergencies/diseases/novel-coronavirus-2019/technical-guidance/naming-the-coronavirus-disease-(covid-2019)-and-the-virus-that-causes-it Who is at risk for complications from coronavirus disease? Some people may be at higher risk for complications from coronavirus disease. This includes older adults and people who have chronic diseases, such as heart disease, diabetes, and lung disease. If you are at higher risk for complications, take these extra precautions:  Stay home as much as possible.  Avoid social gatherings and travel.  Avoid close contact with others. Stay at least 6 ft (2 m) away from others, if possible.  Wash  your hands often with soap and water for at least 20 seconds.  Avoid touching your face, mouth, nose, or eyes.  Keep supplies on hand at home, such as food, medicine, and cleaning supplies.  If you must go out in public, wear a cloth face covering or face mask. Make sure your mask covers your nose and mouth. How does coronavirus disease spread? The virus that causes coronavirus disease spreads easily from person to person (is contagious). You may catch the virus by:  Breathing in droplets from an infected person. Droplets can be spread by a person breathing, speaking, singing, coughing, or sneezing.  Touching something, like a table or a doorknob, that was exposed to the virus (contaminated) and then touching your mouth, nose, or eyes. Can I get the virus from touching surfaces or objects? There is still a lot that we do not know about the virus that causes coronavirus disease. Scientists are basing a lot of information on what they know about similar viruses, such as:  Viruses cannot generally survive on surfaces for long. They need a human body (host) to survive.  It is more likely that the virus is spread by close contact with people who are sick (direct contact), such as through: ? Shaking hands or hugging. ? Breathing in respiratory droplets that travel through the air. Droplets can be spread by a person breathing, speaking, singing, coughing, or sneezing.  It is less likely that the virus is spread when a person touches a surface or object that has the virus on it (indirect contact). The virus may be able to enter the body if the person touches a surface or object and then touches his or her face, eyes, nose, or mouth. Can a person spread the virus without having symptoms of the disease?  It may be possible for the virus to spread before a person has symptoms of the disease, but this is most likely not the main way the virus is spreading. It is more likely for the virus to spread by  being in close contact with people who are sick and breathing in the respiratory droplets spread by a person breathing, speaking, singing, coughing, or sneezing. °What are the symptoms of coronavirus disease? °Symptoms vary from person to person and can range from mild to severe. Symptoms may include: °· Fever or chills. °· Cough. °· Difficulty breathing or feeling short of breath. °· Headaches, body aches, or muscle aches. °· Runny or stuffy (congested) nose. °· Sore throat. °· New loss of taste or smell. °· Nausea, vomiting, or diarrhea. °These symptoms can appear anywhere from 2 to 14 days after you have been exposed to the virus. Some people may not have any symptoms. If you develop symptoms, call your health care provider. People with severe symptoms may need hospital care. °Should I be tested for this virus? °Your health care provider will decide whether to test you based on your symptoms, history of exposure, and your risk factors. °How does a health care provider test for this virus? °Health care providers will collect samples to send for testing. Samples may include: °· Taking a swab of fluid from the back of your nose and throat, your nose, or your throat. °· Taking fluid from the lungs by having you cough up mucus (sputum) into a sterile cup. °· Taking a blood sample. °Is there a treatment or vaccine for this virus? °Currently, there is no vaccine to prevent coronavirus disease. Also, there are no medicines like antibiotics or antivirals to treat the virus. A person who becomes sick is given supportive care, which means rest and fluids. A person may also relieve his or her symptoms by using over-the-counter medicines that treat sneezing, coughing, and runny nose. These are the same medicines that a person takes for the common cold. °If you develop symptoms, call your health care provider. People with severe symptoms may need hospital care. °What can I do to protect myself and my family from this  virus? ° °  ° °You can protect yourself and your family by taking the same actions that you would take to prevent the spread of other viruses. Take the following actions: °· Wash your hands often with soap and water for at least 20 seconds. If soap and water are not available, use alcohol-based hand sanitizer. °· Avoid touching your face, mouth, nose, or eyes. °· Cough or sneeze into a tissue, sleeve, or elbow. Do not cough or sneeze into your hand or the air. °? If you cough or sneeze into a tissue, throw it away immediately and wash your hands. °· Disinfect objects and surfaces that you frequently touch every day. °· Stay away from people who are sick. °· Avoid going out in public, follow guidance from your state and local health authorities. °· Avoid crowded indoor spaces. Stay at least 6 ft (2 m) away from others. °· If you must go out in public, wear a cloth face covering or face mask. Make sure your mask covers your nose and mouth. °· Stay home if you are sick, except to get medical care. Call your health care provider before you get medical care. Your health care provider will tell you how long to stay home. °· Make sure your vaccines are up to date. Ask your health care provider what   vaccines you need. °What should I do if I need to travel? °Follow travel recommendations from your local health authority, the CDC, and WHO. °Travel information and advice °· Centers for Disease Control and Prevention (CDC): www.cdc.gov/coronavirus/2019-ncov/travelers/index.html °· World Health Organization (WHO): www.who.int/emergencies/diseases/novel-coronavirus-2019/travel-advice °Know the risks and take action to protect your health °· You are at higher risk of getting coronavirus disease if you are traveling to areas with an outbreak or if you are exposed to travelers from areas with an outbreak. °· Wash your hands often and practice good hygiene to lower the risk of catching or spreading the virus. °What should I do if I  am sick? °General instructions to stop the spread of infection °· Wash your hands often with soap and water for at least 20 seconds. If soap and water are not available, use alcohol-based hand sanitizer. °· Cough or sneeze into a tissue, sleeve, or elbow. Do not cough or sneeze into your hand or the air. °· If you cough or sneeze into a tissue, throw it away immediately and wash your hands. °· Stay home unless you must get medical care. Call your health care provider or local health authority before you get medical care. °· Avoid public areas. Do not take public transportation, if possible. °· If you can, wear a mask if you must go out of the house or if you are in close contact with someone who is not sick. Make sure your mask covers your nose and mouth. °Keep your home clean °· Disinfect objects and surfaces that are frequently touched every day. This may include: °? Counters and tables. °? Doorknobs and light switches. °? Sinks and faucets. °? Electronics such as phones, remote controls, keyboards, computers, and tablets. °· Wash dishes in hot, soapy water or use a dishwasher. Air-dry your dishes. °· Wash laundry in hot water. °Prevent infecting other household members °· Let healthy household members care for children and pets, if possible. If you have to care for children or pets, wash your hands often and wear a mask. °· Sleep in a different bedroom or bed, if possible. °· Do not share personal items, such as razors, toothbrushes, deodorant, combs, brushes, towels, and washcloths. °Where to find more information °Centers for Disease Control and Prevention (CDC) °· Information and news updates: www.cdc.gov/coronavirus/2019-ncov °World Health Organization (WHO) °· Information and news updates: www.who.int/emergencies/diseases/novel-coronavirus-2019 °· Coronavirus health topic: www.who.int/health-topics/coronavirus °· Questions and answers on COVID-19: www.who.int/news-room/q-a-detail/q-a-coronaviruses °· Global  tracker: who.sprinklr.com °American Academy of Pediatrics (AAP) °· Information for families: www.healthychildren.org/English/health-issues/conditions/chest-lungs/Pages/2019-Novel-Coronavirus.aspx °The coronavirus situation is changing rapidly. Check your local health authority website or the CDC and WHO websites for updates and news. °When should I contact a health care provider? °· Contact your health care provider if you have symptoms of an infection, such as fever or cough, and you: °? Have been near anyone who is known to have coronavirus disease. °? Have come into contact with a person who is suspected to have coronavirus disease. °? Have traveled to an area where there is an outbreak of COVID-19. °When should I get emergency medical care? °· Get help right away by calling your local emergency services (911 in the [place] if you have: °? Trouble breathing. °? Pain or pressure in your chest. °? Confusion. °? Blue-tinged lips and fingernails. °? Difficulty waking from sleep. °? Symptoms that get worse. °Let the emergency medical personnel know if you think you have coronavirus disease. °Summary °· A new respiratory virus is spreading from person to person and causing   COVID-19 (coronavirus disease).  The virus that causes COVID-19 appears to spread easily. It spreads from one person to another through droplets from breathing, speaking, singing, coughing, or sneezing.  Older adults and those with chronic diseases are at higher risk of disease. If you are at higher risk for complications, take extra precautions.  There is currently no vaccine to prevent coronavirus disease. There are no medicines, such as antibiotics or antivirals, to treat the virus.  You can protect yourself and your family by washing your hands often, avoiding touching your face, and covering your coughs and sneezes. This information is not intended to replace advice given to you by your health care provider. Make sure you discuss any  questions you have with your health care provider. Document Revised: 11/07/2018 Document Reviewed: 05/06/2018 Elsevier Patient Education  2020 [employer].   COVID-19 COVID-19 is a respiratory infection that is caused by a virus called severe acute respiratory syndrome coronavirus 2 (SARS-CoV-2). The disease is also known as coronavirus disease or novel coronavirus. In some people, the virus may not cause any symptoms. In others, it may cause a serious infection. The infection can get worse quickly and can lead to complications, such as:  Pneumonia, or infection of the lungs.  Acute respiratory distress syndrome or ARDS. This is a condition in which fluid build-up in the lungs prevents the lungs from filling with air and passing oxygen into the blood.  Acute respiratory failure. This is a condition in which there is not enough oxygen passing from the lungs to the body or when carbon dioxide is not passing from the lungs out of the body.  Sepsis or septic shock. This is a serious bodily reaction to an infection.  Blood clotting problems.  Secondary infections due to bacteria or fungus.  Organ failure. This is when your body's organs stop working. The virus that causes COVID-19 is contagious. This means that it can spread from person to person through droplets from coughs and sneezes (respiratory secretions). What are the causes? This illness is caused by a virus. You may catch the virus by:  Breathing in droplets from an infected person. Droplets can be spread by a person breathing, speaking, singing, coughing, or sneezing.  Touching something, like a table or a doorknob, that was exposed to the virus (contaminated) and then touching your mouth, nose, or eyes. What increases the risk? Risk for infection You are more likely to be infected with this virus if you:  Are within 6 feet (2 meters) of a person with COVID-19.  Provide care for or live with a person who is infected with  COVID-19.  Spend time in crowded indoor spaces or live in shared housing. Risk for serious illness You are more likely to become seriously ill from the virus if you:  Are 35 years of age or older. The higher your age, the more you are at risk for serious illness.  Live in a nursing home or long-term care facility.  Have cancer.  Have a long-term (chronic) disease such as: ? Chronic lung disease, including chronic obstructive pulmonary disease or asthma. ? A long-term disease that lowers your body's ability to fight infection (immunocompromised). ? Heart disease, including heart failure, a condition in which the arteries that lead to the heart become narrow or blocked (coronary artery disease), a disease which makes the heart muscle thick, weak, or stiff (cardiomyopathy). ? Diabetes. ? Chronic kidney disease. ? Sickle cell disease, a condition in which red blood cells have  an abnormal "sickle" shape. ? Liver disease.  Are obese. What are the signs or symptoms? Symptoms of this condition can range from mild to severe. Symptoms may appear any time from 2 to 14 days after being exposed to the virus. They include:  A fever or chills.  A cough.  Difficulty breathing.  Headaches, body aches, or muscle aches.  Runny or stuffy (congested) nose.  A sore throat.  New loss of taste or smell. Some people may also have stomach problems, such as nausea, vomiting, or diarrhea. Other people may not have any symptoms of COVID-19. How is this diagnosed? This condition may be diagnosed based on:  Your signs and symptoms, especially if: ? You live in an area with a COVID-19 outbreak. ? You recently traveled to or from an area where the virus is common. ? You provide care for or live with a person who was diagnosed with COVID-19. ? You were exposed to a person who was diagnosed with COVID-19.  A physical exam.  Lab tests, which may include: ? Taking a sample of fluid from the back of  your nose and throat (nasopharyngeal fluid), your nose, or your throat using a swab. ? A sample of mucus from your lungs (sputum). ? Blood tests.  Imaging tests, which may include, X-rays, CT scan, or ultrasound. How is this treated? At present, there is no medicine to treat COVID-19. Medicines that treat other diseases are being used on a trial basis to see if they are effective against COVID-19. Your health care provider will talk with you about ways to treat your symptoms. For most people, the infection is mild and can be managed at home with rest, fluids, and over-the-counter medicines. Treatment for a serious infection usually takes places in a hospital intensive care unit (ICU). It may include one or more of the following treatments. These treatments are given until your symptoms improve.  Receiving fluids and medicines through an IV.  Supplemental oxygen. Extra oxygen is given through a tube in the nose, a face mask, or a hood.  Positioning you to lie on your stomach (prone position). This makes it easier for oxygen to get into the lungs.  Continuous positive airway pressure (CPAP) or bi-level positive airway pressure (BPAP) machine. This treatment uses mild air pressure to keep the airways open. A tube that is connected to a motor delivers oxygen to the body.  Ventilator. This treatment moves air into and out of the lungs by using a tube that is placed in your windpipe.  Tracheostomy. This is a procedure to create a hole in the neck so that a breathing tube can be inserted.  Extracorporeal membrane oxygenation (ECMO). This procedure gives the lungs a chance to recover by taking over the functions of the heart and lungs. It supplies oxygen to the body and removes carbon dioxide. Follow these instructions at home: Lifestyle  If you are sick, stay home except to get medical care. Your health care provider will tell you how long to stay home. Call your health care provider before you go  for medical care.  Rest at home as told by your health care provider.  Do not use any products that contain nicotine or tobacco, such as cigarettes, e-cigarettes, and chewing tobacco. If you need help quitting, ask your health care provider.  Return to your normal activities as told by your health care provider. Ask your health care provider what activities are safe for you. General instructions  Take over-the-counter and  prescription medicines only as told by your health care provider.  Drink enough fluid to keep your urine pale yellow.  Keep all follow-up visits as told by your health care provider. This is important. How is this prevented?  There is no vaccine to help prevent COVID-19 infection. However, there are steps you can take to protect yourself and others from this virus. To protect yourself:   Do not travel to areas where COVID-19 is a risk. The areas where COVID-19 is reported change often. To identify high-risk areas and travel restrictions, check the CDC travel website: FatFares.com.br  If you live in, or must travel to, an area where COVID-19 is a risk, take precautions to avoid infection. ? Stay away from people who are sick. ? Wash your hands often with soap and water for 20 seconds. If soap and water are not available, use an alcohol-based hand sanitizer. ? Avoid touching your mouth, face, eyes, or nose. ? Avoid going out in public, follow guidance from your state and local health authorities. ? If you must go out in public, wear a cloth face covering or face mask. Make sure your mask covers your nose and mouth. ? Avoid crowded indoor spaces. Stay at least 6 feet (2 meters) away from others. ? Disinfect objects and surfaces that are frequently touched every day. This may include:  Counters and tables.  Doorknobs and light switches.  Sinks and faucets.  Electronics, such as phones, remote controls, keyboards, computers, and tablets. To protect  others: If you have symptoms of COVID-19, take steps to prevent the virus from spreading to others.  If you think you have a COVID-19 infection, contact your health care provider right away. Tell your health care team that you think you may have a COVID-19 infection.  Stay home. Leave your house only to seek medical care. Do not use public transport.  Do not travel while you are sick.  Wash your hands often with soap and water for 20 seconds. If soap and water are not available, use alcohol-based hand sanitizer.  Stay away from other members of your household. Let healthy household members care for children and pets, if possible. If you have to care for children or pets, wash your hands often and wear a mask. If possible, stay in your own room, separate from others. Use a different bathroom.  Make sure that all people in your household wash their hands well and often.  Cough or sneeze into a tissue or your sleeve or elbow. Do not cough or sneeze into your hand or into the air.  Wear a cloth face covering or face mask. Make sure your mask covers your nose and mouth. Where to find more information  Centers for Disease Control and Prevention: PurpleGadgets.be  World Health Organization: https://www.castaneda.info/ Contact a health care provider if:  You live in or have traveled to an area where COVID-19 is a risk and you have symptoms of the infection.  You have had contact with someone who has COVID-19 and you have symptoms of the infection. Get help right away if:  You have trouble breathing.  You have pain or pressure in your chest.  You have confusion.  You have bluish lips and fingernails.  You have difficulty waking from sleep.  You have symptoms that get worse. These symptoms may represent a serious problem that is an emergency. Do not wait to see if the symptoms will go away. Get medical help right away. Call your local emergency  services (  911 in the [place] Do not drive yourself to the hospital. Let the emergency medical personnel know if you think you have COVID-19. Summary  COVID-19 is a respiratory infection that is caused by a virus. It is also known as coronavirus disease or novel coronavirus. It can cause serious infections, such as pneumonia, acute respiratory distress syndrome, acute respiratory failure, or sepsis.  The virus that causes COVID-19 is contagious. This means that it can spread from person to person through droplets from breathing, speaking, singing, coughing, or sneezing.  You are more likely to develop a serious illness if you are 10 years of age or older, have a weak immune system, live in a nursing home, or have chronic disease.  There is no medicine to treat COVID-19. Your health care provider will talk with you about ways to treat your symptoms.  Take steps to protect yourself and others from infection. Wash your hands often and disinfect objects and surfaces that are frequently touched every day. Stay away from people who are sick and wear a mask if you are sick. This information is not intended to replace advice given to you by your health care provider. Make sure you discuss any questions you have with your health care provider. Document Revised: 11/07/2018 Document Reviewed: 02/13/2018 Elsevier Patient Education  2020 [employer].  COVID-19: How to Protect Yourself and Others Know how it spreads  There is currently no vaccine to prevent coronavirus disease 2019 (COVID-19).  The best way to prevent illness is to avoid being exposed to this virus.  The virus is thought to spread mainly from person-to-person. ? Between people who are in close contact with one another (within about 6 feet). ? Through respiratory droplets produced when an infected person coughs, sneezes or talks. ? These droplets can land in the mouths or noses of people who are nearby or possibly be inhaled into the  lungs. ? COVID-19 may be spread by people who are not showing symptoms. Everyone should Clean your hands often  Wash your hands often with soap and water for at least 20 seconds especially after you have been in a public place, or after blowing your nose, coughing, or sneezing.  If soap and water are not readily available, use a hand sanitizer that contains at least 60% alcohol. Cover all surfaces of your hands and rub them together until they feel dry.  Avoid touching your eyes, nose, and mouth with unwashed hands. Avoid close contact  Limit contact with others as much as possible.  Avoid close contact with people who are sick.  Put distance between yourself and other people. ? Remember that some people without symptoms may be able to spread virus. ? This is especially important for people who are at higher risk of getting very [email or web address] Cover your mouth and nose with a mask when around others  You could spread COVID-19 to others even if you do not feel sick.  Everyone should wear a mask in public settings and when around people not living in their household, especially when social distancing is difficult to maintain. ? Masks should not be placed on young children under age 52, anyone who has trouble breathing, or is unconscious, incapacitated or otherwise unable to remove the mask without assistance.  The mask is meant to protect other people in case you are infected.  Do NOT use a facemask meant for a Dietitian.  Continue to keep about 6 feet between yourself and others. The mask is not a  substitute for social distancing. Cover coughs and sneezes  Always cover your mouth and nose with a tissue when you cough or sneeze or use the inside of your elbow.  Throw used tissues in the trash.  Immediately wash your hands with soap and water for at least 20 seconds. If soap and water are not readily  available, clean your hands with a hand sanitizer that contains at least 60% alcohol. Clean and disinfect  Clean AND disinfect frequently touched surfaces daily. This includes tables, doorknobs, light switches, countertops, handles, desks, phones, keyboards, toilets, faucets, and sinks. [email or web address]  If surfaces are dirty, clean them: Use detergent or soap and water prior to disinfection.  Then, use a household disinfectant. You can see a list of EPA-registered household disinfectants here. [email or web address].com 09/24/2018 This information is not intended to replace advice given to you by your health care provider. Make sure you discuss any questions you have with your health care provider. Document Revised: 10/02/2018 Document Reviewed: 07/31/2018 Elsevier Patient Education  [place] Can Do to Manage Your COVID-19 Symptoms at Home If you have possible or confirmed COVID-19: 1. Stay home from work and school. And stay away from other public places. If you must go out, avoid using any kind of public transportation, ridesharing, or taxis. 2. Monitor your symptoms carefully. If your symptoms get worse, call your healthcare provider immediately. 3. Get rest and stay hydrated. 4. If you have a medical appointment, call the healthcare provider ahead of time and tell them that you have or may have COVID-19. 5. For medical emergencies, call 911 and notify the dispatch personnel that you have or may have COVID-19. 6. Cover your cough and sneezes with a tissue or use the inside of your elbow. 7. Wash your hands often with soap and water for at least 20 seconds or clean your hands with an alcohol-based hand sanitizer that contains at least 60% alcohol. 8. As much as possible, stay in a specific room and away from other people in your home. Also, you should use a separate bathroom, if available. If you need to be  around other people in or outside of the home, wear a mask. 9. Avoid sharing personal items with other people in your household, like dishes, towels, and bedding. 10. Clean all surfaces that are touched often, like counters, tabletops, and doorknobs. Use household cleaning sprays or wipes according to the label instructions. [email or web address].com 07/23/2018 This information is not intended to replace advice given to you by your health care provider. Make sure you discuss any questions you have with your health care provider. Document Revised: 12/25/2018 Document Reviewed: 12/25/2018 Elsevier Patient Education  [place].

## 2019-10-29 ENCOUNTER — Telehealth: Payer: Self-pay

## 2019-10-29 ENCOUNTER — Telehealth: Payer: Self-pay | Admitting: *Deleted

## 2019-10-29 DIAGNOSIS — Z792 Long term (current) use of antibiotics: Secondary | ICD-10-CM | POA: Diagnosis not present

## 2019-10-29 DIAGNOSIS — K449 Diaphragmatic hernia without obstruction or gangrene: Secondary | ICD-10-CM | POA: Diagnosis not present

## 2019-10-29 DIAGNOSIS — E785 Hyperlipidemia, unspecified: Secondary | ICD-10-CM | POA: Diagnosis not present

## 2019-10-29 DIAGNOSIS — R000 Tachycardia, unspecified: Secondary | ICD-10-CM | POA: Diagnosis not present

## 2019-10-29 DIAGNOSIS — K219 Gastro-esophageal reflux disease without esophagitis: Secondary | ICD-10-CM | POA: Diagnosis not present

## 2019-10-29 DIAGNOSIS — K2270 Barrett's esophagus without dysplasia: Secondary | ICD-10-CM | POA: Diagnosis not present

## 2019-10-29 DIAGNOSIS — Z452 Encounter for adjustment and management of vascular access device: Secondary | ICD-10-CM | POA: Diagnosis not present

## 2019-10-29 DIAGNOSIS — I700 Atherosclerosis of aorta: Secondary | ICD-10-CM | POA: Diagnosis not present

## 2019-10-29 DIAGNOSIS — K2980 Duodenitis without bleeding: Secondary | ICD-10-CM | POA: Diagnosis not present

## 2019-10-29 DIAGNOSIS — A4189 Other specified sepsis: Secondary | ICD-10-CM | POA: Diagnosis not present

## 2019-10-29 DIAGNOSIS — D696 Thrombocytopenia, unspecified: Secondary | ICD-10-CM | POA: Diagnosis not present

## 2019-10-29 DIAGNOSIS — I2510 Atherosclerotic heart disease of native coronary artery without angina pectoris: Secondary | ICD-10-CM | POA: Diagnosis not present

## 2019-10-29 DIAGNOSIS — Z981 Arthrodesis status: Secondary | ICD-10-CM | POA: Diagnosis not present

## 2019-10-29 DIAGNOSIS — I119 Hypertensive heart disease without heart failure: Secondary | ICD-10-CM | POA: Diagnosis not present

## 2019-10-29 DIAGNOSIS — N390 Urinary tract infection, site not specified: Secondary | ICD-10-CM | POA: Diagnosis not present

## 2019-10-29 DIAGNOSIS — F419 Anxiety disorder, unspecified: Secondary | ICD-10-CM | POA: Diagnosis not present

## 2019-10-29 DIAGNOSIS — F32A Depression, unspecified: Secondary | ICD-10-CM | POA: Diagnosis not present

## 2019-10-29 DIAGNOSIS — N400 Enlarged prostate without lower urinary tract symptoms: Secondary | ICD-10-CM | POA: Diagnosis not present

## 2019-10-29 DIAGNOSIS — D61818 Other pancytopenia: Secondary | ICD-10-CM | POA: Diagnosis not present

## 2019-10-29 DIAGNOSIS — M5126 Other intervertebral disc displacement, lumbar region: Secondary | ICD-10-CM | POA: Diagnosis not present

## 2019-10-29 DIAGNOSIS — Z7952 Long term (current) use of systemic steroids: Secondary | ICD-10-CM | POA: Diagnosis not present

## 2019-10-29 DIAGNOSIS — J9811 Atelectasis: Secondary | ICD-10-CM | POA: Diagnosis not present

## 2019-10-29 DIAGNOSIS — C9000 Multiple myeloma not having achieved remission: Secondary | ICD-10-CM | POA: Diagnosis not present

## 2019-10-29 DIAGNOSIS — M170 Bilateral primary osteoarthritis of knee: Secondary | ICD-10-CM | POA: Diagnosis not present

## 2019-10-29 DIAGNOSIS — U071 COVID-19: Secondary | ICD-10-CM | POA: Diagnosis not present

## 2019-10-29 DIAGNOSIS — D630 Anemia in neoplastic disease: Secondary | ICD-10-CM | POA: Diagnosis not present

## 2019-10-29 LAB — PREPARE PLATELET PHERESIS: Unit division: 0

## 2019-10-29 LAB — BPAM PLATELET PHERESIS
Blood Product Expiration Date: 202110072359
ISSUE DATE / TIME: 202110061316
Unit Type and Rh: 6200

## 2019-10-29 LAB — CULTURE, BLOOD (ROUTINE X 2): Culture: NO GROWTH

## 2019-10-29 NOTE — Telephone Encounter (Signed)
Transition Care Management Follow-up Telephone Call  Date of discharge and from where: 10/28/2019-[place]  How have you been since you were released from the hospital? Good.  Any questions or concerns? No  Items Reviewed:  Did the pt receive and understand the discharge instructions provided? Yes   Medications obtained and verified? Yes  Patient's wife plans tp pick up prednsone form the pharmacy today.  Any new allergies since your discharge? No   Dietary orders reviewed? Yes  Do you have support at home? Yes   Functional Questionnaire: (I = Independent and D = Dependent) ADLs: I-with assistance  Bathing/Dressing- I-with assistance  Meal Prep- D  Eating- I  Maintaining continence- I-Condom Catheter  Transferring/Ambulation- I  Managing Meds- I-with assistance  Follow up appointments reviewed:   PCP Hospital f/u appt confirmed? No  Patient's wife to call back to schedule  [facility] f/u appt confirmed? No  N/A  Are transportation arrangements needed? No   If their condition worsens, is the pt aware to call PCP or go to the Emergency Dept.? Yes  Was the patient provided with contact information for the PCP's office or ED? Yes  Was to pt encouraged to call back with questions or concerns? Yes

## 2019-10-29 NOTE — Telephone Encounter (Signed)
Message received from patient's wife requesting a call back regarding pt.'s schedule.  Call placed back to patient's wife and pt.'s wife notified that [facility] will draw a CBC and CMET on 11/03/19, will fax results back to this office and that appt has been added for pt to come in on 11/11/19 at 0815 per order of Dr. Marin Olp.  Pt.'s wife states that Alvis Lemmings was at their house today and will draw pt.'s labs on Monday, 11/02/19.  Pt.'s wife appreciative of assistance with appt times and has no further questions at this time.

## 2019-10-30 LAB — CULTURE, BLOOD (ROUTINE X 2)
Culture: NO GROWTH
Special Requests: ADEQUATE

## 2019-11-02 DIAGNOSIS — A419 Sepsis, unspecified organism: Secondary | ICD-10-CM | POA: Diagnosis not present

## 2019-11-02 LAB — BASIC METABOLIC PANEL
BUN: 17 (ref 4–21)
CO2: 24 — AB (ref 13–22)
Chloride: 101 (ref 99–108)
Creatinine: 1 (ref 0.6–1.3)
Glucose: 170
Potassium: 3.6 (ref 3.4–5.3)
Sodium: 141 (ref 137–147)

## 2019-11-02 LAB — HEPATIC FUNCTION PANEL
ALT: 28 (ref 10–40)
AST: 27 (ref 14–40)
Alkaline Phosphatase: 52 (ref 25–125)
Bilirubin, Total: 0.3

## 2019-11-02 LAB — COMPREHENSIVE METABOLIC PANEL
Albumin: 2.9 — AB (ref 3.5–5.0)
Calcium: 8.7 (ref 8.7–10.7)
GFR calc Af Amer: 81
GFR calc non Af Amer: 70
Globulin: 4

## 2019-11-02 LAB — CBC: RBC: 2.16 — AB (ref 3.87–5.11)

## 2019-11-03 ENCOUNTER — Telehealth: Payer: Self-pay

## 2019-11-03 ENCOUNTER — Telehealth (INDEPENDENT_AMBULATORY_CARE_PROVIDER_SITE_OTHER): Payer: PPO | Admitting: Internal Medicine

## 2019-11-03 ENCOUNTER — Inpatient Hospital Stay: Payer: PPO

## 2019-11-03 ENCOUNTER — Encounter: Payer: Self-pay | Admitting: Internal Medicine

## 2019-11-03 VITALS — Ht 65.0 in | Wt 190.0 lb

## 2019-11-03 DIAGNOSIS — I10 Essential (primary) hypertension: Secondary | ICD-10-CM

## 2019-11-03 DIAGNOSIS — D61818 Other pancytopenia: Secondary | ICD-10-CM | POA: Diagnosis not present

## 2019-11-03 LAB — CBC AND DIFFERENTIAL
HCT: 20 — AB (ref 41–53)
Hemoglobin: 6.6 — AB (ref 13.5–17.5)
Neutrophils Absolute: 2
Platelets: 13 — AB (ref 150–399)
WBC: 2.3

## 2019-11-03 NOTE — Progress Notes (Signed)
Subjective:    Patient ID: Gene Hill, male    DOB: 1940/11/05, 79 y.o.   MRN: [id number]  DOS:  11/03/2019 Type of visit - description: Virtual Visit via Telephone    I connected with above mentioned patient  by telephone and verified that I am speaking with the correct person using two identifiers.  THIS ENCOUNTER IS A VIRTUAL VISIT DUE TO COVID-19 - PATIENT WAS NOT SEEN IN THE OFFICE. PATIENT HAS CONSENTED TO VIRTUAL VISIT / TELEMEDICINE VISIT   Location of patient: home  Location of provider: office  Persons participating in the virtual visit: patient, provider   I discussed the limitations, risks, security and privacy concerns of performing an evaluation and management service by telephone and the availability of in person appointments. I also discussed with the patient that there may be a patient responsible charge related to this service. The patient expressed understanding and agreed to proceed.  Hospital follow-up Was admitted to the hospital 10/21/2019 and discharged 10/28/2019. He reported fever, generalized weakness 1 day prior to admission. He had a Covid vaccine #2  few days prior to admission. He tested positive for Covid. He also was diagnosed with sepsis: + Staph epi bacteremia, staph hemolyticus UTI. Chest x-ray negative for pneumonia. He maintained oxygen saturation on room air.  ID consulted because he has a Port-A-Cath, they recommended to leave it in place and continue IV antibiotics. Received remdesivir, Solu-Medrol, oral prednisone taper, vitamin C and zinc per Covid protocol. Was released on IV antibiotics.  D-dimer elevated, lower extremity ultrasound negative for DVT.  Pancytopenia noted, due to multiple myeloma  Palliative care consulted, he is now DNR.    Review of Systems Since he left the hospital he is at home under the care of his wife. He feels he is improving every day. No fever chills Shortness of breath improved. Legs are getting  stronger No rash or bleeding No headaches BPs are actually slightly elevated  Past Medical History:  Diagnosis Date  . Arthritis   . 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 Blood dyscrasia    low WBC d/t multiple myeloma  . Bulging lumbar disc    L2-3  . Carotid bruit    3-11 carotid u/s was  (-)  . 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 GERD (gastroesophageal reflux disease)   . Headache   . Heart murmur    doesn't have one  . History of hiatal hernia   . HYPERLIPIDEMIA 05/03/2006  . HYPERTENSION 05/03/2006  . 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 Torn rotator cuff 01/22/2017   left- [place]    Past Surgical History:  Procedure Laterality Date  . BACK SURGERY    . CERVICAL FUSION    . IR FLUORO GUIDE PORT INSERTION RIGHT  11/29/2016  . IR US GUIDE VASC ACCESS RIGHT  11/29/2016  . [place]   right  . REVERSE SHOULDER ARTHROPLASTY Right 06/25/2018   Procedure: REVERSE SHOULDER ARTHROPLASTY;  Surgeon: Hiram Gash, MD;  Location: [facility];  Service: Orthopedics;  Laterality: Right;  . REVERSE SHOULDER ARTHROPLASTY Left 11/19/2018   Procedure: REVERSE SHOULDER ARTHROPLASTY;  Surgeon: Hiram Gash, MD;  Location: [facility];  Service: Orthopedics;  Laterality: Left;  .  ROTATOR CUFF REPAIR     right  . SPINAL FUSION     lumbar  . TOTAL KNEE ARTHROPLASTY Right 12/23/2015   Procedure: TOTAL KNEE ARTHROPLASTY;  Surgeon: Earlie Server, MD;  Location: [place];  Service: Orthopedics;  Laterality: Right;  . TOTAL KNEE REVISION Right 02/18/2019  . TOTAL KNEE REVISION Right 02/18/2019   Procedure: TOTAL KNEE REVISION;  Surgeon: Earlie Server, MD;  Location: [place];  Service: Orthopedics;  Laterality: Right;    Allergies as of 11/03/2019   No Known Allergies       Medication List       Accurate as of November 03, 2019 11:59 PM. If you have any questions, ask your nurse or doctor.        acetaminophen 500 MG tablet Commonly known as: TYLENOL Take 1,000 mg by mouth 3 (three) times daily as needed (pain).   ascorbic acid 500 MG tablet Commonly known as: VITAMIN C Take 1 tablet (500 mg total) by mouth daily.   b complex vitamins tablet Take 1 tablet by mouth daily.   calcium carbonate 1500 (600 Ca) MG Tabs tablet Commonly known as: OSCAL Take 600 mg of elemental calcium by mouth daily with breakfast.   dronabinol 2.5 MG capsule Commonly known as: MARINOL TAKE 1 CAPSULE(2.5 MG) BY MOUTH TWICE DAILY BEFORE A MEAL What changed:   how much to take  how to take this  when to take this  additional instructions   famciclovir 250 MG tablet Commonly known as: FAMVIR Take 1 tablet (250 mg total) by mouth daily.   Fish Oil 1200 MG Caps Take 1,200 mg by mouth daily.   LORazepam 0.5 MG tablet Commonly known as: ATIVAN Take 0.5 mg by mouth every 6 (six) hours as needed (nausea/vomiting).   methocarbamol 500 MG tablet Commonly known as: ROBAXIN Take 1 tablet (500 mg total) by mouth 3 (three) times daily as needed for muscle spasms.   metoprolol tartrate 50 MG tablet Commonly known as: LOPRESSOR Take 1 tablet (50 mg total) by mouth 2 (two) times daily.   mirtazapine 7.5 MG tablet Commonly known as: REMERON Take 1 tablet (7.5 mg total) by mouth at bedtime.   multivitamin tablet Take 1 tablet by mouth daily.   ondansetron 8 MG tablet Commonly known as: ZOFRAN Take 8 mg by mouth every 8 (eight) hours as needed for nausea or vomiting.   OVER THE COUNTER MEDICATION Take 2.5 mg by mouth daily. Marijuana (Appetite stimulant)   pantoprazole 40 MG tablet Commonly known as: PROTONIX Take 1 tablet (40 mg total) by mouth at bedtime.   predniSONE 10 MG tablet Commonly known as: DELTASONE Take 79m daily for 3days,Take 493mdaily for  3days,Take 3014maily for 3days,Take 46m64mily for 3days,Take 10mg64mly for 3days, then stop   PROBIOTIC PO Take 1 tablet by mouth daily.   tamsulosin 0.4 MG Caps capsule Commonly known as: FLOMAX Take 0.4 mg by mouth at bedtime.   traMADol 50 MG tablet Commonly known as: ULTRAM Take 1 tablet (50 mg total) by mouth every 6 (six) hours as needed. 90 day supply please   vancomycin  IVPB Inject 1,250 mg into the vein daily for 12 days. Indication:  MRSE Bacteremia  First Dose: No Last Day of Therapy:  11/07/2019 Labs - Sunday/Monday:  CBC/D, BMP, and vancomycin trough. Labs - Thursday:  BMP and vancomycin trough Labs - Every other week:  ESR and CRP Method of administration:Elastomeric Method of administration may be changed at  the discretion of the patient and/or caregiver's ability to self-administer the medication ordered.   VITAMIN B-12 PO Take 5,000 mcg by mouth daily.   zinc sulfate 220 (50 Zn) MG capsule Take 1 capsule (220 mg total) by mouth daily.          Objective:   Physical Exam Ht _0  (1.651 m)   Wt 190 lb (86.2 kg)   BMI 31.62 kg/m  This is a telephone communication, they were unable to connect via video.  He is sounded well, in no distress    Assessment      Assessment Pre-diabetes HTN Hyperlipidemia (self dc  simva 11-2014)  CAD by cath 1998, medical management, Cardiolite ~ 2007 ? 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 + Cultures upon admission, repeated blood cultures 10/24/2019: Negative. Was discharged on vancomycin, he still has 4 additional days. ID recommended to leave Port-A-Cath in place. Labs were drawn yesterday and faxed to  me today: Vanco throw is 16.9, slightly elevated.  No change for now. Pancytopenia Labs from yesterday: WBC is 2.3, hemoglobin 6.6, platelets 13.  Hemoglobin is lower than when he was discharged from the hospital, case discussed with Dr. Marin Olp, at this point he will need a transfusion only if is very symptomatic. Fortunately the patient is doing well with no fever chills, chest pain, DOE or rash.  He has an appointment with oncology in few days. HTN: BP is normal or slightly elevated, yesterday 158/70.  Recommend to continue monitor BPs and let me know if they are high. For now I will asked to RTC as needed   I discussed the assessment and treatment plan with the patient. The patient was provided an opportunity to ask questions and all were answered. The patient agreed with the plan and demonstrated an understanding of the instructions.   The patient was advised to call back or seek an in-person evaluation if the symptoms worsen or if the condition fails to improve as anticipated.  I provided 25 minutes of non-face-to-face time during this encounter.  [name], MD

## 2019-11-03 NOTE — Telephone Encounter (Signed)
Labs received from [facility]). Drawn yesterday (11/02/2019).  Hgb 6.6 Platelets 13 WBC 2.3  Vancomycin Trough, Serum 16.9 (Reference range 10.0-15.0)  PCP verbally made aware 11:50am 11/03/2019.

## 2019-11-03 NOTE — Progress Notes (Signed)
Pre visit review using our clinic review tool, if applicable. No additional management support is needed unless otherwise documented below in the visit note. 

## 2019-11-03 NOTE — Telephone Encounter (Signed)
Spoke w/ Gene Hill at [place]- [phone])- Pt is currently on IV vancomycin 1.250mg  daily as stated on med list.

## 2019-11-03 NOTE — Telephone Encounter (Signed)
Hemoglobin has decreased, platelets slightly compared to recent admission. Discussed results with hematology, he will need a transfusion only if he is really symptomatic.  He has an appointment with me later on today. Also, please ask the home health agency what is the current dose of vancomycin and let me know.

## 2019-11-03 NOTE — Telephone Encounter (Signed)
Vancomycin trough slightly elevated 16.9 (reference range 10.0-15.0) no change from now, will share results with ID.

## 2019-11-04 ENCOUNTER — Telehealth: Payer: Self-pay | Admitting: Internal Medicine

## 2019-11-04 NOTE — Telephone Encounter (Signed)
    Patient's spouse called in reference to patient taking Asprin after  Heparin injections is complete  . Please advise if patient should start back taking Asprin 325mg .   Please Advise

## 2019-11-04 NOTE — Telephone Encounter (Signed)
Spoke w/ Bartolo Darter- informed to continue to hold aspirin. [place] verbalized understanding.

## 2019-11-04 NOTE — Telephone Encounter (Signed)
Please advise 

## 2019-11-04 NOTE — Telephone Encounter (Signed)
Recommend not to restart aspirin at this point, platelets are very low

## 2019-11-05 DIAGNOSIS — A4189 Other specified sepsis: Secondary | ICD-10-CM | POA: Diagnosis not present

## 2019-11-05 NOTE — Assessment & Plan Note (Signed)
Sepsis, bacteremia due to staph epidermidis, staph hemolyticus UTI. + Cultures upon admission, repeated blood cultures 10/24/2019: Negative. Was discharged on vancomycin, he still has 4 additional days. ID recommended to leave Port-A-Cath in place. Labs were drawn yesterday and faxed to me today: Vanco throw is 16.9, slightly elevated.  No change for now. Pancytopenia Labs from yesterday: WBC is 2.3, hemoglobin 6.6, platelets 13.  Hemoglobin is lower than when he was discharged from the hospital, case discussed with Dr. Marin Olp, at this point he will need a transfusion only if is very symptomatic. Fortunately the patient is doing well with no fever chills, chest pain, DOE or rash.  He has an appointment with oncology in few days. HTN: BP is normal or slightly elevated, yesterday 158/70.  Recommend to continue monitor BPs and let me know if they are high. For now I will asked to RTC as needed

## 2019-11-06 ENCOUNTER — Telehealth: Payer: Self-pay

## 2019-11-06 DIAGNOSIS — M170 Bilateral primary osteoarthritis of knee: Secondary | ICD-10-CM

## 2019-11-06 DIAGNOSIS — Z8711 Personal history of peptic ulcer disease: Secondary | ICD-10-CM

## 2019-11-06 DIAGNOSIS — N400 Enlarged prostate without lower urinary tract symptoms: Secondary | ICD-10-CM

## 2019-11-06 DIAGNOSIS — F32A Depression, unspecified: Secondary | ICD-10-CM

## 2019-11-06 DIAGNOSIS — U071 COVID-19: Secondary | ICD-10-CM

## 2019-11-06 DIAGNOSIS — I700 Atherosclerosis of aorta: Secondary | ICD-10-CM

## 2019-11-06 DIAGNOSIS — Z7952 Long term (current) use of systemic steroids: Secondary | ICD-10-CM

## 2019-11-06 DIAGNOSIS — K449 Diaphragmatic hernia without obstruction or gangrene: Secondary | ICD-10-CM

## 2019-11-06 DIAGNOSIS — Z981 Arthrodesis status: Secondary | ICD-10-CM

## 2019-11-06 DIAGNOSIS — K2270 Barrett's esophagus without dysplasia: Secondary | ICD-10-CM

## 2019-11-06 DIAGNOSIS — Z96611 Presence of right artificial shoulder joint: Secondary | ICD-10-CM

## 2019-11-06 DIAGNOSIS — Z87891 Personal history of nicotine dependence: Secondary | ICD-10-CM

## 2019-11-06 DIAGNOSIS — D61818 Other pancytopenia: Secondary | ICD-10-CM

## 2019-11-06 DIAGNOSIS — Z9181 History of falling: Secondary | ICD-10-CM

## 2019-11-06 DIAGNOSIS — D696 Thrombocytopenia, unspecified: Secondary | ICD-10-CM

## 2019-11-06 DIAGNOSIS — K219 Gastro-esophageal reflux disease without esophagitis: Secondary | ICD-10-CM

## 2019-11-06 DIAGNOSIS — J9811 Atelectasis: Secondary | ICD-10-CM

## 2019-11-06 DIAGNOSIS — I119 Hypertensive heart disease without heart failure: Secondary | ICD-10-CM

## 2019-11-06 DIAGNOSIS — M5126 Other intervertebral disc displacement, lumbar region: Secondary | ICD-10-CM

## 2019-11-06 DIAGNOSIS — Z452 Encounter for adjustment and management of vascular access device: Secondary | ICD-10-CM

## 2019-11-06 DIAGNOSIS — D630 Anemia in neoplastic disease: Secondary | ICD-10-CM

## 2019-11-06 DIAGNOSIS — R000 Tachycardia, unspecified: Secondary | ICD-10-CM

## 2019-11-06 DIAGNOSIS — Z96612 Presence of left artificial shoulder joint: Secondary | ICD-10-CM

## 2019-11-06 DIAGNOSIS — K2980 Duodenitis without bleeding: Secondary | ICD-10-CM

## 2019-11-06 DIAGNOSIS — A4189 Other specified sepsis: Secondary | ICD-10-CM

## 2019-11-06 DIAGNOSIS — F419 Anxiety disorder, unspecified: Secondary | ICD-10-CM

## 2019-11-06 DIAGNOSIS — I2510 Atherosclerotic heart disease of native coronary artery without angina pectoris: Secondary | ICD-10-CM

## 2019-11-06 DIAGNOSIS — C9000 Multiple myeloma not having achieved remission: Secondary | ICD-10-CM | POA: Diagnosis not present

## 2019-11-06 DIAGNOSIS — Z96651 Presence of right artificial knee joint: Secondary | ICD-10-CM

## 2019-11-06 DIAGNOSIS — E785 Hyperlipidemia, unspecified: Secondary | ICD-10-CM

## 2019-11-06 DIAGNOSIS — Z792 Long term (current) use of antibiotics: Secondary | ICD-10-CM

## 2019-11-06 NOTE — Telephone Encounter (Signed)
Plan of care signed and faxed back to [facility] at [phone]. Form sent for scanning.  

## 2019-11-09 DIAGNOSIS — K219 Gastro-esophageal reflux disease without esophagitis: Secondary | ICD-10-CM | POA: Diagnosis not present

## 2019-11-09 DIAGNOSIS — Z7952 Long term (current) use of systemic steroids: Secondary | ICD-10-CM | POA: Diagnosis not present

## 2019-11-09 DIAGNOSIS — F419 Anxiety disorder, unspecified: Secondary | ICD-10-CM | POA: Diagnosis not present

## 2019-11-09 DIAGNOSIS — R000 Tachycardia, unspecified: Secondary | ICD-10-CM | POA: Diagnosis not present

## 2019-11-09 DIAGNOSIS — F32A Depression, unspecified: Secondary | ICD-10-CM | POA: Diagnosis not present

## 2019-11-09 DIAGNOSIS — D696 Thrombocytopenia, unspecified: Secondary | ICD-10-CM | POA: Diagnosis not present

## 2019-11-09 DIAGNOSIS — E785 Hyperlipidemia, unspecified: Secondary | ICD-10-CM | POA: Diagnosis not present

## 2019-11-09 DIAGNOSIS — I2510 Atherosclerotic heart disease of native coronary artery without angina pectoris: Secondary | ICD-10-CM | POA: Diagnosis not present

## 2019-11-09 DIAGNOSIS — M170 Bilateral primary osteoarthritis of knee: Secondary | ICD-10-CM | POA: Diagnosis not present

## 2019-11-09 DIAGNOSIS — M5126 Other intervertebral disc displacement, lumbar region: Secondary | ICD-10-CM | POA: Diagnosis not present

## 2019-11-09 DIAGNOSIS — N400 Enlarged prostate without lower urinary tract symptoms: Secondary | ICD-10-CM | POA: Diagnosis not present

## 2019-11-09 DIAGNOSIS — U071 COVID-19: Secondary | ICD-10-CM | POA: Diagnosis not present

## 2019-11-09 DIAGNOSIS — D61818 Other pancytopenia: Secondary | ICD-10-CM | POA: Diagnosis not present

## 2019-11-09 DIAGNOSIS — I700 Atherosclerosis of aorta: Secondary | ICD-10-CM | POA: Diagnosis not present

## 2019-11-09 DIAGNOSIS — K449 Diaphragmatic hernia without obstruction or gangrene: Secondary | ICD-10-CM | POA: Diagnosis not present

## 2019-11-09 DIAGNOSIS — A4189 Other specified sepsis: Secondary | ICD-10-CM | POA: Diagnosis not present

## 2019-11-09 DIAGNOSIS — J9811 Atelectasis: Secondary | ICD-10-CM | POA: Diagnosis not present

## 2019-11-09 DIAGNOSIS — D630 Anemia in neoplastic disease: Secondary | ICD-10-CM | POA: Diagnosis not present

## 2019-11-09 DIAGNOSIS — I119 Hypertensive heart disease without heart failure: Secondary | ICD-10-CM | POA: Diagnosis not present

## 2019-11-09 DIAGNOSIS — Z792 Long term (current) use of antibiotics: Secondary | ICD-10-CM | POA: Diagnosis not present

## 2019-11-09 DIAGNOSIS — K2980 Duodenitis without bleeding: Secondary | ICD-10-CM | POA: Diagnosis not present

## 2019-11-09 DIAGNOSIS — Z452 Encounter for adjustment and management of vascular access device: Secondary | ICD-10-CM | POA: Diagnosis not present

## 2019-11-09 DIAGNOSIS — C9000 Multiple myeloma not having achieved remission: Secondary | ICD-10-CM | POA: Diagnosis not present

## 2019-11-09 DIAGNOSIS — Z981 Arthrodesis status: Secondary | ICD-10-CM | POA: Diagnosis not present

## 2019-11-09 DIAGNOSIS — K2270 Barrett's esophagus without dysplasia: Secondary | ICD-10-CM | POA: Diagnosis not present

## 2019-11-11 ENCOUNTER — Other Ambulatory Visit: Payer: PPO

## 2019-11-11 ENCOUNTER — Telehealth: Payer: Self-pay | Admitting: *Deleted

## 2019-11-11 ENCOUNTER — Inpatient Hospital Stay: Payer: PPO

## 2019-11-11 ENCOUNTER — Inpatient Hospital Stay: Payer: PPO | Attending: Hematology & Oncology

## 2019-11-11 ENCOUNTER — Other Ambulatory Visit: Payer: Self-pay | Admitting: *Deleted

## 2019-11-11 ENCOUNTER — Other Ambulatory Visit: Payer: Self-pay

## 2019-11-11 ENCOUNTER — Ambulatory Visit: Payer: PPO | Admitting: Family

## 2019-11-11 ENCOUNTER — Telehealth: Payer: Self-pay | Admitting: Family

## 2019-11-11 ENCOUNTER — Encounter: Payer: Self-pay | Admitting: Family

## 2019-11-11 ENCOUNTER — Inpatient Hospital Stay (HOSPITAL_BASED_OUTPATIENT_CLINIC_OR_DEPARTMENT_OTHER): Payer: PPO | Admitting: Family

## 2019-11-11 VITALS — BP 145/76 | HR 86 | Temp 98.4°F | Resp 17

## 2019-11-11 VITALS — BP 98/53 | HR 93 | Temp 98.6°F | Resp 18 | Ht 65.0 in

## 2019-11-11 DIAGNOSIS — C9000 Multiple myeloma not having achieved remission: Secondary | ICD-10-CM

## 2019-11-11 DIAGNOSIS — D696 Thrombocytopenia, unspecified: Secondary | ICD-10-CM

## 2019-11-11 DIAGNOSIS — D701 Agranulocytosis secondary to cancer chemotherapy: Secondary | ICD-10-CM | POA: Insufficient documentation

## 2019-11-11 DIAGNOSIS — M546 Pain in thoracic spine: Secondary | ICD-10-CM

## 2019-11-11 DIAGNOSIS — D649 Anemia, unspecified: Secondary | ICD-10-CM

## 2019-11-11 DIAGNOSIS — R399 Unspecified symptoms and signs involving the genitourinary system: Secondary | ICD-10-CM | POA: Diagnosis not present

## 2019-11-11 DIAGNOSIS — D508 Other iron deficiency anemias: Secondary | ICD-10-CM

## 2019-11-11 DIAGNOSIS — C9002 Multiple myeloma in relapse: Secondary | ICD-10-CM | POA: Diagnosis not present

## 2019-11-11 DIAGNOSIS — Z9481 Bone marrow transplant status: Secondary | ICD-10-CM

## 2019-11-11 LAB — CMP (CANCER CENTER ONLY)
ALT: 18 U/L (ref 0–44)
AST: 28 U/L (ref 15–41)
Albumin: 2.9 g/dL — ABNORMAL LOW (ref 3.5–5.0)
Alkaline Phosphatase: 51 U/L (ref 38–126)
Anion gap: 15 (ref 5–15)
BUN: 30 mg/dL — ABNORMAL HIGH (ref 8–23)
CO2: 27 mmol/L (ref 22–32)
Calcium: 9.9 mg/dL (ref 8.9–10.3)
Chloride: 97 mmol/L — ABNORMAL LOW (ref 98–111)
Creatinine: 1.33 mg/dL — ABNORMAL HIGH (ref 0.61–1.24)
GFR, Estimated: 50 mL/min — ABNORMAL LOW (ref >60)
Glucose, Bld: 109 mg/dL — ABNORMAL HIGH (ref 70–99)
Potassium: 3.7 mmol/L (ref 3.5–5.1)
Sodium: 139 mmol/L (ref 135–145)
Total Bilirubin: 0.5 mg/dL (ref 0.3–1.2)
Total Protein: 8.5 g/dL — ABNORMAL HIGH (ref 6.5–8.1)

## 2019-11-11 LAB — URINALYSIS, COMPLETE (UACMP) WITH MICROSCOPIC
Bilirubin Urine: NEGATIVE
Glucose, UA: NEGATIVE mg/dL
Hgb urine dipstick: NEGATIVE
Ketones, ur: NEGATIVE mg/dL
Leukocytes,Ua: NEGATIVE
Nitrite: NEGATIVE
Protein, ur: 30 mg/dL — AB
Specific Gravity, Urine: 1.01 (ref 1.005–1.030)
pH: 6.5 (ref 5.0–8.0)

## 2019-11-11 LAB — CBC WITH DIFFERENTIAL (CANCER CENTER ONLY)
Abs Immature Granulocytes: 0.15 10*3/uL — ABNORMAL HIGH (ref 0.00–0.07)
Basophils Absolute: 0 10*3/uL (ref 0.0–0.1)
Basophils Relative: 0 %
Eosinophils Absolute: 0 10*3/uL (ref 0.0–0.5)
Eosinophils Relative: 0 %
HCT: 16.6 % — ABNORMAL LOW (ref 39.0–52.0)
Hemoglobin: 5.3 g/dL — CL (ref 13.0–17.0)
Immature Granulocytes: 9 %
Lymphocytes Relative: 16 %
Lymphs Abs: 0.3 10*3/uL — ABNORMAL LOW (ref 0.7–4.0)
MCH: 30.6 pg (ref 26.0–34.0)
MCHC: 31.9 g/dL (ref 30.0–36.0)
MCV: 96 fL (ref 80.0–100.0)
Monocytes Absolute: 0.1 10*3/uL (ref 0.1–1.0)
Monocytes Relative: 6 %
Neutro Abs: 1.2 10*3/uL — ABNORMAL LOW (ref 1.7–7.7)
Neutrophils Relative %: 69 %
Platelet Count: 11 10*3/uL — ABNORMAL LOW (ref 150–400)
RBC: 1.73 MIL/uL — ABNORMAL LOW (ref 4.22–5.81)
RDW: 19.8 % — ABNORMAL HIGH (ref 11.5–15.5)
WBC Count: 1.7 10*3/uL — ABNORMAL LOW (ref 4.0–10.5)
nRBC: 1.2 % — ABNORMAL HIGH (ref 0.0–0.2)

## 2019-11-11 LAB — PREPARE RBC (CROSSMATCH)

## 2019-11-11 LAB — LACTATE DEHYDROGENASE: LDH: 516 U/L — ABNORMAL HIGH (ref 98–192)

## 2019-11-11 LAB — SAMPLE TO BLOOD BANK

## 2019-11-11 MED ORDER — MIRTAZAPINE 7.5 MG PO TABS: 7.5000 mg | ORAL_TABLET | Freq: Every day | ORAL | 1 refills | Status: AC

## 2019-11-11 MED ORDER — TBO-FILGRASTIM 480 MCG/0.8ML ~~LOC~~ SOSY
480.0000 ug | PREFILLED_SYRINGE | Freq: Once | SUBCUTANEOUS | Status: AC
  Administered 2019-11-11: 480 ug via SUBCUTANEOUS
  Filled 2019-11-11: qty 0.8

## 2019-11-11 MED ORDER — DIPHENHYDRAMINE HCL 25 MG PO CAPS: 25.0000 mg | ORAL_CAPSULE | Freq: Once | ORAL | Status: DC

## 2019-11-11 MED ORDER — ACETAMINOPHEN 325 MG PO TABS
ORAL_TABLET | ORAL | Status: AC
  Filled 2019-11-11: qty 2

## 2019-11-11 MED ORDER — SODIUM CHLORIDE 0.9% IV SOLUTION
250.0000 mL | Freq: Once | INTRAVENOUS | Status: AC
  Filled 2019-11-11: qty 250

## 2019-11-11 MED ORDER — SODIUM CHLORIDE 0.9% FLUSH
10.0000 mL | INTRAVENOUS | Status: AC | PRN
  Administered 2019-11-11: 10 mL
  Filled 2019-11-11: qty 10

## 2019-11-11 MED ORDER — SODIUM CHLORIDE 0.9 % IV SOLN
Freq: Once | INTRAVENOUS | Status: AC
  Filled 2019-11-11: qty 250

## 2019-11-11 MED ORDER — HEPARIN SOD (PORK) LOCK FLUSH 100 UNIT/ML IV SOLN
500.0000 [IU] | Freq: Every day | INTRAVENOUS | Status: AC | PRN
  Administered 2019-11-11: 500 [IU]
  Filled 2019-11-11: qty 5

## 2019-11-11 MED ORDER — DIPHENHYDRAMINE HCL 25 MG PO CAPS
ORAL_CAPSULE | ORAL | Status: AC
  Filled 2019-11-11: qty 1

## 2019-11-11 NOTE — Patient Instructions (Signed)
Implanted Port Insertion, Care After °This sheet gives you information about how to care for yourself after your procedure. Your health care provider may also give you more specific instructions. If you have problems or questions, contact your health care provider. °What can I expect after the procedure? °After the procedure, it is common to have: °· Discomfort at the port insertion site. °· Bruising on the skin over the port. This should improve over 3-4 days. °Follow these instructions at home: °Port care °· After your port is placed, you will get a manufacturer's information card. The card has information about your port. Keep this card with you at all times. °· Take care of the port as told by your health care provider. Ask your health care provider if you or a family member can get training for taking care of the port at home. A home health care nurse may also take care of the port. °· Make sure to remember what type of port you have. °Incision care ° °  ° °· Follow instructions from your health care provider about how to take care of your port insertion site. Make sure you: °? Wash your hands with soap and water before and after you change your bandage (dressing). If soap and water are not available, use hand sanitizer. °? Change your dressing as told by your health care provider. °? Leave stitches (sutures), skin glue, or adhesive strips in place. These skin closures may need to stay in place for 2 weeks or longer. If adhesive strip edges start to loosen and curl up, you may trim the loose edges. Do not remove adhesive strips completely unless your health care provider tells you to do that. °· Check your port insertion site every day for signs of infection. Check for: °? Redness, swelling, or pain. °? Fluid or blood. °? Warmth. °? Pus or a bad smell. °Activity °· Return to your normal activities as told by your health care provider. Ask your health care provider what activities are safe for you. °· Do not  lift anything that is heavier than 10 lb (4.5 kg), or the limit that you are told, until your health care provider says that it is safe. °General instructions °· Take over-the-counter and prescription medicines only as told by your health care provider. °· Do not take baths, swim, or use a hot tub until your health care provider approves. Ask your health care provider if you may take showers. You may only be allowed to take sponge baths. °· Do not drive for 24 hours if you were given a sedative during your procedure. °· Wear a medical alert bracelet in case of an emergency. This will tell any health care providers that you have a port. °· Keep all follow-up visits as told by your health care provider. This is important. °Contact a health care provider if: °· You cannot flush your port with saline as directed, or you cannot draw blood from the port. °· You have a fever or chills. °· You have redness, swelling, or pain around your port insertion site. °· You have fluid or blood coming from your port insertion site. °· Your port insertion site feels warm to the touch. °· You have pus or a bad smell coming from the port insertion site. °Get help right away if: °· You have chest pain or shortness of breath. °· You have bleeding from your port that you cannot control. °Summary °· Take care of the port as told by your health   care provider. Keep the manufacturer's information card with you at all times. °· Change your dressing as told by your health care provider. °· Contact a health care provider if you have a fever or chills or if you have redness, swelling, or pain around your port insertion site. °· Keep all follow-up visits as told by your health care provider. °This information is not intended to replace advice given to you by your health care provider. Make sure you discuss any questions you have with your health care provider. °Document Revised: 08/06/2017 Document Reviewed: 08/06/2017 °Elsevier Patient Education ©  2020 Elsevier Inc. ° °

## 2019-11-11 NOTE — Telephone Encounter (Signed)
Chauncey Cruel [place] NP notified of WBC-1.7, HGB-5.3, ANC-1.2 and platelets-11.  Orders received for pt to get two units of PRBC's and one unit of platelets today per S. [place] NP.

## 2019-11-11 NOTE — Telephone Encounter (Signed)
Appointments scheduled as per 11/11/19 los.  Patients wife was given updated calendar

## 2019-11-11 NOTE — Progress Notes (Signed)
Hematology and Oncology Follow Up Visit  Gene Hill [id number] 1940/06/20 79 y.o. 11/11/2019   Principle Diagnosis:  IgA kappa myeloma - relapsed post ASCT (+4, +11, 13q- and 17p-)  Past Therapy: Pomalyst stopped 12/25/2016 Daratumumab q 4 week dosing -changed on 10/28/2017 - s/p cycle 35  Carfilzomib -- s/p cycle #1 on 04/07/2019 (3 wk on/1 off) Selinexor 100 mg po q day - start 05/25/2019 -- d/c on 06/29/2019 Velcade 1.3 mg/m2 sq q week -- start on 05/03/202 - d/c on 06/2019 Melflufen 40 mg IV q month -status post cycle #1 - started on 08/24/2019 -- d/coc on 10/06/2019  Current Therapy: Bendamustine 80 mg/m2 day 1,2 -- started 10/13/2019 - s/p cycle  Xgeva 120 m sq q 3 months - next dose 12/2019   Interim History:  Gene Hill is here today with his wife for follow-up and treatment. He is incredibly weak and fatigued at this time. No s/s of distress.  He has had a low grade temp off and on at home. He is afebrile at this time.  He is staying in bed at home with minimal walking at home with a walker.  No falls or syncopal episodes to report.  He notes mild SOB with over exertion and rests as needed.  Positional pain in the left hip if he lays too long.  No chills, n/v, cough, rash, dizziness, chest pain, palpitations, abdominal pain or changes in bowel habits.  He has had more frequent urinary incontinence and is wearing his adult briefs.  He has a few scratches on his hands that have bled but he was able to get them to clot quickly. No other blood loss noted. No petechiae.  No appetite but he is doing his best to hydrated. He was unable to stand for weight today.   ECOG Performance Status: 3 - Symptomatic, >50% confined to bed  Medications:  Allergies as of 11/11/2019   No Known Allergies     Medication List       Accurate as of November 11, 2019  8:56 AM. If you have any questions, ask your nurse or doctor.        STOP taking these  medications   predniSONE 10 MG tablet Commonly known as: DELTASONE Stopped by: [name], NP     TAKE these medications   acetaminophen 500 MG tablet Commonly known as: TYLENOL Take 1,000 mg by mouth 3 (three) times daily as needed (pain).   ascorbic acid 500 MG tablet Commonly known as: VITAMIN C Take 1 tablet (500 mg total) by mouth daily.   b complex vitamins tablet Take 1 tablet by mouth daily.   calcium carbonate 1500 (600 Ca) MG Tabs tablet Commonly known as: OSCAL Take 600 mg of elemental calcium by mouth daily with breakfast.   dronabinol 2.5 MG capsule Commonly known as: MARINOL TAKE 1 CAPSULE(2.5 MG) BY MOUTH TWICE DAILY BEFORE A MEAL What changed:   how much to take  how to take this  when to take this  additional instructions   famciclovir 250 MG tablet Commonly known as: FAMVIR Take 1 tablet (250 mg total) by mouth daily.   Fish Oil 1200 MG Caps Take 1,200 mg by mouth daily.   LORazepam 0.5 MG tablet Commonly known as: ATIVAN Take 0.5 mg by mouth every 6 (six) hours as needed (nausea/vomiting).   methocarbamol 500 MG tablet Commonly known as: ROBAXIN Take 1 tablet (500 mg total) by mouth 3 (three) times daily as needed for muscle  spasms.   metoprolol tartrate 50 MG tablet Commonly known as: LOPRESSOR Take 1 tablet (50 mg total) by mouth 2 (two) times daily.   mirtazapine 7.5 MG tablet Commonly known as: REMERON Take 1 tablet (7.5 mg total) by mouth at bedtime.   multivitamin tablet Take 1 tablet by mouth daily.   ondansetron 8 MG tablet Commonly known as: ZOFRAN Take 8 mg by mouth every 8 (eight) hours as needed for nausea or vomiting.   OVER THE COUNTER MEDICATION Take 2.5 mg by mouth daily. Marijuana (Appetite stimulant)   pantoprazole 40 MG tablet Commonly known as: PROTONIX Take 1 tablet (40 mg total) by mouth at bedtime.   PROBIOTIC PO Take 1 tablet by mouth daily.   tamsulosin 0.4 MG Caps capsule Commonly known as:  FLOMAX Take 0.4 mg by mouth at bedtime.   traMADol 50 MG tablet Commonly known as: ULTRAM Take 1 tablet (50 mg total) by mouth every 6 (six) hours as needed. 90 day supply please   VITAMIN B-12 PO Take 5,000 mcg by mouth daily.   zinc sulfate 220 (50 Zn) MG capsule Take 1 capsule (220 mg total) by mouth daily.       Allergies: No Known Allergies  Past Medical History, Surgical history, Social history, and Family History were reviewed and updated.  Review of Systems: All other 10 point review of systems is negative.   Physical Exam:  height is 5\' 5"  (1.651 m). His oral temperature is 98.6 F (37 C). His blood pressure is 98/53 (abnormal) and his pulse is 93. His respiration is 18 and oxygen saturation is 94%.   Wt Readings from Last 3 Encounters:  11/03/19 190 lb (86.2 kg)  10/28/19 210 lb 8.6 oz (95.5 kg)  10/13/19 191 lb 6.4 oz (86.8 kg)    Ocular: Sclerae unicteric, pupils equal, round and reactive to light Ear-nose-throat: Oropharynx clear, dentition fair Lymphatic: No cervical or supraclavicular adenopathy Lungs no rales or rhonchi, good excursion bilaterally Heart regular rate and rhythm, no murmur appreciated Abd soft, nontender, positive bowel sounds, no liver or spleen tip palpated on exam, no fluid wave  MSK no focal spinal tenderness, no joint edema Neuro: non-focal, well-oriented, appropriate affect Breasts: Deferred   Lab Results  Component Value Date   WBC 1.7 (L) 11/11/2019   HGB 5.3 (LL) 11/11/2019   HCT 16.6 (L) 11/11/2019   MCV 96.0 11/11/2019   PLT 11 (L) 11/11/2019   Lab Results  Component Value Date   FERRITIN 2,353 (H) 10/28/2019   IRON 195 (H) 07/21/2019   TIBC 235 07/21/2019   UIBC 40 (L) 07/21/2019   IRONPCTSAT 83 (H) 07/21/2019   Lab Results  Component Value Date   RETICCTPCT 2.1 09/21/2019   RBC 1.73 (L) 11/11/2019   Lab Results  Component Value Date   KPAFRELGTCHN 2,535.9 (H) 10/13/2019   LAMBDASER 4.7 (L) 10/13/2019    KAPLAMBRATIO 539.55 (H) 10/13/2019   Lab Results  Component Value Date   IGGSERUM 99 (L) 10/13/2019   IGGSERUM 92 (L) 10/13/2019   IGA 2,939 (H) 10/13/2019   IGA 2,853 (H) 10/13/2019   IGMSERUM <5 (L) 10/13/2019   IGMSERUM <5 (L) 10/13/2019   Lab Results  Component Value Date   TOTALPROTELP 7.8 10/13/2019   ALBUMINELP 3.4 10/13/2019   A1GS 0.2 10/13/2019   A2GS 0.8 10/13/2019   BETS 1.2 10/13/2019   BETA2SER 2.0 (H) 12/08/2014   GAMS 2.1 (H) 10/13/2019   MSPIKE 1.0 (H) 10/13/2019   SPEI Comment 06/30/2019  Chemistry      Component Value Date/Time   NA 141 11/02/2019 0000   NA 141 01/21/2017 0741   NA 139 06/14/2016 1246   K 3.6 11/02/2019 0000   K 4.0 01/21/2017 0741   K 3.8 06/14/2016 1246   CL 101 11/02/2019 0000   CL 103 01/21/2017 0741   CO2 24 (A) 11/02/2019 0000   CO2 30 01/21/2017 0741   CO2 24 06/14/2016 1246   BUN 17 11/02/2019 0000   BUN 20 01/21/2017 0741   BUN 15.0 06/14/2016 1246   CREATININE 1.0 11/02/2019 0000   CREATININE 0.77 10/28/2019 0330   CREATININE 1.13 10/20/2019 0950   CREATININE 1.0 01/21/2017 0741   CREATININE 0.9 06/14/2016 1246   GLU 170 11/02/2019 0000      Component Value Date/Time   CALCIUM 8.7 11/02/2019 0000   CALCIUM 9.0 01/21/2017 0741   CALCIUM 9.6 06/14/2016 1246   ALKPHOS 52 11/02/2019 0000   ALKPHOS 37 01/21/2017 0741   ALKPHOS 54 06/14/2016 1246   AST 27 11/02/2019 0000   AST 19 10/20/2019 0950   AST 25 06/14/2016 1246   ALT 28 11/02/2019 0000   ALT 11 10/20/2019 0950   ALT 29 01/21/2017 0741   ALT 21 06/14/2016 1246   BILITOT 0.8 10/28/2019 0330   BILITOT 0.7 10/20/2019 0950   BILITOT 0.37 06/14/2016 1246       Impression and Plan: Mr. Nazar is a very pleasant 79 yo caucasian gentleman with history of IgA kappa myeloma,high risk cytogenetics with a 17p- abnormality.  We will hold treatment today and give him 2 units of blood, 1 unit of platelets and Granix for his low counts.  We will also get a  urine specimen on him today to assess for UTI.  Both he and his wife are in agreement with the plan.  His wife Lelan Pons took me to the side to let me know they are ok with stopping treatment.  He has an appointment with Dr. Marin Olp next week and I have discussed the above with him.  The were encouraged to contact our office with any questions or concerns.   [name], NP 10/20/20218:56 AM

## 2019-11-11 NOTE — Patient Instructions (Signed)
Thrombocytopenia Thrombocytopenia means that you have a low number of platelets in your blood. Platelets are tiny cells in the blood. When you bleed, they clump together at the cut or injury to stop the bleeding. This is called blood clotting. If you do not have enough platelets, it can cause bleeding problems. Some cases of this condition are mild while others are more severe. What are the causes? This condition may be caused by:  Your body not making enough platelets. This may be caused by: ? Your bone marrow not making blood cells (aplastic anemia). ? Cancer in the bone marrow. ? Certain medicines. ? Infection in the bone marrow. ? Drinking a lot of alcohol.  Your body destroying platelets too quickly. This may be caused by: ? Certain immune diseases. ? Certain medicines. ? Certain blood clotting disorders. ? Certain disorders that are passed from parent to child (inherited). ? Certain bleeding disorders. ? Pregnancy. ? Having a spleen that is larger than normal. What are the signs or symptoms?  Bleeding that is not normal.  Nosebleeds.  Heavy menstrual periods.  Blood in the pee (urine) or poop (stool).  A purple-like color to the skin (purpura).  Bruising.  A rash that looks like pinpoint, purple-red spots (petechiae). How is this treated?  Treatment of another condition that is causing the low platelet count.  Medicines to help protect your platelets from being destroyed.  A replacement (transfusion) of platelets to stop or prevent bleeding.  Surgery to remove the spleen. Follow these instructions at home: Activity  Avoid activities that could cause you to get hurt or bruised. Follow instructions about how to prevent falls.  Take care not to cut yourself: ? When you shave. ? When you use scissors, needles, knives, or other tools.  Take care not to burn yourself: ? When you use an iron. ? When you cook. General instructions   Check your skin and the  inside of your mouth for bruises or blood as told by your doctor.  Check to see if there is blood in your spit (sputum), pee, and poop. Do this as told by your doctor.  Do not drink alcohol.  Take over-the-counter and prescription medicines only as told by your doctor.  Do not take any medicines that have aspirin or NSAIDs in them. These medicines can thin your blood and cause you to bleed.  Tell all of your doctors that you have this condition. Be sure to tell your dentist and eye doctor too. Contact a doctor if:  You have bruises and you do not know why. Get help right away if:  You are bleeding anywhere on your body.  You have blood in your spit, pee, or poop. Summary  Thrombocytopenia means that you have a low number of platelets in your blood.  Platelets are needed for blood clotting.  Symptoms of this condition include bleeding that is not normal, and bruising.  Take care not to cut or burn yourself. This information is not intended to replace advice given to you by your health care provider. Make sure you discuss any questions you have with your health care provider. Document Revised: 10/10/2017 Document Reviewed: 10/10/2017 Elsevier Patient Education  [place]. Anemia  Anemia is a condition in which you do not have enough red blood cells or hemoglobin. Hemoglobin is a substance in red blood cells that carries oxygen. When you do not have enough red blood cells or hemoglobin (are anemic), your body cannot get enough oxygen and  your organs may not work properly. As a result, you may feel very tired or have other problems. What are the causes? Common causes of anemia include:  Excessive bleeding. Anemia can be caused by excessive bleeding inside or outside the body, including bleeding from the intestine or from periods in women.  Poor nutrition.  Long-lasting (chronic) kidney, thyroid, and liver disease.  Bone marrow disorders.  Cancer and treatments for  cancer.  HIV (human immunodeficiency virus) and AIDS (acquired immunodeficiency syndrome).  Treatments for HIV and AIDS.  Spleen problems.  Blood disorders.  Infections, medicines, and autoimmune disorders that destroy red blood cells. What are the signs or symptoms? Symptoms of this condition include:  Minor weakness.  Dizziness.  Headache.  Feeling heartbeats that are irregular or faster than normal (palpitations).  Shortness of breath, especially with exercise.  Paleness.  Cold sensitivity.  Indigestion.  Nausea.  Difficulty sleeping.  Difficulty concentrating. Symptoms may occur suddenly or develop slowly. If your anemia is mild, you may not have symptoms. How is this diagnosed? This condition is diagnosed based on:  Blood tests.  Your medical history.  A physical exam.  Bone marrow biopsy. Your health care provider may also check your stool (feces) for blood and may do additional testing to look for the cause of your bleeding. You may also have other tests, including:  Imaging tests, such as a CT scan or MRI.  Endoscopy.  Colonoscopy. How is this treated? Treatment for this condition depends on the cause. If you continue to lose a lot of blood, you may need to be treated at a hospital. Treatment may include:  Taking supplements of iron, vitamin E31, or folic acid.  Taking a hormone medicine (erythropoietin) that can help to stimulate red blood cell growth.  Having a blood transfusion. This may be needed if you lose a lot of blood.  Making changes to your diet.  Having surgery to remove your spleen. Follow these instructions at home:  Take over-the-counter and prescription medicines only as told by your health care provider.  Take supplements only as told by your health care provider.  Follow any diet instructions that you were given.  Keep all follow-up visits as told by your health care provider. This is important. Contact a health care  provider if:  You develop new bleeding anywhere in the body. Get help right away if:  You are very weak.  You are short of breath.  You have pain in your abdomen or chest.  You are dizzy or feel faint.  You have trouble concentrating.  You have bloody or black, tarry stools.  You vomit repeatedly or you vomit up blood. Summary  Anemia is a condition in which you do not have enough red blood cells or enough of a substance in your red blood cells that carries oxygen (hemoglobin).  Symptoms may occur suddenly or develop slowly.  If your anemia is mild, you may not have symptoms.  This condition is diagnosed with blood tests as well as a medical history and physical exam. Other tests may be needed.  Treatment for this condition depends on the cause of the anemia. This information is not intended to replace advice given to you by your health care provider. Make sure you discuss any questions you have with your health care provider. Document Revised: 12/21/2016 Document Reviewed: 02/10/2016 Elsevier Patient Education  [place].

## 2019-11-12 LAB — TYPE AND SCREEN
ABO/RH(D): A POS
Antibody Screen: NEGATIVE
Unit division: 0
Unit division: 0

## 2019-11-12 LAB — BPAM PLATELET PHERESIS
Blood Product Expiration Date: 202110222359
ISSUE DATE / TIME: 202110200914
Unit Type and Rh: 6200

## 2019-11-12 LAB — PREPARE PLATELET PHERESIS: Unit division: 0

## 2019-11-12 LAB — BPAM RBC
Blood Product Expiration Date: 202111032359
Blood Product Expiration Date: 202111062359
ISSUE DATE / TIME: 202110201119
ISSUE DATE / TIME: 202110201119
Unit Type and Rh: 6200
Unit Type and Rh: 6200

## 2019-11-12 LAB — URINE CULTURE: Culture: NO GROWTH

## 2019-11-12 LAB — KAPPA/LAMBDA LIGHT CHAINS
Kappa free light chain: 7988.7 mg/L — ABNORMAL HIGH (ref 3.3–19.4)
Kappa, lambda light chain ratio: 649.49 — ABNORMAL HIGH (ref 0.26–1.65)
Lambda free light chains: 12.3 mg/L (ref 5.7–26.3)

## 2019-11-12 LAB — IGG, IGA, IGM
IgA: 3258 mg/dL — ABNORMAL HIGH (ref 61–437)
IgG (Immunoglobin G), Serum: 108 mg/dL — ABNORMAL LOW (ref 603–1613)
IgM (Immunoglobulin M), Srm: <5 mg/dL — ABNORMAL LOW (ref 15–143)

## 2019-11-16 ENCOUNTER — Telehealth: Payer: Self-pay | Admitting: *Deleted

## 2019-11-16 LAB — IMMUNOFIXATION REFLEX, SERUM
IgA: 4211 mg/dL — ABNORMAL HIGH (ref 61–437)
IgG (Immunoglobin G), Serum: 128 mg/dL — ABNORMAL LOW (ref 603–1613)
IgM (Immunoglobulin M), Srm: 8 mg/dL — ABNORMAL LOW (ref 15–143)

## 2019-11-16 LAB — PROTEIN ELECTROPHORESIS, SERUM, WITH REFLEX
A/G Ratio: 0.5 — ABNORMAL LOW (ref 0.7–1.7)
Albumin ELP: 2.6 g/dL — ABNORMAL LOW (ref 2.9–4.4)
Alpha-1-Globulin: 0.4 g/dL (ref 0.0–0.4)
Alpha-2-Globulin: 1.3 g/dL — ABNORMAL HIGH (ref 0.4–1.0)
Beta Globulin: 1 g/dL (ref 0.7–1.3)
Gamma Globulin: 2.8 g/dL — ABNORMAL HIGH (ref 0.4–1.8)
Globulin, Total: 5.5 g/dL — ABNORMAL HIGH (ref 2.2–3.9)
M-Spike, %: 2 g/dL — ABNORMAL HIGH
SPEP Interpretation: 0
Total Protein ELP: 8.1 g/dL (ref 6.0–8.5)

## 2019-11-16 NOTE — Telephone Encounter (Signed)
Call received from patient's wife stating that she would like hospice called for patient.  Dr. Marin Olp notified and [facility] called per order of Dr. Marin Olp.

## 2019-11-17 ENCOUNTER — Inpatient Hospital Stay: Payer: PPO | Admitting: Hematology & Oncology

## 2019-11-17 ENCOUNTER — Inpatient Hospital Stay: Payer: PPO

## 2019-11-18 ENCOUNTER — Inpatient Hospital Stay: Payer: PPO

## 2019-11-19 ENCOUNTER — Other Ambulatory Visit: Payer: PPO

## 2019-11-19 ENCOUNTER — Ambulatory Visit: Payer: PPO

## 2019-11-19 ENCOUNTER — Ambulatory Visit: Payer: PPO | Admitting: Hematology & Oncology

## 2019-11-20 ENCOUNTER — Other Ambulatory Visit: Payer: Self-pay | Admitting: Hematology & Oncology

## 2019-11-23 ENCOUNTER — Telehealth: Payer: Self-pay | Admitting: *Deleted

## 2019-11-23 ENCOUNTER — Encounter: Payer: Self-pay | Admitting: Internal Medicine

## 2019-11-23 ENCOUNTER — Telehealth: Payer: Self-pay | Admitting: Internal Medicine

## 2019-11-23 NOTE — Telephone Encounter (Signed)
Patient wife states patient passed yesterday.

## 2019-11-23 NOTE — Telephone Encounter (Signed)
See previous encounter notes.

## 2019-11-23 NOTE — Telephone Encounter (Signed)
Received a phone call from Hosmer at [facility] stating that Mr [id number] [facility] expired on Nov 28, 2019 at 1142pm in his home.   Dr Marin Olp notified.  Flowers sent to the family

## 2019-11-23 NOTE — Telephone Encounter (Signed)
I spoke with Ms. Florence, very sad but they had a very good life together. Condolences provided.

## 2019-11-23 DEATH — deceased

## 2020-04-04 ENCOUNTER — Encounter: Payer: Self-pay | Admitting: Hematology & Oncology

## 2020-04-04 NOTE — Progress Notes (Signed)
Patient's spouse called regarding bill received and copay assistance.  Advised I would send message to Baxter Flattery to review and contact her.  Secure chat/email sent to Tara at [facility] location.

## 2020-04-15 ENCOUNTER — Telehealth: Payer: Self-pay | Admitting: Hematology & Oncology

## 2020-04-15 NOTE — Telephone Encounter (Signed)
Patient Name: Gene Hill - Deceased  Patient DOB: November 19, 1940  Account: [id number]  Pt hosp acct [id number] bal $[id number] is being reprocessed with Patient [place] for claim exception due to being expired.   PAF claim proof of expenditure form and [facility] eob billing statement was faxed on: 04/07/2020.  Send payment submissions to:  [place] [facility] [address] [place] [phone]  Claim ID: [id number]  I spoke w Philana S today and she advised it is now being reviewed and  will take up to 3-5 days.  PAF ID: [id number] Valid Dates: 11/17/2019 - 01/16/2020 Award Amt: 11,000.00   P: [phone]  I spoke with Angeline Slim in billing and and she is excalating this issue to further review with her mgr.  I spoke with patient's spouse today and she is aware to be patient and wait for the outcome process at this time.

## 2021-02-03 IMAGING — MR MR ABDOMEN WO/W CM
18 series · 48 of 48 positions shown · IV contrast (gadavist)
Comparison: Noncontrast CT on 05/25/2019 from [REDACTED]

CLINICAL DATA: Indeterminate left renal lesion on recent
noncontrast CT.

EXAM:
MRI ABDOMEN WITHOUT AND WITH CONTRAST
TECHNIQUE: Multiplanar multisequence MR imaging of the abdomen was performed
both before and after the administration of intravenous contrast.
CONTRAST:  10mL GADAVIST GADOBUTROL 1 MMOL/ML IV SOLN

[Series 2: T2 · coronal · 6.0mm · 1.76mm/px · 2 of 38 slices shown (1 of 2)]
[im 1/38]
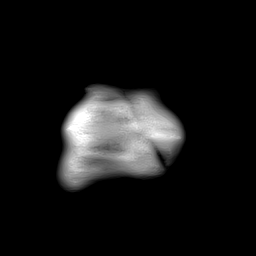
[im 38/38]
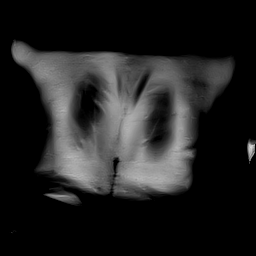

[Series 3: T2 fat-sat · axial · 6.0mm · 1.41mm/px · z∈[-171,+138]mm · 2 of 44 slices shown]
[im 1/44]
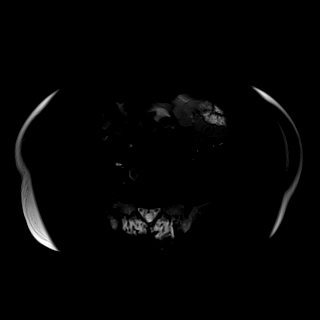
[im 44/44]
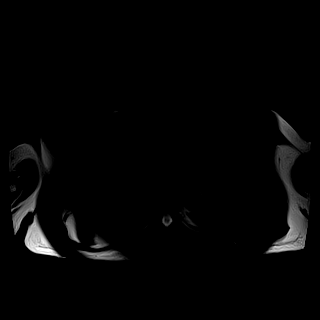

[Series 5: T1 · axial · 4.0mm · 1.41mm/px · z∈[-175,+141]mm · 4 of 80 slices shown (1 of 2)]
[im 1/80]
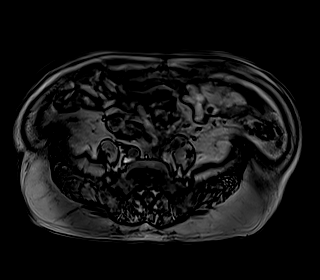
[im 27/80]
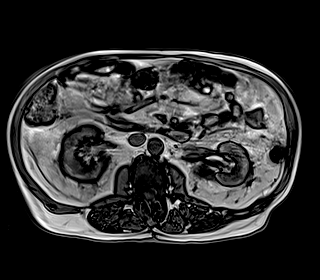
[im 53/80]
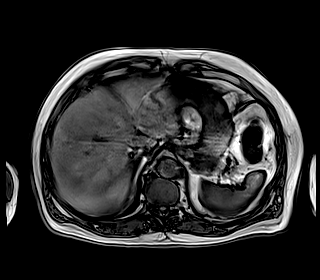
[im 80/80]
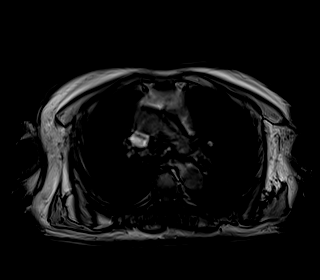

[Series 6: T1 · axial · 4.0mm · 1.41mm/px · z∈[-175,+141]mm · 4 of 80 slices shown (2 of 2)]
[im 1/80]
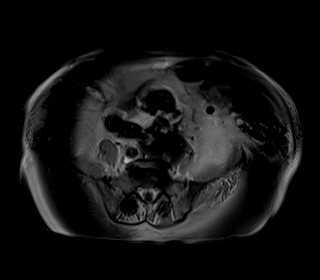
[im 27/80]
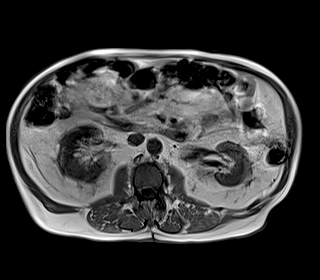
[im 53/80]
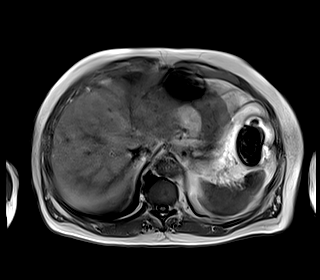
[im 80/80]
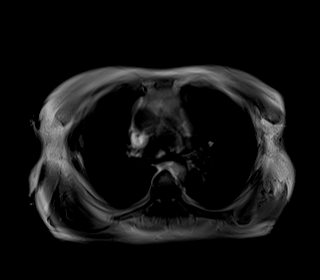

[Series 7: DWI · axial · 6.0mm · 1.68mm/px · z∈[-171,+138]mm · 3 of 88 slices shown (1 of 2)]
[im 1/88]
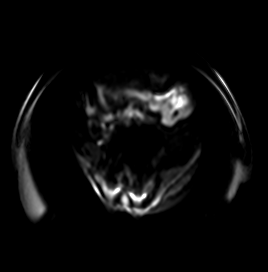
[im 44/88]
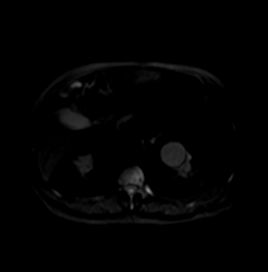
[im 88/88]
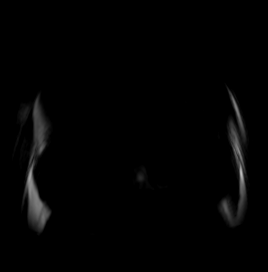

[Series 8: DWI · axial · 6.0mm · 1.68mm/px · 1 of 44 slices shown (2 of 2)]
[im 1/44]
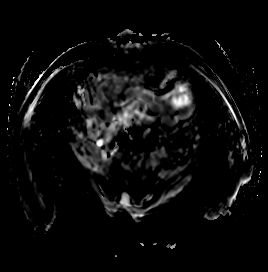

[Series 9: bSSFP · axial · 6.0mm · 0.88mm/px · z∈[-178,+145]mm · 2 of 50 slices shown]
[im 1/50]
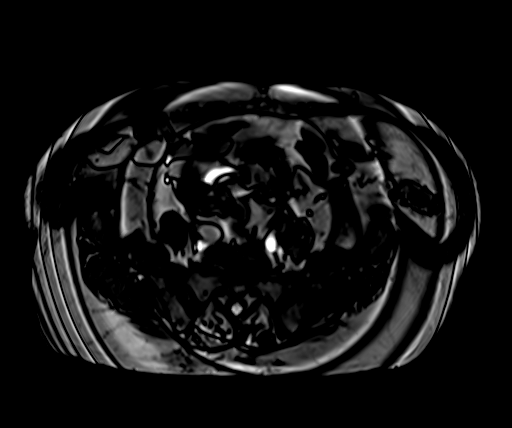
[im 50/50]
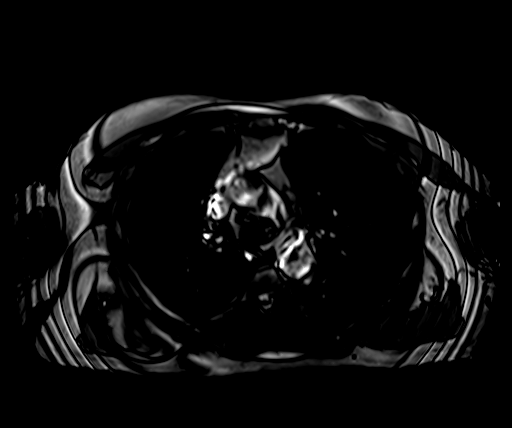

[Series 11: T1 dynamic · axial · 3.0mm · 1.41mm/px · z∈[-156,+153]mm · 3 of 104 slices shown (1 of 6)]
[im 1/104]
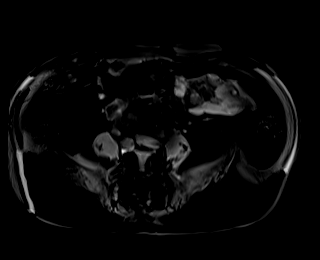
[im 52/104]
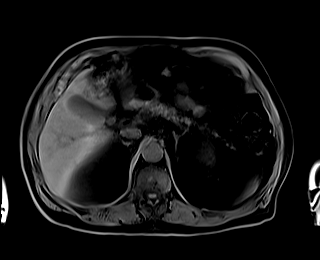
[im 104/104]
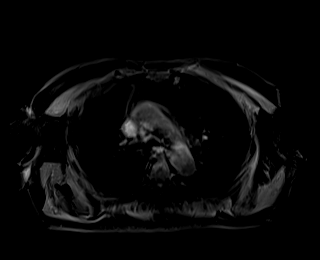

[Series 14: T1 dynamic · axial · 3.0mm · 1.41mm/px · z∈[-156,+153]mm · 3 of 104 slices shown (2 of 6)]
[im 1/104]
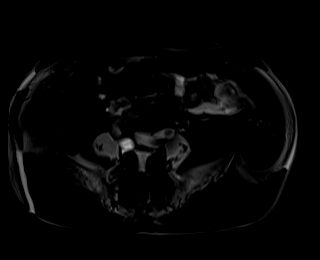
[im 52/104]
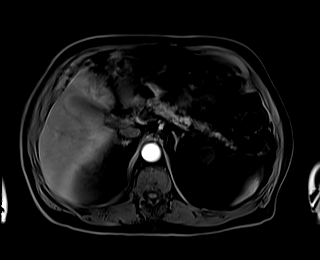
[im 104/104]
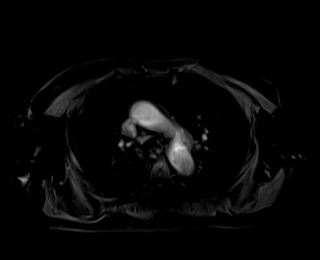

[Series 16: T1 dynamic · axial · 3.0mm · 1.41mm/px · z∈[-156,+153]mm · 3 of 104 slices shown (3 of 6)]
[im 1/104]
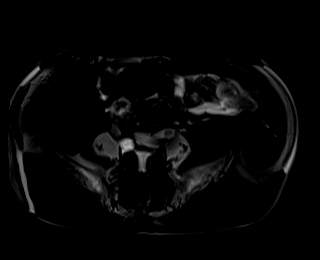
[im 52/104]
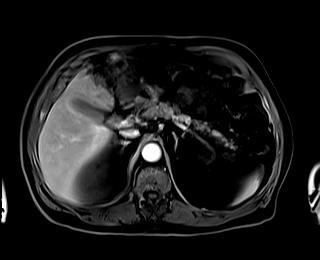
[im 104/104]
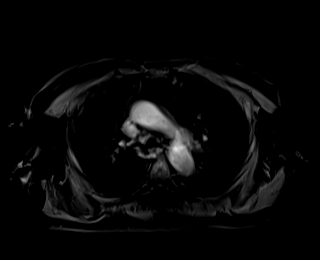

[Series 18: T1 dynamic · axial · 3.0mm · 1.41mm/px · z∈[-156,+153]mm · 3 of 104 slices shown (4 of 6)]
[im 1/104]
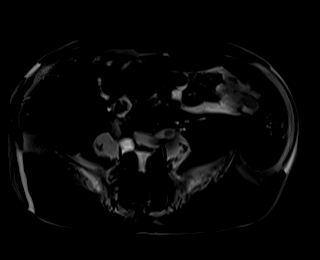
[im 52/104]
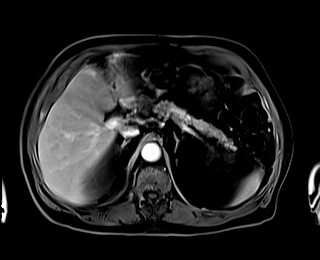
[im 104/104]
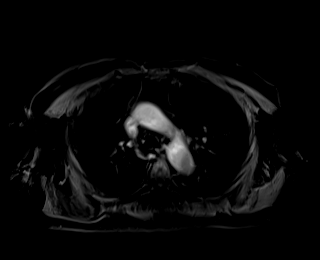

[Series 20: T1 dynamic · coronal · 4.5mm · 1.41mm/px · 2 of 60 slices shown (5 of 6)]
[im 1/60]
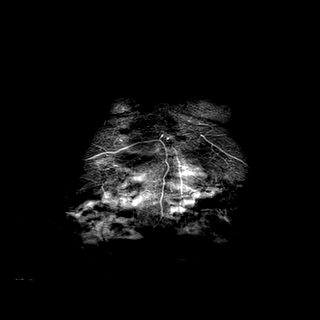
[im 60/60]
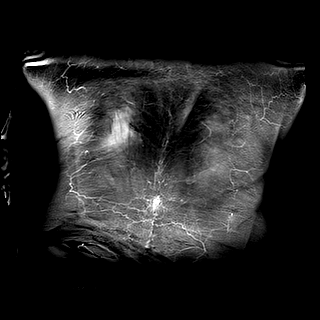

[Series 21: T2 · axial · 6.0mm · 1.76mm/px · 1 of 44 slices shown (2 of 2)]
[im 1/44]
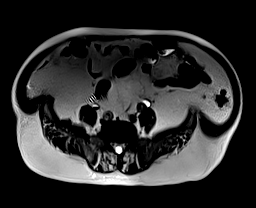

[Series 23: T1 dynamic · axial · 3.0mm · 1.41mm/px · z∈[-156,+153]mm · 3 of 104 slices shown (6 of 6)]
[im 1/104]
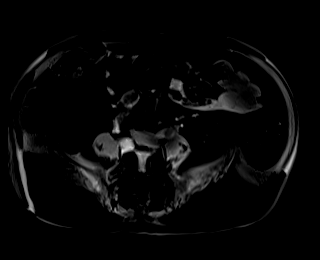
[im 52/104]
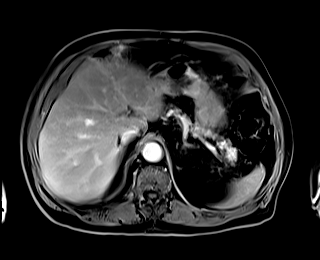
[im 104/104]
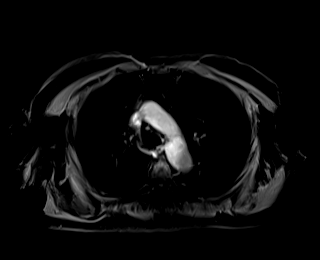

[Series 100: (id) · axial · 3.0mm · 1.41mm/px · z∈[-156,+153]mm · 3 of 104 slices shown]
[im 1/104]
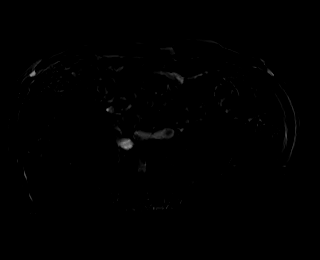
[im 52/104]
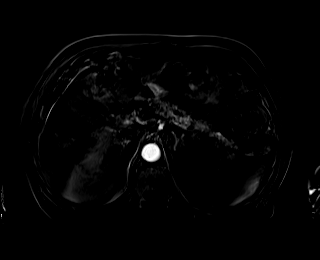
[im 104/104]
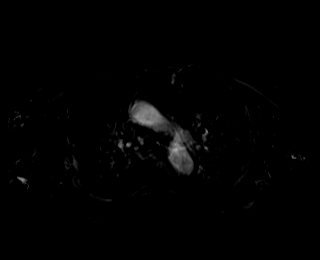

[Series 101: 45 sec · axial · 3.0mm · 1.41mm/px · z∈[-156,+153]mm · 3 of 104 slices shown]
[im 1/104]
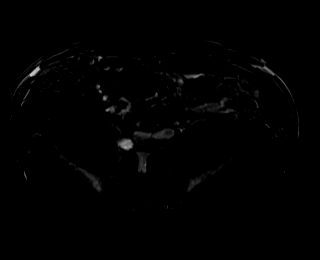
[im 52/104]
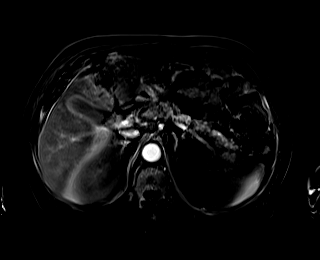
[im 104/104]
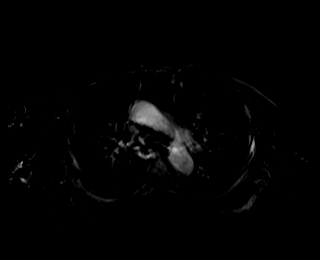

[Series 102: 90 sec · axial · 3.0mm · 1.41mm/px · z∈[-156,+153]mm · 3 of 104 slices shown]
[im 1/104]
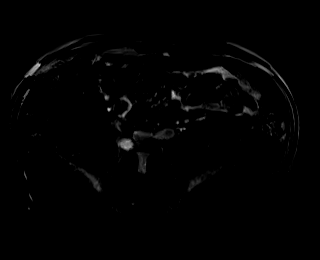
[im 52/104]
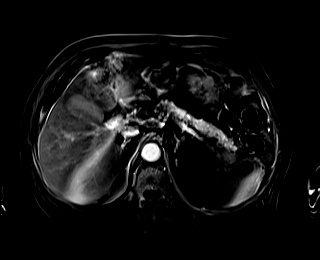
[im 104/104]
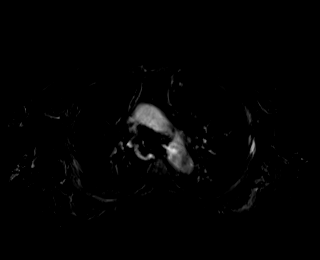

[Series 103: 3 min · axial · 3.0mm · 1.41mm/px · z∈[-156,+153]mm · 3 of 104 slices shown]
[im 1/104]
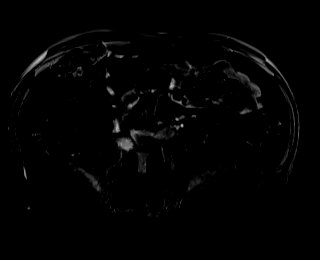
[im 52/104]
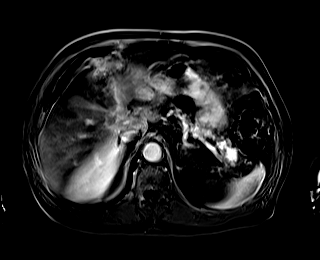
[im 104/104]
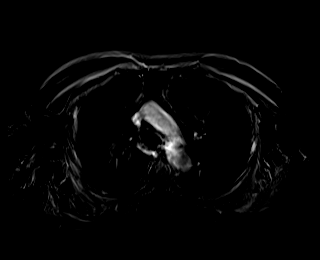

[48 of 48 positions shown; findings below may reference images not displayed]

FINDINGS: Lower chest: No acute findings.

Hepatobiliary: No hepatic masses identified. Gallbladder is
unremarkable. No evidence of biliary ductal dilatation.

Pancreas:  No mass or inflammatory changes.

Spleen:  Within normal limits in size and appearance.

Adrenals/Urinary Tract: Normal adrenal glands and right kidney.
Several small simple Bosniak category 1 renal cysts are seen in the
left kidney. A 3.2 by 2.2 cm cystic lesion is seen in the medial
interpolar region of the left kidney which shows moderate T1
hyperintensity, a few thin internal septations, but no evidence of
contrast enhancement or soft tissue nodularity. This is consistent
with a benign Bosniak category 2 cyst. No evidence of solid or
enhancing renal mass or hydronephrosis.

Stomach/Bowel: Visualized portion unremarkable.

Vascular/Lymphatic: No pathologically enlarged lymph nodes
identified. No abdominal aortic aneurysm.

Other:  None.

Musculoskeletal:  No suspicious bone lesions identified.
IMPRESSION: Small benign Bosniak category 1 and 2 cysts in left kidney. No
evidence of renal neoplasm or other significant abnormality.
